# Patient Record
Sex: Male | Born: 1935 | Race: White | Hispanic: No | Marital: Married | Smoking: Former smoker
Health system: Southern US, Community
[De-identification: ages and names within clinical notes are randomized; demographics above are authoritative.]

## PROBLEM LIST (undated history)

## (undated) DIAGNOSIS — E119 Type 2 diabetes mellitus without complications: Secondary | ICD-10-CM

## (undated) DIAGNOSIS — S7290XA Unspecified fracture of unspecified femur, initial encounter for closed fracture: Secondary | ICD-10-CM

## (undated) DIAGNOSIS — F329 Major depressive disorder, single episode, unspecified: Secondary | ICD-10-CM

## (undated) DIAGNOSIS — I10 Essential (primary) hypertension: Secondary | ICD-10-CM

## (undated) DIAGNOSIS — I639 Cerebral infarction, unspecified: Secondary | ICD-10-CM

## (undated) DIAGNOSIS — G4730 Sleep apnea, unspecified: Secondary | ICD-10-CM

## (undated) DIAGNOSIS — R0600 Dyspnea, unspecified: Secondary | ICD-10-CM

## (undated) DIAGNOSIS — I2699 Other pulmonary embolism without acute cor pulmonale: Secondary | ICD-10-CM

## (undated) DIAGNOSIS — F32A Depression, unspecified: Secondary | ICD-10-CM

## (undated) DIAGNOSIS — K219 Gastro-esophageal reflux disease without esophagitis: Secondary | ICD-10-CM

## (undated) DIAGNOSIS — E785 Hyperlipidemia, unspecified: Secondary | ICD-10-CM

## (undated) DIAGNOSIS — N289 Disorder of kidney and ureter, unspecified: Secondary | ICD-10-CM

## (undated) HISTORY — PX: EYE SURGERY: SHX253

## (undated) HISTORY — DX: Unspecified fracture of unspecified femur, initial encounter for closed fracture: S72.90XA

## (undated) HISTORY — PX: ADRENAL GLAND SURGERY: SHX544

## (undated) HISTORY — PX: AORTA SURGERY: SHX548

## (undated) HISTORY — PX: VASCULAR SURGERY: SHX849

## (undated) HISTORY — PX: CHOLECYSTECTOMY: SHX55

## (undated) HISTORY — PX: CATARACT EXTRACTION W/ INTRAOCULAR LENS  IMPLANT, BILATERAL: SHX1307

## (undated) HISTORY — PX: HIP FRACTURE SURGERY: SHX118

## (undated) HISTORY — PX: PROSTATE SURGERY: SHX751

## (undated) HISTORY — PX: JOINT REPLACEMENT: SHX530

## (undated) HISTORY — PX: FEMUR FRACTURE SURGERY: SHX633

## (undated) HISTORY — PX: REPLACEMENT TOTAL KNEE: SUR1224

## (undated) HISTORY — PX: CAROTID ARTERY - SUBCLAVIAN ARTERY BYPASS GRAFT: SUR178

---

## 2004-07-12 ENCOUNTER — Ambulatory Visit: Payer: Self-pay | Admitting: Internal Medicine

## 2004-08-14 ENCOUNTER — Ambulatory Visit: Payer: Self-pay | Admitting: Specialist

## 2004-08-15 ENCOUNTER — Ambulatory Visit: Payer: Self-pay | Admitting: Internal Medicine

## 2004-09-29 ENCOUNTER — Ambulatory Visit: Payer: Self-pay | Admitting: Internal Medicine

## 2004-09-30 ENCOUNTER — Ambulatory Visit: Payer: Self-pay | Admitting: Internal Medicine

## 2004-10-12 ENCOUNTER — Ambulatory Visit: Payer: Self-pay | Admitting: Internal Medicine

## 2004-11-12 ENCOUNTER — Ambulatory Visit: Payer: Self-pay | Admitting: Internal Medicine

## 2004-11-25 ENCOUNTER — Ambulatory Visit: Payer: Self-pay | Admitting: Internal Medicine

## 2004-12-10 ENCOUNTER — Ambulatory Visit: Payer: Self-pay | Admitting: Internal Medicine

## 2004-12-22 ENCOUNTER — Ambulatory Visit: Payer: Self-pay | Admitting: Internal Medicine

## 2004-12-23 ENCOUNTER — Ambulatory Visit: Payer: Self-pay | Admitting: Internal Medicine

## 2005-01-10 ENCOUNTER — Ambulatory Visit: Payer: Self-pay | Admitting: Internal Medicine

## 2005-02-09 ENCOUNTER — Ambulatory Visit: Payer: Self-pay | Admitting: Internal Medicine

## 2005-03-12 ENCOUNTER — Ambulatory Visit: Payer: Self-pay | Admitting: Internal Medicine

## 2005-04-11 ENCOUNTER — Ambulatory Visit: Payer: Self-pay | Admitting: Internal Medicine

## 2005-05-12 ENCOUNTER — Ambulatory Visit: Payer: Self-pay | Admitting: Internal Medicine

## 2005-06-12 ENCOUNTER — Ambulatory Visit: Payer: Self-pay | Admitting: Internal Medicine

## 2005-07-16 ENCOUNTER — Ambulatory Visit: Payer: Self-pay | Admitting: Internal Medicine

## 2005-08-12 ENCOUNTER — Ambulatory Visit: Payer: Self-pay | Admitting: Internal Medicine

## 2005-09-11 ENCOUNTER — Ambulatory Visit: Payer: Self-pay | Admitting: Internal Medicine

## 2005-10-12 ENCOUNTER — Ambulatory Visit: Payer: Self-pay | Admitting: Internal Medicine

## 2005-11-12 ENCOUNTER — Ambulatory Visit: Payer: Self-pay | Admitting: Internal Medicine

## 2005-12-10 ENCOUNTER — Ambulatory Visit: Payer: Self-pay | Admitting: Internal Medicine

## 2006-02-02 ENCOUNTER — Ambulatory Visit: Payer: Self-pay | Admitting: Internal Medicine

## 2006-02-09 ENCOUNTER — Ambulatory Visit: Payer: Self-pay | Admitting: Internal Medicine

## 2006-03-04 ENCOUNTER — Ambulatory Visit: Payer: Self-pay | Admitting: Internal Medicine

## 2006-03-30 ENCOUNTER — Ambulatory Visit: Payer: Self-pay | Admitting: Internal Medicine

## 2006-04-11 ENCOUNTER — Ambulatory Visit: Payer: Self-pay | Admitting: Internal Medicine

## 2006-05-14 ENCOUNTER — Ambulatory Visit: Payer: Self-pay | Admitting: Internal Medicine

## 2006-05-18 ENCOUNTER — Ambulatory Visit: Payer: Self-pay | Admitting: Internal Medicine

## 2006-06-12 ENCOUNTER — Ambulatory Visit: Payer: Self-pay | Admitting: Internal Medicine

## 2006-07-13 ENCOUNTER — Ambulatory Visit: Payer: Self-pay | Admitting: Internal Medicine

## 2006-08-12 ENCOUNTER — Ambulatory Visit: Payer: Self-pay | Admitting: Internal Medicine

## 2006-09-11 ENCOUNTER — Ambulatory Visit: Payer: Self-pay | Admitting: Internal Medicine

## 2006-10-12 ENCOUNTER — Ambulatory Visit: Payer: Self-pay | Admitting: Internal Medicine

## 2006-11-12 ENCOUNTER — Ambulatory Visit: Payer: Self-pay | Admitting: Internal Medicine

## 2007-01-06 ENCOUNTER — Ambulatory Visit: Payer: Self-pay | Admitting: Internal Medicine

## 2007-01-11 ENCOUNTER — Ambulatory Visit: Payer: Self-pay | Admitting: Internal Medicine

## 2007-02-10 ENCOUNTER — Ambulatory Visit: Payer: Self-pay | Admitting: Internal Medicine

## 2007-02-15 ENCOUNTER — Ambulatory Visit: Payer: Self-pay | Admitting: Internal Medicine

## 2007-03-29 ENCOUNTER — Ambulatory Visit: Payer: Self-pay | Admitting: Internal Medicine

## 2007-04-02 ENCOUNTER — Other Ambulatory Visit: Payer: Self-pay

## 2007-04-02 ENCOUNTER — Emergency Department: Payer: Self-pay

## 2007-04-12 ENCOUNTER — Ambulatory Visit: Payer: Self-pay | Admitting: Internal Medicine

## 2007-05-13 ENCOUNTER — Ambulatory Visit: Payer: Self-pay | Admitting: Internal Medicine

## 2007-07-05 ENCOUNTER — Ambulatory Visit: Payer: Self-pay | Admitting: Internal Medicine

## 2007-07-11 ENCOUNTER — Ambulatory Visit: Payer: Self-pay | Admitting: Internal Medicine

## 2007-07-13 ENCOUNTER — Ambulatory Visit: Payer: Self-pay | Admitting: Internal Medicine

## 2007-08-16 ENCOUNTER — Ambulatory Visit: Payer: Self-pay | Admitting: Internal Medicine

## 2007-09-12 ENCOUNTER — Ambulatory Visit: Payer: Self-pay | Admitting: Internal Medicine

## 2007-09-21 ENCOUNTER — Ambulatory Visit: Payer: Self-pay | Admitting: Surgery

## 2007-09-27 ENCOUNTER — Ambulatory Visit: Payer: Self-pay | Admitting: Surgery

## 2007-10-18 ENCOUNTER — Ambulatory Visit: Payer: Self-pay | Admitting: Internal Medicine

## 2007-11-13 ENCOUNTER — Ambulatory Visit: Payer: Self-pay | Admitting: Internal Medicine

## 2008-01-11 ENCOUNTER — Ambulatory Visit: Payer: Self-pay | Admitting: Internal Medicine

## 2008-02-10 ENCOUNTER — Ambulatory Visit: Payer: Self-pay | Admitting: Internal Medicine

## 2008-02-22 ENCOUNTER — Ambulatory Visit: Payer: Self-pay | Admitting: Internal Medicine

## 2008-03-12 ENCOUNTER — Ambulatory Visit: Payer: Self-pay | Admitting: Internal Medicine

## 2008-03-13 ENCOUNTER — Ambulatory Visit: Payer: Self-pay | Admitting: Internal Medicine

## 2008-04-11 ENCOUNTER — Ambulatory Visit: Payer: Self-pay | Admitting: Internal Medicine

## 2008-05-12 ENCOUNTER — Ambulatory Visit: Payer: Self-pay | Admitting: Internal Medicine

## 2008-06-07 ENCOUNTER — Ambulatory Visit: Payer: Self-pay | Admitting: Internal Medicine

## 2008-07-03 ENCOUNTER — Ambulatory Visit: Payer: Self-pay | Admitting: Internal Medicine

## 2008-07-12 ENCOUNTER — Ambulatory Visit: Payer: Self-pay | Admitting: Internal Medicine

## 2008-08-12 ENCOUNTER — Ambulatory Visit: Payer: Self-pay | Admitting: Internal Medicine

## 2008-09-04 ENCOUNTER — Ambulatory Visit: Payer: Self-pay | Admitting: Internal Medicine

## 2008-09-11 ENCOUNTER — Ambulatory Visit: Payer: Self-pay | Admitting: Internal Medicine

## 2008-11-12 ENCOUNTER — Ambulatory Visit: Payer: Self-pay | Admitting: Internal Medicine

## 2008-11-20 ENCOUNTER — Ambulatory Visit: Payer: Self-pay | Admitting: Physician Assistant

## 2008-12-03 ENCOUNTER — Ambulatory Visit: Payer: Self-pay | Admitting: Internal Medicine

## 2008-12-10 ENCOUNTER — Ambulatory Visit: Payer: Self-pay | Admitting: Internal Medicine

## 2009-01-10 ENCOUNTER — Ambulatory Visit: Payer: Self-pay | Admitting: Internal Medicine

## 2009-01-31 ENCOUNTER — Ambulatory Visit: Payer: Self-pay | Admitting: Internal Medicine

## 2009-02-09 ENCOUNTER — Ambulatory Visit: Payer: Self-pay | Admitting: Internal Medicine

## 2009-02-14 ENCOUNTER — Ambulatory Visit: Payer: Self-pay | Admitting: Internal Medicine

## 2009-02-27 ENCOUNTER — Ambulatory Visit: Payer: Self-pay | Admitting: Urology

## 2009-03-12 ENCOUNTER — Ambulatory Visit: Payer: Self-pay | Admitting: Internal Medicine

## 2009-04-11 ENCOUNTER — Ambulatory Visit: Payer: Self-pay | Admitting: Internal Medicine

## 2009-05-12 ENCOUNTER — Ambulatory Visit: Payer: Self-pay | Admitting: Internal Medicine

## 2009-05-23 ENCOUNTER — Ambulatory Visit: Payer: Self-pay | Admitting: Internal Medicine

## 2010-05-27 ENCOUNTER — Ambulatory Visit: Payer: Self-pay | Admitting: Internal Medicine

## 2011-06-02 ENCOUNTER — Ambulatory Visit: Payer: Self-pay | Admitting: Internal Medicine

## 2011-12-12 ENCOUNTER — Other Ambulatory Visit: Payer: Self-pay | Admitting: Internal Medicine

## 2011-12-12 LAB — PROTIME-INR
INR: 3.6
Prothrombin Time: 35.8 secs — ABNORMAL HIGH (ref 11.5–14.7)

## 2012-02-12 ENCOUNTER — Ambulatory Visit: Payer: Self-pay | Admitting: Nephrology

## 2012-05-10 ENCOUNTER — Other Ambulatory Visit: Payer: Self-pay | Admitting: Podiatry

## 2012-05-10 LAB — BODY FLUID CELL COUNT WITH DIFFERENTIAL
Basophil: 0 %
Eosinophil: 0 %
Lymphocytes: 3 %
Neutrophils: 86 %
Nucleated Cell Count: 17753 /mm3
Other Cells BF: 0 %
Other Mononuclear Cells: 11 %

## 2012-05-10 LAB — SYNOVIAL FLUID, CRYSTAL

## 2012-06-08 ENCOUNTER — Ambulatory Visit: Payer: Self-pay | Admitting: Internal Medicine

## 2012-11-15 ENCOUNTER — Ambulatory Visit: Payer: Self-pay | Admitting: General Practice

## 2012-11-21 ENCOUNTER — Ambulatory Visit: Payer: Self-pay | Admitting: Orthopedic Surgery

## 2012-11-21 LAB — SEDIMENTATION RATE: Erythrocyte Sed Rate: 8 mm/hr (ref 0–20)

## 2012-11-21 LAB — BASIC METABOLIC PANEL
BUN: 48 mg/dL — ABNORMAL HIGH (ref 7–18)
Creatinine: 2.1 mg/dL — ABNORMAL HIGH (ref 0.60–1.30)
EGFR (African American): 34 — ABNORMAL LOW

## 2012-11-21 LAB — CBC
HCT: 44.4 % (ref 40.0–52.0)
HGB: 15 g/dL (ref 13.0–18.0)
MCHC: 33.8 g/dL (ref 32.0–36.0)
MCV: 90 fL (ref 80–100)
RBC: 4.96 10*6/uL (ref 4.40–5.90)
WBC: 11.2 10*3/uL — ABNORMAL HIGH (ref 3.8–10.6)

## 2012-12-08 ENCOUNTER — Inpatient Hospital Stay: Payer: Self-pay | Admitting: Orthopedic Surgery

## 2012-12-08 LAB — PROTIME-INR: Prothrombin Time: 13.1 secs (ref 11.5–14.7)

## 2012-12-08 LAB — POTASSIUM: Potassium: 4.3 mmol/L (ref 3.5–5.1)

## 2012-12-09 LAB — PROTIME-INR
INR: 1.1
Prothrombin Time: 13.9 secs (ref 11.5–14.7)

## 2012-12-09 LAB — BASIC METABOLIC PANEL
Anion Gap: 5 — ABNORMAL LOW (ref 7–16)
BUN: 26 mg/dL — ABNORMAL HIGH (ref 7–18)
Calcium, Total: 8.2 mg/dL — ABNORMAL LOW (ref 8.5–10.1)
Chloride: 104 mmol/L (ref 98–107)
Co2: 30 mmol/L (ref 21–32)
EGFR (Non-African Amer.): 41 — ABNORMAL LOW
Osmolality: 285 (ref 275–301)
Sodium: 139 mmol/L (ref 136–145)

## 2012-12-10 LAB — CBC WITH DIFFERENTIAL/PLATELET
Eosinophil %: 0.8 %
HCT: 30.4 % — ABNORMAL LOW (ref 40.0–52.0)
HGB: 10.4 g/dL — ABNORMAL LOW (ref 13.0–18.0)
Lymphocyte %: 10.5 %
MCH: 31.2 pg (ref 26.0–34.0)
Monocyte %: 12 %
Neutrophil %: 76.4 %
Platelet: 108 10*3/uL — ABNORMAL LOW (ref 150–440)
RDW: 14.4 % (ref 11.5–14.5)

## 2012-12-10 LAB — BASIC METABOLIC PANEL
Anion Gap: 6 — ABNORMAL LOW (ref 7–16)
BUN: 29 mg/dL — ABNORMAL HIGH (ref 7–18)
Calcium, Total: 8.3 mg/dL — ABNORMAL LOW (ref 8.5–10.1)
Creatinine: 1.84 mg/dL — ABNORMAL HIGH (ref 0.60–1.30)
EGFR (African American): 40 — ABNORMAL LOW
EGFR (Non-African Amer.): 34 — ABNORMAL LOW
Sodium: 139 mmol/L (ref 136–145)

## 2012-12-10 LAB — PROTIME-INR
INR: 1.4
Prothrombin Time: 17 secs — ABNORMAL HIGH (ref 11.5–14.7)

## 2012-12-11 LAB — PROTIME-INR
INR: 1.7
Prothrombin Time: 20.1 secs — ABNORMAL HIGH (ref 11.5–14.7)

## 2012-12-12 LAB — BASIC METABOLIC PANEL
Anion Gap: 6 — ABNORMAL LOW (ref 7–16)
BUN: 36 mg/dL — ABNORMAL HIGH (ref 7–18)
Calcium, Total: 8.6 mg/dL (ref 8.5–10.1)
Chloride: 103 mmol/L (ref 98–107)
Co2: 28 mmol/L (ref 21–32)
EGFR (African American): 42 — ABNORMAL LOW
EGFR (Non-African Amer.): 36 — ABNORMAL LOW
Glucose: 130 mg/dL — ABNORMAL HIGH (ref 65–99)
Potassium: 4.4 mmol/L (ref 3.5–5.1)

## 2012-12-12 LAB — PROTIME-INR
INR: 2.3
Prothrombin Time: 24.5 secs — ABNORMAL HIGH (ref 11.5–14.7)

## 2012-12-12 LAB — CBC WITH DIFFERENTIAL/PLATELET
Basophil #: 0 10*3/uL (ref 0.0–0.1)
Eosinophil #: 0.1 10*3/uL (ref 0.0–0.7)
Eosinophil %: 1.3 %
HCT: 31.1 % — ABNORMAL LOW (ref 40.0–52.0)
Lymphocyte #: 0.9 10*3/uL — ABNORMAL LOW (ref 1.0–3.6)
MCH: 30.9 pg (ref 26.0–34.0)
MCV: 91 fL (ref 80–100)
Monocyte #: 1.1 x10 3/mm — ABNORMAL HIGH (ref 0.2–1.0)
Neutrophil %: 79.7 %
Platelet: 161 10*3/uL (ref 150–440)
RBC: 3.41 10*6/uL — ABNORMAL LOW (ref 4.40–5.90)

## 2012-12-13 ENCOUNTER — Encounter: Payer: Self-pay | Admitting: Internal Medicine

## 2014-03-06 ENCOUNTER — Ambulatory Visit: Payer: Self-pay | Admitting: Physical Medicine and Rehabilitation

## 2014-10-22 ENCOUNTER — Ambulatory Visit: Payer: Self-pay | Admitting: Orthopedic Surgery

## 2015-02-01 NOTE — Discharge Summary (Signed)
PATIENT NAME:  Gary Thomas, Gary Thomas MR#:  [id number] DATE OF BIRTH:  12/15/1931  DATE OF ADMISSION:  12/08/2012 DATE OF DISCHARGE:  12/12/2012  ADMITTING DIAGNOSIS: Right knee degenerative arthrosis.   DISCHARGE DIAGNOSES: Right knee degenerative arthrosis.  OPERATION: On 12/08/2012 the patient had right total knee replacement.   ANESTHESIA: Spinal.   SURGEON: Kennedy Bucker, M.D.   ASSISTANT: Cranston Neighbor, PA-C.   ESTIMATED BLOOD LOSS: 50 mL.  TOURNIQUET TIME: 64 minutes.   IMPLANTS: Medacta GMK primary total knee system, 5 right standard femur, 5 right fixed bearing tibial component, a 10 mm UC size 5 tibial insert and a size 3 patella.   The patient was stabilized and brought to the recovery room and then brought down to the orthopedic floor for pain control and physical therapy.   HISTORY OF PRESENT ILLNESS: The patient is a 81 year old male who presented for right knee arthritis with medial joint collapse. The patient has had difficulty with walking and activities of daily living. The patient has had multiple injections without any relief.   PHYSICAL EXAMINATION: GENERAL: Alert male with some difficulty ambulating. The patient does have range of motion of 5 degrees from extension to 100 degrees of flexion. The patient has mild fullness and difficulty with manipulation of the knee.   HOSPITAL COURSE: After initial admission on 12/08/2012, the patient was brought to the orthopedic floor. The patient has a urostomy and Foley, which was left in place. On postop day 1, the patient's hemoglobin was 12.3. On postop day 2, the patient's hemoglobin dropped down to 10.4 and remained stable there with no transfusions given. The patient did have significant nausea and vomiting secondary to oxycodone. The patient was placed on Anzemet as well as was given scopolamine patches and seemed to tolerate these well, as well as with change to Nucynta. The patient ambulated only bed to chair, up to about 3  feet, and was progressing slowly with physical therapy. The patient went to skilled nursing on 12/12/2012 with no other complications.   DISCHARGE INSTRUCTIONS:  The patient will follow up with [facility] in about 2 weeks for staple removal. The patient will have dressing change on a p.r.n. basis. The patient will do weight bear as tolerated. The patient will use 1 to 2 pillows for elevating the leg. The patient will use thigh-high TED hose on both legs, removed 1 hour before the 8 hour shift. The patient will be encouraged to do cough and deep breathing. The patient will do a regular diet. The patient will use Polar Care and try to decrease temperature to 40 to 50 degrees. The patient will try to keep the dressing clean and dry. Dressing will be changed on a p.r.n. basis. They will watch for any bright red along the incision, any bright red bleeding as well. The patient will call the clinic if there is any fever greater than 101.5 or any bowel or bladder difficulty, as per skilled nursing. The patient will do physical therapy per protocol and occupational therapy per protocol. The patient has oxygen, which they will continue to wean off. The patient is to follow up with Dr. Judithann Sheen in the office in 1 to 2 weeks.   DISCHARGE MEDICATIONS: 1.  Uloric 40 mg 1 tablet daily. 2.  Ocuvite antioxidant vitamin 1 capsule p.o. daily.  3.  Multivitamin with minerals 1 tablet p.o. daily.  4.  Tylenol 1 tablet q. 4 hours p.r.n. for pain or fever greater than 100.4.  5.  Nucynta 50 mg 1 tablet q. 4 hours as needed for pain.  6.  Magnesium hydroxide 8% oral suspension 30 mL b.i.d. p.r.n. for constipation.  7.  Aluminum hydroxide magnesium 30 mL q. 6 hours p.r.n. for indigestion. 8.  Warfarin 7.5 mg p.o. daily. 9.  Sertraline 50 mg at bedtime.  10.  Insulin regular 100 units/mL injectable insulin. 11.  Isophane insulin regular 70/30 units/mL subcutaneous. 12.  Ondansetron 4 mg p.o. tablets 1 tablet q.  4 to 6 hours p.r.n. for nausea. 13.  Scopolamine patch topically q. 3 days for nausea. 14.  Atorvastatin 20 mg p.o. daily.  15.  Ambien 5 mg at bedtime s needed for sleep depravation.  16.  Metoprolol 100 mg p.o. daily.  17.  Albuterol inhalation 2.5/0.5 mg inhaled. 18.  Furosemide 40 mg p.o. daily.  19.  Bisacodyl 10 mg per rectal suppository p.r.n. for constipation. ____________________________ Shela CommonsJ. Dedra Skeensodd Antonya Leeder, GeorgiaPA jtm:sb D: 12/12/2012 08:01:58 ET T: 12/12/2012 08:40:43 ET JOB#: [id number]  cc: J. Dedra Skeensodd Connery Shiffler, GeorgiaPA, <Dictator> J Other [facility]UN[facility] PA ELECTRONICALLY SIGNED 12/21/2012 13:23

## 2015-02-01 NOTE — Op Note (Signed)
  DATE OF BIRTH:  09-Feb-1932  DATE OF PROCEDURE:  12/08/2012  PREOPERATIVE DIAGNOSIS:  Right knee degenerative osteoarthritis.   POSTOPERATIVE DIAGNOSIS:  Right knee degenerative osteoarthritis.   PROCEDURE:  Right total knee replacement.   ANESTHESIA:  Spinal.   SURGEON: Kennedy BuckerMichael Kyah Buesing, MD   ASSISTANT:  Cranston Neighborhris Gaines, PA-C   DESCRIPTION OF PROCEDURE: The patient was brought to the Operating Room and after adequate anesthesia was obtained, the right leg was prepped and draped in the usual sterile fashion, with a tourniquet applied to the upper thigh. Alvarado leg holder was used during the procedure. After prepping and draping the leg in the usual sterile fashion, appropriate patient identification and timeout procedures were completed. The leg was exsanguinated with an  Esmarch, and the tourniquet raised to 300 mmHg. A midline skin incision was made, followed by a medial parapatellar arthrotomy. Inspection revealed exposed bone on the femoral condyle medially and tibial condyle, and significant degenerative change to the patellofemoral joint with exposed bone. The lateral compartment had moderate changes. The fat pad and ACL were excised along with the anterior horns of the meniscus. Proximal exposure carried out until the proximal cutting block could be applied, and the proximal tibia cut carried out, preserving the PCL. After this was completed, the posterior horns of the menisci were excised, and the femoral cutting block was applied. Anterior, posterior and chamfer cuts made without any notching. The 5 trial in the tibia was placed, pinning it in place, performing the proximal drill hole, and then the notch V cut in the proximal tibia for rotational stability. A 10 mm trial inserted, followed by placement of the #5 femoral component, with excellent stability in flexion and extension and mid flexion. The patella was then cut with a cutting guide, drilled and measured a size 3, with the 3 trialed.   The patella tracked well with no touch technique. At this point, the distal femoral drill holes were made along with the notch in the femoral trochlea. All trial components were removed. The bony surfaces thoroughly irrigated and dried. Local anesthetic was infiltrated in the posterior knee with long-acting local being given. The bony surfaces were thoroughly dried and the tibial component was cemented in place first, with the tibial polyethylene then placed, followed by the femoral component. The knee held in extension and the patellar button clamped into place with antibiotic cement being used with a history of diabetes. After the cement had set, all excess cement was removed and the knee again thoroughly irrigated. The tourniquet was let down. Hemostasis checked with electrocautery. The arthrotomy repaired using a heavy quill suture, 2-0 quill subcutaneously, skin staples. Xeroform, 4 x 4s, ABD, Webril, Polar Care and Ace wrap. The patient was sent to recovery room in stable condition.  EBL was 50 mL, tourniquet time was 64 minutes at 300 mmHg.  IMPLANTS:  Medacta GMK primary total knee system, 5 right standard femur, 5 right fixed bearing tibia component, a 10 mm UC size 5 tibial insert and a size 3 patella. Again, all components cemented with Exparel used as local anesthetic in the posterior capsule along the arthrotomy.   SPECIMEN: Cut ends of bone.   CONDITION:  To recovery room, stable.     ____________________________ Leitha SchullerMichael J. Shandie Bertz, MD mjm:mr D: 12/08/2012 17:00:43 ET T: 12/08/2012 20:44:32 ET JOB#: [id number]  cc: Leitha SchullerMichael J. Khyri Hinzman, MD, <Dictator> Leitha SchullerMICHAEL J Cranford Blessinger MD ELECTRONICALLY SIGNED 12/09/2012 8:12

## 2015-02-01 NOTE — Consult Note (Signed)
PATIENT NAME:  Gary Thomas, Gary Thomas MR#:  [id number] DATE OF BIRTH:  1931-12-07  DATE OF CONSULTATION:  12/08/2012  REFERRING PHYSICIAN:  Leitha Schuller, MD CONSULTING PHYSICIAN:  Demarquez Ciolek A. Allena Katz, MD PRIMARY CARE PHYSICIAN: Duane Lope. Judithann Sheen, MD  REASON FOR CONSULTATION: Postop medical management.   HISTORY OF PRESENT ILLNESS: The patient is a pleasant 81 year old, obese Caucasian gentleman with history of polycythemia, prostate cancer, type 2 diabetes, was admitted for elective right total knee replacement. He underwent right total knee replacement by Dr. Rosita Kea this afternoon secondary to severe degenerative joint disease. The patient tolerated the procedure well. Internal medicine was consulted for postop medical management along with management of diabetes. The patient's most recent sugar in the evening was 103. He ate a good supper. His blood glucose after surgery was 92. Denies any complaints other than pain on the surgical knee.   PAST MEDICAL HISTORY:  1.  Polycythemia vera.  2.  History of prostate cancer.  3.  History of CVA/stroke.  4.  Type 2 diabetes.  5.  History of A. fib, now resolved, in sinus rhythm.  6.  History of pulmonary embolus, on Coumadin. 7.  CKD stage III.  8.  Low testosterone level.  9.  Anxiety, depression.  10.  Bilateral hip fractures.  11.  Severe sleep apnea, status post uvulopalatopharyngoplasty/nasal and tongue pexy.   PAST SURGICAL HISTORY:  1.  Surgery for prostate cancer.  2.  Bilateral carotid artery surgery.  3.  Redo left and right carotid artery surgery.  4.  Appendectomy.  5.  Cholecystectomy.  6.  Status post UPPP secondary to severe sleep apnea.   MEDICATIONS:  1.  Uses BiPAP.  2.  Insulin 70/30, 30 units b.i.d.  3.  Ocuvite, lutein, antioxidant multivitamins and mineral oil capsule daily.  4.  Sertraline 50 mg at bedtime.  5.  Uloric 40 mg daily.  6.  Warfarin 7.5 mg daily in the evening.  7.  Metoprolol ER 100 mg daily.  8.  Furosemide  40 mg daily.  9.  Atorvastatin 20 mg at bedtime.   ALLERGIES: CODEINE, LYRICA, MORPHINE AND STADOL.   FAMILY HISTORY: Positive for hypertension.   SOCIAL HISTORY: Denies use of alcohol or tobacco use.   REVIEW OF SYSTEMS:    CONSTITUTIONAL: No fever, fatigue, weakness.  EYES: No blurred or double vision.  ENT: No tinnitus, ear pain, hearing loss.  RESPIRATORY: No cough, wheeze, hemoptysis.  CARDIOVASCULAR: No chest pain, orthopnea, edema.  GASTROINTESTINAL: No nausea, vomiting, diarrhea or abdominal pain.  GENITOURINARY: No dysuria or hematuria.  ENDOCRINE: No polyuria, nocturia or thyroid problems.  GENITOURINARY: No dysuria or hematuria. NEUROLOGIC: No CVA, TIA.  PSYCHIATRIC: No anxiety or depression.  SKIN: No rash or ecchymosis. All other systems reviewed and negative.   PHYSICAL EXAMINATION:  GENERAL: The patient is awake, alert, oriented x 3, not in acute distress.  VITAL SIGNS: Afebrile, pulse 65, blood pressure 160/83, sats 93% on 1 liter.  HEENT: Atraumatic, normocephalic. Pupils: PERRLA. EOM intact. Oral mucosa is moist.  NECK: Supple. No JVD. No carotid bruit.  RESPIRATORY: Clear to auscultation bilaterally. No rales, rhonchi, respiratory distress or labored breathing.  CARDIOVASCULAR: Both the heart sounds are normal. Rate and rhythm regular. PMI not lateralized. Chest nontender. Good pedal pulses, good femoral pulses. No lower extremity edema.  ABDOMEN: Soft, benign, nontender. No organomegaly. Positive bowel sounds.  EXTREMITIES: The patient's right lower extremity has surgical knee dressing present.  NEUROLOGIC: Grossly intact cranial nerves II through XII.  No motor or sensory deficits.  PSYCHIATRIC: The patient is awake, alert, oriented x 3.  SKIN: Warm and dry.   LABORATORY AND DIAGNOSTIC DATA: Postop glucose is 92 and 103. Potassium is 4.3. PT/INR is 13.1 and 1.0. EKG is normal sinus rhythm.   ASSESSMENT AND PLAN: A 81 year old patient with multiple medical  problems including history of hypertension, atrial fibrillation, history of pulmonary embolus, severe degenerative joint disease, is postoperative from right knee arthroplasty by Dr. Rosita KeaMenz which was done on February 27. Internal medicine is consulted for:  1.  Postoperative medical management of diabetes. The patient takes insulin 70/30, 30 units twice daily. Will start tomorrow morning along with sliding scale. Postoperative sugars have been well-controlled at 92 and 103 respectively. He follows up with Dr. Tedd SiasSolum as outpatient.  2.  Chronic anticoagulation due to history of pulmonary embolus. Coumadin has been resumed by orthopedics. Will check PT/INR daily.  3.  Chronic kidney disease stage III. Currently getting IV fluids. Follow up BMP in the morning. Avoid nephrotoxins and sudden drop in blood pressure.  4.  Hypertension. Stable at this time. Resumed metoprolol.  5.  Dyslipidemia. Continue atorvastatin.  6.  Deep vein prophylaxis. The patient is already on Coumadin.  7.  Further workup according to the patient's clinical course.   Thank you for the consult. Will follow while the patient is in-house. The patient's care will be assumed by Dr. Judithann SheenSparks in the morning.   TIME SPENT: 50 minutes.   ____________________________ Wylie HailSona A. Allena KatzPatel, MD sap:jm D: 12/08/2012 19:32:38 ET T: 12/08/2012 20:31:03 ET JOB#: [id number]  cc: Junell Cullifer A. Allena KatzPatel, MD, <Dictator> Willow OraSONA A Leba Tibbitts MD ELECTRONICALLY SIGNED 12/09/2012 12:49

## 2015-02-01 NOTE — Consult Note (Signed)
PCP Dr Dwaine GaleSparksDr Menz Post op medical mn of Dm-2takes insulin 70/30 30 units bidstart from tomorrow am with SSIwith dr Tedd Siassolum Chronic anticoagulation due to h/o PEresumed by ortho IVFBMP in amnephrotooxins HTN-on metoprolol atorvastatin  Electronic Signatures: Willow OraPatel, Asencion Loveday A (MD)  (Signed on 27-Feb-14 19:25)  Authored  Last Updated: 27-Feb-14 19:25 by Willow OraPatel, Nik Gorrell A (MD)

## 2015-12-27 ENCOUNTER — Other Ambulatory Visit: Payer: Self-pay | Admitting: Neurology

## 2015-12-27 DIAGNOSIS — Z8673 Personal history of transient ischemic attack (TIA), and cerebral infarction without residual deficits: Secondary | ICD-10-CM

## 2016-01-16 ENCOUNTER — Ambulatory Visit
Admission: RE | Admit: 2016-01-16 | Discharge: 2016-01-16 | Disposition: A | Discharge status: Home / Self Care | Payer: Medicare Other | Source: Ambulatory Visit | Attending: Neurology | Admitting: Neurology

## 2016-01-16 DIAGNOSIS — Z8673 Personal history of transient ischemic attack (TIA), and cerebral infarction without residual deficits: Secondary | ICD-10-CM | POA: Diagnosis present

## 2016-01-16 DIAGNOSIS — I6782 Cerebral ischemia: Secondary | ICD-10-CM | POA: Insufficient documentation

## 2016-01-16 DIAGNOSIS — G319 Degenerative disease of nervous system, unspecified: Secondary | ICD-10-CM | POA: Insufficient documentation

## 2016-01-16 DIAGNOSIS — R208 Other disturbances of skin sensation: Secondary | ICD-10-CM | POA: Diagnosis not present

## 2017-02-17 ENCOUNTER — Encounter
Admission: RE | Admit: 2017-02-17 | Discharge: 2017-02-17 | Disposition: A | Discharge status: Home / Self Care | Payer: Medicare Other | Source: Ambulatory Visit | Attending: Orthopedic Surgery | Admitting: Orthopedic Surgery

## 2017-02-17 DIAGNOSIS — Z01812 Encounter for preprocedural laboratory examination: Secondary | ICD-10-CM | POA: Insufficient documentation

## 2017-02-17 DIAGNOSIS — I10 Essential (primary) hypertension: Secondary | ICD-10-CM

## 2017-02-17 DIAGNOSIS — E7800 Pure hypercholesterolemia, unspecified: Secondary | ICD-10-CM | POA: Insufficient documentation

## 2017-02-17 DIAGNOSIS — Z01810 Encounter for preprocedural cardiovascular examination: Secondary | ICD-10-CM | POA: Diagnosis present

## 2017-02-17 DIAGNOSIS — K219 Gastro-esophageal reflux disease without esophagitis: Secondary | ICD-10-CM | POA: Insufficient documentation

## 2017-02-17 DIAGNOSIS — Z8673 Personal history of transient ischemic attack (TIA), and cerebral infarction without residual deficits: Secondary | ICD-10-CM | POA: Diagnosis not present

## 2017-02-17 DIAGNOSIS — Z86711 Personal history of pulmonary embolism: Secondary | ICD-10-CM | POA: Diagnosis not present

## 2017-02-17 DIAGNOSIS — E119 Type 2 diabetes mellitus without complications: Secondary | ICD-10-CM | POA: Insufficient documentation

## 2017-02-17 HISTORY — DX: Disorder of kidney and ureter, unspecified: N28.9

## 2017-02-17 HISTORY — DX: Gastro-esophageal reflux disease without esophagitis: K21.9

## 2017-02-17 HISTORY — DX: Other pulmonary embolism without acute cor pulmonale: I26.99

## 2017-02-17 HISTORY — DX: Depression, unspecified: F32.A

## 2017-02-17 HISTORY — DX: Type 2 diabetes mellitus without complications: E11.9

## 2017-02-17 HISTORY — DX: Hyperlipidemia, unspecified: E78.5

## 2017-02-17 HISTORY — DX: Essential (primary) hypertension: I10

## 2017-02-17 HISTORY — DX: Cerebral infarction, unspecified: I63.9

## 2017-02-17 HISTORY — DX: Major depressive disorder, single episode, unspecified: F32.9

## 2017-02-17 HISTORY — DX: Dyspnea, unspecified: R06.00

## 2017-02-17 LAB — BASIC METABOLIC PANEL
Anion gap: 7 (ref 5–15)
BUN: 38 mg/dL — AB (ref 6–20)
CO2: 27 mmol/L (ref 22–32)
Calcium: 9.4 mg/dL (ref 8.9–10.3)
Chloride: 104 mmol/L (ref 101–111)
Creatinine, Ser: 1.99 mg/dL — ABNORMAL HIGH (ref 0.61–1.24)
GFR calc Af Amer: 34 mL/min — ABNORMAL LOW (ref >60)
GFR calc non Af Amer: 29 mL/min — ABNORMAL LOW (ref >60)
GLUCOSE: 144 mg/dL — AB (ref 65–99)
POTASSIUM: 4.4 mmol/L (ref 3.5–5.1)
Sodium: 138 mmol/L (ref 135–145)

## 2017-02-17 LAB — URINALYSIS, COMPLETE (UACMP) WITH MICROSCOPIC
BILIRUBIN URINE: NEGATIVE
GLUCOSE, UA: NEGATIVE mg/dL
Hgb urine dipstick: NEGATIVE
KETONES UR: NEGATIVE mg/dL
NITRITE: NEGATIVE
PH: 8 (ref 5.0–8.0)
Protein, ur: 100 mg/dL — AB
Specific Gravity, Urine: 1.015 (ref 1.005–1.030)

## 2017-02-17 LAB — CBC
HCT: 40.9 % (ref 40.0–52.0)
HEMOGLOBIN: 13.5 g/dL (ref 13.0–18.0)
MCH: 28.3 pg (ref 26.0–34.0)
MCHC: 33.1 g/dL (ref 32.0–36.0)
MCV: 85.6 fL (ref 80.0–100.0)
PLATELETS: 171 10*3/uL (ref 150–440)
RBC: 4.78 MIL/uL (ref 4.40–5.90)
RDW: 14.7 % — AB (ref 11.5–14.5)
WBC: 10.1 10*3/uL (ref 3.8–10.6)

## 2017-02-17 LAB — APTT: aPTT: 31 seconds (ref 24–36)

## 2017-02-17 LAB — PROTIME-INR
INR: 1.24
Prothrombin Time: 15.7 seconds — ABNORMAL HIGH (ref 11.4–15.2)

## 2017-02-17 LAB — TYPE AND SCREEN
ABO/RH(D): O POS
Antibody Screen: NEGATIVE

## 2017-02-17 LAB — SEDIMENTATION RATE: SED RATE: 17 mm/h (ref 0–20)

## 2017-02-17 NOTE — Patient Instructions (Signed)
Your procedure is scheduled on: 03/02/17 Tues Report to Same Day Surgery 2nd floor medical mall [facility](Medical Mall Entrance-take elevator on left to 2nd floor.  Check in with surgery information desk.) To find out your arrival time please call [phone] between 1PM - 3PM on 03/01/17 Mon  Remember: Instructions that are not followed completely may result in serious medical risk, up to and including death, or upon the discretion of your surgeon and anesthesiologist your surgery may need to be rescheduled.    _x___ 1. Do not eat food or drink liquids after midnight. No gum chewing or                              hard candies.     __x__ 2. No Alcohol for 24 hours before or after surgery.   __x__3. No Smoking for 24 prior to surgery.   ____  4. Bring all medications with you on the day of surgery if instructed.    __x__ 5. Notify your doctor if there is any change in your medical condition     (cold, fever, infections).     Do not wear jewelry, make-up, hairpins, clips or nail polish.  Do not wear lotions, powders, or perfumes. You may wear deodorant.  Do not shave 48 hours prior to surgery. Men may shave face and neck.  Do not bring valuables to the hospital.    [facility] any belongings or valuables.               Contacts, dentures or bridgework may not be worn into surgery.  Leave your suitcase in the car. After surgery it may be brought to your room.  For patients admitted to the hospital, discharge time is determined by your                       treatment team.   Patients discharged the day of surgery will not be allowed to drive home.  You will need someone to drive you home and stay with you the night of your procedure.    Please read over the following fact sheets that you were given:   [facility] Preparing [facility] and or MRSA Information   _x___ Take anti-hypertensive (unless it includes a diuretic), cardiac, seizure, asthma,     anti-reflux and  psychiatric medicines. These include:  1.   2.  3.  4.  5.  6.  ____Fleets enema or Magnesium Citrate as directed.   _x___ Use CHG Soap or sage wipes as directed on instruction sheet   ____ Use inhalers on the day of surgery and bring to hospital day of surgery  ____ Stop Metformin and Janumet 2 days prior to surgery.    ____ Take 1/2 of usual insulin dose the night before surgery and none on the morning     surgery.   _x___ Follow recommendations from Cardiologist, Pulmonologist or PCP regarding          stopping Aspirin, Coumadin, Pllavix ,Eliquis, Effient, or Pradaxa, and Pletal. Stop Xarelto 3 days before surgery if OK with physician  X____Stop Anti-inflammatories such as Advil, Aleve, Ibuprofen, Motrin, Naproxen, Naprosyn, Goodies powders or aspirin products. OK to take Tylenol and                          Celebrex.   _x___ Stop supplements until  after surgery.  But may continue Vitamin D, Vitamin B,       and multivitamin.   __x__ Bring C-Pap to the hospital.

## 2017-02-18 NOTE — Pre-Procedure Instructions (Addendum)
Unable to reach patient,  phone number no longer active.  Called [facility] and obtained another number.  Patient notified of need for MRSA nasal swab.

## 2017-02-19 ENCOUNTER — Encounter
Admission: RE | Admit: 2017-02-19 | Discharge: 2017-02-19 | Disposition: A | Discharge status: Home / Self Care | Payer: Medicare Other | Source: Ambulatory Visit | Attending: Orthopedic Surgery | Admitting: Orthopedic Surgery

## 2017-02-19 DIAGNOSIS — Z01812 Encounter for preprocedural laboratory examination: Secondary | ICD-10-CM | POA: Diagnosis not present

## 2017-02-19 LAB — URINE CULTURE

## 2017-02-19 LAB — SURGICAL PCR SCREEN
MRSA, PCR: NEGATIVE
Staphylococcus aureus: NEGATIVE

## 2017-02-22 NOTE — Pre-Procedure Instructions (Signed)
[facility]

## 2017-03-01 MED ORDER — CEFAZOLIN SODIUM-DEXTROSE 2-4 GM/100ML-% IV SOLN: 2.0000 g | Freq: Once | INTRAVENOUS | Status: DC

## 2017-03-02 ENCOUNTER — Inpatient Hospital Stay
Admission: RE | Admit: 2017-03-02 | Discharge: 2017-03-05 | DRG: 470 | Disposition: A | Discharge status: Skilled Nursing Facility | Payer: Medicare Other | Source: Ambulatory Visit | Attending: Orthopedic Surgery | Admitting: Orthopedic Surgery

## 2017-03-02 ENCOUNTER — Inpatient Hospital Stay: Admission: RE | Admit: 2017-03-02 | Payer: Medicare Other | Source: Ambulatory Visit | Admitting: Orthopedic Surgery

## 2017-03-02 ENCOUNTER — Encounter
Admission: RE | Disposition: A | Discharge status: Skilled Nursing Facility | Payer: Self-pay | Source: Ambulatory Visit | Attending: Orthopedic Surgery

## 2017-03-02 ENCOUNTER — Encounter: Payer: Self-pay | Admitting: *Deleted

## 2017-03-02 ENCOUNTER — Inpatient Hospital Stay: Payer: Medicare Other

## 2017-03-02 ENCOUNTER — Inpatient Hospital Stay: Payer: Medicare Other | Admitting: Anesthesiology

## 2017-03-02 ENCOUNTER — Encounter: Admission: RE | Payer: Self-pay | Source: Ambulatory Visit

## 2017-03-02 DIAGNOSIS — E119 Type 2 diabetes mellitus without complications: Secondary | ICD-10-CM | POA: Diagnosis present

## 2017-03-02 DIAGNOSIS — Z8673 Personal history of transient ischemic attack (TIA), and cerebral infarction without residual deficits: Secondary | ICD-10-CM

## 2017-03-02 DIAGNOSIS — Z86711 Personal history of pulmonary embolism: Secondary | ICD-10-CM | POA: Diagnosis not present

## 2017-03-02 DIAGNOSIS — E875 Hyperkalemia: Secondary | ICD-10-CM | POA: Diagnosis present

## 2017-03-02 DIAGNOSIS — I10 Essential (primary) hypertension: Secondary | ICD-10-CM | POA: Diagnosis present

## 2017-03-02 DIAGNOSIS — I959 Hypotension, unspecified: Secondary | ICD-10-CM | POA: Diagnosis present

## 2017-03-02 DIAGNOSIS — F329 Major depressive disorder, single episode, unspecified: Secondary | ICD-10-CM | POA: Diagnosis present

## 2017-03-02 DIAGNOSIS — M1611 Unilateral primary osteoarthritis, right hip: Secondary | ICD-10-CM | POA: Diagnosis present

## 2017-03-02 DIAGNOSIS — D62 Acute posthemorrhagic anemia: Secondary | ICD-10-CM | POA: Diagnosis not present

## 2017-03-02 DIAGNOSIS — Z419 Encounter for procedure for purposes other than remedying health state, unspecified: Secondary | ICD-10-CM

## 2017-03-02 DIAGNOSIS — G8918 Other acute postprocedural pain: Secondary | ICD-10-CM

## 2017-03-02 DIAGNOSIS — K219 Gastro-esophageal reflux disease without esophagitis: Secondary | ICD-10-CM | POA: Diagnosis present

## 2017-03-02 HISTORY — PX: TOTAL HIP ARTHROPLASTY: SHX124

## 2017-03-02 LAB — CBC
HCT: 36.4 % — ABNORMAL LOW (ref 40.0–52.0)
Hemoglobin: 11.8 g/dL — ABNORMAL LOW (ref 13.0–18.0)
MCH: 27.5 pg (ref 26.0–34.0)
MCHC: 32.5 g/dL (ref 32.0–36.0)
MCV: 84.8 fL (ref 80.0–100.0)
PLATELETS: 156 10*3/uL (ref 150–440)
RBC: 4.29 MIL/uL — ABNORMAL LOW (ref 4.40–5.90)
RDW: 14.7 % — AB (ref 11.5–14.5)
WBC: 13.4 10*3/uL — AB (ref 3.8–10.6)

## 2017-03-02 LAB — CREATININE, SERUM
Creatinine, Ser: 1.66 mg/dL — ABNORMAL HIGH (ref 0.61–1.24)
GFR calc Af Amer: 42 mL/min — ABNORMAL LOW (ref >60)
GFR calc non Af Amer: 37 mL/min — ABNORMAL LOW (ref >60)

## 2017-03-02 LAB — ABO/RH: ABO/RH(D): O POS

## 2017-03-02 LAB — GLUCOSE, CAPILLARY
GLUCOSE-CAPILLARY: 131 mg/dL — AB (ref 65–99)
Glucose-Capillary: 123 mg/dL — ABNORMAL HIGH (ref 65–99)
Glucose-Capillary: 97 mg/dL (ref 65–99)

## 2017-03-02 SURGERY — ARTHROPLASTY, HIP, TOTAL, ANTERIOR APPROACH
Anesthesia: Spinal | Site: Hip | Laterality: Right | Wound class: Clean

## 2017-03-02 SURGERY — ARTHROPLASTY, HIP, TOTAL, ANTERIOR APPROACH
Anesthesia: Choice | Laterality: Right

## 2017-03-02 MED ORDER — METHOCARBAMOL 500 MG PO TABS: 500.0000 mg | ORAL_TABLET | Freq: Four times a day (QID) | ORAL | Status: DC | PRN

## 2017-03-02 MED ORDER — CEFAZOLIN SODIUM-DEXTROSE 2-4 GM/100ML-% IV SOLN: 2.0000 g | Freq: Four times a day (QID) | INTRAVENOUS | Status: DC

## 2017-03-02 MED ORDER — ALUMINUM HYDROXIDE GEL 320 MG/5ML PO SUSP
15.0000 mL | ORAL | Status: DC | PRN
  Filled 2017-03-02: qty 30

## 2017-03-02 MED ORDER — METOCLOPRAMIDE HCL 10 MG PO TABS
5.0000 mg | ORAL_TABLET | Freq: Three times a day (TID) | ORAL | Status: DC | PRN
  Administered 2017-03-03: 10 mg via ORAL
  Filled 2017-03-02: qty 1

## 2017-03-02 MED ORDER — METHOCARBAMOL 1000 MG/10ML IJ SOLN
500.0000 mg | Freq: Four times a day (QID) | INTRAVENOUS | Status: DC | PRN
  Filled 2017-03-02: qty 5

## 2017-03-02 MED ORDER — DIPHENHYDRAMINE HCL 12.5 MG/5ML PO ELIX: 12.5000 mg | ORAL_SOLUTION | ORAL | Status: DC | PRN

## 2017-03-02 MED ORDER — NEOMYCIN-POLYMYXIN B GU 40-200000 IR SOLN
Status: AC
  Filled 2017-03-02: qty 4

## 2017-03-02 MED ORDER — SODIUM CHLORIDE 0.9 % IV SOLN
INTRAVENOUS | Status: DC
  Administered 2017-03-02 – 2017-03-03 (×2): via INTRAVENOUS

## 2017-03-02 MED ORDER — CEFAZOLIN SODIUM-DEXTROSE 2-3 GM-% IV SOLR
INTRAVENOUS | Status: DC | PRN
  Administered 2017-03-02: 2 g via INTRAVENOUS

## 2017-03-02 MED ORDER — MAGNESIUM HYDROXIDE 400 MG/5ML PO SUSP
30.0000 mL | Freq: Every day | ORAL | Status: DC | PRN
  Administered 2017-03-04: 30 mL via ORAL
  Filled 2017-03-02: qty 30

## 2017-03-02 MED ORDER — HYDROMORPHONE HCL 1 MG/ML IJ SOLN
1.0000 mg | INTRAMUSCULAR | Status: DC | PRN
  Administered 2017-03-02 – 2017-03-03 (×3): 1 mg via INTRAVENOUS
  Filled 2017-03-02 (×3): qty 1

## 2017-03-02 MED ORDER — LOSARTAN POTASSIUM 25 MG PO TABS
25.0000 mg | ORAL_TABLET | Freq: Every evening | ORAL | Status: DC
  Administered 2017-03-02 – 2017-03-04 (×2): 25 mg via ORAL
  Filled 2017-03-02 (×2): qty 1

## 2017-03-02 MED ORDER — PROPOFOL 500 MG/50ML IV EMUL
INTRAVENOUS | Status: DC | PRN
  Administered 2017-03-02: 100 ug/kg/min via INTRAVENOUS

## 2017-03-02 MED ORDER — FENTANYL CITRATE (PF) 100 MCG/2ML IJ SOLN
INTRAMUSCULAR | Status: AC
  Filled 2017-03-02: qty 2

## 2017-03-02 MED ORDER — INSULIN ASPART 100 UNIT/ML ~~LOC~~ SOLN
0.0000 [IU] | Freq: Three times a day (TID) | SUBCUTANEOUS | Status: DC
  Administered 2017-03-03 (×2): 3 [IU] via SUBCUTANEOUS
  Administered 2017-03-03 – 2017-03-04 (×3): 2 [IU] via SUBCUTANEOUS
  Filled 2017-03-02: qty 3
  Filled 2017-03-02: qty 2
  Filled 2017-03-02: qty 3
  Filled 2017-03-02 (×2): qty 2

## 2017-03-02 MED ORDER — MIDAZOLAM HCL 5 MG/5ML IJ SOLN
INTRAMUSCULAR | Status: DC | PRN
  Administered 2017-03-02 (×2): 0.5 mg via INTRAVENOUS

## 2017-03-02 MED ORDER — NEOMYCIN-POLYMYXIN B GU 40-200000 IR SOLN
Status: DC | PRN
  Administered 2017-03-02: 4 mL

## 2017-03-02 MED ORDER — PHENYLEPHRINE HCL 10 MG/ML IJ SOLN
INTRAMUSCULAR | Status: DC | PRN
  Administered 2017-03-02: 100 ug via INTRAVENOUS
  Administered 2017-03-02: 200 ug via INTRAVENOUS
  Administered 2017-03-02: 100 ug via INTRAVENOUS
  Administered 2017-03-02 (×3): 50 ug via INTRAVENOUS
  Administered 2017-03-02 (×2): 100 ug via INTRAVENOUS

## 2017-03-02 MED ORDER — METOPROLOL SUCCINATE ER 50 MG PO TB24
100.0000 mg | ORAL_TABLET | Freq: Every evening | ORAL | Status: DC
  Administered 2017-03-02 – 2017-03-04 (×3): 100 mg via ORAL
  Filled 2017-03-02 (×3): qty 2

## 2017-03-02 MED ORDER — BUPIVACAINE-EPINEPHRINE (PF) 0.25% -1:200000 IJ SOLN
INTRAMUSCULAR | Status: AC
  Filled 2017-03-02: qty 30

## 2017-03-02 MED ORDER — METOCLOPRAMIDE HCL 5 MG/ML IJ SOLN: 5.0000 mg | Freq: Three times a day (TID) | INTRAMUSCULAR | Status: DC | PRN

## 2017-03-02 MED ORDER — PHENOL 1.4 % MT LIQD
1.0000 | OROMUCOSAL | Status: DC | PRN
  Filled 2017-03-02: qty 177

## 2017-03-02 MED ORDER — MAGNESIUM CITRATE PO SOLN
1.0000 | Freq: Once | ORAL | Status: DC | PRN
  Filled 2017-03-02: qty 296

## 2017-03-02 MED ORDER — ONDANSETRON HCL 4 MG/2ML IJ SOLN
4.0000 mg | Freq: Four times a day (QID) | INTRAMUSCULAR | Status: DC | PRN
  Administered 2017-03-03 – 2017-03-05 (×2): 4 mg via INTRAVENOUS
  Filled 2017-03-02 (×2): qty 2

## 2017-03-02 MED ORDER — SERTRALINE HCL 100 MG PO TABS
100.0000 mg | ORAL_TABLET | Freq: Every evening | ORAL | Status: DC
  Administered 2017-03-02 – 2017-03-04 (×3): 100 mg via ORAL
  Filled 2017-03-02 (×3): qty 1

## 2017-03-02 MED ORDER — DOCUSATE SODIUM 100 MG PO CAPS
100.0000 mg | ORAL_CAPSULE | Freq: Two times a day (BID) | ORAL | Status: DC
  Administered 2017-03-02 – 2017-03-05 (×6): 100 mg via ORAL
  Filled 2017-03-02 (×6): qty 1

## 2017-03-02 MED ORDER — SODIUM CHLORIDE 0.9 % IV SOLN
INTRAVENOUS | Status: DC | PRN
  Administered 2017-03-02: 25 ug/min via INTRAVENOUS

## 2017-03-02 MED ORDER — ZOLPIDEM TARTRATE 5 MG PO TABS: 5.0000 mg | ORAL_TABLET | Freq: Every evening | ORAL | Status: DC | PRN

## 2017-03-02 MED ORDER — BUPIVACAINE-EPINEPHRINE (PF) 0.25% -1:200000 IJ SOLN
INTRAMUSCULAR | Status: DC | PRN
  Administered 2017-03-02: 30 mL via PERINEURAL

## 2017-03-02 MED ORDER — OCUVITE-LUTEIN PO CAPS
1.0000 | ORAL_CAPSULE | Freq: Every evening | ORAL | Status: DC
  Administered 2017-03-02 – 2017-03-04 (×3): 1 via ORAL
  Filled 2017-03-02 (×3): qty 1

## 2017-03-02 MED ORDER — OXYCODONE HCL 5 MG PO TABS
5.0000 mg | ORAL_TABLET | ORAL | Status: DC | PRN
  Administered 2017-03-02 (×2): 5 mg via ORAL
  Administered 2017-03-04 (×2): 10 mg via ORAL
  Filled 2017-03-02 (×2): qty 2
  Filled 2017-03-02 (×2): qty 1

## 2017-03-02 MED ORDER — BISACODYL 10 MG RE SUPP: 10.0000 mg | Freq: Every day | RECTAL | Status: DC | PRN

## 2017-03-02 MED ORDER — FENTANYL CITRATE (PF) 100 MCG/2ML IJ SOLN
25.0000 ug | INTRAMUSCULAR | Status: DC | PRN
  Administered 2017-03-02 (×4): 25 ug via INTRAVENOUS

## 2017-03-02 MED ORDER — PROPOFOL 10 MG/ML IV BOLUS
INTRAVENOUS | Status: DC | PRN
  Administered 2017-03-02: 30 mg via INTRAVENOUS

## 2017-03-02 MED ORDER — FUROSEMIDE 20 MG PO TABS
40.0000 mg | ORAL_TABLET | Freq: Every evening | ORAL | Status: DC
  Administered 2017-03-02: 40 mg via ORAL
  Filled 2017-03-02: qty 2

## 2017-03-02 MED ORDER — MENTHOL 3 MG MT LOZG
1.0000 | LOZENGE | OROMUCOSAL | Status: DC | PRN
  Filled 2017-03-02: qty 9

## 2017-03-02 MED ORDER — ONDANSETRON HCL 4 MG PO TABS: 4.0000 mg | ORAL_TABLET | Freq: Four times a day (QID) | ORAL | Status: DC | PRN

## 2017-03-02 MED ORDER — ONDANSETRON HCL 4 MG/2ML IJ SOLN: 4.0000 mg | Freq: Once | INTRAMUSCULAR | Status: DC | PRN

## 2017-03-02 MED ORDER — INSULIN ASPART PROT & ASPART (70-30 MIX) 100 UNIT/ML ~~LOC~~ SUSP
25.0000 [IU] | Freq: Two times a day (BID) | SUBCUTANEOUS | Status: DC
  Filled 2017-03-02: qty 25

## 2017-03-02 MED ORDER — ATORVASTATIN CALCIUM 20 MG PO TABS
20.0000 mg | ORAL_TABLET | Freq: Every evening | ORAL | Status: DC
  Administered 2017-03-02 – 2017-03-04 (×3): 20 mg via ORAL
  Filled 2017-03-02 (×3): qty 1

## 2017-03-02 MED ORDER — FENTANYL CITRATE (PF) 100 MCG/2ML IJ SOLN
INTRAMUSCULAR | Status: DC | PRN
  Administered 2017-03-02: 25 ug via INTRAVENOUS

## 2017-03-02 MED ORDER — DEXTROSE 5 % IV SOLN
2.0000 g | Freq: Four times a day (QID) | INTRAVENOUS | Status: AC
  Administered 2017-03-02 – 2017-03-03 (×3): 2 g via INTRAVENOUS
  Filled 2017-03-02 (×3): qty 2000

## 2017-03-02 MED ORDER — LACTATED RINGERS IV SOLN
INTRAVENOUS | Status: DC | PRN
  Administered 2017-03-02: 16:00:00 via INTRAVENOUS

## 2017-03-02 MED ORDER — ACETAMINOPHEN 325 MG PO TABS
650.0000 mg | ORAL_TABLET | Freq: Four times a day (QID) | ORAL | Status: DC | PRN
  Administered 2017-03-03: 650 mg via ORAL
  Filled 2017-03-02: qty 2

## 2017-03-02 MED ORDER — FAMOTIDINE 20 MG PO TABS
20.0000 mg | ORAL_TABLET | Freq: Once | ORAL | Status: AC
  Administered 2017-03-02: 20 mg via ORAL

## 2017-03-02 MED ORDER — ENOXAPARIN SODIUM 40 MG/0.4ML ~~LOC~~ SOLN
40.0000 mg | SUBCUTANEOUS | Status: DC
  Administered 2017-03-03 – 2017-03-05 (×3): 40 mg via SUBCUTANEOUS
  Filled 2017-03-02 (×3): qty 0.4

## 2017-03-02 MED ORDER — ACETAMINOPHEN 650 MG RE SUPP: 650.0000 mg | Freq: Four times a day (QID) | RECTAL | Status: DC | PRN

## 2017-03-02 MED ORDER — SODIUM CHLORIDE 0.9 % IV SOLN
INTRAVENOUS | Status: DC
  Administered 2017-03-02: 14:00:00 via INTRAVENOUS

## 2017-03-02 MED ORDER — BUPIVACAINE HCL (PF) 0.5 % IJ SOLN
INTRAMUSCULAR | Status: DC | PRN
  Administered 2017-03-02: 3 mL

## 2017-03-02 SURGICAL SUPPLY — 49 items
BLADE SAW SAG 18.5X105 (BLADE) ×3 IMPLANT
BNDG COHESIVE 6X5 TAN STRL LF (GAUZE/BANDAGES/DRESSINGS) ×9 IMPLANT
CANISTER SUCT 1200ML W/VALVE (MISCELLANEOUS) ×3 IMPLANT
CAPT HIP TOTAL 3 ×3 IMPLANT
CATH FOL LEG HOLDER (MISCELLANEOUS) IMPLANT
CATH TRAY METER 16FR LF (MISCELLANEOUS) IMPLANT
CHLORAPREP W/TINT 26ML (MISCELLANEOUS) ×3 IMPLANT
DRAPE C-ARM XRAY 36X54 (DRAPES) ×3 IMPLANT
DRAPE INCISE IOBAN 66X60 STRL (DRAPES) IMPLANT
DRAPE POUCH INSTRU U-SHP 10X18 (DRAPES) ×3 IMPLANT
DRAPE SHEET LG 3/4 BI-LAMINATE (DRAPES) ×9 IMPLANT
DRAPE TABLE BACK 80X90 (DRAPES) ×3 IMPLANT
DRESSING SURGICEL FIBRLLR 1X2 (HEMOSTASIS) ×2 IMPLANT
DRSG OPSITE POSTOP 4X8 (GAUZE/BANDAGES/DRESSINGS) IMPLANT
DRSG SURGICEL FIBRILLAR 1X2 (HEMOSTASIS) ×6
ELECT BLADE 6.5 EXT (BLADE) ×3 IMPLANT
ELECT REM PT RETURN 9FT ADLT (ELECTROSURGICAL) ×3
ELECTRODE REM PT RTRN 9FT ADLT (ELECTROSURGICAL) ×1 IMPLANT
GLOVE BIOGEL PI IND STRL 9 (GLOVE) ×1 IMPLANT
GLOVE BIOGEL PI INDICATOR 9 (GLOVE) ×2
GLOVE SURG SYN 9.0  PF PI (GLOVE) ×4
GLOVE SURG SYN 9.0 PF PI (GLOVE) ×2 IMPLANT
GOWN SRG 2XL LVL 4 RGLN SLV (GOWNS) ×1 IMPLANT
GOWN STRL NON-REIN 2XL LVL4 (GOWNS) ×2
GOWN STRL REUS W/ TWL LRG LVL3 (GOWN DISPOSABLE) ×2 IMPLANT
GOWN STRL REUS W/TWL LRG LVL3 (GOWN DISPOSABLE) ×4
HEMOVAC 400CC 10FR (MISCELLANEOUS) IMPLANT
HOOD PEEL AWAY FLYTE STAYCOOL (MISCELLANEOUS) ×3 IMPLANT
KIT PREVENA INCISION MGT 13 (CANNISTER) ×3 IMPLANT
MAT BLUE FLOOR 46X72 FLO (MISCELLANEOUS) ×3 IMPLANT
NDL SAFETY 18GX1.5 (NEEDLE) ×3 IMPLANT
NEEDLE SPNL 18GX3.5 QUINCKE PK (NEEDLE) ×3 IMPLANT
NS IRRIG 1000ML POUR BTL (IV SOLUTION) ×3 IMPLANT
PACK HIP COMPR (MISCELLANEOUS) ×3 IMPLANT
SOL PREP PVP 2OZ (MISCELLANEOUS) ×3
SOLUTION PREP PVP 2OZ (MISCELLANEOUS) ×1 IMPLANT
SPONGE DRAIN TRACH 4X4 STRL 2S (GAUZE/BANDAGES/DRESSINGS) IMPLANT
STAPLER SKIN PROX 35W (STAPLE) ×3 IMPLANT
STRAP SAFETY BODY (MISCELLANEOUS) ×3 IMPLANT
SUT DVC 2 QUILL PDO  T11 36X36 (SUTURE) ×2
SUT DVC 2 QUILL PDO T11 36X36 (SUTURE) ×1 IMPLANT
SUT SILK 0 (SUTURE) ×2
SUT SILK 0 30XBRD TIE 6 (SUTURE) ×1 IMPLANT
SUT V-LOC 90 ABS DVC 3-0 CL (SUTURE) ×3 IMPLANT
SUT VIC AB 1 CT1 36 (SUTURE) ×3 IMPLANT
SYR 20CC LL (SYRINGE) ×3 IMPLANT
SYR 30ML LL (SYRINGE) ×3 IMPLANT
TAPE MICROFOAM 4IN (TAPE) ×3 IMPLANT
TOWEL OR 17X26 4PK STRL BLUE (TOWEL DISPOSABLE) ×3 IMPLANT

## 2017-03-02 NOTE — Anesthesia Procedure Notes (Signed)
Spinal  Patient location during procedure: OR Start time: 03/02/2017 3:36 PM End time: 03/02/2017 3:41 PM Staffing Anesthesiologist: Katy Fitch K Performed: anesthesiologist  Preanesthetic Checklist Completed: patient identified, site marked, surgical consent, pre-op evaluation, timeout performed, IV checked, risks and benefits discussed and monitors and equipment checked Spinal Block Patient position: sitting Prep: ChloraPrep Patient monitoring: heart rate, continuous pulse ox, blood pressure and cardiac monitor Approach: midline Location: L4-5 Injection technique: single-shot Needle Needle type: Whitacre and Introducer  Needle gauge: 24 G Needle length: 9 cm Assessment Sensory level: T10 Additional Notes Negative paresthesia. Negative blood return. Positive free-flowing CSF. Expiration date of kit checked and confirmed. Patient tolerated procedure well, without complications.

## 2017-03-02 NOTE — OR Nursing (Signed)
Lab tech in for abo/rh draw @ 1347

## 2017-03-02 NOTE — Anesthesia Preprocedure Evaluation (Addendum)
Anesthesia Evaluation  Patient identified by MRN, date of birth, ID band Patient awake    Reviewed: Allergy & Precautions, NPO status , Patient's Chart, lab work & pertinent test results, reviewed documented beta blocker date and time   Airway Mallampati: III  TM Distance: <3 FB     Dental  (+) Upper Dentures, Caps   Pulmonary shortness of breath and with exertion, former smoker,    Pulmonary exam normal        Cardiovascular hypertension, Pt. on medications and Pt. on home beta blockers Normal cardiovascular exam     Neuro/Psych PSYCHIATRIC DISORDERS Depression CVA    GI/Hepatic GERD  Medicated,  Endo/Other  diabetes, Well Controlled, Type 2, Insulin Dependent  Renal/GU Renal InsufficiencyRenal disease  negative genitourinary   Musculoskeletal   Abdominal Normal abdominal exam  (+)   Peds negative pediatric ROS (+)  Hematology negative hematology ROS (+)   Anesthesia Other Findings   Reproductive/Obstetrics                            Anesthesia Physical Anesthesia Plan  ASA: III  Anesthesia Plan: Spinal   Post-op Pain Management:    Induction: Intravenous  Airway Management Planned: Nasal Cannula  Additional Equipment:   Intra-op Plan:   Post-operative Plan:   Informed Consent: I have reviewed the patients History and Physical, chart, labs and discussed the procedure including the risks, benefits and alternatives for the proposed anesthesia with the patient or authorized representative who has indicated his/her understanding and acceptance.   Dental advisory given  Plan Discussed with: CRNA and Surgeon  Anesthesia Plan Comments:         Anesthesia Quick Evaluation

## 2017-03-02 NOTE — Anesthesia Post-op Follow-up Note (Cosign Needed)
Anesthesia QCDR form completed.        

## 2017-03-02 NOTE — Op Note (Signed)
03/02/2017  5:21 PM  PATIENT:  Gary Thomas  83 y.o. male  PRE-OPERATIVE DIAGNOSIS:  PRIMARY OSTEOARTHRITIS OF RIGHT HIP  POST-OPERATIVE DIAGNOSIS:  PRIMARY OSTEOARTHRITIS OF RIGHT HIP  PROCEDURE:  Procedure(s): TOTAL HIP ARTHROPLASTY ANTERIOR APPROACH (Right)  SURGEON: Leitha SchullerMichael J Armen Waring, MD  ASSISTANTS: None  ANESTHESIA:   spinal  EBL:  Total I/O In: 1200 [I.V.:1200] Out: 450 [Urine:150; Blood:300]  BLOOD ADMINISTERED:none  DRAINS: none   LOCAL MEDICATIONS USED:  MARCAINE     SPECIMEN:  Source of Specimen:  Right femoral head  DISPOSITION OF SPECIMEN:  PATHOLOGY  COUNTS:  YES  TOURNIQUET:  * No tourniquets in log *  IMPLANTS: Medacta AMIS 4 lateralized stem with L head and 56 mm Mpact DM cup and liner  DICTATION: .Dragon Dictation   The patient was brought to the operating room and after spinal anesthesia was obtained patient was placed on the operative table with the ipsilateral foot into the Medacta attachment, contralateral leg on a well-padded table. C-arm was brought in and preop template x-ray taken. After prepping and draping in usual sterile fashion appropriate patient identification and timeout procedures were completed. Anterior approach to the hip was obtained and centered over the greater trochanter and TFL muscle. The subcutaneous tissue was incised hemostasis being achieved by electrocautery. TFL fascia was incised and the muscle retracted laterally deep retractor placed. The lateral femoral circumflex vessels were identified and ligated. The anterior capsule was exposed and a capsulotomy performed. The neck was identified and a femoral neck cut carried out with a saw. The head was removed without difficulty and showed sclerotic femoral head and acetabulum. Reaming was carried out to 56 mm and a 56 mm cup trial gave appropriate tightness to the acetabular component a 56 DM cup was impacted into position. The leg was then externally rotated and ischiofemoral and  pubofemoral releases carried out. The femur was sequentially broached to a size 4, size 4 lateralized with L head trials were placed and the final components chosen. The 4 lateralized stem was inserted along with a L 28 mm head and 56 mm liner. The hip was reduced and was stable the wound was thoroughly irrigated. The deep fascia was closed using a heavy Quill after infiltration of 30 cc of quarter percent Sensorcaine with epinephrine. 3-0 v-locl to close the skin with skin staples with an incisional wound VAC then applied. Fibrillar was placed deep in the wound along the posterior capsulotomy along the medial neck obtained postop hemostasis  PLAN OF CARE: Admit to inpatient

## 2017-03-02 NOTE — Transfer of Care (Signed)
Immediate Anesthesia Transfer of Care Note  Patient: Gary Thomas  Procedure(s) Performed: Procedure(s): TOTAL HIP ARTHROPLASTY ANTERIOR APPROACH (Right)  Patient Location: PACU  Anesthesia Type:Spinal  Level of Consciousness: sedated  Airway & Oxygen Therapy: Patient Spontanous Breathing and Patient connected to face mask oxygen  Post-op Assessment: Report given to RN and Post -op Vital signs reviewed and stable  Post vital signs: Reviewed and stable  Last Vitals:  Vitals:   03/02/17 1711 03/02/17 1713  BP: (!) 57/41 95/67  Pulse: 77 70  Resp: 18 (!) 25  Temp: 36.4 C     Last Pain:  Vitals:   03/02/17 1711  TempSrc:   PainSc: (P) Asleep         Complications: No apparent anesthesia complications

## 2017-03-02 NOTE — H&P (Signed)
Reviewed paper H+P, will be scanned into chart. No changes noted. Patient examined  

## 2017-03-03 ENCOUNTER — Encounter: Payer: Self-pay | Admitting: Orthopedic Surgery

## 2017-03-03 LAB — GLUCOSE, CAPILLARY
Glucose-Capillary: 160 mg/dL — ABNORMAL HIGH (ref 65–99)
Glucose-Capillary: 171 mg/dL — ABNORMAL HIGH (ref 65–99)
Glucose-Capillary: 137 mg/dL — ABNORMAL HIGH (ref 65–99)
Glucose-Capillary: 110 mg/dL — ABNORMAL HIGH (ref 65–99)

## 2017-03-03 LAB — CBC
HCT: 35.2 % — ABNORMAL LOW (ref 40.0–52.0)
Hemoglobin: 11.6 g/dL — ABNORMAL LOW (ref 13.0–18.0)
MCH: 28 pg (ref 26.0–34.0)
MCHC: 32.9 g/dL (ref 32.0–36.0)
MCV: 85.1 fL (ref 80.0–100.0)
Platelets: 160 10*3/uL (ref 150–440)
RBC: 4.14 MIL/uL — AB (ref 4.40–5.90)
RDW: 15 % — ABNORMAL HIGH (ref 11.5–14.5)
WBC: 15.5 10*3/uL — ABNORMAL HIGH (ref 3.8–10.6)

## 2017-03-03 LAB — BASIC METABOLIC PANEL
ANION GAP: 5 (ref 5–15)
BUN: 35 mg/dL — ABNORMAL HIGH (ref 6–20)
CHLORIDE: 106 mmol/L (ref 101–111)
CO2: 28 mmol/L (ref 22–32)
Calcium: 8.7 mg/dL — ABNORMAL LOW (ref 8.9–10.3)
Creatinine, Ser: 1.94 mg/dL — ABNORMAL HIGH (ref 0.61–1.24)
GFR calc non Af Amer: 30 mL/min — ABNORMAL LOW (ref >60)
GFR, EST AFRICAN AMERICAN: 35 mL/min — AB (ref >60)
Glucose, Bld: 164 mg/dL — ABNORMAL HIGH (ref 65–99)
Potassium: 5.2 mmol/L — ABNORMAL HIGH (ref 3.5–5.1)
Sodium: 139 mmol/L (ref 135–145)

## 2017-03-03 MED ORDER — SODIUM CHLORIDE 0.9 % IV BOLUS (SEPSIS)
500.0000 mL | Freq: Once | INTRAVENOUS | Status: AC
  Administered 2017-03-03: 500 mL via INTRAVENOUS

## 2017-03-03 MED ORDER — HYDROCODONE-ACETAMINOPHEN 5-325 MG PO TABS
1.0000 | ORAL_TABLET | ORAL | Status: DC | PRN
  Administered 2017-03-03 – 2017-03-05 (×3): 1 via ORAL
  Filled 2017-03-03 (×4): qty 1

## 2017-03-03 NOTE — Clinical Social Work Placement (Signed)
   CLINICAL SOCIAL WORK PLACEMENT  NOTE  Date:  03/03/2017  Patient Details  Name: Gary Thomas MRN: [id number] Date of Birth: [birth year]  Clinical Social Work is seeking post-discharge placement for this patient at the Skilled  Nursing Facility level of care (*CSW will initial, date and re-position this form in  chart as items are completed):  Yes   Patient/family provided with [facility] Clinical Social Work Department's list of facilities offering this level of care within the geographic area requested by the patient (or if unable, by the patient's family).  Yes   Patient/family informed of their freedom to choose among providers that offer the needed level of care, that participate in Medicare, Medicaid or managed care program needed by the patient, have an available bed and are willing to accept the patient.  Yes   Patient/family informed of [facility]'s ownership interest in [facility] and [facility], as well as of the fact that they are under no obligation to receive care at these facilities.  PASRR submitted to EDS on       PASRR number received on       Existing PASRR number confirmed on 03/03/17     FL2 transmitted to all facilities in geographic area requested by pt/family on 03/03/17     FL2 transmitted to all facilities within larger geographic area on       Patient informed that his/her managed care company has contracts with or will negotiate with certain facilities, including the following:        Yes   Patient/family informed of bed offers received.  Patient chooses bed at  [facility]     Physician recommends and patient chooses bed at      Patient to be transferred to   on  .  Patient to be transferred to facility by       Patient family notified on   of transfer.  Name of family member notified:        PHYSICIAN       Additional Comment:    _______________________________________________ Micala Saltsman, Darleen CrockerBailey M, LCSW 03/03/2017,  11:29 AM

## 2017-03-03 NOTE — Clinical Social Work Note (Addendum)
Clinical Social Work Assessment  Patient Details  Name: Gary Thomas MRN: [id number] Date of Birth: [birth year]  Date of referral:  03/03/17               Reason for consult:  Facility Placement                Permission sought to share information with:  Chartered certified accountant granted to share information::  Yes, Verbal Permission Granted  Name::      Gary Thomas::   Gary Thomas   Relationship::     Contact Information:     Housing/Transportation Living arrangements for the past 2 months:  [place] of Information:  Patient, Spouse Patient Interpreter Needed:  None Criminal Activity/Legal Involvement Pertinent to Current Situation/Hospitalization:  No - Comment as needed Significant Relationships:  Adult Children, Spouse Lives with:  Spouse Do you feel safe going back to the place where you live?  Yes Need for family participation in patient care:  Yes (Comment)  Care giving concerns:  Patient lives in [place] with his wife Gary Thomas.    Social Worker assessment / plan:  Holiday representative (CSW) received SNF consult. PT is recommending SNF. CSW met with patient and his wife Gary Thomas was at bedside. Patient was alert and oriented X4 and was sitting up in the chair. CSW introduced self and explained role of CSW department. Patient reported that he lives in [place] with his wife and they have 3 adult children. CSW explained that PT is recommending SNF and that medicare requires a 3 night qualifying inpatient stay at a hospital in order to pay for SNF. Patient was admitted to inpatient on 03/02/17. Patient and wife are agreeable to SNF search in [place]. FL2 complete and faxed out.  CSW presented bed offers and patient and wife chose [facility] Patient is agreeable to a semi-private room if that is all [facility] has available. Plan is for patient to D/C to [facility] on Friday 03/05/17. CSW left  [facility] admissions coordinator at [employer] making her aware of above. CSW will continue to follow and assist as needed.   [facility] admissions coordinator at [facility] called [facility] back and reported that patient can come on Friday to a semi-private room and asked patient to bring his ostomy supplies from home to the facility. Wife stated that she would bring the ostomy supplies to [facility] and they are fine with a semi-private room.   Employment status:  Retired Forensic scientist:  Medicare PT Recommendations:  [place] / Referral to community resources:  [place]  Patient/Family's Response to care: Patient is agreeable to D/C to [facility] for short term rehab.   Patient/Family's Understanding of and Emotional Response to Diagnosis, Current Treatment, and Prognosis:  Patient and his wife were very pleasant and thanked CSW for visit.   Emotional Assessment Appearance:  Appears stated age Attitude/Demeanor/Rapport:    Affect (typically observed):  Accepting, Adaptable, Pleasant Orientation:  Oriented to Self, Oriented to Place, Oriented to  Time, Oriented to Situation Alcohol / Substance use:  Not Applicable Psych involvement (Current and /or in the community):  No (Comment)  Discharge Needs  Concerns to be addressed:  Discharge Planning Concerns Readmission within the last 30 days:  No Current discharge risk:  Dependent with Mobility Barriers to Discharge:  Continued Medical Work up   [employer], Veronia Beets, LCSW 03/03/2017, 11:30 AM

## 2017-03-03 NOTE — Progress Notes (Signed)
Patient's BP 102/31, BP rechecked manually 88/38. MD. Rosita KeaMenz notified. Order received for 500 ml NS bolus.   Stephannie PetersSusana G Patino Hernandez, RN

## 2017-03-03 NOTE — Evaluation (Signed)
Physical Therapy Evaluation Patient Details Name: Gary Thomas MRN: [id number] DOB: 1931/11/30 Today's Date: 03/03/2017   History of Present Illness  Pt is an 83 y.o. male s/p R THA anterior approach 03/02/17 secondary to OA.  PMH includes SOB, DM, htn, depression, PE, renal insufficiency (has 1 kidney), h/o stroke with R sided weakness.  Clinical Impression  Prior to hospital admission, pt was ambulating modified independently with RW.  Pt lives with his wife (who has had a recent back surgery) in 1 level home with steps to enter.  Currently pt is min assist supine to sit and requiring 2 assist to stand and transfer to chair safely with use of RW.  Pain 6-7/10 R hip during session.  Pt would benefit from skilled PT to address noted impairments and functional limitations (see below for any additional details).  Upon hospital discharge, recommend pt discharge to STR.    Follow Up Recommendations SNF    Equipment Recommendations  Rolling walker with 5" wheels;3in1 (PT) (pt has RW and BSC at home already)    Recommendations for Other Services OT consult     Precautions / Restrictions Precautions Precautions: Fall;Anterior Hip Precaution Booklet Issued: Yes (comment) Precaution Comments: Urostomy; wound vac Restrictions Weight Bearing Restrictions: Yes RLE Weight Bearing: Weight bearing as tolerated      Mobility  Bed Mobility Overal bed mobility: Needs Assistance Bed Mobility: Supine to Sit     Supine to sit: Min assist;HOB elevated     General bed mobility comments: assist for R LE; heavy UE use of bedrails and increased effort and time to perform; vc's for technique  Transfers Overall transfer level: Needs assistance Equipment used: Rolling walker (2 wheeled) Transfers: Sit to/from Stand Sit to Stand: Min assist;Mod assist;+2 physical assistance         General transfer comment: 1st attempt to stand unable to stand with 1 assist; 2nd attempt at standing pt stood  with max assist x1 but upon coming to upright pt's LE's buckled and required max assist to lower back down safely to bed; 3rd attempt standing pt required min to mod assist x2 to stand and maintain upright initially  Ambulation/Gait Ambulation/Gait assistance: Min assist;Mod assist;+2 physical assistance Ambulation Distance (Feet): 3 Feet (bed to recliner) Assistive device: Rolling walker (2 wheeled)   Gait velocity: decreased   General Gait Details: antalgic; decreased stance time R LE; vc's for technique and increasing UE support through [employer]    Modified Rankin (Stroke Patients Only)       Balance Overall balance assessment: Needs assistance Sitting-balance support: Bilateral upper extremity supported;Feet supported Sitting balance-Leahy Scale: Fair Sitting balance - Comments: static sitting   Standing balance support: Bilateral upper extremity supported (on RW) Standing balance-Leahy Scale: Fair Standing balance comment: static standing                             Pertinent Vitals/Pain Pain Assessment: 0-10 Pain Score: 6  Pain Location: R hip Pain Descriptors / Indicators: Throbbing;Sore;Tender Pain Intervention(s): Limited activity within patient's tolerance;Monitored during session;Premedicated before session;Repositioned  Vitals (HR and O2 on 2 L via nasal cannula) stable and WFL throughout treatment session.    Home Living Family/patient expects to be discharged to:: Private residence Living Arrangements: Spouse/significant other Available Help at Discharge: Family Type of Home: House Home Access: Stairs to enter   [employer] of  Steps: 2-3 steps to enter with B rail (but uses L rail d/t how door opens) Home Layout: One level Home Equipment: Walker - 2 wheels;Bedside commode;Grab bars - tub/shower;Shower seat - built in      Prior Function Level of Independence: Needs assistance   Gait /  Transfers Assistance Needed: Pt ambulates with RW.      Comments: CPAP use at night (no O2 during the day).  Pt denies any falls in past 6 months.     Hand Dominance        Extremity/Trunk Assessment   Upper Extremity Assessment Upper Extremity Assessment: Generalized weakness    Lower Extremity Assessment Lower Extremity Assessment: RLE deficits/detail;LLE deficits/detail RLE Deficits / Details: hip flexion at least 2/5 (limited d/t R hip pain); knee flexion/extension at least 2+/5 (limited d/t R hip pain); DF at least 3+/5 RLE: Unable to fully assess due to pain LLE Deficits / Details: generalized weakness       Communication   Communication: No difficulties  Cognition Arousal/Alertness: Awake/alert Behavior During Therapy: WFL for tasks assessed/performed Overall Cognitive Status: Within Functional Limits for tasks assessed                                        General Comments General comments (skin integrity, edema, etc.): Upon PT entry, pt with wound vac and urostomy in place.  Nursing cleared pt for participation in physical therapy.  Pt agreeable to PT session.    Exercises Total Joint Exercises Ankle Circles/Pumps: AROM;Strengthening;Both;10 reps;Supine Quad Sets: AROM;Strengthening;Both;10 reps;Supine Gluteal Sets: AROM;Strengthening;Both;10 reps;Supine Towel Squeeze: AROM;Strengthening;Both;10 reps;Supine (pillow between pt's knees to squeeze) Short Arc Quad: Strengthening;Both;10 reps;Supine;Right;AAROM Heel Slides: Strengthening;Both;10 reps;Supine;Right;AAROM Hip ABduction/ADduction: Strengthening;Both;10 reps;Supine;Right;AAROM  Pt requiring vc's for above exercise technique.   Assessment/Plan    PT Assessment Patient needs continued PT services  PT Problem List Decreased strength;Decreased activity tolerance;Decreased balance;Decreased mobility;Decreased knowledge of use of DME;Decreased knowledge of precautions;Pain       PT  Treatment Interventions DME instruction;Gait training;Stair training;Functional mobility training;Therapeutic activities;Therapeutic exercise;Balance training;Patient/family education    PT Goals (Current goals can be found in the Care Plan section)  Acute Rehab PT Goals Patient Stated Goal: to go home PT Goal Formulation: With patient Time For Goal Achievement: 03/17/17 Potential to Achieve Goals: Fair    Frequency BID   Barriers to discharge Decreased caregiver support      Co-evaluation               AM-PAC PT "6 Clicks" Daily Activity  Outcome Measure Difficulty turning over in bed (including adjusting bedclothes, sheets and blankets)?: Total Difficulty moving from lying on back to sitting on the side of the bed? : Total Difficulty sitting down on and standing up from a chair with arms (e.g., wheelchair, bedside commode, etc,.)?: Total Help needed moving to and from a bed to chair (including a wheelchair)?: Total Help needed walking in hospital room?: Total Help needed climbing 3-5 steps with a railing? : Total 6 Click Score: 6    End of Session Equipment Utilized During Treatment: Gait belt;Oxygen (2 L O2 via nasal cannula) Activity Tolerance: Patient limited by pain Patient left: in chair;with call bell/phone within reach;with chair alarm set;with SCD's reapplied (B heels elevated via towel rolls) Nurse Communication: Mobility status;Precautions;Weight bearing status PT Visit Diagnosis: Muscle weakness (generalized) (M62.81);Difficulty in walking, not elsewhere classified (R26.2);Pain Pain - Right/Left: Right Pain - part  of body: Hip    Time: 0935-1020 PT Time Calculation (min) (ACUTE ONLY): 45 min   Charges:   PT Evaluation $PT Eval Low Complexity: 1 Procedure PT Treatments $Therapeutic Exercise: 8-22 mins $Therapeutic Activity: 8-22 mins   PT G CodesHendricks Limes:        Tobenna Needs, PT 03/03/17, 12:02 PM [phone]

## 2017-03-03 NOTE — Progress Notes (Signed)
Pts. Potassium level is 5.2. Cranston Neighborhris Gaines notified and will recheck lab value in the AM.

## 2017-03-03 NOTE — Progress Notes (Signed)
Pt. Can't tolerate oxycodone. Cranston Neighborhris Gaines gave verbal order for hydrocodone 1-2 tabs q4h prn.

## 2017-03-03 NOTE — [place] FL2 (Signed)
[place] MEDICAID FL2 LEVEL OF CARE SCREENING TOOL     IDENTIFICATION  Patient Name: Gary Thomas Birthdate: [birth year] Sex: male Admission Date (Current Location): 03/02/2017  [place] and [place] Number:  Chiropodist and Address:  [facility], [address], [place], [place] [address]      Provider Number: [id number]  Attending Physician Name and Address:  Kennedy Bucker, MD  Relative Name and Phone Number:       Current Level of Care: Hospital Recommended Level of Care: Skilled Nursing Facility Prior Approval Number:    Date Approved/Denied:   PASRR Number:  [id number])  Discharge Plan: SNF    Current Diagnoses: Patient Active Problem List   Diagnosis Date Noted  . Primary localized osteoarthritis of right hip 03/02/2017    Orientation RESPIRATION BLADDER Height & Weight     Self, Time, Situation, Place  O2 (2 Liters Oxygen ) Continent Weight: 245 lb 4.8 oz (111.3 kg) Height:  5\' 9"  (175.3 cm)  BEHAVIORAL SYMPTOMS/MOOD NEUROLOGICAL BOWEL NUTRITION STATUS   (none)  (none) Continent Diet (Diet: Carb Modified )  AMBULATORY STATUS COMMUNICATION OF NEEDS Skin   Extensive Assist Verbally Surgical wounds, Wound Vac (Incision: Right Hip. Provena Wound Vac. )                       Personal Care Assistance Level of Assistance  Bathing, Feeding, Dressing Bathing Assistance: Limited assistance Feeding assistance: Independent Dressing Assistance: Limited assistance     Functional Limitations Info  Sight, Hearing, Speech Sight Info: Adequate Hearing Info: Adequate Speech Info: Adequate    SPECIAL CARE FACTORS FREQUENCY  PT (By licensed PT), OT (By licensed OT)     PT Frequency:  (5) OT Frequency:  (5)            Contractures      Additional Factors Info  Code Status, Allergies, Insulin Sliding Scale Code Status Info:  (Full Code. ) Allergies Info:  (Codeine, Morphine, Gabapentin, Stadol Butorphanol,  Tapentadol, Lyrica Pregabalin)   Insulin Sliding Scale Info:  (NovoLog Insulin Injections. )       Current Medications (03/03/2017):  This is the current hospital active medication list Current Facility-Administered Medications  Medication Dose Route Frequency Provider Last Rate Last Dose  . 0.9 %  sodium chloride infusion   Intravenous Continuous Kennedy Bucker, MD 75 mL/hr at 03/02/17 1956    . acetaminophen (TYLENOL) tablet 650 mg  650 mg Oral Q6H PRN Kennedy Bucker, MD       Or  . acetaminophen (TYLENOL) suppository 650 mg  650 mg Rectal Q6H PRN Kennedy Bucker, MD      . aluminum hydroxide (AMPHOJEL/ALTERNAGEL) suspension 15-30 mL  15-30 mL Oral Q4H PRN Kennedy Bucker, MD      . atorvastatin (LIPITOR) tablet 20 mg  20 mg Oral QPM Kennedy Bucker, MD   20 mg at 03/02/17 2009  . bisacodyl (DULCOLAX) suppository 10 mg  10 mg Rectal Daily PRN Kennedy Bucker, MD      . ceFAZolin (ANCEF) 2 g in dextrose 5 % 100 mL IVPB  2 g Intravenous Q6H Kennedy Bucker, MD   Stopped at 03/03/17 0534  . diphenhydrAMINE (BENADRYL) 12.5 MG/5ML elixir 12.5-25 mg  12.5-25 mg Oral Q4H PRN Kennedy Bucker, MD      . docusate sodium (COLACE) capsule 100 mg  100 mg Oral BID Kennedy Bucker, MD   100 mg at 03/03/17 0831  . enoxaparin (LOVENOX) injection 40 mg  40 mg Subcutaneous Q24H Kennedy BuckerMenz, Michael, MD   40 mg at 03/03/17 0830  . furosemide (LASIX) tablet 40 mg  40 mg Oral QPM Kennedy BuckerMenz, Michael, MD   40 mg at 03/02/17 2009  . HYDROcodone-acetaminophen (NORCO/VICODIN) 5-325 MG per tablet 1-2 tablet  1-2 tablet Oral Q4H PRN Evon SlackGaines, Thomas C, PA-C   1 tablet at 03/03/17 0851  . HYDROmorphone (DILAUDID) injection 1 mg  1 mg Intravenous Q2H PRN [facility] BuckerMenz, Michael, MD   1 mg at 03/03/17 0504  . insulin aspart (novoLOG) injection 0-15 Units  0-15 Units Subcutaneous TID WC [facility] BuckerMenz, Michael, MD   3 Units at 03/03/17 0825  . insulin aspart protamine- aspart (NOVOLOG MIX 70/30) injection 25 Units  25 Units Subcutaneous BID Kennedy BuckerMenz, Michael, MD      .  losartan (COZAAR) tablet 25 mg  25 mg Oral QPM Kennedy BuckerMenz, Michael, MD   25 mg at 03/02/17 2009  . magnesium citrate solution 1 Bottle  1 Bottle Oral Once PRN Kennedy BuckerMenz, Michael, MD      . magnesium hydroxide (MILK OF MAGNESIA) suspension 30 mL  30 mL Oral Daily PRN [facility] BuckerMenz, Michael, MD      . menthol-cetylpyridinium (CEPACOL) lozenge 3 mg  1 lozenge Oral PRN [facility] BuckerMenz, Michael, MD       Or  . phenol (CHLORASEPTIC) mouth spray 1 spray  1 spray Mouth/Throat PRN Kennedy BuckerMenz, Michael, MD      . methocarbamol (ROBAXIN) tablet 500 mg  500 mg Oral Q6H PRN [facility] BuckerMenz, Michael, MD       Or  . methocarbamol (ROBAXIN) 500 mg in dextrose 5 % 50 mL IVPB  500 mg Intravenous Q6H PRN [facility] BuckerMenz, Michael, MD      . metoCLOPramide (REGLAN) tablet 5-10 mg  5-10 mg Oral Q8H PRN [facility] BuckerMenz, Michael, MD       Or  . metoCLOPramide (REGLAN) injection 5-10 mg  5-10 mg Intravenous Q8H PRN [facility] BuckerMenz, Michael, MD      . metoprolol succinate (TOPROL-XL) 24 hr tablet 100 mg  100 mg Oral QPM [facility] BuckerMenz, Michael, MD   100 mg at 03/02/17 2009  . multivitamin-lutein (OCUVITE-LUTEIN) capsule 1 capsule  1 capsule Oral QPM [facility] BuckerMenz, Michael, MD   1 capsule at 03/02/17 2008  . ondansetron (ZOFRAN) tablet 4 mg  4 mg Oral Q6H PRN [facility] BuckerMenz, Michael, MD       Or  . ondansetron [facility](ZOFRAN) injection 4 mg  4 mg Intravenous Q6H PRN [facility] BuckerMenz, Michael, MD   4 mg at 03/03/17 0344  . oxyCODONE (Oxy IR/ROXICODONE) immediate release tablet 5-10 mg  5-10 mg Oral Q3H PRN [facility] BuckerMenz, Michael, MD   5 mg at 03/02/17 2102  . sertraline (ZOLOFT) tablet 100 mg  100 mg Oral QPM [facility] BuckerMenz, Michael, MD   100 mg at 03/02/17 2009  . zolpidem (AMBIEN) tablet 5 mg  5 mg Oral QHS PRN [facility] BuckerMenz, Michael, MD         Discharge Medications: Please see discharge summary for a list of discharge medications.  Relevant Imaging Results:  Relevant Lab Results:   Additional Information  (SSN: [id number])  Dessie Delcarlo, Darleen CrockerBailey M, LCSW

## 2017-03-03 NOTE — Progress Notes (Addendum)
   Subjective: 1 Day Post-Op Procedure(s) (LRB): TOTAL HIP ARTHROPLASTY ANTERIOR APPROACH (Right) Patient reports pain as 6 on 0-10 scale.   Patient is well, and has had no acute complaints or problems Denies any CP, SOB, ABD pain. We will start therapy today.  Plan is to go Home after hospital stay.  Objective: Vital signs in last 24 hours: Temp:  [97.5 F (36.4 C)-98.6 F (37 C)] 98.6 F (37 C) (05/22 2210) Pulse Rate:  [53-78] 78 (05/22 2210) Resp:  [11-26] 19 (05/22 2210) BP: (57-171)/(41-93) 121/49 (05/22 2210) SpO2:  [90 %-100 %] 96 % (05/22 2210) Weight:  [108.9 kg (240 lb)-111.3 kg (245 lb 4.8 oz)] 111.3 kg (245 lb 4.8 oz) (05/22 1938)  Intake/Output from previous day: 05/22 0701 - 05/23 0700 In: 1953.8 [I.V.:1853.8; IV Piggyback:100] Out: 1075 [Urine:775; Blood:300] Intake/Output this shift: Total I/O In: 753.8 [I.V.:653.8; IV Piggyback:100] Out: 625 [Urine:625]   Recent Labs  03/02/17 2224 03/03/17 0331  HGB 11.8* 11.6*    Recent Labs  03/02/17 2224 03/03/17 0331  WBC 13.4* 15.5*  RBC 4.29* 4.14*  HCT 36.4* 35.2*  PLT 156 160    Recent Labs  03/02/17 2126 03/03/17 0331  NA  --  139  K  --  5.2*  CL  --  106  CO2  --  28  BUN  --  35*  CREATININE 1.66* 1.94*  GLUCOSE  --  164*  CALCIUM  --  8.7*   No results for input(s): LABPT, INR in the last 72 hours.  EXAM General - Patient is Alert, Appropriate and Oriented Extremity - Neurovascular intact Sensation intact distally Intact pulses distally Dorsiflexion/Plantar flexion intact No cellulitis present Compartment soft Dressing - dressing C/D/I and no drainage, wound vac intact, moderate drainage Motor Function - intact, moving foot and toes well on exam.   Past Medical History:  Diagnosis Date  . Depression   . Diabetes mellitus without complication (HCC)   . Dyspnea   . Elevated lipids   . GERD (gastroesophageal reflux disease)   . Hypertension   . Pulmonary emboli (HCC)   .  Renal insufficiency    only has 1 kidney  . Stroke (HCC)    Rt side weakness    Assessment/Plan:   1 Day Post-Op Procedure(s) (LRB): TOTAL HIP ARTHROPLASTY ANTERIOR APPROACH (Right) Active Problems:   Primary localized osteoarthritis of right hip  Estimated body mass index is 36.22 kg/m as calculated from the following:   Height as of this encounter: 5\' 9"  (1.753 m).   Weight as of this encounter: 111.3 kg (245 lb 4.8 oz). Advance diet Up with therapy  Hyperkalemia - K 5.2. Recheck K in the am Recheck labs in the am CM to assist with discharge  DVT Prophylaxis - Lovenox, Foot Pumps and TED hose Weight-Bearing as tolerated to right leg   T. Cran[place] Neighborhris Vaibhav Fogleman, PA-C [facility] 03/03/2017, 6:51 AM

## 2017-03-03 NOTE — Progress Notes (Signed)
Physical Therapy Treatment Patient Details Name: Gary Thomas R Bovee MRN: [id number] DOB: [birth year] Today's Date: 03/03/2017    History of Present Illness Pt is an 83 y.o. male s/p R THA anterior approach 03/02/17 secondary to OA.  PMH includes SOB, DM, htn, depression, PE, renal insufficiency (has 1 kidney), h/o stroke with R sided weakness.    PT Comments    Focused on standing with decreased assist levels during session.  Pt demonstrating decreased WB'ing to R LE during session and had difficulty advancing L LE while marching in place using RW d/t R hip pain.  Pt requiring rest breaks d/t SOB with activity.  Pt's O2 sats 88-91% with activity but 94% at end of session (all on 2 L O2 via nasal cannula).  Will continue to progress pt with LE strengthening, decreasing assist levels with transfers, and progress pt with ambulation next session per pt tolerance.    Follow Up Recommendations  SNF     Equipment Recommendations  Rolling walker with 5" wheels;3in1 (PT) (pt has RW and BSC at home already)    Recommendations for Other Services OT consult     Precautions / Restrictions Precautions Precautions: Fall;Anterior Hip Precaution Booklet Issued: Yes (comment) Precaution Comments: Urostomy; wound vac Restrictions Weight Bearing Restrictions: Yes RLE Weight Bearing: Weight bearing as tolerated    Mobility  Bed Mobility Overal bed mobility: Needs Assistance Bed Mobility: Supine to Sit;Sit to Supine     Supine to sit: Min assist;HOB elevated Sit to supine: Min assist;HOB elevated   General bed mobility comments: assist for R LE; heavy UE use of bedrails and increased effort and time to perform; vc's for technique  Transfers Overall transfer level: Needs assistance Equipment used: Rolling walker (2 wheeled) Transfers: Sit to/from Stand Sit to Stand: Mod assist;Max assist         General transfer comment: pt stood 5x's varying between mod to max assist x1 and also requiring  multiple attempts to stand each attempt; vc's to come to upright posture each time  Ambulation/Gait Ambulation/Gait assistance: Min assist;Mod assist Ambulation Distance (Feet):  (marching in place x5 reps B LE's) Assistive device: Rolling walker (2 wheeled)   Gait velocity: decreased   General Gait Details: antalgic; decreased stance time R LE; decreased L LE foot clearance; vc's for technique and increasing UE support through [employer]   Stairs            Wheelchair Mobility    Modified Rankin (Stroke Patients Only)       Balance Overall balance assessment: Needs assistance Sitting-balance support: Bilateral upper extremity supported;Feet supported Sitting balance-Leahy Scale: Fair Sitting balance - Comments: static sitting   Standing balance support: Bilateral upper extremity supported (on RW) Standing balance-Leahy Scale: Fair Standing balance comment: static standing                            Cognition Arousal/Alertness: Awake/alert Behavior During Therapy: WFL for tasks assessed/performed Overall Cognitive Status: Within Functional Limits for tasks assessed                                        Exercises     General Comments General comments (skin integrity, edema, etc.): Wound vac and urostomy in place.  Pt agreeable to PT session.      Pertinent Vitals/Pain Pain Assessment: 0-10 Pain Score: 6  Pain Location:  R hip Pain Descriptors / Indicators: Throbbing;Sore;Tender Pain Intervention(s): Limited activity within patient's tolerance;Monitored during session;Premedicated before session;Repositioned    Home Living Family/patient expects to be discharged to:: Private residence Living Arrangements: Spouse/significant other Available Help at Discharge: Family Type of Home: House Home Access: Stairs to enter   Home Layout: One level Home Equipment: Environmental consultant - 2 wheels;Bedside commode;Grab bars - tub/shower;Shower seat - built in       Prior Function Level of Independence: Needs assistance  Gait / Transfers Assistance Needed: Pt ambulates with RW.    Comments: CPAP use at night (no O2 during the day).  Pt denies any falls in past 6 months.   PT Goals (current goals can now be found in the care plan section) Acute Rehab PT Goals Patient Stated Goal: to go home PT Goal Formulation: With patient Time For Goal Achievement: 03/17/17 Potential to Achieve Goals: Fair Progress towards PT goals: Progressing toward goals    Frequency    BID      PT Plan Current plan remains appropriate    Co-evaluation              AM-PAC PT "6 Clicks" Daily Activity  Outcome Measure  Difficulty turning over in bed (including adjusting bedclothes, sheets and blankets)?: Total Difficulty moving from lying on back to sitting on the side of the bed? : Total Difficulty sitting down on and standing up from a chair with arms (e.g., wheelchair, bedside commode, etc,.)?: Total Help needed moving to and from a bed to chair (including a wheelchair)?: A Lot Help needed walking in hospital room?: Total Help needed climbing 3-5 steps with a railing? : Total 6 Click Score: 7    End of Session Equipment Utilized During Treatment: Gait belt;Oxygen (2 L O2 via nasal cannula) Activity Tolerance: Patient limited by pain;Patient limited by fatigue Patient left: in bed;with call bell/phone within reach;with bed alarm set;with SCD's reapplied (B heels elevated via towel rolls) Nurse Communication: Mobility status;Precautions;Weight bearing status PT Visit Diagnosis: Muscle weakness (generalized) (M62.81);Difficulty in walking, not elsewhere classified (R26.2);Pain Pain - Right/Left: Right Pain - part of body: Hip     Time: 1610-9604 PT Time Calculation (min) (ACUTE ONLY): 29 min  Charges:  $Therapeutic Exercise: 8-22 mins $Therapeutic Activity: 23-37 mins                    G CodesHendricks Limes, PT 03/03/17, 3:35  PM [phone]

## 2017-03-03 NOTE — Progress Notes (Signed)
Due to low BP and meds ordered notified Dr Rosita KeaMenz. New order to hold Cozar and lasix for tonight dose only. OK to give metoprolol dose.  Stephannie PetersSusana G Patino Hernandez, RN

## 2017-03-03 NOTE — Anesthesia Postprocedure Evaluation (Signed)
Anesthesia Post Note  Patient: Gary Thomas  Procedure(s) Performed: Procedure(s) (LRB): TOTAL HIP ARTHROPLASTY ANTERIOR APPROACH (Right)  Patient location during evaluation: Nursing Unit Anesthesia Type: Spinal Level of consciousness: awake and alert Pain management: pain level controlled Vital Signs Assessment: post-procedure vital signs reviewed and stable Respiratory status: spontaneous breathing and respiratory function stable Cardiovascular status: blood pressure returned to baseline and stable Postop Assessment: spinal receding and no headache Anesthetic complications: no     Last Vitals:  Vitals:   03/02/17 2110 03/02/17 2210  BP: (!) 156/51 (!) 121/49  Pulse: 72 78  Resp: 19 19  Temp: 36.7 C 37 C    Last Pain:  Vitals:   03/03/17 0530  TempSrc:   PainSc: Asleep                 Jules SchickLogan,  Monterrio Gerst P

## 2017-03-04 ENCOUNTER — Encounter
Admission: RE | Admit: 2017-03-04 | Discharge: 2017-03-04 | Disposition: A | Discharge status: Home / Self Care | Payer: Medicare Other | Source: Ambulatory Visit | Attending: Internal Medicine | Admitting: Internal Medicine

## 2017-03-04 LAB — BASIC METABOLIC PANEL
Anion gap: 4 — ABNORMAL LOW (ref 5–15)
BUN: 39 mg/dL — AB (ref 6–20)
CO2: 27 mmol/L (ref 22–32)
Calcium: 8.2 mg/dL — ABNORMAL LOW (ref 8.9–10.3)
Chloride: 106 mmol/L (ref 101–111)
Creatinine, Ser: 1.94 mg/dL — ABNORMAL HIGH (ref 0.61–1.24)
GFR calc Af Amer: 35 mL/min — ABNORMAL LOW (ref >60)
GFR calc non Af Amer: 30 mL/min — ABNORMAL LOW (ref >60)
Glucose, Bld: 140 mg/dL — ABNORMAL HIGH (ref 65–99)
POTASSIUM: 4.9 mmol/L (ref 3.5–5.1)
SODIUM: 137 mmol/L (ref 135–145)

## 2017-03-04 LAB — CBC
HCT: 27.7 % — ABNORMAL LOW (ref 40.0–52.0)
HEMOGLOBIN: 9.3 g/dL — AB (ref 13.0–18.0)
MCH: 28.5 pg (ref 26.0–34.0)
MCHC: 33.7 g/dL (ref 32.0–36.0)
MCV: 84.6 fL (ref 80.0–100.0)
Platelets: 101 10*3/uL — ABNORMAL LOW (ref 150–440)
RBC: 3.28 MIL/uL — AB (ref 4.40–5.90)
RDW: 14.8 % — ABNORMAL HIGH (ref 11.5–14.5)
WBC: 10.1 10*3/uL (ref 3.8–10.6)

## 2017-03-04 LAB — GLUCOSE, CAPILLARY
GLUCOSE-CAPILLARY: 131 mg/dL — AB (ref 65–99)
GLUCOSE-CAPILLARY: 149 mg/dL — AB (ref 65–99)
GLUCOSE-CAPILLARY: 121 mg/dL — AB (ref 65–99)
Glucose-Capillary: 141 mg/dL — ABNORMAL HIGH (ref 65–99)

## 2017-03-04 LAB — SURGICAL PATHOLOGY

## 2017-03-04 MED ORDER — FE FUMARATE-B12-VIT C-FA-IFC PO CAPS
1.0000 | ORAL_CAPSULE | Freq: Two times a day (BID) | ORAL | Status: DC
  Administered 2017-03-04 – 2017-03-05 (×3): 1 via ORAL
  Filled 2017-03-04 (×3): qty 1

## 2017-03-04 NOTE — Progress Notes (Signed)
   Subjective: 2 Days Post-Op Procedure(s) (LRB): TOTAL HIP ARTHROPLASTY ANTERIOR APPROACH (Right) Patient reports pain as mild.   Patient is well, and has had no acute complaints or problems. Hypotensive yesterday. BP improved today. Denies any CP, SOB, ABD pain. We will conitnue therapy today.  Plan is to go Skilled nursing facility after hospital stay.  Objective: Vital signs in last 24 hours: Temp:  [97.9 F (36.6 C)-98.4 F (36.9 C)] 98.3 F (36.8 C) (05/24 0731) Pulse Rate:  [69-80] 73 (05/24 0731) Resp:  [16-20] 16 (05/24 0731) BP: (88-139)/(31-49) 139/49 (05/24 0731) SpO2:  [91 %-95 %] 91 % (05/24 0731)  Intake/Output from previous day: 05/23 0701 - 05/24 0700 In: 2287.5 [P.O.:480; I.V.:1807.5] Out: 683 [Urine:683] Intake/Output this shift: No intake/output data recorded.   Recent Labs  03/02/17 2224 03/03/17 0331 03/04/17 0403  HGB 11.8* 11.6* 9.3*    Recent Labs  03/03/17 0331 03/04/17 0403  WBC 15.5* 10.1  RBC 4.14* 3.28*  HCT 35.2* 27.7*  PLT 160 101*    Recent Labs  03/03/17 0331 03/04/17 0403  NA 139 137  K 5.2* 4.9  CL 106 106  CO2 28 27  BUN 35* 39*  CREATININE 1.94* 1.94*  GLUCOSE 164* 140*  CALCIUM 8.7* 8.2*   No results for input(s): LABPT, INR in the last 72 hours.  EXAM General - Patient is Alert, Appropriate and Oriented Extremity - Neurovascular intact Sensation intact distally Intact pulses distally Dorsiflexion/Plantar flexion intact No cellulitis present Compartment soft Dressing - dressing C/D/I and no drainage, wound vac intact, 1/2 canister full, no change in last 24 hrs. Motor Function - intact, moving foot and toes well on exam.   Past Medical History:  Diagnosis Date  . Depression   . Diabetes mellitus without complication (HCC)   . Dyspnea   . Elevated lipids   . GERD (gastroesophageal reflux disease)   . Hypertension   . Pulmonary emboli (HCC)   . Renal insufficiency    only has 1 kidney  . Stroke  (HCC)    Rt side weakness    Assessment/Plan:   2 Days Post-Op Procedure(s) (LRB): TOTAL HIP ARTHROPLASTY ANTERIOR APPROACH (Right) Active Problems:   Primary localized osteoarthritis of right hip    Estimated body mass index is 36.22 kg/m as calculated from the following:   Height as of this encounter: 5\' 9"  (1.753 m).   Weight as of this encounter: 111.3 kg (245 lb 4.8 oz). Advance diet Up with therapy  Hyperkalemia - K 4.9, resolved Acute post op blood loss anemia - start Iron supplement CM to assist with discharge, SNF tomorrow  DVT Prophylaxis - Lovenox, Foot Pumps and TED hose Weight-Bearing as tolerated to right leg   T. Cranston Neighborhris Kaytlan Behrman, PA-C [facility] 03/04/2017, 7:43 AM

## 2017-03-04 NOTE — Progress Notes (Signed)
Physical Therapy Treatment Patient Details Name: Gary Thomas MRN: [id number] DOB: June 16, 1932 Today's Date: 03/04/2017    History of Present Illness Pt is an 83 y.o. male s/p R THA anterior approach 03/02/17 secondary to OA.  PMH includes SOB, DM, htn, depression, PE, renal insufficiency (has 1 kidney), h/o stroke with R sided weakness.    PT Comments    Pt ambulated 3 feet and then 5 feet with RW and 2 assist (chair follow d/t pt needing to sit immediately); limited distance ambulated d/t pt fatigue, pain, and SOB (O2 sats 91-94% on 2 L O2 via nasal cannula during session).  Pt's pain 6-7/10 R hip with ambulation during session.  Will continue to progress pt with strengthening, improving activity tolerance, and increasing ambulation distance per pt tolerance.   Follow Up Recommendations  SNF     Equipment Recommendations  Rolling walker with 5" wheels;3in1 (PT) (pt has RW and BSC at home already)    Recommendations for Other Services OT consult     Precautions / Restrictions Precautions Precautions: Fall;Anterior Hip Precaution Booklet Issued: Yes (comment) Precaution Comments: Urostomy; wound vac Restrictions Weight Bearing Restrictions: Yes RLE Weight Bearing: Weight bearing as tolerated    Mobility  Bed Mobility Overal bed mobility: Needs Assistance Bed Mobility: Supine to Sit     Supine to sit: Min assist;HOB elevated     General bed mobility comments: assist for R LE; heavy UE use of bedrails and increased effort and time to perform; vc's for technique  Transfers Overall transfer level: Needs assistance Equipment used: Rolling walker (2 wheeled) Transfers: Sit to/from Stand Sit to Stand: Mod assist;+2 physical assistance         General transfer comment: pt requiring multiple attempts and mod assist x2 to stand from bed; min to mod assist x2 to stand from recliner; vc's for hand placement/technique  Ambulation/Gait Ambulation/Gait assistance: Min  assist;Mod assist;+2 physical assistance Ambulation Distance (Feet):  (3 feet; 5 feet) Assistive device: Rolling walker (2 wheeled)   Gait velocity: decreased   General Gait Details: antalgic; decreased stance time R LE; decreased L LE foot clearance; vc's for technique and increasing UE support through [employer]    Modified Rankin (Stroke Patients Only)       Balance Overall balance assessment: Needs assistance Sitting-balance support: Bilateral upper extremity supported;Feet supported Sitting balance-Leahy Scale: Fair Sitting balance - Comments: static sitting   Standing balance support: Bilateral upper extremity supported (on RW) Standing balance-Leahy Scale: Fair Standing balance comment: static standing                            Cognition Arousal/Alertness: Awake/alert Behavior During Therapy: WFL for tasks assessed/performed Overall Cognitive Status: Within Functional Limits for tasks assessed                                        Exercises Total Joint Exercises Ankle Circles/Pumps: AROM;Strengthening;Both;10 reps;Supine Quad Sets: AROM;Strengthening;Both;10 reps;Supine Gluteal Sets: AROM;Strengthening;Both;10 reps;Supine Towel Squeeze: AROM;Strengthening;Both;10 reps;Supine (pillow between pt's knees to squeeze) Short Arc Quad: Strengthening;10 reps;Supine;Right;AAROM Heel Slides: Strengthening;10 reps;Supine;Right;AAROM Hip ABduction/ADduction: Strengthening;10 reps;Supine;Right;AAROM Straight Leg Raises: Strengthening;10 reps;Supine;Right;AAROM  Vc's required for above ex's technique.    General Comments General comments (skin integrity, edema, etc.): Wound vac and urostomy in place.  Nursing cleared pt for participation in physical therapy.  Pt agreeable to PT session.      Pertinent Vitals/Pain Pain Assessment: 0-10 Pain Score: 7  Pain Location: R hip Pain Descriptors / Indicators:  Sore;Tender Pain Intervention(s): Limited activity within patient's tolerance;Monitored during session;Premedicated before session;Repositioned;Ice applied (RN notified regarding pt's pain)  HR WFL during session.    Home Living                      Prior Function            PT Goals (current goals can now be found in the care plan section) Acute Rehab PT Goals Patient Stated Goal: to go home PT Goal Formulation: With patient Time For Goal Achievement: 03/17/17 Potential to Achieve Goals: Fair Progress towards PT goals: Progressing toward goals    Frequency    BID      PT Plan Current plan remains appropriate    Co-evaluation              AM-PAC PT "6 Clicks" Daily Activity  Outcome Measure  Difficulty turning over in bed (including adjusting bedclothes, sheets and blankets)?: Total Difficulty moving from lying on back to sitting on the side of the bed? : Total Difficulty sitting down on and standing up from a chair with arms (e.g., wheelchair, bedside commode, etc,.)?: Total Help needed moving to and from a bed to chair (including a wheelchair)?: Total Help needed walking in hospital room?: Total Help needed climbing 3-5 steps with a railing? : Total 6 Click Score: 6    End of Session Equipment Utilized During Treatment: Gait belt;Oxygen (2 L O2 via nasal cannula) Activity Tolerance: Patient limited by pain;Patient limited by fatigue Patient left: in chair;with call bell/phone within reach;with chair alarm set;with family/visitor present;with SCD's reapplied (B heels elevated via towel rolls; pt declining ice pack) Nurse Communication: Mobility status;Precautions;Weight bearing status (Pt's pain status) PT Visit Diagnosis: Muscle weakness (generalized) (M62.81);Difficulty in walking, not elsewhere classified (R26.2);Pain Pain - Right/Left: Right Pain - part of body: Hip     Time: 0912-0950 PT Time Calculation (min) (ACUTE ONLY): 38 min  Charges:   $Gait Training: 8-22 mins $Therapeutic Exercise: 8-22 mins $Therapeutic Activity: 8-22 mins                    G CodesHendricks Thomas:       Gary Thomas, PT 03/04/17, 10:04 AM [phone]

## 2017-03-04 NOTE — Progress Notes (Signed)
Physical Therapy Treatment Patient Details Name: Gary Thomas R [place] MRN: [id number] DOB: 1931/12/15 Today's Date: 03/04/2017    History of Present Illness Pt is an 83 y.o. male s/p R THA anterior approach 03/02/17 secondary to OA.  PMH includes SOB, DM, htn, depression, PE, renal insufficiency (has 1 kidney), h/o stroke with R sided weakness.    PT Comments    Pt resting in bed upon PT entry and pt declining OOB mobility or ex's in bed d/t fatigue and pain.  Pt stated that he wanted to stay in the "lift chair".  When pt educated that he was in a bed, pt became adamant that he was in a "lift chair" and wanted to prove this to the therapist and insistent on sitting on edge of "lift chair" (bed) to find "lift chair" remote (pt shown bed remote and bed controls but pt reported his remote for "lift chair" was "round"). Pt impulsive and difficult to redirect limiting session's activities d/t safety concerns.  Nursing notified and came in room during session; nursing observed pt's behavior and reported she would notify MD regarding concerns.   Follow Up Recommendations  SNF     Equipment Recommendations  Rolling walker with 5" wheels;3in1 (PT) (pt has RW and BSC at home already)    Recommendations for Other Services OT consult     Precautions / Restrictions Precautions Precautions: Fall;Anterior Hip Precaution Booklet Issued: Yes (comment) Precaution Comments: Urostomy; wound vac Restrictions Weight Bearing Restrictions: Yes RLE Weight Bearing: Weight bearing as tolerated    Mobility  Bed Mobility Overal bed mobility: Needs Assistance Bed Mobility: Supine to Sit     Supine to sit: Min assist;HOB elevated Sit to supine: Min assist;+2 for physical assistance;HOB elevated   General bed mobility comments: assist for R LE supine to sit; heavy UE use of bedrails and increased effort and time to perform; vc's for technique; 2 assist sit to supine for safety  Transfers                 General transfer comment: Deferred transfer d/t pt appearing confused and impulsive (safety concerns)  Ambulation/Gait                 Stairs            Wheelchair Mobility    Modified Rankin (Stroke Patients Only)       Balance Overall balance assessment: Needs assistance Sitting-balance support: Bilateral upper extremity supported;Feet supported Sitting balance-Leahy Scale: Fair Sitting balance - Comments: sitting reaching (CGA for safety)                                    Cognition Arousal/Alertness: Awake/alert Behavior During Therapy: Impulsive Overall Cognitive Status: Impaired/Different from baseline (Oriented x4 but appearing confused)                                 General Comments: Pt adamant that he is in a "lift chair" and not in a hospital bed (pt searching for "lift chair" remote throughout session).      Exercises      General Comments General comments (skin integrity, edema, etc.): Wound vac and urostomy in place.      Pertinent Vitals/Pain Pain Assessment: 0-10 Pain Score: 4  Pain Location: R hip Pain Descriptors / Indicators: Sore;Tender Pain Intervention(s): Limited activity within patient's tolerance;Monitored during  session;Premedicated before session;Repositioned  Vitals (HR and O2 on 2 L via nasal cannula) stable and WFL throughout treatment session.    Home Living                      Prior Function            PT Goals (current goals can now be found in the care plan section) Acute Rehab PT Goals Patient Stated Goal: to go home PT Goal Formulation: With patient Time For Goal Achievement: 03/17/17 Potential to Achieve Goals: Fair Progress towards PT goals: Progressing toward goals    Frequency    BID      PT Plan Current plan remains appropriate    Co-evaluation              AM-PAC PT "6 Clicks" Daily Activity  Outcome Measure  Difficulty turning over in bed  (including adjusting bedclothes, sheets and blankets)?: Total Difficulty moving from lying on back to sitting on the side of the bed? : Total Difficulty sitting down on and standing up from a chair with arms (e.g., wheelchair, bedside commode, etc,.)?: Total Help needed moving to and from a bed to chair (including a wheelchair)?: Total Help needed walking in hospital room?: Total Help needed climbing 3-5 steps with a railing? : Total 6 Click Score: 6    End of Session Equipment Utilized During Treatment: Oxygen (2 L O2 via nasal cannula) Activity Tolerance:  (Limited d/t safety concerns) Patient left: in bed;with call bell/phone within reach;with bed alarm set;with nursing/sitter in room;with SCD's reapplied (B heels elevated via towel rolls) Nurse Communication: Mobility status;Precautions;Weight bearing status (Pt's confusion) PT Visit Diagnosis: Muscle weakness (generalized) (M62.81);Difficulty in walking, not elsewhere classified (R26.2);Pain Pain - Right/Left: Right Pain - part of body: Hip     Time: 1610-9604 PT Time Calculation (min) (ACUTE ONLY): 23 min  Charges:  $Therapeutic Activity: 23-37 mins                    G CodesHendricks Limes, PT 03/04/17, 3:04 PM [phone]

## 2017-03-04 NOTE — Progress Notes (Signed)
[facility]. [facility] aware of [facility]. Clinical Social Worker (CSW) will continue to follow and assist as needed.   Baker Hughes IncorporatedBailey Hala Narula, LCSW [phone]

## 2017-03-05 LAB — GLUCOSE, CAPILLARY
Glucose-Capillary: 118 mg/dL — ABNORMAL HIGH (ref 65–99)
Glucose-Capillary: 119 mg/dL — ABNORMAL HIGH (ref 65–99)

## 2017-03-05 MED ORDER — HYDROCODONE-ACETAMINOPHEN 5-325 MG PO TABS: 1.0000 | ORAL_TABLET | ORAL | 0 refills | Status: DC | PRN

## 2017-03-05 NOTE — Clinical Social Work Placement (Signed)
   CLINICAL SOCIAL WORK PLACEMENT  NOTE  Date:  03/05/2017  Patient Details  Name: Gary DukeDanford R Gabrielson MRN: [id number] Date of Birth: 09-24-1932  Clinical Social Work is seeking post-discharge placement for this patient at the Skilled  Nursing Facility level of care (*CSW will initial, date and re-position this form in  chart as items are completed):  Yes   Patient/family provided with [place] Clinical Social Work Department's list of facilities offering this level of care within the geographic area requested by the patient (or if unable, by the patient's family).  Yes   Patient/family informed of their freedom to choose among providers that offer the needed level of care, that participate in Medicare, Medicaid or managed care program needed by the patient, have an available bed and are willing to accept the patient.  Yes   Patient/family informed of 's ownership interest in [facility] and [facility], as well as of the fact that they are under no obligation to receive care at these facilities.  PASRR submitted to EDS on       PASRR number received on       Existing PASRR number confirmed on 03/03/17     FL2 transmitted to all facilities in geographic area requested by pt/family on 03/03/17     FL2 transmitted to all facilities within larger geographic area on       Patient informed that his/her managed care company has contracts with or will negotiate with certain facilities, including the following:        Yes   Patient/family informed of bed offers received.  Patient chooses bed at  [facility] )     Physician recommends and patient chooses bed at      Patient to be transferred to  [facility] ) on 03/05/17.  Patient to be transferred to facility by  [facility] )     Patient family notified on 03/05/17 of transfer.  Name of family member notified:   (CSW attempted to contact patient's wife [name] however the phone number was not a  working number. )     PHYSICIAN       Additional Comment:    _______________________________________________ Tyrease Vandeberg, Darleen CrockerBailey M, LCSW 03/05/2017, 11:06 AM

## 2017-03-05 NOTE — Discharge Summary (Signed)
Physician Discharge Summary  Patient ID: Gary Thomas MRN: [id number] DOB/AGE: 05/17/1932 83 y.o.  Admit date: 03/02/2017 Discharge date: 03/05/2017  Admission Diagnoses:  PRIMARY OSTEOARTHRITIS OF RIGHT HIP   Discharge Diagnoses: Patient Active Problem List   Diagnosis Date Noted  . Primary localized osteoarthritis of right hip 03/02/2017    Past Medical History:  Diagnosis Date  . Depression   . Diabetes mellitus without complication (HCC)   . Dyspnea   . Elevated lipids   . GERD (gastroesophageal reflux disease)   . Hypertension   . Pulmonary emboli (HCC)   . Renal insufficiency    only has 1 kidney  . Stroke (HCC)    Rt side weakness     Transfusion: None   Consultants (if any):   Discharged Condition: Improved  Hospital Course: Gary DukeDanford R Brave is an 83 y.o. male who was admitted 03/02/2017 with a diagnosis of right hip osteoarthritis and went to the operating room on 03/02/2017 and underwent the above named procedures.    Surgeries: Procedure(s): TOTAL HIP ARTHROPLASTY ANTERIOR APPROACH on 03/02/2017 Patient tolerated the surgery well. Taken to PACU where she was stabilized and then transferred to the orthopedic floor.  Started on Lovenox 40 q 24 hrs. Foot pumps applied bilaterally at 80 mm. Heels elevated on bed with rolled towels. No evidence of DVT. Negative Homan. Physical therapy started on day #1 for gait training and transfer. OT started day #1 for ADL and assisted devices.  Patient's foley was d/c on day #1. Patient's IV was d/c on day #2.  On post op day #3 patient was stable and ready for discharge to rehabilitation facility.  Implants: Medacta AMIS 4 lateralized stem with L head and 56 mm Mpact DM cup and liner  He was given perioperative antibiotics:  Anti-infectives    Start     Dose/Rate Route Frequency Ordered Stop   03/02/17 2200  ceFAZolin (ANCEF) 2 g in dextrose 5 % 100 mL IVPB     2 g 200 mL/hr over 30 Minutes Intravenous Every 6 hours  03/02/17 1943 03/03/17 1322   03/02/17 1945  ceFAZolin (ANCEF) IVPB 2g/100 mL premix  Status:  Discontinued     2 g 200 mL/hr over 30 Minutes Intravenous Every 6 hours 03/02/17 1938 03/02/17 1942    .  He was given sequential compression devices, early ambulation, and Xarelto for DVT prophylaxis.  He benefited maximally from the hospital stay and there were no complications.    Recent vital signs:  Vitals:   03/04/17 2313 03/05/17 0725  BP: (!) 130/52 (!) 142/46  Pulse: 71 85  Resp: 18 16  Temp: 97.8 F (36.6 C) 98.1 F (36.7 C)    Recent laboratory studies:  Lab Results  Component Value Date   HGB 9.3 (L) 03/04/2017   HGB 11.6 (L) 03/03/2017   HGB 11.8 (L) 03/02/2017   Lab Results  Component Value Date   WBC 10.1 03/04/2017   PLT 101 (L) 03/04/2017   Lab Results  Component Value Date   INR 1.24 02/17/2017   Lab Results  Component Value Date   NA 137 03/04/2017   K 4.9 03/04/2017   CL 106 03/04/2017   CO2 27 03/04/2017   BUN 39 (H) 03/04/2017   CREATININE 1.94 (H) 03/04/2017   GLUCOSE 140 (H) 03/04/2017    Discharge Medications:   Allergies as of 03/05/2017      Reactions   Codeine Anaphylaxis   Morphine Anaphylaxis   Gabapentin Other (See  Comments)   Unsure of what the reaction was   Stadol [butorphanol]    Tapentadol Nausea Only   ONSET 01/04/2013   Lyrica [pregabalin] Rash      Medication List    STOP taking these medications   furosemide 40 MG tablet Commonly known as:  LASIX     TAKE these medications   atorvastatin 20 MG tablet Commonly known as:  LIPITOR Take 20 mg by mouth every evening.   beta carotene w/minerals tablet Take 1 tablet by mouth every evening.   HYDROcodone-acetaminophen 5-325 MG tablet Commonly known as:  NORCO/VICODIN Take 1-2 tablets by mouth every 4 (four) hours as needed for moderate pain.   losartan 25 MG tablet Commonly known as:  COZAAR Take 25 mg by mouth every evening.   metoprolol succinate 100 MG 24  hr tablet Commonly known as:  TOPROL-XL Take 100 mg by mouth every evening.   NOVOLOG MIX 70/30 FLEXPEN (70-30) 100 UNIT/ML FlexPen Generic drug:  insulin aspart protamine - aspart Inject 25 Units into the skin 2 (two) times daily.   ONE TOUCH ULTRA TEST test strip Generic drug:  glucose blood 1 strip by Other route 3 (three) times daily.   sertraline 100 MG tablet Commonly known as:  ZOLOFT Take 100 mg by mouth every evening.   XARELTO 15 MG Tabs tablet Generic drug:  Rivaroxaban Take 15 mg by mouth every evening.            Durable Medical Equipment        Start     Ordered   03/02/17 1939  DME Walker rolling  Once    Question:  Patient needs a walker to treat with the following condition  Answer:  Status post total hip replacement, right   03/02/17 1938   03/02/17 1939  DME 3 n 1  Once     03/02/17 1938   03/02/17 1939  DME Bedside commode  Once    Question:  Patient needs a bedside commode to treat with the following condition  Answer:  Status post total hip replacement, right   03/02/17 1938      Diagnostic Studies: Dg Hip Operative Unilat W Or W/o Pelvis Right  Result Date: 03/02/2017 CLINICAL DATA:  83 year old for hip replacement.  Initial encounter. EXAM: OPERATIVE right HIP (WITH PELVIS IF PERFORMED) 3 VIEWS TECHNIQUE: Fluoroscopic spot image(s) were submitted for interpretation post-operatively. COMPARISON:  None. FINDINGS: Total right hip replacement. Images centered at acetabular component without complication noted. Distal femoral aspect not imaged. IMPRESSION: Post right hip replacement as noted above. Electronically Signed   By: Lacy Duverney M.D.   On: 03/02/2017 18:45   Dg Hip Unilat W Or W/o Pelvis 2-3 Views Right  Result Date: 03/02/2017 CLINICAL DATA:  Postop right hip replacement EXAM: DG HIP (WITH OR WITHOUT PELVIS) 2-3V RIGHT COMPARISON:  None. FINDINGS: Status post right hip replacement with intact hardware and no malalignment. Cutaneous staples  laterally. Vascular calcification. Probable old fracture deformity of the mid shaft of the right femur IMPRESSION: Status post right hip replacement with expected postsurgical changes Electronically Signed   By: Jasmine Pang M.D.   On: 03/02/2017 18:27    Disposition:      Contact information for follow-up providers    Kennedy Bucker, MD Follow up in 2 week(s).   Specialty:  Orthopedic Surgery Why:  For staple removal and Steri-Strip application Contact information: [address] [facility] [place] [address] [phone]  Contact information for after-discharge care    Destination    [facility] Follow up.   Specialty:  Skilled Nursing Facility Contact information: [address] [place] [address] [phone]                   Signed: Patience Musca 03/05/2017, 7:39 AM

## 2017-03-05 NOTE — Progress Notes (Signed)
Patient is medically stable for D/[facility]. [facility] today to room 201[facility]A. RN will call report at [phone] and arrange EMS for transport. Clinical Child psychotherapistocial Worker (CSW) sent D/C orders to [employer] [facility] via [facility]. Patient is aware of above. CSW attempted to contact patient's wife [name] however the phone number was not a working number. Per patient [facility]. Please reconsult if future social work needs arise. CSW signing off.   [employer] Hughes IncorporatedBailey Faizan Geraci, LCSW [phone]

## 2017-03-05 NOTE — Progress Notes (Signed)
Called in to [facility] to [facility].

## 2017-03-05 NOTE — Progress Notes (Signed)
   Subjective: 3 Days Post-Op Procedure(s) (LRB): TOTAL HIP ARTHROPLASTY ANTERIOR APPROACH (Right) Patient reports pain as mild.   Patient is well, and has had no acute complaints or problems. Denies any CP, SOB, ABD pain. We will conitnue therapy today.  Plan is to go Skilled nursing facility after hospital stay.  Objective: Vital signs in last 24 hours: Temp:  [97.8 F (36.6 C)-98.1 F (36.7 C)] 98.1 F (36.7 C) (05/25 0725) Pulse Rate:  [71-85] 85 (05/25 0725) Resp:  [16-18] 16 (05/25 0725) BP: (130-142)/(46-52) 142/46 (05/25 0725) SpO2:  [91 %-92 %] 91 % (05/25 0725)  Intake/Output from previous day: 05/24 0701 - 05/25 0700 In: 772.5 [I.V.:772.5] Out: 1150 [Urine:1150] Intake/Output this shift: No intake/output data recorded.   Recent Labs  03/02/17 2224 03/03/17 0331 03/04/17 0403  HGB 11.8* 11.6* 9.3*    Recent Labs  03/03/17 0331 03/04/17 0403  WBC 15.5* 10.1  RBC 4.14* 3.28*  HCT 35.2* 27.7*  PLT 160 101*    Recent Labs  03/03/17 0331 03/04/17 0403  NA 139 137  K 5.2* 4.9  CL 106 106  CO2 28 27  BUN 35* 39*  CREATININE 1.94* 1.94*  GLUCOSE 164* 140*  CALCIUM 8.7* 8.2*   No results for input(s): LABPT, INR in the last 72 hours.  EXAM General - Patient is Alert, Appropriate and Oriented Extremity - Neurovascular intact Sensation intact distally Intact pulses distally Dorsiflexion/Plantar flexion intact No cellulitis present Compartment soft Dressing - dressing C/D/I and no drainage, wound vac intact Motor Function - intact, moving foot and toes well on exam.   Past Medical History:  Diagnosis Date  . Depression   . Diabetes mellitus without complication (HCC)   . Dyspnea   . Elevated lipids   . GERD (gastroesophageal reflux disease)   . Hypertension   . Pulmonary emboli (HCC)   . Renal insufficiency    only has 1 kidney  . Stroke (HCC)    Rt side weakness    Assessment/Plan:   3 Days Post-Op Procedure(s) (LRB): TOTAL HIP  ARTHROPLASTY ANTERIOR APPROACH (Right) Active Problems:   Primary localized osteoarthritis of right hip    Estimated body mass index is 36.22 kg/m as calculated from the following:   Height as of this encounter: 5\' 9"  (1.753 m).   Weight as of this encounter: 111.3 kg (245 lb 4.8 oz). Advance diet Up with therapy  Discharged to rehabilitation facility today. Follow-up with orthopedics in 2 weeks.  DVT Prophylaxis - Lovenox, Foot Pumps and TED hose Weight-Bearing as tolerated to right leg   T. Cranston Neighborhris Ivorie Uplinger, PA-C [facility] 03/05/2017, 7:32 AM

## 2017-03-05 NOTE — Progress Notes (Signed)
Patient is alert and oriented and able to verbalize needs. No complaints of pain at this time. Wife at bedside. Discharge instructions gone over with patient and wife at this time. EMS called for transport. Belongings packed up. Awaiting EMS arrival at this time.  Suzan SlickAlison L Staley Lunz, RN

## 2017-03-05 NOTE — Progress Notes (Signed)
Physical Therapy Treatment Patient Details Name: Gary Thomas R Schemm MRN: [id number] DOB: Jul 02, 1932 Today's Date: 03/05/2017    History of Present Illness Pt is an 83 y.o. male s/p R THA anterior approach 03/02/17 secondary to OA.  PMH includes SOB, DM, htn, depression, PE, renal insufficiency (has 1 kidney), h/o stroke with R sided weakness.    PT Comments    No confusion noted today compared to yesterday afternoon's session (confusion may have been related to pain medications). Pt did report that he was very fatigued today but agreeable to ex's in bed and getting to chair.  Pt still requiring 2 assist for transfers/ambulation using RW and demonstrating decreased WB'ing through R LE d/t hip pain.  Will continue to progress pt with strengthening and progressive ambulation distance per pt tolerance.   Follow Up Recommendations  SNF     Equipment Recommendations  Rolling walker with 5" wheels;3in1 (PT) (pt has RW and BSC at home already)    Recommendations for Other Services OT consult     Precautions / Restrictions Precautions Precautions: Fall;Anterior Hip Precaution Booklet Issued: Yes (comment) Precaution Comments: Urostomy; wound vac Restrictions Weight Bearing Restrictions: Yes RLE Weight Bearing: Weight bearing as tolerated    Mobility  Bed Mobility Overal bed mobility: Needs Assistance Bed Mobility: Supine to Sit     Supine to sit: Min assist;HOB elevated     General bed mobility comments: assist for R LE supine to sit; heavy UE use of bedrails and increased effort and time to perform; vc's for technique  Transfers Overall transfer level: Needs assistance Equipment used: Rolling walker (2 wheeled) Transfers: Sit to/from Stand Sit to Stand: Min assist;Mod assist;+2 physical assistance         General transfer comment: assist to initiate stand and come to full upright posture  Ambulation/Gait Ambulation/Gait assistance: Min assist;+2 physical  assistance Ambulation Distance (Feet): 3 Feet (bed to recliner) Assistive device: Rolling walker (2 wheeled)   Gait velocity: decreased   General Gait Details: antalgic; decreased stance time R LE; decreased L LE foot clearance; vc's for technique and increasing UE support through [employer]   Stairs            Wheelchair Mobility    Modified Rankin (Stroke Patients Only)       Balance Overall balance assessment: Needs assistance Sitting-balance support: Bilateral upper extremity supported;Feet supported Sitting balance-Leahy Scale: Fair Sitting balance - Comments: static sitting   Standing balance support: Bilateral upper extremity supported (on RW) Standing balance-Leahy Scale: Fair Standing balance comment: static standing                            Cognition Arousal/Alertness: Awake/alert Behavior During Therapy: WFL for tasks assessed/performed Overall Cognitive Status: Within Functional Limits for tasks assessed                                        Exercises Total Joint Exercises Ankle Circles/Pumps: AROM;Strengthening;Both;10 reps;Supine Quad Sets: AROM;Strengthening;Both;10 reps;Supine Gluteal Sets: AROM;Strengthening;Both;10 reps;Supine Towel Squeeze: AROM;Strengthening;Both;10 reps;Supine (pillow between pt's knees to squeeze) Short Arc Quad: Strengthening;10 reps;Supine;Right;AAROM Heel Slides: Strengthening;10 reps;Supine;Right;AAROM Hip ABduction/ADduction: Strengthening;10 reps;Supine;Right;AAROM Straight Leg Raises: Strengthening;10 reps;Supine;Right;AAROM  Vc's required for above exercises technique.    General Comments General comments (skin integrity, edema, etc.): Wound vac and urostomy in place.  Upon PT entering room and asking pt how he was feeling,  pt stated "I concede.  This is a bed".  Pt asking questions regarding his behavior yesterday and hoped he wasn't "mean".  Pt re-assured that everything was ok and pt  appearing very appreciative in general.      Pertinent Vitals/Pain Pain Assessment: 0-10 Pain Score: 4  Pain Location: R hip Pain Descriptors / Indicators: Sore;Tender Pain Intervention(s): Limited activity within patient's tolerance;Monitored during session;Repositioned (Pt declining pain meds)  Vitals (HR and O2 on 2 L via nasal cannula) stable and WFL throughout treatment session.    Home Living                      Prior Function            PT Goals (current goals can now be found in the care plan section) Acute Rehab PT Goals Patient Stated Goal: to go home PT Goal Formulation: With patient Time For Goal Achievement: 03/17/17 Potential to Achieve Goals: Fair Progress towards PT goals: Progressing toward goals    Frequency    BID      PT Plan Current plan remains appropriate    Co-evaluation              AM-PAC PT "6 Clicks" Daily Activity  Outcome Measure  Difficulty turning over in bed (including adjusting bedclothes, sheets and blankets)?: Total Difficulty moving from lying on back to sitting on the side of the bed? : Total Difficulty sitting down on and standing up from a chair with arms (e.g., wheelchair, bedside commode, etc,.)?: Total Help needed moving to and from a bed to chair (including a wheelchair)?: Total Help needed walking in hospital room?: Total Help needed climbing 3-5 steps with a railing? : Total 6 Click Score: 6    End of Session Equipment Utilized During Treatment: Gait belt;Oxygen (2 L O2 via nasal cannula) Activity Tolerance: Patient limited by fatigue;Patient limited by pain Patient left: in chair;with call bell/phone within reach;with chair alarm set;with SCD's reapplied (B heels elevated via towel rolls) Nurse Communication: Mobility status;Precautions;Weight bearing status (pt requesting nausea medication) PT Visit Diagnosis: Muscle weakness (generalized) (M62.81);Difficulty in walking, not elsewhere classified  (R26.2);Pain Pain - Right/Left: Right Pain - part of body: Hip     Time: 6295-2841 PT Time Calculation (min) (ACUTE ONLY): 24 min  Charges:  $Therapeutic Exercise: 8-22 mins $Therapeutic Activity: 8-22 mins                    G CodesHendricks Limes, PT 03/05/17, 10:21 AM [phone]

## 2017-03-05 NOTE — Discharge Instructions (Signed)

## 2017-03-09 ENCOUNTER — Other Ambulatory Visit
Admission: RE | Admit: 2017-03-09 | Discharge: 2017-03-09 | Disposition: A | Discharge status: Home / Self Care | Payer: No Typology Code available for payment source | Source: Ambulatory Visit | Attending: Gerontology | Admitting: Gerontology

## 2017-03-09 DIAGNOSIS — D649 Anemia, unspecified: Secondary | ICD-10-CM | POA: Insufficient documentation

## 2017-03-09 DIAGNOSIS — N189 Chronic kidney disease, unspecified: Secondary | ICD-10-CM | POA: Insufficient documentation

## 2017-03-09 LAB — GLUCOSE, CAPILLARY
GLUCOSE-CAPILLARY: 139 mg/dL — AB (ref 65–99)
GLUCOSE-CAPILLARY: 155 mg/dL — AB (ref 65–99)
GLUCOSE-CAPILLARY: 137 mg/dL — AB (ref 65–99)
GLUCOSE-CAPILLARY: 132 mg/dL — AB (ref 65–99)
GLUCOSE-CAPILLARY: 110 mg/dL — AB (ref 65–99)
GLUCOSE-CAPILLARY: 156 mg/dL — AB (ref 65–99)
Glucose-Capillary: 176 mg/dL — ABNORMAL HIGH (ref 65–99)
Glucose-Capillary: 144 mg/dL — ABNORMAL HIGH (ref 65–99)
Glucose-Capillary: 144 mg/dL — ABNORMAL HIGH (ref 65–99)
Glucose-Capillary: 132 mg/dL — ABNORMAL HIGH (ref 65–99)
Glucose-Capillary: 107 mg/dL — ABNORMAL HIGH (ref 65–99)
Glucose-Capillary: 104 mg/dL — ABNORMAL HIGH (ref 65–99)
Glucose-Capillary: 104 mg/dL — ABNORMAL HIGH (ref 65–99)
Glucose-Capillary: 131 mg/dL — ABNORMAL HIGH (ref 65–99)
Glucose-Capillary: 96 mg/dL (ref 65–99)
Glucose-Capillary: 122 mg/dL — ABNORMAL HIGH (ref 65–99)

## 2017-03-09 LAB — CBC WITH DIFFERENTIAL/PLATELET
Basophils Absolute: 0.1 10*3/uL (ref 0–0.1)
Basophils Relative: 1 %
EOS PCT: 2 %
Eosinophils Absolute: 0.1 10*3/uL (ref 0–0.7)
HCT: 27.2 % — ABNORMAL LOW (ref 40.0–52.0)
HEMOGLOBIN: 9 g/dL — AB (ref 13.0–18.0)
LYMPHS ABS: 0.7 10*3/uL — AB (ref 1.0–3.6)
Lymphocytes Relative: 9 %
MCH: 28.2 pg (ref 26.0–34.0)
MCHC: 32.9 g/dL (ref 32.0–36.0)
MCV: 85.9 fL (ref 80.0–100.0)
Monocytes Absolute: 0.6 10*3/uL (ref 0.2–1.0)
Monocytes Relative: 8 %
NEUTROS PCT: 80 %
Neutro Abs: 5.8 10*3/uL (ref 1.4–6.5)
Platelets: 185 10*3/uL (ref 150–440)
RBC: 3.17 MIL/uL — AB (ref 4.40–5.90)
RDW: 15.1 % — ABNORMAL HIGH (ref 11.5–14.5)
WBC: 7.3 10*3/uL (ref 3.8–10.6)

## 2017-03-09 LAB — COMPREHENSIVE METABOLIC PANEL
ALK PHOS: 74 U/L (ref 38–126)
ALT: 32 U/L (ref 17–63)
AST: 53 U/L — ABNORMAL HIGH (ref 15–41)
Albumin: 2.6 g/dL — ABNORMAL LOW (ref 3.5–5.0)
Anion gap: 6 (ref 5–15)
BUN: 28 mg/dL — ABNORMAL HIGH (ref 6–20)
CO2: 26 mmol/L (ref 22–32)
Calcium: 8.5 mg/dL — ABNORMAL LOW (ref 8.9–10.3)
Chloride: 105 mmol/L (ref 101–111)
Creatinine, Ser: 1.5 mg/dL — ABNORMAL HIGH (ref 0.61–1.24)
GFR, EST AFRICAN AMERICAN: 48 mL/min — AB (ref >60)
GFR, EST NON AFRICAN AMERICAN: 41 mL/min — AB (ref >60)
Glucose, Bld: 204 mg/dL — ABNORMAL HIGH (ref 65–99)
Potassium: 5 mmol/L (ref 3.5–5.1)
Sodium: 137 mmol/L (ref 135–145)
Total Bilirubin: 0.8 mg/dL (ref 0.3–1.2)
Total Protein: 5.9 g/dL — ABNORMAL LOW (ref 6.5–8.1)

## 2017-03-10 LAB — GLUCOSE, CAPILLARY
Glucose-Capillary: 95 mg/dL (ref 65–99)
Glucose-Capillary: 131 mg/dL — ABNORMAL HIGH (ref 65–99)

## 2017-03-11 LAB — GLUCOSE, CAPILLARY
GLUCOSE-CAPILLARY: 108 mg/dL — AB (ref 65–99)
GLUCOSE-CAPILLARY: 127 mg/dL — AB (ref 65–99)
GLUCOSE-CAPILLARY: 125 mg/dL — AB (ref 65–99)
Glucose-Capillary: 195 mg/dL — ABNORMAL HIGH (ref 65–99)

## 2017-03-12 ENCOUNTER — Encounter
Admission: RE | Admit: 2017-03-12 | Discharge: 2017-03-12 | Disposition: A | Discharge status: Home / Self Care | Payer: Medicare Other | Source: Ambulatory Visit | Attending: Internal Medicine | Admitting: Internal Medicine

## 2017-03-12 LAB — GLUCOSE, CAPILLARY
GLUCOSE-CAPILLARY: 92 mg/dL (ref 65–99)
Glucose-Capillary: 116 mg/dL — ABNORMAL HIGH (ref 65–99)

## 2017-04-07 ENCOUNTER — Encounter: Payer: Self-pay | Admitting: Internal Medicine

## 2017-04-07 ENCOUNTER — Ambulatory Visit (INDEPENDENT_AMBULATORY_CARE_PROVIDER_SITE_OTHER): Payer: Medicare Other | Admitting: Internal Medicine

## 2017-04-07 VITALS — BP 126/60 | HR 74 | Resp 16 | Ht 69.0 in | Wt 223.0 lb

## 2017-04-07 DIAGNOSIS — R0609 Other forms of dyspnea: Secondary | ICD-10-CM

## 2017-04-07 DIAGNOSIS — J9611 Chronic respiratory failure with hypoxia: Secondary | ICD-10-CM

## 2017-04-07 MED ORDER — TIOTROPIUM BROMIDE MONOHYDRATE 18 MCG IN CAPS: 18.0000 ug | ORAL_CAPSULE | Freq: Every day | RESPIRATORY_TRACT | 2 refills | Status: DC

## 2017-04-07 MED ORDER — UMECLIDINIUM-VILANTEROL 62.5-25 MCG/INH IN AEPB: 1.0000 | INHALATION_SPRAY | Freq: Every day | RESPIRATORY_TRACT | 0 refills | Status: DC

## 2017-04-07 NOTE — Progress Notes (Signed)
[facility]  Pulmonary Medicine Consultation      Assessment and Plan:  Patient is a 83 year old male with chronic progressive dyspnea on exertion for the last several years, now oxygen dependent with exertion.  Chronic hypoxic respiratory failure -Decent walk today, will start oxygen if necessary. -Uncertain as to cause of the patient's respiratory failure, this may include emphysema versus chronic vascular disease, versus cardiac disease. -We'll need to obtain the patient's outside echocardiogram, will order pulmonary function testing, chest x-ray.  Dyspnea on exertion. -Suspect that this is multifactorial, possibly due to restrictive disease and debility/deconditioning. -We'll start empiric Spiriva. -Continue rehabilitation, patient could be a good candidate for pulmonary rehabilitation after he is completed his post hip rehabilitation.  Date: 04/07/2017  MRN# [id number] Gary DukeDanford R Thomas 02-17-1932    Gary R Tonie GriffithBatten is a 83 y.o. old male seen in consultation for chief complaint of:    Chief Complaint  Patient presents with  . Advice Only    [facility] Maurine MinisterMiriam McLaughlin PA  . Shortness of Breath    pt desated from lobby to exam rm. He recovered back to 93%    HPI:   The patient is an 83 year old male who underwent right hip replacement on 03/02/17. He went to rehabilitation, but left early because he did not feel that he was improving. While at rehabilitation, he was noted that his oxygen was dropping while he was exerting himself, and he used oxygen periodically while he was exerting himself there. However, he had not required oxygen at time of discharge. He does have a history of sleep apnea, he uses oxygen while on CPAP. He also takes Xarelto 4. History of pulmonary embolism. 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 He has never been diagnosed with lung problems in the past. He uses his CPAP every night, 2L with PAP. He notes that  he is winded with exertion, such as walking around his home. When they go to the grocery he sits in the car. This has been present for several years, and appears to be getting worse according to his wife who gives some of the history.  He thinks he has had a echocardiogram in the past few years.    He smoked from that age of 83 to [age].   Desat walk on 04/07/17; at rest on RA on Ra is 94% and HR 72. After walking 180 feet his sat dropped to 82% and HR 79. Moderate dyspnea.    PMHX:   Past Medical History:  Diagnosis Date  . Depression   . Diabetes mellitus without complication (HCC)   . Dyspnea   . Elevated lipids   . GERD (gastroesophageal reflux disease)   . Hypertension   . Pulmonary emboli (HCC)   . Renal insufficiency    only has 1 kidney  . Stroke (HCC)    Rt side weakness   Surgical Hx:  Past Surgical History:  Procedure Laterality Date  . ADRENAL GLAND SURGERY Left   . AORTA SURGERY    . CAROTID ARTERY - SUBCLAVIAN ARTERY BYPASS GRAFT Bilateral   . CATARACT EXTRACTION W/ INTRAOCULAR LENS  IMPLANT, BILATERAL    . CHOLECYSTECTOMY    . FEMUR FRACTURE SURGERY Right   . HIP FRACTURE SURGERY Bilateral   . PROSTATE SURGERY    . REPLACEMENT TOTAL KNEE Right   . TOTAL HIP ARTHROPLASTY Right 03/02/2017   Procedure: TOTAL HIP ARTHROPLASTY ANTERIOR APPROACH;  Surgeon: Kennedy BuckerMenz, Michael, MD;  Location:  [facility] ORS;  Service: Orthopedics;  Laterality: Right;   Family Hx:  History reviewed. No pertinent family history. Social Hx:   Social History  Substance Use Topics  . Smoking status: Former Smoker    Quit date: 02/17/1954  . Smokeless tobacco: Former Neurosurgeon  . Alcohol use No   Medication:    Current Outpatient Prescriptions:  .  atorvastatin (LIPITOR) 20 MG tablet, Take 20 mg by mouth every evening., Disp: , Rfl: 3 .  beta carotene w/minerals (OCUVITE) tablet, Take 1 tablet by mouth every evening., Disp: , Rfl:  .  diclofenac sodium (VOLTAREN) 1 % GEL, Apply 1 application topically  4 (four) times daily., Disp: , Rfl: 11 .  furosemide (LASIX) 40 MG tablet, Take 40 mg by mouth daily., Disp: , Rfl: 3 .  HYDROcodone-acetaminophen (NORCO/VICODIN) 5-325 MG tablet, Take 1-2 tablets by mouth every 4 (four) hours as needed for moderate pain., Disp: 30 tablet, Rfl: 0 .  losartan (COZAAR) 25 MG tablet, Take 25 mg by mouth every evening., Disp: , Rfl:  .  metoprolol succinate (TOPROL-XL) 100 MG 24 hr tablet, Take 100 mg by mouth every evening., Disp: , Rfl:  .  NOVOLOG MIX 70/30 FLEXPEN (70-30) 100 UNIT/ML FlexPen, Inject 25 Units into the skin 2 (two) times daily., Disp: , Rfl:  .  ONE TOUCH ULTRA TEST test strip, 1 strip by Other route 3 (three) times daily., Disp: , Rfl: 3 .  sertraline (ZOLOFT) 100 MG tablet, Take 100 mg by mouth every evening., Disp: , Rfl: 3 .  XARELTO 15 MG TABS tablet, Take 15 mg by mouth every evening., Disp: , Rfl: 3   Allergies:  Codeine; Morphine; Gabapentin; Stadol [butorphanol]; Tapentadol; and Lyrica [pregabalin]  Review of Systems: Gen:  Denies  fever, sweats, chills HEENT: Denies blurred vision, double vision. bleeds, sore throat Cvc:  No dizziness, chest pain. Resp:   Denies cough or sputum production, shortness of breath Gi: Denies swallowing difficulty, stomach pain. Gu:  Denies bladder incontinence, burning urine Ext:   No Joint pain, stiffness. Skin: No skin rash,  hives  Endoc:  No polyuria, polydipsia. Psych: No depression, insomnia. Other:  All other systems were reviewed with the patient and were negative other that what is mentioned in the HPI.   Physical Examination:   VS: BP 126/60 (BP Location: Left Arm, Cuff Size: Large)   Pulse 74   Resp 16   Ht 5\' 9"  (1.753 m)   Wt 223 lb (101.2 kg)   SpO2 93%   BMI 32.93 kg/m   General Appearance: No distress  Neuro:without focal findings,  speech normal,  HEENT: PERRLA, EOM intact.   Pulmonary: normal breath sounds, No wheezing.  CardiovascularNormal S1,S2.  No m/r/g.   Abdomen:  Benign, Soft, non-tender. Renal:  No costovertebral tenderness  GU:  No performed at this time. Endoc: No evident thyromegaly, no signs of acromegaly. Skin:   warm, no rashes, no ecchymosis  Extremities: normal, no cyanosis, clubbing.  Other findings:    LABORATORY PANEL:   CBC No results for input(s): WBC, HGB, HCT, PLT in the last 168 hours. ------------------------------------------------------------------------------------------------------------------  Chemistries  No results for input(s): NA, K, CL, CO2, GLUCOSE, BUN, CREATININE, CALCIUM, MG, AST, ALT, ALKPHOS, BILITOT in the last 168 hours.  Invalid input(s): GFRCGP ------------------------------------------------------------------------------------------------------------------  Cardiac Enzymes No results for input(s): TROPONINI in the last 168 hours. ------------------------------------------------------------  RADIOLOGY:  No results found.     Thank  you for the consultation and for allowing [facility] [place]  Pulmonary, Critical Care to assist in the care of your patient. Our recommendations are noted above.  Please contact us if we can be of further service.   Wells Guiles, MD.  Board Certified in Internal Medicine, Pulmonary Medicine, Critical Care Medicine, and Sleep Medicine.  [facility] Office Number: [phone]  Santiago Glad, M.D.  Billy Fischer, M.D  04/07/2017

## 2017-04-07 NOTE — Addendum Note (Signed)
Addended by: Alease FrameARTER, SONYA S on: 04/07/2017 01:51 PM   Modules accepted: Orders

## 2017-04-07 NOTE — Patient Instructions (Signed)
--  Will need to obtain copy of your recent echocardiogram.   --Will give sample of anoro inhaler if you notice it helps your breathing, call us back for a prescription.   --Will check CXR.

## 2017-05-11 ENCOUNTER — Ambulatory Visit: Payer: Medicare Other | Attending: Internal Medicine

## 2017-05-11 DIAGNOSIS — J9611 Chronic respiratory failure with hypoxia: Secondary | ICD-10-CM

## 2017-05-11 MED ORDER — ALBUTEROL SULFATE (2.5 MG/3ML) 0.083% IN NEBU
2.5000 mg | INHALATION_SOLUTION | Freq: Once | RESPIRATORY_TRACT | Status: AC
  Administered 2017-05-11: 2.5 mg via RESPIRATORY_TRACT
  Filled 2017-05-11: qty 3

## 2017-05-13 NOTE — Progress Notes (Signed)
[facility] [place] [facility] Consultation      Assessment and Plan:  Patient is a 83 year old male with chronic progressive dyspnea on exertion for the last several years, now oxygen dependent with exertion.  Emphysema.  --Moderate emphysema, with reversibility on point function testing. FEV1 equals 65%. -Given his continued dyspnea on exertion. We discussed that this may be secondary to multiple factors, as well as emphysema. -I've given him a sample of Trelegy inhaler to be used once daily in place as the Spiriva for 2 weeks. If he notices a significant difference. We can give him a prescription for a LABA/LAMA inhaler2 years.  Chronic hypoxic respiratory failure -Stable, multifactorial from vascular (history of PE) and chronic respiratory disease.  Dyspnea on exertion. -Suspect that this is multifactorial, possibly due to restrictive lung disease from elevated right diaphragm, as well as debility/deconditioning and emphysema. -Continue rehabilitation, patient could be a good candidate for pulmonary rehabilitation in the future.  Date: 05/13/2017  MRN# [id number] Gary Thomas 10-04-1932    Gary Thomas is a 83 y.o. old male seen in consultation for chief complaint of:    Chief Complaint  Patient presents with  . Follow-up    PFt/CT results: breathing same; no changes    HPI:   The patient is an 83 year old male who underwent right hip replacement on 03/02/17. He went to rehabilitation, but left early because he did not feel that he was improving. While at rehabilitation, he was noted that his oxygen was dropping while he was exerting himself, and he used oxygen periodically while he was exerting himself there. However, he had not required oxygen at time of discharge. He does have a history of sleep apnea, he uses oxygen while on CPAP. He also takes Xarelto for history of pulmonary embolism. He also has a history of vascular disease with a subclavian artery bypass  graft.  At last visit he was sent for a CXR, started on Anoro, and requested outside echo results which were unremarkable. Since his last visit he has been on rehab and spiriva, he does not notice a difference in his breathing. He still has to walk with a walker.   Chest x-ray 05/17/2017, personally reviewed today. This shows that the lungs are unremarkable, there is a chronically elevated right diaphragm which, on review of old films goes back to at least 2009.  He smoked from that age of 89 to [age].   **Echo 08/06/16; EF=55%.  **PFT 04/23/2017; ratio equal 61%, FEV1 equals 65%, TLC was 88%, DLCO = 73%. Overall this test is consistent with moderate emphysema with evidence of reversibility. **Desat walk on 04/07/17; at rest on RA on Ra is 94% and HR 72. After walking 180 feet his sat dropped to 82% and HR 79. Moderate dyspnea.   Medication:    Current Outpatient Prescriptions:  .  atorvastatin (LIPITOR) 20 MG tablet, Take 20 mg by mouth every evening., Disp: , Rfl: 3 .  beta carotene w/minerals (OCUVITE) tablet, Take 1 tablet by mouth every evening., Disp: , Rfl:  .  diclofenac sodium (VOLTAREN) 1 % GEL, Apply 1 application topically 4 (four) times daily., Disp: , Rfl: 11 .  furosemide (LASIX) 40 MG tablet, Take 40 mg by mouth daily., Disp: , Rfl: 3 .  HYDROcodone-acetaminophen (NORCO/VICODIN) 5-325 MG tablet, Take 1-2 tablets by mouth every 4 (four) hours as needed for moderate pain., Disp: 30 tablet, Rfl: 0 .  losartan (COZAAR) 25 MG tablet, Take 25 mg by mouth every evening., Disp: ,  Rfl:  .  metoprolol succinate (TOPROL-XL) 100 MG 24 hr tablet, Take 100 mg by mouth every evening., Disp: , Rfl:  .  NOVOLOG MIX 70/30 FLEXPEN (70-30) 100 UNIT/ML FlexPen, Inject 25 Units into the skin 2 (two) times daily., Disp: , Rfl:  .  ONE TOUCH ULTRA TEST test strip, 1 strip by Other route 3 (three) times daily., Disp: , Rfl: 3 .  sertraline (ZOLOFT) 100 MG tablet, Take 100 mg by mouth every evening., Disp:  , Rfl: 3 .  tiotropium (SPIRIVA HANDIHALER) 18 MCG inhalation capsule, Place 1 capsule (18 mcg total) into inhaler and inhale daily., Disp: 30 capsule, Rfl: 2 .  umeclidinium-vilanterol (ANORO ELLIPTA) 62.5-25 MCG/INH AEPB, Inhale 1 puff into the lungs daily., Disp: 60 each, Rfl: 0 .  XARELTO 15 MG TABS tablet, Take 15 mg by mouth every evening., Disp: , Rfl: 3   Allergies:  Codeine; Morphine; Gabapentin; Stadol [butorphanol]; Tapentadol; and Lyrica [pregabalin]  Review of Systems: Gen:  Denies  fever, sweats, chills HEENT: Denies blurred vision, double vision. bleeds, sore throat Cvc:  No dizziness, chest pain. Resp:   Denies cough or sputum production, shortness of breath Gi: Denies swallowing difficulty, stomach pain. Gu:  Denies bladder incontinence, burning urine Ext:   No Joint pain, stiffness. Skin: No skin rash,  hives  Endoc:  No polyuria, polydipsia. Psych: No depression, insomnia. Other:  All other systems were reviewed with the patient and were negative other that what is mentioned in the HPI.   Physical Examination:   VS: BP 120/60   Pulse 85   Ht 5\' 9"  (1.753 m)   Wt 223 lb (101.2 kg)   SpO2 90%   BMI 32.93 kg/m   General Appearance: No distress  Neuro:without focal findings,  speech normal,  HEENT: PERRLA, EOM intact.   Pulmonary: normal breath sounds, No wheezing.  CardiovascularNormal S1,S2.  No m/r/g.   Abdomen: Benign, Soft, non-tender. Renal:  No costovertebral tenderness  GU:  No performed at this time. Endoc: No evident thyromegaly, no signs of acromegaly. Skin:   warm, no rashes, no ecchymosis  Extremities: normal, no cyanosis, clubbing.  Other findings:    LABORATORY PANEL:   CBC No results for input(s): WBC, HGB, HCT, PLT in the last 168 hours. ------------------------------------------------------------------------------------------------------------------  Chemistries  No results for input(s): NA, K, CL, CO2, GLUCOSE, BUN, CREATININE,  CALCIUM, MG, AST, ALT, ALKPHOS, BILITOT in the last 168 hours.  Invalid input(s): GFRCGP ------------------------------------------------------------------------------------------------------------------  Cardiac Enzymes No results for input(s): TROPONINI in the last 168 hours. ------------------------------------------------------------  RADIOLOGY:  No results found.     Thank  you for the consultation and for allowing [facility] to assist in the care of your patient. Our recommendations are noted above.  Please contact us if we can be of further service.   Wells Guileseep Harlee Pursifull, MD.  Board Certified in Internal Medicine, Pulmonary Medicine, Critical Care Medicine, and Sleep Medicine.  [facility] Office Number: [phone]  Santiago Gladavid Kasa, M.D.  Billy Fischeravid Simonds, M.D  05/13/2017

## 2017-05-17 ENCOUNTER — Ambulatory Visit
Admission: RE | Admit: 2017-05-17 | Discharge: 2017-05-17 | Disposition: A | Discharge status: Home / Self Care | Payer: Medicare Other | Source: Ambulatory Visit | Attending: Internal Medicine | Admitting: Internal Medicine

## 2017-05-17 ENCOUNTER — Ambulatory Visit (INDEPENDENT_AMBULATORY_CARE_PROVIDER_SITE_OTHER): Payer: Medicare Other | Admitting: Internal Medicine

## 2017-05-17 ENCOUNTER — Encounter: Payer: Self-pay | Admitting: Internal Medicine

## 2017-05-17 VITALS — BP 120/60 | HR 85 | Ht 69.0 in | Wt 223.0 lb

## 2017-05-17 DIAGNOSIS — J9611 Chronic respiratory failure with hypoxia: Secondary | ICD-10-CM | POA: Insufficient documentation

## 2017-05-17 DIAGNOSIS — J986 Disorders of diaphragm: Secondary | ICD-10-CM | POA: Diagnosis not present

## 2017-05-17 DIAGNOSIS — J439 Emphysema, unspecified: Secondary | ICD-10-CM | POA: Diagnosis not present

## 2017-05-17 DIAGNOSIS — I700 Atherosclerosis of aorta: Secondary | ICD-10-CM | POA: Diagnosis not present

## 2017-05-17 MED ORDER — FLUTICASONE-UMECLIDIN-VILANT 100-62.5-25 MCG/INH IN AEPB: 1.0000 | INHALATION_SPRAY | Freq: Every day | RESPIRATORY_TRACT | 0 refills | Status: DC

## 2017-05-17 MED ORDER — FLUTICASONE-UMECLIDIN-VILANT 100-62.5-25 MCG/INH IN AEPB: 1.0000 | INHALATION_SPRAY | Freq: Every day | RESPIRATORY_TRACT | 5 refills | Status: DC

## 2017-05-17 NOTE — Patient Instructions (Addendum)
Try sample of Trelegy inhaler that you were given today for 2 weeks. Use once daily for 2 weeks, if it works call us back and we will prescribe a similar medicine for you.   --After you use the inhaler rinse your mouth.

## 2017-05-29 ENCOUNTER — Emergency Department: Payer: Medicare Other

## 2017-05-29 ENCOUNTER — Emergency Department
Admission: EM | Admit: 2017-05-29 | Discharge: 2017-05-29 | Disposition: A | Discharge status: Home / Self Care | Payer: Medicare Other | Attending: Emergency Medicine | Admitting: Emergency Medicine

## 2017-05-29 ENCOUNTER — Encounter: Payer: Self-pay | Admitting: Emergency Medicine

## 2017-05-29 DIAGNOSIS — Y939 Activity, unspecified: Secondary | ICD-10-CM | POA: Diagnosis not present

## 2017-05-29 DIAGNOSIS — X58XXXA Exposure to other specified factors, initial encounter: Secondary | ICD-10-CM | POA: Diagnosis not present

## 2017-05-29 DIAGNOSIS — Z8673 Personal history of transient ischemic attack (TIA), and cerebral infarction without residual deficits: Secondary | ICD-10-CM | POA: Diagnosis not present

## 2017-05-29 DIAGNOSIS — E119 Type 2 diabetes mellitus without complications: Secondary | ICD-10-CM | POA: Diagnosis not present

## 2017-05-29 DIAGNOSIS — S8011XA Contusion of right lower leg, initial encounter: Secondary | ICD-10-CM | POA: Diagnosis not present

## 2017-05-29 DIAGNOSIS — I10 Essential (primary) hypertension: Secondary | ICD-10-CM | POA: Insufficient documentation

## 2017-05-29 DIAGNOSIS — Z794 Long term (current) use of insulin: Secondary | ICD-10-CM | POA: Insufficient documentation

## 2017-05-29 DIAGNOSIS — Y999 Unspecified external cause status: Secondary | ICD-10-CM | POA: Diagnosis not present

## 2017-05-29 DIAGNOSIS — Z87891 Personal history of nicotine dependence: Secondary | ICD-10-CM | POA: Diagnosis not present

## 2017-05-29 DIAGNOSIS — Z96641 Presence of right artificial hip joint: Secondary | ICD-10-CM | POA: Diagnosis not present

## 2017-05-29 DIAGNOSIS — N289 Disorder of kidney and ureter, unspecified: Secondary | ICD-10-CM | POA: Insufficient documentation

## 2017-05-29 DIAGNOSIS — Y929 Unspecified place or not applicable: Secondary | ICD-10-CM | POA: Diagnosis not present

## 2017-05-29 DIAGNOSIS — Z79899 Other long term (current) drug therapy: Secondary | ICD-10-CM | POA: Insufficient documentation

## 2017-05-29 DIAGNOSIS — M79661 Pain in right lower leg: Secondary | ICD-10-CM | POA: Diagnosis present

## 2017-05-29 DIAGNOSIS — Z96659 Presence of unspecified artificial knee joint: Secondary | ICD-10-CM | POA: Diagnosis not present

## 2017-05-29 LAB — COMPREHENSIVE METABOLIC PANEL
ALT: 15 U/L — ABNORMAL LOW (ref 17–63)
ANION GAP: 8 (ref 5–15)
AST: 24 U/L (ref 15–41)
Albumin: 3.7 g/dL (ref 3.5–5.0)
Alkaline Phosphatase: 77 U/L (ref 38–126)
BUN: 48 mg/dL — AB (ref 6–20)
CALCIUM: 8.9 mg/dL (ref 8.9–10.3)
CHLORIDE: 105 mmol/L (ref 101–111)
CO2: 27 mmol/L (ref 22–32)
CREATININE: 1.8 mg/dL — AB (ref 0.61–1.24)
GFR calc non Af Amer: 33 mL/min — ABNORMAL LOW (ref >60)
GFR, EST AFRICAN AMERICAN: 38 mL/min — AB (ref >60)
Glucose, Bld: 131 mg/dL — ABNORMAL HIGH (ref 65–99)
Potassium: 4.5 mmol/L (ref 3.5–5.1)
SODIUM: 140 mmol/L (ref 135–145)
Total Bilirubin: 0.7 mg/dL (ref 0.3–1.2)
Total Protein: 6.7 g/dL (ref 6.5–8.1)

## 2017-05-29 LAB — CBC WITH DIFFERENTIAL/PLATELET
BASOS ABS: 0.1 10*3/uL (ref 0–0.1)
Basophils Relative: 1 %
Eosinophils Absolute: 0.1 10*3/uL (ref 0–0.7)
Eosinophils Relative: 2 %
HEMATOCRIT: 35.2 % — AB (ref 40.0–52.0)
Hemoglobin: 11.8 g/dL — ABNORMAL LOW (ref 13.0–18.0)
LYMPHS PCT: 15 %
Lymphs Abs: 1 10*3/uL (ref 1.0–3.6)
MCH: 27.8 pg (ref 26.0–34.0)
MCHC: 33.5 g/dL (ref 32.0–36.0)
MCV: 82.9 fL (ref 80.0–100.0)
MONO ABS: 0.8 10*3/uL (ref 0.2–1.0)
Monocytes Relative: 11 %
NEUTROS ABS: 5.1 10*3/uL (ref 1.4–6.5)
NEUTROS PCT: 71 %
Platelets: 150 10*3/uL (ref 150–440)
RBC: 4.25 MIL/uL — ABNORMAL LOW (ref 4.40–5.90)
RDW: 15.3 % — AB (ref 11.5–14.5)
WBC: 7.1 10*3/uL (ref 3.8–10.6)

## 2017-05-29 LAB — PROTIME-INR
INR: 1.36
Prothrombin Time: 16.9 seconds — ABNORMAL HIGH (ref 11.4–15.2)

## 2017-05-29 LAB — APTT: aPTT: 33 seconds (ref 24–36)

## 2017-05-29 NOTE — ED Notes (Signed)
Patient transported to Ultrasound 

## 2017-05-29 NOTE — ED Provider Notes (Signed)
[facility] Emergency Department Provider Note  ____________________________________________  Time seen: Approximately 10:06 AM  I have reviewed the triage vital signs and the nursing notes.   HISTORY  Chief Complaint Leg Pain    HPI Gary Thomas is a 83 y.o. male who presents to emergency Department from home for complaint of right upper leg pain, swelling, ecchymosis. Patient reports that he has a hip replacement 3 months prior. He was performing physical therapy yesterday and was unable to complete all of his squats due to pain. Patient denies any definitive injury. He was seen by his primary care yesterday with none of these symptoms being present at that time. Patient is now experiencing pain, swelling, ecchymosis with a large "lump" to the lateral aspect of the femur. Patient denies any numbness or tingling distally. Again, no trauma or falls. Patient is on anticoagulation therapy since his surgery. Patient denies any headache, visual changes, chest pain, shortness of breath, abdominal pain, nausea or vomiting at this time.  Patient has a history of diabetes mellitus, GERD, hypertension, PE, renal insufficiency, CVA.   Past Medical History:  Diagnosis Date  . Depression   . Diabetes mellitus without complication (HCC)   . Dyspnea   . Elevated lipids   . GERD (gastroesophageal reflux disease)   . Hypertension   . Pulmonary emboli (HCC)   . Renal insufficiency    only has 1 kidney  . Stroke (HCC)    Rt side weakness    Patient Active Problem List   Diagnosis Date Noted  . Primary localized osteoarthritis of right hip 03/02/2017    Past Surgical History:  Procedure Laterality Date  . ADRENAL GLAND SURGERY Left   . AORTA SURGERY    . CAROTID ARTERY - SUBCLAVIAN ARTERY BYPASS GRAFT Bilateral   . CATARACT EXTRACTION W/ INTRAOCULAR LENS  IMPLANT, BILATERAL    . CHOLECYSTECTOMY    . FEMUR FRACTURE SURGERY Right   . HIP FRACTURE SURGERY  Bilateral   . PROSTATE SURGERY    . REPLACEMENT TOTAL KNEE Right   . TOTAL HIP ARTHROPLASTY Right 03/02/2017   Procedure: TOTAL HIP ARTHROPLASTY ANTERIOR APPROACH;  Surgeon: Kennedy Bucker, MD;  Location: [facility] ORS;  Service: Orthopedics;  Laterality: Right;    Prior to Admission medications   Medication Sig Start Date End Date Taking? Authorizing Provider  atorvastatin (LIPITOR) 20 MG tablet Take 20 mg by mouth every evening. 02/02/17   [provider]  beta carotene w/minerals (OCUVITE) tablet Take 1 tablet by mouth every evening.    [provider]  diclofenac sodium (VOLTAREN) 1 % GEL Apply 1 application topically 4 (four) times daily. 03/24/17   [provider]  Fluticasone-Umeclidin-Vilant (TRELEGY ELLIPTA) 100-62.5-25 MCG/INH AEPB Inhale 1 puff into the lungs daily. 05/17/17   Shane Crutch, MD  furosemide (LASIX) 40 MG tablet Take 40 mg by mouth daily. 02/09/17   [provider]  HYDROcodone-acetaminophen (NORCO/VICODIN) 5-325 MG tablet Take 1-2 tablets by mouth every 4 (four) hours as needed for moderate pain. 03/05/17   Evon Slack, PA-C  losartan (COZAAR) 25 MG tablet Take 25 mg by mouth every evening. 01/14/17   [provider]  metoprolol succinate (TOPROL-XL) 100 MG 24 hr tablet Take 100 mg by mouth every evening. 02/12/16   [provider]  NOVOLOG MIX 70/30 FLEXPEN (70-30) 100 UNIT/ML FlexPen Inject 25 Units into the skin 2 (two) times daily. 11/20/16   [provider]  ONE TOUCH ULTRA TEST test strip 1  strip by Other route 3 (three) times daily. 12/02/16   [provider]  sertraline (ZOLOFT) 100 MG tablet Take 100 mg by mouth every evening. 01/23/17   [provider]  tiotropium (SPIRIVA HANDIHALER) 18 MCG inhalation capsule Place 1 capsule (18 mcg total) into inhaler and inhale daily. 04/07/17 04/07/18  Shane Crutch, MD  umeclidinium-vilanterol (ANORO ELLIPTA) 62.5-25 MCG/INH AEPB Inhale 1 puff  into the lungs daily. 04/07/17   Shane Crutch, MD  XARELTO 15 MG TABS tablet Take 15 mg by mouth every evening. 01/23/17   [provider]    Allergies Codeine; Morphine; Gabapentin; Stadol [butorphanol]; Tapentadol; and Lyrica [pregabalin]  No family history on file.  Social History Social History  Substance Use Topics  . Smoking status: Former Smoker    Quit date: 02/17/1954  . Smokeless tobacco: Former Neurosurgeon  . Alcohol use No     Review of Systems  Constitutional: No fever/chills Eyes: No visual changes.  Cardiovascular: no chest pain. Respiratory: no cough. No SOB. Gastrointestinal: No abdominal pain.  No nausea, no vomiting.  Musculoskeletal: Positive for her pain, edema, ecchymosis to the right femur and knee. Skin: Negative for rash, abrasions, lacerations, ecchymosis. Neurological: Negative for headaches, focal weakness or numbness. 10-point ROS otherwise negative.  ____________________________________________   PHYSICAL EXAM:  VITAL SIGNS: ED Triage Vitals  Enc Vitals Group     BP 05/29/17 0956 114/64     Pulse Rate 05/29/17 0956 (!) 59     Resp 05/29/17 0956 20     Temp 05/29/17 0956 (!) 97.5 F (36.4 C)     Temp Source 05/29/17 0956 Oral     SpO2 05/29/17 0956 97 %     Weight 05/29/17 0952 223 lb (101.2 kg)     Height 05/29/17 0952 5\' 9"  (1.753 m)     Head Circumference --      Peak Flow --      Pain Score 05/29/17 0952 5     Pain Loc --      Pain Edu? --      Excl. in GC? --      Constitutional: Alert and oriented. Well appearing and in no acute distress. Eyes: Conjunctivae are normal. PERRL. EOMI. Head: Atraumatic. ENT:      Ears:       Nose: No congestion/rhinnorhea.      Mouth/Throat: Mucous membranes are moist.  Neck: No stridor.    Cardiovascular: Normal rate, regular rhythm. Normal S1 and S2.  Good peripheral circulation. Respiratory: Normal respiratory effort without tachypnea or retractions. Lungs CTAB. Good air entry  to the bases with no decreased or absent breath sounds. Musculoskeletal: Limited range of motion to the right lower extremity. On visualization, significant ecchymosis, edema noted to the right femur extending into the right knee. Ecchymosis is from the lateral to posterior aspect of the femur. Palpable abnormality is noted to the distal third of the right femur. This palpable abnormalities on the lateral aspect of the femur. Patient has no tenderness to palpation over the osseous structures of the knee. Knee replacement has been performed, and as such no stress testing is performed. Patient is nontender to palpation over the proximal femur or hip. No palpable abnormality. Dorsalis pedis pulse intact distally. Sensation intact in all dermatomal distributions distally. Neurologic:  Normal speech and language. No gross focal neurologic deficits are appreciated.  Skin:  Skin is warm, dry and intact. No rash noted. Psychiatric: Mood and affect are normal. Speech and behavior are normal. Patient  exhibits appropriate insight and judgement.   ____________________________________________   LABS (all labs ordered are listed, but only abnormal results are displayed)  Labs Reviewed  COMPREHENSIVE METABOLIC PANEL - Abnormal; Notable for the following:       Result Value   Glucose, Bld 131 (*)    BUN 48 (*)    Creatinine, Ser 1.80 (*)    ALT 15 (*)    GFR calc non Af Amer 33 (*)    GFR calc Af Amer 38 (*)    All other components within normal limits  CBC WITH DIFFERENTIAL/PLATELET - Abnormal; Notable for the following:    RBC 4.25 (*)    Hemoglobin 11.8 (*)    HCT 35.2 (*)    RDW 15.3 (*)    All other components within normal limits  PROTIME-INR - Abnormal; Notable for the following:    Prothrombin Time 16.9 (*)    All other components within normal limits  APTT   ____________________________________________  EKG   ____________________________________________  RADIOLOGY Festus Barren  Cuthriell, personally viewed and evaluated these images (plain radiographs) as part of my medical decision making, as well as reviewing the written report by the radiologist.  US Venous Img Lower Unilateral Right  Result Date: 05/29/2017 CLINICAL DATA:  Swelling and ecchymosis to the right thigh. Status post hip arthroplasty 3 months prior. EXAM: RIGHT LOWER EXTREMITY VENOUS DOPPLER ULTRASOUND TECHNIQUE: Gray-scale sonography with graded compression, as well as color Doppler and duplex ultrasound were performed to evaluate the lower extremity deep venous systems from the level of the common femoral vein and including the common femoral, femoral, profunda femoral, popliteal and calf veins including the posterior tibial, peroneal and gastrocnemius veins when visible. The superficial great saphenous vein was also interrogated. Spectral Doppler was utilized to evaluate flow at rest and with distal augmentation maneuvers in the common femoral, femoral and popliteal veins. COMPARISON:  None. FINDINGS: Contralateral Common Femoral Vein: Respiratory phasicity is normal and symmetric with the symptomatic side. No evidence of thrombus. Normal compressibility. Common Femoral Vein: No evidence of thrombus. Normal compressibility, respiratory phasicity and response to augmentation. Saphenofemoral Junction: No evidence of thrombus. Normal compressibility and flow on color Doppler imaging. Profunda Femoral Vein: No evidence of thrombus. Normal compressibility and flow on color Doppler imaging. Femoral Vein: No evidence of thrombus. Normal compressibility, respiratory phasicity and response to augmentation. Popliteal Vein: No evidence of thrombus. Normal compressibility, respiratory phasicity and response to augmentation. Calf Veins: No evidence of thrombus. Normal compressibility and flow on color Doppler imaging. Superficial Great Saphenous Vein: No evidence of thrombus. Normal compressibility and flow on color Doppler  imaging. Venous Reflux:  None. Other Findings:  None. IMPRESSION: No evidence of DVT within the right lower extremity. Electronically Signed   By: Delbert Phenix M.D.   On: 05/29/2017 12:10   Dg Knee Complete 4 Views Right  Result Date: 05/29/2017 CLINICAL DATA:  Right knee pain.  No reported injury. EXAM: RIGHT KNEE - COMPLETE 4+ VIEW COMPARISON:  12/08/2012 FINDINGS: No fracture.  No bone lesion. Knee prosthetic components are well-seated and well-aligned. No joint effusion. Bones are demineralized. There are vascular calcifications posteriorly. Soft tissues are otherwise unremarkable. IMPRESSION: 1. No fracture, dislocation or acute finding. 2. No evidence of loosening of the orthopedic hardware. Electronically Signed   By: Amie Portland M.D.   On: 05/29/2017 10:52   Dg Hip Unilat W Or Wo Pelvis 2-3 Views Right  Result Date: 05/29/2017 CLINICAL DATA:  No known injury, bruising behind  right knee. Hip surgery 5-18, knee revision 2+ yrs ago EXAM: DG HIP (WITH OR WITHOUT PELVIS) 2-3V RIGHT COMPARISON:  03/02/2017 FINDINGS: No acute fracture. Right hip prosthesis appears well seated and aligned. There is an old healed fracture of the mid right femoral diaphysis. A line of sclerosis crosses the proximal left femur from the intertrochanteric region to the femoral neck consistent with previous hip pinning. The left hip joint, SI joints and symphysis pubis are normally spaced and aligned. Bones are demineralized. Surgical vascular clips are noted in the lower pelvis. There are vascular calcifications in the pelvis and medial thighs. IMPRESSION: 1. No fracture, dislocation or acute finding. 2. Well-seated well-aligned right hip hemiarthroplasty. Electronically Signed   By: Amie Portland M.D.   On: 05/29/2017 10:50    ____________________________________________    PROCEDURES  Procedure(s) performed:    Procedures       Medications - No data to  display   ____________________________________________   INITIAL IMPRESSION / ASSESSMENT AND PLAN / ED COURSE  Pertinent labs & imaging results that were available during my care of the patient were reviewed by me and considered in my medical decision making (see chart for details).  Review of the [place] CSRS was performed in accordance of the [facility] prior to dispensing any controlled drugs.     Patient's diagnosis is consistent with ecchymosis to the right upper leg. Patient presented to the emergency department complaining of pain, ecchymosis, edema to the right upper leg. Patient has a history of hip and knee replacement to affected extremity. Replacement was 3 months prior. Patient reports that he was unable to finish his physical therapy yesterday due to pain on performing squats. Patient presents today with a ecchymosis, pain, edema to the right femur region. Initially, exam was concerning for hematoma versus muscle tear versus damage to existing hardware of hip and knee replacement versus DVT. Patient was evaluated with labs and imaging. Labs returned with reassuring results, abnormal findings are consistent with several previously documented lab draws. Patient is an stage III kidney failure, mildly anemic. X-ray returns with no acute osseous abnormality and no loosening of in place hardware. Ultrasound reveals no DVT. At this time, diagnosis is consistent with partial muscle tear with hematoma versus complete tear muscle. At this time, patient is stable to be discharged with follow-up with orthopedics. He is to abstain from physical therapy until cleared by orthopedics to return.. Patient is to take Tylenol as needed for pain. Patient is to continue all of his regularly prescribed medications including Xarelto. Patient will call orthopedics in 2 days. Patient is given ED precautions to return to the ED for any worsening or new symptoms.     ____________________________________________  FINAL  CLINICAL IMPRESSION(S) / ED DIAGNOSES  Final diagnoses:  Hematoma of right lower extremity, initial encounter      NEW MEDICATIONS STARTED DURING THIS VISIT:  New Prescriptions   No medications on file        This chart was dictated using voice recognition software/Dragon. Despite best efforts to proofread, errors can occur which can change the meaning. Any change was purely unintentional.    Racheal Patches, PA-C 05/29/17 1228    Governor Rooks, MD 05/29/17 [phone]

## 2017-05-29 NOTE — ED Triage Notes (Signed)
Presents with pain to right hip area  States he noticed large amt of bruising and swelling to right knee and upper leg   Had hip replacement in may  And conts to go to PT

## 2017-07-01 ENCOUNTER — Other Ambulatory Visit (INDEPENDENT_AMBULATORY_CARE_PROVIDER_SITE_OTHER): Payer: Self-pay | Admitting: Vascular Surgery

## 2017-07-01 DIAGNOSIS — I779 Disorder of arteries and arterioles, unspecified: Secondary | ICD-10-CM

## 2017-07-01 DIAGNOSIS — I739 Peripheral vascular disease, unspecified: Principal | ICD-10-CM

## 2017-07-05 ENCOUNTER — Encounter (INDEPENDENT_AMBULATORY_CARE_PROVIDER_SITE_OTHER): Payer: Self-pay | Admitting: Vascular Surgery

## 2017-07-05 ENCOUNTER — Ambulatory Visit (INDEPENDENT_AMBULATORY_CARE_PROVIDER_SITE_OTHER): Payer: Medicare Other

## 2017-07-05 ENCOUNTER — Ambulatory Visit (INDEPENDENT_AMBULATORY_CARE_PROVIDER_SITE_OTHER): Payer: Medicare Other | Admitting: Vascular Surgery

## 2017-07-05 VITALS — BP 138/74 | HR 57 | Resp 16 | Wt 225.0 lb

## 2017-07-05 DIAGNOSIS — I10 Essential (primary) hypertension: Secondary | ICD-10-CM | POA: Diagnosis not present

## 2017-07-05 DIAGNOSIS — E785 Hyperlipidemia, unspecified: Secondary | ICD-10-CM | POA: Diagnosis not present

## 2017-07-05 DIAGNOSIS — I779 Disorder of arteries and arterioles, unspecified: Secondary | ICD-10-CM | POA: Diagnosis not present

## 2017-07-05 DIAGNOSIS — I6523 Occlusion and stenosis of bilateral carotid arteries: Secondary | ICD-10-CM

## 2017-07-05 DIAGNOSIS — I739 Peripheral vascular disease, unspecified: Principal | ICD-10-CM

## 2017-07-05 NOTE — Progress Notes (Signed)
Subjective:    Patient ID: Gary Thomas, male    DOB: Jul 07, 1932, 83 y.o.   MRN: [id number] Chief Complaint  Patient presents with  . Carotid    58yr follow up   Patient presents for a yearly carotid stenosis follow-up. He presents today without complaint. The patient denies experiencing Amaurosis Fugax, TIA like symptoms or focal motor deficits. He underwent a bilateral carotid artery duplex exam which was notable for: 40-59% stenosis of the right middle internal carotid artery graft, 1-39% stenosis of the left internal carotid artery, known occlusion of the bilateral external carotid arteries and antegrade flow of the vertebral arteries. When compared to his previous exam on 06/29/2016 the bilateral graft restenosis is stable. He denies any fever, nausea or vomiting.   Review of Systems  Constitutional: Negative.   HENT: Negative.   Eyes: Negative.   Respiratory: Negative.   Cardiovascular: Negative.   Gastrointestinal: Negative.   Endocrine: Negative.   Genitourinary: Negative.   Musculoskeletal: Negative.   Skin: Negative.   Allergic/Immunologic: Negative.   Neurological: Negative.   Hematological: Negative.   Psychiatric/Behavioral: Negative.       Objective:   Physical Exam  Constitutional: He is oriented to person, place, and time. He appears well-developed and well-nourished. No distress.  HENT:  Head: Normocephalic and atraumatic.  Eyes: Pupils are equal, round, and reactive to light. Conjunctivae are normal.  Neck: Normal range of motion.  No carotid bruits noted  Cardiovascular: Normal rate, regular rhythm, normal heart sounds and intact distal pulses.   Pulses:      Radial pulses are 2+ on the right side, and 2+ on the left side.  Pulmonary/Chest: Effort normal.  Musculoskeletal: He exhibits no edema.  Neurological: He is alert and oriented to person, place, and time.  Skin: Skin is warm and dry. He is not diaphoretic.  Psychiatric: He has a normal mood and  affect. His behavior is normal. Judgment and thought content normal.  Vitals reviewed.  BP 138/74 (BP Location: Left Arm)   Pulse (!) 57   Resp 16   Wt 225 lb (102.1 kg)   BMI 33.23 kg/m   Past Medical History:  Diagnosis Date  . Depression   . Diabetes mellitus without complication (HCC)   . Dyspnea   . Elevated lipids   . GERD (gastroesophageal reflux disease)   . Hypertension   . Pulmonary emboli (HCC)   . Renal insufficiency    only has 1 kidney  . Stroke (HCC)    Rt side weakness    Social History   Social History  . Marital status: Married    Spouse name: N/A  . Number of children: N/A  . Years of education: N/A   Occupational History  . Not on file.   Social History Main Topics  . Smoking status: Former Smoker    Quit date: 02/17/1954  . Smokeless tobacco: Former Neurosurgeon  . Alcohol use No  . Drug use: No  . Sexual activity: Not on file   Other Topics Concern  . Not on file   Social History Narrative  . No narrative on file    Past Surgical History:  Procedure Laterality Date  . ADRENAL GLAND SURGERY Left   . AORTA SURGERY    . CAROTID ARTERY - SUBCLAVIAN ARTERY BYPASS GRAFT Bilateral   . CATARACT EXTRACTION W/ INTRAOCULAR LENS  IMPLANT, BILATERAL    . CHOLECYSTECTOMY    . FEMUR FRACTURE SURGERY Right   . HIP FRACTURE  SURGERY Bilateral   . PROSTATE SURGERY    . REPLACEMENT TOTAL KNEE Right   . TOTAL HIP ARTHROPLASTY Right 03/02/2017   Procedure: TOTAL HIP ARTHROPLASTY ANTERIOR APPROACH;  Surgeon: Kennedy Bucker, MD;  Location: [facility] ORS;  Service: Orthopedics;  Laterality: Right;    Family History  Problem Relation Age of Onset  . Heart attack Father   . Stroke Father   . Heart disease Father     Allergies  Allergen Reactions  . Codeine Anaphylaxis  . Morphine Anaphylaxis  . Gabapentin Other (See Comments)    Other reaction(s): Other (See Comments), Unknown Unsure of what the reaction was Unsure of what the reaction was  . Stadol  [Butorphanol]     Other reaction(s): Unknown  . Tapentadol Nausea Only    ONSET 01/04/2013  . Lyrica [Pregabalin] Rash       Assessment & Plan:  Patient presents for a yearly carotid stenosis follow-up. He presents today without complaint. The patient denies experiencing Amaurosis Fugax, TIA like symptoms or focal motor deficits. He underwent a bilateral carotid artery duplex exam which was notable for: 40-59% stenosis of the right middle internal carotid artery graft, 1-39% stenosis of the left internal carotid artery, known occlusion of the bilateral external carotid arteries and antegrade flow of the vertebral arteries. When compared to his previous exam on 06/29/2016 the bilateral graft restenosis is stable. He denies any fever, nausea or vomiting.  1. Bilateral carotid artery stenosis - Stable Studies reviewed with patient. Patient asymptomatic with stable duplex. No intervention at this time. Patient to return in one year for surveillance carotid duplex. Patient to remain abstinent of tobacco use. I have discussed with the patient at length the risk factors for and pathogenesis of atherosclerotic disease and encouraged a healthy diet, regular exercise regimen and blood pressure / glucose control.  Patient was instructed to contact our office in the interim with problems such as arm / leg weakness or numbness, speech / swallowing difficulty or temporary monocular blindness. The patient expresses their understanding.  - VAS US CAROTID; Future  2. Hyperlipidemia, unspecified hyperlipidemia type - Stable Encouraged good control as its slows the progression of atherosclerotic disease  3. Essential hypertension - Stable Encouraged good control as its slows the progression of atherosclerotic disease  Current Outpatient Prescriptions on File Prior to Visit  Medication Sig Dispense Refill  . atorvastatin (LIPITOR) 20 MG tablet Take 20 mg by mouth every evening.  3  . beta carotene  w/minerals (OCUVITE) tablet Take 1 tablet by mouth every evening.    . furosemide (LASIX) 40 MG tablet Take 40 mg by mouth daily.  3  . losartan (COZAAR) 25 MG tablet Take 25 mg by mouth every evening.    . metoprolol succinate (TOPROL-XL) 100 MG 24 hr tablet Take 100 mg by mouth every evening.    Marland Kitchen NOVOLOG MIX 70/30 FLEXPEN (70-30) 100 UNIT/ML FlexPen Inject 25 Units into the skin 2 (two) times daily.    . ONE TOUCH ULTRA TEST test strip 1 strip by Other route 3 (three) times daily.  3  . sertraline (ZOLOFT) 100 MG tablet Take 100 mg by mouth every evening.  3  . XARELTO 15 MG TABS tablet Take 15 mg by mouth every evening.  3  . diclofenac sodium (VOLTAREN) 1 % GEL Apply 1 application topically 4 (four) times daily.  11  . Fluticasone-Umeclidin-Vilant (TRELEGY ELLIPTA) 100-62.5-25 MCG/INH AEPB Inhale 1 puff into the lungs daily. (Patient not taking: Reported on 07/05/2017)  60 each 0  . HYDROcodone-acetaminophen (NORCO/VICODIN) 5-325 MG tablet Take 1-2 tablets by mouth every 4 (four) hours as needed for moderate pain. (Patient not taking: Reported on 07/05/2017) 30 tablet 0  . tiotropium (SPIRIVA HANDIHALER) 18 MCG inhalation capsule Place 1 capsule (18 mcg total) into inhaler and inhale daily. (Patient not taking: Reported on 07/05/2017) 30 capsule 2  . umeclidinium-vilanterol (ANORO ELLIPTA) 62.5-25 MCG/INH AEPB Inhale 1 puff into the lungs daily. (Patient not taking: Reported on 07/05/2017) 60 each 0   No current facility-administered medications on file prior to visit.     There are no Patient Instructions on file for this visit. No Follow-up on file.   Jasslyn Finkel A Chantz Montefusco, PA-C

## 2017-08-09 ENCOUNTER — Encounter (INDEPENDENT_AMBULATORY_CARE_PROVIDER_SITE_OTHER): Payer: Self-pay | Admitting: Vascular Surgery

## 2017-08-09 ENCOUNTER — Ambulatory Visit (INDEPENDENT_AMBULATORY_CARE_PROVIDER_SITE_OTHER): Payer: Medicare Other | Admitting: Vascular Surgery

## 2017-08-09 VITALS — BP 136/70 | HR 65 | Resp 16 | Ht 69.0 in | Wt 223.0 lb

## 2017-08-09 DIAGNOSIS — E785 Hyperlipidemia, unspecified: Secondary | ICD-10-CM | POA: Diagnosis not present

## 2017-08-09 DIAGNOSIS — I10 Essential (primary) hypertension: Secondary | ICD-10-CM

## 2017-08-09 DIAGNOSIS — I70223 Atherosclerosis of native arteries of extremities with rest pain, bilateral legs: Secondary | ICD-10-CM

## 2017-08-09 DIAGNOSIS — I6523 Occlusion and stenosis of bilateral carotid arteries: Secondary | ICD-10-CM

## 2017-08-09 NOTE — Progress Notes (Signed)
MRN : [id number]  Gary Thomas is a 83 y.o. (26-Jun-1932) male who presents with chief complaint of  Chief Complaint  Patient presents with  . New Patient (Initial Visit)    Bilateral leg pain  .  History of Present Illness: The patient returns to the office for followup of PAD. There has been a significant deterioration in the lower extremity symptoms.  The patient notes interval shortening of their claudication distance and development of mild rest pain symptoms. No new ulcers or wounds have occurred since the last visit.  There have been no significant changes to the patient's overall health care.  The patient denies amaurosis fugax or recent TIA symptoms. There are no recent neurological changes noted. The patient denies history of DVT, PE or superficial thrombophlebitis. The patient denies recent episodes of angina or shortness of breath.    Current Meds  Medication Sig  . atorvastatin (LIPITOR) 20 MG tablet Take 20 mg by mouth every evening.  . beta carotene w/minerals (OCUVITE) tablet Take 1 tablet by mouth every evening.  . diclofenac sodium (VOLTAREN) 1 % GEL Apply 1 application topically 4 (four) times daily.  . Fluticasone-Umeclidin-Vilant (TRELEGY ELLIPTA) 100-62.5-25 MCG/INH AEPB Inhale 1 puff into the lungs daily.  . furosemide (LASIX) 40 MG tablet Take 40 mg by mouth daily.  Marland Kitchen. losartan (COZAAR) 25 MG tablet Take 25 mg by mouth every evening.  . Lutein-Zeaxanthin (OCUVITE LUTEIN 25) 25-5 MG CAPS Take by mouth.  . metoprolol succinate (TOPROL-XL) 100 MG 24 hr tablet Take 100 mg by mouth every evening.  Marland Kitchen. NOVOLOG MIX 70/30 FLEXPEN (70-30) 100 UNIT/ML FlexPen Inject 25 Units into the skin 2 (two) times daily.  . ONE TOUCH ULTRA TEST test strip 1 strip by Other route 3 (three) times daily.  . sertraline (ZOLOFT) 100 MG tablet Take 100 mg by mouth every evening.  . tiotropium (SPIRIVA HANDIHALER) 18 MCG inhalation capsule Place 1 capsule (18 mcg total) into inhaler and  inhale daily.  Marland Kitchen. umeclidinium-vilanterol (ANORO ELLIPTA) 62.5-25 MCG/INH AEPB Inhale 1 puff into the lungs daily.  Carlena Hurl. XARELTO 15 MG TABS tablet Take 15 mg by mouth every evening.    Past Medical History:  Diagnosis Date  . Depression   . Diabetes mellitus without complication (HCC)   . Dyspnea   . Elevated lipids   . GERD (gastroesophageal reflux disease)   . Hypertension   . Pulmonary emboli (HCC)   . Renal insufficiency    only has 1 kidney  . Stroke (HCC)    Rt side weakness    Past Surgical History:  Procedure Laterality Date  . ADRENAL GLAND SURGERY Left   . AORTA SURGERY    . CAROTID ARTERY - SUBCLAVIAN ARTERY BYPASS GRAFT Bilateral   . CATARACT EXTRACTION W/ INTRAOCULAR LENS  IMPLANT, BILATERAL    . CHOLECYSTECTOMY    . FEMUR FRACTURE SURGERY Right   . HIP FRACTURE SURGERY Bilateral   . PROSTATE SURGERY    . REPLACEMENT TOTAL KNEE Right   . TOTAL HIP ARTHROPLASTY Right 03/02/2017   Procedure: TOTAL HIP ARTHROPLASTY ANTERIOR APPROACH;  Surgeon: Kennedy BuckerMenz, Michael, MD;  Location: [facility] ORS;  Service: Orthopedics;  Laterality: Right;    Social History Social History  Substance Use Topics  . Smoking status: Former Smoker    Quit date: 02/17/1954  . Smokeless tobacco: Former NeurosurgeonUser  . Alcohol use No    Family History Family History  Problem Relation Age of Onset  . Heart attack Father   .  Stroke Father   . Heart disease Father     Allergies  Allergen Reactions  . Codeine Anaphylaxis  . Morphine Anaphylaxis  . Gabapentin Other (See Comments)    Other reaction(s): Other (See Comments), Unknown Unsure of what the reaction was Unsure of what the reaction was  . Stadol [Butorphanol]     Other reaction(s): Unknown  . Tapentadol Nausea Only    ONSET 01/04/2013  . Lyrica [Pregabalin] Rash     REVIEW OF SYSTEMS (Negative unless checked)  Constitutional: [] Weight loss  [] Fever  [] Chills Cardiac: [] Chest pain   [] Chest pressure   [] Palpitations   [] Shortness of breath  when laying flat   [] Shortness of breath with exertion. Vascular:  [x] Pain in legs with walking   [x] Pain in legs at rest  [] History of DVT   [] Phlebitis   [] Swelling in legs   [] Varicose veins   [] Non-healing ulcers Pulmonary:   [] Uses home oxygen   [] Productive cough   [] Hemoptysis   [] Wheeze  [] COPD   [] Asthma Neurologic:  [] Dizziness   [] Seizures   [] History of stroke   [] History of TIA  [] Aphasia   [] Vissual changes   [] Weakness or numbness in arm   [] Weakness or numbness in leg Musculoskeletal:   [] Joint swelling   [] Joint pain   [] Low back pain Hematologic:  [] Easy bruising  [] Easy bleeding   [] Hypercoagulable state   [] Anemic Gastrointestinal:  [] Diarrhea   [] Vomiting  [] Gastroesophageal reflux/heartburn   [] Difficulty swallowing. Genitourinary:  [] Chronic kidney disease   [] Difficult urination  [] Frequent urination   [] Blood in urine Skin:  [] Rashes   [] Ulcers  Psychological:  [] History of anxiety   []  History of major depression.  Physical Examination  Vitals:   08/09/17 0851  BP: 136/70  Pulse: 65  Resp: 16  Weight: 223 lb (101.2 kg)  Height: 5\' 9"  (1.753 m)   Body mass index is 32.93 kg/m. Gen: WD/WN, NAD Head: [place]/AT, No temporalis wasting.  Ear/Nose/Throat: Hearing grossly intact, nares w/o erythema or drainage Eyes: PER, EOMI, sclera nonicteric.  Neck: Supple, no large masses.   Pulmonary:  Good air movement, no audible wheezing bilaterally, no use of accessory muscles.  Cardiac: RRR, no JVD Vascular:  Vessel Right Left  Radial Palpable Palpable  PT Not Palpable Not Palpable  DP Not Palpable Not Palpable  Gastrointestinal: Non-distended. No guarding/no peritoneal signs.  Musculoskeletal: M/S 5/5 throughout.  No deformity or atrophy.  Neurologic: CN 2-12 intact. Symmetrical.  Speech is fluent. Motor exam as listed above. Psychiatric: Judgment intact, Mood & affect appropriate for pt's clinical situation. Dermatologic: No rashes or ulcers noted.  No changes  consistent with cellulitis. Lymph : No lichenification or skin changes of chronic lymphedema.  CBC Lab Results  Component Value Date   WBC 7.1 05/29/2017   HGB 11.8 (L) 05/29/2017   HCT 35.2 (L) 05/29/2017   MCV 82.9 05/29/2017   PLT 150 05/29/2017    BMET    Component Value Date/Time   NA 140 05/29/2017 1038   NA 137 12/12/2012 0422   K 4.5 05/29/2017 1038   K 4.4 12/12/2012 0422   CL 105 05/29/2017 1038   CL 103 12/12/2012 0422   CO2 27 05/29/2017 1038   CO2 28 12/12/2012 0422   GLUCOSE 131 (H) 05/29/2017 1038   GLUCOSE 130 (H) 12/12/2012 0422   BUN 48 (H) 05/29/2017 1038   BUN 36 (H) 12/12/2012 0422   CREATININE 1.80 (H) 05/29/2017 1038   CREATININE 1.75 (H) 12/12/2012 0422  CALCIUM 8.9 05/29/2017 1038   CALCIUM 8.6 12/12/2012 0422   GFRNONAA 33 (L) 05/29/2017 1038   GFRNONAA 36 (L) 12/12/2012 0422   GFRAA 38 (L) 05/29/2017 1038   GFRAA 42 (L) 12/12/2012 0422   CrCl cannot be calculated (Patient's most recent lab result is older than the maximum 21 days allowed.).  COAG Lab Results  Component Value Date   INR 1.36 05/29/2017   INR 1.24 02/17/2017   INR 2.3 12/12/2012    Radiology No results found.   Assessment/Plan 1. Atherosclerosis of native artery of both lower extremities with rest pain (HCC) Recommend:  Patient should undergo arterial duplex of the lower extremity ASAP because there has been a significant deterioration in the patient's lower extremity symptoms.  The patient states they are having increased pain and a marked decrease in the distance that they can walk.  The risks and benefits as well as the alternatives were discussed in detail with the patient.  All questions were answered.  Patient agrees to proceed and understands this could be a prelude to angiography and intervention.  The patient will follow up with me in the office to review the studies.    A total of 30 minutes was spent with this patient and greater than 50% was spent in  counseling and coordination of care with the patient.  Discussion included the treatment options for vascular disease including indications for surgery and intervention.  Also discussed is the appropriate timing of treatment.  In addition medical therapy was discussed.  - VAS US AORTA/IVC/ILIACS; Future - VAS [place] LOWER EXTREMITY ARTERIAL DUPLEX; Future  2. Bilateral carotid artery stenosis Recommend:  Given the patient's asymptomatic subcritical stenosis no further invasive testing or surgery at this time.  Continue antiplatelet therapy as prescribed Continue management of CAD, HTN and Hyperlipidemia Healthy heart diet,  encouraged exercise at least 4 times per week  Follow up with duplex ultrasound and physical exam   3. Essential hypertension Continue antihypertensive medications as already ordered, these medications have been reviewed and there are no changes at this time.   4. Hyperlipidemia, unspecified hyperlipidemia type Continue statin as ordered and reviewed, no changes at this time     Levora Dredge, MD  08/09/2017 8:34 PM

## 2017-08-18 ENCOUNTER — Other Ambulatory Visit (INDEPENDENT_AMBULATORY_CARE_PROVIDER_SITE_OTHER): Payer: Self-pay | Admitting: Vascular Surgery

## 2017-08-18 ENCOUNTER — Encounter (INDEPENDENT_AMBULATORY_CARE_PROVIDER_SITE_OTHER): Payer: Self-pay

## 2017-08-18 DIAGNOSIS — I739 Peripheral vascular disease, unspecified: Secondary | ICD-10-CM

## 2017-08-19 ENCOUNTER — Encounter (INDEPENDENT_AMBULATORY_CARE_PROVIDER_SITE_OTHER): Payer: Self-pay

## 2017-08-19 ENCOUNTER — Encounter (INDEPENDENT_AMBULATORY_CARE_PROVIDER_SITE_OTHER): Payer: Self-pay | Admitting: Vascular Surgery

## 2017-08-19 ENCOUNTER — Ambulatory Visit (INDEPENDENT_AMBULATORY_CARE_PROVIDER_SITE_OTHER): Payer: Medicare Other

## 2017-08-19 ENCOUNTER — Ambulatory Visit (INDEPENDENT_AMBULATORY_CARE_PROVIDER_SITE_OTHER): Payer: Medicare Other | Admitting: Vascular Surgery

## 2017-08-19 ENCOUNTER — Other Ambulatory Visit (INDEPENDENT_AMBULATORY_CARE_PROVIDER_SITE_OTHER): Payer: Medicare Other

## 2017-08-19 VITALS — BP 127/69 | HR 76 | Resp 16 | Wt 220.0 lb

## 2017-08-19 DIAGNOSIS — I10 Essential (primary) hypertension: Secondary | ICD-10-CM

## 2017-08-19 DIAGNOSIS — I70223 Atherosclerosis of native arteries of extremities with rest pain, bilateral legs: Secondary | ICD-10-CM | POA: Diagnosis not present

## 2017-08-19 DIAGNOSIS — I6523 Occlusion and stenosis of bilateral carotid arteries: Secondary | ICD-10-CM | POA: Diagnosis not present

## 2017-08-19 DIAGNOSIS — E785 Hyperlipidemia, unspecified: Secondary | ICD-10-CM | POA: Diagnosis not present

## 2017-08-19 DIAGNOSIS — I739 Peripheral vascular disease, unspecified: Secondary | ICD-10-CM

## 2017-08-19 NOTE — Progress Notes (Signed)
Subjective:    Patient ID: Gary Thomas, male    DOB: October 30, 1931, 83 y.o.   MRN: [id number] Chief Complaint  Patient presents with  . Follow-up    Aorta iliac   Patient presents to review vascular studies.  The patient was last seen on August 09, 2017 for evaluation pain with and without activity to the bilateral thighs.  Patient notes he had a right hip replacement done earlier in the year and has not been able to walk without a walker since.  These symptoms are stable.  The patient underwent a bilateral ABI which was notable for right triphasic blood flow noted through the femoral, popliteal, posterior tibial, and anterior tibial arteries.  Left: Biphasic through the femoral artery transitioning to triphasic through the popliteal, posterior tibial, anterior tibial.  The patient underwent an aortoiliac arterial duplex exam which was notable for Doppler velocities suggesting less than 50% stenosis of the bilateral common iliac and external arteries.  No hemodynamically significant stenosis noted of the abdominal aorta arteries.  There is triphasic blood flow to the right iliac arteries.  There is triphasic blood flow, and the left iliac arteries with biphasic to the right external iliac arteries.  The patient denies any fever nausea vomiting.   Review of Systems  Constitutional: Negative.   HENT: Negative.   Eyes: Negative.   Respiratory: Negative.   Cardiovascular:       Bilateral thigh pain  Gastrointestinal: Negative.   Endocrine: Negative.   Genitourinary: Negative.   Musculoskeletal: Negative.   Skin: Negative.   Allergic/Immunologic: Negative.   Neurological: Negative.   Hematological: Negative.   Psychiatric/Behavioral: Negative.       Objective:   Physical Exam  Constitutional: He is oriented to person, place, and time. He appears well-developed and well-nourished. No distress.  HENT:  Head: Normocephalic and atraumatic.  Eyes: Conjunctivae are normal. Pupils are  equal, round, and reactive to light.  Neck: Normal range of motion.  Cardiovascular: Normal rate, regular rhythm and normal heart sounds.  Pulses:      Radial pulses are 2+ on the right side, and 2+ on the left side.  Hard to palpate pedal pulses  Pulmonary/Chest: Effort normal and breath sounds normal.  Musculoskeletal: Normal range of motion. He exhibits no edema.  Neurological: He is alert and oriented to person, place, and time.  Skin: Skin is warm and dry. He is not diaphoretic.  Psychiatric: He has a normal mood and affect. His behavior is normal. Judgment and thought content normal.  Vitals reviewed.  BP 127/69 (BP Location: Right Arm)   Pulse 76   Resp 16   Wt 220 lb (99.8 kg)   BMI 32.49 kg/m   Past Medical History:  Diagnosis Date  . Depression   . Diabetes mellitus without complication (HCC)   . Dyspnea   . Elevated lipids   . GERD (gastroesophageal reflux disease)   . Hypertension   . Pulmonary emboli (HCC)   . Renal insufficiency    only has 1 kidney  . Stroke (HCC)    Rt side weakness   Social History   Socioeconomic History  . Marital status: Married    Spouse name: Not on file  . Number of children: Not on file  . Years of education: Not on file  . Highest education level: Not on file  Social Needs  . Financial resource strain: Not on file  . Food insecurity - worry: Not on file  . Food insecurity -  inability: Not on file  . Transportation needs - medical: Not on file  . Transportation needs - non-medical: Not on file  Occupational History  . Not on file  Tobacco Use  . Smoking status: Former Smoker    Last attempt to quit: 02/17/1954    Years since quitting: 63.5  . Smokeless tobacco: Former Engineer, waterUser  Substance and Sexual Activity  . Alcohol use: No  . Drug use: No  . Sexual activity: Not on file  Other Topics Concern  . Not on file  Social History Narrative  . Not on file   Past Surgical History:  Procedure Laterality Date  . ADRENAL GLAND  SURGERY Left   . AORTA SURGERY    . CAROTID ARTERY - SUBCLAVIAN ARTERY BYPASS GRAFT Bilateral   . CATARACT EXTRACTION W/ INTRAOCULAR LENS  IMPLANT, BILATERAL    . CHOLECYSTECTOMY    . FEMUR FRACTURE SURGERY Right   . HIP FRACTURE SURGERY Bilateral   . PROSTATE SURGERY    . REPLACEMENT TOTAL KNEE Right    Family History  Problem Relation Age of Onset  . Heart attack Father   . Stroke Father   . Heart disease Father    Allergies  Allergen Reactions  . Codeine Anaphylaxis  . Morphine Anaphylaxis  . Gabapentin Other (See Comments)    Other reaction(s): Other (See Comments), Unknown Unsure of what the reaction was Unsure of what the reaction was  . Stadol [Butorphanol]     Other reaction(s): Unknown  . Tapentadol Nausea Only    ONSET 01/04/2013  . Lyrica [Pregabalin] Rash      Assessment & Plan:  Patient presents to review vascular studies.  The patient was last seen on August 09, 2017 for evaluation pain with and without activity to the bilateral thighs.  Patient notes he had a right hip replacement done earlier in the year and has not been able to walk without a walker since.  These symptoms are stable.  The patient underwent a bilateral ABI which was notable for right triphasic blood flow noted through the femoral, popliteal, posterior tibial, and anterior tibial arteries.  Left: Biphasic through the femoral artery transitioning to triphasic through the popliteal, posterior tibial, anterior tibial.  The patient underwent an aortoiliac arterial duplex exam which was notable for Doppler velocities suggesting less than 50% stenosis of the bilateral common iliac and external arteries.  No hemodynamically significant stenosis noted of the abdominal aorta arteries.  There is triphasic blood flow to the right iliac arteries.  There is triphasic blood flow, and the left iliac arteries with biphasic to the right external iliac arteries.  The patient denies any fever nausea vomiting.  1.  Bilateral carotid artery stenosis - Stable This is checked on a regular basis by our practice. The patient is asymptomatic today  2. PAD (peripheral artery disease) (HCC) - New Patient with very mild peripheral artery disease to the left lower extremity. I do not feel his symptoms are coming from arterial insufficiency. I feel the patient should touch base with his primary care physician to be assessed for osteoarthritis or degenerative joint disease as a contributing factor. The patient expresses understanding Since he does have risk factors for peripheral artery disease we can check with an ABI on yearly basis  3. Essential hypertension - Stable Encouraged good control as its slows the progression of atherosclerotic disease  4. Hyperlipidemia, unspecified hyperlipidemia type - Stable Encouraged good control as its slows the progression of atherosclerotic disease  Current  Outpatient Medications on File Prior to Visit  Medication Sig Dispense Refill  . atorvastatin (LIPITOR) 20 MG tablet Take 20 mg by mouth every evening.  3  . beta carotene w/minerals (OCUVITE) tablet Take 1 tablet by mouth every evening.    . diclofenac sodium (VOLTAREN) 1 % GEL Apply 1 application topically 4 (four) times daily.  11  . Fluticasone-Umeclidin-Vilant (TRELEGY ELLIPTA) 100-62.5-25 MCG/INH AEPB Inhale 1 puff into the lungs daily. 60 each 0  . furosemide (LASIX) 40 MG tablet Take 40 mg by mouth daily.  3  . losartan (COZAAR) 25 MG tablet Take 25 mg by mouth every evening.    . Lutein-Zeaxanthin (OCUVITE LUTEIN 25) 25-5 MG CAPS Take by mouth.    . metoprolol succinate (TOPROL-XL) 100 MG 24 hr tablet Take 100 mg by mouth every evening.    Marland Kitchen NOVOLOG MIX 70/30 FLEXPEN (70-30) 100 UNIT/ML FlexPen Inject 25 Units into the skin 2 (two) times daily.    . ONE TOUCH ULTRA TEST test strip 1 strip by Other route 3 (three) times daily.  3  . sertraline (ZOLOFT) 100 MG tablet Take 100 mg by mouth every evening.  3  .  tiotropium (SPIRIVA HANDIHALER) 18 MCG inhalation capsule Place 1 capsule (18 mcg total) into inhaler and inhale daily. 30 capsule 2  . umeclidinium-vilanterol (ANORO ELLIPTA) 62.5-25 MCG/INH AEPB Inhale 1 puff into the lungs daily. 60 each 0  . XARELTO 15 MG TABS tablet Take 15 mg by mouth every evening.  3  . HYDROcodone-acetaminophen (NORCO/VICODIN) 5-325 MG tablet Take 1-2 tablets by mouth every 4 (four) hours as needed for moderate pain. (Patient not taking: Reported on 08/19/2017) 30 tablet 0   No current facility-administered medications on file prior to visit.    There are no Patient Instructions on file for this visit. No Follow-up on file.  KIMBERLY A STEGMAYER, PA-C

## 2017-09-23 ENCOUNTER — Ambulatory Visit (INDEPENDENT_AMBULATORY_CARE_PROVIDER_SITE_OTHER): Payer: Medicare Other | Admitting: Vascular Surgery

## 2017-09-23 ENCOUNTER — Encounter (INDEPENDENT_AMBULATORY_CARE_PROVIDER_SITE_OTHER): Payer: Medicare Other

## 2017-09-29 ENCOUNTER — Other Ambulatory Visit: Payer: Self-pay | Admitting: Orthopedic Surgery

## 2017-09-29 DIAGNOSIS — M5432 Sciatica, left side: Secondary | ICD-10-CM

## 2017-10-06 ENCOUNTER — Ambulatory Visit
Admission: RE | Admit: 2017-10-06 | Discharge: 2017-10-06 | Disposition: A | Discharge status: Home / Self Care | Payer: Medicare Other | Source: Ambulatory Visit | Attending: Orthopedic Surgery | Admitting: Orthopedic Surgery

## 2017-10-06 DIAGNOSIS — M48061 Spinal stenosis, lumbar region without neurogenic claudication: Secondary | ICD-10-CM | POA: Insufficient documentation

## 2017-10-06 DIAGNOSIS — Z96641 Presence of right artificial hip joint: Secondary | ICD-10-CM | POA: Diagnosis present

## 2017-10-06 DIAGNOSIS — M5432 Sciatica, left side: Secondary | ICD-10-CM | POA: Diagnosis not present

## 2017-10-13 ENCOUNTER — Other Ambulatory Visit: Payer: Self-pay | Admitting: Orthopedic Surgery

## 2017-10-13 DIAGNOSIS — M48061 Spinal stenosis, lumbar region without neurogenic claudication: Secondary | ICD-10-CM

## 2017-11-01 ENCOUNTER — Ambulatory Visit
Admission: RE | Admit: 2017-11-01 | Discharge: 2017-11-01 | Disposition: A | Discharge status: Home / Self Care | Payer: Medicare Other | Source: Ambulatory Visit | Attending: Orthopedic Surgery | Admitting: Orthopedic Surgery

## 2017-11-01 DIAGNOSIS — M48061 Spinal stenosis, lumbar region without neurogenic claudication: Secondary | ICD-10-CM

## 2017-11-01 MED ORDER — METHYLPREDNISOLONE ACETATE 40 MG/ML INJ SUSP (RADIOLOG
120.0000 mg | Freq: Once | INTRAMUSCULAR | Status: AC
  Administered 2017-11-01: 120 mg via EPIDURAL

## 2017-11-01 MED ORDER — IOPAMIDOL (ISOVUE-M 200) INJECTION 41%
1.0000 mL | Freq: Once | INTRAMUSCULAR | Status: AC
  Administered 2017-11-01: 1 mL via EPIDURAL

## 2017-11-01 NOTE — Discharge Instructions (Signed)

## 2017-12-28 ENCOUNTER — Other Ambulatory Visit: Payer: Self-pay | Admitting: Neurosurgery

## 2018-01-10 NOTE — H&P (Signed)
Patient ID:   [id number] Patient: Gary Thomas  Date of Birth: [birth year] Visit Type: Office Visit   Date: 12/13/2017 08:30 AM Provider: Danae Orleans. Venetia Maxon MD   This 84 year old male presents for back pain.   History of Present Illness: 1.  back pain  Gary Thomas, 84 year old retired male, returns for evaluation.  He reports increasing lumbar pain and bilateral leg weakness since right THR in May.  He was last seen in 2015.  BC powders taken daily  Xarelto continues for history DVT/PE  History:  HTN, prostate cancer, IDDM, stroke (years ago), DVT, PE, chronic kidney disease/ has 1 kidney with 35% kidney function  Surgical history:  Cholecystectomy, prostate, right femur, carotid endarterectomy, appendectomy, right knee, right THR May 2018  December 2018 MRI on [place]  The patient says that he is incapacitated by back pain and bilateral lower extremity pain.   Lumbar imaging including MRI and radiographs were reviewed.  This demonstrates L3-4 stenosis and facet arthropathy with L4-5 spondylolisthesis and severe spinal stenosis with L5-S1 foraminal stenosis as well as central stenosis at this level.  Lumbar radiographs were reviewed which demonstrate at L4-5 anterolisthesis of L4 and L5 8.3 mm on neutral, 8.7 mm on extension, 7.4 mm on flexion.   The patient states that he has undergone physical therapy for several months without significant improvement.           PAST MEDICAL/SURGICAL HISTORY   (Reviewed, updated)  Disease/disorder Onset Date Management Date Comments  Cancer, prostate      Cataracts      Diabetes mellitus      Hypertension        knee surgery      Cholecystectomy    Arthritis      Stroke         PAST MEDICAL HISTORY, SURGICAL HISTORY, FAMILY HISTORY, SOCIAL HISTORY AND REVIEW OF SYSTEMS I have reviewed the patient's past medical, surgical, family and social history as well as the comprehensive review of systems as included on the [facility] history form dated 12/13/2017, which I have signed.  Family History: Reviewed, no changes.  Last detailed document date:09/18/2014.   Social History: Reviewed, no changes. Last detailed document date: 09/18/2014.    MEDICATIONS(added, continued or stopped this visit): Started Medication Directions Instruction Stopped   atorvastatin 20 mg tablet take 1 tablet by oral route  every day     furosemide 40 mg tablet take 1 tablet by oral route  every day     losartan 25 mg tablet take 1 tablet by oral route  every day     LOSARTAN POTASSIUM take 1 tablet by oral route  every day  12/13/2017   Novolog 100 unit/mL subcutaneous solution Inject twice daily     occuvite  ORAL 1 tablet daily  12/13/2017   sertraline 50 mg tablet take 1 tablet by oral route  every day     Toprol XL 100 mg tablet,extended release take 1 tablet by oral route  every day     Xarelto 15 mg tablet take 1 tablet by oral route  every day       ALLERGIES: Ingredient Reaction Medication Name Comment  BUTORPHANOL TARTRATE Shortness Of Breath Stadol   CODEINE Nausea; Vomiting    MORPHINE Trouble Breathing     Reviewed, updated.   Review of Systems System Neg/Pos Details  Constitutional Negative Chills, Fatigue, Fever, Malaise, Night sweats, Weight gain and Weight loss.  ENMT Negative Ear  drainage, Hearing loss, Nasal drainage, Otalgia, Sinus pressure and Sore throat.  Eyes Negative Eye discharge, Eye pain and Vision changes.  Respiratory Negative Chronic cough, Cough, Dyspnea, Known TB exposure and Wheezing.  Cardio Negative Chest pain, Claudication, Edema and Irregular heartbeat/palpitations.  GI Negative Abdominal pain, Blood in stool, Change in stool pattern, Constipation, Decreased appetite, Diarrhea, Heartburn, Nausea and Vomiting.  GU Negative Dribbling, Dysuria, Erectile dysfunction, Hematuria, Polyuria (Genitourinary), Slow stream, Urinary frequency, Urinary incontinence and  Urinary retention.  Endocrine Negative Cold intolerance, Heat intolerance, Polydipsia and Polyphagia.  Neuro Positive Extremity weakness, Gait disturbance.  Psych Negative Anxiety, Depression and Insomnia.  Integumentary Negative Brittle hair, Brittle nails, Change in shape/size of mole(s), Hair loss, Hirsutism, Hives, Pruritus, Rash and Skin lesion.  MS Positive Back pain.  Hema/Lymph Negative Easy bleeding, Easy bruising and Lymphadenopathy.  Allergic/Immuno Negative Contact allergy, Environmental allergies, Food allergies and Seasonal allergies.  Reproductive Negative Penile discharge and Sexual dysfunction.   Vitals Date Temp F BP Pulse Ht In Wt Lb BMI BSA Pain Score  12/13/2017  145/75 68 69 227 33.52  5/10     PHYSICAL EXAM General Level of Distress: no acute distress Overall Appearance: normal  Head and Face  Right Left  Fundoscopic Exam:  normal normal    Cardiovascular Cardiac: regular rate and rhythm without murmur  Right Left  Carotid Pulses: normal normal  Respiratory Lungs: clear to auscultation  Neurological Orientation: normal Recent and Remote Memory: normal Attention Span and Concentration:   normal Language: normal Fund of Knowledge: normal  Right Left Sensation: normal normal Upper Extremity Coordination: normal normal  Lower Extremity Coordination: normal normal  Musculoskeletal Gait and Station: normal  Right Left Upper Extremity Muscle Strength: normal normal Lower Extremity Muscle Strength: normal normal Upper Extremity Muscle Tone:  normal normal Lower Extremity Muscle Tone: normal normal   Motor Strength Upper and lower extremity motor strength was tested in the clinically pertinent muscles. Any abnormal findings will be noted below.   Right Left Tib Anterior:  4/5 EHL: 4/5 4-/5   Deep Tendon  Reflexes  Right Left Biceps: normal normal Triceps: normal normal Brachioradialis: normal normal Patellar: normal normal Achilles: normal normal  Sensory Sensation was tested at L1 to S1. Any abnormal findings will be noted below.  Right Left L5:  decreased   Cranial Nerves II. Optic Nerve/Visual Fields: normal III. Oculomotor: normal IV. Trochlear: normal V. Trigeminal: normal VI. Abducens: normal VII. Facial: normal VIII. Acoustic/Vestibular: normal IX. Glossopharyngeal: normal X. Vagus: normal XI. Spinal Accessory: normal XII. Hypoglossal: normal  Motor and other Tests Lhermittes: negative Rhomberg: negative Pronator drift: absent     Right Left Hoffman's: normal normal Clonus: normal normal Babinski: normal normal SLR: negative positive at 40 degrees Patrick's Pearlean Brownie): negative negative Toe Walk: normal normal Toe Lift: normal normal Heel Walk: normal normal SI Joint: nontender nontender   Additional Findings:  Left greater than right sciatic notch discomfort to palpation     IMPRESSION The patient has multiple medical comorbidities but does have significant lumbar stenosis and canal compromise which is quite symptomatic.  He has a mobile spondylolisthesis of L4 and L5 as well as significant stenosis at L3-4, L3-4 L4-5, L5-S1 levels.  We will obtain scoliosis radiographs.   Comments:  Xarelto will need to stop prior to surgery...  Awaiting PCP recommendations  Completed Orders (this encounter) Order Details Reason Side Interpretation Result Initial Treatment Date Region  Hypertension education Patient to follow up with primary care provider.  Dietary management education, guidance, and counseling patient encouraged to eat a well balanced diet        Scoliosis- AP/Lat      12/13/2017   Lumbar Spine- AP/Lat/Flex/Ex      12/13/2017    Assessment/Plan # Detail Type Description   1. Assessment Cervicalgia (M54.2).       2. Assessment Lumbar  stenosis with neurogenic claudication (M48.062).       3. Assessment Lumbar radiculopathy (M54.16).       4. Assessment Spondylolisthesis, lumbar region (M43.16).       5. Assessment Essential (primary) hypertension (I10).       6. Assessment Body mass index (BMI) 33.0-33.9, adult (Z61.09(Z68.33).   Plan Orders Today's instructions / counseling include(s) Dietary management education, guidance, and counseling.           Pain Management Plan Pain Scale: 5/10. Method: Numeric Pain Intensity Scale. Location: back. Onset: 02/12/2017. Duration: varies. Quality: discomforting. Pain management follow-up plan of care: Patient is taking OTC pain relievers for relief..  The patient is anxious to get relief from his pain and wants to have surgery.  He has multiple medical comorbidities and I told him that we could go ahead with surgery provided he received medical clearance from his primary physician as well as his kidney specialist.  He would also need to be able to come off the Xarelto for surgery.  Assuming that clearance is given, I would recommend L3-4, L4-5, L5-S1 decompression and fusion surgery.   Orders: Diagnostic Procedures: Assessment Procedure  M54.16 Lumbar Spine- AP/Lat  M54.2 Lumbar Spine- AP/Lat/Flex/Ex  M54.2 Scoliosis- AP/Lat  Instruction(s)/Education: Assessment Instruction  I10 Hypertension education  [phone] Dietary management education, guidance, and counseling             Provider:  Venetia MaxonStern MDDanae Orleans, Steven Basso D 12/17/2017 5:32 PM  Dictation edited by: Danae OrleansJoseph D. Venetia MaxonStern    CC Providers: Osborne OmanJeffery  [facility] [address] [place],  [place]  [phone]-   Sharlet SalinaBenjamin Chasnis  [address] [place], [place] [phone]-              Electronically signed by Danae OrleansJoseph D. Venetia MaxonStern MD on 12/17/2017 05:32 PM

## 2018-01-19 ENCOUNTER — Encounter (HOSPITAL_COMMUNITY)
Admission: RE | Admit: 2018-01-19 | Discharge: 2018-01-19 | Disposition: A | Discharge status: Home / Self Care | Payer: Medicare Other | Source: Ambulatory Visit | Attending: Neurosurgery | Admitting: Neurosurgery

## 2018-01-19 DIAGNOSIS — Z01812 Encounter for preprocedural laboratory examination: Secondary | ICD-10-CM | POA: Diagnosis present

## 2018-01-19 LAB — GLUCOSE, CAPILLARY: GLUCOSE-CAPILLARY: 100 mg/dL — AB (ref 65–99)

## 2018-01-29 NOTE — H&P (Signed)
Patient ID:   [id number] Patient: Gary Thomas  Date of Birth: 16-Jun-1932 Visit Type: Office Visit   Date: 12/13/2017 08:30 AM Provider: Danae Orleans. Venetia Maxon MD   This 84 year old male presents for back pain.   History of Present Illness: 1.  back pain  Whitfield Guidotti, 84 year old retired male, returns for evaluation.  He reports increasing lumbar pain and bilateral leg weakness since right THR in May.  He was last seen in 2015.  BC powders taken daily  Xarelto continues for history DVT/PE  History:  HTN, prostate cancer, IDDM, stroke (years ago), DVT, PE, chronic kidney disease/ has 1 kidney with 35% kidney function  Surgical history:  Cholecystectomy, prostate, right femur, carotid endarterectomy, appendectomy, right knee, right THR May 2018  December 2018 MRI on [place]  The patient says that he is incapacitated by back pain and bilateral lower extremity pain.   Lumbar imaging including MRI and radiographs were reviewed.  This demonstrates L3-4 stenosis and facet arthropathy with L4-5 spondylolisthesis and severe spinal stenosis with L5-S1 foraminal stenosis as well as central stenosis at this level.  Lumbar radiographs were reviewed which demonstrate at L4-5 anterolisthesis of L4 and L5 8.3 mm on neutral, 8.7 mm on extension, 7.4 mm on flexion.   The patient states that he has undergone physical therapy for several months without significant improvement.           PAST MEDICAL/SURGICAL HISTORY   (Reviewed, updated)  Disease/disorder Onset Date Management Date Comments  Cancer, prostate      Cataracts      Diabetes mellitus      Hypertension        knee surgery      Cholecystectomy    Arthritis      Stroke         PAST MEDICAL HISTORY, SURGICAL HISTORY, FAMILY HISTORY, SOCIAL HISTORY AND REVIEW OF SYSTEMS I have reviewed the patient's past medical, surgical, family and social history as well as the comprehensive review of systems as included on the [facility] history form dated 12/13/2017, which I have signed.  Family History: Reviewed, no changes.  Last detailed document date:09/18/2014.   Social History: Reviewed, no changes. Last detailed document date: 09/18/2014.    MEDICATIONS(added, continued or stopped this visit): Started Medication Directions Instruction Stopped   atorvastatin 20 mg tablet take 1 tablet by oral route  every day     furosemide 40 mg tablet take 1 tablet by oral route  every day     losartan 25 mg tablet take 1 tablet by oral route  every day     LOSARTAN POTASSIUM take 1 tablet by oral route  every day  12/13/2017   Novolog 100 unit/mL subcutaneous solution Inject twice daily     occuvite  ORAL 1 tablet daily  12/13/2017   sertraline 50 mg tablet take 1 tablet by oral route  every day     Toprol XL 100 mg tablet,extended release take 1 tablet by oral route  every day     Xarelto 15 mg tablet take 1 tablet by oral route  every day       ALLERGIES: Ingredient Reaction Medication Name Comment  BUTORPHANOL TARTRATE Shortness Of Breath Stadol   CODEINE Nausea; Vomiting    MORPHINE Trouble Breathing     Reviewed, updated.   Review of Systems System Neg/Pos Details  Constitutional Negative Chills, Fatigue, Fever, Malaise, Night sweats, Weight gain and Weight loss.  ENMT Negative Ear  drainage, Hearing loss, Nasal drainage, Otalgia, Sinus pressure and Sore throat.  Eyes Negative Eye discharge, Eye pain and Vision changes.  Respiratory Negative Chronic cough, Cough, Dyspnea, Known TB exposure and Wheezing.  Cardio Negative Chest pain, Claudication, Edema and Irregular heartbeat/palpitations.  GI Negative Abdominal pain, Blood in stool, Change in stool pattern, Constipation, Decreased appetite, Diarrhea, Heartburn, Nausea and Vomiting.  GU Negative Dribbling, Dysuria, Erectile dysfunction, Hematuria, Polyuria (Genitourinary), Slow stream, Urinary frequency, Urinary incontinence and  Urinary retention.  Endocrine Negative Cold intolerance, Heat intolerance, Polydipsia and Polyphagia.  Neuro Positive Extremity weakness, Gait disturbance.  Psych Negative Anxiety, Depression and Insomnia.  Integumentary Negative Brittle hair, Brittle nails, Change in shape/size of mole(s), Hair loss, Hirsutism, Hives, Pruritus, Rash and Skin lesion.  MS Positive Back pain.  Hema/Lymph Negative Easy bleeding, Easy bruising and Lymphadenopathy.  Allergic/Immuno Negative Contact allergy, Environmental allergies, Food allergies and Seasonal allergies.  Reproductive Negative Penile discharge and Sexual dysfunction.   Vitals Date Temp F BP Pulse Ht In Wt Lb BMI BSA Pain Score  12/13/2017  145/75 68 69 227 33.52  5/10     PHYSICAL EXAM General Level of Distress: no acute distress Overall Appearance: normal  Head and Face  Right Left  Fundoscopic Exam:  normal normal    Cardiovascular Cardiac: regular rate and rhythm without murmur  Right Left  Carotid Pulses: normal normal  Respiratory Lungs: clear to auscultation  Neurological Orientation: normal Recent and Remote Memory: normal Attention Span and Concentration:   normal Language: normal Fund of Knowledge: normal  Right Left Sensation: normal normal Upper Extremity Coordination: normal normal  Lower Extremity Coordination: normal normal  Musculoskeletal Gait and Station: normal  Right Left Upper Extremity Muscle Strength: normal normal Lower Extremity Muscle Strength: normal normal Upper Extremity Muscle Tone:  normal normal Lower Extremity Muscle Tone: normal normal   Motor Strength Upper and lower extremity motor strength was tested in the clinically pertinent muscles. Any abnormal findings will be noted below.   Right Left Tib Anterior:  4/5 EHL: 4/5 4-/5   Deep Tendon  Reflexes  Right Left Biceps: normal normal Triceps: normal normal Brachioradialis: normal normal Patellar: normal normal Achilles: normal normal  Sensory Sensation was tested at L1 to S1. Any abnormal findings will be noted below.  Right Left L5:  decreased   Cranial Nerves II. Optic Nerve/Visual Fields: normal III. Oculomotor: normal IV. Trochlear: normal V. Trigeminal: normal VI. Abducens: normal VII. Facial: normal VIII. Acoustic/Vestibular: normal IX. Glossopharyngeal: normal X. Vagus: normal XI. Spinal Accessory: normal XII. Hypoglossal: normal  Motor and other Tests Lhermittes: negative Rhomberg: negative Pronator drift: absent     Right Left Hoffman's: normal normal Clonus: normal normal Babinski: normal normal SLR: negative positive at 40 degrees Patrick's Pearlean Brownie): negative negative Toe Walk: normal normal Toe Lift: normal normal Heel Walk: normal normal SI Joint: nontender nontender   Additional Findings:  Left greater than right sciatic notch discomfort to palpation     IMPRESSION The patient has multiple medical comorbidities but does have significant lumbar stenosis and canal compromise which is quite symptomatic.  He has a mobile spondylolisthesis of L4 and L5 as well as significant stenosis at L3-4, L3-4 L4-5, L5-S1 levels.  We will obtain scoliosis radiographs.   Comments:  Xarelto will need to stop prior to surgery...  Awaiting PCP recommendations  Completed Orders (this encounter) Order Details Reason Side Interpretation Result Initial Treatment Date Region  Hypertension education Patient to follow up with primary care provider.  Dietary management education, guidance, and counseling patient encouraged to eat a well balanced diet        Scoliosis- AP/Lat      12/13/2017   Lumbar Spine- AP/Lat/Flex/Ex      12/13/2017    Assessment/Plan # Detail Type Description   1. Assessment Cervicalgia (M54.2).       2. Assessment Lumbar  stenosis with neurogenic claudication (M48.062).       3. Assessment Lumbar radiculopathy (M54.16).       4. Assessment Spondylolisthesis, lumbar region (M43.16).       5. Assessment Essential (primary) hypertension (I10).       6. Assessment Body mass index (BMI) 33.0-33.9, adult (Z61.09).   Plan Orders Today's instructions / counseling include(s) Dietary management education, guidance, and counseling.           Pain Management Plan Pain Scale: 5/10. Method: Numeric Pain Intensity Scale. Location: back. Onset: 02/12/2017. Duration: varies. Quality: discomforting. Pain management follow-up plan of care: Patient is taking OTC pain relievers for relief..  The patient is anxious to get relief from his pain and wants to have surgery.  He has multiple medical comorbidities and I told him that we could go ahead with surgery provided he received medical clearance from his primary physician as well as his kidney specialist.  He would also need to be able to come off the Xarelto for surgery.  Assuming that clearance is given, I would recommend L3-4, L4-5, L5-S1 decompression and fusion surgery.   Orders: Diagnostic Procedures: Assessment Procedure  M54.16 Lumbar Spine- AP/Lat  M54.2 Lumbar Spine- AP/Lat/Flex/Ex  M54.2 Scoliosis- AP/Lat  Instruction(s)/Education: Assessment Instruction  I10 Hypertension education  [phone] Dietary management education, guidance, and counseling             Provider:  Venetia Maxon MDDanae Orleans 12/17/2017 5:32 PM  Dictation edited by: Danae Orleans. Venetia Maxon    CC Providers: [facility] [address] [place],  [place]  [address]-   Sharlet Salina Chasnis  [address] [place], [place] [address]-              Electronically signed by Danae Orleans. Venetia Maxon MD on 12/17/2017 05:32 PM   [address] [place], [place] [address] Phone: [phone]   Patient ID:   [id number] Patient: Ruthvik Barnaby  Date of  Birth: 1932/09/17 Visit Type: Office Visit   Date: 09/18/2014 11:30 AM Provider: Tia Alert MD   This 84 year old male presents for neck pain.  History of Present Illness: 1.  neck pain  84 year old white male seen in neurosurgical consultation at the kind request of Dr. Levi Aland regarding a long history of chronic neck pain which radiates into the shoulders.  He is on xarelto for a DVT/PE after surgery 10 years ago.  He has had injections that are not helping.  He has had pain for 3-4 months.  He states his "hands go numb when Drive."  His hands occasionally go numb at night.  His symptoms are worse at night, especially when rotating in bed.  It is a "pulling pain" he has taken Percocet 10 which helps but causes nausea and constipation.  He is status post bilateral carotid endarterectomy twice and has 36% renal function and one kidney.  He denies radicular pain.  He denies weakness or change in gait.        PAST MEDICAL/SURGICAL HISTORY   (Detailed)  Disease/disorder Onset Date Management Date Comments    Cholecystectomy  Cancer, prostate      Diabetes mellitus      Hypertension          PAST MEDICAL HISTORY, SURGICAL HISTORY, FAMILY HISTORY, SOCIAL HISTORY AND REVIEW OF SYSTEMS I have reviewed the patient's past medical, surgical, family and social history as well as the comprehensive review of systems as included on the [facility] history form dated 09/18/2014, which I have signed.  Family History  (Detailed)  Relationship Family Member Name Deceased Age at Death Condition Onset Age Cause of Death      Family history of Hypertension  N   SOCIAL HISTORY  (Detailed) Tobacco use reviewed. Preferred language is Unknown.   Smoking status: Never smoker.  SMOKING STATUS Use Status Type Smoking Status Usage Per Day Years Used Total Pack Years  no/never  Never smoker       HOME ENVIRONMENT/SAFETY The patient has not fallen in the last year.         MEDICATIONS(added, continued or stopped this visit):   Started Medication Directions Instruction Stopped   atorvastatin 20 mg tablet take 1 tablet by oral route  every day     furosemide 40 mg tablet take 1 tablet by oral route  every day     losartan take 1 tablet by oral route  every day     Novolog 100 unit/mL subcutaneous solution Inject twice daily     occuvite  ORAL 1 tablet daily     sertraline 50 mg tablet take 1 tablet by oral route  every day     Toprol XL 100 mg tablet,extended release take 1 tablet by oral route  every day     Xarelto 15 mg tablet take 1 tablet by oral route  every day      ALLERGIES:  Ingredient Reaction Medication Name Comment  MORPHINE Trouble Breathing    Reviewed, updated.   REVIEW OF SYSTEMS System Neg/Pos Details  Constitutional Negative Chills, fatigue, fever, malaise, night sweats, weight gain and weight loss.  ENMT Positive Wears glasses, balance disturbance.  ENMT Negative Ear drainage, hearing loss, nasal drainage, otalgia, sinus pressure and sore throat.  Eyes Negative Eye discharge, eye pain and vision changes.  Respiratory Negative Chronic cough, cough, dyspnea, known TB exposure and wheezing.  Cardio Positive Edema.  Cardio Negative Chest pain, claudication and irregular heartbeat/palpitations.  GI Negative Abdominal pain, blood in stool, change in stool pattern, constipation, decreased appetite, diarrhea, heartburn, nausea and vomiting.  GU Negative Dribbling, dysuria, erectile dysfunction, hematuria, polyuria, slow stream, urinary frequency, urinary incontinence and urinary retention.  Endocrine Negative Cold intolerance, heat intolerance, polydipsia and polyphagia.  Neuro Negative Dizziness, extremity weakness, gait disturbance, headache, memory impairment, numbness in extremity, seizures and tremors.  Psych Negative Anxiety, depression and insomnia.  Integumentary Negative Brittle hair, brittle nails, change in  shape/size of mole(s), hair loss, hirsutism, hives, pruritus, rash and skin lesion.  MS Positive Neck pain.  MS Negative Back pain, joint pain, joint swelling and muscle weakness.  Hema/Lymph Negative Easy bleeding, easy bruising and lymphadenopathy.  Allergic/Immuno Negative Contact allergy, environmental allergies, food allergies and seasonal allergies.  Reproductive Negative Penile discharge and sexual dysfunction.    Vitals Date Temp F BP Pulse Ht In Wt Lb BMI BSA Pain Score  09/18/2014  163/77 70 69 244 36.03  3/10     PHYSICAL EXAM General Level of Distress: no acute distress Overall Appearance: Normal    Cardiovascular Cardiac: regular rate and rhythm  Respiratory Lungs: non-labored  Neurological Orientation: normal Recent and Remote Memory: normal Attention Span and Concentration:   normal Language: normal Fund of Knowledge: normal  Right Left Sensation: normal normal Upper Extremity Coordination: normal normal  Lower Extremity Coordination: normal normal  Musculoskeletal Gait and Station: normal  Right Left Upper Extremity Muscle Strength: normal normal Lower Extremity Muscle Strength: normal normal Upper Extremity Muscle Tone:  normal normal Lower Extremity Muscle Tone: normal normal  Motor Strength Upper and lower extremity motor strength was tested in the clinically pertinent muscles.     Deep Tendon Reflexes  Right Left Biceps: normal normal Triceps: normal normal Brachiloradialis: normal normal Patellar: normal normal Achilles: normal normal  Cranial Nerves II. Optic Nerve/Visual Fields: normal III. Oculomotor: normal IV. Trochlear: normal V. Trigeminal: normal VI. Abducens: normal VII. Facial: normal VIII. Acoustic/Vestibular: normal IX. Glossopharyngeal: normal X. Vagus: normal XI. Spinal Accessory: normal XII. Hypoglossal: normal  Motor and other Tests Lhermittes: negative Pronator  drift: absent     Right Left Hoffman's: absent absent SLR: negative negative Toe Walk: normal normal Heel Walk: normal normal   Additional Findings:  Well-healed scars over the carotids bilaterally  No long tract signs    DIAGNOSTIC RESULTS MRI of the cervical spine was reviewed as well as the report.  He has mid cervical spondylosis at C4 C5 and C5 C6 with bilateral foraminal stenosis.  No overt cord compression or signal change in the cord.  He has milder spondylosis at other levels.  He has a minimal anterolisthesis at C7-T1.    IMPRESSION Cervical spondylosis with axial neck pain without significant radicular features.  The numbness in his hand sounds more like carpal tunnel than radicular.  He is on anticoagulants for history of DVT and PE.  He has chronic renal insufficiency and one functioning kidney.  He is status post bilateral carotid endarterectomies with redo surgery on both sides.  Given all of this I think the risk of surgery outweighs the potential benefits.  I would Not recommend surgery to him at this time.  I have gone over the films with him.  I have explained my reasoning.  He would have to be off his anticoagulants for at least 3 weeks for the series one week before in 2 weeks after) in my opinion.  Completed Orders (this encounter) Order Details Reason Side Interpretation Result Initial Treatment Date Region  Lifestyle education regarding diet Encouraged to eat a well balanced diet and follow up with primary care physician.        Hypertension education Follow up with primary care physician.         Assessment/Plan # Detail Type Description   1. Assessment Cervicalgia (M54.2).       2. Assessment Body mass index (BMI) 36.0-36.9, adult (Z68.36).   Plan Orders Today's instructions / counseling include(s) Lifestyle education regarding diet.       3. Assessment Essential (primary) hypertension (I10).         Pain Assessment/Treatment Pain Scale:  3/10. Method: Numeric Pain Intensity Scale. Location: neck. Onset: 03/19/2014. Duration: varies. Quality: discomforting. Pain Assessment/Treatment follow-up plan of care: Patient is taking medications as prescribed..  Fall Risk Plan The patient has not fallen in the last year.  Follow-up prn. Questions encouraged and answered.  Orders: Diagnostic Procedures: Assessment Procedure  M54.2 Return to Clinic  Instruction(s)/Education: Assessment Instruction  I10 Hypertension education  Z68.36 Lifestyle education regarding diet             Provider:  Tia Alert MD  09/19/2014 02:25 PM Dictation edited by: Tia Alertavid S. Jones    CC Providers: Merri RayBenjamin Chasnis [address] [place], [place] [id number]-         ----------------------------------------------------------------------------------------------------------------------------------------------------------------------         Electronically signed by Tia Alertavid S. Jones MD on 09/19/2014 02:25 PM

## 2018-02-01 NOTE — Progress Notes (Addendum)
Anesthesia Chart Review:   Case:  [id number] Date/Time:  02/15/18 1145   Procedure:  L3-4 L4-5 L5-S1 Posterior lumbar interbody fusion (N/A Back) - L3-4 L4-5 L5-S1 Posterior lumbar interbody fusion   Anesthesia type:  General   Pre-op diagnosis:  Lumbar stenosis with neurogenic claudication   Location:  [facility] ROOM 19 / [facility]   Surgeon:  Maeola HarmanStern, Joseph, MD     DISCUSSION: Patient is an 84 year old male scheduled for the above procedure.   History includes former smoker (quit '53), PE (remote; on Xarelto), DM2, HTN, CVA (right sided weakness), mild AS (07/2016 echo), emphysema, carotid surgery ("bilateral CEA with graft" or carotid subclavian bypass, "aortic patch" 10+ years; [place]. [place]uffy [place]ley, [place] renal cysts with known marked hydronephrosis left kidney, pheochromocytoma (s/p left adrenalectomy), OSA (s/p UPPP; uses CPAP with O2) with secondary polycythemia, prostate cancer (s/p surgery; has urostomy). He denied history of afib.   Surgeon faxed medical clearance note from PCP  Dr. Aram BeechamJeffrey Sparks and nephrology clearance from Dr. Shade FloodHarmeet Signh with the following recommendation: " No contraindication to proceed from renal standpoint.  Hold losartan the day before surgery to avoid hypotension." (This information was given to his PAT RN.) [facility] showed H/H 10.6/35.6, BUN 29, Cr 1.87. Nikki from Dr. Fredrich BirksStern's office called to report she had received clearance from Dr. Judithann SheenSparks for patient to hold Xarelto for 5 days prior to surgery and would give patient these instructions. He will get coags on the day of surgery.   If no acute changes then I anticipate that he can proceed as planned.    VS: BP (!) 137/50   Pulse 76   Temp 36.5 C   Resp 20   Ht 5\' 9"  (1.753 m)   Wt 218 lb (98.9 kg)   SpO2 95%   BMI 32.19 kg/m   PROVIDERS: - PCP is Sparks, Duane LopeJeffrey D, MD ([facility]) - Nephrologist is Shade FloodSignh, Harmeet, MD - Endocrinologist is Carlena SaxSolum, Anna MD [facility]) - Vascular surgeon is Levora DredgeSchnier, Gregory, MD with [facility]. Last visit 08/09/17. - Pulmonologist is Shane Crutchamachandran, Pradeep, MD. Last visit 05/17/17.  LABS: Preoperative labs noted. BUN 35, Cr 1.72. WBC 10.6. H/H 10.4/36.4. PLT 211.  A1c 5.2. T&S done. He is for PT/INR on the day of surgery. Labs appear consistent with compared to results from his nephrologist. (all labs ordered are listed, but only abnormal results are displayed)  Labs Reviewed  CBC - Abnormal; Notable for the following components:      Result Value   WBC 10.6 (*)    Hemoglobin 10.4 (*)    HCT 36.4 (*)    MCH 22.9 (*)    MCHC 28.6 (*)    RDW 17.0 (*)    All other components within normal limits  BASIC METABOLIC PANEL - Abnormal; Notable for the following components:   Glucose, Bld 114 (*)    BUN 35 (*)    Creatinine, Ser 1.72 (*)    GFR calc non Af Amer 35 (*)    GFR calc Af Amer 40 (*)    All other components within normal limits  GLUCOSE, CAPILLARY - Abnormal; Notable for the following components:   Glucose-Capillary 122 (*)    All other components within normal limits  SURGICAL PCR SCREEN  HEMOGLOBIN A1C  TYPE AND SCREEN  ABO/RH    IMAGES: CXR 05/17/17: IMPRESSION: Areas of postoperative change. No edema or consolidation. Stable cardiac silhouette.  There is aortic atherosclerosis. Aortic Atherosclerosis (ICD10-I70.0).  OTHER:   PFT 05/11/17; ratio equal 61%, FEV1 equals 65%, TLC was 88%, DLCO = 73%. Overall this test is consistent with moderate emphysema with evidence of reversibility.  Desat walk on 04/07/17; at rest on RA on Ra is 94% and HR 72. After walking 180 feet his sat dropped to 82% and HR 79. Moderate dyspnea.   EKG: 02/07/18: SR with PACs.   CV: Echo 08/06/16 ([facility]): INTERPRETATION NORMAL LEFT VENTRICULAR SYSTOLIC FUNCTION NORMAL RIGHT VENTRICULAR SYSTOLIC FUNCTION MILD VALVULAR REGURGITATION (See below) MILD VALVULAR STENOSIS (See  below) Mild calcific AS, peak velocity 2.2 m/s  DOPPLER ECHO and OTHER SPECIAL PROCEDURES Aortic:  TRIVIAL AR  MILD AS   222.6 cm/sec peak vel   19.8 mmHg peak grad   9.7 mmHg mean grad  2.3 cm^2 by DOPPLER Mitral:  TRIVIAL MR  No MS   MV Inflow E Vel=85.3 cm/sec  MV Annulus E'Vel=6.3 cm/sec   E/E'Ratio=13.5 Tricuspid: MILD TR   No TS   292.1 cm/sec peak TR vel 39.1 mmHg peak RV pressure Pulmonary: TRIVIAL PR  No PS  Carotid U/S 07/05/17: IMPRESSION: 1.  40 to 59% stenosis of the middle right internal carotid artery graft. 2.  1 to 39% stenosis of the left internal carotid artery graft. 3.  Known occlusion of bilateral external carotid arteries. 4.  Antegrade flow of the left vertebral arteries noted.   Past Medical History:  Diagnosis Date  . Depression   . Diabetes mellitus without complication (HCC)   . Dyspnea   . Elevated lipids   . GERD (gastroesophageal reflux disease)   . Hypertension   . Pulmonary emboli (HCC)   . Renal insufficiency    only has 1 kidney  . Stroke (HCC)    Rt side weakness    Past Surgical History:  Procedure Laterality Date  . ADRENAL GLAND SURGERY Left   . AORTA SURGERY    . CAROTID ARTERY - SUBCLAVIAN ARTERY BYPASS GRAFT Bilateral   . CATARACT EXTRACTION W/ INTRAOCULAR LENS  IMPLANT, BILATERAL    . CHOLECYSTECTOMY    . EYE SURGERY    . FEMUR FRACTURE SURGERY Right   . HIP FRACTURE SURGERY Bilateral   . JOINT REPLACEMENT    . PROSTATE SURGERY    . REPLACEMENT TOTAL KNEE Right   . TOTAL HIP ARTHROPLASTY Right 03/02/2017   Procedure: TOTAL HIP ARTHROPLASTY ANTERIOR APPROACH;  Surgeon: Kennedy Bucker, MD;  Location: [facility] ORS;  Service: Orthopedics;  Laterality: Right;  Marland Kitchen VASCULAR SURGERY      MEDICATIONS: . atorvastatin (LIPITOR) 20 MG tablet  . beta carotene w/minerals (OCUVITE)  tablet  . diclofenac sodium (VOLTAREN) 1 % GEL  . furosemide (LASIX) 40 MG tablet  . losartan (COZAAR) 25 MG tablet  . metoprolol succinate (TOPROL-XL) 100 MG 24 hr tablet  . NOVOLOG MIX 70/30 FLEXPEN (70-30) 100 UNIT/ML FlexPen  . sertraline (ZOLOFT) 100 MG tablet  . tiotropium (SPIRIVA HANDIHALER) 18 MCG inhalation capsule  . XARELTO 15 MG TABS tablet   No current facility-administered medications for this encounter.    Velna Ochs [facility] Short Stay Center/Anesthesiology Phone [phone] 02/08/2018 4:40 PM

## 2018-02-04 NOTE — Pre-Procedure Instructions (Signed)
[facility]  02/04/2018      [id number], [place] - [address] [name] [address] Phone: [phone] Fax: [phone]    Your procedure is scheduled on May 7  Report to [facility] Admitting at 1000 A.M.  Call this number if you have problems the morning of surgery:  [phone]   Remember:  Do not eat food or drink liquids after midnight.  Continue all medications as directed by your physician except follow these medication instructions before surgery below   Take these medicines the morning of surgery with A SIP OF WATER  metoprolol succinate (TOPROL-XL) make sure you take the night before surgery if that is when you normally take  7 days prior to surgery STOP taking any Aspirin(unless otherwise instructed by your surgeon), Aleve, Naproxen, Ibuprofen, Motrin, Advil, Goody's, BC's, all herbal medications, fish oil, and all vitamins  FOLLOW Physicians instructions about Xarelto   WHAT DO I DO ABOUT MY DIABETES MEDICATION?   Marland Kitchen Do not take oral diabetes medicines (pills) the morning of surgery.  . THE NIGHT BEFORE SURGERY, take ____17_______ units of __NOVOLOG MIX 70/30 FLEXPEN (70-30)_________insulin.       . THE MORNING OF SURGERY, take ______0_______ units of _NOVOLOG MIX 70/30 FLEXPEN (70-30)_________insulin.  . The day of surgery, do not take other diabetes injectables, including Byetta (exenatide), Bydureon (exenatide ER), Victoza (liraglutide), or Trulicity (dulaglutide).  . If your CBG is greater than 220 mg/dL, you may take  of your sliding scale (correction) dose of insulin.   How to Manage Your Diabetes Before and After Surgery  Why is it important to control my blood sugar before and after surgery? . Improving blood sugar levels before and after surgery helps healing and can limit problems. . A way of improving blood sugar control is eating a healthy diet by: o  Eating less sugar and  carbohydrates o  Increasing activity/exercise o  Talking with your doctor about reaching your blood sugar goals . High blood sugars (greater than 180 mg/dL) can raise your risk of infections and slow your recovery, so you will need to focus on controlling your diabetes during the weeks before surgery. . Make sure that the doctor who takes care of your diabetes knows about your planned surgery including the date and location.  How do I manage my blood sugar before surgery? . Check your blood sugar at least 4 times a day, starting 2 days before surgery, to make sure that the level is not too high or low. o Check your blood sugar the morning of your surgery when you wake up and every 2 hours until you get to the [facility] unit. . If your blood sugar is less than 70 mg/dL, you will need to treat for low blood sugar: o Do not take insulin. o Treat a low blood sugar (less than 70 mg/dL) with  cup of clear juice (cranberry or apple), 4 glucose tablets, OR glucose gel. o Recheck blood sugar in 15 minutes after treatment (to make sure it is greater than 70 mg/dL). If your blood sugar is not greater than 70 mg/dL on recheck, call [phone] for further instructions. . Report your blood sugar to the [facility] nurse when you get to [facility].  . If you are admitted to the hospital after surgery: o Your blood sugar will be checked by the staff and you will probably be given insulin after surgery (instead of  oral diabetes medicines) to make sure you have good blood sugar levels. o The goal for blood sugar control after surgery is 80-180 mg/dL.    Do not wear jewelry  Do not wear lotions, powders, or cologne, or deodorant.  Men may shave face and neck.  Do not bring valuables to the hospital.  [facility] any belongings or valuables.  Contacts, dentures or bridgework may not be worn into surgery.  Leave your suitcase in the car.  After surgery it may be brought to your  room.  For patients admitted to the hospital, discharge time will be determined by your treatment team.  Patients discharged the day of surgery will not be allowed to drive home.   Special instructions:   [facility]- Preparing For Surgery  Before surgery, you can play an important role. Because skin is not sterile, your skin needs to be as free of germs as possible. You can reduce the number of germs on your skin by washing with CHG (chlorahexidine gluconate) Soap before surgery.  CHG is an antiseptic cleaner which kills germs and bonds with the skin to continue killing germs even after washing.  Please do not use if you have an allergy to CHG or antibacterial soaps. If your skin becomes reddened/irritated stop using the CHG.  Do not shave (including legs and underarms) for at least 48 hours prior to first CHG shower. It is OK to shave your face.  Please follow these instructions carefully.   1. Shower the NIGHT BEFORE SURGERY and the MORNING OF SURGERY with CHG.   2. If you chose to wash your hair, wash your hair first as usual with your normal shampoo.  3. After you shampoo, rinse your hair and body thoroughly to remove the shampoo.  4. Use CHG as you would any other liquid soap. You can apply CHG directly to the skin and wash gently with a scrungie or a clean washcloth.   5. Apply the CHG Soap to your body ONLY FROM THE NECK DOWN.  Do not use on open wounds or open sores. Avoid contact with your eyes, ears, mouth and genitals (private parts). Wash Face and genitals (private parts)  with your normal soap.  6. Wash thoroughly, paying special attention to the area where your surgery will be performed.  7. Thoroughly rinse your body with warm water from the neck down.  8. DO NOT shower/wash with your normal soap after using and rinsing off the CHG Soap.  9. Pat yourself dry with a CLEAN TOWEL.  10. Wear CLEAN PAJAMAS to bed the night before surgery, wear comfortable clothes the  morning of surgery  11. Place CLEAN SHEETS on your bed the night of your first shower and DO NOT SLEEP WITH PETS.    Day of Surgery: Do not apply any deodorants/lotions. Please wear clean clothes to the hospital/surgery [facility].      Please read over the following fact sheets that you were given.

## 2018-02-07 ENCOUNTER — Encounter (HOSPITAL_COMMUNITY)
Admission: RE | Admit: 2018-02-07 | Discharge: 2018-02-07 | Disposition: A | Discharge status: Home / Self Care | Payer: Medicare Other | Source: Ambulatory Visit | Attending: Neurosurgery | Admitting: Neurosurgery

## 2018-02-07 ENCOUNTER — Encounter (HOSPITAL_COMMUNITY): Payer: Self-pay

## 2018-02-07 ENCOUNTER — Other Ambulatory Visit: Payer: Self-pay

## 2018-02-07 DIAGNOSIS — I10 Essential (primary) hypertension: Secondary | ICD-10-CM | POA: Diagnosis not present

## 2018-02-07 DIAGNOSIS — Z8673 Personal history of transient ischemic attack (TIA), and cerebral infarction without residual deficits: Secondary | ICD-10-CM | POA: Insufficient documentation

## 2018-02-07 DIAGNOSIS — E119 Type 2 diabetes mellitus without complications: Secondary | ICD-10-CM | POA: Diagnosis not present

## 2018-02-07 DIAGNOSIS — Z01812 Encounter for preprocedural laboratory examination: Secondary | ICD-10-CM | POA: Insufficient documentation

## 2018-02-07 DIAGNOSIS — J439 Emphysema, unspecified: Secondary | ICD-10-CM | POA: Insufficient documentation

## 2018-02-07 DIAGNOSIS — M48062 Spinal stenosis, lumbar region with neurogenic claudication: Secondary | ICD-10-CM | POA: Insufficient documentation

## 2018-02-07 DIAGNOSIS — Z8546 Personal history of malignant neoplasm of prostate: Secondary | ICD-10-CM | POA: Insufficient documentation

## 2018-02-07 DIAGNOSIS — Z9889 Other specified postprocedural states: Secondary | ICD-10-CM | POA: Diagnosis not present

## 2018-02-07 DIAGNOSIS — Z01810 Encounter for preprocedural cardiovascular examination: Secondary | ICD-10-CM | POA: Insufficient documentation

## 2018-02-07 HISTORY — DX: Sleep apnea, unspecified: G47.30

## 2018-02-07 LAB — BASIC METABOLIC PANEL
Anion gap: 12 (ref 5–15)
BUN: 35 mg/dL — ABNORMAL HIGH (ref 6–20)
CHLORIDE: 101 mmol/L (ref 101–111)
CO2: 25 mmol/L (ref 22–32)
CREATININE: 1.72 mg/dL — AB (ref 0.61–1.24)
Calcium: 9.4 mg/dL (ref 8.9–10.3)
GFR calc Af Amer: 40 mL/min — ABNORMAL LOW (ref >60)
GFR calc non Af Amer: 35 mL/min — ABNORMAL LOW (ref >60)
Glucose, Bld: 114 mg/dL — ABNORMAL HIGH (ref 65–99)
Potassium: 4.8 mmol/L (ref 3.5–5.1)
SODIUM: 138 mmol/L (ref 135–145)

## 2018-02-07 LAB — GLUCOSE, CAPILLARY: Glucose-Capillary: 122 mg/dL — ABNORMAL HIGH (ref 65–99)

## 2018-02-07 LAB — HEMOGLOBIN A1C
HEMOGLOBIN A1C: 5.2 % (ref 4.8–5.6)
Mean Plasma Glucose: 102.54 mg/dL

## 2018-02-07 LAB — CBC
HCT: 36.4 % — ABNORMAL LOW (ref 39.0–52.0)
HEMOGLOBIN: 10.4 g/dL — AB (ref 13.0–17.0)
MCH: 22.9 pg — ABNORMAL LOW (ref 26.0–34.0)
MCHC: 28.6 g/dL — AB (ref 30.0–36.0)
MCV: 80 fL (ref 78.0–100.0)
Platelets: 211 10*3/uL (ref 150–400)
RBC: 4.55 MIL/uL (ref 4.22–5.81)
RDW: 17 % — ABNORMAL HIGH (ref 11.5–15.5)
WBC: 10.6 10*3/uL — ABNORMAL HIGH (ref 4.0–10.5)

## 2018-02-07 LAB — SURGICAL PCR SCREEN
MRSA, PCR: NEGATIVE
STAPHYLOCOCCUS AUREUS: NEGATIVE

## 2018-02-07 LAB — ABO/RH: ABO/RH(D): O POS

## 2018-02-07 NOTE — Pre-Procedure Instructions (Signed)
[facility] Thomas  02/07/2018      [id number], [place] - [address] [place] [address] Phone: [phone] Fax: [phone]    Your procedure is scheduled on, Tuesday,  May 7   Report to [facility] Admitting at 1000 A.M.             (posted surgery time 12n - 4:45p)   Call this number if you have problems the morning of surgery:  [phone]   Remember:   Do not eat food or drink liquids after midnight, Monday.  Continue all medications as directed by your physician except follow these medication instructions before surgery below   Take these medicines the morning of surgery with A SIP OF WATER  metoprolol succinate (TOPROL-XL) make sure you take the night before surgery if that is when you normally take  DO NOT TAKE YOUR LOSARTAN THE DAY BEFORE SURGERY. (This is per your Renal doctor)  7 days prior to surgery STOP taking any Aspirin(unless otherwise instructed by your surgeon), Aleve, Naproxen, Ibuprofen, Motrin, Advil, Goody's, BC's, all herbal medications, fish oil, and all vitamins  FOLLOW Physicians instructions about Xarelto   WHAT DO I DO ABOUT MY DIABETES MEDICATION?   Marland Kitchen Do not take oral diabetes medicines (pills) the morning of surgery.  . THE NIGHT BEFORE SURGERY, take ___17___ units of __NOVOLOG MIX 70/30 FLEXPEN (70-30)_________insulin.       . THE MORNING OF SURGERY, take ___0____ units of _NOVOLOG MIX 70/30 FLEXPEN (70-30)__insulin.  . The day of surgery, do not take other diabetes injectables, including Byetta (exenatide), Bydureon (exenatide ER), Victoza (liraglutide), or Trulicity (dulaglutide).  . If your CBG is greater than 220 mg/dL, you may take  of your sliding scale (correction) dose of insulin.   How to Manage Your Diabetes Before and After Surgery  Why is it important to control my blood sugar before and after surgery? . Improving blood sugar levels before and after surgery helps  healing and can limit problems. . A way of improving blood sugar control is eating a healthy diet by: o  Eating less sugar and carbohydrates o  Increasing activity/exercise o  Talking with your doctor about reaching your blood sugar goals . High blood sugars (greater than 180 mg/dL) can raise your risk of infections and slow your recovery, so you will need to focus on controlling your diabetes during the weeks before surgery. . Make sure that the doctor who takes care of your diabetes knows about your planned surgery including the date and location.  How do I manage my blood sugar before surgery? . Check your blood sugar at least 4 times a day, starting 2 days before surgery, to make sure that the level is not too high or low. o Check your blood sugar the morning of your surgery when you wake up and every 2 hours until you get to the Short Stay unit.  . If your blood sugar is less than 70 mg/dL, you will need to treat for low blood sugar: o Do not take insulin. o Treat a low blood sugar (less than 70 mg/dL) with  cup of clear juice (cranberry or apple), 4 glucose tablets, OR glucose gel.                    DO NOT DRINK ORANGE JUICE.  o Recheck blood sugar in 15 minutes after treatment (to make sure it is greater than 70  mg/dL). If your blood sugar is not greater than 70 mg/dL on recheck, call [phone] for further instructions. . Report your blood sugar to the short stay nurse when you get to Short Stay.  . If you are admitted to the hospital after surgery: o Your blood sugar will be checked by the staff and you will probably be given insulin after surgery (instead of oral diabetes medicines) to make sure you have good blood sugar levels. o The goal for blood sugar control after surgery is 80-180 mg/dL.    Do not wear jewelry - no rings or watches.  Do not wear lotions,  cologne, or deodorant.  Men may shave face and neck.  Do not bring valuables to the hospital.  [facility] is not  responsible for any belongings or valuables.  Contacts, dentures or bridgework may not be worn into surgery.  Leave your suitcase in the car.  After surgery it may be brought to your room.  For patients admitted to the hospital, discharge time will be determined by your treatment team.   [facility]- Preparing For Surgery  Before surgery, you can play an important role. Because skin is not sterile, your skin needs to be as free of germs as possible. You can reduce the number of germs on your skin by washing with CHG (chlorahexidine gluconate) Soap before surgery.  CHG is an antiseptic cleaner which kills germs and bonds with the skin to continue killing germs even after washing.  Please do not use if you have an allergy to CHG or antibacterial soaps. If your skin becomes reddened/irritated stop using the CHG.  Do not shave (including legs and underarms) for at least 48 hours prior to first CHG shower. It is OK to shave your face.  Please follow these instructions carefully.   1. Shower the NIGHT BEFORE SURGERY and the MORNING OF SURGERY with CHG.   2. If you chose to wash your hair, wash your hair first as usual with your normal shampoo.  3. After you shampoo, rinse your hair and body thoroughly to remove the shampoo.  4. Use CHG as you would any other liquid soap. You can apply CHG directly to the skin and wash gently with a scrungie or a clean washcloth.   5. Apply the CHG Soap to your body ONLY FROM THE NECK DOWN.  Do not use on open wounds or open sores. Avoid contact with your eyes, ears, mouth and genitals (private parts). Wash Face and genitals (private parts)  with your normal soap.  6. Wash thoroughly, paying special attention to the area where your surgery will be performed.  7. Thoroughly rinse your body with warm water from the neck down.  8. DO NOT shower/wash with your normal soap after using and rinsing off the CHG Soap.  9. Pat yourself dry with a CLEAN  TOWEL.  10. Wear CLEAN PAJAMAS to bed the night before surgery, wear comfortable clothes the morning of surgery  11. Place CLEAN SHEETS on your bed the night of your first shower and DO NOT SLEEP WITH PETS.    Day of Surgery: Do not apply any deodorants/lotions. Please wear clean clothes to the hospital/surgery center.      Please read over the following fact sheets that you were given.

## 2018-02-07 NOTE — Pre-Procedure Instructions (Signed)
Gary Thomas  02/07/2018      [id number] [place], [place] - [address] [name] [address] Phone: [phone] Fax: [phone]    Your procedure is scheduled on, Tuesday,  May 7   Report to [facility] Admitting at 1000 A.M.             (posted surgery time 12n - 4:45p)   Call this number if you have problems the morning of surgery:  [phone]   Remember:   Do not eat food or drink liquids after midnight, Monday.  Continue all medications as directed by your physician except follow these medication instructions before surgery below   Take these medicines the morning of surgery with A SIP OF WATER  metoprolol succinate (TOPROL-XL) make sure you take the night before surgery if that is when you normally take  7 days prior to surgery STOP taking any Aspirin(unless otherwise instructed by your surgeon), Aleve, Naproxen, Ibuprofen, Motrin, Advil, Goody's, BC's, all herbal medications, fish oil, and all vitamins  FOLLOW Physicians instructions about Xarelto   WHAT DO I DO ABOUT MY DIABETES MEDICATION?   Marland Kitchen Do not take oral diabetes medicines (pills) the morning of surgery.  . THE NIGHT BEFORE SURGERY, take ___17___ units of __NOVOLOG MIX 70/30 FLEXPEN (70-30)_________insulin.       . THE MORNING OF SURGERY, take ___0____ units of _NOVOLOG MIX 70/30 FLEXPEN (70-30)__insulin.  . The day of surgery, do not take other diabetes injectables, including Byetta (exenatide), Bydureon (exenatide ER), Victoza (liraglutide), or Trulicity (dulaglutide).  . If your CBG is greater than 220 mg/dL, you may take  of your sliding scale (correction) dose of insulin.   How to Manage Your Diabetes Before and After Surgery  Why is it important to control my blood sugar before and after surgery? . Improving blood sugar levels before and after surgery helps healing and can limit problems. . A way of improving blood sugar control is eating a  healthy diet by: o  Eating less sugar and carbohydrates o  Increasing activity/exercise o  Talking with your doctor about reaching your blood sugar goals . High blood sugars (greater than 180 mg/dL) can raise your risk of infections and slow your recovery, so you will need to focus on controlling your diabetes during the weeks before surgery. . Make sure that the doctor who takes care of your diabetes knows about your planned surgery including the date and location.  How do I manage my blood sugar before surgery? . Check your blood sugar at least 4 times a day, starting 2 days before surgery, to make sure that the level is not too high or low. o Check your blood sugar the morning of your surgery when you wake up and every 2 hours until you get to the Short Stay unit.  . If your blood sugar is less than 70 mg/dL, you will need to treat for low blood sugar: o Do not take insulin. o Treat a low blood sugar (less than 70 mg/dL) with  cup of clear juice (cranberry or apple), 4 glucose tablets, OR glucose gel.                    DO NOT DRINK ORANGE JUICE.  o Recheck blood sugar in 15 minutes after treatment (to make sure it is greater than 70 mg/dL). If your blood sugar is not greater than 70 mg/dL on recheck, call [phone] for  further instructions. . Report your blood sugar to the short stay nurse when you get to Short Stay.  . If you are admitted to the hospital after surgery: o Your blood sugar will be checked by the staff and you will probably be given insulin after surgery (instead of oral diabetes medicines) to make sure you have good blood sugar levels. o The goal for blood sugar control after surgery is 80-180 mg/dL.    Do not wear jewelry - no rings or watches.  Do not wear lotions,  cologne, or deodorant.  Men may shave face and neck.  Do not bring valuables to the hospital.  [facility] is not responsible for any belongings or valuables.  Contacts, dentures or bridgework may  not be worn into surgery.  Leave your suitcase in the car.  After surgery it may be brought to your room.  For patients admitted to the hospital, discharge time will be determined by your treatment team.   [facility]- Preparing For Surgery  Before surgery, you can play an important role. Because skin is not sterile, your skin needs to be as free of germs as possible. You can reduce the number of germs on your skin by washing with CHG (chlorahexidine gluconate) Soap before surgery.  CHG is an antiseptic cleaner which kills germs and bonds with the skin to continue killing germs even after washing.  Please do not use if you have an allergy to CHG or antibacterial soaps. If your skin becomes reddened/irritated stop using the CHG.  Do not shave (including legs and underarms) for at least 48 hours prior to first CHG shower. It is OK to shave your face.  Please follow these instructions carefully.   1. Shower the NIGHT BEFORE SURGERY and the MORNING OF SURGERY with CHG.   2. If you chose to wash your hair, wash your hair first as usual with your normal shampoo.  3. After you shampoo, rinse your hair and body thoroughly to remove the shampoo.  4. Use CHG as you would any other liquid soap. You can apply CHG directly to the skin and wash gently with a scrungie or a clean washcloth.   5. Apply the CHG Soap to your body ONLY FROM THE NECK DOWN.  Do not use on open wounds or open sores. Avoid contact with your eyes, ears, mouth and genitals (private parts). Wash Face and genitals (private parts)  with your normal soap.  6. Wash thoroughly, paying special attention to the area where your surgery will be performed.  7. Thoroughly rinse your body with warm water from the neck down.  8. DO NOT shower/wash with your normal soap after using and rinsing off the CHG Soap.  9. Pat yourself dry with a CLEAN TOWEL.  10. Wear CLEAN PAJAMAS to bed the night before surgery, wear comfortable clothes the  morning of surgery  11. Place CLEAN SHEETS on your bed the night of your first shower and DO NOT SLEEP WITH PETS.    Day of Surgery: Do not apply any deodorants/lotions. Please wear clean clothes to the hospital/surgery center.      Please read over the following fact sheets that you were given.

## 2018-02-07 NOTE — Progress Notes (Signed)
PCP is Dr. Charyl Dancer  LOV 01/2018.   He also regulates his xarelto.  (I did call Nikki at Dr. Fredrich Birks office) to inquire as to when patient needs to stop xarelto.  She has not heard anything yet and will reach out to them again.  I did tell the patient to call Dr. Judithann Sheen' office when he gets home to get that information also.  Dr. Judithann Sheen also aware of his OSA.   Patient doesn't know settings. Nephrologist is Dr. Thedore Mins in [place]  LOV 12/2017 Endocrinologist is Dr. Carlena Sax.  LOV 09/2017, last A1C 5.5 09/2017 Patient does check his sugar once or twice a day.  Ranges from 120 - 185. Had prostate surgery awhile ago.  Had continual leakage and now wears a urostomy bag. He states that back in the 80's - 90's, he had his carotids worked on and his aorta has patch.  All done by a Dr. Duffy Rhody in [place]. Patient did state that after nasal surgery, he had a blood clot to lungs. Denies a-fib, (today, SR with PAC's) sob, cp.

## 2018-02-08 ENCOUNTER — Encounter (HOSPITAL_COMMUNITY): Payer: Self-pay

## 2018-02-15 ENCOUNTER — Encounter (HOSPITAL_COMMUNITY): Payer: Self-pay | Admitting: Anesthesiology

## 2018-02-15 ENCOUNTER — Inpatient Hospital Stay (HOSPITAL_COMMUNITY): Payer: Medicare Other | Admitting: Anesthesiology

## 2018-02-15 ENCOUNTER — Inpatient Hospital Stay (HOSPITAL_COMMUNITY): Payer: Medicare Other | Admitting: Vascular Surgery

## 2018-02-15 ENCOUNTER — Inpatient Hospital Stay (HOSPITAL_COMMUNITY): Payer: Medicare Other

## 2018-02-15 ENCOUNTER — Inpatient Hospital Stay (HOSPITAL_COMMUNITY)
Admission: RE | Disposition: A | Discharge status: Skilled Nursing Facility | Payer: Self-pay | Source: Home / Self Care | Attending: Neurosurgery

## 2018-02-15 ENCOUNTER — Inpatient Hospital Stay (HOSPITAL_COMMUNITY)
Admission: RE | Admit: 2018-02-15 | Discharge: 2018-02-21 | DRG: 453 | Disposition: A | Discharge status: Skilled Nursing Facility | Payer: Medicare Other | Attending: Neurosurgery | Admitting: Neurosurgery

## 2018-02-15 DIAGNOSIS — E1122 Type 2 diabetes mellitus with diabetic chronic kidney disease: Secondary | ICD-10-CM | POA: Diagnosis present

## 2018-02-15 DIAGNOSIS — Z96651 Presence of right artificial knee joint: Secondary | ICD-10-CM | POA: Diagnosis present

## 2018-02-15 DIAGNOSIS — N170 Acute kidney failure with tubular necrosis: Secondary | ICD-10-CM | POA: Diagnosis not present

## 2018-02-15 DIAGNOSIS — M48062 Spinal stenosis, lumbar region with neurogenic claudication: Principal | ICD-10-CM | POA: Diagnosis present

## 2018-02-15 DIAGNOSIS — Z96641 Presence of right artificial hip joint: Secondary | ICD-10-CM | POA: Diagnosis present

## 2018-02-15 DIAGNOSIS — Z86711 Personal history of pulmonary embolism: Secondary | ICD-10-CM

## 2018-02-15 DIAGNOSIS — N183 Chronic kidney disease, stage 3 (moderate): Secondary | ICD-10-CM | POA: Diagnosis present

## 2018-02-15 DIAGNOSIS — Z8673 Personal history of transient ischemic attack (TIA), and cerebral infarction without residual deficits: Secondary | ICD-10-CM

## 2018-02-15 DIAGNOSIS — Z6833 Body mass index (BMI) 33.0-33.9, adult: Secondary | ICD-10-CM | POA: Diagnosis not present

## 2018-02-15 DIAGNOSIS — I9581 Postprocedural hypotension: Secondary | ICD-10-CM | POA: Diagnosis not present

## 2018-02-15 DIAGNOSIS — M545 Low back pain: Secondary | ICD-10-CM | POA: Diagnosis present

## 2018-02-15 DIAGNOSIS — Z8546 Personal history of malignant neoplasm of prostate: Secondary | ICD-10-CM

## 2018-02-15 DIAGNOSIS — Z419 Encounter for procedure for purposes other than remedying health state, unspecified: Secondary | ICD-10-CM

## 2018-02-15 DIAGNOSIS — M4696 Unspecified inflammatory spondylopathy, lumbar region: Secondary | ICD-10-CM | POA: Diagnosis present

## 2018-02-15 DIAGNOSIS — Z9841 Cataract extraction status, right eye: Secondary | ICD-10-CM

## 2018-02-15 DIAGNOSIS — Z9842 Cataract extraction status, left eye: Secondary | ICD-10-CM | POA: Diagnosis not present

## 2018-02-15 DIAGNOSIS — Z7901 Long term (current) use of anticoagulants: Secondary | ICD-10-CM

## 2018-02-15 DIAGNOSIS — M5116 Intervertebral disc disorders with radiculopathy, lumbar region: Secondary | ICD-10-CM | POA: Diagnosis present

## 2018-02-15 DIAGNOSIS — M4807 Spinal stenosis, lumbosacral region: Secondary | ICD-10-CM | POA: Diagnosis present

## 2018-02-15 DIAGNOSIS — Z961 Presence of intraocular lens: Secondary | ICD-10-CM | POA: Diagnosis present

## 2018-02-15 DIAGNOSIS — K219 Gastro-esophageal reflux disease without esophagitis: Secondary | ICD-10-CM | POA: Diagnosis present

## 2018-02-15 DIAGNOSIS — M4316 Spondylolisthesis, lumbar region: Secondary | ICD-10-CM | POA: Diagnosis present

## 2018-02-15 DIAGNOSIS — I129 Hypertensive chronic kidney disease with stage 1 through stage 4 chronic kidney disease, or unspecified chronic kidney disease: Secondary | ICD-10-CM | POA: Diagnosis present

## 2018-02-15 DIAGNOSIS — Z936 Other artificial openings of urinary tract status: Secondary | ICD-10-CM

## 2018-02-15 DIAGNOSIS — G4730 Sleep apnea, unspecified: Secondary | ICD-10-CM | POA: Diagnosis present

## 2018-02-15 DIAGNOSIS — E875 Hyperkalemia: Secondary | ICD-10-CM | POA: Diagnosis not present

## 2018-02-15 DIAGNOSIS — E669 Obesity, unspecified: Secondary | ICD-10-CM | POA: Diagnosis present

## 2018-02-15 DIAGNOSIS — D62 Acute posthemorrhagic anemia: Secondary | ICD-10-CM | POA: Diagnosis not present

## 2018-02-15 DIAGNOSIS — Z885 Allergy status to narcotic agent status: Secondary | ICD-10-CM

## 2018-02-15 DIAGNOSIS — Z794 Long term (current) use of insulin: Secondary | ICD-10-CM

## 2018-02-15 DIAGNOSIS — Z86718 Personal history of other venous thrombosis and embolism: Secondary | ICD-10-CM

## 2018-02-15 DIAGNOSIS — Z823 Family history of stroke: Secondary | ICD-10-CM

## 2018-02-15 DIAGNOSIS — Z79899 Other long term (current) drug therapy: Secondary | ICD-10-CM

## 2018-02-15 DIAGNOSIS — Z8249 Family history of ischemic heart disease and other diseases of the circulatory system: Secondary | ICD-10-CM

## 2018-02-15 LAB — APTT: aPTT: 29 seconds (ref 24–36)

## 2018-02-15 LAB — PROTIME-INR
INR: 1.06
Prothrombin Time: 13.8 seconds (ref 11.4–15.2)

## 2018-02-15 LAB — GLUCOSE, CAPILLARY
GLUCOSE-CAPILLARY: 113 mg/dL — AB (ref 65–99)
GLUCOSE-CAPILLARY: 197 mg/dL — AB (ref 65–99)
GLUCOSE-CAPILLARY: 236 mg/dL — AB (ref 65–99)
Glucose-Capillary: 138 mg/dL — ABNORMAL HIGH (ref 65–99)

## 2018-02-15 SURGERY — POSTERIOR LUMBAR FUSION 3 LEVEL
Anesthesia: General | Site: Spine Lumbar

## 2018-02-15 MED ORDER — LOSARTAN POTASSIUM 50 MG PO TABS
25.0000 mg | ORAL_TABLET | Freq: Every day | ORAL | Status: DC
  Administered 2018-02-15 – 2018-02-16 (×2): 25 mg via ORAL
  Filled 2018-02-15 (×2): qty 1

## 2018-02-15 MED ORDER — HYDROCODONE-ACETAMINOPHEN 5-325 MG PO TABS
1.0000 | ORAL_TABLET | ORAL | Status: DC | PRN
  Administered 2018-02-16: 1 via ORAL
  Filled 2018-02-15: qty 1

## 2018-02-15 MED ORDER — SODIUM CHLORIDE 0.9 % IV SOLN
INTRAVENOUS | Status: DC
  Administered 2018-02-15 (×2): via INTRAVENOUS

## 2018-02-15 MED ORDER — MENTHOL 3 MG MT LOZG: 1.0000 | LOZENGE | OROMUCOSAL | Status: DC | PRN

## 2018-02-15 MED ORDER — FUROSEMIDE 40 MG PO TABS
40.0000 mg | ORAL_TABLET | Freq: Every day | ORAL | Status: DC
  Administered 2018-02-16 – 2018-02-21 (×5): 40 mg via ORAL
  Filled 2018-02-15 (×6): qty 1

## 2018-02-15 MED ORDER — PROMETHAZINE HCL 25 MG/ML IJ SOLN: 6.2500 mg | INTRAMUSCULAR | Status: DC | PRN

## 2018-02-15 MED ORDER — METHOCARBAMOL 500 MG PO TABS
ORAL_TABLET | ORAL | Status: AC
  Administered 2018-02-15: 23:00:00
  Filled 2018-02-15: qty 1

## 2018-02-15 MED ORDER — FENTANYL CITRATE (PF) 250 MCG/5ML IJ SOLN
INTRAMUSCULAR | Status: DC | PRN
  Administered 2018-02-15 (×4): 25 ug via INTRAVENOUS
  Administered 2018-02-15: 50 ug via INTRAVENOUS
  Administered 2018-02-15: 25 ug via INTRAVENOUS
  Administered 2018-02-15: 50 ug via INTRAVENOUS
  Administered 2018-02-15: 25 ug via INTRAVENOUS

## 2018-02-15 MED ORDER — METHOCARBAMOL 1000 MG/10ML IJ SOLN
500.0000 mg | Freq: Four times a day (QID) | INTRAMUSCULAR | Status: DC | PRN
  Filled 2018-02-15: qty 5

## 2018-02-15 MED ORDER — SERTRALINE HCL 100 MG PO TABS
100.0000 mg | ORAL_TABLET | Freq: Every day | ORAL | Status: DC
  Administered 2018-02-15 – 2018-02-20 (×6): 100 mg via ORAL
  Filled 2018-02-15 (×6): qty 1

## 2018-02-15 MED ORDER — POLYETHYLENE GLYCOL 3350 17 G PO PACK
17.0000 g | PACK | Freq: Every day | ORAL | Status: DC | PRN
  Administered 2018-02-18: 17 g via ORAL
  Filled 2018-02-15: qty 1

## 2018-02-15 MED ORDER — ONDANSETRON HCL 4 MG PO TABS: 4.0000 mg | ORAL_TABLET | Freq: Four times a day (QID) | ORAL | Status: DC | PRN

## 2018-02-15 MED ORDER — INSULIN ASPART 100 UNIT/ML ~~LOC~~ SOLN
4.0000 [IU] | Freq: Three times a day (TID) | SUBCUTANEOUS | Status: DC
  Administered 2018-02-19 – 2018-02-20 (×3): 4 [IU] via SUBCUTANEOUS

## 2018-02-15 MED ORDER — SODIUM CHLORIDE 0.9 % IV SOLN: 250.0000 mL | INTRAVENOUS | Status: DC

## 2018-02-15 MED ORDER — SODIUM CHLORIDE 0.9% FLUSH
3.0000 mL | Freq: Two times a day (BID) | INTRAVENOUS | Status: DC
  Administered 2018-02-15 – 2018-02-20 (×11): 3 mL via INTRAVENOUS

## 2018-02-15 MED ORDER — INSULIN ASPART PROT & ASPART (70-30 MIX) 100 UNIT/ML PEN: 25.0000 [IU] | PEN_INJECTOR | Freq: Two times a day (BID) | SUBCUTANEOUS | Status: DC

## 2018-02-15 MED ORDER — PROPOFOL 10 MG/ML IV BOLUS
INTRAVENOUS | Status: AC
  Filled 2018-02-15: qty 20

## 2018-02-15 MED ORDER — DEXAMETHASONE SODIUM PHOSPHATE 10 MG/ML IJ SOLN
INTRAMUSCULAR | Status: DC | PRN
  Administered 2018-02-15: 5 mg via INTRAVENOUS

## 2018-02-15 MED ORDER — LIDOCAINE-EPINEPHRINE 1 %-1:100000 IJ SOLN
INTRAMUSCULAR | Status: AC
  Filled 2018-02-15: qty 1

## 2018-02-15 MED ORDER — THROMBIN 5000 UNITS EX SOLR
CUTANEOUS | Status: AC
  Filled 2018-02-15: qty 5000

## 2018-02-15 MED ORDER — KCL IN DEXTROSE-NACL 20-5-0.45 MEQ/L-%-% IV SOLN
INTRAVENOUS | Status: DC
  Administered 2018-02-15 – 2018-02-16 (×3): via INTRAVENOUS
  Filled 2018-02-15 (×3): qty 1000

## 2018-02-15 MED ORDER — FENTANYL CITRATE (PF) 250 MCG/5ML IJ SOLN
INTRAMUSCULAR | Status: AC
  Filled 2018-02-15: qty 5

## 2018-02-15 MED ORDER — ALBUMIN HUMAN 5 % IV SOLN
INTRAVENOUS | Status: DC | PRN
  Administered 2018-02-15: 14:00:00 via INTRAVENOUS

## 2018-02-15 MED ORDER — ALUM & MAG HYDROXIDE-SIMETH 200-200-20 MG/5ML PO SUSP: 30.0000 mL | Freq: Four times a day (QID) | ORAL | Status: DC | PRN

## 2018-02-15 MED ORDER — EPHEDRINE SULFATE-NACL 50-0.9 MG/10ML-% IV SOSY
PREFILLED_SYRINGE | INTRAVENOUS | Status: DC | PRN
  Administered 2018-02-15: 5 mg via INTRAVENOUS

## 2018-02-15 MED ORDER — ONDANSETRON HCL 4 MG/2ML IJ SOLN
INTRAMUSCULAR | Status: AC
  Filled 2018-02-15: qty 2

## 2018-02-15 MED ORDER — SUGAMMADEX SODIUM 200 MG/2ML IV SOLN
INTRAVENOUS | Status: DC | PRN
  Administered 2018-02-15: 200 mg via INTRAVENOUS

## 2018-02-15 MED ORDER — ATORVASTATIN CALCIUM 10 MG PO TABS
20.0000 mg | ORAL_TABLET | Freq: Every day | ORAL | Status: DC
  Administered 2018-02-15 – 2018-02-20 (×6): 20 mg via ORAL
  Filled 2018-02-15 (×6): qty 2

## 2018-02-15 MED ORDER — ZOLPIDEM TARTRATE 5 MG PO TABS: 5.0000 mg | ORAL_TABLET | Freq: Every evening | ORAL | Status: DC | PRN

## 2018-02-15 MED ORDER — ROCURONIUM BROMIDE 50 MG/5ML IV SOLN
INTRAVENOUS | Status: AC
  Filled 2018-02-15: qty 1

## 2018-02-15 MED ORDER — ACETAMINOPHEN 10 MG/ML IV SOLN
1000.0000 mg | Freq: Once | INTRAVENOUS | Status: DC | PRN
  Administered 2018-02-15: 1000 mg via INTRAVENOUS

## 2018-02-15 MED ORDER — ROCURONIUM BROMIDE 10 MG/ML (PF) SYRINGE
PREFILLED_SYRINGE | INTRAVENOUS | Status: DC | PRN
  Administered 2018-02-15 (×4): 5 mg via INTRAVENOUS
  Administered 2018-02-15: 10 mg via INTRAVENOUS
  Administered 2018-02-15: 50 mg via INTRAVENOUS
  Administered 2018-02-15: 20 mg via INTRAVENOUS

## 2018-02-15 MED ORDER — ONDANSETRON HCL 4 MG/2ML IJ SOLN
4.0000 mg | Freq: Four times a day (QID) | INTRAMUSCULAR | Status: DC | PRN
  Administered 2018-02-16 (×2): 4 mg via INTRAVENOUS
  Filled 2018-02-15 (×3): qty 2

## 2018-02-15 MED ORDER — MIDAZOLAM HCL 2 MG/2ML IJ SOLN
INTRAMUSCULAR | Status: AC
  Filled 2018-02-15: qty 2

## 2018-02-15 MED ORDER — FLEET ENEMA 7-19 GM/118ML RE ENEM
1.0000 | ENEMA | Freq: Once | RECTAL | Status: DC | PRN
Start: 2018-02-15 — End: 2018-02-21

## 2018-02-15 MED ORDER — SUGAMMADEX SODIUM 200 MG/2ML IV SOLN
INTRAVENOUS | Status: AC
  Filled 2018-02-15: qty 2

## 2018-02-15 MED ORDER — DOCUSATE SODIUM 100 MG PO CAPS
100.0000 mg | ORAL_CAPSULE | Freq: Two times a day (BID) | ORAL | Status: DC
Start: 2018-02-15 — End: 2018-02-21
  Administered 2018-02-15 – 2018-02-21 (×11): 100 mg via ORAL
  Filled 2018-02-15 (×12): qty 1

## 2018-02-15 MED ORDER — KCL IN DEXTROSE-NACL 20-5-0.45 MEQ/L-%-% IV SOLN
INTRAVENOUS | Status: AC
  Filled 2018-02-15: qty 1000

## 2018-02-15 MED ORDER — DEXAMETHASONE SODIUM PHOSPHATE 10 MG/ML IJ SOLN
INTRAMUSCULAR | Status: AC
  Filled 2018-02-15: qty 1

## 2018-02-15 MED ORDER — ACETAMINOPHEN 325 MG PO TABS
650.0000 mg | ORAL_TABLET | ORAL | Status: DC | PRN
  Administered 2018-02-16 – 2018-02-19 (×3): 650 mg via ORAL
  Filled 2018-02-15 (×3): qty 2

## 2018-02-15 MED ORDER — HYDROMORPHONE HCL 1 MG/ML IJ SOLN
1.0000 mg | INTRAMUSCULAR | Status: DC | PRN
  Administered 2018-02-15 – 2018-02-17 (×5): 1 mg via INTRAVENOUS
  Filled 2018-02-15 (×5): qty 1

## 2018-02-15 MED ORDER — SODIUM CHLORIDE 0.9 % IV SOLN
INTRAVENOUS | Status: DC | PRN
  Administered 2018-02-15: 12:00:00 via INTRAVENOUS

## 2018-02-15 MED ORDER — ACETAMINOPHEN 650 MG RE SUPP: 650.0000 mg | RECTAL | Status: DC | PRN

## 2018-02-15 MED ORDER — FENTANYL CITRATE (PF) 100 MCG/2ML IJ SOLN
25.0000 ug | INTRAMUSCULAR | Status: DC | PRN
  Administered 2018-02-15: 50 ug via INTRAVENOUS
  Administered 2018-02-15 (×2): 25 ug via INTRAVENOUS
  Administered 2018-02-15: 50 ug via INTRAVENOUS

## 2018-02-15 MED ORDER — SODIUM CHLORIDE 0.9 % IJ SOLN
INTRAMUSCULAR | Status: DC | PRN
  Administered 2018-02-15: 20 mL

## 2018-02-15 MED ORDER — PHENYLEPHRINE 40 MCG/ML (10ML) SYRINGE FOR IV PUSH (FOR BLOOD PRESSURE SUPPORT)
PREFILLED_SYRINGE | INTRAVENOUS | Status: AC
  Filled 2018-02-15: qty 10

## 2018-02-15 MED ORDER — LIDOCAINE 2% (20 MG/ML) 5 ML SYRINGE
INTRAMUSCULAR | Status: AC
  Filled 2018-02-15: qty 5

## 2018-02-15 MED ORDER — GLYCOPYRROLATE 0.2 MG/ML IJ SOLN
INTRAMUSCULAR | Status: DC | PRN
  Administered 2018-02-15: 0.2 mg via INTRAVENOUS

## 2018-02-15 MED ORDER — CHLORHEXIDINE GLUCONATE CLOTH 2 % EX PADS: 6.0000 | MEDICATED_PAD | Freq: Once | CUTANEOUS | Status: DC

## 2018-02-15 MED ORDER — BUPIVACAINE LIPOSOME 1.3 % IJ SUSP
20.0000 mL | INTRAMUSCULAR | Status: AC
Start: 2018-02-15 — End: 2018-02-15
  Administered 2018-02-15: 20 mL
  Administered 2018-02-15: 266 mg
  Filled 2018-02-15: qty 20

## 2018-02-15 MED ORDER — BISACODYL 10 MG RE SUPP: 10.0000 mg | Freq: Every day | RECTAL | Status: DC | PRN

## 2018-02-15 MED ORDER — FENTANYL CITRATE (PF) 100 MCG/2ML IJ SOLN
INTRAMUSCULAR | Status: AC
  Administered 2018-02-15: 23:00:00
  Filled 2018-02-15: qty 2

## 2018-02-15 MED ORDER — PROPOFOL 10 MG/ML IV BOLUS
INTRAVENOUS | Status: DC | PRN
  Administered 2018-02-15: 160 mg via INTRAVENOUS

## 2018-02-15 MED ORDER — EPHEDRINE SULFATE 50 MG/ML IJ SOLN
INTRAMUSCULAR | Status: AC
  Filled 2018-02-15: qty 1

## 2018-02-15 MED ORDER — LIDOCAINE 2% (20 MG/ML) 5 ML SYRINGE
INTRAMUSCULAR | Status: DC | PRN
  Administered 2018-02-15: 60 mg via INTRAVENOUS

## 2018-02-15 MED ORDER — BUPIVACAINE HCL (PF) 0.5 % IJ SOLN
INTRAMUSCULAR | Status: DC | PRN
  Administered 2018-02-15: 10 mL

## 2018-02-15 MED ORDER — INSULIN ASPART 100 UNIT/ML ~~LOC~~ SOLN
0.0000 [IU] | Freq: Three times a day (TID) | SUBCUTANEOUS | Status: DC
  Administered 2018-02-16 – 2018-02-19 (×2): 3 [IU] via SUBCUTANEOUS
  Administered 2018-02-19: 2 [IU] via SUBCUTANEOUS

## 2018-02-15 MED ORDER — INSULIN ASPART PROT & ASPART (70-30 MIX) 100 UNIT/ML ~~LOC~~ SUSP
25.0000 [IU] | Freq: Two times a day (BID) | SUBCUTANEOUS | Status: DC
  Administered 2018-02-16 – 2018-02-21 (×7): 25 [IU] via SUBCUTANEOUS
  Filled 2018-02-15 (×2): qty 10

## 2018-02-15 MED ORDER — BUPIVACAINE HCL (PF) 0.5 % IJ SOLN
INTRAMUSCULAR | Status: AC
  Filled 2018-02-15: qty 30

## 2018-02-15 MED ORDER — FAMOTIDINE IN NACL 20-0.9 MG/50ML-% IV SOLN
20.0000 mg | Freq: Two times a day (BID) | INTRAVENOUS | Status: DC
  Administered 2018-02-15 – 2018-02-16 (×2): 20 mg via INTRAVENOUS
  Filled 2018-02-15 (×2): qty 50

## 2018-02-15 MED ORDER — HYDROCODONE-ACETAMINOPHEN 5-325 MG PO TABS
2.0000 | ORAL_TABLET | ORAL | Status: DC | PRN
  Administered 2018-02-16 – 2018-02-20 (×3): 2 via ORAL
  Filled 2018-02-15 (×3): qty 2

## 2018-02-15 MED ORDER — METHOCARBAMOL 500 MG PO TABS
500.0000 mg | ORAL_TABLET | Freq: Four times a day (QID) | ORAL | Status: DC | PRN
  Administered 2018-02-15: 500 mg via ORAL

## 2018-02-15 MED ORDER — ACETAMINOPHEN 10 MG/ML IV SOLN
INTRAVENOUS | Status: AC
  Filled 2018-02-15: qty 100

## 2018-02-15 MED ORDER — GELATIN ABSORBABLE MT POWD
OROMUCOSAL | Status: DC | PRN
  Administered 2018-02-15: 5 mL via TOPICAL
  Administered 2018-02-15: 13:00:00 via TOPICAL
  Administered 2018-02-15: 5 mL via TOPICAL

## 2018-02-15 MED ORDER — ARTIFICIAL TEARS OPHTHALMIC OINT
TOPICAL_OINTMENT | OPHTHALMIC | Status: AC
  Filled 2018-02-15: qty 3.5

## 2018-02-15 MED ORDER — OCUVITE-LUTEIN PO TABS
1.0000 | ORAL_TABLET | Freq: Every day | ORAL | Status: DC
  Administered 2018-02-15: 1 via ORAL
  Filled 2018-02-15 (×2): qty 1

## 2018-02-15 MED ORDER — MEPERIDINE HCL 50 MG/ML IJ SOLN: 6.2500 mg | INTRAMUSCULAR | Status: DC | PRN

## 2018-02-15 MED ORDER — CEFAZOLIN SODIUM-DEXTROSE 2-4 GM/100ML-% IV SOLN
2.0000 g | Freq: Three times a day (TID) | INTRAVENOUS | Status: AC
  Administered 2018-02-15 – 2018-02-16 (×2): 2 g via INTRAVENOUS
  Filled 2018-02-15 (×2): qty 100

## 2018-02-15 MED ORDER — 0.9 % SODIUM CHLORIDE (POUR BTL) OPTIME
TOPICAL | Status: DC | PRN
  Administered 2018-02-15 (×2): 1000 mL

## 2018-02-15 MED ORDER — PHENYLEPHRINE HCL 10 MG/ML IJ SOLN
INTRAVENOUS | Status: DC | PRN
  Administered 2018-02-15: 90 ug/min via INTRAVENOUS
  Administered 2018-02-15: 20 ug/min via INTRAVENOUS

## 2018-02-15 MED ORDER — SODIUM CHLORIDE 0.9% FLUSH: 3.0000 mL | INTRAVENOUS | Status: DC | PRN

## 2018-02-15 MED ORDER — INSULIN ASPART 100 UNIT/ML ~~LOC~~ SOLN: 0.0000 [IU] | Freq: Every day | SUBCUTANEOUS | Status: DC

## 2018-02-15 MED ORDER — PHENYLEPHRINE 40 MCG/ML (10ML) SYRINGE FOR IV PUSH (FOR BLOOD PRESSURE SUPPORT)
PREFILLED_SYRINGE | INTRAVENOUS | Status: DC | PRN
  Administered 2018-02-15: 40 ug via INTRAVENOUS
  Administered 2018-02-15: 80 ug via INTRAVENOUS
  Administered 2018-02-15: 40 ug via INTRAVENOUS
  Administered 2018-02-15: 80 ug via INTRAVENOUS

## 2018-02-15 MED ORDER — CEFAZOLIN SODIUM-DEXTROSE 2-4 GM/100ML-% IV SOLN
INTRAVENOUS | Status: AC
  Filled 2018-02-15: qty 100

## 2018-02-15 MED ORDER — ONDANSETRON HCL 4 MG/2ML IJ SOLN
INTRAMUSCULAR | Status: DC | PRN
  Administered 2018-02-15: 4 mg via INTRAVENOUS

## 2018-02-15 MED ORDER — CEFAZOLIN SODIUM-DEXTROSE 2-4 GM/100ML-% IV SOLN
2.0000 g | INTRAVENOUS | Status: AC
  Administered 2018-02-15: 2 g via INTRAVENOUS

## 2018-02-15 MED ORDER — PHENOL 1.4 % MT LIQD: 1.0000 | OROMUCOSAL | Status: DC | PRN

## 2018-02-15 MED ORDER — LIDOCAINE-EPINEPHRINE 1 %-1:100000 IJ SOLN
INTRAMUSCULAR | Status: DC | PRN
  Administered 2018-02-15: 10 mL

## 2018-02-15 MED ORDER — METOPROLOL SUCCINATE ER 100 MG PO TB24
100.0000 mg | ORAL_TABLET | Freq: Every day | ORAL | Status: DC
  Administered 2018-02-15 – 2018-02-16 (×2): 100 mg via ORAL
  Filled 2018-02-15 (×2): qty 1

## 2018-02-15 SURGICAL SUPPLY — 85 items
BASKET BONE COLLECTION (BASKET) ×3 IMPLANT
BLADE CLIPPER SURG (BLADE) IMPLANT
BUR MATCHSTICK NEURO 3.0 LAGG (BURR) ×3 IMPLANT
BUR PRECISION FLUTE 5.0 (BURR) ×3 IMPLANT
CANISTER SUCT 3000ML PPV (MISCELLANEOUS) ×3 IMPLANT
CARTRIDGE OIL MAESTRO DRILL (MISCELLANEOUS) ×1 IMPLANT
CONNECTOR RELINE 45-65 5.5 (Connector) ×2 IMPLANT
CONT SPEC 4OZ CLIKSEAL STRL BL (MISCELLANEOUS) ×3 IMPLANT
COVER BACK TABLE 60X90IN (DRAPES) ×3 IMPLANT
DECANTER SPIKE VIAL GLASS SM (MISCELLANEOUS) ×5 IMPLANT
DERMABOND ADVANCED (GAUZE/BANDAGES/DRESSINGS) ×2
DERMABOND ADVANCED .7 DNX12 (GAUZE/BANDAGES/DRESSINGS) ×1 IMPLANT
DIFFUSER DRILL AIR PNEUMATIC (MISCELLANEOUS) ×3 IMPLANT
DRAPE C-ARM 42X72 X-RAY (DRAPES) ×5 IMPLANT
DRAPE C-ARMOR (DRAPES) ×3 IMPLANT
DRAPE LAPAROTOMY 100X72X124 (DRAPES) ×3 IMPLANT
DRAPE SURG 17X23 STRL (DRAPES) ×3 IMPLANT
DRSG OPSITE POSTOP 4X8 (GAUZE/BANDAGES/DRESSINGS) ×2 IMPLANT
DURAPREP 26ML APPLICATOR (WOUND CARE) ×3 IMPLANT
ELECT BLADE 4.0 EZ CLEAN MEGAD (MISCELLANEOUS) ×3
ELECT REM PT RETURN 9FT ADLT (ELECTROSURGICAL) ×3
ELECTRODE BLDE 4.0 EZ CLN MEGD (MISCELLANEOUS) IMPLANT
ELECTRODE REM PT RTRN 9FT ADLT (ELECTROSURGICAL) ×1 IMPLANT
EVACUATOR 1/8 PVC DRAIN (DRAIN) ×2 IMPLANT
GAUZE SPONGE 4X4 12PLY STRL (GAUZE/BANDAGES/DRESSINGS) ×1 IMPLANT
GAUZE SPONGE 4X4 16PLY XRAY LF (GAUZE/BANDAGES/DRESSINGS) ×2 IMPLANT
GLOVE BIO SURGEON STRL SZ7 (GLOVE) ×4 IMPLANT
GLOVE BIO SURGEON STRL SZ8 (GLOVE) ×6 IMPLANT
GLOVE BIOGEL PI IND STRL 6.5 (GLOVE) IMPLANT
GLOVE BIOGEL PI IND STRL 7.5 (GLOVE) IMPLANT
GLOVE BIOGEL PI IND STRL 8 (GLOVE) ×2 IMPLANT
GLOVE BIOGEL PI IND STRL 8.5 (GLOVE) ×2 IMPLANT
GLOVE BIOGEL PI INDICATOR 6.5 (GLOVE) ×4
GLOVE BIOGEL PI INDICATOR 7.5 (GLOVE) ×2
GLOVE BIOGEL PI INDICATOR 8 (GLOVE) ×12
GLOVE BIOGEL PI INDICATOR 8.5 (GLOVE) ×4
GLOVE ECLIPSE 7.0 STRL STRAW (GLOVE) ×2 IMPLANT
GLOVE ECLIPSE 7.5 STRL STRAW (GLOVE) ×6 IMPLANT
GLOVE ECLIPSE 8.0 STRL XLNG CF (GLOVE) ×6 IMPLANT
GLOVE EXAM NITRILE LRG STRL (GLOVE) IMPLANT
GLOVE EXAM NITRILE XL STR (GLOVE) IMPLANT
GLOVE EXAM NITRILE XS STR PU (GLOVE) IMPLANT
GLOVE SURG SS PI 6.0 STRL IVOR (GLOVE) ×6 IMPLANT
GOWN STRL REUS W/ TWL LRG LVL3 (GOWN DISPOSABLE) IMPLANT
GOWN STRL REUS W/ TWL XL LVL3 (GOWN DISPOSABLE) ×2 IMPLANT
GOWN STRL REUS W/TWL 2XL LVL3 (GOWN DISPOSABLE) ×10 IMPLANT
GOWN STRL REUS W/TWL LRG LVL3 (GOWN DISPOSABLE) ×6
GOWN STRL REUS W/TWL XL LVL3 (GOWN DISPOSABLE) ×4
HEMOSTAT POWDER KIT SURGIFOAM (HEMOSTASIS) ×7 IMPLANT
KIT BASIN OR (CUSTOM PROCEDURE TRAY) ×3 IMPLANT
KIT POSITION SURG JACKSON T1 (MISCELLANEOUS) ×3 IMPLANT
KIT TURNOVER KIT B (KITS) ×3 IMPLANT
MILL MEDIUM DISP (BLADE) ×2 IMPLANT
NDL HYPO 21X1.5 SAFETY (NEEDLE) IMPLANT
NDL HYPO 25X1 1.5 SAFETY (NEEDLE) ×1 IMPLANT
NDL SPNL 18GX3.5 QUINCKE PK (NEEDLE) IMPLANT
NEEDLE HYPO 21X1.5 SAFETY (NEEDLE) ×6 IMPLANT
NEEDLE HYPO 25X1 1.5 SAFETY (NEEDLE) ×3 IMPLANT
NEEDLE SPNL 18GX3.5 QUINCKE PK (NEEDLE) ×3 IMPLANT
NS IRRIG 1000ML POUR BTL (IV SOLUTION) ×5 IMPLANT
OIL CARTRIDGE MAESTRO DRILL (MISCELLANEOUS) ×3
PACK LAMINECTOMY NEURO (CUSTOM PROCEDURE TRAY) ×3 IMPLANT
PAD ARMBOARD 7.5X6 YLW CONV (MISCELLANEOUS) ×11 IMPLANT
PATTIES SURGICAL .5 X.5 (GAUZE/BANDAGES/DRESSINGS) IMPLANT
PATTIES SURGICAL .5 X1 (DISPOSABLE) IMPLANT
PATTIES SURGICAL 1X1 (DISPOSABLE) ×4 IMPLANT
ROD RELINE-O 5.5X100 LORD (Rod) ×4 IMPLANT
SCREW LOCK RELINE 5.5 TULIP (Screw) ×16 IMPLANT
SCREW RELINE-O POLY 6.5X40 (Screw) ×4 IMPLANT
SCREW RELINE-O POLY 7.5X40 (Screw) ×8 IMPLANT
SCREW RELINE-O POLY 7.5X45 (Screw) ×4 IMPLANT
SPONGE LAP 4X18 X RAY DECT (DISPOSABLE) IMPLANT
SPONGE SURGIFOAM ABS GEL 100 (HEMOSTASIS) IMPLANT
STAPLER SKIN PROX WIDE 3.9 (STAPLE) IMPLANT
SUT VIC AB 1 CT1 18XBRD ANBCTR (SUTURE) ×2 IMPLANT
SUT VIC AB 1 CT1 8-18 (SUTURE) ×4
SUT VIC AB 2-0 CT1 18 (SUTURE) ×6 IMPLANT
SUT VIC AB 3-0 SH 8-18 (SUTURE) ×6 IMPLANT
SYR 20CC LL (SYRINGE) ×2 IMPLANT
SYR 5ML LL (SYRINGE) IMPLANT
SYRINGE 20CC LL (MISCELLANEOUS) ×1 IMPLANT
TOWEL GREEN STERILE (TOWEL DISPOSABLE) ×3 IMPLANT
TOWEL GREEN STERILE FF (TOWEL DISPOSABLE) ×3 IMPLANT
TRAY FOLEY MTR SLVR 16FR STAT (SET/KITS/TRAYS/PACK) ×1 IMPLANT
WATER STERILE IRR 1000ML POUR (IV SOLUTION) ×3 IMPLANT

## 2018-02-15 NOTE — Anesthesia Preprocedure Evaluation (Addendum)
Anesthesia Evaluation  Patient identified by MRN, date of birth, ID band Patient awake    Reviewed: Allergy & Precautions, NPO status , Patient's Chart, lab work & pertinent test results  Airway Mallampati: II  TM Distance: >3 FB Neck ROM: Full    Dental  (+) Dental Advisory Given, Edentulous Upper   Pulmonary sleep apnea , former smoker,    breath sounds clear to auscultation       Cardiovascular hypertension, Pt. on medications and Pt. on home beta blockers + Peripheral Vascular Disease   Rhythm:Regular Rate:Normal  Mild AS   Neuro/Psych PSYCHIATRIC DISORDERS Depression R side weakness CVA, Residual Symptoms    GI/Hepatic Neg liver ROS, GERD  ,  Endo/Other  diabetes, Type 2, Insulin Dependent  Renal/GU Renal InsufficiencyRenal disease     Musculoskeletal  (+) Arthritis , Osteoarthritis,    Abdominal Normal abdominal exam  (+) + obese,   Peds  Hematology negative hematology ROS (+)   Anesthesia Other Findings ALL codiene  Reproductive/Obstetrics                          Lab Results  Component Value Date   WBC 10.6 (H) 02/07/2018   HGB 10.4 (L) 02/07/2018   HCT 36.4 (L) 02/07/2018   MCV 80.0 02/07/2018   PLT 211 02/07/2018    Anesthesia Physical Anesthesia Plan  ASA: III  Anesthesia Plan: General   Post-op Pain Management:    Induction: Intravenous  PONV Risk Score and Plan: Treatment may vary due to age or medical condition  Airway Management Planned: Oral ETT  Additional Equipment: None  Intra-op Plan:   Post-operative Plan: Extubation in OR  Informed Consent: I have reviewed the patients History and Physical, chart, labs and discussed the procedure including the risks, benefits and alternatives for the proposed anesthesia with the patient or authorized representative who has indicated his/her understanding and acceptance.   Dental advisory given  Plan Discussed  with: CRNA  Anesthesia Plan Comments:        Anesthesia Quick Evaluation

## 2018-02-15 NOTE — OR Nursing (Signed)
Patient has a urostomy. Patient brought his own standard foley bag to attach to his urostomy bag.Bag was attached while patient in holding area.Doryce Mcgregory RN.

## 2018-02-15 NOTE — Op Note (Signed)
02/15/2018  4:50 PM  PATIENT:  Gary Thomas  84 y.o. male  PRE-OPERATIVE DIAGNOSIS:  Lumbar stenosis with neurogenic claudication, lumbar spondylolisthesis, herniated lumbar disc, radiculopathy, lumbago L 3 - S 1 levels  POST-OPERATIVE DIAGNOSIS:  Lumbar stenosis with neurogenic claudication, lumbar spondylolisthesis, herniated lumbar disc, radiculopathy, lumbago L 3 - S 1 levels  PROCEDURE:  Procedure(s): Lumbar Three-Four,Lumbar Four-Five, Lumbar Five-Sacral One Posterior lumbar  fusion. (N/A) decompression, pedicle crew fixation and posterolateral arthrodesis with local autograft  SURGEON:  Surgeon(s) and Role:    * Theordore Cisnero, MD - Primary    * Nundkumar, Neelesh, MD - Assisting  PHYSICIAN ASSISTANT:   ASSISTANTS: Poteat, RN   ANESTHESIA:   general  EBL:  650 mL   BLOOD ADMINISTERED:300 CC CELLSAVER  DRAINS: (Medium) Hemovact drain(s) in the epidural space with  Suction Open   LOCAL MEDICATIONS USED:  MARCAINE    and LIDOCAINE   SPECIMEN:  No Specimen  DISPOSITION OF SPECIMEN:  N/A  COUNTS:  YES  TOURNIQUET:  * No tourniquets in log *  DICTATION: DICTATION: Patient is 84-year-old man with  HNP, spondylosis, prior laminectomy at L 5 S 1 on the left, with spondylolisthesis,  stenosis, DDD, radiculopathy L 34, L 45, L 5 S 1 levels. He has a severe left leg pain and weakness. It was elected to take him to surgery for decompression and fusion at L 34, L 45,  L 5 S 1  levels.    Procedure: Patient was placed in a prone position on the Jackson table after smooth and uncomplicated induction of general endotracheal anesthesia. His low back was prepped and draped in usual sterile fashion with betadine scrub and DuraPrep after marking correct levels with C -arm. Area of incision was infiltrated with local lidocaine. Incision was made to the lumbodorsal fascia was incised and exposure was performed of the L 34, L 45, L 5 S 1 spinous processes laminae facet joint and transverse  processes. Intraoperative x-ray was obtained which confirmed correct orientation. A total laminectomy of L 3, L4 and L5 was performed with disarticulation of the facet joints at this level and thorough decompression was performed of both L 3, L4, L5 and S1 nerve roots along with the common dural tube. There was densely adherent spondylytic material compressing the thecal sac and both L 3,  L 4,  L 5 and S 1 nerve roots.   After a thorough decompression, the L 3, L 4, L 5, S 1 nerve roots and common dural tube were all decompressed.   The posterolateral region was extensively decorticated and pedicle probes were placed at L 3,  L 4, L 5 and S1 bilaterally. Intraoperative fluoroscopy confirmed correct orientationin the AP and lateral plane. 40 x 7.5 mm pedicle screws were placed at S1 bilaterally and 45 x 7.5 mm screws placed at L5 bilaterally and 40 x 7.5mm screws were placed at L4 bilaterally and 6.5 x 40 mm screws were placed at L 3 bilaterally. The right L 3 screw was redirected more medially after reviewing intraoperative X rays.   Final x-rays demonstrated well-positioned pedicle screw fixation. 5.5 x 90  mm lordotic rods were placed and locked down in situ and the posterolateral region was packed with 60 cc of local autograft on the right and the same amount of autograft on the left. A crosslink was placed.  A medium Hemovac drain was placed and anchored with a stitch.  Fascia was closed with 1 Vicryl sutures skin edges   were reapproximated 2 and 3-0 Vicryl sutures. The wound is dressed with Dermabond and an occlusive dressing. The patient was extubated in the operating room and taken to recovery in stable satisfactory condition having tolerated the operation well. Counts were correct at the end of the case.    PLAN OF CARE: Admit to inpatient   PATIENT DISPOSITION:  PACU - hemodynamically stable.   Delay start of Pharmacological VTE agent (>24hrs) due to surgical blood loss or risk of bleeding: yes  

## 2018-02-15 NOTE — Interval H&P Note (Signed)
History and Physical Interval Note:  02/15/2018 9:34 AM  Gary Thomas  has presented today for surgery, with the diagnosis of Lumbar stenosis with neurogenic claudication  The various methods of treatment have been discussed with the patient and family. After consideration of risks, benefits and other options for treatment, the patient has consented to  Procedure(s) with comments: L3-4 L4-5 L5-S1 Posterior lumbar interbody fusion (N/A) - L3-4 L4-5 L5-S1 Posterior lumbar interbody fusion as a surgical intervention .  The patient's history has been reviewed, patient examined, no change in status, stable for surgery.  I have reviewed the patient's chart and labs.  Questions were answered to the patient's satisfaction.     Delainie Chavana D

## 2018-02-15 NOTE — Anesthesia Procedure Notes (Signed)
Procedure Name: Intubation Date/Time: 02/15/2018 11:44 AM Performed by: Julieta Bellini, CRNA Pre-anesthesia Checklist: Patient identified, Emergency Drugs available, Suction available and Patient being monitored Patient Re-evaluated:Patient Re-evaluated prior to induction Oxygen Delivery Method: Circle system utilized Preoxygenation: Pre-oxygenation with 100% oxygen Induction Type: IV induction Ventilation: Mask ventilation without difficulty and Oral airway inserted - appropriate to patient size Laryngoscope Size: Mac and 4 Grade View: Grade I Tube type: Oral Tube size: 7.5 mm Number of attempts: 1 Airway Equipment and Method: Stylet Placement Confirmation: ETT inserted through vocal cords under direct vision,  positive ETCO2 and breath sounds checked- equal and bilateral Secured at: 23 cm Tube secured with: Tape Dental Injury: Teeth and Oropharynx as per pre-operative assessment

## 2018-02-15 NOTE — Brief Op Note (Signed)
02/15/2018  4:50 PM  PATIENT:  Gary Thomas  84 y.o. male  PRE-OPERATIVE DIAGNOSIS:  Lumbar stenosis with neurogenic claudication, lumbar spondylolisthesis, herniated lumbar disc, radiculopathy, lumbago L 3 - S 1 levels  POST-OPERATIVE DIAGNOSIS:  Lumbar stenosis with neurogenic claudication, lumbar spondylolisthesis, herniated lumbar disc, radiculopathy, lumbago L 3 - S 1 levels  PROCEDURE:  Procedure(s): Lumbar Three-Four,Lumbar Four-Five, Lumbar Five-Sacral One Posterior lumbar  fusion. (N/A) decompression, pedicle crew fixation and posterolateral arthrodesis with local autograft  SURGEON:  Surgeon(s) and Role:    Maeola Harman, MD - Primary    Lisbeth Renshaw, MD - Assisting  PHYSICIAN ASSISTANT:   ASSISTANTS: Poteat, RN   ANESTHESIA:   general  EBL:  650 mL   BLOOD ADMINISTERED:300 CC CELLSAVER  DRAINS: (Medium) Hemovact drain(s) in the epidural space with  Suction Open   LOCAL MEDICATIONS USED:  MARCAINE    and LIDOCAINE   SPECIMEN:  No Specimen  DISPOSITION OF SPECIMEN:  N/A  COUNTS:  YES  TOURNIQUET:  * No tourniquets in log *  DICTATION: DICTATION: Patient is 84 year old man with  HNP, spondylosis, prior laminectomy at L 5 S 1 on the left, with spondylolisthesis,  stenosis, DDD, radiculopathy L 34, L 45, L 5 S 1 levels. He has a severe left leg pain and weakness. It was elected to take him to surgery for decompression and fusion at L 34, L 45,  L 5 S 1  levels.    Procedure: Patient was placed in a prone position on the Crawford table after smooth and uncomplicated induction of general endotracheal anesthesia. His low back was prepped and draped in usual sterile fashion with betadine scrub and DuraPrep after marking correct levels with C -arm. Area of incision was infiltrated with local lidocaine. Incision was made to the lumbodorsal fascia was incised and exposure was performed of the L 34, L 45, L 5 S 1 spinous processes laminae facet joint and transverse  processes. Intraoperative x-ray was obtained which confirmed correct orientation. A total laminectomy of L 3, L4 and L5 was performed with disarticulation of the facet joints at this level and thorough decompression was performed of both L 3, L4, L5 and S1 nerve roots along with the common dural tube. There was densely adherent spondylytic material compressing the thecal sac and both L 3,  L 4,  L 5 and S 1 nerve roots.   After a thorough decompression, the L 3, L 4, L 5, S 1 nerve roots and common dural tube were all decompressed.   The posterolateral region was extensively decorticated and pedicle probes were placed at L 3,  L 4, L 5 and S1 bilaterally. Intraoperative fluoroscopy confirmed correct orientationin the AP and lateral plane. 40 x 7.5 mm pedicle screws were placed at S1 bilaterally and 45 x 7.5 mm screws placed at L5 bilaterally and 40 x 7.52mm screws were placed at L4 bilaterally and 6.5 x 40 mm screws were placed at L 3 bilaterally. The right L 3 screw was redirected more medially after reviewing intraoperative X rays.   Final x-rays demonstrated well-positioned pedicle screw fixation. 5.5 x 90  mm lordotic rods were placed and locked down in situ and the posterolateral region was packed with 60 cc of local autograft on the right and the same amount of autograft on the left. A crosslink was placed.  A medium Hemovac drain was placed and anchored with a stitch.  Fascia was closed with 1 Vicryl sutures skin edges  were reapproximated 2 and 3-0 Vicryl sutures. The wound is dressed with Dermabond and an occlusive dressing. The patient was extubated in the operating room and taken to recovery in stable satisfactory condition having tolerated the operation well. Counts were correct at the end of the case.    PLAN OF CARE: Admit to inpatient   PATIENT DISPOSITION:  PACU - hemodynamically stable.   Delay start of Pharmacological VTE agent (>24hrs) due to surgical blood loss or risk of bleeding: yes

## 2018-02-15 NOTE — Progress Notes (Signed)
Patient is awake, alert, conversant.  MAEW with full power.  Doing well.

## 2018-02-15 NOTE — Anesthesia Postprocedure Evaluation (Signed)
Anesthesia Post Note  Patient: Keyron R Burdell  Procedure(s) Performed: Lumbar Three-Four,Lumbar Four-Five, Lumbar Five-Sacral One Posterior lumbar  fusion. (N/A Spine Lumbar)     Patient location during evaluation: PACU Anesthesia Type: General Level of consciousness: awake and alert Pain management: pain level controlled Vital Signs Assessment: post-procedure vital signs reviewed and stable Respiratory status: spontaneous breathing, nonlabored ventilation, respiratory function stable and patient connected to nasal cannula oxygen Cardiovascular status: blood pressure returned to baseline and stable Postop Assessment: no apparent nausea or vomiting Anesthetic complications: no    Last Vitals:  Vitals:   02/15/18 1815 02/15/18 1845  BP: (!) 110/52 105/60  Pulse: 79 80  Resp: 17 14  Temp:    SpO2: 96% 98%    Last Pain:  Vitals:   02/15/18 1815  TempSrc:   PainSc: Asleep                 Trevor Iha

## 2018-02-15 NOTE — Transfer of Care (Signed)
Immediate Anesthesia Transfer of Care Note  Patient: Gary Thomas  Procedure(s) Performed: Lumbar Three-Four,Lumbar Four-Five, Lumbar Five-Sacral One Posterior lumbar  fusion. (N/A Spine Lumbar)  Patient Location: PACU  Anesthesia Type:General  Level of Consciousness: awake, alert  and oriented  Airway & Oxygen Therapy: Patient Spontanous Breathing and Patient connected to nasal cannula oxygen  Post-op Assessment: Report given to RN and Post -op Vital signs reviewed and stable  Post vital signs: Reviewed and stable  Last Vitals:  Vitals Value Taken Time  BP 156/138 02/15/2018  4:28 PM  Temp    Pulse 75 02/15/2018  4:32 PM  Resp 22 02/15/2018  4:32 PM  SpO2 98 % 02/15/2018  4:32 PM  Vitals shown include unvalidated device data.  Last Pain:  Vitals:   02/15/18 1014  TempSrc:   PainSc: 4       Patients Stated Pain Goal: 2 (02/15/18 1014)  Complications: No apparent anesthesia complications

## 2018-02-16 ENCOUNTER — Other Ambulatory Visit: Payer: Self-pay

## 2018-02-16 LAB — POCT I-STAT EG7
ACID-BASE DEFICIT: 5 mmol/L — AB (ref 0.0–2.0)
Bicarbonate: 22.5 mmol/L (ref 20.0–28.0)
CALCIUM ION: 1.07 mmol/L — AB (ref 1.15–1.40)
HCT: 26 % — ABNORMAL LOW (ref 39.0–52.0)
HEMOGLOBIN: 8.8 g/dL — AB (ref 13.0–17.0)
O2 SAT: 47 %
PH VEN: 7.248 — AB (ref 7.250–7.430)
PO2 VEN: 30 mmHg — AB (ref 32.0–45.0)
POTASSIUM: 4.4 mmol/L (ref 3.5–5.1)
Sodium: 145 mmol/L (ref 135–145)
TCO2: 24 mmol/L (ref 22–32)
pCO2, Ven: 51.6 mmHg (ref 44.0–60.0)

## 2018-02-16 LAB — GLUCOSE, CAPILLARY
Glucose-Capillary: 184 mg/dL — ABNORMAL HIGH (ref 65–99)
Glucose-Capillary: 149 mg/dL — ABNORMAL HIGH (ref 65–99)
Glucose-Capillary: 120 mg/dL — ABNORMAL HIGH (ref 65–99)
Glucose-Capillary: 104 mg/dL — ABNORMAL HIGH (ref 65–99)

## 2018-02-16 MED ORDER — FAMOTIDINE 20 MG PO TABS
20.0000 mg | ORAL_TABLET | Freq: Two times a day (BID) | ORAL | Status: DC
  Administered 2018-02-16 – 2018-02-21 (×9): 20 mg via ORAL
  Filled 2018-02-16 (×10): qty 1

## 2018-02-16 MED ORDER — PROMETHAZINE HCL 25 MG PO TABS
12.5000 mg | ORAL_TABLET | Freq: Four times a day (QID) | ORAL | Status: DC | PRN
  Administered 2018-02-16: 25 mg via ORAL
  Administered 2018-02-17: 12.5 mg via ORAL
  Filled 2018-02-16 (×2): qty 1

## 2018-02-16 MED ORDER — PROSIGHT PO TABS
1.0000 | ORAL_TABLET | Freq: Every day | ORAL | Status: DC
  Administered 2018-02-16 – 2018-02-20 (×5): 1 via ORAL
  Filled 2018-02-16 (×5): qty 1

## 2018-02-16 MED FILL — Thrombin For Soln 5000 Unit: CUTANEOUS | Qty: 5000 | Status: AC

## 2018-02-16 MED FILL — Gelatin Absorbable MT Powder: OROMUCOSAL | Qty: 1 | Status: AC

## 2018-02-16 NOTE — Progress Notes (Signed)
Occupational Therapy Evaluation Patient Details Name: Gary Thomas MRN: [id number] DOB: May 05, 1932 Today's Date: 02/16/2018    History of Present Illness 84 year old man with  HNP, spondylosis, prior laminectomy at L 5 S 1 on the left, with spondylolisthesis,  stenosis, DDD, radiculopathy L 34, L 45, L 5 S 1 levels. Underwent Lumbar Three-Four,Lumbar Four-Five, Lumbar Five-Sacral One Posterior lumbar  fusion. (N/A) decompression, pedicle crew fixation and posterolateral arthrodesis with local autograft   Clinical Impression   PTA, pt required occasional assistance with mobility and ADL @ RW level and experienced multiple falls. Eval limited as pt complained of n/v once sitting EOB. Attempted to take BP in sitting, however, pt returned to supine. BP supine 96/72. Pt apparently confused at times thinking his BP cuff was his urostomy tubing and became upset when therapist was adjusting cuff. At this time recommend rehab at SNF prior to return home due to below listed deficits. Pt would like to go to [facility] in [place] if available. Will follow acutely to facilitate DC to next venue of care.     Follow Up Recommendations  SNF;Supervision/Assistance - 24 hour    Equipment Recommendations  None recommended by OT    Recommendations for Other Services       Precautions / Restrictions Precautions Precautions: Fall;Back Precaution Booklet Issued: Yes (comment) Required Braces or Orthoses: Spinal Brace Spinal Brace: Lumbar corset;Applied in sitting position(May remove for shower; no need for night time trips to bathroom)      Mobility Bed Mobility Overal bed mobility: Needs Assistance Bed Mobility: Rolling;Sidelying to Sit;Sit to Sidelying Rolling: Supervision Sidelying to sit: Min guard(with use of side rails)     Sit to sidelying: Mod assist(to lift legs nd maintain safe technique) General bed mobility comments: vc to not twist during bed mobility   When moving from sidelying to  sit, pt pulled siderail up and used rail to sit up.Pt would not allow therapist to lower rail, but exited at end of bed.   Transfers                 General transfer comment: unable to attempt due to pt complaints of n/v and lying back downon his side    Balance Overall balance assessment: History of Falls                                         ADL either performed or assessed with clinical judgement   ADL Overall ADL's : Needs assistance/impaired     Grooming: Set up;Supervision/safety;Sitting   Upper Body Bathing: Minimal assistance;Sitting   Lower Body Bathing: Maximal assistance;Sit to/from stand   Upper Body Dressing : Minimal assistance;Sitting   Lower Body Dressing: Maximal assistance;Sit to/from stand                 General ADL Comments: pt unable to cross feet over knees  Began education on back precautions for ADL     Vision Baseline Vision/History: Wears glasses       Perception     Praxis      Pertinent Vitals/Pain Pain Assessment: 0-10 Pain Score: 6  Pain Location: back Pain Descriptors / Indicators: Aching;Discomfort Pain Intervention(s): Limited activity within patient's tolerance;Patient requesting pain meds-RN notified     Hand Dominance Right   Extremity/Trunk Assessment Upper Extremity Assessment Upper Extremity Assessment: Generalized weakness   Lower Extremity Assessment Lower Extremity Assessment: Defer  to PT evaluation(hx of peripheral neuropathy)   Cervical / Trunk Assessment Cervical / Trunk Assessment: Other exceptions Cervical / Trunk Exceptions: back surgery   Communication Communication Communication: HOH   Cognition Arousal/Alertness: Awake/alert Behavior During Therapy: Impulsive;Restless Overall Cognitive Status: Impaired/Different from baseline Area of Impairment: Attention;Following commands;Safety/judgement;Awareness;Problem solving                   Current Attention Level:  Selective   Following Commands: Follows one step commands with increased time Safety/Judgement: Decreased awareness of safety;Decreased awareness of deficits Awareness: Emergent Problem Solving: Slow processing General Comments: Multiple cues for adhering to back precautions; apparently confused at times thinnking his BP cuff was his urostomy bag   General Comments       Exercises     Shoulder Instructions      Home Living Family/patient expects to be discharged to:: Private residence Living Arrangements: Spouse/significant other Available Help at Discharge: Family;Available 24 hours/day Type of Home: House Home Access: Stairs to enter [employer] of Steps: 3 Entrance Stairs-Rails: Left Home Layout: One level     Bathroom Shower/Tub: Producer, television/film/video: Standard Bathroom Accessibility: Yes How Accessible: Accessible via walker Home Equipment: Walker - 2 wheels;Cane - single point;Shower seat          Prior Functioning/Environment Level of Independence: Independent with assistive device(s);Needs assistance  Gait / Transfers Assistance Needed: reports multiple falls; uses cane or RW ADL's / Homemaking Assistance Needed: wife assists with bathing back            OT Problem List: Decreased strength;Decreased activity tolerance;Impaired balance (sitting and/or standing);Decreased cognition;Decreased safety awareness;Decreased knowledge of use of DME or AE;Decreased knowledge of precautions;Pain      OT Treatment/Interventions: Self-care/ADL training;Therapeutic exercise;DME and/or AE instruction;Therapeutic activities;Cognitive remediation/compensation;Patient/family education;Balance training    OT Goals(Current goals can be found in the care plan section) Acute Rehab OT Goals Patient Stated Goal: to get better OT Goal Formulation: With patient/family Time For Goal Achievement: 03/02/18 Potential to Achieve Goals: Good  OT Frequency: Min  2X/week   Barriers to D/C:            Co-evaluation              AM-PAC PT "6 Clicks" Daily Activity     Outcome Measure Help from another person eating meals?: None Help from another person taking care of personal grooming?: A Little Help from another person toileting, which includes using toliet, bedpan, or urinal?: A Lot Help from another person bathing (including washing, rinsing, drying)?: A Lot Help from another person to put on and taking off regular upper body clothing?: A Little Help from another person to put on and taking off regular lower body clothing?: A Lot 6 Click Score: 16   End of Session Nurse Communication: Mobility status;Patient requests pain meds;Other (comment)(BP n/v)  Activity Tolerance: Treatment limited secondary to medical complications (Comment)(n/v; BP) Patient left: in bed;with call bell/phone within reach;with bed alarm set;with family/visitor present  OT Visit Diagnosis: Other abnormalities of gait and mobility (R26.89);Muscle weakness (generalized) (M62.81);History of falling (Z91.81);Other symptoms and signs involving cognitive function;Pain Pain - part of body: (back)                Time: 4696-2952 OT Time Calculation (min): 30 min Charges:  OT General Charges $OT Visit: 1 Visit OT Evaluation $OT Eval Moderate Complexity: 1 Mod G-Codes:     Karis Emig, OT/L  [phone] 02/16/2018  Ariv Penrod,HILLARY 02/16/2018, 12:27 PM

## 2018-02-16 NOTE — Progress Notes (Signed)
Spoke to dr Lovell Sheehan about patient change in orientation, he recieved dilaudid around 4 and went in to re-assess patient seemed confused about where he was and his birthday but it also seemed like the patient was joking around. Patient also hasnt eaten today, refusing insulin but blood sugars are stable. No new orders, says to bring it up with dr Venetia Maxon in the morning.

## 2018-02-16 NOTE — Progress Notes (Signed)
SPOKE TO BRIAN MD OVER THE PHONE ABOUT NAUSEA AND VOMITTING INCREASING, MD SAYS HE WILL PLACE ORDERS SOON

## 2018-02-16 NOTE — Progress Notes (Addendum)
Subjective: Patient reports "...had a rough night. I feel pretty lousy right now"  Objective: Vital signs in last 24 hours: Temp:  [97.3 F (36.3 C)-97.9 F (36.6 C)] 97.9 F (36.6 C) (05/08 0745) Pulse Rate:  [65-89] 68 (05/08 0745) Resp:  [11-20] 18 (05/08 0745) BP: (89-184)/(50-138) 106/50 (05/08 0745) SpO2:  [91 %-99 %] 91 % (05/08 0745) Weight:  [98.9 kg (218 lb)] 98.9 kg (218 lb) (05/07 1014)  Intake/Output from previous day: 05/07 0701 - 05/08 0700 In: 3885 [P.O.:510; I.V.:2325; Blood:300; IV Piggyback:400] Out: 1325 [Urine:350; Drains:325; Blood:650] Intake/Output this shift: Total I/O In: -  Out: 100 [Drains:100]  Alert, conversant. Reports N/V overnight (dry heaves at present). Some back pain overnight, but no leg pain. Incision without erythema, swelling or drainage beneath honeycomb and Dermabond. Hemovac ~176ml overnight. Good strength BLE. Not yet OOB.   Lab Results: No results for input(s): WBC, HGB, HCT, PLT in the last 72 hours. BMET No results for input(s): NA, K, CL, CO2, GLUCOSE, BUN, CREATININE, CALCIUM in the last 72 hours.  Studies/Results: Dg Lumbar Spine 2-3 Views  Result Date: 02/15/2018 CLINICAL DATA:  Portable operative imaging for posterior lumbar spine fusion. EXAM: LUMBAR SPINE - 2-3 VIEW; DG C-ARM 61-120 MIN COMPARISON:  12/13/2017 FINDINGS: Imaging shows placement bilateral pedicle screws at L3, L4, L5 and S1. The orthopedic hardware appears well-seated and well-positioned. No evidence of a complication. IMPRESSION: Portable operative imaging for posterior lumbar spine fusion from L3 through S1. Electronically Signed   By: Amie Portland M.D.   On: 02/15/2018 16:35   Dg C-arm 1-60 Min  Result Date: 02/15/2018 CLINICAL DATA:  Portable operative imaging for posterior lumbar spine fusion. EXAM: LUMBAR SPINE - 2-3 VIEW; DG C-ARM 61-120 MIN COMPARISON:  12/13/2017 FINDINGS: Imaging shows placement bilateral pedicle screws at L3, L4, L5 and S1. The  orthopedic hardware appears well-seated and well-positioned. No evidence of a complication. IMPRESSION: Portable operative imaging for posterior lumbar spine fusion from L3 through S1. Electronically Signed   By: Amie Portland M.D.   On: 02/15/2018 16:35   Dg C-arm 1-60 Min  Result Date: 02/15/2018 CLINICAL DATA:  Portable operative imaging for posterior lumbar spine fusion. EXAM: LUMBAR SPINE - 2-3 VIEW; DG C-ARM 61-120 MIN COMPARISON:  12/13/2017 FINDINGS: Imaging shows placement bilateral pedicle screws at L3, L4, L5 and S1. The orthopedic hardware appears well-seated and well-positioned. No evidence of a complication. IMPRESSION: Portable operative imaging for posterior lumbar spine fusion from L3 through S1. Electronically Signed   By: Amie Portland M.D.   On: 02/15/2018 16:35    Assessment/Plan:   LOS: 1 day  Will treat nausea and vomiting and mobilize in brace today, advancing diet as tolerated.   Georgiann Cocker 02/16/2018, 7:50 AM   Mobilize with PT today.

## 2018-02-16 NOTE — Care Management Note (Signed)
Case Management Note  Patient Details  Name: Gary Thomas MRN: [id number] Date of Birth: 10/14/1931  Subjective/Objective:    Lumbar Fusion L3-L5                Action/Plan: NCM spoke to pt at bedside. States he requesting SNF for rehab. Lives at home with wife. Does not have any DME at home. States he was independent prior to hospital stay. CSW referral for SNF. States he is requesting [facility]. He was at [facility] in the past and did not like that facility.   Expected Discharge Date:                  Expected Discharge Plan:  Skilled Nursing Facility  In-House Referral:  Clinical Social Work  Discharge planning Services  CM Consult  Post Acute Care Choice:  NA Choice offered to:  NA  DME Arranged:  N/A DME Agency:  NA  HH Arranged:  NA HH Agency:  NA  Status of Service:  Completed, signed off  If discussed at Long Length of Stay Meetings, dates discussed:    Additional Comments:  Elliot Cousin, RN 02/16/2018, 1:51 PM

## 2018-02-16 NOTE — NC FL2 (Signed)
[place] MEDICAID FL2 LEVEL OF CARE SCREENING TOOL     IDENTIFICATION  Patient Name: Gary Thomas Birthdate: 08-04-1932 Sex: male Admission Date (Current Location): 02/15/2018  [facility] and [place] Number:  Chiropodist and Address:  [facility] [place], [address], [place], [place] [address]      Provider Number: [id number]  Attending Physician Name and Address:  Maeola Harman, MD  Relative Name and Phone Number:       Current Level of Care: Hospital Recommended Level of Care: Skilled Nursing Facility Prior Approval Number:    Date Approved/Denied: 02/16/18 PASRR Number: [id number]  Discharge Plan: SNF    Current Diagnoses: Patient Active Problem List   Diagnosis Date Noted  . Spondylolisthesis of lumbar region 02/15/2018  . PAD (peripheral artery disease) (HCC) 08/19/2017  . Hyperlipidemia 07/05/2017  . Essential hypertension 07/05/2017  . Bilateral carotid artery stenosis 07/05/2017  . Primary localized osteoarthritis of right hip 03/02/2017    Orientation RESPIRATION BLADDER Height & Weight     Self, Time, Situation, Place  Normal Continent Weight: 218 lb (98.9 kg) Height:   (175.3 cm)  BEHAVIORAL SYMPTOMS/MOOD NEUROLOGICAL BOWEL NUTRITION STATUS      Continent Diet(see discharge summary)  AMBULATORY STATUS COMMUNICATION OF NEEDS Skin   Extensive Assist Verbally Surgical wounds(incision on back with honeycomb dressing; hemovac accordion on back)                       Personal Care Assistance Level of Assistance  Bathing, Feeding, Dressing Bathing Assistance: Maximum assistance Feeding assistance: Independent Dressing Assistance: Maximum assistance     Functional Limitations Info  Sight, Hearing, Speech Sight Info: Adequate Hearing Info: Adequate Speech Info: Adequate    SPECIAL CARE FACTORS FREQUENCY  PT (By licensed PT), OT (By licensed OT)     PT Frequency: 5x week OT Frequency: 5x week             Contractures Contractures Info: Not present    Additional Factors Info  Code Status, Allergies, Psychotropic Code Status Info: Full Code Allergies Info: CODEINE, MORPHINE, GABAPENTIN, STADOL BUTORPHANOL, LYRICA PREGABALIN, TAPENTADOL  Psychotropic Info: sertraline (ZOLOFT) tablet 100 mg PO at bedtime         Current Medications (02/16/2018):  This is the current hospital active medication list Current Facility-Administered Medications  Medication Dose Route Frequency Provider Last Rate Last Dose  . 0.9 %  sodium chloride infusion  250 mL Intravenous Continuous Maeola Harman, MD      . acetaminophen (TYLENOL) tablet 650 mg  650 mg Oral Q4H PRN Maeola Harman, MD   650 mg at 02/16/18 0045   Or  . acetaminophen (TYLENOL) suppository 650 mg  650 mg Rectal Q4H PRN Maeola Harman, MD      . alum & mag hydroxide-simeth (MAALOX/MYLANTA) 200-200-20 MG/5ML suspension 30 mL  30 mL Oral Q6H PRN Maeola Harman, MD      . atorvastatin (LIPITOR) tablet 20 mg  20 mg Oral QHS Maeola Harman, MD   20 mg at 02/15/18 2052  . bisacodyl (DULCOLAX) suppository 10 mg  10 mg Rectal Daily PRN Maeola Harman, MD      . dextrose 5 % and 0.45 % NaCl with KCl 20 mEq/L infusion   Intravenous Continuous Maeola Harman, MD 75 mL/hr at 02/16/18 0524    . docusate sodium (COLACE) capsule 100 mg  100 mg Oral BID Maeola Harman, MD   100 mg at 02/16/18 5621  .  famotidine (PEPCID) IVPB 20 mg premix  20 mg Intravenous Q12H Maeola Harman, MD   Stopped at 02/16/18 1259  . furosemide (LASIX) tablet 40 mg  40 mg Oral Daily Maeola Harman, MD   40 mg at 02/16/18 1610  . HYDROcodone-acetaminophen (NORCO/VICODIN) 5-325 MG per tablet 1 tablet  1 tablet Oral Q4H PRN Maeola Harman, MD   1 tablet at 02/16/18 [phone]  . HYDROcodone-acetaminophen (NORCO/VICODIN) 5-325 MG per tablet 2 tablet  2 tablet Oral Q4H PRN Maeola Harman, MD   2 tablet at 02/16/18 1300  . HYDROmorphone (DILAUDID) injection 1 mg  1 mg Intravenous Q2H PRN Maeola Harman, MD   1  mg at 02/16/18 1511  . insulin aspart (novoLOG) injection 0-15 Units  0-15 Units Subcutaneous TID WC Maeola Harman, MD   3 Units at 02/16/18 [phone]  . insulin aspart (novoLOG) injection 0-5 Units  0-5 Units Subcutaneous QHS Maeola Harman, MD      . insulin aspart (novoLOG) injection 4 Units  4 Units Subcutaneous TID WC Maeola Harman, MD      . insulin aspart protamine- aspart (NOVOLOG MIX 70/30) injection 25 Units  25 Units Subcutaneous BID WC Maeola Harman, MD   25 Units at 02/16/18 [phone]  . losartan (COZAAR) tablet 25 mg  25 mg Oral QHS Maeola Harman, MD   25 mg at 02/15/18 2051  . menthol-cetylpyridinium (CEPACOL) lozenge 3 mg  1 lozenge Oral PRN Maeola Harman, MD       Or  . phenol [facility]) mouth spray 1 spray  1 spray Mouth/Throat PRN Maeola Harman, MD      . methocarbamol (ROBAXIN) tablet 500 mg  500 mg Oral Q6H PRN Maeola Harman, MD   500 mg at 02/15/18 1715   Or  . methocarbamol (ROBAXIN) 500 mg in dextrose 5 % 50 mL IVPB  500 mg Intravenous Q6H PRN Maeola Harman, MD      . metoprolol succinate (TOPROL-XL) 24 hr tablet 100 mg  100 mg Oral QHS Maeola Harman, MD   100 mg at 02/15/18 2052  . multivitamin (PROSIGHT) tablet 1 tablet  1 tablet Oral QHS Maeola Harman, MD      . ondansetron [facility]) tablet 4 mg  4 mg Oral Q6H PRN Maeola Harman, MD       Or  . ondansetron [facility]) injection 4 mg  4 mg Intravenous Q6H PRN Maeola Harman, MD   4 mg at 02/16/18 [phone]  . polyethylene glycol (MIRALAX / GLYCOLAX) packet 17 g  17 g Oral Daily PRN Maeola Harman, MD      . promethazine (PHENERGAN) tablet 12.5-25 mg  12.5-25 mg Oral Q6H PRN Maeola Harman, MD   25 mg at 02/16/18 1229  . sertraline (ZOLOFT) tablet 100 mg  100 mg Oral QHS Maeola Harman, MD   100 mg at 02/15/18 2052  . sodium chloride flush (NS) 0.9 % injection 3 mL  3 mL Intravenous Q12H Maeola Harman, MD   3 mL at 02/16/18 0932  . sodium chloride flush (NS) 0.9 % injection 3 mL  3 mL Intravenous PRN Maeola Harman, MD      . sodium  phosphate (FLEET) 7-19 GM/118ML enema 1 enema  1 enema Rectal Once PRN Maeola Harman, MD      . zolpidem [facility]) tablet 5 mg  5 mg Oral QHS PRN Maeola Harman, MD         Discharge Medications: Please see discharge summary for a list of discharge medications.  Relevant Imaging Results:  Relevant  Lab Results:   Additional Information SS# [id number] [place], [place]

## 2018-02-16 NOTE — Plan of Care (Signed)
  Problem: Education: Goal: Knowledge of General Education information will improve Outcome: Progressing   Problem: Health Behavior/Discharge Planning: Goal: Ability to manage health-related needs will improve Outcome: Progressing   Problem: Clinical Measurements: Goal: Ability to maintain clinical measurements within normal limits will improve Outcome: Progressing Goal: Will remain free from infection Outcome: Progressing Goal: Diagnostic test results will improve Outcome: Progressing Goal: Respiratory complications will improve Outcome: Progressing Goal: Cardiovascular complication will be avoided Outcome: Progressing   Problem: Activity: Goal: Risk for activity intolerance will decrease Outcome: Progressing   Problem: Nutrition: Goal: Adequate nutrition will be maintained Outcome: Progressing   Problem: Coping: Goal: Level of anxiety will decrease Outcome: Progressing   Problem: Elimination: Goal: Will not experience complications related to bowel motility Outcome: Progressing Goal: Will not experience complications related to urinary retention Outcome: Progressing   Problem: Pain Managment: Goal: General experience of comfort will improve Outcome: Progressing   Problem: Safety: Goal: Ability to remain free from injury will improve Outcome: Progressing   Problem: Skin Integrity: Goal: Risk for impaired skin integrity will decrease Outcome: Progressing   Problem: Education: Goal: Knowledge of disease or condition will improve Outcome: Progressing Goal: Knowledge of secondary prevention will improve Outcome: Progressing Goal: Knowledge of patient specific risk factors addressed and post discharge goals established will improve Outcome: Progressing   

## 2018-02-16 NOTE — Progress Notes (Signed)
Patient ID: Gary Thomas, male   DOB: [birth year], 84 y.o.   MRN: [id number] Ok per DrStern for Phenergan 12.5-25mg  po q6hrs for nausea. Order entered.

## 2018-02-16 NOTE — Progress Notes (Signed)
Paged Venetia Maxon MD odffice at 1704 for change in patient orientation. No call back yet

## 2018-02-16 NOTE — Evaluation (Signed)
Physical Therapy Evaluation Patient Details Name: Gary Thomas MRN: [id number] DOB: 06/25/1932 Today's Date: 02/16/2018   History of Present Illness  84 year old man with  HNP, spondylosis, prior laminectomy at L 5 S 1 on the left, with spondylolisthesis,  stenosis, DDD, radiculopathy L 34, L 45, L 5 S 1 levels. Underwent Lumbar Three-Four,Lumbar Four-Five, Lumbar Five-Sacral One Posterior lumbar  fusion. (N/A) decompression, pedicle crew fixation and posterolateral arthrodesis with local autograft  Clinical Impression  PTA pt required occasional assist with mobility with RW and ADLs with hx of falling. Evaluation was limited due to c/o of nausea with sitting EoB and pt return to supine. Pt with varying levels of cognition of situation and often repeating himself. At this time PT recommends SNF level rehab at d/c to address safety and decreased mobility. Pt requests [facility] in [place]. PT will continue to follow acutely until d/c.     Follow Up Recommendations SNF    Equipment Recommendations  Other (comment)(TBD at next venue)    Recommendations for Other Services       Precautions / Restrictions Precautions Precautions: Fall;Back Precaution Booklet Issued: Yes (comment) Required Braces or Orthoses: Spinal Brace Spinal Brace: Lumbar corset;Applied in sitting position(May remove for shower; no need night time trips to bathroom)      Mobility  Bed Mobility Overal bed mobility: Needs Assistance Bed Mobility: Rolling;Sidelying to Sit;Sit to Sidelying Rolling: Supervision Sidelying to sit: Min guard(with use of side rails)     Sit to sidelying: Mod assist(to lift legs and maintain safe technique) General bed mobility comments: vc to not twist during bed mobility  Transfers                 General transfer comment: unable to attempt due to pt complaints of n/v and lying back downon his side      Balance Overall balance assessment: History of Falls                                            Pertinent Vitals/Pain Pain Assessment: 0-10 Pain Score: 6  Pain Location: back Pain Descriptors / Indicators: Aching;Discomfort Pain Intervention(s): Limited activity within patient's tolerance;Monitored during session;Repositioned    Home Living Family/patient expects to be discharged to:: Private residence Living Arrangements: Spouse/significant other Available Help at Discharge: Family;Available 24 hours/day Type of Home: House Home Access: Stairs to enter Entrance Stairs-Rails: Left Entrance Stairs-Number of Steps: 3 Home Layout: One level Home Equipment: Walker - 2 wheels;Cane - single point;Shower seat      Prior Function Level of Independence: Independent with assistive device(s);Needs assistance   Gait / Transfers Assistance Needed: reports multiple falls; uses cane or RW  ADL's / Homemaking Assistance Needed: wife assists with bathing back        Hand Dominance   Dominant Hand: Right    Extremity/Trunk Assessment   Upper Extremity Assessment Upper Extremity Assessment: Defer to OT evaluation    Lower Extremity Assessment Lower Extremity Assessment: RLE deficits/detail;LLE deficits/detail RLE Deficits / Details: unable to fully assess secondary to nausea with EoB  RLE Sensation: history of peripheral neuropathy LLE Deficits / Details: unable to fully assess secondary to nausea with EoB  LLE Sensation: history of peripheral neuropathy    Cervical / Trunk Assessment Cervical / Trunk Assessment: Other exceptions Cervical / Trunk Exceptions: back surgery  Communication   Communication: HOH  Cognition Arousal/Alertness:  Awake/alert Behavior During Therapy: Impulsive;Restless Overall Cognitive Status: Impaired/Different from baseline Area of Impairment: Attention;Following commands;Safety/judgement;Awareness;Problem solving                   Current Attention Level: Selective   Following Commands:  Follows one step commands with increased time Safety/Judgement: Decreased awareness of safety;Decreased awareness of deficits Awareness: Emergent Problem Solving: Slow processing General Comments: Multiple cues for adhering to back precautions; apparently confused at times thinnking his BP cuff was his urostomy bag             Assessment/Plan    PT Assessment Patient needs continued PT services  PT Problem List Decreased strength;Decreased activity tolerance;Decreased mobility;Pain       PT Treatment Interventions DME instruction;Gait training;Stair training;Functional mobility training;Therapeutic activities;Therapeutic exercise;Balance training;Neuromuscular re-education;Cognitive remediation;Patient/family education    PT Goals (Current goals can be found in the Care Plan section)  Acute Rehab PT Goals Patient Stated Goal: to get better PT Goal Formulation: With patient/family Time For Goal Achievement: 03/02/18 Potential to Achieve Goals: Fair    Frequency Min 3X/week   Barriers to discharge        Co-evaluation PT/OT/SLP Co-Evaluation/Treatment: Yes Reason for Co-Treatment: For patient/therapist safety           AM-PAC PT "6 Clicks" Daily Activity  Outcome Measure Difficulty turning over in bed (including adjusting bedclothes, sheets and blankets)?: Unable Difficulty moving from lying on back to sitting on the side of the bed? : Unable Difficulty sitting down on and standing up from a chair with arms (e.g., wheelchair, bedside commode, etc,.)?: Unable Help needed moving to and from a bed to chair (including a wheelchair)?: Total Help needed walking in hospital room?: Total Help needed climbing 3-5 steps with a railing? : Total 6 Click Score: 6    End of Session   Activity Tolerance: Other (comment)(limited by nausea and return to bed) Patient left: in bed;with call bell/phone within reach;with bed alarm set;with family/visitor present Nurse Communication:  Mobility status;Other (comment)(nausea and pain) PT Visit Diagnosis: Pain;Other symptoms and signs involving the nervous system (R29.898) Pain - part of body: (back and bilateral LE )    Time: 1610-9604 PT Time Calculation (min) (ACUTE ONLY): 30 min   Charges:   PT Evaluation $PT Eval Moderate Complexity: 1 Mod     PT G Codes:        Izmael Duross B. Beverely Risen PT, DPT Acute Rehabilitation  [phone] Pager [phone]    Elon Alas Fleet 02/16/2018, 1:05 PM

## 2018-02-17 LAB — BASIC METABOLIC PANEL
Anion gap: 9 (ref 5–15)
BUN: 51 mg/dL — ABNORMAL HIGH (ref 6–20)
CHLORIDE: 104 mmol/L (ref 101–111)
CO2: 22 mmol/L (ref 22–32)
Calcium: 8.2 mg/dL — ABNORMAL LOW (ref 8.9–10.3)
Creatinine, Ser: 4.74 mg/dL — ABNORMAL HIGH (ref 0.61–1.24)
GFR calc Af Amer: 12 mL/min — ABNORMAL LOW (ref >60)
GFR calc non Af Amer: 10 mL/min — ABNORMAL LOW (ref >60)
Glucose, Bld: 102 mg/dL — ABNORMAL HIGH (ref 65–99)
POTASSIUM: 6.1 mmol/L — AB (ref 3.5–5.1)
SODIUM: 135 mmol/L (ref 135–145)

## 2018-02-17 LAB — CBC WITH DIFFERENTIAL/PLATELET
BASOS PCT: 0 %
Basophils Absolute: 0 10*3/uL (ref 0.0–0.1)
EOS ABS: 0 10*3/uL (ref 0.0–0.7)
Eosinophils Relative: 0 %
HCT: 20.6 % — ABNORMAL LOW (ref 39.0–52.0)
HEMOGLOBIN: 6 g/dL — AB (ref 13.0–17.0)
LYMPHS ABS: 1.4 10*3/uL (ref 0.7–4.0)
Lymphocytes Relative: 8 %
MCH: 23.3 pg — ABNORMAL LOW (ref 26.0–34.0)
MCHC: 29.1 g/dL — ABNORMAL LOW (ref 30.0–36.0)
MCV: 80.2 fL (ref 78.0–100.0)
Monocytes Absolute: 1.6 10*3/uL — ABNORMAL HIGH (ref 0.1–1.0)
Monocytes Relative: 10 %
NEUTROS PCT: 82 %
Neutro Abs: 13.5 10*3/uL — ABNORMAL HIGH (ref 1.7–7.7)
Platelets: 126 10*3/uL — ABNORMAL LOW (ref 150–400)
RBC: 2.57 MIL/uL — AB (ref 4.22–5.81)
RDW: 17.3 % — ABNORMAL HIGH (ref 11.5–15.5)
WBC: 16.5 10*3/uL — AB (ref 4.0–10.5)

## 2018-02-17 LAB — GLUCOSE, CAPILLARY
GLUCOSE-CAPILLARY: 127 mg/dL — AB (ref 65–99)
GLUCOSE-CAPILLARY: 107 mg/dL — AB (ref 65–99)
GLUCOSE-CAPILLARY: 94 mg/dL (ref 65–99)
GLUCOSE-CAPILLARY: 105 mg/dL — AB (ref 65–99)
Glucose-Capillary: 73 mg/dL (ref 65–99)

## 2018-02-17 LAB — RENAL FUNCTION PANEL
ALBUMIN: 2.7 g/dL — AB (ref 3.5–5.0)
Anion gap: 7 (ref 5–15)
BUN: 52 mg/dL — ABNORMAL HIGH (ref 6–20)
CHLORIDE: 107 mmol/L (ref 101–111)
CO2: 22 mmol/L (ref 22–32)
CREATININE: 4.26 mg/dL — AB (ref 0.61–1.24)
Calcium: 8.1 mg/dL — ABNORMAL LOW (ref 8.9–10.3)
GFR calc Af Amer: 13 mL/min — ABNORMAL LOW (ref >60)
GFR, EST NON AFRICAN AMERICAN: 12 mL/min — AB (ref >60)
Glucose, Bld: 100 mg/dL — ABNORMAL HIGH (ref 65–99)
PHOSPHORUS: 4.3 mg/dL (ref 2.5–4.6)
Potassium: 5.8 mmol/L — ABNORMAL HIGH (ref 3.5–5.1)
Sodium: 136 mmol/L (ref 135–145)

## 2018-02-17 LAB — PREPARE RBC (CROSSMATCH)

## 2018-02-17 MED ORDER — PATIROMER SORBITEX CALCIUM 8.4 G PO PACK
8.4000 g | PACK | Freq: Every day | ORAL | Status: DC
  Administered 2018-02-17 – 2018-02-18 (×2): 8.4 g via ORAL
  Filled 2018-02-17 (×2): qty 1

## 2018-02-17 MED ORDER — METOPROLOL SUCCINATE ER 25 MG PO TB24
25.0000 mg | ORAL_TABLET | Freq: Every day | ORAL | Status: DC
  Administered 2018-02-17 – 2018-02-20 (×4): 25 mg via ORAL
  Filled 2018-02-17 (×4): qty 1

## 2018-02-17 MED ORDER — SODIUM CHLORIDE 0.9 % IV SOLN: Freq: Once | INTRAVENOUS | Status: DC

## 2018-02-17 MED ORDER — SODIUM CHLORIDE 0.9 % IV BOLUS
500.0000 mL | Freq: Once | INTRAVENOUS | Status: AC
  Administered 2018-02-17: 500 mL via INTRAVENOUS

## 2018-02-17 NOTE — Progress Notes (Addendum)
Physical Therapy Treatment Patient Details Name: Gary Thomas MRN: [id number] DOB: August 09, 1932 Today's Date: 02/17/2018    History of Present Illness 84 year old man with  HNP, spondylosis, prior laminectomy at L 5 S 1 on the left, with spondylolisthesis,  stenosis, DDD, radiculopathy L 34, L 45, L 5 S 1 levels. Underwent Lumbar Three-Four,Lumbar Four-Five, Lumbar Five-Sacral One Posterior lumbar  fusion. (N/A) decompression, pedicle crew fixation and posterolateral arthrodesis with local autograft    PT Comments    Patient able to sit to stand from EOB and recliner and ambulate short distance with +2 assistance and max multimodal cues for sequencing of tasks. Pt able to recall 1/3 back precautions. Pt impulsive and confused at times during session. Pt receiving second unit PRBC during session and RN cleared pt to participate in therapy. BP in sitting end of session 146/111 (not sure of accuracy due to pt moving around so much) and hard to get clear reading of SpO2 (upper 70s to 95% on supplemental O2). Wife and Son present during session. Continue to progress as tolerated.    Follow Up Recommendations  SNF     Equipment Recommendations  Other (comment)(TBD)    Recommendations for Other Services       Precautions / Restrictions Precautions Precautions: Fall;Back Precaution Booklet Issued: No Precaution Comments: Pt able to recall 1/3 precautions, reviewed all with pt Required Braces or Orthoses: Spinal Brace Spinal Brace: Lumbar corset;Applied in sitting position Restrictions Weight Bearing Restrictions: No    Mobility  Bed Mobility Overal bed mobility: Needs Assistance Bed Mobility: Rolling;Sidelying to Sit Rolling: Mod assist Sidelying to sit: Max assist;+2 for physical assistance       General bed mobility comments: max multimodal cues for sequencing and maintain precautions; assist to roll and to elevate trunk into sitting   Transfers Overall transfer level: Needs  assistance Equipment used: Rolling walker (2 wheeled) Transfers: Sit to/from [employer] Sit to Stand: Mod assist;+2 physical assistance Stand pivot transfers: Mod assist;+2 physical assistance       General transfer comment: +2 to power up into standing and facilitation at glutes required for upright posture during transfers; max multimodal cues for sequencing and safe hand placement and use of RW  Ambulation/Gait Ambulation/Gait assistance: Mod assist;+2 safety/equipment Ambulation Distance (Feet): (~95ft X2 with seated rest break) Assistive device: Rolling walker (2 wheeled) Gait Pattern/deviations: Step-through pattern;Decreased step length - right;Decreased step length - left;Decreased dorsiflexion - right;Decreased dorsiflexion - left;Trunk flexed;Wide base of support     General Gait Details: mutlimodal cues for posture and vc for sequencing, safe use of AD, and foot placement; pt sat abruptly for rest break; Pt required assistance for balance, upright posture, and safe use of AD   Stairs             Wheelchair Mobility    Modified Rankin (Stroke Patients Only)       Balance Overall balance assessment: Needs assistance Sitting-balance support: Feet supported;Bilateral upper extremity supported Sitting balance-Leahy Scale: Fair Sitting balance - Comments: Close min guard throughout   Standing balance support: Bilateral upper extremity supported Standing balance-Leahy Scale: Poor                              Cognition Arousal/Alertness: Awake/alert Behavior During Therapy: Restless;Impulsive Overall Cognitive Status: Impaired/Different from baseline Area of Impairment: Attention;Memory;Following commands;Safety/judgement;Awareness;Problem solving  Current Attention Level: Selective Memory: Decreased recall of precautions;Decreased short-term memory Following Commands: Follows one step commands  inconsistently;Follows one step commands with increased time Safety/Judgement: Decreased awareness of safety;Decreased awareness of deficits Awareness: Emergent Problem Solving: Slow processing;Difficulty sequencing;Requires verbal cues;Requires tactile cues General Comments: Significant difficulty sequencing familiar tasks: walking, brushing teeth even with max multimodal cues. Impulsive when sitting down      Exercises      General Comments General comments (skin integrity, edema, etc.): wife and son present throughout session; pt receiving second unit PRBC      Pertinent Vitals/Pain Pain Assessment: Faces Faces Pain Scale: Hurts even more Pain Location: back Pain Descriptors / Indicators: Aching;Grimacing Pain Intervention(s): Limited activity within patient's tolerance;Monitored during session;Repositioned    Home Living                      Prior Function            PT Goals (current goals can now be found in the care plan section) Acute Rehab PT Goals Patient Stated Goal: to get better Progress towards PT goals: Progressing toward goals    Frequency    Min 3X/week      PT Plan Current plan remains appropriate    Co-evaluation PT/OT/SLP Co-Evaluation/Treatment: Yes Reason for Co-Treatment: Necessary to address cognition/behavior during functional activity;For patient/therapist safety;To address functional/ADL transfers PT goals addressed during session: Mobility/safety with mobility OT goals addressed during session: ADL's and self-care      AM-PAC PT "6 Clicks" Daily Activity  Outcome Measure  Difficulty turning over in bed (including adjusting bedclothes, sheets and blankets)?: Unable Difficulty moving from lying on back to sitting on the side of the bed? : Unable Difficulty sitting down on and standing up from a chair with arms (e.g., wheelchair, bedside commode, etc,.)?: Unable Help needed moving to and from a bed to chair (including a  wheelchair)?: A Lot Help needed walking in hospital room?: A Lot Help needed climbing 3-5 steps with a railing? : Total 6 Click Score: 8    End of Session Equipment Utilized During Treatment: Gait belt;Oxygen Activity Tolerance: Patient tolerated treatment well Patient left: in chair;with call bell/phone within reach;with family/visitor present Nurse Communication: Mobility status PT Visit Diagnosis: Pain;Other symptoms and signs involving the nervous system (R29.898) Pain - part of body: (back and bilat LE)     Time: 7829-5621 PT Time Calculation (min) (ACUTE ONLY): 26 min  Charges:  $Gait Training: 8-22 mins                    G Codes:       [name], PTA Pager: [phone]     Carolynne Edouard 02/17/2018, 3:28 PM

## 2018-02-17 NOTE — Progress Notes (Addendum)
Subjective: Patient reports "I like the fish" "It's (back) sore"  Objective: Vital signs in last 24 hours: Temp:  [97.6 F (36.4 C)-98.2 F (36.8 C)] 98.2 F (36.8 C) (05/09 0107) Pulse Rate:  [68-87] 73 (05/09 0426) Resp:  [18-22] 22 (05/09 0426) BP: (82-121)/(42-95) 103/51 (05/09 0426) SpO2:  [83 %-97 %] 94 % (05/09 0426) FiO2 (%):  [2 %] 2 % (05/08 1542)  Intake/Output from previous day: 05/08 0701 - 05/09 0700 In: 650 [P.O.:200; I.V.:450] Out: 460 [Urine:150; Drains:310] Intake/Output this shift: Total I/O In: -  Out: 150 [Drains:150]  Awake, but confused. MAEW. Winces and states "It's sore" when turning to examine back. Incision flat without erythema, swelling, or drainage beneath honeycomb and Dermabond. Hemovac ~161ml overnight.  Unable to participate with PT yesterday. Last pain med Dilaudid after midnight. No other pain med or muscle relaxer overnight. More alert this am per Nursing, but confusion persists. BP's soft.   Lab Results: Recent Labs    02/15/18 1447  HGB 8.8*  HCT 26.0*   BMET Recent Labs    02/15/18 1447  NA 145  K 4.4    Studies/Results: Dg Lumbar Spine 2-3 Views  Result Date: 02/15/2018 CLINICAL DATA:  Portable operative imaging for posterior lumbar spine fusion. EXAM: LUMBAR SPINE - 2-3 VIEW; DG C-ARM 61-120 MIN COMPARISON:  12/13/2017 FINDINGS: Imaging shows placement bilateral pedicle screws at L3, L4, L5 and S1. The orthopedic hardware appears well-seated and well-positioned. No evidence of a complication. IMPRESSION: Portable operative imaging for posterior lumbar spine fusion from L3 through S1. Electronically Signed   By: Amie Portland M.D.   On: 02/15/2018 16:35   Dg C-arm 1-60 Min  Result Date: 02/15/2018 CLINICAL DATA:  Portable operative imaging for posterior lumbar spine fusion. EXAM: LUMBAR SPINE - 2-3 VIEW; DG C-ARM 61-120 MIN COMPARISON:  12/13/2017 FINDINGS: Imaging shows placement bilateral pedicle screws at L3, L4, L5 and S1. The  orthopedic hardware appears well-seated and well-positioned. No evidence of a complication. IMPRESSION: Portable operative imaging for posterior lumbar spine fusion from L3 through S1. Electronically Signed   By: Amie Portland M.D.   On: 02/15/2018 16:35   Dg C-arm 1-60 Min  Result Date: 02/15/2018 CLINICAL DATA:  Portable operative imaging for posterior lumbar spine fusion. EXAM: LUMBAR SPINE - 2-3 VIEW; DG C-ARM 61-120 MIN COMPARISON:  12/13/2017 FINDINGS: Imaging shows placement bilateral pedicle screws at L3, L4, L5 and S1. The orthopedic hardware appears well-seated and well-positioned. No evidence of a complication. IMPRESSION: Portable operative imaging for posterior lumbar spine fusion from L3 through S1. Electronically Signed   By: Amie Portland M.D.   On: 02/15/2018 16:35    Assessment/Plan:   LOS: 2 days  Per Dr. Venetia Maxon, BMP, CBC, NS fluid bolus. Holding pain meds except Tylenol for now. Mobilize as tolerated. D/C Hemovac.   PoteatArlys John 02/17/2018, 7:15 AM   Hgb 6.0.  Will transfuse 2 units PRBC's.  Transfusion ordered for acute blood loss anemia.  Patient initially with GFR 35 c/w CKD Grade 3.  Will consult with Renal service for bump in Creatinine postop.

## 2018-02-17 NOTE — Progress Notes (Signed)
Patient ID: Gary Thomas, male   DOB: [birth year], 84 y.o.   MRN: [id number] Spoke with pt's nurse who reports improved color, some persistent confusion, but wife present reports little change from prior surgeries. Receiving second unit of blood now.  Dr. Venetia Maxon requests nephrology consult, as pt has only one kidney and Creat 4.74 and BUN 51.  Dr. Kathrene Bongo paged and agrees to visit.

## 2018-02-17 NOTE — Progress Notes (Signed)
Left message with answering service for neurosurgery regarding patient's critical lab of Hemoglobin 6.0. Lawson Radar

## 2018-02-17 NOTE — Consult Note (Signed)
[facility] Renal Consultation Note  Requesting MD: Vertell Limber Indication for Consultation: AKI with hyperkalemia after surgery   HPI:  Gary Thomas is a 84 y.o. male with past medical history significant for diabetes mellitus, hypertension on an ARB as well as only having a solitary functioning kidney with urostomy - followed by Dr. Candiss Norse in [place]. Review of the data indicates a baseline creatinine in the mid to high ones since 2014- preop labs done on 4/29 with creatinine 1.72. He underwent a lumbar spine decompression with arthrodesis on 5/7 per Dr. Vertell Limber.  His postoperative course has been complicated by confusion and anemia as well as some soft blood pressures- as low as 89 systolic. Labs had not been checked since 4/29. Today creatinine was noted to be 4.74 with a potassium of 6.1.  His regular outpatient dose of Cozaar was given on 5/7 and 5/8- and of course he was on prior to admission. He was also given relatively high-dose metoprolol 100 mg on 5/7 and 5/8.  No nephrotoxins that I can see. Urine output has not been well recorded, only 150 mL recorded yesterday, 350 the day before, and none recorded today.  Urinalysis done today shows 100 protein but no other cells.  Upon interview patient is confused. He has a urostomy and at least 400 mL of urine currently in a drainage bag- receiving blood for a hemoglobin of 6  Creatinine  Date/Time Value Ref Range Status  12/12/2012 04:22 AM 1.75 (H) 0.60 - 1.30 mg/dL Final  12/10/2012 05:02 AM 1.84 (H) 0.60 - 1.30 mg/dL Final  12/09/2012 04:47 AM 1.60 (H) 0.60 - 1.30 mg/dL Final  11/21/2012 03:02 PM 2.10 (H) 0.60 - 1.30 mg/dL Final   Creatinine, Ser  Date/Time Value Ref Range Status  02/17/2018 07:37 AM 4.74 (H) 0.61 - 1.24 mg/dL Final  02/07/2018 09:41 AM 1.72 (H) 0.61 - 1.24 mg/dL Final  05/29/2017 10:38 AM 1.80 (H) 0.61 - 1.24 mg/dL Final  03/09/2017 10:20 AM 1.50 (H) 0.61 - 1.24 mg/dL Final  03/04/2017 04:03 AM 1.94 (H) 0.61  - 1.24 mg/dL Final  03/03/2017 03:31 AM 1.94 (H) 0.61 - 1.24 mg/dL Final  03/02/2017 09:26 PM 1.66 (H) 0.61 - 1.24 mg/dL Final  02/17/2017 02:00 PM 1.99 (H) 0.61 - 1.24 mg/dL Final     PMHx:   Past Medical History:  Diagnosis Date  . Depression   . Diabetes mellitus without complication ([place])   . Dyspnea   . Elevated lipids   . GERD (gastroesophageal reflux disease)   . Hypertension   . Pulmonary emboli ([place])   . Renal insufficiency    only has 1 kidney  . Sleep apnea   . Stroke ([place])    Rt side weakness    Past Surgical History:  Procedure Laterality Date  . 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 AORTA SURGERY    . CAROTID ARTERY - SUBCLAVIAN ARTERY BYPASS GRAFT Bilateral   . CATARACT EXTRACTION W/ INTRAOCULAR LENS  IMPLANT, BILATERAL    . CHOLECYSTECTOMY    . EYE SURGERY    . FEMUR FRACTURE SURGERY Right   . HIP FRACTURE SURGERY Bilateral   . JOINT REPLACEMENT    . PROSTATE SURGERY    . REPLACEMENT TOTAL KNEE Right   . TOTAL HIP ARTHROPLASTY Right 03/02/2017   Procedure: TOTAL HIP ARTHROPLASTY ANTERIOR APPROACH;  Surgeon: [name], MD;  Location: [facility] ORS;  Service: Orthopedics;  Laterality: Right;  Mar[facility] Kitchen VASCULAR SURGERY  Family Hx:  Family History  Problem Relation Age of Onset  . Heart attack Father   . Stroke Father   . Heart disease Father     Social History:  reports that he quit smoking about 66 years ago. He has quit using smokeless tobacco. He reports that he does not drink alcohol or use drugs.  Allergies:  Allergies  Allergen Reactions  . Codeine Anaphylaxis  . Morphine Anaphylaxis  . Gabapentin Other (See Comments)    Unsure of what the reaction was   . Stadol [Butorphanol]     Unknown  . Lyrica [Pregabalin] Rash  . Tapentadol Nausea Only    ONSET 01/04/2013    Medications: Prior to Admission medications   Medication Sig Start Date End Date Taking? Authorizing Provider  atorvastatin (LIPITOR)  20 MG tablet Take 20 mg by mouth at bedtime.  02/02/17  Yes [provider]  beta carotene w/minerals (OCUVITE) tablet Take 1 tablet by mouth at bedtime.    Yes [provider]  diclofenac sodium (VOLTAREN) 1 % GEL Apply 1 application topically 4 (four) times daily as needed (for pain.).  03/24/17  Yes [provider]  furosemide (LASIX) 40 MG tablet Take 40 mg by mouth at bedtime.  02/09/17  Yes [provider]  losartan (COZAAR) 25 MG tablet Take 25 mg by mouth at bedtime.  01/14/17  Yes [provider]  metoprolol succinate (TOPROL-XL) 100 MG 24 hr tablet Take 100 mg by mouth at bedtime.  02/12/16  Yes [provider]  NOVOLOG MIX 70/30 FLEXPEN (70-30) 100 UNIT/ML FlexPen Inject 25 Units into the skin 2 (two) times daily. 11/20/16  Yes [provider]  sertraline (ZOLOFT) 100 MG tablet Take 100 mg by mouth at bedtime.  01/23/17  Yes [provider]  XARELTO 15 MG TABS tablet Take 15 mg by mouth at bedtime.  01/23/17  Yes [provider]  tiotropium (SPIRIVA HANDIHALER) 18 MCG inhalation capsule Place 1 capsule (18 mcg total) into inhaler and inhale daily. Patient not taking: Reported on 02/02/2018 04/07/17 04/07/18  Laverle Hobby, MD    I have reviewed the patient's current medications.  Labs:  Results for orders placed or performed during the hospital encounter of 02/15/18 (from the past 48 hour(s))  POCT I-Stat EG7     Status: Abnormal   Collection Time: 02/15/18  2:47 PM  Result Value Ref Range   pH, Ven 7.248 (L) 7.250 - 7.430   pCO2, Ven 51.6 44.0 - 60.0 mmHg   pO2, Ven 30.0 (LL) 32.0 - 45.0 mmHg   Bicarbonate 22.5 20.0 - 28.0 mmol/L   TCO2 24 22 - 32 mmol/L   O2 Saturation 47.0 %   Acid-base deficit 5.0 (H) 0.0 - 2.0 mmol/L   Sodium 145 135 - 145 mmol/L   Potassium 4.4 3.5 - 5.1 mmol/L   Calcium, Ion 1.07 (L) 1.15 - 1.40 mmol/L   HCT 26.0 (L) 39.0 - 52.0 %   Hemoglobin 8.8 (L) 13.0 - 17.0 g/dL   Patient  temperature HIDE    Sample type VENOUS   Glucose, capillary     Status: Abnormal   Collection Time: 02/15/18  4:28 PM  Result Value Ref Range   Glucose-Capillary 197 (H) 65 - 99 mg/dL   Comment 1 Notify RN   Glucose, capillary     Status: Abnormal   Collection Time: 02/15/18  9:33 PM  Result Value Ref Range   Glucose-Capillary 236 (H) 65 - 99 mg/dL  Glucose, capillary     Status: Abnormal   Collection Time: 02/16/18  6:27 AM  Result Value Ref Range   Glucose-Capillary 184 (H) 65 - 99 mg/dL  Glucose, capillary     Status: Abnormal   Collection Time: 02/16/18 12:34 PM  Result Value Ref Range   Glucose-Capillary 149 (H) 65 - 99 mg/dL  Glucose, capillary     Status: Abnormal   Collection Time: 02/16/18  4:39 PM  Result Value Ref Range   Glucose-Capillary 120 (H) 65 - 99 mg/dL  Glucose, capillary     Status: Abnormal   Collection Time: 02/16/18  9:29 PM  Result Value Ref Range   Glucose-Capillary 104 (H) 65 - 99 mg/dL   Comment 1 Notify RN    Comment 2 Document in Chart   Glucose, capillary     Status: Abnormal   Collection Time: 02/17/18  6:04 AM  Result Value Ref Range   Glucose-Capillary 127 (H) 65 - 99 mg/dL   Comment 1 Notify RN    Comment 2 Document in Chart   Glucose, capillary     Status: Abnormal   Collection Time: 02/17/18  7:31 AM  Result Value Ref Range   Glucose-Capillary 107 (H) 65 - 99 mg/dL  CBC with Differential/Platelet     Status: Abnormal   Collection Time: 02/17/18  7:37 AM  Result Value Ref Range   WBC 16.5 (H) 4.0 - 10.5 K/uL    Comment: REPEATED TO VERIFY   RBC 2.57 (L) 4.22 - 5.81 MIL/uL   Hemoglobin 6.0 (LL) 13.0 - 17.0 g/dL    Comment: REPEATED TO VERIFY CRITICAL RESULT CALLED TO, READ BACK BY AND VERIFIED WITH: Alvira Philips, RN [phone] 02/17/2018 BY MACEDA,J.    HCT 20.6 (L) 39.0 - 52.0 %   MCV 80.2 78.0 - 100.0 fL   MCH 23.3 (L) 26.0 - 34.0 pg   MCHC 29.1 (L) 30.0 - 36.0 g/dL   RDW 17.3 (H) 11.5 - 15.5 %   Platelets 126 (L) 150 - 400 K/uL     Comment: REPEATED TO VERIFY   Neutrophils Relative % 82 %   Neutro Abs 13.5 (H) 1.7 - 7.7 K/uL   Lymphocytes Relative 8 %   Lymphs Abs 1.4 0.7 - 4.0 K/uL   Monocytes Relative 10 %   Monocytes Absolute 1.6 (H) 0.1 - 1.0 K/uL   Eosinophils Relative 0 %   Eosinophils Absolute 0.0 0.0 - 0.7 K/uL   Basophils Relative 0 %   Basophils Absolute 0.0 0.0 - 0.1 K/uL    Comment: Performed at [facility], [address] [place], [place] [address]  Basic metabolic panel     Status: Abnormal   Collection Time: 02/17/18  7:37 AM  Result Value Ref Range   Sodium 135 135 - 145 mmol/L   Potassium 6.1 (H) 3.5 - 5.1 mmol/L   Chloride 104 101 - 111 mmol/L   CO2 22 22 - 32 mmol/L   Glucose, Bld 102 (H) 65 - 99 mg/dL   BUN 51 (H) 6 - 20 mg/dL   Creatinine, Ser 4.74 (H) 0.61 - 1.24 mg/dL   Calcium 8.2 (L) 8.9 - 10.3 mg/dL   GFR calc non Af Amer 10 (L) >60 mL/min   GFR calc Af Amer 12 (L) >60 mL/min    Comment: (NOTE) The eGFR has been calculated using the CKD EPI equation. This calculation has not been validated in all clinical situations. eGFR's persistently <60 mL/min signify possible Chronic Kidney  Disease.    Anion gap 9 5 - 15    Comment: Performed at [facility] [address] [place], [place] [address]  Prepare RBC     Status: None   Collection Time: 02/17/18  9:22 AM  Result Value Ref Range   Order Confirmation      ORDER PROCESSED BY BLOOD BANK Performed at [facility], [address] [place], [place] [address]   Glucose, capillary     Status: None   Collection Time: 02/17/18 11:01 AM  Result Value Ref Range   Glucose-Capillary 94 65 - 99 mg/dL     ROS:  Review of systems not obtained due to patient factors. patient confused. Is having urine output. Was unaware that he had surgery, no complaints of edema  Physical Exam: Vitals:   02/17/18 1018 02/17/18 1311  BP: (!) 137/40 (!) 112/40  Pulse: 78 74  Resp: 17 18  Temp: 98.4 F (36.9 C) 98.6 F (37 C)   SpO2: 96%      General: morbidly obese, elderly white male who is not currently oriented HEENT: pupils are equal round reactive to light, extra motions are intact, mucous membranes are moist Neck: no JVD Heart:  Regular rate and rhythm Lungs: anteriorly clear but difficult exam Abdomen: obese abdomen, back brace in place. Urostomy and right lower quadrant draining fairly clear urine Extremities: pale, if anything trace edema Skin: warm and dry Neuro: alert but not oriented  Assessment/Plan: 84 year old white male with baseline solitary kidney and CKD creatinine around 1.8.  now with AKI 2 days postop after lumbar spine decompression surgery 1.Renal- AKI in the setting of above. Fortunately, appears to be nonoliguric so dont feel has obstruction. His blood pressure has been low in the setting of an ARB and I suspect that this is the issue and he has ATN from that. I have written order to stop ARB and decrease metoprolol.  There are no indications for dialysis at this time and hope because he's making urine but we will not get there. I will continue to follow urine output and labs daily 2. Hypertension/volume  - has been hypotensive after surgery. His in's are much greater than out but he does not appear to be significantly volume overloaded at this time. I don't feel he needs extra fluids 3. Hyperkalemia- holding ARB should help. I started him on Veltassa - will follow up on K tonight to see if anything else is needed 4. Anemia  -  Postop- getting transfusion at present 5. Confusion- combo of meds and renal dysfunction- being conservative with narcotics    Monque Haggar A 02/17/2018, 2:43 PM

## 2018-02-17 NOTE — Progress Notes (Signed)
Pt is more alert this morning but continues to be confused. Aware of self only.

## 2018-02-17 NOTE — Progress Notes (Signed)
Pt is more disoriented just like day nurse noted and reported. It appears to be from pain meds that he request for his recent back surgery. Will administer Tylenol for further pain management if needed. Will continue to monitor and pass on for further observation.

## 2018-02-17 NOTE — Progress Notes (Addendum)
OT Cancellation Note  Patient Details Name: CHANLER MENDONCA MRN: [id number] DOB: [birth year]   Cancelled Treatment:    Reason Eval/Treat Not Completed: Medical issues which prohibited therapy (Hgb 6.0, will await blood transfusion prior to initiation of therapy)  Gaye Alken M.S., OTR/L Pager: [phone]  02/17/2018, 11:01 AM

## 2018-02-17 NOTE — Progress Notes (Signed)
Occupational Therapy Treatment Patient Details Name: Gary Thomas MRN: [id number] DOB: 1932-07-01 Today's Date: 02/17/2018    History of present illness 84 year old man with  HNP, spondylosis, prior laminectomy at L 5 S 1 on the left, with spondylolisthesis,  stenosis, DDD, radiculopathy L 34, L 45, L 5 S 1 levels. Underwent Lumbar Three-Four,Lumbar Four-Five, Lumbar Five-Sacral One Posterior lumbar  fusion. (N/A) decompression, pedicle crew fixation and posterolateral arthrodesis with local autograft   OT comments  Pt able to perform stand pivot transfer with mod assist +2 and max multimodal cues today. Pt with significant difficulty sequencing familiar functional tasks (brushing teeth, walking) and has very poor safety awareness currently. Pt able to recall 1/3 back precautions. D/c plan remains appropriate. Will continue to follow acutely.   Follow Up Recommendations  SNF;Supervision/Assistance - 24 hour    Equipment Recommendations  None recommended by OT    Recommendations for Other Services      Precautions / Restrictions Precautions Precautions: Fall;Back Precaution Booklet Issued: No Precaution Comments: Pt able to recall 1/3 precautions, reviewed all with pt Required Braces or Orthoses: Spinal Brace Spinal Brace: Lumbar corset;Applied in sitting position Restrictions Weight Bearing Restrictions: No       Mobility Bed Mobility Overal bed mobility: Needs Assistance Bed Mobility: Rolling;Sidelying to Sit Rolling: Mod assist Sidelying to sit: Max assist;+2 for physical assistance       General bed mobility comments: Difficulty sequencing bed mobility. Max multimodal cues thorughotu for sequencing and safety  Transfers Overall transfer level: Needs assistance Equipment used: Rolling walker (2 wheeled) Transfers: Sit to/from [employer] Sit to Stand: Mod assist;+2 physical assistance Stand pivot transfers: Mod assist;+2 physical assistance        General transfer comment: Max multimodal cues thorughout for sequencing and safety. Mod assist +2 to boost up from EOB--pt with both hands on RW throughout. Mod assist for stand pivot transfer EOB>chair    Balance Overall balance assessment: Needs assistance Sitting-balance support: Feet supported;Bilateral upper extremity supported Sitting balance-Leahy Scale: Fair Sitting balance - Comments: Close min guard throughout   Standing balance support: Bilateral upper extremity supported Standing balance-Leahy Scale: Poor                             ADL either performed or assessed with clinical judgement   ADL Overall ADL's : Needs assistance/impaired     Grooming: Maximal assistance;Sitting;Oral care Grooming Details (indicate cue type and reason): Pt with difficulty sequencing use of toothbrush in mouth, rinising, and spitting. Max multimodal cues thorughout with max assist to brush teeth.         Upper Body Dressing : Maximal assistance;Sitting Upper Body Dressing Details (indicate cue type and reason): to don gown and brace Lower Body Dressing: Total assistance;Sit to/from stand Lower Body Dressing Details (indicate cue type and reason): to don socks Toilet Transfer: Moderate assistance;+2 for physical assistance;Stand-pivot;RW;Cueing for safety;Cueing for sequencing Toilet Transfer Details (indicate cue type and reason): Simulated by transfer EOB>chair. Max cues for safety and sequencing         Functional mobility during ADLs: Moderate assistance;+2 for physical assistance;Cueing for safety;Cueing for sequencing;Rolling walker(for stand pivot only)       Vision       Perception     Praxis      Cognition Arousal/Alertness: Awake/alert Behavior During Therapy: Restless;Impulsive Overall Cognitive Status: Impaired/Different from baseline Area of Impairment: Attention;Memory;Following commands;Safety/judgement;Awareness;Problem solving  Current Attention Level: Selective Memory: Decreased recall of precautions;Decreased short-term memory Following Commands: Follows one step commands inconsistently;Follows one step commands with increased time Safety/Judgement: Decreased awareness of safety;Decreased awareness of deficits Awareness: Emergent Problem Solving: Slow processing;Difficulty sequencing;Requires verbal cues;Requires tactile cues General Comments: Significant difficulty sequencing familiar tasks: walking, brushing teeth even with max multimodal cues. Impulsive when sitting down        Exercises     Shoulder Instructions       General Comments      Pertinent Vitals/ Pain       Pain Assessment: Faces Faces Pain Scale: Hurts even more Pain Location: back Pain Descriptors / Indicators: Aching;Grimacing Pain Intervention(s): Monitored during session;Limited activity within patient's tolerance;Repositioned  Home Living                                          Prior Functioning/Environment              Frequency  Min 2X/week        Progress Toward Goals  OT Goals(current goals can now be found in the care plan section)  Progress towards OT goals: Not progressing toward goals - comment(cognition limiting progress)  Acute Rehab OT Goals Patient Stated Goal: to get better OT Goal Formulation: With patient/family  Plan Discharge plan remains appropriate    Co-evaluation    PT/OT/SLP Co-Evaluation/Treatment: Yes Reason for Co-Treatment: Necessary to address cognition/behavior during functional activity;For patient/therapist safety;To address functional/ADL transfers   OT goals addressed during session: ADL's and self-care      AM-PAC PT "6 Clicks" Daily Activity     Outcome Measure   Help from another person eating meals?: A Lot Help from another person taking care of personal grooming?: A Lot Help from another person toileting, which includes using toliet,  bedpan, or urinal?: A Lot Help from another person bathing (including washing, rinsing, drying)?: A Lot Help from another person to put on and taking off regular upper body clothing?: A Lot Help from another person to put on and taking off regular lower body clothing?: Total 6 Click Score: 11    End of Session Equipment Utilized During Treatment: Gait belt;Rolling walker;Back brace;Oxygen  OT Visit Diagnosis: Other abnormalities of gait and mobility (R26.89);Muscle weakness (generalized) (M62.81);History of falling (Z91.81);Other symptoms and signs involving cognitive function;Pain Pain - part of body: (back)   Activity Tolerance Patient tolerated treatment well   Patient Left in chair;with call bell/phone within reach;with family/visitor present   Nurse Communication          Time: 9562-1308 OT Time Calculation (min): 35 min  Charges: OT General Charges $OT Visit: 1 Visit OT Treatments $Self Care/Home Management : 8-22 mins  Vada Yellen A. Brett Albino, M.S., OTR/L Pager: [phone]   Gaye Alken 02/17/2018, 2:27 PM

## 2018-02-18 LAB — BPAM RBC
BLOOD PRODUCT EXPIRATION DATE: 201906032359
Blood Product Expiration Date: 201906022359
ISSUE DATE / TIME: 201905091305
ISSUE DATE / TIME: 201905090951
UNIT TYPE AND RH: 5100
Unit Type and Rh: 5100

## 2018-02-18 LAB — RENAL FUNCTION PANEL
ALBUMIN: 2.5 g/dL — AB (ref 3.5–5.0)
Anion gap: 8 (ref 5–15)
BUN: 47 mg/dL — AB (ref 6–20)
CO2: 21 mmol/L — ABNORMAL LOW (ref 22–32)
CREATININE: 3.48 mg/dL — AB (ref 0.61–1.24)
Calcium: 8.1 mg/dL — ABNORMAL LOW (ref 8.9–10.3)
Chloride: 109 mmol/L (ref 101–111)
GFR calc Af Amer: 17 mL/min — ABNORMAL LOW (ref >60)
GFR, EST NON AFRICAN AMERICAN: 15 mL/min — AB (ref >60)
Glucose, Bld: 82 mg/dL (ref 65–99)
PHOSPHORUS: 3.6 mg/dL (ref 2.5–4.6)
POTASSIUM: 4.9 mmol/L (ref 3.5–5.1)
SODIUM: 138 mmol/L (ref 135–145)

## 2018-02-18 LAB — CBC
HCT: 24.5 % — ABNORMAL LOW (ref 39.0–52.0)
HEMOGLOBIN: 7.6 g/dL — AB (ref 13.0–17.0)
MCH: 25.1 pg — ABNORMAL LOW (ref 26.0–34.0)
MCHC: 31 g/dL (ref 30.0–36.0)
MCV: 80.9 fL (ref 78.0–100.0)
PLATELETS: 116 10*3/uL — AB (ref 150–400)
RBC: 3.03 MIL/uL — AB (ref 4.22–5.81)
RDW: 17 % — ABNORMAL HIGH (ref 11.5–15.5)
WBC: 11.7 10*3/uL — AB (ref 4.0–10.5)

## 2018-02-18 LAB — TYPE AND SCREEN
ABO/RH(D): O POS
ANTIBODY SCREEN: NEGATIVE
UNIT DIVISION: 0
Unit division: 0

## 2018-02-18 LAB — GLUCOSE, CAPILLARY
GLUCOSE-CAPILLARY: 117 mg/dL — AB (ref 65–99)
Glucose-Capillary: 91 mg/dL (ref 65–99)
Glucose-Capillary: 105 mg/dL — ABNORMAL HIGH (ref 65–99)
Glucose-Capillary: 107 mg/dL — ABNORMAL HIGH (ref 65–99)

## 2018-02-18 MED FILL — Heparin Sodium (Porcine) Inj 1000 Unit/ML: INTRAMUSCULAR | Qty: 30 | Status: AC

## 2018-02-18 MED FILL — Sodium Chloride IV Soln 0.9%: INTRAVENOUS | Qty: 2000 | Status: AC

## 2018-02-18 NOTE — Progress Notes (Signed)
CSW following for discharge plan. CSW confirmed bed availability at [facility] for patient when medically ready. CSW confirmed that patient could admit over the weekend, if stable.  CSW will continue to follow.  Blenda Nicely, [place] Clinical Social Worker [phone]

## 2018-02-18 NOTE — Progress Notes (Signed)
  [facility] Progress Note    Assessment/ Plan:    84 year old white male with baseline solitary kidney and CKD creatinine around 1.8.  Now with AKI 3 days postop after lumbar spine decompression surgery.    1. AKI.  SCr 4.26>3.48.  UOP 800 cc over 24 h, do not feel this is obstructive process.  Likely ATN from hypotension, ARB stopped and metoprolol decreased yesterday.  No indications for urgent dialysis at this time.  Plan to continue to monitor SCr and follow UOP.   2. Hypertension/volume  - normotensive this morning  3. Hyperkalemia- 5.8>4.9 today.  Holding ARB should help, will stop Valtessa as K+ has improved  4. Anemia  - Postop- Hgb 6.0, s/p 2U>>7.6  5. Confusion   Subjective:   Anemia improved with 2U blood products.  Patient appears confused.  No acute overnight events.    Objective:   BP (!) 166/56 (BP Location: Right Arm)   Pulse 79   Temp 98 F (36.7 C) (Oral)   Resp 17   Ht  (1.753 m)   Wt 218 lb (98.9 kg)   SpO2 (!) 89%   BMI 32.19 kg/m   Intake/Output Summary (Last 24 hours) at 02/18/2018 1639 Last data filed at 02/18/2018 1256 Gross per 24 hour  Intake 420 ml  Output 1650 ml  Net -1230 ml    Physical Exam: General: 84 y/o male, NAD  HEENT: PERRL, EOMI, MMM  Neck: no JVD Heart:  RRR, no MRG  Lungs: CTAB  Abdomen: obese abdomen, back brace in place. Urostomy.  Extremities: trace edema, nontender  Skin: warm and dry Neuro: alert but not oriented  Imaging: No results found.  Labs: BMET Recent Labs  Lab 02/15/18 1447 02/17/18 0737 02/17/18 1832 02/18/18 0404  NA 145 135 136 138  K 4.4 6.1* 5.8* 4.9  CL  --  104 107 109  CO2  --  22 22 21*  GLUCOSE  --  102* 100* 82  BUN  --  51* 52* 47*  CREATININE  --  4.74* 4.26* 3.48*  CALCIUM  --  8.2* 8.1* 8.1*  PHOS  --   --  4.3 3.6   CBC Recent Labs  Lab 02/15/18 1447 02/17/18 0737 02/18/18 0404  WBC  --  16.5* 11.7*  NEUTROABS  --  13.5*  --   HGB 8.8* 6.0* 7.6*   HCT 26.0* 20.6* 24.5*  MCV  --  80.2 80.9  PLT  --  126* 116*    Medications:    . atorvastatin  20 mg Oral QHS  . docusate sodium  100 mg Oral BID  . famotidine  20 mg Oral BID  . furosemide  40 mg Oral Daily  . insulin aspart  0-15 Units Subcutaneous TID WC  . insulin aspart  0-5 Units Subcutaneous QHS  . insulin aspart  4 Units Subcutaneous TID WC  . insulin aspart protamine- aspart  25 Units Subcutaneous BID WC  . metoprolol succinate  25 mg Oral QHS  . multivitamin  1 tablet Oral QHS  . sertraline  100 mg Oral QHS  . sodium chloride flush  3 mL Intravenous Q12H    Freddrick March MD  [facility], PGY-2

## 2018-02-18 NOTE — Plan of Care (Signed)
  Problem: Education: Goal: Knowledge of General Education information will improve Outcome: Progressing   Problem: Health Behavior/Discharge Planning: Goal: Ability to manage health-related needs will improve Outcome: Progressing   Problem: Clinical Measurements: Goal: Ability to maintain clinical measurements within normal limits will improve Outcome: Progressing Goal: Will remain free from infection Outcome: Progressing Goal: Diagnostic test results will improve Outcome: Progressing Goal: Respiratory complications will improve Outcome: Progressing Goal: Cardiovascular complication will be avoided Outcome: Progressing   Problem: Activity: Goal: Risk for activity intolerance will decrease Outcome: Progressing   Problem: Nutrition: Goal: Adequate nutrition will be maintained Outcome: Progressing   Problem: Coping: Goal: Level of anxiety will decrease Outcome: Progressing   Problem: Elimination: Goal: Will not experience complications related to bowel motility Outcome: Progressing Goal: Will not experience complications related to urinary retention Outcome: Progressing   Problem: Pain Managment: Goal: General experience of comfort will improve Outcome: Progressing   Problem: Safety: Goal: Ability to remain free from injury will improve Outcome: Progressing   Problem: Skin Integrity: Goal: Risk for impaired skin integrity will decrease Outcome: Progressing   Problem: Activity: Goal: Ability to avoid complications of mobility impairment will improve Outcome: Progressing Goal: Ability to tolerate increased activity will improve Outcome: Progressing Goal: Will remain free from falls Outcome: Progressing   Problem: Bowel/Gastric: Goal: Gastrointestinal status for postoperative course will improve Outcome: Progressing   Problem: Education: Goal: Ability to verbalize activity precautions or restrictions will improve Outcome: Progressing Goal: Knowledge of the  prescribed therapeutic regimen will improve Outcome: Progressing Goal: Understanding of discharge needs will improve Outcome: Progressing   Problem: Physical Regulation: Goal: Ability to maintain clinical measurements within normal limits will improve Outcome: Progressing Goal: Postoperative complications will be avoided or minimized Outcome: Progressing Goal: Diagnostic test results will improve Outcome: Progressing   Problem: Pain Management: Goal: Pain level will decrease Outcome: Progressing   Problem: Skin Integrity: Goal: Signs of wound healing will improve Outcome: Progressing   Problem: Health Behavior/Discharge Planning: Goal: Identification of resources available to assist in meeting health care needs will improve Outcome: Progressing   Problem: Bladder/Genitourinary: Goal: Urinary functional status for postoperative course will improve Outcome: Progressing   

## 2018-02-18 NOTE — Progress Notes (Signed)
Subjective: Patient reports "I think it wouldf be good to go to the rehab place"  "Did he operate on me yesterday?"  Objective: Vital signs in last 24 hours: Temp:  [97.5 F (36.4 C)-98.7 F (37.1 C)] 98.7 F (37.1 C) (05/10 0420) Pulse Rate:  [69-78] 74 (05/10 0420) Resp:  [16-20] 18 (05/10 0420) BP: (111-137)/(38-82) 121/41 (05/10 0420) SpO2:  [90 %-98 %] 93 % (05/10 0420)  Intake/Output from previous day: 05/09 0701 - 05/10 0700 In: 1935.3 [P.O.:240; I.V.:186.3; Blood:609; IV Piggyback:900] Out: 950 [Urine:800; Drains:150] Intake/Output this shift: No intake/output data recorded.  Awakens to voice and converses. More alert today, oriented to person and place. Does not remember yesterday's confusion. Good strength. Incision flat without erythema or drainage.    Lab Results: Recent Labs    02/17/18 0737 02/18/18 0404  WBC 16.5* 11.7*  HGB 6.0* 7.6*  HCT 20.6* 24.5*  PLT 126* 116*   BMET Recent Labs    02/17/18 1832 02/18/18 0404  NA 136 138  K 5.8* 4.9  CL 107 109  CO2 22 21*  GLUCOSE 100* 82  BUN 52* 47*  CREATININE 4.26* 3.48*  CALCIUM 8.1* 8.1*    Studies/Results: No results found.  Assessment/Plan: improving  LOS: 3 days  Mobilize with therapies, plan for SNF. Continue to hold pain meds other than Tylenol. We appreciate Nephrology's assistance.    Georgiann Cocker 02/18/2018, 7:44 AM

## 2018-02-18 NOTE — Progress Notes (Signed)
Physical Therapy Treatment Patient Details Name: EZRA DENNE MRN: [id number] DOB: [birth year] Today's Date: 02/18/2018    History of Present Illness 84 year old man with  HNP, spondylosis, prior laminectomy at L 5 S 1 on the left, with spondylolisthesis,  stenosis, DDD, radiculopathy L 34, L 45, L 5 S 1 levels. Underwent Lumbar Three-Four,Lumbar Four-Five, Lumbar Five-Sacral One Posterior lumbar  fusion. (N/A) decompression, pedicle crew fixation and posterolateral arthrodesis with local autograft    PT Comments    Patient agreeable to participate in therapy. Pt is still confused and easily distracted during session. Pt required mod A for bed mobility and back precautions reviewed with pt beginning of session with pt able to recall 1/3. Pt stood X3 from EOB however did not maintain standing long enough to attempt stand pivot transfer or gait training this session despite max encouragement. Wife present during session. Continue to progress as tolerated.    Follow Up Recommendations  SNF     Equipment Recommendations  Other (comment)(TBD)    Recommendations for Other Services       Precautions / Restrictions Precautions Precautions: Fall;Back Precaution Booklet Issued: No Precaution Comments: Pt able to recall 1/3 precautions, reviewed all with pt Required Braces or Orthoses: Spinal Brace Spinal Brace: Lumbar corset;Applied in sitting position Restrictions Weight Bearing Restrictions: No    Mobility  Bed Mobility Overal bed mobility: Needs Assistance Bed Mobility: Rolling;Sidelying to Sit;Sit to Supine Rolling: Mod assist Sidelying to sit: HOB elevated;Mod assist   Sit to supine: Max assist;+2 for physical assistance   General bed mobility comments: step by step cues for sequencing and use of rail  Transfers Overall transfer level: Needs assistance Equipment used: Rolling walker (2 wheeled) Transfers: Sit to/from [employer] Transfers Sit to Stand: +2 physical  assistance;Min assist;From elevated surface         General transfer comment: pt stood X3 from EOB however would not maintain standing long enough for transfer and requesting repeatedly go back to supine  Ambulation/Gait                 Stairs             Wheelchair Mobility    Modified Rankin (Stroke Patients Only)       Balance Overall balance assessment: Needs assistance Sitting-balance support: Feet supported;Bilateral upper extremity supported Sitting balance-Leahy Scale: Poor Sitting balance - Comments: pt leaning heavily over to R side and corrects with cues however goes right back to R lateral lean holding onto footboard of bed   Standing balance support: Bilateral upper extremity supported Standing balance-Leahy Scale: Poor                              Cognition Arousal/Alertness: Awake/alert Behavior During Therapy: Restless;Impulsive Overall Cognitive Status: Impaired/Different from baseline Area of Impairment: Attention;Memory;Following commands;Safety/judgement;Awareness;Problem solving                   Current Attention Level: Sustained Memory: Decreased recall of precautions;Decreased short-term memory Following Commands: Follows one step commands inconsistently;Follows one step commands with increased time Safety/Judgement: Decreased awareness of safety;Decreased awareness of deficits Awareness: Emergent Problem Solving: Slow processing;Difficulty sequencing;Requires verbal cues;Requires tactile cues;Decreased initiation        Exercises      General Comments General comments (skin integrity, edema, etc.): wife present during session; O2 off upon arrival to room however laying right beside pt with SpO2 87% on RA and 2.5L O2 reapplied  with SpO2 93%       Pertinent Vitals/Pain Pain Assessment: Faces Faces Pain Scale: Hurts even more Pain Location: back Pain Descriptors / Indicators: Grimacing;Guarding Pain  Intervention(s): Limited activity within patient's tolerance;Monitored during session;Repositioned    Home Living                      Prior Function            PT Goals (current goals can now be found in the care plan section) Acute Rehab PT Goals Patient Stated Goal: to get better Progress towards PT goals: Progressing toward goals    Frequency    Min 3X/week      PT Plan Current plan remains appropriate    Co-evaluation              AM-PAC PT "6 Clicks" Daily Activity  Outcome Measure  Difficulty turning over in bed (including adjusting bedclothes, sheets and blankets)?: Unable Difficulty moving from lying on back to sitting on the side of the bed? : Unable Difficulty sitting down on and standing up from a chair with arms (e.g., wheelchair, bedside commode, etc,.)?: Unable Help needed moving to and from a bed to chair (including a wheelchair)?: A Lot   Help needed climbing 3-5 steps with a railing? : Total 6 Click Score: 6    End of Session Equipment Utilized During Treatment: Gait belt;Oxygen Activity Tolerance: Other (comment);Patient limited by pain(limited by confusion) Patient left: with call bell/phone within reach;in bed;with bed alarm set;with family/visitor present Nurse Communication: Mobility status PT Visit Diagnosis: Pain;Other symptoms and signs involving the nervous system (R29.898) Pain - part of body: (back and bilat LE)     Time: 1039-1100 PT Time Calculation (min) (ACUTE ONLY): 21 min  Charges:  $Therapeutic Activity: 8-22 mins                    G Codes:       [name], PTA Pager: [phone]     Carolynne Edouard 02/18/2018, 11:22 AM

## 2018-02-19 LAB — RENAL FUNCTION PANEL
ALBUMIN: 2.5 g/dL — AB (ref 3.5–5.0)
ANION GAP: 8 (ref 5–15)
BUN: 40 mg/dL — ABNORMAL HIGH (ref 6–20)
CHLORIDE: 112 mmol/L — AB (ref 101–111)
CO2: 22 mmol/L (ref 22–32)
Calcium: 8.3 mg/dL — ABNORMAL LOW (ref 8.9–10.3)
Creatinine, Ser: 2.45 mg/dL — ABNORMAL HIGH (ref 0.61–1.24)
GFR calc Af Amer: 26 mL/min — ABNORMAL LOW (ref >60)
GFR, EST NON AFRICAN AMERICAN: 23 mL/min — AB (ref >60)
Glucose, Bld: 100 mg/dL — ABNORMAL HIGH (ref 65–99)
PHOSPHORUS: 3.7 mg/dL (ref 2.5–4.6)
POTASSIUM: 4.6 mmol/L (ref 3.5–5.1)
Sodium: 142 mmol/L (ref 135–145)

## 2018-02-19 LAB — CBC
HEMATOCRIT: 25.4 % — AB (ref 39.0–52.0)
HEMOGLOBIN: 7.7 g/dL — AB (ref 13.0–17.0)
MCH: 24.5 pg — ABNORMAL LOW (ref 26.0–34.0)
MCHC: 30.3 g/dL (ref 30.0–36.0)
MCV: 80.9 fL (ref 78.0–100.0)
Platelets: 131 10*3/uL — ABNORMAL LOW (ref 150–400)
RBC: 3.14 MIL/uL — ABNORMAL LOW (ref 4.22–5.81)
RDW: 17.5 % — ABNORMAL HIGH (ref 11.5–15.5)
WBC: 10 10*3/uL (ref 4.0–10.5)

## 2018-02-19 LAB — GLUCOSE, CAPILLARY
Glucose-Capillary: 104 mg/dL — ABNORMAL HIGH (ref 65–99)
Glucose-Capillary: 146 mg/dL — ABNORMAL HIGH (ref 65–99)
Glucose-Capillary: 172 mg/dL — ABNORMAL HIGH (ref 65–99)
Glucose-Capillary: 97 mg/dL (ref 65–99)

## 2018-02-19 NOTE — Progress Notes (Signed)
Neurosurgery Progress Note  No issues overnight. Complains of appropriate back soreness.   EXAM:  BP (!) 159/70 (BP Location: Left Arm)   Pulse 85   Temp 97.8 F (36.6 C) (Oral)   Resp 20   Ht  (1.753 m)   Wt 98.9 kg (218 lb)   SpO2 96%   BMI 32.19 kg/m   Awake, alert, oriented to self, location but not year Speech fluent, appropriate  MAEW with good strength Incision c/d/i  PLAN Stable this am. Back pain appropriate.  Continue to hold sedating meds Scr continues to improve. Appreciate Nephrology assistance Hopeful for d/c tomorrow or Monday pending clearance

## 2018-02-19 NOTE — Plan of Care (Signed)
  Problem: Safety: Goal: Ability to remain free from injury will improve Outcome: Progressing   Problem: Skin Integrity: Goal: Risk for impaired skin integrity will decrease Outcome: Progressing   Problem: Activity: Goal: Will remain free from falls Outcome: Progressing   Problem: Bowel/Gastric: Goal: Gastrointestinal status for postoperative course will improve Outcome: Completed/Met   Problem: Skin Integrity: Goal: Signs of wound healing will improve Outcome: Progressing

## 2018-02-19 NOTE — Progress Notes (Signed)
[facility] ROUNDING NOTE   Subjective:   Interval History:  No complaints today  Brief history  84 year old white male with baseline solitary kidney and CKD creatinine around 1.8. Now with AKI 3 days postop after lumbar spine decompression surgery.    Creatinine improved now decreased to 2.45    Objective:  Vital signs in last 24 hours:  Temp:  [97.7 F (36.5 C)-98.7 F (37.1 C)] 98.1 F (36.7 C) (05/11 1153) Pulse Rate:  [79-108] 108 (05/11 1153) Resp:  [17-20] 20 (05/11 1153) BP: (141-189)/(56-78) 164/61 (05/11 1153) SpO2:  [89 %-97 %] 97 % (05/11 1153)  Weight change:  Filed Weights   02/15/18 1014  Weight: 218 lb (98.9 kg)    Intake/Output: I/O last 3 completed shifts: In: 620 [P.O.:620] Out: 3100 [Urine:3100]   Intake/Output this shift:  No intake/output data recorded.  Alert no distress  CVS- RRR RS- CTA  JVP flat  ABD- BS present soft non-distended EXT-  trace edema   Basic Metabolic Panel: Recent Labs  Lab 02/15/18 1447  02/17/18 0737 02/17/18 1832 02/18/18 0404 02/19/18 0315  NA 145  --  135 136 138 142  K 4.4  --  6.1* 5.8* 4.9 4.6  CL  --   --  104 107 109 112*  CO2  --   --  22 22 21* 22  GLUCOSE  --   --  102* 100* 82 100*  BUN  --   --  51* 52* 47* 40*  CREATININE  --   --  4.74* 4.26* 3.48* 2.45*  CALCIUM  --    < > 8.2* 8.1* 8.1* 8.3*  PHOS  --   --   --  4.3 3.6 3.7   < > = values in this interval not displayed.    Liver Function Tests: Recent Labs  Lab 02/17/18 1832 02/18/18 0404 02/19/18 0315  ALBUMIN 2.7* 2.5* 2.5*   No results for input(s): LIPASE, AMYLASE in the last 168 hours. No results for input(s): AMMONIA in the last 168 hours.  CBC: Recent Labs  Lab 02/15/18 1447 02/17/18 0737 02/18/18 0404 02/19/18 0315  WBC  --  16.5* 11.7* 10.0  NEUTROABS  --  13.5*  --   --   HGB 8.8* 6.0* 7.6* 7.7*  HCT 26.0* 20.6* 24.5* 25.4*  MCV  --  80.2 80.9 80.9  PLT  --  126* 116* 131*    Cardiac  Enzymes: No results for input(s): CKTOTAL, CKMB, CKMBINDEX, TROPONINI in the last 168 hours.  BNP: Invalid input(s): POCBNP  CBG: Recent Labs  Lab 02/18/18 1112 02/18/18 1530 02/18/18 2139 02/19/18 0653 02/19/18 1152  GLUCAP 117* 105* 107* 104* 146*    Microbiology: Results for orders placed or performed during the hospital encounter of 02/07/18  Surgical pcr screen     Status: None   Collection Time: 02/07/18  9:40 AM  Result Value Ref Range Status   MRSA, PCR NEGATIVE NEGATIVE Final   Staphylococcus aureus NEGATIVE NEGATIVE Final    Comment: (NOTE) The Xpert SA Assay (FDA approved for NASAL specimens in patients 35 years of age and older), is one component of a comprehensive surveillance program. It is not intended to diagnose infection nor to guide or monitor treatment. Performed at [facility], [address], [place], [place] [address]     Coagulation Studies: No results for input(s): LABPROT, INR in the last 72 hours.  Urinalysis: No results for input(s): COLORURINE, LABSPEC, PHURINE, GLUCOSEU, HGBUR, BILIRUBINUR, KETONESUR,  PROTEINUR, UROBILINOGEN, NITRITE, LEUKOCYTESUR in the last 72 hours.  Invalid input(s): APPERANCEUR    Imaging: No results found.   Medications:   . sodium chloride    . sodium chloride    . methocarbamol (ROBAXIN)  IV     . atorvastatin  20 mg Oral QHS  . docusate sodium  100 mg Oral BID  . famotidine  20 mg Oral BID  . furosemide  40 mg Oral Daily  . insulin aspart  0-15 Units Subcutaneous TID WC  . insulin aspart  0-5 Units Subcutaneous QHS  . insulin aspart  4 Units Subcutaneous TID WC  . insulin aspart protamine- aspart  25 Units Subcutaneous BID WC  . metoprolol succinate  25 mg Oral QHS  . multivitamin  1 tablet Oral QHS  . sertraline  100 mg Oral QHS  . sodium chloride flush  3 mL Intravenous Q12H   acetaminophen **OR** acetaminophen, alum & mag hydroxide-simeth, bisacodyl, HYDROcodone-acetaminophen,  HYDROcodone-acetaminophen, HYDROmorphone (DILAUDID) injection, menthol-cetylpyridinium **OR** phenol, methocarbamol **OR** methocarbamol (ROBAXIN)  IV, ondansetron **OR** ondansetron (ZOFRAN) IV, polyethylene glycol, promethazine, sodium chloride flush, sodium phosphate, zolpidem  Assessment/ Plan:   1. AKI.  SCr  Improved daily with good urine output he has a solitary kidney and urostomy  Likely etiology is  ATN from hypotension, the ARB has been stopped and metoprolol dose decrease  .  No indications for urgent dialysis at this time.   will continue to monitor creatinine decrease   Baseline renal function 1.7   2. Hypertension/volume - normotensive this morning  3. Hyperkalemia-  resolved hyperkalemia  4. Anemia -  Hemoglobin now a 7.6    5. Diabetes per primary team      LOS: 4 Gary Thomas  :58 PM

## 2018-02-20 LAB — RENAL FUNCTION PANEL
ANION GAP: 6 (ref 5–15)
Albumin: 2.2 g/dL — ABNORMAL LOW (ref 3.5–5.0)
BUN: 33 mg/dL — ABNORMAL HIGH (ref 6–20)
CHLORIDE: 110 mmol/L (ref 101–111)
CO2: 27 mmol/L (ref 22–32)
Calcium: 8.4 mg/dL — ABNORMAL LOW (ref 8.9–10.3)
Creatinine, Ser: 1.93 mg/dL — ABNORMAL HIGH (ref 0.61–1.24)
GFR calc Af Amer: 35 mL/min — ABNORMAL LOW (ref >60)
GFR calc non Af Amer: 30 mL/min — ABNORMAL LOW (ref >60)
GLUCOSE: 100 mg/dL — AB (ref 65–99)
POTASSIUM: 4.2 mmol/L (ref 3.5–5.1)
Phosphorus: 2.7 mg/dL (ref 2.5–4.6)
Sodium: 143 mmol/L (ref 135–145)

## 2018-02-20 LAB — GLUCOSE, CAPILLARY
GLUCOSE-CAPILLARY: 101 mg/dL — AB (ref 65–99)
GLUCOSE-CAPILLARY: 95 mg/dL (ref 65–99)
Glucose-Capillary: 79 mg/dL (ref 65–99)
Glucose-Capillary: 85 mg/dL (ref 65–99)
Glucose-Capillary: 109 mg/dL — ABNORMAL HIGH (ref 65–99)

## 2018-02-20 NOTE — Progress Notes (Signed)
Patient ID: Gary Thomas, male   DOB: 10-08-1932, 84 y.o.   MRN: [id number] Subjective: Patient reports that is doing okay. Some back soreness. Pre-well-controlled. No leg pain  Objective: Vital signs in last 24 hours: Temp:  [97.6 F (36.4 C)-98.1 F (36.7 C)] 97.6 F (36.4 C) (05/12 0323) Pulse Rate:  [75-108] 75 (05/12 0323) Resp:  [18-20] 20 (05/12 0323) BP: (110-164)/(54-62) 130/55 (05/12 0323) SpO2:  [96 %-97 %] 97 % (05/12 0323)  Intake/Output from previous day: 05/11 0701 - 05/12 0700 In: 600 [P.O.:600] Out: 1550 [Urine:1550] Intake/Output this shift: No intake/output data recorded.  Neurologic: Grossly normal  Lab Results: Lab Results  Component Value Date   WBC 10.0 02/19/2018   HGB 7.7 (L) 02/19/2018   HCT 25.4 (L) 02/19/2018   MCV 80.9 02/19/2018   PLT 131 (L) 02/19/2018   Lab Results  Component Value Date   INR 1.06 02/15/2018   BMET Lab Results  Component Value Date   NA 143 02/20/2018   K 4.2 02/20/2018   CL 110 02/20/2018   CO2 27 02/20/2018   GLUCOSE 100 (H) 02/20/2018   BUN 33 (H) 02/20/2018   CREATININE 1.93 (H) 02/20/2018   CALCIUM 8.4 (L) 02/20/2018    Studies/Results: No results found.  Assessment/Plan: Overall seems to be doing well, SCr better, pain well controlled  Estimated body mass index is 32.19 kg/m as calculated from the following:   Height as of this encounter:  (1.753 m).   Weight as of this encounter: 98.9 kg (218 lb).    LOS: 5 days    Avagrace Botelho S 02/20/2018, 8:24 AM

## 2018-02-20 NOTE — Progress Notes (Addendum)
[facility] ROUNDING NOTE   Subjective:  No complaints this morning except for mild back pain and soreness 2/2 recent surgery.  Afebrile without acute overnight events.   84 year old white male with baseline solitary kidney and CKD creatinine around 1.8. Now with AKI 5 days postop after lumbar spine decompression surgery.    Creatinine improved 2.45>1.93 with continued good urine output of 1550 cc over 24h.   Objective:  Vital signs in last 24 hours:  Temp:  [97.6 F (36.4 C)-98.3 F (36.8 C)] 98.3 F (36.8 C) (05/12 0836) Pulse Rate:  [75-108] 89 (05/12 0836) Resp:  [18-20] 20 (05/12 0836) BP: (110-164)/(54-62) 140/59 (05/12 0836) SpO2:  [96 %-100 %] 100 % (05/12 0836)  Weight change:  Filed Weights   02/15/18 1014  Weight: 218 lb (98.9 kg)    Intake/Output: I/O last 3 completed shifts: In: 600 [P.O.:600] Out: 2200 [Urine:2200]   Intake/Output this shift:  No intake/output data recorded.  General: 84 yo male, NAD  CVS- RRR no MRG  RS- CTAB, normal effort  ABD- soft non-distended, +bs  EXT-  trace edema bilaterally   Basic Metabolic Panel: Recent Labs  Lab 02/17/18 0737 02/17/18 1832 02/18/18 0404 02/19/18 0315 02/20/18 0450  NA 135 136 138 142 143  K 6.1* 5.8* 4.9 4.6 4.2  CL 104 107 109 112* 110  CO2 22 22 21* 22 27  GLUCOSE 102* 100* 82 100* 100*  BUN 51* 52* 47* 40* 33*  CREATININE 4.74* 4.26* 3.48* 2.45* 1.93*  CALCIUM 8.2* 8.1* 8.1* 8.3* 8.4*  PHOS  --  4.3 3.6 3.7 2.7    Liver Function Tests: Recent Labs  Lab 02/17/18 1832 02/18/18 0404 02/19/18 0315 02/20/18 0450  ALBUMIN 2.7* 2.5* 2.5* 2.2*   No results for input(s): LIPASE, AMYLASE in the last 168 hours. No results for input(s): AMMONIA in the last 168 hours.  CBC: Recent Labs  Lab 02/15/18 1447 02/17/18 0737 02/18/18 0404 02/19/18 0315  WBC  --  16.5* 11.7* 10.0  NEUTROABS  --  13.5*  --   --   HGB 8.8* 6.0* 7.6* 7.7*  HCT 26.0* 20.6* 24.5* 25.4*  MCV  --   80.2 80.9 80.9  PLT  --  126* 116* 131*    Cardiac Enzymes: No results for input(s): CKTOTAL, CKMB, CKMBINDEX, TROPONINI in the last 168 hours.  BNP: Invalid input(s): POCBNP  CBG: Recent Labs  Lab 02/19/18 0653 02/19/18 1152 02/19/18 1601 02/19/18 2237 02/20/18 0629  GLUCAP 104* 146* 172* 97 101*    Microbiology: Results for orders placed or performed during the hospital encounter of 02/07/18  Surgical pcr screen     Status: None   Collection Time: 02/07/18  9:40 AM  Result Value Ref Range Status   MRSA, PCR NEGATIVE NEGATIVE Final   Staphylococcus aureus NEGATIVE NEGATIVE Final    Comment: (NOTE) The Xpert SA Assay (FDA approved for NASAL specimens in patients 92 years of age and older), is one component of a comprehensive surveillance program. It is not intended to diagnose infection nor to guide or monitor treatment. Performed at [facility], [address], [place], [place] [address]     Coagulation Studies: No results for input(s): LABPROT, INR in the last 72 hours.  Urinalysis: No results for input(s): COLORURINE, LABSPEC, PHURINE, GLUCOSEU, HGBUR, BILIRUBINUR, KETONESUR, PROTEINUR, UROBILINOGEN, NITRITE, LEUKOCYTESUR in the last 72 hours.  Invalid input(s): APPERANCEUR    Imaging: No results found.   Medications:   . sodium chloride    .  sodium chloride    . methocarbamol (ROBAXIN)  IV     . atorvastatin  20 mg Oral QHS  . docusate sodium  100 mg Oral BID  . famotidine  20 mg Oral BID  . furosemide  40 mg Oral Daily  . insulin aspart  0-15 Units Subcutaneous TID WC  . insulin aspart  0-5 Units Subcutaneous QHS  . insulin aspart  4 Units Subcutaneous TID WC  . insulin aspart protamine- aspart  25 Units Subcutaneous BID WC  . metoprolol succinate  25 mg Oral QHS  . multivitamin  1 tablet Oral QHS  . sertraline  100 mg Oral QHS  . sodium chloride flush  3 mL Intravenous Q12H   acetaminophen **OR** acetaminophen, alum & mag  hydroxide-simeth, bisacodyl, HYDROcodone-acetaminophen, HYDROcodone-acetaminophen, HYDROmorphone (DILAUDID) injection, menthol-cetylpyridinium **OR** phenol, methocarbamol **OR** methocarbamol (ROBAXIN)  IV, ondansetron **OR** ondansetron (ZOFRAN) IV, polyethylene glycol, promethazine, sodium chloride flush, sodium phosphate, zolpidem  Assessment/ Plan:   1. AKI.  SCr improving daily, now 1.93, with good urine output he has a solitary kidney and urostomy.  Likely etiology is ATN 2/2 hypotension. The ARB has been stopped and metoprolol dose decreased. Nephrology signing off at this time, would be happy to re-consult if needed.  2. Hypertension/volume - normotensive 3. Hyperkalemia-  resolved  4. Anemia -  Hbg stable, 7.7    5. Diabetes per primary team    Freddrick March, MD  [facility], [place], PGY-2

## 2018-02-21 LAB — RENAL FUNCTION PANEL
ALBUMIN: 2.3 g/dL — AB (ref 3.5–5.0)
ANION GAP: 5 (ref 5–15)
BUN: 31 mg/dL — ABNORMAL HIGH (ref 6–20)
CO2: 27 mmol/L (ref 22–32)
Calcium: 8.4 mg/dL — ABNORMAL LOW (ref 8.9–10.3)
Chloride: 108 mmol/L (ref 101–111)
Creatinine, Ser: 1.71 mg/dL — ABNORMAL HIGH (ref 0.61–1.24)
GFR calc Af Amer: 41 mL/min — ABNORMAL LOW (ref >60)
GFR calc non Af Amer: 35 mL/min — ABNORMAL LOW (ref >60)
GLUCOSE: 108 mg/dL — AB (ref 65–99)
PHOSPHORUS: 2.9 mg/dL (ref 2.5–4.6)
POTASSIUM: 4.2 mmol/L (ref 3.5–5.1)
Sodium: 140 mmol/L (ref 135–145)

## 2018-02-21 LAB — GLUCOSE, CAPILLARY: Glucose-Capillary: 94 mg/dL (ref 65–99)

## 2018-02-21 MED ORDER — METHOCARBAMOL 500 MG PO TABS: 500.0000 mg | ORAL_TABLET | Freq: Four times a day (QID) | ORAL | 1 refills | Status: DC | PRN

## 2018-02-21 MED ORDER — HYDROCODONE-ACETAMINOPHEN 5-325 MG PO TABS: 2.0000 | ORAL_TABLET | ORAL | 0 refills | Status: DC | PRN

## 2018-02-21 NOTE — Progress Notes (Signed)
Physical Therapy Treatment Patient Details Name: Gary Thomas MRN: [id number] DOB: Jul 15, 1932 Today's Date: 02/21/2018    History of Present Illness 84 year old man with  HNP, spondylosis, prior laminectomy at L 5 S 1 on the left, with spondylolisthesis,  stenosis, DDD, radiculopathy L 34, L 45, L 5 S 1 levels. Underwent Lumbar Three-Four,Lumbar Four-Five, Lumbar Five-Sacral One Posterior lumbar  fusion. (N/A) decompression, pedicle crew fixation and posterolateral arthrodesis with local autograft    PT Comments    Patient presented with improved mentation this session vs previous sessions. Pt is able to ambulate with min A with RW and max cues for posture. Continue to progress as tolerated with anticipated d/c to SNF for further skilled PT services.    Follow Up Recommendations  SNF     Equipment Recommendations  Other (comment)(TBD)    Recommendations for Other Services       Precautions / Restrictions Precautions Precautions: Fall;Back Precaution Booklet Issued: No Precaution Comments: Pt recalled 3/3 precautions Required Braces or Orthoses: Spinal Brace Spinal Brace: Lumbar corset;Applied in sitting position Restrictions Weight Bearing Restrictions: No    Mobility  Bed Mobility Overal bed mobility: Needs Assistance Bed Mobility: Rolling;Sit to Sidelying Rolling: Min guard       Sit to sidelying: Mod assist(to lift legs and maintain safe technique) General bed mobility comments: cues for sequencing and technique to maintain back precautions  Transfers Overall transfer level: Needs assistance Equipment used: Rolling walker (2 wheeled) Transfers: Sit to/from Stand Sit to Stand: Min assist         General transfer comment: cues for safe hand placement  Ambulation/Gait Ambulation/Gait assistance: Min assist Ambulation Distance (Feet): 65 Feet Assistive device: Rolling walker (2 wheeled) Gait Pattern/deviations: Step-through pattern;Decreased step length -  right;Decreased step length - left;Decreased dorsiflexion - right;Decreased dorsiflexion - left;Trunk flexed;Wide base of support Gait velocity: decreased   General Gait Details: max cues for posture and safe proximity to [employer]             Wheelchair Mobility    Modified Rankin (Stroke Patients Only)       Balance Overall balance assessment: Needs assistance Sitting-balance support: Feet supported;Bilateral upper extremity supported Sitting balance-Leahy Scale: Good     Standing balance support: Bilateral upper extremity supported Standing balance-Leahy Scale: Poor                              Cognition Arousal/Alertness: Awake/alert Behavior During Therapy: WFL for tasks assessed/performed Overall Cognitive Status: Impaired/Different from baseline Area of Impairment: Orientation                 Orientation Level: Time Current Attention Level: Sustained Memory: Decreased short-term memory Following Commands: Follows one step commands consistently Safety/Judgement: Decreased awareness of safety Awareness: Anticipatory Problem Solving: Requires verbal cues General Comments: pt able to state back precautions however needs cues for maintaining them throughout session      Exercises      General Comments General comments (skin integrity, edema, etc.): wife present during session      Pertinent Vitals/Pain Pain Assessment: Faces Faces Pain Scale: Hurts little more Pain Location: back Pain Descriptors / Indicators: Grimacing;Guarding;Sore Pain Intervention(s): Limited activity within patient's tolerance;Monitored during session;Repositioned    Home Living                      Prior Function  PT Goals (current goals can now be found in the care plan section) Acute Rehab PT Goals Patient Stated Goal: to get better PT Goal Formulation: With patient/family Time For Goal Achievement: 03/02/18 Potential to Achieve  Goals: Fair Progress towards PT goals: Progressing toward goals    Frequency    Min 3X/week      PT Plan Current plan remains appropriate    Co-evaluation              AM-PAC PT "6 Clicks" Daily Activity  Outcome Measure  Difficulty turning over in bed (including adjusting bedclothes, sheets and blankets)?: Unable Difficulty moving from lying on back to sitting on the side of the bed? : Unable Difficulty sitting down on and standing up from a chair with arms (e.g., wheelchair, bedside commode, etc,.)?: Unable Help needed moving to and from a bed to chair (including a wheelchair)?: A Little Help needed walking in hospital room?: A Little Help needed climbing 3-5 steps with a railing? : A Lot 6 Click Score: 11    End of Session Equipment Utilized During Treatment: Gait belt Activity Tolerance: Patient tolerated treatment well Patient left: with call bell/phone within reach;in bed;with family/visitor present Nurse Communication: Mobility status PT Visit Diagnosis: Pain;Other symptoms and signs involving the nervous system (R29.898) Pain - part of body: (back and bilat LE)     Time: 0943-1000 PT Time Calculation (min) (ACUTE ONLY): 17 min  Charges:  $Gait Training: 8-22 mins                    G Codes:       [name], PTA Pager: [phone]     Carolynne Edouard 02/21/2018, 2:04 PM

## 2018-02-21 NOTE — Clinical Social Work Placement (Signed)
Nurse to call report to [phone], ask for [address] Nurse; Room 601    CLINICAL SOCIAL WORK PLACEMENT  NOTE  Date:  02/21/2018  Patient Details  Name: Gary Thomas MRN: [id number] Date of Birth: 12/11/1931  Clinical Social Work is seeking post-discharge placement for this patient at the Skilled  Nursing Facility level of care (*CSW will initial, date and re-position this form in  chart as items are completed):  Yes   Patient/family provided with [facility] Clinical Social Work Department's list of facilities offering this level of care within the geographic area requested by the patient (or if unable, by the patient's family).  Yes   Patient/family informed of their freedom to choose among providers that offer the needed level of care, that participate in Medicare, Medicaid or managed care program needed by the patient, have an available bed and are willing to accept the patient.  Yes   Patient/family informed of [facility]'s ownership interest in [facility] and [facility], as well as of the fact that they are under no obligation to receive care at these facilities.  PASRR submitted to EDS on       PASRR number received on       Existing PASRR number confirmed on 02/16/18     FL2 transmitted to all facilities in geographic area requested by pt/family on 02/16/18     FL2 transmitted to all facilities within larger geographic area on       Patient informed that his/her managed care company has contracts with or will negotiate with certain facilities, including the following:        Yes   Patient/family informed of bed offers received.  Patient [facility] bed at [facility]     Physician recommends and patient chooses bed at      Patient to be transferred to [facility] on 02/21/18.  Patient to be transferred to facility by [facility]     Patient family notified on 02/21/18 of transfer.  Name of family member notified:  Wife     PHYSICIAN       Additional Comment:    _______________________________________________ Baldemar Lenis, LCSW 02/21/2018, 10:25 AM

## 2018-02-21 NOTE — Discharge Planning (Signed)
Called facility at 12:00 and gave report to [facility]. Pts iv was removed, site benign. Pt was stable leaving the floor. Pt left via ptar on stretcher with wife and son following.

## 2018-02-21 NOTE — Discharge Summary (Signed)
Physician Discharge Summary  Patient ID: Gary Thomas MRN: [id number] DOB/AGE: Oct 23, 1931 84 y.o.  Admit date: 02/15/2018 Discharge date: 02/21/2018  Admission Diagnoses:Lumbar stenosis with neurogenic claudication, lumbar spondylolisthesis, herniated lumbar disc, radiculopathy, lumbago L 3 - S 1 levels    Discharge Diagnoses: Lumbar stenosis with neurogenic claudication, lumbar spondylolisthesis, herniated lumbar disc, radiculopathy, lumbago L 3 - S 1 levels s/p Lumbar Three-Four,Lumbar Four-Five, Lumbar Five-Sacral One Posterior lumbar fusion. (N/A) decompression, pedicle crew fixation and posterolateral arthrodesis with local autograft     Active Problems:   Spondylolisthesis of lumbar region   Discharged Condition: good  Hospital Course: Gary Thomas was admitted for suregry with dx stenosis and radiculopathy. Following uncomplicated decompression and fusion L3-S1 levels, he recovered well and transferred to 3W for nursing care and therapies. He experienced a day of confusion and equired 2 units of blood for low Hgb and Nephrology was consulted for elevated creatinine and hx solitary kidney.  He recovered nicely and will benefit from SNF care for rehab.    Consults: nephrology  Significant Diagnostic Studies: radiology: X-Ray: intra-op  Treatments: surgery: Lumbar Three-Four,Lumbar Four-Five, Lumbar Five-Sacral One Posterior lumbar fusion. (N/A) decompression, pedicle crew fixation and posterolateral arthrodesis with local autograft    Discharge Exam: Blood pressure 126/68, pulse 71, temperature 97.7 F (36.5 C), temperature source Oral, resp. rate 20, height  (1.753 m), weight 98.9 kg (218 lb), SpO2 95 %. Awake, conversant. Oriented x3 today. Good strength BLE. Apparently has not walked over weekend. Required one dose of Norco yesterday for back pain. Improving labs.    Disposition: Discharge disposition: 03-Skilled Nursing Facility   Office f/u in 3-4 weeks.          Discharge Instructions    Diet - low sodium heart healthy   Complete by:  As directed    Diet - low sodium heart healthy   Complete by:  As directed    Increase activity slowly   Complete by:  As directed    Increase activity slowly   Complete by:  As directed      Allergies as of 02/21/2018      Reactions   Codeine Anaphylaxis   Morphine Anaphylaxis   Gabapentin Other (See Comments)   Unsure of what the reaction was   Stadol [butorphanol]    Unknown   Lyrica [pregabalin] Rash   Tapentadol Nausea Only   ONSET 01/04/2013      Medication List    STOP taking these medications   XARELTO 15 MG Tabs tablet Generic drug:  Rivaroxaban     TAKE these medications   atorvastatin 20 MG tablet Commonly known as:  LIPITOR Take 20 mg by mouth at bedtime.   beta carotene w/minerals tablet Take 1 tablet by mouth at bedtime.   diclofenac sodium 1 % Gel Commonly known as:  VOLTAREN Apply 1 application topically 4 (four) times daily as needed (for pain.).   furosemide 40 MG tablet Commonly known as:  LASIX Take 40 mg by mouth at bedtime.   HYDROcodone-acetaminophen 5-325 MG tablet Commonly known as:  NORCO/VICODIN Take 2 tablets by mouth every 4 (four) hours as needed for severe pain ((score 7 to 10)).   losartan 25 MG tablet Commonly known as:  COZAAR Take 25 mg by mouth at bedtime.   methocarbamol 500 MG tablet Commonly known as:  ROBAXIN Take 1 tablet (500 mg total) by mouth every 6 (six) hours as needed for muscle spasms.   metoprolol succinate 100 MG 24 hr  tablet Commonly known as:  TOPROL-XL Take 100 mg by mouth at bedtime.   NOVOLOG MIX 70/30 FLEXPEN (70-30) 100 UNIT/ML FlexPen Generic drug:  insulin aspart protamine - aspart Inject 25 Units into the skin 2 (two) times daily.   sertraline 100 MG tablet Commonly known as:  ZOLOFT Take 100 mg by mouth at bedtime.   tiotropium 18 MCG inhalation capsule Commonly  known as:  SPIRIVA HANDIHALER Place 1 capsule (18 mcg total) into inhaler and inhale daily.        Signed: Dorian Heckle, MD 02/21/2018, 8:34 AM

## 2018-02-21 NOTE — Progress Notes (Signed)
Occupational Therapy Treatment Patient Details Name: Gary Thomas MRN: [id number] DOB: 1932/06/21 Today's Date: 02/21/2018    History of present illness 83 year old man with  HNP, spondylosis, prior laminectomy at L 5 S 1 on the left, with spondylolisthesis,  stenosis, DDD, radiculopathy L 34, L 45, L 5 S 1 levels. Underwent Lumbar Three-Four,Lumbar Four-Five, Lumbar Five-Sacral One Posterior lumbar  fusion. (N/A) decompression, pedicle crew fixation and posterolateral arthrodesis with local autograft   OT comments  Pt performs sit to stand and functional mobility to the sink with use of the RW and min assist.  Oriented to place, and situation as well as day of the week and day of the month.  Was confused that it was the morning and not the pm, but no clock in the room to reference.  Mod instructional cueing to maintain back precautions with functional tasks.  Will continue to benefit from acute care OT for increased strengthening and DME education.  Still recommend SNF for follow-up rehab before home.    Follow Up Recommendations  SNF    Equipment Recommendations  None recommended by OT    Recommendations for Other Services      Precautions / Restrictions Precautions Precautions: Fall;Back Precaution Comments: Pt recalled 2/3 precautions Required Braces or Orthoses: Spinal Brace Spinal Brace: Lumbar corset;Applied in sitting position Restrictions Weight Bearing Restrictions: No       Mobility Bed Mobility                  Transfers Overall transfer level: Needs assistance Equipment used: Rolling walker (2 wheeled)   Sit to Stand: Min assist Stand pivot transfers: Min assist       General transfer comment: Pt needs mod demonstrational cueing for hand placement with transfers.     Balance Overall balance assessment: Needs assistance   Sitting balance-Leahy Scale: Good       Standing balance-Leahy Scale: Poor Standing balance comment: Pt needs use of UEs  on the walker for support with standing or functional mobility.                             ADL either performed or assessed with clinical judgement   ADL Overall ADL's : Needs assistance/impaired     Grooming: Wash/dry hands;Wash/dry face;Oral care;Supervision/safety;Sitting;Cueing for UE precautions           Upper Body Dressing : Sitting;Moderate assistance Upper Body Dressing Details (indicate cue type and reason): Mod assist for donning back brace and robe.     Toilet Transfer: Minimal assistance;Ambulation;RW   Toileting- Clothing Manipulation and Hygiene: Minimal assistance;Sit to/from stand       Functional mobility during ADLs: Minimal assistance;Rolling walker;Cueing for safety General ADL Comments: Pt exhibits flexed posture in standing during mobility with use of the RW.  Able to ambulate out of the room and up the hallway and back, approximately 50 ft.               Cognition Arousal/Alertness: Awake/alert Behavior During Therapy: WFL for tasks assessed/performed Overall Cognitive Status: Impaired/Different from baseline Area of Impairment: Orientation                 Orientation Level: Time Current Attention Level: Sustained Memory: Decreased recall of precautions(Able to recall 2/3 precautions) Following Commands: Follows one step commands consistently Safety/Judgement: Decreased awareness of safety Awareness: Anticipatory Problem Solving: Requires verbal cues General Comments: Pt needed cueing from therapist to not reach down to  his feet to adjust his socks secondary to back precautions.                    Pertinent Vitals/ Pain       Pain Assessment: Faces Faces Pain Scale: Hurts little more Pain Location: back Pain Descriptors / Indicators: Grimacing;Guarding Pain Intervention(s): Limited activity within patient's tolerance;Monitored during session         Frequency  Min 2X/week           Plan Discharge plan  remains appropriate       AM-PAC PT "6 Clicks" Daily Activity     Outcome Measure   Help from another person eating meals?: None Help from another person taking care of personal grooming?: A Little Help from another person toileting, which includes using toliet, bedpan, or urinal?: A Little Help from another person bathing (including washing, rinsing, drying)?: A Lot Help from another person to put on and taking off regular upper body clothing?: A Little Help from another person to put on and taking off regular lower body clothing?: A Lot 6 Click Score: 17    End of Session Equipment Utilized During Treatment: Gait belt;Rolling walker;Back brace  OT Visit Diagnosis: Unsteadiness on feet (R26.81);Muscle weakness (generalized) (M62.81);Pain Pain - part of body: (back)   Activity Tolerance Patient tolerated treatment well   Patient Left in chair;with call bell/phone within reach;with chair alarm set   Nurse Communication Mobility status        Time: 1610-9604 OT Time Calculation (min): 31 min  Charges: OT General Charges $OT Visit: 1 Visit OT Treatments $Self Care/Home Management : 23-37 mins    Arnold Depinto OTR/L 02/21/2018, 8:52 AM

## 2018-02-21 NOTE — Progress Notes (Addendum)
Subjective: Patient reports "I'm good" "Just a little sore in my back. The hydrocodone helped more yesterday than the Tylenol"  Objective: Vital signs in last 24 hours: Temp:  [97.7 F (36.5 C)-98.3 F (36.8 C)] 97.7 F (36.5 C) (05/13 0506) Pulse Rate:  [71-89] 71 (05/13 0506) Resp:  [18-20] 20 (05/13 0506) BP: (126-156)/(42-68) 126/68 (05/13 0506) SpO2:  [93 %-100 %] 95 % (05/13 0506)  Intake/Output from previous day: 05/12 0701 - 05/13 0700 In: 723 [P.O.:720; I.V.:3] Out: 1225 [Urine:1225] Intake/Output this shift: No intake/output data recorded.  Awake, conversant. Oriented x3 today. Good strength BLE. Apparently has not walked over weekend. Required one dose of Norco yesterday for back pain. Improving labs.   Lab Results: Recent Labs    02/19/18 0315  WBC 10.0  HGB 7.7*  HCT 25.4*  PLT 131*   BMET Recent Labs    02/19/18 0315 02/20/18 0450  NA 142 143  K 4.6 4.2  CL 112* 110  CO2 22 27  GLUCOSE 100* 100*  BUN 40* 33*  CREATININE 2.45* 1.93*  CALCIUM 8.3* 8.4*    Studies/Results: No results found.  Assessment/Plan: improving  LOS: 6 days  Appreciate Nephrology's assistance. Per Dr. Venetia Maxon, ok to d/c to SNF. Office f/u in 3-4 weeks.    Georgiann Cocker 02/21/2018, 7:53 AM   Patient progressing well.  Discharge today to SNF.

## 2018-07-07 ENCOUNTER — Encounter (INDEPENDENT_AMBULATORY_CARE_PROVIDER_SITE_OTHER): Payer: Medicare Other

## 2018-07-07 ENCOUNTER — Ambulatory Visit (INDEPENDENT_AMBULATORY_CARE_PROVIDER_SITE_OTHER): Payer: Medicare Other | Admitting: Vascular Surgery

## 2018-08-09 ENCOUNTER — Other Ambulatory Visit: Payer: Self-pay | Admitting: Orthopedic Surgery

## 2018-08-09 DIAGNOSIS — M4848XA Fatigue fracture of vertebra, sacral and sacrococcygeal region, initial encounter for fracture: Secondary | ICD-10-CM

## 2018-08-18 ENCOUNTER — Ambulatory Visit
Admission: RE | Admit: 2018-08-18 | Discharge: 2018-08-18 | Disposition: A | Discharge status: Home / Self Care | Payer: Medicare Other | Source: Ambulatory Visit | Attending: Orthopedic Surgery | Admitting: Orthopedic Surgery

## 2018-08-18 ENCOUNTER — Encounter
Admission: RE | Admit: 2018-08-18 | Discharge: 2018-08-18 | Disposition: A | Discharge status: Home / Self Care | Payer: Medicare Other | Source: Ambulatory Visit | Attending: Orthopedic Surgery | Admitting: Orthopedic Surgery

## 2018-08-18 DIAGNOSIS — M4848XA Fatigue fracture of vertebra, sacral and sacrococcygeal region, initial encounter for fracture: Secondary | ICD-10-CM | POA: Diagnosis not present

## 2018-08-18 MED ORDER — TECHNETIUM TC 99M MEDRONATE IV KIT
22.6500 | PACK | Freq: Once | INTRAVENOUS | Status: AC | PRN
  Administered 2018-08-18: 22.65 via INTRAVENOUS

## 2018-08-22 ENCOUNTER — Inpatient Hospital Stay
Admission: EM | Admit: 2018-08-22 | Discharge: 2018-08-26 | DRG: 481 | Disposition: A | Discharge status: Skilled Nursing Facility | Payer: Medicare Other | Attending: Internal Medicine | Admitting: Internal Medicine

## 2018-08-22 ENCOUNTER — Emergency Department: Payer: Medicare Other

## 2018-08-22 ENCOUNTER — Encounter: Payer: Self-pay | Admitting: Emergency Medicine

## 2018-08-22 DIAGNOSIS — E785 Hyperlipidemia, unspecified: Secondary | ICD-10-CM | POA: Diagnosis present

## 2018-08-22 DIAGNOSIS — Z9842 Cataract extraction status, left eye: Secondary | ICD-10-CM

## 2018-08-22 DIAGNOSIS — Z8673 Personal history of transient ischemic attack (TIA), and cerebral infarction without residual deficits: Secondary | ICD-10-CM | POA: Diagnosis not present

## 2018-08-22 DIAGNOSIS — Z86711 Personal history of pulmonary embolism: Secondary | ICD-10-CM

## 2018-08-22 DIAGNOSIS — R0689 Other abnormalities of breathing: Secondary | ICD-10-CM

## 2018-08-22 DIAGNOSIS — D62 Acute posthemorrhagic anemia: Secondary | ICD-10-CM | POA: Diagnosis not present

## 2018-08-22 DIAGNOSIS — Z9049 Acquired absence of other specified parts of digestive tract: Secondary | ICD-10-CM

## 2018-08-22 DIAGNOSIS — K219 Gastro-esophageal reflux disease without esophagitis: Secondary | ICD-10-CM | POA: Diagnosis present

## 2018-08-22 DIAGNOSIS — Z7901 Long term (current) use of anticoagulants: Secondary | ICD-10-CM

## 2018-08-22 DIAGNOSIS — Q600 Renal agenesis, unilateral: Secondary | ICD-10-CM | POA: Diagnosis not present

## 2018-08-22 DIAGNOSIS — E1122 Type 2 diabetes mellitus with diabetic chronic kidney disease: Secondary | ICD-10-CM | POA: Diagnosis present

## 2018-08-22 DIAGNOSIS — D631 Anemia in chronic kidney disease: Secondary | ICD-10-CM | POA: Diagnosis present

## 2018-08-22 DIAGNOSIS — Z885 Allergy status to narcotic agent status: Secondary | ICD-10-CM | POA: Diagnosis not present

## 2018-08-22 DIAGNOSIS — I129 Hypertensive chronic kidney disease with stage 1 through stage 4 chronic kidney disease, or unspecified chronic kidney disease: Secondary | ICD-10-CM | POA: Diagnosis present

## 2018-08-22 DIAGNOSIS — S72451A Displaced supracondylar fracture without intracondylar extension of lower end of right femur, initial encounter for closed fracture: Secondary | ICD-10-CM | POA: Diagnosis present

## 2018-08-22 DIAGNOSIS — F329 Major depressive disorder, single episode, unspecified: Secondary | ICD-10-CM | POA: Diagnosis present

## 2018-08-22 DIAGNOSIS — M979XXA Periprosthetic fracture around unspecified internal prosthetic joint, initial encounter: Principal | ICD-10-CM | POA: Diagnosis present

## 2018-08-22 DIAGNOSIS — Z961 Presence of intraocular lens: Secondary | ICD-10-CM | POA: Diagnosis present

## 2018-08-22 DIAGNOSIS — X58XXXA Exposure to other specified factors, initial encounter: Secondary | ICD-10-CM | POA: Diagnosis present

## 2018-08-22 DIAGNOSIS — Z794 Long term (current) use of insulin: Secondary | ICD-10-CM

## 2018-08-22 DIAGNOSIS — N183 Chronic kidney disease, stage 3 (moderate): Secondary | ICD-10-CM | POA: Diagnosis present

## 2018-08-22 DIAGNOSIS — N179 Acute kidney failure, unspecified: Secondary | ICD-10-CM | POA: Diagnosis present

## 2018-08-22 DIAGNOSIS — Z8249 Family history of ischemic heart disease and other diseases of the circulatory system: Secondary | ICD-10-CM

## 2018-08-22 DIAGNOSIS — S7290XA Unspecified fracture of unspecified femur, initial encounter for closed fracture: Secondary | ICD-10-CM

## 2018-08-22 DIAGNOSIS — Y92009 Unspecified place in unspecified non-institutional (private) residence as the place of occurrence of the external cause: Secondary | ICD-10-CM | POA: Diagnosis not present

## 2018-08-22 DIAGNOSIS — W010XXA Fall on same level from slipping, tripping and stumbling without subsequent striking against object, initial encounter: Secondary | ICD-10-CM | POA: Diagnosis present

## 2018-08-22 DIAGNOSIS — S72491A Other fracture of lower end of right femur, initial encounter for closed fracture: Secondary | ICD-10-CM

## 2018-08-22 DIAGNOSIS — S72453A Displaced supracondylar fracture without intracondylar extension of lower end of unspecified femur, initial encounter for closed fracture: Secondary | ICD-10-CM

## 2018-08-22 DIAGNOSIS — Z419 Encounter for procedure for purposes other than remedying health state, unspecified: Secondary | ICD-10-CM

## 2018-08-22 DIAGNOSIS — W19XXXA Unspecified fall, initial encounter: Secondary | ICD-10-CM

## 2018-08-22 DIAGNOSIS — Z7902 Long term (current) use of antithrombotics/antiplatelets: Secondary | ICD-10-CM

## 2018-08-22 DIAGNOSIS — G4730 Sleep apnea, unspecified: Secondary | ICD-10-CM | POA: Diagnosis present

## 2018-08-22 DIAGNOSIS — Z888 Allergy status to other drugs, medicaments and biological substances status: Secondary | ICD-10-CM | POA: Diagnosis not present

## 2018-08-22 DIAGNOSIS — E860 Dehydration: Secondary | ICD-10-CM | POA: Diagnosis present

## 2018-08-22 DIAGNOSIS — Z9841 Cataract extraction status, right eye: Secondary | ICD-10-CM | POA: Diagnosis not present

## 2018-08-22 DIAGNOSIS — Z823 Family history of stroke: Secondary | ICD-10-CM | POA: Diagnosis not present

## 2018-08-22 DIAGNOSIS — Z96641 Presence of right artificial hip joint: Secondary | ICD-10-CM | POA: Diagnosis present

## 2018-08-22 DIAGNOSIS — T465X5A Adverse effect of other antihypertensive drugs, initial encounter: Secondary | ICD-10-CM | POA: Diagnosis present

## 2018-08-22 DIAGNOSIS — E875 Hyperkalemia: Secondary | ICD-10-CM | POA: Diagnosis not present

## 2018-08-22 DIAGNOSIS — Z79899 Other long term (current) drug therapy: Secondary | ICD-10-CM

## 2018-08-22 HISTORY — DX: Unspecified fracture of unspecified femur, initial encounter for closed fracture: S72.90XA

## 2018-08-22 LAB — CBC WITH DIFFERENTIAL/PLATELET
Abs Immature Granulocytes: 0.1 10*3/uL — ABNORMAL HIGH (ref 0.00–0.07)
BASOS ABS: 0.1 10*3/uL (ref 0.0–0.1)
BASOS PCT: 1 %
EOS ABS: 0.2 10*3/uL (ref 0.0–0.5)
EOS PCT: 2 %
HEMATOCRIT: 33.3 % — AB (ref 39.0–52.0)
Hemoglobin: 9.8 g/dL — ABNORMAL LOW (ref 13.0–17.0)
IMMATURE GRANULOCYTES: 1 %
LYMPHS ABS: 1.6 10*3/uL (ref 0.7–4.0)
Lymphocytes Relative: 13 %
MCH: 24.1 pg — ABNORMAL LOW (ref 26.0–34.0)
MCHC: 29.4 g/dL — AB (ref 30.0–36.0)
MCV: 82 fL (ref 80.0–100.0)
Monocytes Absolute: 1 10*3/uL (ref 0.1–1.0)
Monocytes Relative: 8 %
NEUTROS PCT: 75 %
NRBC: 0 % (ref 0.0–0.2)
Neutro Abs: 9.8 10*3/uL — ABNORMAL HIGH (ref 1.7–7.7)
PLATELETS: 238 10*3/uL (ref 150–400)
RBC: 4.06 MIL/uL — ABNORMAL LOW (ref 4.22–5.81)
RDW: 15.8 % — AB (ref 11.5–15.5)
WBC: 12.9 10*3/uL — ABNORMAL HIGH (ref 4.0–10.5)

## 2018-08-22 LAB — COMPREHENSIVE METABOLIC PANEL
ALK PHOS: 81 U/L (ref 38–126)
ALT: 15 U/L (ref 0–44)
ANION GAP: 6 (ref 5–15)
AST: 26 U/L (ref 15–41)
Albumin: 3.7 g/dL (ref 3.5–5.0)
BUN: 30 mg/dL — ABNORMAL HIGH (ref 8–23)
CALCIUM: 8.9 mg/dL (ref 8.9–10.3)
CO2: 26 mmol/L (ref 22–32)
CREATININE: 1.59 mg/dL — AB (ref 0.61–1.24)
Chloride: 105 mmol/L (ref 98–111)
GFR calc non Af Amer: 38 mL/min — ABNORMAL LOW (ref >60)
GFR, EST AFRICAN AMERICAN: 44 mL/min — AB (ref >60)
Glucose, Bld: 163 mg/dL — ABNORMAL HIGH (ref 70–99)
Potassium: 5 mmol/L (ref 3.5–5.1)
SODIUM: 137 mmol/L (ref 135–145)
TOTAL PROTEIN: 6.8 g/dL (ref 6.5–8.1)
Total Bilirubin: 0.6 mg/dL (ref 0.3–1.2)

## 2018-08-22 MED ORDER — OXYCODONE-ACETAMINOPHEN 5-325 MG PO TABS
1.0000 | ORAL_TABLET | Freq: Once | ORAL | Status: AC
Start: 2018-08-22 — End: 2018-08-22
  Administered 2018-08-22: 1 via ORAL
  Filled 2018-08-22: qty 1

## 2018-08-22 MED ORDER — FENTANYL CITRATE (PF) 100 MCG/2ML IJ SOLN
100.0000 ug | Freq: Once | INTRAMUSCULAR | Status: AC
  Administered 2018-08-22: 100 ug via INTRAVENOUS
  Filled 2018-08-22: qty 2

## 2018-08-22 MED ORDER — ONDANSETRON 8 MG PO TBDP
8.0000 mg | ORAL_TABLET | Freq: Once | ORAL | Status: AC
  Administered 2018-08-22: 8 mg via ORAL
  Filled 2018-08-22: qty 1

## 2018-08-22 MED ORDER — SODIUM CHLORIDE 0.9 % IV BOLUS
1000.0000 mL | Freq: Once | INTRAVENOUS | Status: AC
  Administered 2018-08-22: 1000 mL via INTRAVENOUS

## 2018-08-22 MED ORDER — FENTANYL CITRATE (PF) 100 MCG/2ML IJ SOLN
100.0000 ug | INTRAMUSCULAR | Status: DC | PRN
  Administered 2018-08-22: 100 ug via INTRAVENOUS
  Filled 2018-08-22: qty 2

## 2018-08-22 NOTE — ED Provider Notes (Signed)
[facility] Emergency Department Provider Note  ____________________________________________  Time seen: Approximately 7:20 PM  I have reviewed the triage vital signs and the nursing notes.   HISTORY  Chief Complaint Hip Pain and Knee Pain    HPI Gary Thomas is a 84 y.o. male who presents the emergency department complaining of bilateral hip, bilateral knee pain.  Patient reports that several days ago, he was in the hospital as an outpatient having imaging of his abdomen and back.  Patient reports that at that time, his left leg collapsed underneath him, causing him to fall.  Patient reports that over the weekend he was in too much pain to get out of bed.  Today was the first day he felt like getting out of bed.  While someone else was in the home with him, patient uses walker to ambulate from his bedroom to the living room to eat.  Patient reports that after eating, he attempted to stand up and return to his bedroom when his right leg gave out from underneath him.  Patient reports that he fell on his right side.  He did not hit his head or lose consciousness either fall.  Patient is complaining of bilateral knee, bilateral hip pain.  Patient reports that he heard a "pop/crack" sound during the fall today too his right knee.   Patient was transported by EMS.  They do not report any shortening or rotating of the right or left lower extremity.  Of note, patient has had the right hip and right knee replaced.  Patient has had no medications for pain.  No other injury or complaint at this time.  Patient has chronic medical problems to include diabetes, GERD, hypertension, renal insufficiency, sleep apnea, stroke.  No complaints of chronic medical problems.   Past Medical History:  Diagnosis Date  . Depression   . Diabetes mellitus without complication (HCC)   . Dyspnea   . Elevated lipids   . GERD (gastroesophageal reflux disease)   . Hypertension   . Pulmonary  emboli (HCC)   . Renal insufficiency    only has 1 kidney  . Sleep apnea   . Stroke (HCC)    Rt side weakness    Patient Active Problem List   Diagnosis Date Noted  . Spondylolisthesis of lumbar region 02/15/2018  . PAD (peripheral artery disease) (HCC) 08/19/2017  . Hyperlipidemia 07/05/2017  . Essential hypertension 07/05/2017  . Bilateral carotid artery stenosis 07/05/2017  . Primary localized osteoarthritis of right hip 03/02/2017    Past Surgical History:  Procedure Laterality Date  . 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 AORTA SURGERY    . CAROTID ARTERY - SUBCLAVIAN ARTERY BYPASS GRAFT Bilateral   . CATARACT EXTRACTION W/ INTRAOCULAR LENS  IMPLANT, BILATERAL    . CHOLECYSTECTOMY    . EYE SURGERY    . FEMUR FRACTURE SURGERY Right   . HIP FRACTURE SURGERY Bilateral   . JOINT REPLACEMENT    . PROSTATE SURGERY    . REPLACEMENT TOTAL KNEE Right   . TOTAL HIP ARTHROPLASTY Right 03/02/2017   Procedure: TOTAL HIP ARTHROPLASTY ANTERIOR APPROACH;  Surgeon: Kennedy Bucker, MD;  Location: [facility] ORS;  Service: Orthopedics;  Laterality: Right;  Marland Kitchen VASCULAR SURGERY      Prior to Admission medications   Medication Sig Start Date End Date Taking? Authorizing Provider  atorvastatin (LIPITOR) 20 MG tablet Take 20 mg by mouth at bedtime.  02/02/17  [provider]  beta carotene w/minerals (OCUVITE) tablet Take 1 tablet by mouth at bedtime.     [provider]  diclofenac sodium (VOLTAREN) 1 % GEL Apply 1 application topically 4 (four) times daily as needed (for pain.).  03/24/17   [provider]  furosemide (LASIX) 40 MG tablet Take 40 mg by mouth at bedtime.  02/09/17   [provider]  HYDROcodone-acetaminophen (NORCO/VICODIN) 5-325 MG tablet Take 2 tablets by mouth every 4 (four) hours as needed for severe pain ((score 7 to 10)). 02/21/18   Maeola Harman, MD  losartan (COZAAR) 25 MG tablet Take 25 mg by mouth at  bedtime.  01/14/17   [provider]  methocarbamol (ROBAXIN) 500 MG tablet Take 1 tablet (500 mg total) by mouth every 6 (six) hours as needed for muscle spasms. 02/21/18   Maeola Harman, MD  metoprolol succinate (TOPROL-XL) 100 MG 24 hr tablet Take 100 mg by mouth at bedtime.  02/12/16   [provider]  NOVOLOG MIX 70/30 FLEXPEN (70-30) 100 UNIT/ML FlexPen Inject 25 Units into the skin 2 (two) times daily. 11/20/16   [provider]  sertraline (ZOLOFT) 100 MG tablet Take 100 mg by mouth at bedtime.  01/23/17   [provider]  tiotropium (SPIRIVA HANDIHALER) 18 MCG inhalation capsule Place 1 capsule (18 mcg total) into inhaler and inhale daily. Patient not taking: Reported on 02/02/2018 04/07/17 04/07/18  Shane Crutch, MD    Allergies Codeine; Morphine; Gabapentin; Stadol [butorphanol]; Lyrica [pregabalin]; and Tapentadol  Family History  Problem Relation Age of Onset  . Heart attack Father   . Stroke Father   . Heart disease Father     Social History Social History   Tobacco Use  . Smoking status: Former Smoker    Last attempt to quit: 02/18/1952    Years since quitting: 66.5  . Smokeless tobacco: Former Engineer, water Use Topics  . Alcohol use: No  . Drug use: No     Review of Systems  Constitutional: No fever/chills Eyes: No visual changes.  Cardiovascular: no chest pain. Respiratory: no cough. No SOB. Gastrointestinal: No abdominal pain.  No nausea, no vomiting.  No diarrhea.  No constipation. Genitourinary: Negative for dysuria. No hematuria Musculoskeletal: Positive for bilateral hip, bilateral knee pain. Skin: Negative for rash, abrasions, lacerations, ecchymosis. Neurological: Negative for headaches, focal weakness or numbness. 10-point ROS otherwise negative.  ____________________________________________   PHYSICAL EXAM:  VITAL SIGNS: ED Triage Vitals  Enc Vitals Group     BP 08/22/18 1914 (!) 176/84     Pulse Rate  08/22/18 1914 (!) 106     Resp 08/22/18 1914 18     Temp 08/22/18 1914 98.2 F (36.8 C)     Temp Source 08/22/18 1914 Oral     SpO2 08/22/18 1914 96 %     Weight 08/22/18 1913 212 lb (96.2 kg)     Height 08/22/18 1913 5\' 9"  (1.753 m)     Head Circumference --      Peak Flow --      Pain Score 08/22/18 1918 6     Pain Loc --      Pain Edu? --      Excl. in GC? --      Constitutional: Alert and oriented. Well appearing and in no acute distress. Eyes: Conjunctivae are normal. PERRL. EOMI. Head: Atraumatic. Neck: No stridor.    Cardiovascular: Normal rate, regular rhythm. Normal S1 and S2.  Good peripheral circulation. Respiratory:  Normal respiratory effort without tachypnea or retractions. Lungs CTAB. Good air entry to the bases with no decreased or absent breath sounds. Musculoskeletal: Limited range of motion to bilateral lower extremities due to pain.  No gross deformities appreciated.  No gross shortening or rotation of either lower extremity.  Visualization of the lumbar spine reveals no visible signs of trauma.  No abrasions or lacerations or ecchymosis.  No deformity.  Patient is tender to palpation over L5-S1.  No palpable abnormality or step-off.  Patient is nontender to palpation over the posterior iliac crest or SI joints.  Tender to palpation over right lateral hip in the superior femur/trochanteric region.  No palpable abnormality.  No range of motion testing performed prior to imaging.  Patient is tender to palpation in the same region of the left side as well.  No palpable abnormality of range of motion performed the left side either.  No tenderness to palpation mid femur bilaterally.  Examination of the right knee reveals mild to moderate edema when compared with left.  No abrasions or lacerations noted to the right knee.  No range of motion performed prior to imaging.  Patient is very tender to palpation over the patella, lateral joint line, tibial plateau.  No palpable  abnormality or deficit in this region.  No other tenderness to palpation over the right knee.  Examination of the right ankle is unremarkable.  Examination of the left knee reveals superficial abrasion, mild ecchymosis.  Patient reports that this occurred from previous fall several days prior.  No range of motion testing prior to imaging.  Patient is also tender to palpation of the patella in this region.  No other tenderness to palpation left knee.  No palpable abnormality.  Examination of the left ankle reveals abrasion, ecchymosis from previous fall.  No tenderness.  Dorsalis pedis pulse intact bilateral lower extremities.  Sensation intact and equal bilateral lower extremities. Neurologic:  Normal speech and language. No gross focal neurologic deficits are appreciated.  Skin:  Skin is warm, dry and intact. No rash noted. Psychiatric: Mood and affect are normal. Speech and behavior are normal. Patient exhibits appropriate insight and judgement.   ____________________________________________   LABS (all labs ordered are listed, but only abnormal results are displayed)  Labs Reviewed  COMPREHENSIVE METABOLIC PANEL - Abnormal; Notable for the following components:      Result Value   Glucose, Bld 163 (*)    BUN 30 (*)    Creatinine, Ser 1.59 (*)    GFR calc non Af Amer 38 (*)    GFR calc Af Amer 44 (*)    All other components within normal limits  CBC WITH DIFFERENTIAL/PLATELET - Abnormal; Notable for the following components:   WBC 12.9 (*)    RBC 4.06 (*)    Hemoglobin 9.8 (*)    HCT 33.3 (*)    MCH 24.1 (*)    MCHC 29.4 (*)    RDW 15.8 (*)    Neutro Abs 9.8 (*)    Abs Immature Granulocytes 0.10 (*)    All other components within normal limits   ____________________________________________  EKG   ____________________________________________  RADIOLOGY I personally viewed and evaluated these images as part of my medical decision making, as well as reviewing the written report  by the radiologist.  Dg Chest 1 View  Result Date: 08/22/2018 CLINICAL DATA:  Preoperative radiograph EXAM: CHEST  1 VIEW COMPARISON:  None. FINDINGS: The heart size and mediastinal contours are within normal limits. Both  lungs are clear. The visualized skeletal structures are unremarkable. IMPRESSION: No active disease. Electronically Signed   By: Deatra Robinson M.D.   On: 08/22/2018 21:27   Dg Lumbar Spine Complete  Result Date: 08/22/2018 CLINICAL DATA:  Pain following fall EXAM: LUMBAR SPINE - COMPLETE 4+ VIEW COMPARISON:  March 21, 2018 FINDINGS: Frontal, lateral, spot lumbosacral lateral, and bilateral oblique views were obtained. There are 5 non-rib-bearing lumbar type vertebral bodies. There are multiple postoperative laminectomy defects throughout the lumbar region. The patient is status post posterior screw and plate fixation at L3, L4, L5, and S1 bilaterally. Screw tips are in respective vertebral bodies. There is no acute fracture or spondylolisthesis. The disc spaces appear normal. There is no appreciable facet arthropathy. There is aortic atherosclerosis. There are surgical clips in the left abdomen posteriorly. IMPRESSION: Postoperative change at multiple levels. No fracture or spondylolisthesis. No appreciable disc space narrowing. There is aortic atherosclerosis. Aortic Atherosclerosis (ICD10-I70.0). Electronically Signed   By: Bretta Bang III M.D.   On: 08/22/2018 20:19   Ct Hip Left Wo Contrast  Result Date: 08/22/2018 CLINICAL DATA:  Rule out left hip fracture based on x-ray. EXAM: CT OF THE LEFT HIP WITHOUT CONTRAST TECHNIQUE: Multidetector CT imaging of the left hip was performed according to the standard protocol. Multiplanar CT image reconstructions were also generated. COMPARISON:  None. FINDINGS: No fracture or dislocation. Linear sclerosis through the femoral neck is consistent with previous instrumentation. IMPRESSION: No fracture or dislocation. Electronically Signed    By: Gerome Sam III M.D   On: 08/22/2018 22:12   Dg Knee Complete 4 Views Left  Result Date: 08/22/2018 CLINICAL DATA:  Pain after fall EXAM: LEFT KNEE - COMPLETE 4+ VIEW COMPARISON:  None. FINDINGS: Vascular calcifications. Mild patellofemoral compartment degenerative changes. No acute fractures identified. No joint effusion. IMPRESSION: Negative. Electronically Signed   By: Gerome Sam III M.D   On: 08/22/2018 20:23   Dg Knee Complete 4 Views Right  Result Date: 08/22/2018 CLINICAL DATA:  Pain after fall EXAM: RIGHT KNEE - COMPLETE 4+ VIEW COMPARISON:  None. FINDINGS: The patient is status post knee replacement. There is a fracture through the distal femur extending to the level of the femoral component. The fracture is comminuted as seen on the third image. Apparent callus formation in the mid femur may be from previous fracture. No other acute abnormalities identified. IMPRESSION: Comminuted distal femoral fracture extending into the femoral component of the knee replacement. Electronically Signed   By: Gerome Sam III M.D   On: 08/22/2018 20:29   Dg Hip Unilat W Or Wo Pelvis 2-3 Views Left  Result Date: 08/22/2018 CLINICAL DATA:  Pain after fall EXAM: DG HIP (WITH OR WITHOUT PELVIS) 2-3V LEFT COMPARISON:  None. FINDINGS: Linear high attenuation in the femoral neck is likely from prior instrumentation. Lucency through the greater trochanter on the third image is noted. No other acute abnormalities. Previous right hip replacement. IMPRESSION: 1. Subtle linear lucency through the greater trochanter on only one view. A subtle nondisplaced fracture is not excluded. CT imaging could better evaluate. No other abnormalities. Electronically Signed   By: Gerome Sam III M.D   On: 08/22/2018 20:26   Dg Hip Unilat W Or Wo Pelvis 2-3 Views Right  Result Date: 08/22/2018 CLINICAL DATA:  PAIN FOLLOWING FALL EXAM: DG HIP (WITH OR WITHOUT PELVIS) BILATERALLY: 4+ VIEWS COMPARISON:  PELVIS AND  RIGHT HIP May 29, 2017 FINDINGS: Frontal pelvis as well as frontal and lateral views of each  hip joint obtained. There is no appreciable fracture or dislocation. There is evidence of prior postoperative change on the left, stable. There is a total hip replacement on the right with prosthetic components well-seated. There is mild narrowing of the left hip joint. No erosive changes. There are surgical clips in the pelvis. There is postoperative change in the lower lumbar spine and upper sacrum. There are foci of arterial vascular calcification. IMPRESSION: No acute fracture or dislocation. Postoperative change on the left with stable appearance. Total hip replacement on the right with prosthetic components well-seated. Mild narrowing left hip joint. Postoperative change lower lumbar spine and upper sacrum. Foci of arterial vascular calcification noted. Electronically Signed   By: Bretta Bang III M.D.   On: 08/22/2018 20:23    ____________________________________________    PROCEDURES  Procedure(s) performed:    .Splint Application Date/Time: 08/22/2018 10:49 PM Performed by: Racheal Patches, PA-C Authorized by: Racheal Patches, PA-C   Consent:    Consent obtained:  Verbal   Consent given by:  Patient   Risks discussed:  Pain and swelling Pre-procedure details:    Sensation:  Normal Procedure details:    Laterality:  Right   Location:  Knee   Knee:  R knee   Splint type:  Knee immobilizer   Supplies:  Prefabricated splint Post-procedure details:    Pain:  Unchanged   Sensation:  Normal   Patient tolerance of procedure:  Tolerated well, no immediate complications      Medications  oxyCODONE-acetaminophen (PERCOCET/ROXICET) 5-325 MG per tablet 1 tablet (1 tablet Oral Given 08/22/18 1928)  ondansetron (ZOFRAN-ODT) disintegrating tablet 8 mg (8 mg Oral Given 08/22/18 1928)  sodium chloride 0.9 % bolus 1,000 mL (1,000 mLs Intravenous New Bag/Given 08/22/18 2122)   fentaNYL (SUBLIMAZE) injection 100 mcg (100 mcg Intravenous Given 08/22/18 2122)  fentaNYL (SUBLIMAZE) injection 100 mcg (100 mcg Intravenous Given 08/22/18 2240)     ____________________________________________   INITIAL IMPRESSION / ASSESSMENT AND PLAN / ED COURSE  Pertinent labs & imaging results that were available during my care of the patient were reviewed by me and considered in my medical decision making (see chart for details).  Review of the [place] CSRS was performed in accordance of the [facility] prior to dispensing any controlled drugs.  Clinical Course as of Aug 22 2249  Mon Aug 22, 2018  2118 Patient presented to the emergency department for 2 falls over the period of 3 days.  Patient was complaining of bilateral hip pain, bilateral knee pain.  Concern on initial exam was for hip fracture, femur fracture, ligamentous injury, pelvic fracture.  Initial imaging returns with lucency of the left hip is concerning for fracture.  This will be further investigated with CT scan at this time.  Patient also has a comminuted distal right femur fracture extending into the knee replacement.  At this time, awaiting CT scan prior to consultation with orthopedics.   [JC]    Clinical Course User Index [JC] , Delorise Royals, PA-C       Patient's diagnosis is consistent with femur fracture sustained after fall.  Patient presents emergency department with bilateral hip, bilateral knee pain after a fall.  Patient sustained 2 falls in the last 3 days.  First fall occurred, landing on the left lower extremity.  Patient has had left groin and hip pain since this occurrence.  He has been unable to ambulate on the hip given the pain.  Tonight was the first night patient felt able to  ambulate with the assistance of a walker.  Patient reports that he fell landing on his right knee.  Patient felt a sudden sharp pain and heard a pop to the right knee.  Patient was transported via EMS.  On exam, patient has  significant tenderness over bilateral hips, bilateral knees.  Initial x-rays revealed distal femur fracture that extends into knee replacement on the right side.  On the left hip, questionable hip fracture.  CT scan of the left hip reveals no defined fracture.  I discussed the patient with on-call orthopedic surgeon, Dr. Joice Lofts who advises that at this time the best course of action is to immobilize, treat conservatively.  Patient is unable to ambulate on her right extremity given fracture, left extremity given pain.  Patient has been in significant amounts of pain throughout his stay and Percocet, 2 doses of fentanyl were unable to alleviate pain.  Orthopedics advises admission for pain management, placement.  Orthopedics will follow patient while admitted.  Discussed the patient with hospitalist service who agrees to admit patient.  Patient care will be transferred to Dr Caryn Bee for admission..     ____________________________________________  FINAL CLINICAL IMPRESSION(S) / ED DIAGNOSES  Final diagnoses:  Other closed fracture of distal end of right femur, initial encounter (HCC)  Fall in home, initial encounter      NEW MEDICATIONS STARTED DURING THIS VISIT:  ED Discharge Orders    None          This chart was dictated using voice recognition software/Dragon. Despite best efforts to proofread, errors can occur which can change the meaning. Any change was purely unintentional.    Racheal Patches, PA-C 08/22/18 2250    Myrna Blazer, MD 08/22/18 2329

## 2018-08-22 NOTE — ED Triage Notes (Signed)
Pt via ems from home with right hip and knee pain falling a mechanical fall when his "knees gave out" and he fell while walking on his walker. Both hip and knee have been replaced. He states he felt a "pop" in his right knee when he fell.

## 2018-08-22 NOTE — ED Notes (Signed)
Pt moved to 5hallway bed.  Pt waiting on admission.

## 2018-08-22 NOTE — ED Notes (Signed)
Patient reports a previous fall several days ago that has caused him to be more bed bound than usual.  Patient reports a second fall today. Patient is complaining of right knee pain.

## 2018-08-22 NOTE — ED Notes (Signed)
Report off to brittany rn.  Pt placed on 3 liters oxygen.  Pain meds given to pt.  Family with pt

## 2018-08-23 ENCOUNTER — Other Ambulatory Visit: Payer: Self-pay

## 2018-08-23 ENCOUNTER — Inpatient Hospital Stay: Payer: Medicare Other

## 2018-08-23 LAB — CBC
HEMATOCRIT: 31.8 % — AB (ref 39.0–52.0)
Hemoglobin: 8.9 g/dL — ABNORMAL LOW (ref 13.0–17.0)
MCH: 23.4 pg — AB (ref 26.0–34.0)
MCHC: 28 g/dL — AB (ref 30.0–36.0)
MCV: 83.7 fL (ref 80.0–100.0)
Platelets: 227 10*3/uL (ref 150–400)
RBC: 3.8 MIL/uL — ABNORMAL LOW (ref 4.22–5.81)
RDW: 15.9 % — AB (ref 11.5–15.5)
WBC: 11.5 10*3/uL — ABNORMAL HIGH (ref 4.0–10.5)
nRBC: 0 % (ref 0.0–0.2)

## 2018-08-23 LAB — BASIC METABOLIC PANEL
Anion gap: 5 (ref 5–15)
BUN: 30 mg/dL — AB (ref 8–23)
CHLORIDE: 109 mmol/L (ref 98–111)
CO2: 27 mmol/L (ref 22–32)
CREATININE: 1.67 mg/dL — AB (ref 0.61–1.24)
Calcium: 8.7 mg/dL — ABNORMAL LOW (ref 8.9–10.3)
GFR calc Af Amer: 42 mL/min — ABNORMAL LOW (ref >60)
GFR calc non Af Amer: 36 mL/min — ABNORMAL LOW (ref >60)
GLUCOSE: 146 mg/dL — AB (ref 70–99)
POTASSIUM: 5.2 mmol/L — AB (ref 3.5–5.1)
SODIUM: 141 mmol/L (ref 135–145)

## 2018-08-23 LAB — GLUCOSE, CAPILLARY
GLUCOSE-CAPILLARY: 118 mg/dL — AB (ref 70–99)
Glucose-Capillary: 147 mg/dL — ABNORMAL HIGH (ref 70–99)
Glucose-Capillary: 127 mg/dL — ABNORMAL HIGH (ref 70–99)
Glucose-Capillary: 133 mg/dL — ABNORMAL HIGH (ref 70–99)

## 2018-08-23 LAB — MRSA PCR SCREENING: MRSA by PCR: NEGATIVE

## 2018-08-23 MED ORDER — METOPROLOL SUCCINATE ER 50 MG PO TB24
100.0000 mg | ORAL_TABLET | Freq: Every day | ORAL | Status: DC
  Administered 2018-08-23 – 2018-08-25 (×4): 100 mg via ORAL
  Filled 2018-08-23 (×4): qty 2

## 2018-08-23 MED ORDER — ACETAMINOPHEN 650 MG RE SUPP: 650.0000 mg | Freq: Four times a day (QID) | RECTAL | Status: DC | PRN

## 2018-08-23 MED ORDER — PATIROMER SORBITEX CALCIUM 8.4 G PO PACK
8.4000 g | PACK | Freq: Once | ORAL | Status: AC
  Administered 2018-08-23: 8.4 g via ORAL
  Filled 2018-08-23: qty 1

## 2018-08-23 MED ORDER — OCUVITE-LUTEIN PO CAPS
1.0000 | ORAL_CAPSULE | Freq: Every day | ORAL | Status: DC
  Administered 2018-08-23 – 2018-08-25 (×4): 1 via ORAL
  Filled 2018-08-23 (×5): qty 1

## 2018-08-23 MED ORDER — ONDANSETRON HCL 4 MG/2ML IJ SOLN: 4.0000 mg | Freq: Four times a day (QID) | INTRAMUSCULAR | Status: DC | PRN

## 2018-08-23 MED ORDER — DICLOFENAC SODIUM 1 % TD GEL
1.0000 "application " | Freq: Four times a day (QID) | TRANSDERMAL | Status: DC | PRN
  Filled 2018-08-23: qty 100

## 2018-08-23 MED ORDER — SODIUM CHLORIDE 0.9 % IV SOLN
Freq: Once | INTRAVENOUS | Status: AC
  Administered 2018-08-23: 02:00:00 via INTRAVENOUS

## 2018-08-23 MED ORDER — HYDROMORPHONE HCL 1 MG/ML IJ SOLN
1.0000 mg | INTRAMUSCULAR | Status: DC | PRN
  Administered 2018-08-23 – 2018-08-24 (×6): 1 mg via INTRAVENOUS
  Filled 2018-08-23 (×6): qty 1

## 2018-08-23 MED ORDER — TRAZODONE HCL 50 MG PO TABS: 25.0000 mg | ORAL_TABLET | Freq: Every evening | ORAL | Status: DC | PRN

## 2018-08-23 MED ORDER — INSULIN ASPART PROT & ASPART (70-30 MIX) 100 UNIT/ML ~~LOC~~ SUSP
15.0000 [IU] | Freq: Two times a day (BID) | SUBCUTANEOUS | Status: DC
  Administered 2018-08-23 – 2018-08-26 (×3): 15 [IU] via SUBCUTANEOUS
  Filled 2018-08-23 (×3): qty 10

## 2018-08-23 MED ORDER — INSULIN ASPART PROT & ASPART (70-30 MIX) 100 UNIT/ML ~~LOC~~ SUSP
15.0000 [IU] | Freq: Two times a day (BID) | SUBCUTANEOUS | Status: DC
  Administered 2018-08-23: 15 [IU] via SUBCUTANEOUS
  Filled 2018-08-23: qty 10

## 2018-08-23 MED ORDER — SERTRALINE HCL 50 MG PO TABS
100.0000 mg | ORAL_TABLET | Freq: Every day | ORAL | Status: DC
  Administered 2018-08-23 – 2018-08-25 (×4): 100 mg via ORAL
  Filled 2018-08-23 (×4): qty 2

## 2018-08-23 MED ORDER — FUROSEMIDE 40 MG PO TABS
40.0000 mg | ORAL_TABLET | Freq: Every day | ORAL | Status: DC
  Administered 2018-08-23 – 2018-08-25 (×4): 40 mg via ORAL
  Filled 2018-08-23 (×4): qty 1

## 2018-08-23 MED ORDER — INSULIN ASPART 100 UNIT/ML ~~LOC~~ SOLN
0.0000 [IU] | Freq: Three times a day (TID) | SUBCUTANEOUS | Status: DC
  Administered 2018-08-23 – 2018-08-24 (×2): 1 [IU] via SUBCUTANEOUS
  Administered 2018-08-26: 2 [IU] via SUBCUTANEOUS
  Filled 2018-08-23 (×3): qty 1

## 2018-08-23 MED ORDER — DOCUSATE SODIUM 100 MG PO CAPS
100.0000 mg | ORAL_CAPSULE | Freq: Two times a day (BID) | ORAL | Status: DC
  Administered 2018-08-23 (×3): 100 mg via ORAL
  Filled 2018-08-23 (×3): qty 1

## 2018-08-23 MED ORDER — ACETAMINOPHEN 325 MG PO TABS
650.0000 mg | ORAL_TABLET | Freq: Four times a day (QID) | ORAL | Status: DC | PRN
  Filled 2018-08-23 (×2): qty 2

## 2018-08-23 MED ORDER — LOSARTAN POTASSIUM 25 MG PO TABS
25.0000 mg | ORAL_TABLET | Freq: Every day | ORAL | Status: DC
  Administered 2018-08-23: 25 mg via ORAL
  Filled 2018-08-23: qty 1

## 2018-08-23 MED ORDER — RIVAROXABAN 15 MG PO TABS
15.0000 mg | ORAL_TABLET | Freq: Every day | ORAL | Status: DC
  Administered 2018-08-23: 15 mg via ORAL
  Filled 2018-08-23 (×2): qty 1

## 2018-08-23 MED ORDER — PANTOPRAZOLE SODIUM 40 MG PO TBEC
40.0000 mg | DELAYED_RELEASE_TABLET | Freq: Every day | ORAL | Status: DC
  Administered 2018-08-23 – 2018-08-26 (×3): 40 mg via ORAL
  Filled 2018-08-23 (×3): qty 1

## 2018-08-23 MED ORDER — CEFAZOLIN SODIUM-DEXTROSE 2-4 GM/100ML-% IV SOLN
2.0000 g | INTRAVENOUS | Status: DC
  Filled 2018-08-23: qty 100

## 2018-08-23 MED ORDER — ONDANSETRON HCL 4 MG PO TABS: 4.0000 mg | ORAL_TABLET | Freq: Four times a day (QID) | ORAL | Status: DC | PRN

## 2018-08-23 MED ORDER — RIVAROXABAN 15 MG PO TABS
15.0000 mg | ORAL_TABLET | Freq: Every day | ORAL | Status: DC
  Filled 2018-08-23: qty 1

## 2018-08-23 MED ORDER — INSULIN ASPART 100 UNIT/ML ~~LOC~~ SOLN: 0.0000 [IU] | Freq: Every day | SUBCUTANEOUS | Status: DC

## 2018-08-23 MED ORDER — INSULIN ASPART PROT & ASPART (70-30 MIX) 100 UNIT/ML PEN: 15.0000 [IU] | PEN_INJECTOR | Freq: Two times a day (BID) | SUBCUTANEOUS | Status: DC

## 2018-08-23 MED ORDER — ATORVASTATIN CALCIUM 20 MG PO TABS
20.0000 mg | ORAL_TABLET | Freq: Every day | ORAL | Status: DC
  Administered 2018-08-23 – 2018-08-25 (×4): 20 mg via ORAL
  Filled 2018-08-23 (×4): qty 1

## 2018-08-23 MED ORDER — BISACODYL 5 MG PO TBEC: 5.0000 mg | DELAYED_RELEASE_TABLET | Freq: Every day | ORAL | Status: DC | PRN

## 2018-08-23 MED ORDER — FENTANYL CITRATE (PF) 100 MCG/2ML IJ SOLN: 100.0000 ug | INTRAMUSCULAR | Status: DC | PRN

## 2018-08-23 NOTE — ED Notes (Signed)
Pt is resting in bed. Family member at bedside.  

## 2018-08-23 NOTE — Clinical Social Work Note (Signed)
Clinical Social Work Assessment  Patient Details  Name: Gary Thomas MRN: [id number] Date of Birth: January 30, 1932  Date of referral:  08/23/18               Reason for consult:  Facility Placement                Permission sought to share information with:  Chartered certified accountant granted to share information::  Yes, Verbal Permission Granted  Name::      [place]::   [place]   Relationship::     Contact Information:     Housing/Transportation Living arrangements for the past 2 months:  [place] of Information:  Patient Patient Interpreter Needed:  None Criminal Activity/Legal Involvement Pertinent to Current Situation/Hospitalization:  No - Comment as needed Significant Relationships:  Spouse Lives with:  Spouse Do you feel safe going back to the place where you live?  Yes Need for family participation in patient care:  Yes (Comment)  Care giving concerns:  Patient lives in [place] with his wife Gary Thomas.    Social Worker assessment / plan:  Holiday representative (CSW) reviewed chart and noted that patient has a femur fracture. Ortho consult is pending. CSW met with patient alone at bedside today to discuss D/C plan. Patient was alert and oriented X4 and was laying in the bed. CSW introduced self and explained role of CSW department. Per patient he lives in [place] with his wife. CSW explained that PT will evaluate him and make a recommendation of home health or SNF. Patient prefers to go to SNF and requested [facility]. Patient is okay with a semi-private room. CSW explained that medicare requires a 3 night qualifying inpatient stay in the hospital in order to pay for SNF. Patient was admitted to inpatient 08/22/18. Patient verbalized his understanding. FL2 complete and faxed out. CSW will continue to follow and assist as needed.   Employment status:  Retired Forensic scientist:  Medicare PT  Recommendations:  Not assessed at this time [place] / Referral to community resources:  [place]  Patient/Family's Response to care:  Patient is agreeable to [facility] in [place].   Patient/Family's Understanding of and Emotional Response to Diagnosis, Current Treatment, and Prognosis:  Patient was very pleasant and thanked CSW for assistance.   Emotional Assessment Appearance:  Appears stated age Attitude/Demeanor/Rapport:    Affect (typically observed):  Accepting, Adaptable, Pleasant Orientation:  Oriented to Self, Oriented to Place, Oriented to  Time, Oriented to Situation Alcohol / Substance use:  Not Applicable Psych involvement (Current and /or in the community):  No (Comment)  Discharge Needs  Concerns to be addressed:  Discharge Planning Concerns Readmission within the last 30 days:  No Current discharge risk:  Dependent with Mobility Barriers to Discharge:  Continued Medical Work up   [employer], Veronia Beets, LCSW 08/23/2018, 2:23 PM

## 2018-08-23 NOTE — H&P (Signed)
[facility]   PATIENT NAME: Gary Thomas    MR#:  [id number]  DATE OF BIRTH:  [birth year]  DATE OF ADMISSION:  08/22/2018  PRIMARY CARE PHYSICIAN: Marguarite Arbour, MD   REQUESTING/REFERRING PHYSICIAN:   CHIEF COMPLAINT:   Chief Complaint  Patient presents with  . Hip Pain  . Knee Pain    HISTORY OF PRESENT ILLNESS: Gary Thomas  is a 84 y.o. male with a known history of diabetes type 2, kidney disease, hypertension, right-sided weakness status post remote stroke. Patient was brought to emergency room status post mechanical fall at home.  Patient reports that after dinner, he attempted to stand up and return to his bedroom when his right leg gave out from underneath him.  He fell on his right side.  He did not hit his head or lose consciousness either fall.  Patient is complaining of bilateral knee, bilateral hip pain.  He has not been able to bear weight on the right lower extremity since the fall. Blood test done emergency room are notable for WBC at 12.9, hemoglobin 4.06, creatinine 1.59, BUN 30, glucose 163. Right knee x-ray shows comminuted distal femoral fracture extending into the femoral component of the knee replacement.  No acute abnormalities per chest x-ray. Patient is admitted for further evaluation and treatment.   PAST MEDICAL HISTORY:   Past Medical History:  Diagnosis Date  . Depression   . Diabetes mellitus without complication (HCC)   . Dyspnea   . Elevated lipids   . GERD (gastroesophageal reflux disease)   . Hypertension   . Pulmonary emboli (HCC)   . Renal insufficiency    only has 1 kidney  . Sleep apnea   . Stroke (HCC)    Rt side weakness    PAST SURGICAL HISTORY:  Past Surgical History:  Procedure Laterality Date  . 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 AORTA SURGERY    . CAROTID ARTERY - SUBCLAVIAN ARTERY BYPASS GRAFT Bilateral   . CATARACT  EXTRACTION W/ INTRAOCULAR LENS  IMPLANT, BILATERAL    . CHOLECYSTECTOMY    . EYE SURGERY    . FEMUR FRACTURE SURGERY Right   . HIP FRACTURE SURGERY Bilateral   . JOINT REPLACEMENT    . PROSTATE SURGERY    . REPLACEMENT TOTAL KNEE Right   . TOTAL HIP ARTHROPLASTY Right 03/02/2017   Procedure: TOTAL HIP ARTHROPLASTY ANTERIOR APPROACH;  Surgeon: Kennedy Bucker, MD;  Location: [facility] ORS;  Service: Orthopedics;  Laterality: Right;  Marland Kitchen VASCULAR SURGERY      SOCIAL HISTORY:  Social History   Tobacco Use  . Smoking status: Former Smoker    Last attempt to quit: 02/18/1952    Years since quitting: 66.5  . Smokeless tobacco: Former Engineer, water Use Topics  . Alcohol use: No    FAMILY HISTORY:  Family History  Problem Relation Age of Onset  . Heart attack Father   . Stroke Father   . Heart disease Father     DRUG ALLERGIES:  Allergies  Allergen Reactions  . Codeine Anaphylaxis  . Morphine Anaphylaxis  . Gabapentin Other (See Comments)    Unsure of what the reaction was   . Stadol [Butorphanol]     Unknown  . Lyrica [Pregabalin] Rash  . Tapentadol Nausea Only    ONSET 01/04/2013    REVIEW OF SYSTEMS:   CONSTITUTIONAL: No fever, fatigue or weakness.  EYES: No  changes in vision.  EARS, NOSE, AND THROAT: No tinnitus or ear pain.  RESPIRATORY: No cough, shortness of breath, wheezing or hemoptysis.  CARDIOVASCULAR: No chest pain, orthopnea, edema.  GASTROINTESTINAL: No nausea, vomiting, diarrhea or abdominal pain.  GENITOURINARY: No dysuria, hematuria.  ENDOCRINE: No polyuria, nocturia. HEMATOLOGY: No bleeding. SKIN: No rash or lesion. MUSCULOSKELETAL: Positive for severe pain at the right knee area.  Unable to ambulate, status post fall, status post right femur fracture. NEUROLOGIC: No focal weakness.  PSYCHIATRY: No anxiety or depression.   MEDICATIONS AT HOME:  Prior to Admission medications   Medication Sig Start Date End Date Taking? Authorizing Provider   atorvastatin (LIPITOR) 20 MG tablet Take 20 mg by mouth at bedtime.  02/02/17  Yes [provider]  beta carotene w/minerals (OCUVITE) tablet Take 1 tablet by mouth at bedtime.    Yes [provider]  furosemide (LASIX) 40 MG tablet Take 40 mg by mouth at bedtime.  02/09/17  Yes [provider]  losartan (COZAAR) 25 MG tablet Take 25 mg by mouth at bedtime.  01/14/17  Yes [provider]  metoprolol succinate (TOPROL-XL) 100 MG 24 hr tablet Take 100 mg by mouth at bedtime.  02/12/16  Yes [provider]  NOVOLOG MIX 70/30 FLEXPEN (70-30) 100 UNIT/ML FlexPen Inject 25 Units into the skin 2 (two) times daily. 11/20/16  Yes [provider]  omeprazole (PRILOSEC) 40 MG capsule Take by mouth daily. 08/04/18  Yes [provider]  sertraline (ZOLOFT) 100 MG tablet Take 100 mg by mouth at bedtime.  01/23/17  Yes [provider]  XARELTO 15 MG TABS tablet Take 15 mg by mouth at bedtime.  07/18/18  Yes [provider]  diclofenac sodium (VOLTAREN) 1 % GEL Apply 1 application topically 4 (four) times daily as needed (for pain.).  03/24/17   [provider]  HYDROcodone-acetaminophen (NORCO/VICODIN) 5-325 MG tablet Take 2 tablets by mouth every 4 (four) hours as needed for severe pain ((score 7 to 10)). Patient not taking: Reported on 08/22/2018 02/21/18   Maeola Harman, MD  methocarbamol (ROBAXIN) 500 MG tablet Take 1 tablet (500 mg total) by mouth every 6 (six) hours as needed for muscle spasms. Patient not taking: Reported on 08/22/2018 02/21/18   Maeola Harman, MD  tiotropium (SPIRIVA HANDIHALER) 18 MCG inhalation capsule Place 1 capsule (18 mcg total) into inhaler and inhale daily. Patient not taking: Reported on 02/02/2018 04/07/17 04/07/18  Shane Crutch, MD      PHYSICAL EXAMINATION:   VITAL SIGNS: Blood pressure 123/78, pulse 93, temperature 98.2 F (36.8 C), temperature source Oral, resp. rate 18, height 5\' 9"   (1.753 m), weight 96.2 kg, SpO2 97 %.  GENERAL:  84 y.o.-year-old patient lying in the bed with severe distress, secondary to right knee pain.  EYES: Pupils equal, round, reactive to light and accommodation. No scleral icterus. Extraocular muscles intact.  HEENT: Head atraumatic, normocephalic. Oropharynx and nasopharynx clear.  NECK:  Supple, no jugular venous distention. No thyroid enlargement, no tenderness.  LUNGS: Normal breath sounds bilaterally, no wheezing, rales,rhonchi or crepitation. No use of accessory muscles of respiration.  CARDIOVASCULAR: S1, S2 normal. No S3/S4.  ABDOMEN: Soft, nontender, nondistended. Bowel sounds present. No organomegaly or mass.  EXTREMITIES: There is limited range of motion to bilateral lower extremities due to pain.  No gross deformities appreciated.  No gross shortening or rotation of either lower extremity.  Patient is very tender to palpation at bilateral hips and knee areas NEUROLOGIC EXAM:  Is limited due to severe tenderness in bilateral lower extremities.  However, no gross focal neurologic deficits are appreciated. PSYCHIATRIC: The patient is alert and oriented x 3.  SKIN: No obvious rash, lesion, or ulcer.   LABORATORY PANEL:   CBC Recent Labs  Lab 08/22/18 2123  WBC 12.9*  HGB 9.8*  HCT 33.3*  PLT 238  MCV 82.0  MCH 24.1*  MCHC 29.4*  RDW 15.8*  LYMPHSABS 1.6  MONOABS 1.0  EOSABS 0.2  BASOSABS 0.1   ------------------------------------------------------------------------------------------------------------------  Chemistries  Recent Labs  Lab 08/22/18 2123  NA 137  K 5.0  CL 105  CO2 26  GLUCOSE 163*  BUN 30*  CREATININE 1.59*  CALCIUM 8.9  AST 26  ALT 15  ALKPHOS 81  BILITOT 0.6   ------------------------------------------------------------------------------------------------------------------ estimated creatinine clearance is 39.6 mL/min (A) (by C-G formula based on SCr of 1.59 mg/dL  (H)). ------------------------------------------------------------------------------------------------------------------ No results for input(s): TSH, T4TOTAL, T3FREE, THYROIDAB in the last 72 hours.  Invalid input(s): FREET3   Coagulation profile No results for input(s): INR, PROTIME in the last 168 hours. ------------------------------------------------------------------------------------------------------------------- No results for input(s): DDIMER in the last 72 hours. -------------------------------------------------------------------------------------------------------------------  Cardiac Enzymes No results for input(s): CKMB, TROPONINI, MYOGLOBIN in the last 168 hours.  Invalid input(s): CK ------------------------------------------------------------------------------------------------------------------ Invalid input(s): POCBNP  ---------------------------------------------------------------------------------------------------------------  Urinalysis    Component Value Date/Time   COLORURINE YELLOW (A) 02/17/2017 1400   APPEARANCEUR CLOUDY (A) 02/17/2017 1400   LABSPEC 1.015 02/17/2017 1400   PHURINE 8.0 02/17/2017 1400   GLUCOSEU NEGATIVE 02/17/2017 1400   HGBUR NEGATIVE 02/17/2017 1400   BILIRUBINUR NEGATIVE 02/17/2017 1400   KETONESUR NEGATIVE 02/17/2017 1400   PROTEINUR 100 (A) 02/17/2017 1400   NITRITE NEGATIVE 02/17/2017 1400   LEUKOCYTESUR SMALL (A) 02/17/2017 1400     RADIOLOGY: Dg Chest 1 View  Result Date: 08/22/2018 CLINICAL DATA:  Preoperative radiograph EXAM: CHEST  1 VIEW COMPARISON:  None. FINDINGS: The heart size and mediastinal contours are within normal limits. Both lungs are clear. The visualized skeletal structures are unremarkable. IMPRESSION: No active disease. Electronically Signed   By: Deatra Robinson M.D.   On: 08/22/2018 21:27   Dg Lumbar Spine Complete  Result Date: 08/22/2018 CLINICAL DATA:  Pain following fall EXAM: LUMBAR SPINE -  COMPLETE 4+ VIEW COMPARISON:  March 21, 2018 FINDINGS: Frontal, lateral, spot lumbosacral lateral, and bilateral oblique views were obtained. There are 5 non-rib-bearing lumbar type vertebral bodies. There are multiple postoperative laminectomy defects throughout the lumbar region. The patient is status post posterior screw and plate fixation at L3, L4, L5, and S1 bilaterally. Screw tips are in respective vertebral bodies. There is no acute fracture or spondylolisthesis. The disc spaces appear normal. There is no appreciable facet arthropathy. There is aortic atherosclerosis. There are surgical clips in the left abdomen posteriorly. IMPRESSION: Postoperative change at multiple levels. No fracture or spondylolisthesis. No appreciable disc space narrowing. There is aortic atherosclerosis. Aortic Atherosclerosis (ICD10-I70.0). Electronically Signed   By: Bretta Bang III M.D.   On: 08/22/2018 20:19   Dg Ankle Complete Left  Result Date: 08/22/2018 CLINICAL DATA:  Left ankle pain after fall EXAM: LEFT ANKLE COMPLETE - 3+ VIEW COMPARISON:  None. FINDINGS: Vascular calcifications.  No bony abnormalities noted. IMPRESSION: No acute abnormalities. Electronically Signed   By: Gerome Sam III M.D   On: 08/22/2018 23:20   Ct Hip Left Wo Contrast  Result Date: 08/22/2018 CLINICAL DATA:  Rule out left hip fracture based on x-ray. EXAM: CT OF THE LEFT  HIP WITHOUT CONTRAST TECHNIQUE: Multidetector CT imaging of the left hip was performed according to the standard protocol. Multiplanar CT image reconstructions were also generated. COMPARISON:  None. FINDINGS: No fracture or dislocation. Linear sclerosis through the femoral neck is consistent with previous instrumentation. IMPRESSION: No fracture or dislocation. Electronically Signed   By: Gerome Sam III M.D   On: 08/22/2018 22:12   Dg Knee Complete 4 Views Left  Result Date: 08/22/2018 CLINICAL DATA:  Pain after fall EXAM: LEFT KNEE - COMPLETE 4+ VIEW  COMPARISON:  None. FINDINGS: Vascular calcifications. Mild patellofemoral compartment degenerative changes. No acute fractures identified. No joint effusion. IMPRESSION: Negative. Electronically Signed   By: Gerome Sam III M.D   On: 08/22/2018 20:23   Dg Knee Complete 4 Views Right  Result Date: 08/22/2018 CLINICAL DATA:  Pain after fall EXAM: RIGHT KNEE - COMPLETE 4+ VIEW COMPARISON:  None. FINDINGS: The patient is status post knee replacement. There is a fracture through the distal femur extending to the level of the femoral component. The fracture is comminuted as seen on the third image. Apparent callus formation in the mid femur may be from previous fracture. No other acute abnormalities identified. IMPRESSION: Comminuted distal femoral fracture extending into the femoral component of the knee replacement. Electronically Signed   By: Gerome Sam III M.D   On: 08/22/2018 20:29   Dg Hip Unilat W Or Wo Pelvis 2-3 Views Left  Result Date: 08/22/2018 CLINICAL DATA:  Pain after fall EXAM: DG HIP (WITH OR WITHOUT PELVIS) 2-3V LEFT COMPARISON:  None. FINDINGS: Linear high attenuation in the femoral neck is likely from prior instrumentation. Lucency through the greater trochanter on the third image is noted. No other acute abnormalities. Previous right hip replacement. IMPRESSION: 1. Subtle linear lucency through the greater trochanter on only one view. A subtle nondisplaced fracture is not excluded. CT imaging could better evaluate. No other abnormalities. Electronically Signed   By: Gerome Sam III M.D   On: 08/22/2018 20:26   Dg Hip Unilat W Or Wo Pelvis 2-3 Views Right  Result Date: 08/22/2018 CLINICAL DATA:  PAIN FOLLOWING FALL EXAM: DG HIP (WITH OR WITHOUT PELVIS) BILATERALLY: 4+ VIEWS COMPARISON:  PELVIS AND RIGHT HIP May 29, 2017 FINDINGS: Frontal pelvis as well as frontal and lateral views of each hip joint obtained. There is no appreciable fracture or dislocation. There is  evidence of prior postoperative change on the left, stable. There is a total hip replacement on the right with prosthetic components well-seated. There is mild narrowing of the left hip joint. No erosive changes. There are surgical clips in the pelvis. There is postoperative change in the lower lumbar spine and upper sacrum. There are foci of arterial vascular calcification. IMPRESSION: No acute fracture or dislocation. Postoperative change on the left with stable appearance. Total hip replacement on the right with prosthetic components well-seated. Mild narrowing left hip joint. Postoperative change lower lumbar spine and upper sacrum. Foci of arterial vascular calcification noted. Electronically Signed   By: Bretta Bang III M.D.   On: 08/22/2018 20:23    EKG: Orders placed or performed during the hospital encounter of 02/07/18  . EKG 12-Lead  . EKG 12-Lead    IMPRESSION AND PLAN:  1.  Acute right distal femur fracture, that is post mechanical fall.  Patient is unable to ambulate.  We will have orthopedics and PT/OT evaluate the patient for further management.  Continue pain control. 2.  CKD 3.  Creatinine is at baseline.  Continue to monitor kidney function closely and avoid nephrotoxic medications. 3.  Hypertension, stable, resume home medications. 4.  Diabetes type 2.  Will start low-carb diet and monitor blood sugars before meals and at bedtime.  Will use insulin treatment during the hospital stay. 5.  History of PE, on chronic anticoagulation with Xarelto.  All the records are reviewed and case discussed with ED provider. Management plans discussed with the patient, family and they are in agreement.  CODE STATUS: Full Code Status History    Date Active Date Inactive Code Status Order ID Comments User Context   02/15/2018 2012 02/21/2018 1700 Full Code [id number]  Maeola HarmanStern, Joseph, MD Inpatient   03/02/2017 1938 03/05/2017 1439 Full Code [id number]  Kennedy BuckerMenz, Michael, MD Inpatient        TOTAL TIME TAKING CARE OF THIS PATIENT: 50 minutes.    Cammy CopaAngela Josha Weekley M.D on 08/23/2018 at 12:44 AM  Between 7am to 6pm - Pager - [phone]  After 6pm go to [email or web address] - password [email or web address] [email or web address]  [facility] [place] [facility]  [phone]  CC: Primary care physician; Marguarite ArbourSparks, [name], MD

## 2018-08-23 NOTE — Progress Notes (Signed)
Pt has become increasingly confused AOx2-3  and has taken out his IV.

## 2018-08-23 NOTE — Consult Note (Signed)
ORTHOPAEDIC CONSULTATION  REQUESTING PHYSICIAN: Mayo, Allyn Kenner, MD  Chief Complaint:   Right knee pain.  History of Present Illness: Gary Thomas is a 84 y.o. male with multiple medical problems including hypertension, renal insufficiency, sleep apnea, hyperlipidemia, diabetes, and depression who lives independently with his wife in their own home.  He apparently has some significant degenerative disc disease of his lower back which causes him to fall fairly frequently despite a major back procedure in the recent past.  The patient also is status post a right total knee arthroplasty and a right total hip arthroplasty in the not too distant past.  The patient was in his usual state of health when he apparently fell last night while getting up from the dinner table and landed on his right side.  He presented to the emergency room complaining of right knee more so than left hip pain.  X-rays suggested a nondisplaced supracondylar fracture of his right femur just proximal to the femoral component of his total knee arthroplasty.  He also complained of left hip pain, but x-rays of his left hip were unremarkable as was a CT scan.  The patient denies striking his head when he fell.  He did not lose consciousness.  He also denies any lightheadedness, dizziness, chest pain, shortness breath, or other symptoms which may have precipitated his fall.  Past Medical History:  Diagnosis Date  . Depression   . Diabetes mellitus without complication (HCC)   . Dyspnea   . Elevated lipids   . GERD (gastroesophageal reflux disease)   . Hypertension   . Pulmonary emboli (HCC)   . Renal insufficiency    only has 1 kidney  . Sleep apnea   . Stroke (HCC)    Rt side weakness   Past Surgical History:  Procedure Laterality Date  . 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 AORTA SURGERY    . CAROTID ARTERY -  SUBCLAVIAN ARTERY BYPASS GRAFT Bilateral   . CATARACT EXTRACTION W/ INTRAOCULAR LENS  IMPLANT, BILATERAL    . CHOLECYSTECTOMY    . EYE SURGERY    . FEMUR FRACTURE SURGERY Right   . HIP FRACTURE SURGERY Bilateral   . JOINT REPLACEMENT    . PROSTATE SURGERY    . REPLACEMENT TOTAL KNEE Right   . TOTAL HIP ARTHROPLASTY Right 03/02/2017   Procedure: TOTAL HIP ARTHROPLASTY ANTERIOR APPROACH;  Surgeon: Kennedy Bucker, MD;  Location: [facility] ORS;  Service: Orthopedics;  Laterality: Right;  Marland Kitchen VASCULAR SURGERY     Social History   Socioeconomic History  . Marital status: Married    Spouse name: Not on file  . Number of children: Not on file  . Years of education: Not on file  . Highest education level: Not on file  Occupational History  . Not on file  Social Needs  . Financial resource strain: Not on file  . Food insecurity:    Worry: Not on file    Inability: Not on file  . Transportation needs:    Medical: Not on file    Non-medical: Not on file  Tobacco Use  . Smoking status: Former Smoker    Last attempt to quit: 02/18/1952    Years since quitting: 66.5  . Smokeless tobacco: Former Engineer, water and Sexual Activity  . Alcohol use: No  . Drug use: No  . Sexual activity: Not on file  Lifestyle  . Physical activity:    Days per week:  Not on file    Minutes per session: Not on file  . Stress: Not on file  Relationships  . Social connections:    Talks on phone: Not on file    Gets together: Not on file    Attends religious service: Not on file    Active member of club or organization: Not on file    Attends meetings of clubs or organizations: Not on file    Relationship status: Not on file  Other Topics Concern  . Not on file  Social History Narrative  . Not on file   Family History  Problem Relation Age of Onset  . Heart attack Father   . Stroke Father   . Heart disease Father    Allergies  Allergen Reactions  . Codeine Anaphylaxis  . Morphine Anaphylaxis  .  Gabapentin Other (See Comments)    Unsure of what the reaction was   . Stadol [Butorphanol]     Unknown  . Lyrica [Pregabalin] Rash  . Tapentadol Nausea Only    ONSET 01/04/2013   Prior to Admission medications   Medication Sig Start Date End Date Taking? Authorizing Provider  atorvastatin (LIPITOR) 20 MG tablet Take 20 mg by mouth at bedtime.  02/02/17  Yes [provider]  beta carotene w/minerals (OCUVITE) tablet Take 1 tablet by mouth at bedtime.    Yes [provider]  furosemide (LASIX) 40 MG tablet Take 40 mg by mouth at bedtime.  02/09/17  Yes [provider]  losartan (COZAAR) 25 MG tablet Take 25 mg by mouth at bedtime.  01/14/17  Yes [provider]  metoprolol succinate (TOPROL-XL) 100 MG 24 hr tablet Take 100 mg by mouth at bedtime.  02/12/16  Yes [provider]  NOVOLOG MIX 70/30 FLEXPEN (70-30) 100 UNIT/ML FlexPen Inject 25 Units into the skin 2 (two) times daily. 11/20/16  Yes [provider]  omeprazole (PRILOSEC) 40 MG capsule Take by mouth daily. 08/04/18  Yes [provider]  sertraline (ZOLOFT) 100 MG tablet Take 100 mg by mouth at bedtime.  01/23/17  Yes [provider]  XARELTO 15 MG TABS tablet Take 15 mg by mouth at bedtime.  07/18/18  Yes [provider]  diclofenac sodium (VOLTAREN) 1 % GEL Apply 1 application topically 4 (four) times daily as needed (for pain.).  03/24/17   [provider]  HYDROcodone-acetaminophen (NORCO/VICODIN) 5-325 MG tablet Take 2 tablets by mouth every 4 (four) hours as needed for severe pain ((score 7 to 10)). Patient not taking: Reported on 08/22/2018 02/21/18   Maeola Harman, MD  methocarbamol (ROBAXIN) 500 MG tablet Take 1 tablet (500 mg total) by mouth every 6 (six) hours as needed for muscle spasms. Patient not taking: Reported on 08/22/2018 02/21/18   Maeola Harman, MD  tiotropium (SPIRIVA HANDIHALER) 18 MCG inhalation capsule Place 1 capsule (18 mcg total)  into inhaler and inhale daily. Patient not taking: Reported on 02/02/2018 04/07/17 04/07/18  Shane Crutch, MD   Dg Chest 1 View  Result Date: 08/22/2018 CLINICAL DATA:  Preoperative radiograph EXAM: CHEST  1 VIEW COMPARISON:  None. FINDINGS: The heart size and mediastinal contours are within normal limits. Both lungs are clear. The visualized skeletal structures are unremarkable. IMPRESSION: No active disease. Electronically Signed   By: Deatra Robinson M.D.   On: 08/22/2018 21:27   Dg Lumbar Spine Complete  Result Date: 08/22/2018 CLINICAL DATA:  Pain following fall EXAM: LUMBAR SPINE - COMPLETE 4+ VIEW COMPARISON:  March 21, 2018 FINDINGS: Frontal, lateral, spot lumbosacral lateral, and bilateral oblique views were obtained. There are 5 non-rib-bearing lumbar type vertebral bodies. There are multiple postoperative laminectomy defects throughout the lumbar region. The patient is status post posterior screw and plate fixation at L3, L4, L5, and S1 bilaterally. Screw tips are in respective vertebral bodies. There is no acute fracture or spondylolisthesis. The disc spaces appear normal. There is no appreciable facet arthropathy. There is aortic atherosclerosis. There are surgical clips in the left abdomen posteriorly. IMPRESSION: Postoperative change at multiple levels. No fracture or spondylolisthesis. No appreciable disc space narrowing. There is aortic atherosclerosis. Aortic Atherosclerosis (ICD10-I70.0). Electronically Signed   By: Bretta Bang III M.D.   On: 08/22/2018 20:19   Dg Ankle Complete Left  Result Date: 08/22/2018 CLINICAL DATA:  Left ankle pain after fall EXAM: LEFT ANKLE COMPLETE - 3+ VIEW COMPARISON:  None. FINDINGS: Vascular calcifications.  No bony abnormalities noted. IMPRESSION: No acute abnormalities. Electronically Signed   By: Gerome Sam III M.D   On: 08/22/2018 23:20   Ct Hip Left Wo Contrast  Result Date: 08/22/2018 CLINICAL DATA:  Rule out left hip  fracture based on x-ray. EXAM: CT OF THE LEFT HIP WITHOUT CONTRAST TECHNIQUE: Multidetector CT imaging of the left hip was performed according to the standard protocol. Multiplanar CT image reconstructions were also generated. COMPARISON:  None. FINDINGS: No fracture or dislocation. Linear sclerosis through the femoral neck is consistent with previous instrumentation. IMPRESSION: No fracture or dislocation. Electronically Signed   By: Gerome Sam III M.D   On: 08/22/2018 22:12   Dg Knee Complete 4 Views Left  Result Date: 08/22/2018 CLINICAL DATA:  Pain after fall EXAM: LEFT KNEE - COMPLETE 4+ VIEW COMPARISON:  None. FINDINGS: Vascular calcifications. Mild patellofemoral compartment degenerative changes. No acute fractures identified. No joint effusion. IMPRESSION: Negative. Electronically Signed   By: Gerome Sam III M.D   On: 08/22/2018 20:23   Dg Knee Complete 4 Views Right  Result Date: 08/22/2018 CLINICAL DATA:  Pain after fall EXAM: RIGHT KNEE - COMPLETE 4+ VIEW COMPARISON:  None. FINDINGS: The patient is status post knee replacement. There is a fracture through the distal femur extending to the level of the femoral component. The fracture is comminuted as seen on the third image. Apparent callus formation in the mid femur may be from previous fracture. No other acute abnormalities identified. IMPRESSION: Comminuted distal femoral fracture extending into the femoral component of the knee replacement. Electronically Signed   By: Gerome Sam III M.D   On: 08/22/2018 20:29   Dg Hip Unilat W Or Wo Pelvis 2-3 Views Left  Result Date: 08/22/2018 CLINICAL DATA:  Pain after fall EXAM: DG HIP (WITH OR WITHOUT PELVIS) 2-3V LEFT COMPARISON:  None. FINDINGS: Linear high attenuation in the femoral neck is likely from prior instrumentation. Lucency through the greater trochanter on the third image is noted. No other acute abnormalities. Previous right hip replacement. IMPRESSION: 1. Subtle linear  lucency through the greater trochanter on only one view. A subtle nondisplaced fracture is not excluded. CT imaging could better evaluate. No other abnormalities. Electronically Signed   By: Gerome Sam III M.D   On: 08/22/2018 20:26   Dg Hip Unilat W Or Wo Pelvis 2-3 Views Right  Result Date: 08/22/2018 CLINICAL DATA:  PAIN FOLLOWING FALL EXAM: DG HIP (WITH OR WITHOUT PELVIS) BILATERALLY: 4+ VIEWS COMPARISON:  PELVIS AND RIGHT HIP May 29, 2017 FINDINGS: Frontal pelvis as well as frontal and lateral views  of each hip joint obtained. There is no appreciable fracture or dislocation. There is evidence of prior postoperative change on the left, stable. There is a total hip replacement on the right with prosthetic components well-seated. There is mild narrowing of the left hip joint. No erosive changes. There are surgical clips in the pelvis. There is postoperative change in the lower lumbar spine and upper sacrum. There are foci of arterial vascular calcification. IMPRESSION: No acute fracture or dislocation. Postoperative change on the left with stable appearance. Total hip replacement on the right with prosthetic components well-seated. Mild narrowing left hip joint. Postoperative change lower lumbar spine and upper sacrum. Foci of arterial vascular calcification noted. Electronically Signed   By: Bretta BangWilliam  Woodruff III M.D.   On: 08/22/2018 20:23   Dg Femur, Min 2 Views Right  Result Date: 08/23/2018 CLINICAL DATA:  Known distal right femoral fracture EXAM: RIGHT FEMUR 2 VIEWS COMPARISON:  05/29/2017 FINDINGS: Right hip replacement is again identified. No dislocation is noted. Prior healed fracture in the midshaft of the right femur is noted. The distal femoral fracture is again identified extending to the level of prosthesis similar to that seen on the prior knee x-ray. A fat fluid level is noted in the suprapatellar bursa consistent with the fracture. IMPRESSION: Distal femoral fracture similar to  that seen on prior knee exam. A fat fluid level is noted within the right knee joint. Electronically Signed   By: Alcide CleverMark  Lukens M.D.   On: 08/23/2018 13:25    Positive ROS: All other systems have been reviewed and were otherwise negative with the exception of those mentioned in the HPI and as above.  Physical Exam: General:  Alert, no acute distress Psychiatric:  Patient is competent for consent with normal mood and affect   Cardiovascular:  No pedal edema Respiratory:  No wheezing, non-labored breathing GI:  Abdomen is soft and non-tender Skin:  No lesions in the area of chief complaint Neurologic:  Sensation intact distally Lymphatic:  No axillary or cervical lymphadenopathy  Orthopedic Exam:  Orthopedic examination is limited to the right lower extremity.  Skin inspection around the right knee is notable for moderate swelling.  There is a well-healed anterior midline surgical incision, but no other skin violations are identified.  The patient had tenderness to palpation over the distal aspect of his thigh, and has more significant pain in this area with any attempted active or passive motion of the knee.  He is able to actively dorsiflex and plantarflex his toes and ankle.  Sensation is intact light touch to all distributions.  He has good capillary refill to his right foot.  X-rays:  AP, lateral, and oblique views of the right knee are available for review and have been reviewed by myself.  These films demonstrate evidence of an essentially nondisplaced supracondylar fracture extending to the femoral prosthesis.  The patient is status post a right total knee arthroplasty.  However, the prosthesis appears to be intact and shows no evidence for loosening.  No lytic lesions are identified.  Assessment: Nondisplaced periprosthetic supracondylar right femur fracture.  Plan: The treatment options have been discussed with the patient and his wife, who is at the bedside, including both surgical  and nonsurgical choices.  The patient would like to proceed with surgical intervention to include an open reduction and internal fixation of the right supracondylar femur fracture.  This procedure has been discussed in detail, as have the potential risks (including bleeding, infection, nerve and/or blood vessel injury,  persistent or recurrent pain, stiffness of the knee, malunion or nonunion, need for further surgery, blood clots, strokes, heart attacks and arrhythmias, etc.) and benefits.  The patient states his understanding and wishes to proceed.  A formal written consent will be obtained by the nursing staff.  Thank you for asking me to participate in the care of this most pleasant yet unfortunate man.  I will be happy to follow him with you.   Maryagnes Amos, MD  Beeper #:  [phone]  08/23/2018 5:36 PM

## 2018-08-23 NOTE — Progress Notes (Signed)
[employer] [facility] [place] [facility]   PATIENT NAME: Gary Thomas    MR#:  [id number]  DATE OF BIRTH:  15-Apr-1932  SUBJECTIVE:   Still having a lot of pain this morning.  The pain meds are helping.  Per family, having intermittent confusion.  REVIEW OF SYSTEMS:  Review of Systems  Constitutional: Negative for chills and fever.  HENT: Negative for congestion and sore throat.   Eyes: Negative for blurred vision and double vision.  Respiratory: Negative for cough and shortness of breath.   Cardiovascular: Negative for chest pain, palpitations and leg swelling.  Gastrointestinal: Negative for abdominal pain, nausea and vomiting.  Genitourinary: Negative for dysuria and frequency.  Musculoskeletal: Positive for falls and joint pain.  Neurological: Negative for dizziness and headaches.  Psychiatric/Behavioral: Negative for depression. The patient is not nervous/anxious.     DRUG ALLERGIES:   Allergies  Allergen Reactions  . Codeine Anaphylaxis  . Morphine Anaphylaxis  . Gabapentin Other (See Comments)    Unsure of what the reaction was   . Stadol [Butorphanol]     Unknown  . Lyrica [Pregabalin] Rash  . Tapentadol Nausea Only    ONSET 01/04/2013   VITALS:  Blood pressure (!) 144/68, pulse 86, temperature 98 F (36.7 C), temperature source Oral, resp. rate 20, height 5\' 9"  (1.753 m), weight 96.2 kg, SpO2 93 %. PHYSICAL EXAMINATION:  Physical Exam  GENERAL:  84 y.o.-year-old patient lying in the bed with severe distress, secondary to right knee pain.  EYES: Pupils equal, round, reactive to light and accommodation. No scleral icterus. Extraocular muscles intact.  HEENT: Head atraumatic, normocephalic. Oropharynx and nasopharynx clear.  NECK:  Supple, no jugular venous distention. No thyroid enlargement, no tenderness.  LUNGS: Normal breath sounds bilaterally, no wheezing, rales,rhonchi or crepitation. No use of accessory muscles of respiration.    CARDIOVASCULAR: S1, S2 normal. No S3/S4.  ABDOMEN: Soft, nontender, nondistended. Bowel sounds present. No organomegaly or mass.  EXTREMITIES: +right knee immobilizer in place, no LE edema or cyanosis NEUROLOGIC EXAM: No focal deficits, sensation intact PSYCHIATRIC: The patient is alert and oriented x 2 (to person and place).  SKIN: No obvious rash, lesion, or ulcer.  LABORATORY PANEL:  Male CBC Recent Labs  Lab 08/23/18 0500  WBC 11.5*  HGB 8.9*  HCT 31.8*  PLT 227   ------------------------------------------------------------------------------------------------------------------ Chemistries  Recent Labs  Lab 08/22/18 2123 08/23/18 0500  NA 137 141  K 5.0 5.2*  CL 105 109  CO2 26 27  GLUCOSE 163* 146*  BUN 30* 30*  CREATININE 1.59* 1.67*  CALCIUM 8.9 8.7*  AST 26  --   ALT 15  --   ALKPHOS 81  --   BILITOT 0.6  --    RADIOLOGY:  Dg Chest 1 View  Result Date: 08/22/2018 CLINICAL DATA:  Preoperative radiograph EXAM: CHEST  1 VIEW COMPARISON:  None. FINDINGS: The heart size and mediastinal contours are within normal limits. Both lungs are clear. The visualized skeletal structures are unremarkable. IMPRESSION: No active disease. Electronically Signed   By: Deatra Robinson M.D.   On: 08/22/2018 21:27   Dg Lumbar Spine Complete  Result Date: 08/22/2018 CLINICAL DATA:  Pain following fall EXAM: LUMBAR SPINE - COMPLETE 4+ VIEW COMPARISON:  March 21, 2018 FINDINGS: Frontal, lateral, spot lumbosacral lateral, and bilateral oblique views were obtained. There are 5 non-rib-bearing lumbar type vertebral bodies. There are multiple postoperative laminectomy defects throughout the lumbar region. The patient is status post posterior screw and  plate fixation at L3, L4, L5, and S1 bilaterally. Screw tips are in respective vertebral bodies. There is no acute fracture or spondylolisthesis. The disc spaces appear normal. There is no appreciable facet arthropathy. There is aortic  atherosclerosis. There are surgical clips in the left abdomen posteriorly. IMPRESSION: Postoperative change at multiple levels. No fracture or spondylolisthesis. No appreciable disc space narrowing. There is aortic atherosclerosis. Aortic Atherosclerosis (ICD10-I70.0). Electronically Signed   By: Bretta Bang III M.D.   On: 08/22/2018 20:19   Dg Ankle Complete Left  Result Date: 08/22/2018 CLINICAL DATA:  Left ankle pain after fall EXAM: LEFT ANKLE COMPLETE - 3+ VIEW COMPARISON:  None. FINDINGS: Vascular calcifications.  No bony abnormalities noted. IMPRESSION: No acute abnormalities. Electronically Signed   By: Gerome Sam III M.D   On: 08/22/2018 23:20   Ct Hip Left Wo Contrast  Result Date: 08/22/2018 CLINICAL DATA:  Rule out left hip fracture based on x-ray. EXAM: CT OF THE LEFT HIP WITHOUT CONTRAST TECHNIQUE: Multidetector CT imaging of the left hip was performed according to the standard protocol. Multiplanar CT image reconstructions were also generated. COMPARISON:  None. FINDINGS: No fracture or dislocation. Linear sclerosis through the femoral neck is consistent with previous instrumentation. IMPRESSION: No fracture or dislocation. Electronically Signed   By: Gerome Sam III M.D   On: 08/22/2018 22:12   Dg Knee Complete 4 Views Left  Result Date: 08/22/2018 CLINICAL DATA:  Pain after fall EXAM: LEFT KNEE - COMPLETE 4+ VIEW COMPARISON:  None. FINDINGS: Vascular calcifications. Mild patellofemoral compartment degenerative changes. No acute fractures identified. No joint effusion. IMPRESSION: Negative. Electronically Signed   By: Gerome Sam III M.D   On: 08/22/2018 20:23   Dg Knee Complete 4 Views Right  Result Date: 08/22/2018 CLINICAL DATA:  Pain after fall EXAM: RIGHT KNEE - COMPLETE 4+ VIEW COMPARISON:  None. FINDINGS: The patient is status post knee replacement. There is a fracture through the distal femur extending to the level of the femoral component. The fracture  is comminuted as seen on the third image. Apparent callus formation in the mid femur may be from previous fracture. No other acute abnormalities identified. IMPRESSION: Comminuted distal femoral fracture extending into the femoral component of the knee replacement. Electronically Signed   By: Gerome Sam III M.D   On: 08/22/2018 20:29   Dg Hip Unilat W Or Wo Pelvis 2-3 Views Left  Result Date: 08/22/2018 CLINICAL DATA:  Pain after fall EXAM: DG HIP (WITH OR WITHOUT PELVIS) 2-3V LEFT COMPARISON:  None. FINDINGS: Linear high attenuation in the femoral neck is likely from prior instrumentation. Lucency through the greater trochanter on the third image is noted. No other acute abnormalities. Previous right hip replacement. IMPRESSION: 1. Subtle linear lucency through the greater trochanter on only one view. A subtle nondisplaced fracture is not excluded. CT imaging could better evaluate. No other abnormalities. Electronically Signed   By: Gerome Sam III M.D   On: 08/22/2018 20:26   Dg Hip Unilat W Or Wo Pelvis 2-3 Views Right  Result Date: 08/22/2018 CLINICAL DATA:  PAIN FOLLOWING FALL EXAM: DG HIP (WITH OR WITHOUT PELVIS) BILATERALLY: 4+ VIEWS COMPARISON:  PELVIS AND RIGHT HIP May 29, 2017 FINDINGS: Frontal pelvis as well as frontal and lateral views of each hip joint obtained. There is no appreciable fracture or dislocation. There is evidence of prior postoperative change on the left, stable. There is a total hip replacement on the right with prosthetic components well-seated. There is mild narrowing  of the left hip joint. No erosive changes. There are surgical clips in the pelvis. There is postoperative change in the lower lumbar spine and upper sacrum. There are foci of arterial vascular calcification. IMPRESSION: No acute fracture or dislocation. Postoperative change on the left with stable appearance. Total hip replacement on the right with prosthetic components well-seated. Mild narrowing  left hip joint. Postoperative change lower lumbar spine and upper sacrum. Foci of arterial vascular calcification noted. Electronically Signed   By: Bretta BangWilliam  Woodruff III M.D.   On: 08/22/2018 20:23   Dg Femur, Min 2 Views Right  Result Date: 08/23/2018 CLINICAL DATA:  Known distal right femoral fracture EXAM: RIGHT FEMUR 2 VIEWS COMPARISON:  05/29/2017 FINDINGS: Right hip replacement is again identified. No dislocation is noted. Prior healed fracture in the midshaft of the right femur is noted. The distal femoral fracture is again identified extending to the level of prosthesis similar to that seen on the prior knee x-ray. A fat fluid level is noted in the suprapatellar bursa consistent with the fracture. IMPRESSION: Distal femoral fracture similar to that seen on prior knee exam. A fat fluid level is noted within the right knee joint. Electronically Signed   By: Alcide CleverMark  Lukens M.D.   On: 08/23/2018 13:25   ASSESSMENT AND PLAN:   Acute right distal femur fracture- s/p mechanical fall -Ortho following -Knee immobilizer -PT consult -Pain control -Plan for discharge to SNF when able  Mild hyperkalemia- K 5.2, be related to worsening kidney function in the setting of losartan -Will give Veltassa x1 -Hold losartan -Recheck in the morning  CKD 3 with solitary kidney-  Creatinine is at baseline.  -Monitor -Avoid nephrotoxic agents  Hypertension- normotensive -Holding losartan as above -Continue metoprolol  Type 2 diabetes- sugars well controlled -Continue NovoLog 70/30 and sensitive SSI  History of PE -Continue Xarelto.  All the records are reviewed and case discussed with Care Management/Social Worker. Management plans discussed with the patient, family and they are in agreement.  CODE STATUS: Full Code  TOTAL TIME TAKING CARE OF THIS PATIENT: 35 minutes.   More than 50% of the time was spent in counseling/coordination of care: YES  POSSIBLE D/C IN 1-2 DAYS, DEPENDING ON CLINICAL  CONDITION.   Jinny BlossomKaty D Shania Bjelland M.D on 08/23/2018 at 2:28 PM  Between 7am to 6pm - Pager [phone]- [phone]  After 6pm go to [email or web address] - Social research officer, governmentpassword EPAS [facility]  [facility] [place] [facility]  Office  [phone]  CC: Primary care physician; Marguarite ArbourSparks, [name], MD  Note: This dictation was prepared with Dragon dictation along with smaller phrase technology. Any transcriptional errors that result from this process are unintentional.

## 2018-08-23 NOTE — ED Notes (Signed)
Report was called and given to the floor at this time 

## 2018-08-23 NOTE — Progress Notes (Signed)
Pt to X ray with transport.

## 2018-08-23 NOTE — Clinical Social Work Placement (Signed)
   CLINICAL SOCIAL WORK PLACEMENT  NOTE  Date:  08/23/2018  Patient Details  Name: Gary Thomas MRN: [id number] Date of Birth: 1932-09-02  Clinical Social Work is seeking post-discharge placement for this patient at the Skilled  Nursing Facility level of care (*CSW will initial, date and re-position this form in  chart as items are completed):  Yes   Patient/family provided with [place] Clinical Social Work Department's list of facilities offering this level of care within the geographic area requested by the patient (or if unable, by the patient's family).  Yes   Patient/family informed of their freedom to choose among providers that offer the needed level of care, that participate in Medicare, Medicaid or managed care program needed by the patient, have an available bed and are willing to accept the patient.  Yes   Patient/family informed of [place]'s ownership interest in [facility] and [facility], as well as of the fact that they are under no obligation to receive care at these facilities.  PASRR submitted to EDS on       PASRR number received on       Existing PASRR number confirmed on 08/23/18     FL2 transmitted to all facilities in geographic area requested by pt/family on 08/23/18     FL2 transmitted to all facilities within larger geographic area on       Patient informed that his/her managed care company has contracts with or will negotiate with certain facilities, including the following:            Patient/family informed of bed offers received.  Patient chooses bed at       Physician recommends and patient chooses bed at      Patient to be transferred to   on  .  Patient to be transferred to facility by       Patient family notified on   of transfer.  Name of family member notified:        PHYSICIAN       Additional Comment:    _______________________________________________ [facility], Darleen Crocker, LCSW 08/23/2018, 2:22 PM

## 2018-08-23 NOTE — NC FL2 (Signed)
[place] MEDICAID FL2 LEVEL OF CARE SCREENING TOOL     IDENTIFICATION  Patient Name: Gary Thomas Birthdate: 06/08/1932 Sex: male Admission Date (Current Location): 08/22/2018  [place]   Facility and Address:  [facility], [address], [place], [place] [address]      Provider Number: [id number]  Attending Physician Name and Address:  Campbell StallMayo, Katy Dodd, MD  Relative Name and Phone Number:       Current Level of Care: Hospital Recommended Level of Care: Skilled Nursing Facility Prior Approval Number:    Date Approved/Denied:   PASRR Number: [id number])  Discharge Plan: SNF    Current Diagnoses: Patient Active Problem List   Diagnosis Date Noted  . Femur fracture (HCC) 08/22/2018  . Spondylolisthesis of lumbar region 02/15/2018  . PAD (peripheral artery disease) (HCC) 08/19/2017  . Hyperlipidemia 07/05/2017  . Essential hypertension 07/05/2017  . Bilateral carotid artery stenosis 07/05/2017  . Primary localized osteoarthritis of right hip 03/02/2017    Orientation RESPIRATION BLADDER Height & Weight     Self, Situation, Time, Place  Normal Continent Weight: 212 lb (96.2 kg) Height:  5\' 9"  (175.3 cm)  BEHAVIORAL SYMPTOMS/MOOD NEUROLOGICAL BOWEL NUTRITION STATUS      Continent Diet(Diet: Heart Healthy/ Card Modified. )  AMBULATORY STATUS COMMUNICATION OF NEEDS Skin   Extensive Assist Verbally Surgical wounds                       Personal Care Assistance Level of Assistance  Bathing, Feeding, Dressing Bathing Assistance: Limited assistance Feeding assistance: Independent Dressing Assistance: Limited assistance     Functional Limitations Info  Sight, Hearing, Speech Sight Info: Adequate Hearing Info: Adequate Speech Info: Adequate    SPECIAL CARE FACTORS FREQUENCY  PT (By licensed PT), OT (By licensed OT)     PT Frequency: (5) OT Frequency: (5)            Contractures       Additional Factors Info  Code Status Code Status Info: (Full Code. )             Current Medications (08/23/2018):  This is the current hospital active medication list Current Facility-Administered Medications  Medication Dose Route Frequency Provider Last Rate Last Dose  . acetaminophen (TYLENOL) tablet 650 mg  650 mg Oral Q6H PRN Cammy CopaMaier, Angela, MD       Or  . acetaminophen (TYLENOL) suppository 650 mg  650 mg Rectal Q6H PRN Cammy CopaMaier, Angela, MD      . atorvastatin (LIPITOR) tablet 20 mg  20 mg Oral QHS Cammy CopaMaier, Angela, MD   20 mg at 08/23/18 0159  . bisacodyl (DULCOLAX) EC tablet 5 mg  5 mg Oral Daily PRN Cammy CopaMaier, Angela, MD      . diclofenac sodium (VOLTAREN) 1 % transdermal gel 1 application  1 application Topical QID PRN Cammy CopaMaier, Angela, MD      . docusate sodium (COLACE) capsule 100 mg  100 mg Oral BID Cammy CopaMaier, Angela, MD   100 mg at 08/23/18 0926  . furosemide (LASIX) tablet 40 mg  40 mg Oral QHS Cammy CopaMaier, Angela, MD   40 mg at 08/23/18 0159  . HYDROmorphone (DILAUDID) injection 1 mg  1 mg Intravenous Q3H PRN Barbaraann RondoSridharan, Prasanna, MD   1 mg at 08/23/18 1238  . insulin aspart (novoLOG) injection 0-5 Units  0-5 Units Subcutaneous QHS Cammy CopaMaier, Angela, MD      . insulin aspart (novoLOG) injection 0-9 Units  0-9 Units Subcutaneous TID WC Cammy Copa, MD      . insulin aspart protamine- aspart (NOVOLOG MIX 70/30) injection 15 Units  15 Units Subcutaneous BID WC Mayo, Allyn Kenner, MD      . metoprolol succinate (TOPROL-XL) 24 hr tablet 100 mg  100 mg Oral QHS Cammy Copa, MD   100 mg at 08/23/18 0200  . multivitamin-lutein (OCUVITE-LUTEIN) capsule 1 capsule  1 capsule Oral QHS Cammy Copa, MD   1 capsule at 08/23/18 0200  . ondansetron (ZOFRAN) tablet 4 mg  4 mg Oral Q6H PRN Cammy Copa, MD       Or  . ondansetron [facility]) injection 4 mg  4 mg Intravenous Q6H PRN Cammy Copa, MD      . pantoprazole (PROTONIX) EC tablet 40 mg  40 mg Oral Daily Cammy Copa, MD   40 mg at 08/23/18 0926  .  patiromer Lelon Perla) packet 8.4 g  8.4 g Oral Once Mayo, Allyn Kenner, MD      . Rivaroxaban Carlena Hurl) tablet 15 mg  15 mg Oral Q supper Mayo, Allyn Kenner, MD      . sertraline (ZOLOFT) tablet 100 mg  100 mg Oral QHS Cammy Copa, MD   100 mg at 08/23/18 0159  . traZODone (DESYREL) tablet 25 mg  25 mg Oral QHS PRN Cammy Copa, MD         Discharge Medications: Please see discharge summary for a list of discharge medications.  Relevant Imaging Results:  Relevant Lab Results:   Additional Information (SSN: [id number])  Jakeria Caissie, Darleen Crocker, LCSW

## 2018-08-24 ENCOUNTER — Inpatient Hospital Stay: Payer: Medicare Other | Admitting: Anesthesiology

## 2018-08-24 ENCOUNTER — Inpatient Hospital Stay: Payer: Medicare Other

## 2018-08-24 ENCOUNTER — Encounter
Admission: EM | Disposition: A | Discharge status: Skilled Nursing Facility | Payer: Self-pay | Source: Home / Self Care | Attending: Internal Medicine

## 2018-08-24 ENCOUNTER — Encounter
Admission: RE | Admit: 2018-08-24 | Discharge: 2018-08-24 | Disposition: A | Discharge status: Home / Self Care | Payer: Medicare Other | Source: Ambulatory Visit | Attending: Internal Medicine | Admitting: Internal Medicine

## 2018-08-24 HISTORY — PX: ORIF FEMUR FRACTURE: SHX2119

## 2018-08-24 LAB — GLUCOSE, CAPILLARY
GLUCOSE-CAPILLARY: 113 mg/dL — AB (ref 70–99)
GLUCOSE-CAPILLARY: 150 mg/dL — AB (ref 70–99)
GLUCOSE-CAPILLARY: 162 mg/dL — AB (ref 70–99)
Glucose-Capillary: 121 mg/dL — ABNORMAL HIGH (ref 70–99)

## 2018-08-24 LAB — BASIC METABOLIC PANEL
ANION GAP: 6 (ref 5–15)
BUN: 35 mg/dL — ABNORMAL HIGH (ref 8–23)
CALCIUM: 8.9 mg/dL (ref 8.9–10.3)
CO2: 27 mmol/L (ref 22–32)
CREATININE: 2.04 mg/dL — AB (ref 0.61–1.24)
Chloride: 109 mmol/L (ref 98–111)
GFR, EST AFRICAN AMERICAN: 33 mL/min — AB (ref >60)
GFR, EST NON AFRICAN AMERICAN: 28 mL/min — AB (ref >60)
GLUCOSE: 126 mg/dL — AB (ref 70–99)
Potassium: 5.2 mmol/L — ABNORMAL HIGH (ref 3.5–5.1)
Sodium: 142 mmol/L (ref 135–145)

## 2018-08-24 LAB — CBC
HCT: 26.4 % — ABNORMAL LOW (ref 39.0–52.0)
Hemoglobin: 7.6 g/dL — ABNORMAL LOW (ref 13.0–17.0)
MCH: 24.1 pg — AB (ref 26.0–34.0)
MCHC: 28.8 g/dL — ABNORMAL LOW (ref 30.0–36.0)
MCV: 83.5 fL (ref 80.0–100.0)
NRBC: 0 % (ref 0.0–0.2)
PLATELETS: 189 10*3/uL (ref 150–400)
RBC: 3.16 MIL/uL — AB (ref 4.22–5.81)
RDW: 15.8 % — AB (ref 11.5–15.5)
WBC: 12.1 10*3/uL — ABNORMAL HIGH (ref 4.0–10.5)

## 2018-08-24 SURGERY — OPEN REDUCTION INTERNAL FIXATION (ORIF) DISTAL FEMUR FRACTURE
Anesthesia: General | Site: Leg Upper | Laterality: Right

## 2018-08-24 MED ORDER — ROCURONIUM BROMIDE 100 MG/10ML IV SOLN
INTRAVENOUS | Status: DC | PRN
  Administered 2018-08-24: 50 mg via INTRAVENOUS

## 2018-08-24 MED ORDER — SODIUM CHLORIDE (PF) 0.9 % IJ SOLN
INTRAMUSCULAR | Status: AC
  Filled 2018-08-24: qty 20

## 2018-08-24 MED ORDER — SUCCINYLCHOLINE CHLORIDE 20 MG/ML IJ SOLN
INTRAMUSCULAR | Status: AC
  Filled 2018-08-24: qty 1

## 2018-08-24 MED ORDER — ROCURONIUM BROMIDE 50 MG/5ML IV SOLN
INTRAVENOUS | Status: AC
  Filled 2018-08-24: qty 1

## 2018-08-24 MED ORDER — METOCLOPRAMIDE HCL 5 MG/ML IJ SOLN: 5.0000 mg | Freq: Three times a day (TID) | INTRAMUSCULAR | Status: DC | PRN

## 2018-08-24 MED ORDER — GLYCOPYRROLATE 0.2 MG/ML IJ SOLN
INTRAMUSCULAR | Status: DC | PRN
  Administered 2018-08-24: 0.4 mg via INTRAVENOUS

## 2018-08-24 MED ORDER — PHENYLEPHRINE HCL 10 MG/ML IJ SOLN
INTRAMUSCULAR | Status: DC | PRN
  Administered 2018-08-24: 300 ug via INTRAVENOUS

## 2018-08-24 MED ORDER — ONDANSETRON HCL 4 MG PO TABS: 4.0000 mg | ORAL_TABLET | Freq: Four times a day (QID) | ORAL | Status: DC | PRN

## 2018-08-24 MED ORDER — NEOSTIGMINE METHYLSULFATE 10 MG/10ML IV SOLN
INTRAVENOUS | Status: DC | PRN
  Administered 2018-08-24: 3 mg via INTRAVENOUS

## 2018-08-24 MED ORDER — MAGNESIUM HYDROXIDE 400 MG/5ML PO SUSP
30.0000 mL | Freq: Every day | ORAL | Status: DC | PRN
  Administered 2018-08-26: 30 mL via ORAL
  Filled 2018-08-24: qty 30

## 2018-08-24 MED ORDER — EPHEDRINE SULFATE 50 MG/ML IJ SOLN
INTRAMUSCULAR | Status: AC
  Filled 2018-08-24: qty 1

## 2018-08-24 MED ORDER — PATIROMER SORBITEX CALCIUM 8.4 G PO PACK
8.4000 g | PACK | Freq: Every day | ORAL | Status: DC
  Administered 2018-08-25 – 2018-08-26 (×2): 8.4 g via ORAL
  Filled 2018-08-24 (×3): qty 1

## 2018-08-24 MED ORDER — DIPHENHYDRAMINE HCL 12.5 MG/5ML PO ELIX: 12.5000 mg | ORAL_SOLUTION | ORAL | Status: DC | PRN

## 2018-08-24 MED ORDER — ONDANSETRON HCL 4 MG/2ML IJ SOLN: 4.0000 mg | Freq: Once | INTRAMUSCULAR | Status: DC | PRN

## 2018-08-24 MED ORDER — EPHEDRINE SULFATE 50 MG/ML IJ SOLN
INTRAMUSCULAR | Status: DC | PRN
  Administered 2018-08-24 (×2): 10 mg via INTRAVENOUS

## 2018-08-24 MED ORDER — TRAMADOL HCL 50 MG PO TABS
50.0000 mg | ORAL_TABLET | Freq: Four times a day (QID) | ORAL | Status: DC | PRN
  Administered 2018-08-26 (×2): 50 mg via ORAL
  Filled 2018-08-24 (×2): qty 1

## 2018-08-24 MED ORDER — ENOXAPARIN SODIUM 40 MG/0.4ML ~~LOC~~ SOLN
40.0000 mg | SUBCUTANEOUS | Status: DC
  Administered 2018-08-25: 40 mg via SUBCUTANEOUS
  Filled 2018-08-24: qty 0.4

## 2018-08-24 MED ORDER — DEXAMETHASONE SODIUM PHOSPHATE 10 MG/ML IJ SOLN
INTRAMUSCULAR | Status: DC | PRN
  Administered 2018-08-24: 10 mg via INTRAVENOUS

## 2018-08-24 MED ORDER — NEOSTIGMINE METHYLSULFATE 10 MG/10ML IV SOLN
INTRAVENOUS | Status: AC
  Filled 2018-08-24: qty 1

## 2018-08-24 MED ORDER — ACETAMINOPHEN 10 MG/ML IV SOLN
INTRAVENOUS | Status: DC | PRN
  Administered 2018-08-24: 1000 mg via INTRAVENOUS

## 2018-08-24 MED ORDER — CEFAZOLIN SODIUM-DEXTROSE 2-3 GM-%(50ML) IV SOLR
INTRAVENOUS | Status: DC | PRN
  Administered 2018-08-24: 2 g via INTRAVENOUS

## 2018-08-24 MED ORDER — SODIUM CHLORIDE 0.9 % IV SOLN
INTRAVENOUS | Status: DC
  Administered 2018-08-24: 21:00:00 via INTRAVENOUS

## 2018-08-24 MED ORDER — NEOMYCIN-POLYMYXIN B GU 40-200000 IR SOLN
Status: DC | PRN
  Administered 2018-08-24: 4 mL

## 2018-08-24 MED ORDER — SODIUM CHLORIDE 0.9 % IV SOLN
INTRAVENOUS | Status: DC
  Administered 2018-08-24 (×2): via INTRAVENOUS

## 2018-08-24 MED ORDER — LIDOCAINE HCL (CARDIAC) PF 100 MG/5ML IV SOSY
PREFILLED_SYRINGE | INTRAVENOUS | Status: DC | PRN
  Administered 2018-08-24: 100 mg via INTRAVENOUS

## 2018-08-24 MED ORDER — FLEET ENEMA 7-19 GM/118ML RE ENEM: 1.0000 | ENEMA | Freq: Once | RECTAL | Status: DC | PRN

## 2018-08-24 MED ORDER — BISACODYL 10 MG RE SUPP
10.0000 mg | Freq: Every day | RECTAL | Status: DC | PRN
  Administered 2018-08-26: 10 mg via RECTAL
  Filled 2018-08-24: qty 1

## 2018-08-24 MED ORDER — HYDROMORPHONE HCL 1 MG/ML IJ SOLN
0.5000 mg | INTRAMUSCULAR | Status: DC | PRN
  Administered 2018-08-25: 1 mg via INTRAVENOUS
  Filled 2018-08-24: qty 1

## 2018-08-24 MED ORDER — FENTANYL CITRATE (PF) 100 MCG/2ML IJ SOLN
INTRAMUSCULAR | Status: AC
  Filled 2018-08-24: qty 2

## 2018-08-24 MED ORDER — FENTANYL CITRATE (PF) 100 MCG/2ML IJ SOLN: 25.0000 ug | INTRAMUSCULAR | Status: DC | PRN

## 2018-08-24 MED ORDER — ONDANSETRON HCL 4 MG/2ML IJ SOLN
INTRAMUSCULAR | Status: AC
  Filled 2018-08-24: qty 2

## 2018-08-24 MED ORDER — ONDANSETRON HCL 4 MG/2ML IJ SOLN
INTRAMUSCULAR | Status: DC | PRN
  Administered 2018-08-24: 4 mg via INTRAVENOUS

## 2018-08-24 MED ORDER — HALOPERIDOL LACTATE 5 MG/ML IJ SOLN
2.0000 mg | Freq: Four times a day (QID) | INTRAMUSCULAR | Status: DC | PRN
  Administered 2018-08-24: 2 mg via INTRAVENOUS
  Filled 2018-08-24: qty 1

## 2018-08-24 MED ORDER — FENTANYL CITRATE (PF) 100 MCG/2ML IJ SOLN
INTRAMUSCULAR | Status: DC | PRN
  Administered 2018-08-24: 12.5 ug via INTRAVENOUS
  Administered 2018-08-24: 50 ug via INTRAVENOUS
  Administered 2018-08-24: 12.5 ug via INTRAVENOUS
  Administered 2018-08-24: 50 ug via INTRAVENOUS

## 2018-08-24 MED ORDER — DOCUSATE SODIUM 100 MG PO CAPS
100.0000 mg | ORAL_CAPSULE | Freq: Two times a day (BID) | ORAL | Status: DC
  Administered 2018-08-24 – 2018-08-26 (×4): 100 mg via ORAL
  Filled 2018-08-24 (×4): qty 1

## 2018-08-24 MED ORDER — PHENYLEPHRINE HCL 10 MG/ML IJ SOLN
INTRAMUSCULAR | Status: AC
  Filled 2018-08-24: qty 1

## 2018-08-24 MED ORDER — LIDOCAINE HCL (PF) 2 % IJ SOLN
INTRAMUSCULAR | Status: AC
  Filled 2018-08-24: qty 10

## 2018-08-24 MED ORDER — CEFAZOLIN SODIUM 1 G IJ SOLR
INTRAMUSCULAR | Status: AC
  Filled 2018-08-24: qty 20

## 2018-08-24 MED ORDER — ACETAMINOPHEN 10 MG/ML IV SOLN
INTRAVENOUS | Status: AC
  Filled 2018-08-24: qty 100

## 2018-08-24 MED ORDER — ACETAMINOPHEN 500 MG PO TABS
1000.0000 mg | ORAL_TABLET | Freq: Four times a day (QID) | ORAL | Status: AC
  Administered 2018-08-25 (×4): 1000 mg via ORAL
  Filled 2018-08-24 (×5): qty 2

## 2018-08-24 MED ORDER — ONDANSETRON HCL 4 MG/2ML IJ SOLN: 4.0000 mg | Freq: Four times a day (QID) | INTRAMUSCULAR | Status: DC | PRN

## 2018-08-24 MED ORDER — PROPOFOL 10 MG/ML IV BOLUS
INTRAVENOUS | Status: AC
  Filled 2018-08-24: qty 20

## 2018-08-24 MED ORDER — METOCLOPRAMIDE HCL 10 MG PO TABS: 5.0000 mg | ORAL_TABLET | Freq: Three times a day (TID) | ORAL | Status: DC | PRN

## 2018-08-24 MED ORDER — ACETAMINOPHEN 325 MG PO TABS: 325.0000 mg | ORAL_TABLET | Freq: Four times a day (QID) | ORAL | Status: DC | PRN

## 2018-08-24 MED ORDER — PROPOFOL 10 MG/ML IV BOLUS
INTRAVENOUS | Status: DC | PRN
  Administered 2018-08-24: 130 mg via INTRAVENOUS

## 2018-08-24 MED ORDER — BUPIVACAINE-EPINEPHRINE 0.5% -1:200000 IJ SOLN
INTRAMUSCULAR | Status: DC | PRN
  Administered 2018-08-24: 30 mL

## 2018-08-24 MED ORDER — OXYCODONE HCL 5 MG PO TABS
5.0000 mg | ORAL_TABLET | ORAL | Status: DC | PRN
  Administered 2018-08-25 – 2018-08-26 (×3): 10 mg via ORAL
  Filled 2018-08-24 (×3): qty 2

## 2018-08-24 MED ORDER — GLYCOPYRROLATE 0.2 MG/ML IJ SOLN
INTRAMUSCULAR | Status: AC
  Filled 2018-08-24: qty 2

## 2018-08-24 MED ORDER — BUPIVACAINE-EPINEPHRINE (PF) 0.5% -1:200000 IJ SOLN
INTRAMUSCULAR | Status: AC
  Filled 2018-08-24: qty 30

## 2018-08-24 MED ORDER — DEXAMETHASONE SODIUM PHOSPHATE 10 MG/ML IJ SOLN
INTRAMUSCULAR | Status: AC
  Filled 2018-08-24: qty 1

## 2018-08-24 MED ORDER — CEFAZOLIN SODIUM-DEXTROSE 2-4 GM/100ML-% IV SOLN
2.0000 g | Freq: Four times a day (QID) | INTRAVENOUS | Status: AC
  Administered 2018-08-25 (×3): 2 g via INTRAVENOUS
  Filled 2018-08-24 (×3): qty 100

## 2018-08-24 MED ORDER — SODIUM CHLORIDE 0.9 % IV SOLN
INTRAVENOUS | Status: DC
  Administered 2018-08-25: 12:00:00 via INTRAVENOUS

## 2018-08-24 SURGICAL SUPPLY — 46 items
BANDAGE ACE 6X5 VEL STRL LF (GAUZE/BANDAGES/DRESSINGS) ×3 IMPLANT
BIT DRILL 4.3 (BIT) ×2 IMPLANT
BIT DRILL 4.3MM (BIT) ×1
BIT DRILL 4.3X300MM (BIT) IMPLANT
BNDG COHESIVE 6X5 TAN STRL LF (GAUZE/BANDAGES/DRESSINGS) ×3 IMPLANT
BRACE KNEE POST OP SHORT (BRACE) ×2 IMPLANT
CANISTER SUCT 1200ML W/VALVE (MISCELLANEOUS) ×3 IMPLANT
CAP LOCK NCB (Cap) ×10 IMPLANT
CHLORAPREP W/TINT 26ML (MISCELLANEOUS) ×3 IMPLANT
COVER WAND RF STERILE (DRAPES) ×3 IMPLANT
DRAPE C-ARM XRAY 36X54 (DRAPES) ×3 IMPLANT
DRAPE C-ARMOR (DRAPES) ×3 IMPLANT
DRAPE IMP U-DRAPE 54X76 (DRAPES) ×6 IMPLANT
DRAPE INCISE IOBAN 66X60 STRL (DRAPES) ×6 IMPLANT
DRAPE TABLE BACK 80X90 (DRAPES) ×3 IMPLANT
DRAPE U-SHAPE 47X51 STRL (DRAPES) ×3 IMPLANT
DRILL BIT 4.3 (BIT) ×2
ELECT REM PT RETURN 9FT ADLT (ELECTROSURGICAL) ×3
ELECTRODE REM PT RTRN 9FT ADLT (ELECTROSURGICAL) ×1 IMPLANT
GAUZE PETRO XEROFOAM 1X8 (MISCELLANEOUS) ×3 IMPLANT
GAUZE SPONGE 4X4 12PLY STRL (GAUZE/BANDAGES/DRESSINGS) ×3 IMPLANT
GLOVE BIO SURGEON STRL SZ8 (GLOVE) ×6 IMPLANT
GLOVE INDICATOR 8.0 STRL GRN (GLOVE) ×3 IMPLANT
GOWN STRL REUS W/ TWL LRG LVL3 (GOWN DISPOSABLE) ×1 IMPLANT
GOWN STRL REUS W/ TWL XL LVL3 (GOWN DISPOSABLE) ×1 IMPLANT
GOWN STRL REUS W/TWL LRG LVL3 (GOWN DISPOSABLE) ×2
GOWN STRL REUS W/TWL XL LVL3 (GOWN DISPOSABLE) ×2
HEMOVAC 400CC 10FR (MISCELLANEOUS) ×3 IMPLANT
K-WIRE 2.0 (WIRE) ×4
K-WIRE FXSTD 280X2XNS SS (WIRE) ×2
KIT TURNOVER KIT A (KITS) ×3 IMPLANT
KWIRE FXSTD 280X2XNS SS (WIRE) IMPLANT
NS IRRIG 1000ML POUR BTL (IV SOLUTION) ×3 IMPLANT
PACK HIP PROSTHESIS (MISCELLANEOUS) ×3 IMPLANT
PLATE FEMUR DISTAL NCB 5H (Plate) ×2 IMPLANT
SCREW 5.0 80MM (Screw) ×2 IMPLANT
SCREW CORT NCB SELFTAP 5.0X50 (Screw) ×2 IMPLANT
SCREW NCB 5.0X46MM (Screw) ×2 IMPLANT
SCREW NCB 5.0X55MM (Screw) ×4 IMPLANT
SCREW NCB 5.0X85MM (Screw) ×8 IMPLANT
SPONGE LAP 18X18 RF (DISPOSABLE) ×3 IMPLANT
STAPLER SKIN PROX 35W (STAPLE) ×3 IMPLANT
STOCKINETTE M/LG 89821 (MISCELLANEOUS) ×3 IMPLANT
SUT VIC AB 2-0 CT1 27 (SUTURE) ×4
SUT VIC AB 2-0 CT1 TAPERPNT 27 (SUTURE) ×2 IMPLANT
TAPE MICROFOAM 4IN (TAPE) ×3 IMPLANT

## 2018-08-24 NOTE — Anesthesia Post-op Follow-up Note (Signed)
Anesthesia QCDR form completed.        

## 2018-08-24 NOTE — Op Note (Signed)
08/24/2018  7:24 PM  Patient:   Gary Thomas  Pre-Op Diagnosis:   Closed nondisplaced periprosthetic right supracondylar femur fracture.  Post-Op Diagnosis:   Same  Procedure:   Open reduction and internal fixation of periprosthetic right supracondylar femur fracture.  Surgeon:   Maryagnes Amos, MD  Assistant:   Joni Fears, PA-S  Anesthesia:   GET  Findings:   As above.  Complications:   None  Fluids:   1000 cc crystalloid  EBL:   20 cc  UOP:   None  TT:   87 minutes at 300 mmHg  Drains:   None  Closure:   Staples  Implants:   [employer] 5 hole precontoured supracondylar femoral plate with 5 distal screws with locking caps and 4 bicortical proximal screws  Brief Clinical Note:   The patient is an 84 year old male who sustained the above-noted injury 2 days ago when he apparently lost his balance and fell while getting up from the kitchen table, injuring his right knee.  He presented to the emergency room where x-rays demonstrated an essentially nondisplaced supracondylar right femur fracture just proximal to a previously placed right total knee arthroplasty.  X-rays show that the prosthesis remain in satisfactory position and without evidence of loosening.  The patient has been cleared medically and presents at this time for definitive management of his injury.  Procedure:   The patient was brought into the operating room and lain in the supine position.  After adequate general endotracheal intubation and anesthesia were obtained, the patient's right lower extremity was prepped with ChloraPrep solution before being draped sterilely.  Preoperative antibiotics were administered.  A timeout was performed to verify the appropriate surgical site before a sterile tourniquet was applied to the upper thigh.  The limb was exsanguinated with an Esmarch and the tourniquet inflated to 300 mmHg.  Utilizing the distal end of the prior lateral thigh incision a surgical incision was  made and it was extended distally to the lateral epicondyle.  The incision was carried down through the subcutaneous tissues to expose the iliotibial band.  This was split the length of the incision to expose the lateral aspect of the lateral femoral condyle.  Given that the patient had a of a prior midshaft femur fracture resulting in reverse of the normal anterior bowing of the femur, it was felt that a long plate would not fit appropriately.  Therefore, it was elected to go with the short plate which would and distal to the site of the malunion.  The plate was carefully positioned utilizing fluoroscopic imaging in AP and lateral projections before it was temporally secured using K wires proximally and distally.  Care was taken to be sure that the distal screws would avoid the pegs in the femoral component of the total knee arthroplasty.  Distally, the plate was secured using five fully threaded cancellus screws.  The adequacy of screw position was verified fluoroscopically in AP and lateral projections and found to be excellent.  Each of the screws was covered with a locking cap.  Proximally, the plate was secured using four bicortical screws of appropriate length.  The most proximal two screws were placed through stab incisions while the more distal to proximal screws were placed through the original incision.  Again the adequacy of hardware position and overall femoral alignment/fracture reduction was verified fluoroscopically in AP and lateral projections and found to be excellent.  The wound was copiously irrigated with bacitracin saline solution using bulb irrigation  before the iliotibial band was reapproximated using #1 Vicryl interrupted sutures.  The subcutaneous tissues were closed in two layers using 2-0 Vicryl interrupted sutures before the skin was closed with staples.  A total of 30 cc of 0.5% Sensorcaine with epinephrine was injected in around the incision site to help with postoperative  analgesia before a sterile occlusive dressing was applied to the thigh.  The patient was then placed into a hinged knee brace with hinges set at 0 to 90 degrees but locked in extension.  The patient was then awakened, extubated, and returned to the recovery room in satisfactory condition after tolerating the procedure well.

## 2018-08-24 NOTE — Transfer of Care (Signed)
Immediate Anesthesia Transfer of Care Note  Patient: Gary Thomas  Procedure(s) Performed: OPEN REDUCTION INTERNAL FIXATION (ORIF) SUPRACONDYLAR FEMUR FRACTURE (Right Leg Upper)  Patient Location: PACU  Anesthesia Type:General  Level of Consciousness: awake and patient cooperative  Airway & Oxygen Therapy: Patient Spontanous Breathing and Patient connected to face mask oxygen  Post-op Assessment: Report given to RN and Post -op Vital signs reviewed and stable  Post vital signs: Reviewed and stable  Last Vitals:  Vitals Value Taken Time  BP 119/75 08/24/2018  7:30 PM  Temp    Pulse 72 08/24/2018  7:32 PM  Resp 20 08/24/2018  7:32 PM  SpO2 98 % 08/24/2018  7:32 PM  Vitals shown include unvalidated device data.  Last Pain:  Vitals:   08/24/18 0940  TempSrc:   PainSc: 4       Patients Stated Pain Goal: 1 (08/24/18 0910)  Complications: No apparent anesthesia complications

## 2018-08-24 NOTE — H&P (Signed)
The patient's H&P has been reviewed and patient re-examined. There are no changes.

## 2018-08-24 NOTE — Progress Notes (Signed)
Per patient son, patient has weakness in left leg and has issues with ambulation and movement prior to admission, may have difficulty with attempts to get out of bed

## 2018-08-24 NOTE — Anesthesia Preprocedure Evaluation (Signed)
Anesthesia Evaluation  Patient identified by MRN, date of birth, ID band Patient awake    Reviewed: Allergy & Precautions, NPO status , Patient's Chart, lab work & pertinent test results  History of Anesthesia Complications Negative for: history of anesthetic complications  Airway Mallampati: III       Dental  (+) Loose, Missing, Chipped   Pulmonary sleep apnea , neg COPD, former smoker,           Cardiovascular hypertension, Pt. on medications (-) Past MI and (-) CHF (-) dysrhythmias (-) Valvular Problems/Murmurs     Neuro/Psych neg Seizures Depression CVA (memory difficulties)    GI/Hepatic GERD  Medicated,  Endo/Other  diabetes, Type 2, Insulin Dependent  Renal/GU Renal InsufficiencyRenal disease     Musculoskeletal   Abdominal   Peds  Hematology   Anesthesia Other Findings   Reproductive/Obstetrics                             Anesthesia Physical Anesthesia Plan  ASA: III and emergent  Anesthesia Plan: General   Post-op Pain Management:    Induction:   PONV Risk Score and Plan: 2 and Dexamethasone and Ondansetron  Airway Management Planned: Oral ETT  Additional Equipment:   Intra-op Plan:   Post-operative Plan:   Informed Consent: I have reviewed the patients History and Physical, chart, labs and discussed the procedure including the risks, benefits and alternatives for the proposed anesthesia with the patient or authorized representative who has indicated his/her understanding and acceptance.     Plan Discussed with:   Anesthesia Plan Comments:         Anesthesia Quick Evaluation

## 2018-08-24 NOTE — Progress Notes (Signed)
Clinical Child psychotherapistocial Worker (CSW) presented bed offers to patient and his wife [name]. They chose [facility]. Per MD patient will have surgery today. [facility] and that patient has a urostomy.  Baker Hughes IncorporatedBailey Promiss Labarbera, LCSW [phone]

## 2018-08-24 NOTE — Anesthesia Postprocedure Evaluation (Signed)
Anesthesia Post Note  Patient: Surveyor, mineralsDanford R Licea  Procedure(s) Performed: OPEN REDUCTION INTERNAL FIXATION (ORIF) SUPRACONDYLAR FEMUR FRACTURE (Right Leg Upper)  Patient location during evaluation: PACU Anesthesia Type: General Level of consciousness: awake and alert Pain management: pain level controlled Vital Signs Assessment: post-procedure vital signs reviewed and stable Respiratory status: spontaneous breathing and respiratory function stable Cardiovascular status: stable Anesthetic complications: no     Last Vitals:  Vitals:   08/24/18 2015 08/24/18 2030  BP: (!) 146/64 (!) 124/52  Pulse: 68 66  Resp: 18 18  Temp:    SpO2: 95% 97%    Last Pain:  Vitals:   08/24/18 2030  TempSrc:   PainSc: Asleep                 Dyson Sevey K

## 2018-08-24 NOTE — Progress Notes (Signed)
[facility] - [place] at [facility]   PATIENT NAME: Leroy KennedyDanford Kindel    MR#:  [id number]  DATE OF BIRTH:  Mar 14, 1932  SUBJECTIVE:   He was bit confused this morning.  Occasionally saying things that do not make sense.  States he has continued right leg pain, but the pain meds are helping.  REVIEW OF SYSTEMS:  Review of Systems  Constitutional: Negative for chills and fever.  HENT: Negative for congestion and sore throat.   Eyes: Negative for blurred vision and double vision.  Respiratory: Negative for cough and shortness of breath.   Cardiovascular: Negative for chest pain, palpitations and leg swelling.  Gastrointestinal: Negative for abdominal pain, nausea and vomiting.  Genitourinary: Negative for dysuria and frequency.  Musculoskeletal: Positive for falls and joint pain.  Neurological: Negative for dizziness and headaches.  Psychiatric/Behavioral: Negative for depression. The patient is not nervous/anxious.     DRUG ALLERGIES:   Allergies  Allergen Reactions  . Codeine Anaphylaxis  . Morphine Anaphylaxis  . Gabapentin Other (See Comments)    Unsure of what the reaction was   . Stadol [Butorphanol]     Unknown  . Lyrica [Pregabalin] Rash  . Tapentadol Nausea Only    ONSET 01/04/2013   VITALS:  Blood pressure (!) 126/54, pulse 79, temperature (!) 97.5 F (36.4 C), temperature source Oral, resp. rate 18, height 5\' 9"  (1.753 m), weight 93.4 kg, SpO2 95 %. PHYSICAL EXAMINATION:  Physical Exam  GENERAL:  84 y.o.-year-old patient lying in the bed with severe distress, secondary to right knee pain.  EYES: Pupils equal, round, reactive to light and accommodation. No scleral icterus. Extraocular muscles intact.  HEENT: Head atraumatic, normocephalic. Oropharynx and nasopharynx clear. Dry mucous membranes. NECK:  Supple, no jugular venous distention. No thyroid enlargement, no tenderness.  LUNGS: Normal breath sounds bilaterally, no wheezing, rales,rhonchi  or crepitation. No use of accessory muscles of respiration.  CARDIOVASCULAR: RRR, S1, S2 normal. No S3/S4.  ABDOMEN: Soft, nontender, nondistended. Bowel sounds present. No organomegaly or mass.  EXTREMITIES: not moving right leg, no LE edema or cyanosis NEUROLOGIC EXAM: No focal deficits, sensation intact PSYCHIATRIC: The patient is alert and oriented only to person and place SKIN: No obvious rash, lesion, or ulcer.  LABORATORY PANEL:  Male CBC Recent Labs  Lab 08/24/18 0420  WBC 12.1*  HGB 7.6*  HCT 26.4*  PLT 189   ------------------------------------------------------------------------------------------------------------------ Chemistries  Recent Labs  Lab 08/22/18 2123  08/24/18 0420  NA 137   < > 142  K 5.0   < > 5.2*  CL 105   < > 109  CO2 26   < > 27  GLUCOSE 163*   < > 126*  BUN 30*   < > 35*  CREATININE 1.59*   < > 2.04*  CALCIUM 8.9   < > 8.9  AST 26  --   --   ALT 15  --   --   ALKPHOS 81  --   --   BILITOT 0.6  --   --    < > = values in this interval not displayed.   RADIOLOGY:  Dg Femur, Min 2 Views Right  Result Date: 08/23/2018 CLINICAL DATA:  Known distal right femoral fracture EXAM: RIGHT FEMUR 2 VIEWS COMPARISON:  05/29/2017 FINDINGS: Right hip replacement is again identified. No dislocation is noted. Prior healed fracture in the midshaft of the right femur is noted. The distal femoral fracture is again identified extending to the level of  prosthesis similar to that seen on the prior knee x-ray. A fat fluid level is noted in the suprapatellar bursa consistent with the fracture. IMPRESSION: Distal femoral fracture similar to that seen on prior knee exam. A fat fluid level is noted within the right knee joint. Electronically Signed   By: Alcide Clever M.D.   On: 08/23/2018 13:25   ASSESSMENT AND PLAN:   Acute right distal femur fracture- s/p mechanical fall -Ortho following- plan for surgery today -Knee immobilizer -PT consult -Pain control -Plan  for discharge to SNF when able  Mild hyperkalemia- persistent. K 5.2 again today, likely related to worsening kidney function in the setting of losartan -Will give another dose of Veltassa -Hold losartan -Start IVFs -Recheck in the morning   AKI in CKD 3 with solitary kidney- likely due to dehydration. Patient dry-appearing on exam. -Start IVFs -Recheck Cr in the morning -Holding losartan -Avoid nephrotoxic agents  Hypertension- normotensive -Holding losartan as above -Continue metoprolol  Type 2 diabetes- sugars well controlled -Continue Novolog 70/30 and sensitive SSI  History of PE -Holding Xarelto for surgery  All the records are reviewed and case discussed with Care Management/Social Worker. Management plans discussed with the patient, family and they are in agreement.  CODE STATUS: Full Code  TOTAL TIME TAKING CARE OF THIS PATIENT: 35 minutes.   More than 50% of the time was spent in counseling/coordination of care: YES  POSSIBLE D/C IN 1-2 DAYS, DEPENDING ON CLINICAL CONDITION.   Jinny Blossom Tabitha Tupper M.D on 08/24/2018 at 12:30 PM  Between 7am to 6pm - Pager - [phone]  After 6pm go to [email or web address] - Social research officer, government  [facility] [place] [facility]  Office  [phone]  CC: Primary care physician; Marguarite Arbour, MD  Note: This dictation was prepared with Dragon dictation along with smaller phrase technology. Any transcriptional errors that result from this process are unintentional.

## 2018-08-24 NOTE — Anesthesia Procedure Notes (Signed)
Procedure Name: Intubation Date/Time: 08/24/2018 5:16 PM Performed by: Doreen Salvage, CRNA Pre-anesthesia Checklist: Patient identified, Patient being monitored, Timeout performed, Emergency Drugs available and Suction available Patient Re-evaluated:Patient Re-evaluated prior to induction Oxygen Delivery Method: Circle system utilized Preoxygenation: Pre-oxygenation with 100% oxygen Induction Type: IV induction Ventilation: Mask ventilation without difficulty Laryngoscope Size: Mac, McGraph and 4 Grade View: Grade I Tube type: Oral Tube size: 7.5 mm Number of attempts: 1 Airway Equipment and Method: Stylet Placement Confirmation: ETT inserted through vocal cords under direct vision,  positive ETCO2 and breath sounds checked- equal and bilateral Secured at: 23 cm Tube secured with: Tape Dental Injury: Teeth and Oropharynx as per pre-operative assessment

## 2018-08-25 ENCOUNTER — Encounter: Payer: Self-pay | Admitting: Surgery

## 2018-08-25 LAB — GLUCOSE, CAPILLARY
GLUCOSE-CAPILLARY: 116 mg/dL — AB (ref 70–99)
GLUCOSE-CAPILLARY: 105 mg/dL — AB (ref 70–99)
Glucose-Capillary: 117 mg/dL — ABNORMAL HIGH (ref 70–99)
Glucose-Capillary: 133 mg/dL — ABNORMAL HIGH (ref 70–99)

## 2018-08-25 LAB — BASIC METABOLIC PANEL
Anion gap: 5 (ref 5–15)
BUN: 40 mg/dL — AB (ref 8–23)
CHLORIDE: 113 mmol/L — AB (ref 98–111)
CO2: 25 mmol/L (ref 22–32)
Calcium: 8.3 mg/dL — ABNORMAL LOW (ref 8.9–10.3)
Creatinine, Ser: 1.83 mg/dL — ABNORMAL HIGH (ref 0.61–1.24)
GFR calc Af Amer: 37 mL/min — ABNORMAL LOW (ref >60)
GFR calc non Af Amer: 32 mL/min — ABNORMAL LOW (ref >60)
GLUCOSE: 148 mg/dL — AB (ref 70–99)
Potassium: 4.7 mmol/L (ref 3.5–5.1)
Sodium: 143 mmol/L (ref 135–145)

## 2018-08-25 LAB — CBC
HCT: 23.8 % — ABNORMAL LOW (ref 39.0–52.0)
Hemoglobin: 6.7 g/dL — ABNORMAL LOW (ref 13.0–17.0)
MCH: 23.7 pg — ABNORMAL LOW (ref 26.0–34.0)
MCHC: 28.2 g/dL — ABNORMAL LOW (ref 30.0–36.0)
MCV: 84.1 fL (ref 80.0–100.0)
PLATELETS: 158 10*3/uL (ref 150–400)
RBC: 2.83 MIL/uL — AB (ref 4.22–5.81)
RDW: 15.6 % — AB (ref 11.5–15.5)
WBC: 9.4 10*3/uL (ref 4.0–10.5)
nRBC: 0 % (ref 0.0–0.2)

## 2018-08-25 LAB — IRON AND TIBC
IRON: 11 ug/dL — AB (ref 45–182)
Saturation Ratios: 4 % — ABNORMAL LOW (ref 17.9–39.5)
TIBC: 292 ug/dL (ref 250–450)
UIBC: 281 ug/dL

## 2018-08-25 LAB — HEMOGLOBIN AND HEMATOCRIT, BLOOD
HEMATOCRIT: 26.1 % — AB (ref 39.0–52.0)
Hemoglobin: 7.8 g/dL — ABNORMAL LOW (ref 13.0–17.0)

## 2018-08-25 LAB — FERRITIN: Ferritin: 20 ng/mL — ABNORMAL LOW (ref 24–336)

## 2018-08-25 LAB — PREPARE RBC (CROSSMATCH)

## 2018-08-25 MED ORDER — SODIUM CHLORIDE 0.9% IV SOLUTION: Freq: Once | INTRAVENOUS | Status: DC

## 2018-08-25 NOTE — Care Management Important Message (Signed)
Important Message  Patient Details  Name: Gary Thomas MRN: [id number] Date of Birth: May 10, 1932   Medicare Important Message Given:  Yes    Olegario MessierKathy A Aikeem Lilley 08/25/2018, 11:09 AM

## 2018-08-25 NOTE — Progress Notes (Signed)
Physical Therapy Treatment Patient Details Name: Gary Thomas MRN: [id number] DOB: 1932/09/24 Today's Date: 08/25/2018    History of Present Illness 84 y.o. male with a known history of diabetes type 2, kidney disease, hypertension, right-sided weakness status post remote stroke.  He also has h/o lumbar sx 5/19 with reported L side weakness.  He suffered fall at home was found to have R distal femur fx (involving previous total knee.  He is status post ORIF and is NWBing with locked KI donned.    PT Comments    Pt showed good effort with supine exercises with b/l LEs, however he remains limited with what he is able to tolerate with pain (and KI/NWBing) on R and baseline weakness on L.  Deferred mobility secondary to pt pain levels and need for max assist this AM.  He is willing to try getting up again tomorrow but was thankful to defer this session.  Pt generally weak and limited but focused on working hard and eager to get back to some level of function soon.    Follow Up Recommendations  SNF     Equipment Recommendations       Recommendations for Other Services       Precautions / Restrictions Precautions Precautions: Fall Restrictions RLE Weight Bearing: Non weight bearing    Mobility  Bed Mobility               General bed mobility comments: deferred mobility this PM secondary to pt pain level and the difficulty of doing so this AM  Transfers                    Ambulation/Gait                 Stairs             Wheelchair Mobility    Modified Rankin (Stroke Patients Only)       Balance                                            Cognition Arousal/Alertness: Awake/alert Behavior During Therapy: WFL for tasks assessed/performed Overall Cognitive Status: Within Functional Limits for tasks assessed                                        Exercises General Exercises - Lower Extremity Ankle  Circles/Pumps: 10 reps;Strengthening(no resisted PF on R) Quad Sets: Strengthening;10 reps Gluteal Sets: Strengthening;10 reps Short Arc Quad: AAROM;10 reps;Left(pt unable to initiate against gravity movement) Heel Slides: Left;AROM;10 reps Hip ABduction/ADduction: Strengthening;AROM;10 reps    General Comments        Pertinent Vitals/Pain Pain Score: 7  Pain Location: again very sensitive with even very minimalR LE movement    Home Living                      Prior Function            PT Goals (current goals can now be found in the care plan section) Progress towards PT goals: Progressing toward goals    Frequency    BID      PT Plan Current plan remains appropriate    Co-evaluation  AM-PAC PT "6 Clicks" Daily Activity  Outcome Measure  Difficulty turning over in bed (including adjusting bedclothes, sheets and blankets)?: Unable Difficulty moving from lying on back to sitting on the side of the bed? : Unable Difficulty sitting down on and standing up from a chair with arms (e.g., wheelchair, bedside commode, etc,.)?: Unable Help needed moving to and from a bed to chair (including a wheelchair)?: Total Help needed walking in hospital room?: Total Help needed climbing 3-5 steps with a railing? : Total 6 Click Score: 6    End of Session Equipment Utilized During Treatment: Right knee immobilizer Activity Tolerance: Patient limited by pain Patient left: with bed alarm set;with call bell/phone within reach;with family/visitor present Nurse Communication: Mobility status PT Visit Diagnosis: Difficulty in walking, not elsewhere classified (R26.2);Muscle weakness (generalized) (M62.81)     Time: 9528-4132 PT Time Calculation (min) (ACUTE ONLY): 24 min  Charges:  $Therapeutic Exercise: 23-37 mins                     Malachi Pro, DPT  08/25/2018, 4:35 PM

## 2018-08-25 NOTE — Progress Notes (Addendum)
[facility] - [place] at [facility]   PATIENT NAME: Gary Thomas    MR#:  [id number]  DATE OF BIRTH:  [birth year]  SUBJECTIVE:   Doing okay this morning.  Patient states that he felt pretty weak when he tried to get up with physical therapy.  He was unable to stand, and was only able to move around in the bed.  REVIEW OF SYSTEMS:  Review of Systems  Constitutional: Negative for chills and fever.  HENT: Negative for congestion and sore throat.   Eyes: Negative for blurred vision and double vision.  Respiratory: Negative for cough and shortness of breath.   Cardiovascular: Negative for chest pain, palpitations and leg swelling.  Gastrointestinal: Negative for abdominal pain, nausea and vomiting.  Genitourinary: Negative for dysuria and frequency.  Musculoskeletal: Positive for falls and joint pain.  Neurological: Negative for dizziness and headaches.  Psychiatric/Behavioral: Negative for depression. The patient is not nervous/anxious.     DRUG ALLERGIES:   Allergies  Allergen Reactions  . Codeine Anaphylaxis  . Morphine Anaphylaxis  . Gabapentin Other (See Comments)    Unsure of what the reaction was   . Stadol [Butorphanol]     Unknown  . Lyrica [Pregabalin] Rash  . Tapentadol Nausea Only    ONSET 01/04/2013   VITALS:  Blood pressure (!) 117/97, pulse 62, temperature 97.8 F (36.6 C), temperature source Oral, resp. rate 18, height 5\' 9"  (1.753 m), weight 109.8 kg, SpO2 99 %. PHYSICAL EXAMINATION:  Physical Exam  GENERAL:  84 y.o.-year-old patient lying in the bed in NAD EYES: Pupils equal, round, reactive to light and accommodation. No scleral icterus. Extraocular muscles intact.  HEENT: Head atraumatic, normocephalic. Oropharynx and nasopharynx clear. Dry mucous membranes. NECK:  Supple, no jugular venous distention. No thyroid enlargement, no tenderness.  LUNGS: Normal breath sounds bilaterally, no wheezing, rales,rhonchi or crepitation. No use of  accessory muscles of respiration.  CARDIOVASCULAR: RRR, S1, S2 normal. No S3/S4.  ABDOMEN: Soft, nontender, nondistended. Bowel sounds present. No organomegaly or mass.  EXTREMITIES: + Right leg immobilizer in place, no LE edema or cyanosis NEUROLOGIC EXAM: No focal deficits, sensation intact PSYCHIATRIC: The patient is alert and oriented only to person and place SKIN: No obvious rash, lesion, or ulcer.  LABORATORY PANEL:  Male CBC Recent Labs  Lab 08/25/18 0449  WBC 9.4  HGB 6.7*  HCT 23.8*  PLT 158   ------------------------------------------------------------------------------------------------------------------ Chemistries  Recent Labs  Lab 08/22/18 2123  08/25/18 0449  NA 137   < > 143  K 5.0   < > 4.7  CL 105   < > 113*  CO2 26   < > 25  GLUCOSE 163*   < > 148*  BUN 30*   < > 40*  CREATININE 1.59*   < > 1.83*  CALCIUM 8.9   < > 8.3*  AST 26  --   --   ALT 15  --   --   ALKPHOS 81  --   --   BILITOT 0.6  --   --    < > = values in this interval not displayed.   RADIOLOGY:  Dg Chest Port 1 View  Result Date: 08/24/2018 CLINICAL DATA:  84 year old male with dyspnea postop EXAM: PORTABLE CHEST 1 VIEW COMPARISON:  Prior chest x-ray 08/22/2018 FINDINGS: Low inspiratory volumes with bibasilar atelectasis. No evidence of pneumothorax, large effusion or pleural effusion. Surgical clips present in the right supraclavicular region and along the left neck. No acute  osseous abnormality. IMPRESSION: Low inspiratory volumes with bibasilar atelectasis. Otherwise, no acute cardiopulmonary process. Electronically Signed   By: Malachy Moan M.D.   On: 08/24/2018 20:19   Dg C-arm 1-60 Min  Result Date: 08/24/2018 CLINICAL DATA:  84 year old male status post ORIF of right distal femoral fracture in EXAM: OPERATIVE right femur TECHNIQUE: Fluoroscopic spot image(s) were submitted for interpretation post-operatively. COMPARISON:  Preoperative radiographs 08/23/2018 FINDINGS:  Intraoperative saved images demonstrate interval open reduction and internal fixation of the periprosthetic distal femoral fracture with a lateral buttress plate and screw construct. No evidence of immediate hardware complication. Additionally, patient has surgical changes consistent with prior total knee arthroplasty. IMPRESSION: ORIF of distal right femoral fracture with lateral buttress plate and screw construct. No immediate hardware complication. Electronically Signed   By: Malachy Moan M.D.   On: 08/24/2018 20:20   Dg Hip Operative Unilat W Or W/o Pelvis Right  Result Date: 08/24/2018 CLINICAL DATA:  84 year old male status post ORIF of right distal femoral fracture in EXAM: OPERATIVE right femur TECHNIQUE: Fluoroscopic spot image(s) were submitted for interpretation post-operatively. COMPARISON:  Preoperative radiographs 08/23/2018 FINDINGS: Intraoperative saved images demonstrate interval open reduction and internal fixation of the periprosthetic distal femoral fracture with a lateral buttress plate and screw construct. No evidence of immediate hardware complication. Additionally, patient has surgical changes consistent with prior total knee arthroplasty. IMPRESSION: ORIF of distal right femoral fracture with lateral buttress plate and screw construct. No immediate hardware complication. Electronically Signed   By: Malachy Moan M.D.   On: 08/24/2018 20:20   ASSESSMENT AND PLAN:   Acute right distal periprosthetic femur fracture- s/p ORIF on 11/13 -Ortho following -Knee immobilizer -NWB to right leg -PT recommending SNF  Acute blood loss anemia with a history of anemia of chronic disease. Hgb 6.7 this morning after surgery yesterday. -2 units pRBCs today -Check anemia panel -Hold lovenox -Recheck H/H tomorrow  Mild hyperkalemia- resolved s/p Veltassa x 2. K 4.7 today. -Continue holding losartan -Recheck in the morning   AKI in CKD 3 with solitary kidney- resolved, Cr back to  baseline -Continue IVFs -Recheck Cr in the morning -Holding losartan -Avoid nephrotoxic agents  Hypertension- normotensive -Holding losartan as above -Continue metoprolol  Type 2 diabetes- sugars well controlled -Continue Novolog 70/30 and sensitive SSI  History of PE -Holding Xarelto for surgery  All the records are reviewed and case discussed with Care Management/Social Worker. Management plans discussed with the patient, family and they are in agreement.  CODE STATUS: Full Code  TOTAL TIME TAKING CARE OF THIS PATIENT: 35 minutes.   More than 50% of the time was spent in counseling/coordination of care: YES  POSSIBLE D/C tomorrow, DEPENDING ON CLINICAL CONDITION.   Jinny Blossom Deantre Bourdon M.D on 08/25/2018 at 12:40 PM  Between 7am to 6pm - Pager - [phone]  After 6pm go to [email or web address] - Social research officer, government  [facility]  [facility]  Office  [phone]  CC: Primary care physician; Marguarite Arbour, MD  Note: This dictation was prepared with Dragon dictation along with smaller phrase technology. Any transcriptional errors that result from this process are unintentional.

## 2018-08-25 NOTE — Progress Notes (Signed)
  Subjective: 1 Day Post-Op Procedure(s) (LRB): OPEN REDUCTION INTERNAL FIXATION (ORIF) SUPRACONDYLAR FEMUR FRACTURE (Right) Patient reports pain as mild.   Patient seen in rounds with Dr. Joice LoftsPoggi. Patient is well, and has had no acute complaints or problems Plan is to go Skilled nursing facility after hospital stay. Negative for chest pain and shortness of breath Fever: no Gastrointestinal: Negative for nausea and vomiting  Objective: Vital signs in last 24 hours: Temp:  [97.3 F (36.3 C)-98.2 F (36.8 C)] 98.2 F (36.8 C) (11/14 0317) Pulse Rate:  [61-79] 63 (11/14 0317) Resp:  [15-24] 20 (11/14 0317) BP: (114-148)/(46-94) 114/48 (11/14 0317) SpO2:  [90 %-100 %] 97 % (11/14 0317) Weight:  [93.4 kg-109.8 kg] 109.8 kg (11/14 0449)  Intake/Output from previous day:  Intake/Output Summary (Last 24 hours) at 08/25/2018 0620 Last data filed at 08/25/2018 0017 Gross per 24 hour  Intake 1714.08 ml  Output 800 ml  Net 914.08 ml    Intake/Output this shift: Total I/O In: 1000 [I.V.:1000] Out: 600 [Urine:600]  Labs: Recent Labs    08/22/18 2123 08/23/18 0500 08/24/18 0420 08/25/18 0449  HGB 9.8* 8.9* 7.6* 6.7*   Recent Labs    08/24/18 0420 08/25/18 0449  WBC 12.1* 9.4  RBC 3.16* 2.83*  HCT 26.4* 23.8*  PLT 189 158   Recent Labs    08/24/18 0420 08/25/18 0449  NA 142 143  K 5.2* 4.7  CL 109 113*  CO2 27 25  BUN 35* 40*  CREATININE 2.04* 1.83*  GLUCOSE 126* 148*  CALCIUM 8.9 8.3*   No results for input(s): LABPT, INR in the last 72 hours.   EXAM General - Patient is Alert and Oriented Extremity - Sensation intact distally Dorsiflexion/Plantar flexion intact Dressing/Incision - clean, dry, no drainage with the hinged range of motion brace locked in extension Motor Function - intact, moving foot and toes well on exam.   Past Medical History:  Diagnosis Date  . Depression   . Diabetes mellitus without complication (HCC)   . Dyspnea   . Elevated  lipids   . GERD (gastroesophageal reflux disease)   . Hypertension   . Pulmonary emboli (HCC)   . Renal insufficiency    only has 1 kidney  . Sleep apnea   . Stroke (HCC)    Rt side weakness    Assessment/Plan: 1 Day Post-Op Procedure(s) (LRB): OPEN REDUCTION INTERNAL FIXATION (ORIF) SUPRACONDYLAR FEMUR FRACTURE (Right) Active Problems:   Femur fracture (HCC)  Estimated body mass index is 35.74 kg/m as calculated from the following:   Height as of this encounter: 5\' 9"  (1.753 m).   Weight as of this encounter: 109.8 kg. Advance diet Up with therapy Discharge to SNF when cleared by medicine. Transfuse 2 units of packed red blood cells as ordered by medicine.  Hemoglobin 6.7.  Recheck labs after transfusion.  DVT Prophylaxis - Lovenox, Foot Pumps and TED hose Non-weight-Bearing  to right leg  Gary Skeensodd Mahaley Schwering, PA-C Orthopaedic Surgery 08/25/2018, 6:20 AM

## 2018-08-25 NOTE — Progress Notes (Signed)
Pts BG 105, MD [facility] notified. Per MD hold scheduled Novolog 70/30.

## 2018-08-25 NOTE — Progress Notes (Signed)
Patient's wife [name] has agreed to bring [facility] as [facility].   B[employer] IncorporatedBailey Tarrell Debes, LCSW [phone]

## 2018-08-25 NOTE — Evaluation (Signed)
Physical Therapy Evaluation Patient Details Name: Gary Thomas MRN: [id number] DOB: 11-21-1931 Today's Date: 08/25/2018   History of Present Illness  84 y.o. male with a known history of diabetes type 2, kidney disease, hypertension, right-sided weakness status post remote stroke.  He also has h/o lumbar sx 5/19 with reported L side weakness.  He suffered fall at home was found to have R distal femur fx (involving previous total knee.  He is status post ORIF and is NWBing with locked KI donned.  Clinical Impression  Pt showed good effort with PT exam but was pain limited with most activities (included gentle, NWBing exercises with R LE) and even with +2 assist struggled to get up to standing with L LE buckling and despite good UE strength inability to use walker to maintain standing and NWBing on the R.  Pt not at all safe to try to go home and will clearly need rehab in order to work back to more functional level.     Follow Up Recommendations SNF    Equipment Recommendations  (TBD after rehab)    Recommendations for Other Services       Precautions / Restrictions Precautions Precautions: Fall Restrictions RLE Weight Bearing: Non weight bearing      Mobility  Bed Mobility Overal bed mobility: Needs Assistance Bed Mobility: Supine to Sit;Sit to Supine     Supine to sit: Max assist Sit to supine: Max assist   General bed mobility comments: Pt showed some willingness to try getting up/down, but is too weak and limited to do much for himself  Transfers Overall transfer level: Needs assistance Equipment used: Rolling walker (2 wheeled) Transfers: Sit to/from Stand Sit to Stand: +2 physical assistance;Total assist         General transfer comment: Pt unable to attain/maintain full standing.  Buckling significantly in L LE despite heavy +2 assist, struggled to maintain NWBing on R  Ambulation/Gait                Stairs            Wheelchair Mobility     Modified Rankin (Stroke Patients Only)       Balance Overall balance assessment: Needs assistance Sitting-balance support: Bilateral upper extremity supported Sitting balance-Leahy Scale: Fair Sitting balance - Comments: once assisted to position he was able to maintain sitting with effort     Standing balance-Leahy Scale: Zero Standing balance comment: unable to safely stand even with heavy +2 assist                             Pertinent Vitals/Pain Pain Assessment: 0-10 Pain Score: 8  Pain Location: c/o severe pain with any movement in R proximal knee    Home Living Family/patient expects to be discharged to:: Skilled nursing facility Living Arrangements: Spouse/significant other                    Prior Function Level of Independence: Independent with assistive device(s);Needs assistance   Gait / Transfers Assistance Needed: reports multiple falls; uses cane or RW  ADL's / Homemaking Assistance Needed: `        Hand Dominance   Dominant Hand: Right    Extremity/Trunk Assessment   Upper Extremity Assessment Upper Extremity Assessment: Overall WFL for tasks assessed    Lower Extremity Assessment Lower Extremity Assessment: Generalized weakness(R pain limited with little/no AROM t/o, L grossly 3-/5)  Communication   Communication: HOH  Cognition Arousal/Alertness: Awake/alert Behavior During Therapy: WFL for tasks assessed/performed Overall Cognitive Status: Within Functional Limits for tasks assessed                                        General Comments      Exercises General Exercises - Lower Extremity Ankle Circles/Pumps: AAROM;10 reps Quad Sets: Strengthening;10 reps Hip ABduction/ADduction: Strengthening;10 reps Straight Leg Raises: AAROM;5 reps   Assessment/Plan    PT Assessment Patient needs continued PT services  PT Problem List Decreased strength;Decreased range of motion;Decreased activity  tolerance;Decreased balance;Decreased mobility;Decreased coordination;Decreased knowledge of use of DME;Decreased safety awareness;Pain;Decreased knowledge of precautions       PT Treatment Interventions DME instruction;Stair training;Gait training;Therapeutic exercise;Therapeutic activities;Functional mobility training;Balance training;Neuromuscular re-education;Patient/family education    PT Goals (Current goals can be found in the Care Plan section)  Acute Rehab PT Goals PT Goal Formulation: With patient Time For Goal Achievement: 09/08/18 Potential to Achieve Goals: Fair    Frequency BID   Barriers to discharge        Co-evaluation               AM-PAC PT "6 Clicks" Daily Activity  Outcome Measure Difficulty turning over in bed (including adjusting bedclothes, sheets and blankets)?: Unable Difficulty moving from lying on back to sitting on the side of the bed? : Unable Difficulty sitting down on and standing up from a chair with arms (e.g., wheelchair, bedside commode, etc,.)?: Unable Help needed moving to and from a bed to chair (including a wheelchair)?: Total Help needed walking in hospital room?: Total Help needed climbing 3-5 steps with a railing? : Total 6 Click Score: 6    End of Session Equipment Utilized During Treatment: Gait belt;Right knee immobilizer Activity Tolerance: Patient limited by pain Patient left: with bed alarm set;with call bell/phone within reach;with family/visitor present Nurse Communication: Mobility status PT Visit Diagnosis: Difficulty in walking, not elsewhere classified (R26.2);Muscle weakness (generalized) (M62.81)    Time: [id number] PT Time Calculation (min) (ACUTE ONLY): 44 min   Charges:   PT Evaluation $PT Eval Low Complexity: 1 Low PT Treatments $Therapeutic Exercise: 8-22 mins        Malachi ProGalen R Kelvyn Schunk, DPT 08/25/2018, 1:51 PM

## 2018-08-26 ENCOUNTER — Other Ambulatory Visit: Payer: Self-pay | Admitting: Adult Health

## 2018-08-26 LAB — BASIC METABOLIC PANEL
Anion gap: 10 (ref 5–15)
BUN: 38 mg/dL — AB (ref 8–23)
CALCIUM: 8.5 mg/dL — AB (ref 8.9–10.3)
CO2: 24 mmol/L (ref 22–32)
CREATININE: 1.87 mg/dL — AB (ref 0.61–1.24)
Chloride: 106 mmol/L (ref 98–111)
GFR calc Af Amer: 36 mL/min — ABNORMAL LOW (ref >60)
GFR, EST NON AFRICAN AMERICAN: 31 mL/min — AB (ref >60)
Glucose, Bld: 136 mg/dL — ABNORMAL HIGH (ref 70–99)
Potassium: 3.6 mmol/L (ref 3.5–5.1)
SODIUM: 140 mmol/L (ref 135–145)

## 2018-08-26 LAB — CBC
HCT: 28.1 % — ABNORMAL LOW (ref 39.0–52.0)
Hemoglobin: 8.2 g/dL — ABNORMAL LOW (ref 13.0–17.0)
MCH: 24.6 pg — ABNORMAL LOW (ref 26.0–34.0)
MCHC: 29.2 g/dL — AB (ref 30.0–36.0)
MCV: 84.4 fL (ref 80.0–100.0)
Platelets: 173 10*3/uL (ref 150–400)
RBC: 3.33 MIL/uL — ABNORMAL LOW (ref 4.22–5.81)
RDW: 15.8 % — ABNORMAL HIGH (ref 11.5–15.5)
WBC: 11.7 10*3/uL — ABNORMAL HIGH (ref 4.0–10.5)
nRBC: 0.2 % (ref 0.0–0.2)

## 2018-08-26 LAB — TYPE AND SCREEN
ABO/RH(D): O POS
Antibody Screen: NEGATIVE
Unit division: 0

## 2018-08-26 LAB — GLUCOSE, CAPILLARY
Glucose-Capillary: 151 mg/dL — ABNORMAL HIGH (ref 70–99)
Glucose-Capillary: 112 mg/dL — ABNORMAL HIGH (ref 70–99)
Glucose-Capillary: 138 mg/dL — ABNORMAL HIGH (ref 70–99)

## 2018-08-26 LAB — BPAM RBC
Blood Product Expiration Date: 201912062359
ISSUE DATE / TIME: 201911141056
Unit Type and Rh: 5100

## 2018-08-26 MED ORDER — TRAMADOL HCL 50 MG PO TABS: 50.0000 mg | ORAL_TABLET | Freq: Four times a day (QID) | ORAL | 0 refills | Status: DC | PRN

## 2018-08-26 MED ORDER — RIVAROXABAN 15 MG PO TABS
15.0000 mg | ORAL_TABLET | Freq: Every day | ORAL | Status: DC
  Administered 2018-08-26: 15 mg via ORAL
  Filled 2018-08-26: qty 1

## 2018-08-26 MED ORDER — FERROUS SULFATE 325 (65 FE) MG PO TBEC: 325.0000 mg | DELAYED_RELEASE_TABLET | Freq: Three times a day (TID) | ORAL | 0 refills | Status: DC

## 2018-08-26 MED ORDER — OXYCODONE HCL 5 MG PO TABS: 5.0000 mg | ORAL_TABLET | ORAL | 0 refills | Status: DC | PRN

## 2018-08-26 MED ORDER — SODIUM CHLORIDE 0.9 % IV SOLN
200.0000 mg | Freq: Once | INTRAVENOUS | Status: AC
  Administered 2018-08-26: 200 mg via INTRAVENOUS
  Filled 2018-08-26: qty 10

## 2018-08-26 NOTE — Discharge Instructions (Signed)
It was so nice to meet you during this hospitalization!   You had a fracture of your femur and had surgery done. After surgery, your hemoglobin was low (probably due to losing blood during the surgery), so you received a blood transfusion. Your blood levels were stable after the blood transfusion. I checked your iron level, which was low, so I sent you home with iron tablets that you should take three times a day with meals.  Take care, Dr. Nancy Marus  INSTRUCTIONS AFTER Surgery  o Remove items at home which could result in a fall. This includes throw rugs or furniture in walking pathways o ICE to the affected joint every three hours while awake for 30 minutes at a time, for at least the first 3-5 days, and then as needed for pain and swelling.  Continue to use ice for pain and swelling. You may notice swelling that will progress down to the foot and ankle.  This is normal after surgery.  Elevate your leg when you are not up walking on it.   o Continue to use the breathing machine you got in the hospital (incentive spirometer) which will help keep your temperature down.  It is common for your temperature to cycle up and down following surgery, especially at night when you are not up moving around and exerting yourself.  The breathing machine keeps your lungs expanded and your temperature down.   DIET:  As you were doing prior to hospitalization, we recommend a well-balanced diet.  DRESSING / WOUND CARE / SHOWERING  Dressing is waterproof.  No showering.  Change as needed.  Staples will be removed at [facility] clinic in 2 weeks.  Hinged range of motion brace will remain on for all activities.  ACTIVITY  o Increase activity slowly as tolerated, but follow the weight bearing instructions below.   o No driving for 6 weeks or until further direction given by your physician.  You cannot drive while taking narcotics.  o No lifting or carrying greater than 10 lbs. until further directed by your  surgeon. o Avoid periods of inactivity such as sitting longer than an hour when not asleep. This helps prevent blood clots.  o You may return to work once you are authorized by your doctor.     WEIGHT BEARING  Nonweightbearing on the right side   EXERCISES Gait training with nonweightbearing on the right using a walker.  CONSTIPATION  Constipation is defined medically as fewer than three stools per week and severe constipation as less than one stool per week.  Even if you have a regular bowel pattern at home, your normal regimen is likely to be disrupted due to multiple reasons following surgery.  Combination of anesthesia, postoperative narcotics, change in appetite and fluid intake all can affect your bowels.   YOU MUST use at least one of the following options; they are listed in order of increasing strength to get the job done.  They are all available over the counter, and you may need to use some, POSSIBLY even all of these options:    Drink plenty of fluids (prune juice may be helpful) and high fiber foods Colace 100 mg by mouth twice a day  Senokot for constipation as directed and as needed Dulcolax (bisacodyl), take with full glass of water  Miralax (polyethylene glycol) once or twice a day as needed.  If you have tried all these things and are unable to have a bowel movement in the first 3-4 days after  surgery call either your surgeon or your primary doctor.    If you experience loose stools or diarrhea, hold the medications until you stool forms back up.  If your symptoms do not get better within 1 week or if they get worse, check with your doctor.  If you experience "the worst abdominal pain ever" or develop nausea or vomiting, please contact the office immediately for further recommendations for treatment.   ITCHING:  If you experience itching with your medications, try taking only a single pain pill, or even half a pain pill at a time.  You can also use Benadryl over the  counter for itching or also to help with sleep.   TED HOSE STOCKINGS:  Use stockings on both legs until for at least 2 weeks or as directed by physician office. They may be removed at night for sleeping.  MEDICATIONS:  See your medication summary on the After Visit Summary that nursing will review with you.  You may have some home medications which will be placed on hold until you complete the course of blood thinner medication.  It is important for you to complete the blood thinner medication as prescribed.  PRECAUTIONS:  If you experience chest pain or shortness of breath - call 911 immediately for transfer to the hospital emergency department.   If you develop a fever greater that 101 F, purulent drainage from wound, increased redness or drainage from wound, foul odor from the wound/dressing, or calf pain - CONTACT YOUR SURGEON.                                                   FOLLOW-UP APPOINTMENTS:  If you do not already have a post-op appointment, please call the office for an appointment to be seen by your surgeon.  Guidelines for how soon to be seen are listed in your After Visit Summary, but are typically between 1-4 weeks after surgery.  OTHER INSTRUCTIONS:     MAKE SURE YOU:   Understand these instructions.   Get help right away if you are not doing well or get worse.    Thank you for letting us be a part of your medical care team.  It is a privilege we respect greatly.  We hope these instructions will help you stay on track for a fast and full recovery!

## 2018-08-26 NOTE — Progress Notes (Signed)
Patient is medically stable for [facility]. [facility] come today to room 209-A. RN will call report at [phone] and arrange EMS for transport. Clinical Child psychotherapistocial Worker (CSW) sent [facility] orders [facility] via [employer]. Patient is aware of above. Patient's wife [name] is at bedside and aware of above. Per patient's wife she has patient's [facility]rost[facility] supplies to [facility]. Please reconsult if future social work needs arise. CSW signing off.   Baker Hughes IncorporatedBailey Foch Rosenwald, LCSW [phone]

## 2018-08-26 NOTE — Clinical Social Work Placement (Signed)
   CLINICAL SOCIAL WORK PLACEMENT  NOTE  Date:  08/26/2018  Patient Details  Name: Gary Thomas MRN: [id number] Date of Birth: [birth year]  Clinical Social Work is seeking post-discharge placement for this patient at the Skilled  Nursing Facility level of care (*CSW will initial, date and re-position this form in  chart as items are completed):  Yes   Patient/family provided with [place] Clinical Social Work Department's list of facilities offering this level of care within the geographic area requested by the patient (or if unable, by the patient's family).  Yes   Patient/family informed of their freedom to choose among providers that offer the needed level of care, that participate in Medicare, Medicaid or managed care program needed by the patient, have an available bed and are willing to accept the patient.  Yes   Patient/family informed of [facility]'s ownership interest in [facility] and [facility], as well as of the fact that they are under no obligation to receive care at these facilities.  PASRR submitted to EDS on       PASRR number received on       Existing PASRR number confirmed on 08/23/18     FL2 transmitted to all facilities in geographic area requested by pt/family on 08/23/18     FL2 transmitted to all facilities within larger geographic area on       Patient informed that his/her managed care company has contracts with or will negotiate with certain facilities, including the following:        Yes   Patient/family informed of bed offers received.  Patient chooses bed at [facility]     Physician recommends and patient chooses bed at      Patient to be transferred to [facility] on 08/26/18.  Patient to be transferred to facility by [facility]     Patient family notified on 08/26/18 of transfer.  Name of family member notified:  (Patient's wife [name] is aware of D/C today. )     PHYSICIAN       Additional  Comment:    _______________________________________________ Abubakr Wieman, Darleen CrockerBailey M, LCSW 08/26/2018, 2:14 PM

## 2018-08-26 NOTE — Progress Notes (Signed)
Physical Therapy Treatment Patient Details Name: Gary Thomas MRN: [id number] DOB: October 27, 1931 Today's Date: 08/26/2018    History of Present Illness 84 y.o. male with a known history of diabetes type 2, kidney disease, hypertension, right-sided weakness status post remote stroke.  He also has h/o lumbar sx 5/19 with reported L side weakness.  He suffered fall at home was found to have R distal femur fx (involving previous total knee.  He is status post ORIF and is NWBing with locked KI donned.    PT Comments    Pt continues to struggle significantly with bed mobility and even more so with attempts at standing.  Despite heavy cuing, raised bed height, +2 max assist and answering questions he showed no functional strength in L LE to push up and no ability to actually shift weight forward effectively enough for PT and tech to even raise him more than an inch or two.  Pt realizing that he is very functionally limited at this time and knows he has a lot of work ahead of him.   Follow Up Recommendations  SNF     Equipment Recommendations       Recommendations for Other Services       Precautions / Restrictions Precautions Precautions: Fall Restrictions Weight Bearing Restrictions: Yes RLE Weight Bearing: Weight bearing as tolerated    Mobility  Bed Mobility Overal bed mobility: Needs Assistance Bed Mobility: Supine to Sit;Sit to Supine     Supine to sit: Max assist Sit to supine: Max assist   General bed mobility comments: Pt unable to really assist at all during transitions  Transfers Overall transfer level: Needs assistance Equipment used: Rolling walker (2 wheeled) Transfers: Sit to/from Stand Sit to Stand: +2 physical assistance;Total assist         General transfer comment: Pt again unable to effectively use L LE to push up or follow instructions to shift weight forward.  c/o R knee pain (even despite NWBing) and generally showed very poor prospects today for  being able to stand even with heavy +2 assist.  Ambulation/Gait                 Stairs             Wheelchair Mobility    Modified Rankin (Stroke Patients Only)       Balance                                            Cognition Arousal/Alertness: Awake/alert Behavior During Therapy: WFL for tasks assessed/performed Overall Cognitive Status: Within Functional Limits for tasks assessed                                        Exercises General Exercises - Lower Extremity Ankle Circles/Pumps: 10 reps;Strengthening Quad Sets: Strengthening;10 reps Gluteal Sets: Strengthening;10 reps Short Arc Quad: AAROM;10 reps;Left Heel Slides: Left;AROM;10 reps Hip ABduction/ADduction: Strengthening;AROM;10 reps Straight Leg Raises: AAROM;5 reps    General Comments        Pertinent Vitals/Pain Pain Score: 9  Pain Location: hypersensitive to any movement of the R LE    Home Living                      Prior Function  PT Goals (current goals can now be found in the care plan section) Progress towards PT goals: Progressing toward goals    Frequency    BID      PT Plan Current plan remains appropriate    Co-evaluation              AM-PAC PT "6 Clicks" Daily Activity  Outcome Measure  Difficulty turning over in bed (including adjusting bedclothes, sheets and blankets)?: Unable Difficulty moving from lying on back to sitting on the side of the bed? : Unable Difficulty sitting down on and standing up from a chair with arms (e.g., wheelchair, bedside commode, etc,.)?: Unable Help needed moving to and from a bed to chair (including a wheelchair)?: Total Help needed walking in hospital room?: Total Help needed climbing 3-5 steps with a railing? : Total 6 Click Score: 6    End of Session Equipment Utilized During Treatment: Right knee immobilizer Activity Tolerance: Patient limited by pain Patient  left: with bed alarm set;with call bell/phone within reach;with family/visitor present Nurse Communication: Mobility status PT Visit Diagnosis: Difficulty in walking, not elsewhere classified (R26.2);Muscle weakness (generalized) (M62.81)     Time: 1610-9604 PT Time Calculation (min) (ACUTE ONLY): 44 min  Charges:  $Therapeutic Exercise: 23-37 mins $Therapeutic Activity: 8-22 mins                     Malachi Pro, DPT 08/26/2018, 1:26 PM

## 2018-08-26 NOTE — Progress Notes (Signed)
EMS arrived to [facility]. VSS, PIV removed. All belongings packed up.  Suzan SlickAlison L Paola Aleshire, RN

## 2018-08-26 NOTE — Progress Notes (Signed)
  Subjective: 2 Days Post-Op Procedure(s) (LRB): OPEN REDUCTION INTERNAL FIXATION (ORIF) SUPRACONDYLAR FEMUR FRACTURE (Right) Patient reports pain as mild.   Patient seen in rounds with Dr. Joice LoftsPoggi. Patient is well, and has had no acute complaints or problems Plan is to go Skilled nursing facility after hospital stay. Negative for chest pain and shortness of breath Fever: no Gastrointestinal: Negative for nausea and vomiting  Objective: Vital signs in last 24 hours: Temp:  [97.7 F (36.5 C)-98.2 F (36.8 C)] 98.1 F (36.7 C) (11/14 2341) Pulse Rate:  [60-74] 74 (11/14 2341) Resp:  [16-19] 19 (11/14 2341) BP: (100-117)/(43-97) 115/45 (11/14 2341) SpO2:  [90 %-99 %] 90 % (11/14 2341)  Intake/Output from previous day:  Intake/Output Summary (Last 24 hours) at 08/26/2018 0634 Last data filed at 08/26/2018 0522 Gross per 24 hour  Intake 2238.51 ml  Output 2250 ml  Net -11.49 ml    Intake/Output this shift: Total I/O In: -  Out: 1050 [Urine:1050]  Labs: Recent Labs    08/24/18 0420 08/25/18 0449 08/25/18 1923 08/26/18 0525  HGB 7.6* 6.7* 7.8* 8.2*   Recent Labs    08/25/18 0449 08/25/18 1923 08/26/18 0525  WBC 9.4  --  11.7*  RBC 2.83*  --  3.33*  HCT 23.8* 26.1* 28.1*  PLT 158  --  173   Recent Labs    08/25/18 0449 08/26/18 0525  NA 143 140  K 4.7 3.6  CL 113* 106  CO2 25 24  BUN 40* 38*  CREATININE 1.83* 1.87*  GLUCOSE 148* 136*  CALCIUM 8.3* 8.5*   No results for input(s): LABPT, INR in the last 72 hours.   EXAM General - Patient is Alert and Oriented Extremity - Sensation intact distally Dorsiflexion/Plantar flexion intact Dressing/Incision - clean, dry, no drainage with the hinged range of motion brace locked in extension.  The surgical bandage was changed to a new honeycomb dressing. Motor Function - intact, moving foot and toes well on exam.   Past Medical History:  Diagnosis Date  . Depression   . Diabetes mellitus without complication  (HCC)   . Dyspnea   . Elevated lipids   . GERD (gastroesophageal reflux disease)   . Hypertension   . Pulmonary emboli (HCC)   . Renal insufficiency    only has 1 kidney  . Sleep apnea   . Stroke (HCC)    Rt side weakness    Assessment/Plan: 2 Days Post-Op Procedure(s) (LRB): OPEN REDUCTION INTERNAL FIXATION (ORIF) SUPRACONDYLAR FEMUR FRACTURE (Right) Active Problems:   Femur fracture (HCC)  Estimated body mass index is 35.74 kg/m as calculated from the following:   Height as of this encounter: 5\' 9"  (1.753 m).   Weight as of this encounter: 109.8 kg. Advance diet Up with therapy Discharge to SNF today. Hemoglobin 8.2.  Vitals stable.  DVT Prophylaxis - Lovenox, Foot Pumps and TED hose Non-weight-Bearing  to right leg  Dedra Skeensodd Tiyon Sanor, PA-C Orthopaedic Surgery 08/26/2018, 6:34 AM

## 2018-08-26 NOTE — Progress Notes (Signed)
[facility].   Suzan SlickAlison L Kesean Serviss, RN

## 2018-08-26 NOTE — Consult Note (Signed)
ANTICOAGULATION CONSULT NOTE - Initial Consult  Pharmacy Consult for Xarelto Indication: non-valvular atrial fibrillation  Allergies  Allergen Reactions  . Codeine Anaphylaxis  . Morphine Anaphylaxis  . Gabapentin Other (See Comments)    Unsure of what the reaction was   . Stadol [Butorphanol]     Unknown  . Lyrica [Pregabalin] Rash  . Tapentadol Nausea Only    ONSET 01/04/2013    Patient Measurements: Height: 5\' 9"  (175.3 cm) Weight: 242 lb (109.8 kg) IBW/kg (Calculated) : 70.7   Vital Signs: Temp: 97.9 F (36.6 C) (11/15 0907) Temp Source: Axillary (11/15 0907) BP: 124/47 (11/15 0907) Pulse Rate: 66 (11/15 0907)  Labs: Recent Labs    08/24/18 0420 08/25/18 0449 08/25/18 1923 08/26/18 0525  HGB 7.6* 6.7* 7.8* 8.2*  HCT 26.4* 23.8* 26.1* 28.1*  PLT 189 158  --  173  CREATININE 2.04* 1.83*  --  1.87*    Estimated Creatinine Clearance: 35.9 mL/min (A) (by C-G formula based on SCr of 1.87 mg/dL (H)).   Medical History: Past Medical History:  Diagnosis Date  . Depression   . Diabetes mellitus without complication (HCC)   . Dyspnea   . Elevated lipids   . GERD (gastroesophageal reflux disease)   . Hypertension   . Pulmonary emboli (HCC)   . Renal insufficiency    only has 1 kidney  . Sleep apnea   . Stroke (HCC)    Rt side weakness    Medications:  Patient was on Xarelto prior to admission for non-valvular Afib and took last 15mg  dose on 11/12 prior to surgical intervention.  Assessment: Patient's renal function still warrants reduced daily dose of Xarelto 15mg  daily   Plan:  Xarelto 15mg  qd  Albina Billetharles M Chyler Creely, PharmD Clinical Pharmacist 08/26/2018 10:44 AM

## 2018-08-26 NOTE — Discharge Summary (Signed)
[facility] [place] at [facility]   PATIENT NAME: Gary Thomas    MR#:  [id number]  DATE OF BIRTH:  02-17-1932  DATE OF ADMISSION:  08/22/2018   ADMITTING PHYSICIAN: Cammy Copa, MD  DATE OF DISCHARGE: 08/26/18  PRIMARY CARE PHYSICIAN: Marguarite Arbour, MD   ADMISSION DIAGNOSIS:  Fall in home, initial encounter [W19.XXXA, Y92.009] Other closed fracture of distal end of right femur, initial encounter (HCC) [S72.491A] DISCHARGE DIAGNOSIS:  Active Problems:   Femur fracture (HCC)  SECONDARY DIAGNOSIS:   Past Medical History:  Diagnosis Date  . Depression   . Diabetes mellitus without complication (HCC)   . Dyspnea   . Elevated lipids   . GERD (gastroesophageal reflux disease)   . Hypertension   . Pulmonary emboli (HCC)   . Renal insufficiency    only has 1 kidney  . Sleep apnea   . Stroke (HCC)    Rt side weakness   HOSPITAL COURSE:   [name] is an 84 year old male who presented to the ED with right leg pain after mechanical fall at home. In the ED, he was found to have an acute right distal femur fracture. He was admitted for further management.  Acute right distal periprosthetic femur fracture- s/p ORIF on 11/13 -Knee immobilizer -NWB to right leg -PT recommending SNF -Pain control  Acute blood loss anemia with a history of anemia of chronic disease- stable -Hgb 6.7 after surgery, so patient received blood transfusion -Hgb stable on the day of discharge -Anemia panel significant for ferritin of 20, received IV iron x 1 and discharged on ferrous sulfate tid -Home xarelto restarted on the day of discharge -Needs CBC rechecked as an outpatient  AKI in CKD 3 with solitary kidney-felt to be secondary to dehydration in the post-op period. Cr returned to baseline. -Resolved with IVFs -Losartan held for a couple of days, but restarted on discharge -Needs BMP rechecked as an outpatient  Mild hyperkalemia- likely related to  AKI. -Resolved s/p Veltassa x 2 -Losartan held for a couple of days, but restarted on discharge -Needs BMP rechecked as an outpatient  History of PE -Held Xarelto for surgery and restarted this on discharge  DISCHARGE CONDITIONS:  Acute right distal periprosthetic femur fracture- s/p ORIF Acute blood loss anemia in chronic iron deficiency anemia CKD III History of PE on chronic anticoaguation CONSULTS OBTAINED:  Treatment Team:  Christena Flake, MD DRUG ALLERGIES:   Allergies  Allergen Reactions  . Codeine Anaphylaxis  . Morphine Anaphylaxis  . Gabapentin Other (See Comments)    Unsure of what the reaction was   . Stadol [Butorphanol]     Unknown  . Lyrica [Pregabalin] Rash  . Tapentadol Nausea Only    ONSET 01/04/2013   DISCHARGE MEDICATIONS:   Allergies as of 08/26/2018      Reactions   Codeine Anaphylaxis   Morphine Anaphylaxis   Gabapentin Other (See Comments)   Unsure of what the reaction was   Stadol [butorphanol]    Unknown   Lyrica [pregabalin] Rash   Tapentadol Nausea Only   ONSET 01/04/2013      Medication List    STOP taking these medications   diclofenac sodium 1 % Gel Commonly known as:  VOLTAREN   HYDROcodone-acetaminophen 5-325 MG tablet Commonly known as:  NORCO/VICODIN   methocarbamol 500 MG tablet Commonly known as:  ROBAXIN     TAKE these medications   atorvastatin 20 MG tablet Commonly known as:  LIPITOR Take  20 mg by mouth at bedtime.   beta carotene w/minerals tablet Take 1 tablet by mouth at bedtime.   ferrous sulfate 325 (65 FE) MG EC tablet Take 1 tablet (325 mg total) by mouth 3 (three) times daily with meals.   furosemide 40 MG tablet Commonly known as:  LASIX Take 40 mg by mouth at bedtime.   losartan 25 MG tablet Commonly known as:  COZAAR Take 25 mg by mouth at bedtime.   metoprolol succinate 100 MG 24 hr tablet Commonly known as:  TOPROL-XL Take 100 mg by mouth at bedtime.   NOVOLOG MIX 70/30 FLEXPEN (70-30)  100 UNIT/ML FlexPen Generic drug:  insulin aspart protamine - aspart Inject 25 Units into the skin 2 (two) times daily.   omeprazole 40 MG capsule Commonly known as:  PRILOSEC Take by mouth daily.   oxyCODONE 5 MG immediate release tablet Commonly known as:  Oxy IR/ROXICODONE Take 1-2 tablets (5-10 mg total) by mouth every 4 (four) hours as needed for moderate pain (pain score 4-6).   sertraline 100 MG tablet Commonly known as:  ZOLOFT Take 100 mg by mouth at bedtime.   tiotropium 18 MCG inhalation capsule Commonly known as:  SPIRIVA Place 1 capsule (18 mcg total) into inhaler and inhale daily.   traMADol 50 MG tablet Commonly known as:  ULTRAM Take 1 tablet (50 mg total) by mouth every 6 (six) hours as needed for moderate pain.   XARELTO 15 MG Tabs tablet Generic drug:  Rivaroxaban Take 15 mg by mouth at bedtime.        DISCHARGE INSTRUCTIONS:  1. F/u with PCP in 1-2 weeks 2. F/u with ortho in 2 weeks 3. Recheck CBC and BMP as outpatient DIET:  Cardiac diet DISCHARGE CONDITION:  Stable ACTIVITY:  NWB to right leg OXYGEN:  Home Oxygen: No.  Oxygen Delivery: room air DISCHARGE LOCATION:  nursing home   If you experience worsening of your admission symptoms, develop shortness of breath, life threatening emergency, suicidal or homicidal thoughts you must seek medical attention immediately by calling 911 or calling your MD immediately  if symptoms less severe.  You Must read complete instructions/literature along with all the possible adverse reactions/side effects for all the Medicines you take and that have been prescribed to you. Take any new Medicines after you have completely understood and accpet all the possible adverse reactions/side effects.   Please note  You were cared for by a hospitalist during your hospital stay. If you have any questions about your discharge medications or the care you received while you were in the hospital after you are discharged,  you can call the unit and asked to speak with the hospitalist on call if the hospitalist that took care of you is not available. Once you are discharged, your primary care physician will handle any further medical issues. Please note that NO REFILLS for any discharge medications will be authorized once you are discharged, as it is imperative that you return to your primary care physician (or establish a relationship with a primary care physician if you do not have one) for your aftercare needs so that they can reassess your need for medications and monitor your lab values.    On the day of Discharge:  VITAL SIGNS:  Blood pressure (!) 124/47, pulse 66, temperature 97.9 F (36.6 C), temperature source Axillary, resp. rate 20, height 5\' 9"  (1.753 m), weight 109.8 kg, SpO2 90 %. PHYSICAL EXAMINATION:  GENERAL:  84 y.o.-year-old patient lying in  the bed with no acute distress.  EYES: Pupils equal, round, reactive to light and accommodation. No scleral icterus. Extraocular muscles intact.  HEENT: Head atraumatic, normocephalic. Oropharynx and nasopharynx clear.  NECK:  Supple, no jugular venous distention. No thyroid enlargement, no tenderness.  LUNGS: Normal breath sounds bilaterally, no wheezing, rales,rhonchi or crepitation. No use of accessory muscles of respiration.  CARDIOVASCULAR: RRR, S1, S2 normal. No murmurs, rubs, or gallops.  ABDOMEN: Soft, non-tender, non-distended. Bowel sounds present. No organomegaly or mass.  EXTREMITIES: No pedal edema, cyanosis, or clubbing. +Hinged knee brace in place over right knee. NEUROLOGIC: Cranial nerves II through XII are intact. Muscle strength 5/5 in upper extremities. Sensation intact. PSYCHIATRIC: The patient is alert and oriented x 3.  SKIN: No obvious rash, lesion, or ulcer.  DATA REVIEW:   CBC Recent Labs  Lab 08/26/18 0525  WBC 11.7*  HGB 8.2*  HCT 28.1*  PLT 173    Chemistries  Recent Labs  Lab 08/22/18 2123  08/26/18 0525  NA 137    < > 140  K 5.0   < > 3.6  CL 105   < > 106  CO2 26   < > 24  GLUCOSE 163*   < > 136*  BUN 30*   < > 38*  CREATININE 1.59*   < > 1.87*  CALCIUM 8.9   < > 8.5*  AST 26  --   --   ALT 15  --   --   ALKPHOS 81  --   --   BILITOT 0.6  --   --    < > = values in this interval not displayed.     Microbiology Results  Results for orders placed or performed during the hospital encounter of 08/22/18  MRSA PCR Screening     Status: None   Collection Time: 08/23/18  2:13 AM  Result Value Ref Range Status   MRSA by PCR NEGATIVE NEGATIVE Final    Comment:        The GeneXpert MRSA Assay (FDA approved for NASAL specimens only), is one component of a comprehensive MRSA colonization surveillance program. It is not intended to diagnose MRSA infection nor to guide or monitor treatment for MRSA infections. Performed at [facility], [address] [place], [place] [address]     RADIOLOGY:  No results found.   Management plans discussed with the patient, family and they are in agreement.  CODE STATUS: Full Code   TOTAL TIME TAKING CARE OF THIS PATIENT: 35 minutes.    Jinny BlossomKaty D Lantz Hermann M.D on 08/26/2018 at 10:00 AM  Between 7am to 6pm - Pager - [phone]  After 6pm go to [email or web address] - Social research officer, governmentpassword [email or web address] [email or web address]  [facility]  Office  [phone]  CC: Primary care physician; Marguarite ArbourSparks, [name], MD   Note: This dictation was prepared with Dragon dictation along with smaller phrase technology. Any transcriptional errors that result from this process are unintentional.

## 2018-08-27 LAB — BLOOD GAS, ARTERIAL
ACID-BASE DEFICIT: 3.5 mmol/L — AB (ref 0.0–2.0)
Bicarbonate: 23.1 mmol/L (ref 20.0–28.0)
FIO2: 0.28
O2 SAT: 90.4 %
Patient temperature: 37
pCO2 arterial: 47 mmHg (ref 32.0–48.0)
pH, Arterial: 7.3 — ABNORMAL LOW (ref 7.350–7.450)
pO2, Arterial: 66 mmHg — ABNORMAL LOW (ref 83.0–108.0)

## 2018-08-28 ENCOUNTER — Other Ambulatory Visit: Payer: Self-pay

## 2018-08-28 ENCOUNTER — Emergency Department: Payer: Medicare Other

## 2018-08-28 ENCOUNTER — Inpatient Hospital Stay
Admission: EM | Admit: 2018-08-28 | Discharge: 2018-08-30 | DRG: 190 | Disposition: A | Discharge status: Skilled Nursing Facility | Payer: Medicare Other | Attending: Internal Medicine | Admitting: Internal Medicine

## 2018-08-28 DIAGNOSIS — N183 Chronic kidney disease, stage 3 (moderate): Secondary | ICD-10-CM | POA: Diagnosis present

## 2018-08-28 DIAGNOSIS — Z885 Allergy status to narcotic agent status: Secondary | ICD-10-CM

## 2018-08-28 DIAGNOSIS — E785 Hyperlipidemia, unspecified: Secondary | ICD-10-CM | POA: Diagnosis present

## 2018-08-28 DIAGNOSIS — F329 Major depressive disorder, single episode, unspecified: Secondary | ICD-10-CM | POA: Diagnosis present

## 2018-08-28 DIAGNOSIS — Z79899 Other long term (current) drug therapy: Secondary | ICD-10-CM | POA: Diagnosis not present

## 2018-08-28 DIAGNOSIS — Z96641 Presence of right artificial hip joint: Secondary | ICD-10-CM | POA: Diagnosis present

## 2018-08-28 DIAGNOSIS — G4730 Sleep apnea, unspecified: Secondary | ICD-10-CM | POA: Diagnosis present

## 2018-08-28 DIAGNOSIS — J9601 Acute respiratory failure with hypoxia: Secondary | ICD-10-CM | POA: Diagnosis present

## 2018-08-28 DIAGNOSIS — J209 Acute bronchitis, unspecified: Secondary | ICD-10-CM | POA: Diagnosis present

## 2018-08-28 DIAGNOSIS — Z86711 Personal history of pulmonary embolism: Secondary | ICD-10-CM | POA: Diagnosis not present

## 2018-08-28 DIAGNOSIS — R0902 Hypoxemia: Secondary | ICD-10-CM

## 2018-08-28 DIAGNOSIS — J441 Chronic obstructive pulmonary disease with (acute) exacerbation: Principal | ICD-10-CM | POA: Diagnosis present

## 2018-08-28 DIAGNOSIS — Z888 Allergy status to other drugs, medicaments and biological substances status: Secondary | ICD-10-CM

## 2018-08-28 DIAGNOSIS — J440 Chronic obstructive pulmonary disease with acute lower respiratory infection: Secondary | ICD-10-CM | POA: Diagnosis present

## 2018-08-28 DIAGNOSIS — M109 Gout, unspecified: Secondary | ICD-10-CM | POA: Diagnosis present

## 2018-08-28 DIAGNOSIS — Z9119 Patient's noncompliance with other medical treatment and regimen: Secondary | ICD-10-CM | POA: Diagnosis not present

## 2018-08-28 DIAGNOSIS — M9701XD Periprosthetic fracture around internal prosthetic right hip joint, subsequent encounter: Secondary | ICD-10-CM | POA: Diagnosis not present

## 2018-08-28 DIAGNOSIS — Z794 Long term (current) use of insulin: Secondary | ICD-10-CM | POA: Diagnosis not present

## 2018-08-28 DIAGNOSIS — I129 Hypertensive chronic kidney disease with stage 1 through stage 4 chronic kidney disease, or unspecified chronic kidney disease: Secondary | ICD-10-CM | POA: Diagnosis present

## 2018-08-28 DIAGNOSIS — S72454D Nondisplaced supracondylar fracture without intracondylar extension of lower end of right femur, subsequent encounter for closed fracture with routine healing: Secondary | ICD-10-CM

## 2018-08-28 DIAGNOSIS — Z87891 Personal history of nicotine dependence: Secondary | ICD-10-CM

## 2018-08-28 DIAGNOSIS — Z7901 Long term (current) use of anticoagulants: Secondary | ICD-10-CM | POA: Diagnosis not present

## 2018-08-28 DIAGNOSIS — E1122 Type 2 diabetes mellitus with diabetic chronic kidney disease: Secondary | ICD-10-CM | POA: Diagnosis present

## 2018-08-28 DIAGNOSIS — Z96651 Presence of right artificial knee joint: Secondary | ICD-10-CM | POA: Diagnosis present

## 2018-08-28 DIAGNOSIS — J9600 Acute respiratory failure, unspecified whether with hypoxia or hypercapnia: Secondary | ICD-10-CM

## 2018-08-28 DIAGNOSIS — I69351 Hemiplegia and hemiparesis following cerebral infarction affecting right dominant side: Secondary | ICD-10-CM | POA: Diagnosis not present

## 2018-08-28 DIAGNOSIS — J449 Chronic obstructive pulmonary disease, unspecified: Secondary | ICD-10-CM | POA: Diagnosis present

## 2018-08-28 LAB — CBC WITH DIFFERENTIAL/PLATELET
Abs Immature Granulocytes: 0.12 10*3/uL — ABNORMAL HIGH (ref 0.00–0.07)
BASOS PCT: 0 %
Basophils Absolute: 0 10*3/uL (ref 0.0–0.1)
EOS PCT: 1 %
Eosinophils Absolute: 0.2 10*3/uL (ref 0.0–0.5)
HCT: 30.3 % — ABNORMAL LOW (ref 39.0–52.0)
HEMOGLOBIN: 8.9 g/dL — AB (ref 13.0–17.0)
Immature Granulocytes: 1 %
Lymphocytes Relative: 5 %
Lymphs Abs: 0.6 10*3/uL — ABNORMAL LOW (ref 0.7–4.0)
MCH: 24.7 pg — AB (ref 26.0–34.0)
MCHC: 29.4 g/dL — AB (ref 30.0–36.0)
MCV: 83.9 fL (ref 80.0–100.0)
MONO ABS: 0.9 10*3/uL (ref 0.1–1.0)
MONOS PCT: 7 %
Neutro Abs: 10.1 10*3/uL — ABNORMAL HIGH (ref 1.7–7.7)
Neutrophils Relative %: 86 %
Platelets: 201 10*3/uL (ref 150–400)
RBC: 3.61 MIL/uL — AB (ref 4.22–5.81)
RDW: 15.9 % — ABNORMAL HIGH (ref 11.5–15.5)
WBC: 11.9 10*3/uL — ABNORMAL HIGH (ref 4.0–10.5)
nRBC: 0 % (ref 0.0–0.2)

## 2018-08-28 LAB — COMPREHENSIVE METABOLIC PANEL
ALK PHOS: 74 U/L (ref 38–126)
ALT: 22 U/L (ref 0–44)
ANION GAP: 9 (ref 5–15)
AST: 55 U/L — ABNORMAL HIGH (ref 15–41)
Albumin: 2.9 g/dL — ABNORMAL LOW (ref 3.5–5.0)
BUN: 32 mg/dL — ABNORMAL HIGH (ref 8–23)
CALCIUM: 8.7 mg/dL — AB (ref 8.9–10.3)
CO2: 26 mmol/L (ref 22–32)
CREATININE: 1.73 mg/dL — AB (ref 0.61–1.24)
Chloride: 106 mmol/L (ref 98–111)
GFR, EST AFRICAN AMERICAN: 40 mL/min — AB (ref >60)
GFR, EST NON AFRICAN AMERICAN: 34 mL/min — AB (ref >60)
Glucose, Bld: 207 mg/dL — ABNORMAL HIGH (ref 70–99)
Potassium: 4.4 mmol/L (ref 3.5–5.1)
SODIUM: 141 mmol/L (ref 135–145)
Total Bilirubin: 0.7 mg/dL (ref 0.3–1.2)
Total Protein: 6.5 g/dL (ref 6.5–8.1)

## 2018-08-28 LAB — BLOOD GAS, ARTERIAL
ACID-BASE EXCESS: 3.2 mmol/L — AB (ref 0.0–2.0)
Allens test (pass/fail): POSITIVE — AB
Bicarbonate: 26.3 mmol/L (ref 20.0–28.0)
DELIVERY SYSTEMS: POSITIVE
Expiratory PAP: 5
FIO2: 0.28
INSPIRATORY PAP: 10
O2 Saturation: 99.4 %
PATIENT TEMPERATURE: 37
pCO2 arterial: 33 mmHg (ref 32.0–48.0)
pH, Arterial: 7.51 — ABNORMAL HIGH (ref 7.350–7.450)
pO2, Arterial: 140 mmHg — ABNORMAL HIGH (ref 83.0–108.0)

## 2018-08-28 LAB — CG4 I-STAT (LACTIC ACID): Lactic Acid, Venous: 0.76 mmol/L (ref 0.5–1.9)

## 2018-08-28 LAB — GLUCOSE, CAPILLARY
GLUCOSE-CAPILLARY: 227 mg/dL — AB (ref 70–99)
Glucose-Capillary: 227 mg/dL — ABNORMAL HIGH (ref 70–99)

## 2018-08-28 LAB — BRAIN NATRIURETIC PEPTIDE: B Natriuretic Peptide: 169 pg/mL — ABNORMAL HIGH (ref 0.0–100.0)

## 2018-08-28 LAB — TROPONIN I: Troponin I: 0.03 ng/mL (ref <0.03)

## 2018-08-28 MED ORDER — ATORVASTATIN CALCIUM 20 MG PO TABS
20.0000 mg | ORAL_TABLET | Freq: Every day | ORAL | Status: DC
  Administered 2018-08-28 – 2018-08-29 (×2): 20 mg via ORAL
  Filled 2018-08-28 (×2): qty 1

## 2018-08-28 MED ORDER — ONDANSETRON HCL 4 MG PO TABS: 4.0000 mg | ORAL_TABLET | Freq: Four times a day (QID) | ORAL | Status: DC | PRN

## 2018-08-28 MED ORDER — INSULIN ASPART 100 UNIT/ML ~~LOC~~ SOLN
0.0000 [IU] | Freq: Three times a day (TID) | SUBCUTANEOUS | Status: DC
  Administered 2018-08-28 – 2018-08-29 (×2): 3 [IU] via SUBCUTANEOUS
  Administered 2018-08-29: 2 [IU] via SUBCUTANEOUS
  Administered 2018-08-29: 3 [IU] via SUBCUTANEOUS
  Administered 2018-08-30: 1 [IU] via SUBCUTANEOUS
  Filled 2018-08-28 (×5): qty 1

## 2018-08-28 MED ORDER — TRAMADOL HCL 50 MG PO TABS
50.0000 mg | ORAL_TABLET | Freq: Four times a day (QID) | ORAL | Status: DC | PRN
  Administered 2018-08-28 – 2018-08-30 (×4): 50 mg via ORAL
  Filled 2018-08-28 (×4): qty 1

## 2018-08-28 MED ORDER — ACETAMINOPHEN 325 MG PO TABS
650.0000 mg | ORAL_TABLET | Freq: Four times a day (QID) | ORAL | Status: DC | PRN
  Administered 2018-08-29: 650 mg via ORAL
  Filled 2018-08-28: qty 2

## 2018-08-28 MED ORDER — INSULIN ASPART PROT & ASPART (70-30 MIX) 100 UNIT/ML ~~LOC~~ SUSP
25.0000 [IU] | Freq: Two times a day (BID) | SUBCUTANEOUS | Status: DC
  Administered 2018-08-28 – 2018-08-30 (×4): 25 [IU] via SUBCUTANEOUS
  Filled 2018-08-28 (×4): qty 10

## 2018-08-28 MED ORDER — METOPROLOL SUCCINATE ER 50 MG PO TB24
100.0000 mg | ORAL_TABLET | Freq: Every day | ORAL | Status: DC
  Filled 2018-08-28 (×2): qty 2

## 2018-08-28 MED ORDER — PANTOPRAZOLE SODIUM 40 MG PO TBEC
40.0000 mg | DELAYED_RELEASE_TABLET | Freq: Every day | ORAL | Status: DC
  Administered 2018-08-29 – 2018-08-30 (×2): 40 mg via ORAL
  Filled 2018-08-28 (×2): qty 1

## 2018-08-28 MED ORDER — SODIUM CHLORIDE 0.9 % IV SOLN: 500.0000 mg | Freq: Once | INTRAVENOUS | Status: AC

## 2018-08-28 MED ORDER — IPRATROPIUM-ALBUTEROL 0.5-2.5 (3) MG/3ML IN SOLN
3.0000 mL | Freq: Four times a day (QID) | RESPIRATORY_TRACT | Status: DC
  Administered 2018-08-28 (×2): 3 mL via RESPIRATORY_TRACT
  Filled 2018-08-28 (×2): qty 3

## 2018-08-28 MED ORDER — RIVAROXABAN 15 MG PO TABS
15.0000 mg | ORAL_TABLET | Freq: Every day | ORAL | Status: DC
  Administered 2018-08-28 – 2018-08-29 (×2): 15 mg via ORAL
  Filled 2018-08-28 (×4): qty 1

## 2018-08-28 MED ORDER — BUDESONIDE 0.5 MG/2ML IN SUSP
0.5000 mg | Freq: Two times a day (BID) | RESPIRATORY_TRACT | Status: DC
  Administered 2018-08-28 – 2018-08-30 (×4): 0.5 mg via RESPIRATORY_TRACT
  Filled 2018-08-28 (×4): qty 2

## 2018-08-28 MED ORDER — FUROSEMIDE 40 MG PO TABS
40.0000 mg | ORAL_TABLET | Freq: Every day | ORAL | Status: DC
  Administered 2018-08-28 – 2018-08-29 (×2): 40 mg via ORAL
  Filled 2018-08-28 (×2): qty 1

## 2018-08-28 MED ORDER — OXYCODONE HCL 5 MG PO TABS
5.0000 mg | ORAL_TABLET | ORAL | Status: DC | PRN
  Administered 2018-08-30 (×2): 5 mg via ORAL
  Filled 2018-08-28 (×3): qty 1

## 2018-08-28 MED ORDER — METHYLPREDNISOLONE SODIUM SUCC 40 MG IJ SOLR
40.0000 mg | Freq: Three times a day (TID) | INTRAMUSCULAR | Status: DC
  Administered 2018-08-28 – 2018-08-29 (×3): 40 mg via INTRAVENOUS
  Filled 2018-08-28 (×3): qty 1

## 2018-08-28 MED ORDER — FERROUS SULFATE 325 (65 FE) MG PO TABS
325.0000 mg | ORAL_TABLET | Freq: Three times a day (TID) | ORAL | Status: DC
  Administered 2018-08-28 – 2018-08-30 (×6): 325 mg via ORAL
  Filled 2018-08-28 (×6): qty 1

## 2018-08-28 MED ORDER — SERTRALINE HCL 50 MG PO TABS
100.0000 mg | ORAL_TABLET | Freq: Every day | ORAL | Status: DC
  Administered 2018-08-28 – 2018-08-29 (×2): 100 mg via ORAL
  Filled 2018-08-28 (×2): qty 2

## 2018-08-28 MED ORDER — SODIUM CHLORIDE 0.9 % IV SOLN
500.0000 mg | Freq: Once | INTRAVENOUS | Status: AC
  Administered 2018-08-28: 500 mg via INTRAVENOUS
  Filled 2018-08-28: qty 500

## 2018-08-28 MED ORDER — INSULIN ASPART 100 UNIT/ML ~~LOC~~ SOLN
0.0000 [IU] | Freq: Every day | SUBCUTANEOUS | Status: DC
  Administered 2018-08-28: 2 [IU] via SUBCUTANEOUS
  Filled 2018-08-28: qty 1

## 2018-08-28 MED ORDER — IPRATROPIUM-ALBUTEROL 0.5-2.5 (3) MG/3ML IN SOLN
9.0000 mL | Freq: Once | RESPIRATORY_TRACT | Status: AC
  Administered 2018-08-28: 9 mL via RESPIRATORY_TRACT
  Filled 2018-08-28: qty 9

## 2018-08-28 MED ORDER — ONDANSETRON HCL 4 MG/2ML IJ SOLN: 4.0000 mg | Freq: Four times a day (QID) | INTRAMUSCULAR | Status: DC | PRN

## 2018-08-28 MED ORDER — LOSARTAN POTASSIUM 25 MG PO TABS
25.0000 mg | ORAL_TABLET | Freq: Every day | ORAL | Status: DC
  Administered 2018-08-28 – 2018-08-29 (×2): 25 mg via ORAL
  Filled 2018-08-28 (×2): qty 1

## 2018-08-28 MED ORDER — ACETAMINOPHEN 650 MG RE SUPP: 650.0000 mg | Freq: Four times a day (QID) | RECTAL | Status: DC | PRN

## 2018-08-28 MED ORDER — METHYLPREDNISOLONE SODIUM SUCC 125 MG IJ SOLR
125.0000 mg | Freq: Once | INTRAMUSCULAR | Status: AC
  Administered 2018-08-28: 125 mg via INTRAVENOUS
  Filled 2018-08-28: qty 2

## 2018-08-28 NOTE — Progress Notes (Signed)
Pt placed on EMS settings of CPAP of 8 35%, oxygen titrated down to 28%

## 2018-08-28 NOTE — ED Provider Notes (Addendum)
[facility] Emergency Department Provider Note  ____________________________________________   First MD Initiated Contact with Patient 08/28/18 [phone]     (approximate)  I have reviewed the triage vital signs and the nursing notes.   HISTORY  Chief Complaint Respiratory Distress   HPI Gary Thomas is a 84 y.o. male with a history of COPD, pulmonary emboli and a recent femur fracture who is presenting with shortness of breath.  EMS was called because of respiratory distress.  Patient initially found to be 81% on room air oxygen.  Placed on nasal cannula oxygen.  EMS was called and the patient was put on CPAP and his saturations came up to 98%.  The patient denies any chest pain but says that he has still has persistent pain to the right lower extremity.  Also says that he has pain to his left foot and ankle.  However, he is unable to say if he was injured to that location.  Says that this presentation feels like his previous COPD exacerbations.   Past Medical History:  Diagnosis Date  . Depression   . Diabetes mellitus without complication (HCC)   . Dyspnea   . Elevated lipids   . GERD (gastroesophageal reflux disease)   . Hypertension   . Pulmonary emboli (HCC)   . Renal insufficiency    only has 1 kidney  . Sleep apnea   . Stroke (HCC)    Rt side weakness    Patient Active Problem List   Diagnosis Date Noted  . Femur fracture (HCC) 08/22/2018  . Spondylolisthesis of lumbar region 02/15/2018  . PAD (peripheral artery disease) (HCC) 08/19/2017  . Hyperlipidemia 07/05/2017  . Essential hypertension 07/05/2017  . Bilateral carotid artery stenosis 07/05/2017  . Primary localized osteoarthritis of right hip 03/02/2017    Past Surgical History:  Procedure Laterality Date  . 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 AORTA SURGERY    . CAROTID ARTERY - SUBCLAVIAN ARTERY BYPASS GRAFT Bilateral   . CATARACT  EXTRACTION W/ INTRAOCULAR LENS  IMPLANT, BILATERAL    . CHOLECYSTECTOMY    . EYE SURGERY    . FEMUR FRACTURE SURGERY Right   . HIP FRACTURE SURGERY Bilateral   . JOINT REPLACEMENT    . ORIF FEMUR FRACTURE Right 08/24/2018   Procedure: OPEN REDUCTION INTERNAL FIXATION (ORIF) SUPRACONDYLAR FEMUR FRACTURE;  Surgeon: Christena Flake, MD;  Location: [facility] ORS;  Service: Orthopedics;  Laterality: Right;  . PROSTATE SURGERY    . REPLACEMENT TOTAL KNEE Right   . TOTAL HIP ARTHROPLASTY Right 03/02/2017   Procedure: TOTAL HIP ARTHROPLASTY ANTERIOR APPROACH;  Surgeon: Kennedy Bucker, MD;  Location: [facility] ORS;  Service: Orthopedics;  Laterality: Right;  Marland Kitchen VASCULAR SURGERY      Prior to Admission medications   Medication Sig Start Date End Date Taking? Authorizing Provider  atorvastatin (LIPITOR) 20 MG tablet Take 20 mg by mouth at bedtime.  02/02/17   [provider]  beta carotene w/minerals (OCUVITE) tablet Take 1 tablet by mouth at bedtime.     [provider]  ferrous sulfate 325 (65 FE) MG EC tablet Take 1 tablet (325 mg total) by mouth 3 (three) times daily with meals. 08/26/18   Mayo, Allyn Kenner, MD  furosemide (LASIX) 40 MG tablet Take 40 mg by mouth at bedtime.  02/09/17   [provider]  losartan (COZAAR) 25 MG tablet Take 25 mg by mouth at bedtime.  01/14/17  [provider]  metoprolol succinate (TOPROL-XL) 100 MG 24 hr tablet Take 100 mg by mouth at bedtime.  02/12/16   [provider]  NOVOLOG MIX 70/30 FLEXPEN (70-30) 100 UNIT/ML FlexPen Inject 25 Units into the skin 2 (two) times daily. 11/20/16   [provider]  omeprazole (PRILOSEC) 40 MG capsule Take by mouth daily. 08/04/18   [provider]  oxyCODONE (OXY IR/ROXICODONE) 5 MG immediate release tablet Take 1-2 tablets (5-10 mg total) by mouth every 4 (four) hours as needed for moderate pain (pain score 4-6). 08/26/18   Sharee Holster, NP  sertraline (ZOLOFT) 100 MG tablet Take 100  mg by mouth at bedtime.  01/23/17   [provider]  tiotropium (SPIRIVA HANDIHALER) 18 MCG inhalation capsule Place 1 capsule (18 mcg total) into inhaler and inhale daily. Patient not taking: Reported on 02/02/2018 04/07/17 04/07/18  Shane Crutch, MD  traMADol (ULTRAM) 50 MG tablet Take 1 tablet (50 mg total) by mouth every 6 (six) hours as needed for moderate pain. 08/26/18   Dedra Skeens, PA-C  XARELTO 15 MG TABS tablet Take 15 mg by mouth at bedtime.  07/18/18   [provider]    Allergies Codeine; Morphine; Gabapentin; Stadol [butorphanol]; Lyrica [pregabalin]; and Tapentadol  Family History  Problem Relation Age of Onset  . Heart attack Father   . Stroke Father   . Heart disease Father     Social History Social History   Tobacco Use  . Smoking status: Former Smoker    Last attempt to quit: 02/18/1952    Years since quitting: 66.5  . Smokeless tobacco: Former Engineer, water Use Topics  . Alcohol use: No  . Drug use: No    Review of Systems  Constitutional: No fever/chills Eyes: No visual changes. ENT: No sore throat. Cardiovascular: Denies chest pain. Respiratory: As above Gastrointestinal: No abdominal pain.  No nausea, no vomiting.  No diarrhea.  No constipation. Genitourinary: Negative for dysuria. Musculoskeletal: Negative for back pain. Skin: Negative for rash. Neurological: Negative for headaches, focal weakness or numbness.   ____________________________________________   PHYSICAL EXAM:  VITAL SIGNS: ED Triage Vitals  Enc Vitals Group     BP 08/28/18 0949 134/68     Pulse Rate 08/28/18 0949 83     Resp 08/28/18 0949 20     Temp 08/28/18 0949 97.6 F (36.4 C)     Temp Source 08/28/18 0949 Oral     SpO2 08/28/18 0947 95 %     Weight 08/28/18 0949 220 lb (99.8 kg)     Height 08/28/18 0949 5\' 10"  (1.778 m)     Head Circumference --      Peak Flow --      Pain Score 08/28/18 0951 0     Pain Loc --      Pain Edu? --       Excl. in GC? --     Constitutional: Alert and oriented.  Patient tolerating the CPAP well. Eyes: Conjunctivae are normal.  Head: Atraumatic. Nose: No congestion/rhinnorhea. Mouth/Throat: Mucous membranes are moist.  Neck: No stridor.   Cardiovascular: Normal rate, regular rhythm. Grossly normal heart sounds.   Respiratory: Labored respirations with little to no air movement especially on expiration.  Distant wheezes heard on expiration with a very prolonged expiratory phase. Gastrointestinal: Soft and nontender. No distention.  Musculoskeletal: Patient in the right lower extremity knee immobilizer.  Trace edema to the right foot.  Ecchymosis overlying the left ankle as well  as foot with mild tenderness to palpation.  Patient with full range of motion of the left and right feet. Neurologic:  Normal speech and language. No gross focal neurologic deficits are appreciated. Skin:  Skin is warm, dry and intact. No rash noted. Psychiatric: Mood and affect are normal. Speech and behavior are normal.  ____________________________________________   LABS (all labs ordered are listed, but only abnormal results are displayed)  Labs Reviewed  CBC WITH DIFFERENTIAL/PLATELET - Abnormal; Notable for the following components:      Result Value   WBC 11.9 (*)    RBC 3.61 (*)    Hemoglobin 8.9 (*)    HCT 30.3 (*)    MCH 24.7 (*)    MCHC 29.4 (*)    RDW 15.9 (*)    Neutro Abs 10.1 (*)    Lymphs Abs 0.6 (*)    Abs Immature Granulocytes 0.12 (*)    All other components within normal limits  COMPREHENSIVE METABOLIC PANEL - Abnormal; Notable for the following components:   Glucose, Bld 207 (*)    BUN 32 (*)    Creatinine, Ser 1.73 (*)    Calcium 8.7 (*)    Albumin 2.9 (*)    AST 55 (*)    GFR calc non Af Amer 34 (*)    GFR calc Af Amer 40 (*)    All other components within normal limits  TROPONIN I - Abnormal; Notable for the following components:   Troponin I 0.03 (*)    All other components  within normal limits  BRAIN NATRIURETIC PEPTIDE - Abnormal; Notable for the following components:   B Natriuretic Peptide 169.0 (*)    All other components within normal limits  I-STAT CG4 LACTIC ACID, ED  CG4 I-STAT (LACTIC ACID)   ____________________________________________  EKG  ED ECG REPORT I, Arelia Longestavid M Sybil Shrader, the attending physician, personally viewed and interpreted this ECG.   Date: 08/28/2018  EKG Time: 0954  Rate: 100  Rhythm: normal sinus rhythm  Axis: Normal  Intervals:none  ST&T Change: No ST segment elevation or depression.  No abnormal T wave inversion.  ____________________________________________  RADIOLOGY  No acute abnormality the chest x-ray nor to the left foot. ____________________________________________   PROCEDURES  Procedure(s) performed:   .Critical Care Performed by: Myrna BlazerSchaevitz, Opal Dinning Matthew, MD Authorized by: Myrna BlazerSchaevitz, Darly Massi Matthew, MD   Critical care provider statement:    Critical care time (minutes):  45   Critical care was necessary to treat or prevent imminent or life-threatening deterioration of the following conditions:  Respiratory failure   Critical care was time spent personally by me on the following activities:  Discussions with consultants, evaluation of patient's response to treatment, examination of patient, ordering and performing treatments and interventions, ordering and review of laboratory studies, ordering and review of radiographic studies, pulse oximetry, re-evaluation of patient's condition, obtaining history from patient or surrogate and review of old charts    Critical Care performed:    ____________________________________________   INITIAL IMPRESSION / ASSESSMENT AND PLAN / ED COURSE  Pertinent labs & imaging results that were available during my care of the patient were reviewed by me and considered in my medical decision making (see chart for details).  Differential includes, but is not limited to,  viral syndrome, bronchitis including COPD exacerbation, pneumonia, reactive airway disease including asthma, CHF including exacerbation with or without pulmonary/interstitial edema, pneumothorax, ACS, thoracic trauma, and pulmonary embolism. As part of my medical decision making, I reviewed the following data within the  electronic MEDICAL RECORD NUMBER Notes from prior ED visits  Recent x-ray reviewed from 11 November without fracture to the left ankle.  ----------------------------------------- 11:01 AM on 08/28/2018 -----------------------------------------  Patient tolerating the BiPAP well and says that he feels improved.  Patient to be admitted to the hospital.  Family as well as patient aware of diagnosis of COPD and need for admission.  Patient willing to comply with treatment plan.  Signed out to Dr. Cherlynn Kaiser.  Unlikely to be pulmonary embolus.  No chest pain.  Lung findings also consistent with COPD. ____________________________________________   FINAL CLINICAL IMPRESSION(S) / ED DIAGNOSES  Final diagnoses:  COPD exacerbation (HCC)  Hypoxia      NEW MEDICATIONS STARTED DURING THIS VISIT:  New Prescriptions   No medications on file     Note:  This document was prepared using Dragon voice recognition software and may include unintentional dictation errors.     Myrna Blazer, MD 08/28/18 1102    Antonette Hendricks, Myra Rude, MD 08/28/18 1102

## 2018-08-28 NOTE — Progress Notes (Signed)
Pt placed on RA based on ABG results, pt desat to 77. Pt placed back on 2Lnc with sats stabilized in low 90's.

## 2018-08-28 NOTE — Clinical Social Work Note (Signed)
Clinical Social Work Assessment  Patient Details  Name: Gary Thomas MRN: [id number] Date of Birth: 1932-10-08  Date of referral:  08/28/18               Reason for consult:  Facility Placement                Permission sought to share information with:  Chartered certified accountant granted to share information::  Yes, Verbal Permission Granted  Name::        Agency::  [facility]  Relationship::     Contact Information:     Housing/Transportation Living arrangements for the past 2 months:  [place], [place] of Information:  Patient, Medical Team, Facility, Spouse Patient Interpreter Needed:  None Criminal Activity/Legal Involvement Pertinent to Current Situation/Hospitalization:  No - Comment as needed Significant Relationships:  Adult Children, Warehouse manager, [place] Lives with:  Spouse Do you feel safe going back to the place where you live?  Yes Need for family participation in patient care:  No (Coment)  Care giving concerns:  Patient admitted from [facility] Worker assessment / plan:  The CSW met with the patient and his wife at bedside. The patient and his wife confirmed that he admitted from [facility] and would like to return when stable. The CSW explained the bed hold policies for SNF. The patient and his family had no further questions.  Lovena Le with [facility] is aware of his admission and has confirmed that he can return when stable. The CSW will assist with discharge facilitation when the patient is stable.  Employment status:  Retired Forensic scientist:  Medicare PT Recommendations:  Not assessed at this time Information / Referral to community resources:     Patient/Family's Response to care: The patient and his family thanked the CSW  Patient/Family's Understanding of and Emotional Response to Diagnosis, Current Treatment, and Prognosis: The patient and his family are in agreement with  the discharge plan.  Emotional Assessment Appearance:  Appears stated age Attitude/Demeanor/Rapport:  Gracious Affect (typically observed):  Pleasant, Stable Orientation:  Oriented to Self, Oriented to Place, Oriented to  Time, Oriented to Situation Alcohol / Substance use:  Never Used Psych involvement (Current and /or in the community):  No (Comment)  Discharge Needs  Concerns to be addressed:  Care Coordination, Discharge Planning Concerns Readmission within the last 30 days:  Yes Current discharge risk:  Chronically ill Barriers to Discharge:  Continued Medical Work up   [employer], LCSW 08/28/2018, 4:45 PM

## 2018-08-28 NOTE — ED Notes (Signed)
Per provider pt placed on bi-pap by Bambi RT

## 2018-08-28 NOTE — ED Notes (Signed)
Per admitting physician pt had trial on RA - pt desat to 74% - RT placed pt back on 2L O2 via n/c and sats improved to 94%

## 2018-08-28 NOTE — Progress Notes (Signed)
Neb tx given via low flow nebulizer that runs at 4L/min. Pt settings changed to BiPAP per MD order 10/5 R10 28%. Pt tol well, neb continues to run at 4L/min.

## 2018-08-28 NOTE — ED Triage Notes (Signed)
Pt arrived via ems for report of shortness of breath - he arrived on cpap - [facility] r/t right hip fracture - has brace on right leg - pt denies any pain unless right leg moved or left foot touched - Dr Pershing ProudSchaevitz at bedside

## 2018-08-28 NOTE — [place]2 (Signed)
[place] MEDICA[place] FL2 LEVEL OF CARE SCREENING TOOL     IDENTIFICATION  Patient Name: Gary Thomas Birthdate: Jan 30, 1932 Sex: male Admission Date (Current Location): 08/28/2018  [place] Number:  Chiropodist and Address:  [facility], [address] [place], [place] [address]      Provider Number: [id number]  Attending Physician Name and Address:  Houston Siren, MD  Relative Name and Phone Number:  [facility]on [facility] [phone]    Current Level of Care: Hospital Recommended Level of Care: Skilled Nursing Facility Prior Approval Number:    Date Approved/Denied:   PASRR Number: [id number]  Discharge Plan: SNF    Current Diagnoses: Patient Active Problem List   Diagnosis Date Noted  . Acute respiratory failure with hypoxia (HCC) 08/28/2018  . Femur fracture (HCC) 08/22/2018  . Spondylolisthesis of lumbar region 02/15/2018  . PAD (peripheral artery disease) (HCC) 08/19/2017  . Hyperlipidemia 07/05/2017  . Essential hypertension 07/05/2017  . Bilateral carotid artery stenosis 07/05/2017  . Primary localized osteoarthritis of right hip 03/02/2017    Orientation RESPIRATION BLADDER Height & Weight     Self, Time, Situation, Place  O2(2L o2) Urostomy Weight: 220 lb (99.8 kg) Height:  5\' 10"  (177.8 cm)  BEHAVIORAL SYMPTOMS/MOOD NEUROLOGICAL BOWEL NUTRITION STATUS      Continent Diet(Heart healthy/Carb modified)  AMBULATORY STATUS COMMUNICATION OF NEEDS Skin   Extensive Assist Verbally Normal                       Personal Care Assistance Level of Assistance  Bathing, Feeding, Dressing Bathing Assistance: Limited assistance Feeding assistance: Independent Dressing Assistance: Limited assistance     Functional Limitations Info  Sight, Hearing, Speech Sight Info: Adequate Hearing Info: Adequate Speech Info: Adequate    SPECIAL CARE FACTORS FREQUENCY  PT (By licensed PT), OT (By licensed OT)      PT Frequency: 5X per week OT Frequency: 3X per week            Contractures Contractures Info: Not present    Additional Factors Info  Code Status, Allergies Code Status Info: Full Allergies Info: Codeine, Morphine, Gabapentin, Stadol Butorphanol, Lyrica Pregabalin, Tapentadol           Current Medications (08/28/2018):  This is the current hospital active medication list Current Facility-Administered Medications  Medication Dose Route Frequency Provider Last Rate Last Dose  . acetaminophen (TYLENOL) tablet 650 mg  650 mg Oral Q6H PRN Houston Siren, MD       Or  . acetaminophen (TYLENOL) suppository 650 mg  650 mg Rectal Q6H PRN Houston Siren, MD      . atorvastatin (LIPITOR) tablet 20 mg  20 mg Oral QHS Sainani, Vivek J, MD      . budesonide (PULMICORT) nebulizer solution 0.5 mg  0.5 mg Nebulization BID Houston Siren, MD      . ferrous sulfate tablet 325 mg  325 mg Oral TID WC Sainani, Rolly Pancake, MD      . furosemide (LASIX) tablet 40 mg  40 mg Oral QHS Sainani, Rolly Pancake, MD      . insulin aspart (novoLOG) injection 0-5 Units  0-5 Units Subcutaneous QHS Sainani, Vivek J, MD      . insulin aspart (novoLOG) injection 0-9 Units  0-9 Units Subcutaneous TID WC Sainani, Vivek J, MD      . insulin aspart protamine- aspart (NOVOLOG MIX 70/30) injection 25 Units  25 Units Subcutaneous  BID WC Sainani, Vivek J, MD      . ipratropium-albuterol (DUONEB) 0.5-2.5 (3) MG/3ML nebulizer solution 3 mL  3 mL Nebulization Q6H Houston SirenSainani, Vivek J, MD   3 mL at 08/28/18 1509  . losartan (COZAAR) tablet 25 mg  25 mg Oral QHS Sainani, Rolly PancakeVivek J, MD      . methylPREDNISolone sodium succinate (SOLU-MEDROL) 40 mg/mL injection 40 mg  40 mg Intravenous Q8H Sainani, Rolly PancakeVivek J, MD      . metoprolol succinate (TOPROL-XL) 24 hr tablet 100 mg  100 mg Oral QHS Sainani, Rolly PancakeVivek J, MD      . ondansetron (ZOFRAN) tablet 4 mg  4 mg Oral Q6H PRN Houston SirenSainani, Vivek J, MD       Or  . ondansetron (ZOFRAN) injection 4 mg  4 mg  Intravenous Q6H PRN Houston SirenSainani, Vivek J, MD      . oxyCODONE (Oxy IR/ROXICODONE) immediate release tablet 5-10 mg  5-10 mg Oral Q4H PRN Houston SirenSainani, Vivek J, MD      . Melene Muller[START ON 08/29/2018] pantoprazole (PROTONIX) EC tablet 40 mg  40 mg Oral Daily Sainani, Rolly PancakeVivek J, MD      . Rivaroxaban (XARELTO) tablet 15 mg  15 mg Oral QHS Sainani, Rolly PancakeVivek J, MD      . sertraline (ZOLOFT) tablet 100 mg  100 mg Oral QHS Sainani, Rolly PancakeVivek J, MD      . traMADol (ULTRAM) tablet 50 mg  50 mg Oral Q6H PRN Houston SirenSainani, Vivek J, MD         Discharge Medications: Please see discharge summary for a list of discharge medications.  Relevant Imaging Results:  Relevant Lab Results:   Additional Information SS#[id number]  Judi CongKaren M Holland Nickson, LCSW

## 2018-08-28 NOTE — ED Notes (Addendum)
Bambi RT states that she placed pt on cpap not bi-pap - informed RT that the order was for cpap - she stated that the pt does not need bi-pap and I advised RT that she would need to speak to physician -  She stated she will talk to the physician about changing the order - RT also placed pt on low flow O2 and reports that she will inform doctor that this is what they felt pt needed - pt given nebulizer but RT states to run with 4L of O2 and refused to increase

## 2018-08-28 NOTE — ED Notes (Signed)
ED TO INPATIENT HANDOFF REPORT  Name/Age/Gender Gary Thomas 84 y.o. male  Code Status Code Status History    Date Active Date Inactive Code Status Order ID Comments User Context   08/23/2018 0125 08/26/2018 2201 Full Code [id number]  [name], MD Inpatient   02/15/2018 2012 02/21/2018 1700 Full Code [id number]  [name], MD Inpatient   03/02/2017 1938 03/05/2017 1439 Full Code [id number]  [name], MD Inpatient      Home/SNF/Other Skilled nursing facility  Chief Complaint Resp Distress  Level of Care/Admitting Diagnosis ED Disposition    ED Disposition Condition Gary Thomas [[id number]]  Level of Care: Med-Surg [16]  Diagnosis: Acute respiratory failure with hypoxia [facility]) [[id number]]  Admitting Physician: Henreitta Leber [[id number]]  Attending Physician: Henreitta Leber [[id number]]  Estimated length of stay: past midnight tomorrow  Certification:: I certify this patient will need inpatient services for at least 2 midnights  PT Class (Do Not Modify): Inpatient [101]  PT Acc Code (Do Not Modify): Private [1]       Medical History Past Medical History:  Diagnosis Date  . Depression   . Diabetes mellitus without complication ([place])   . Dyspnea   . Elevated lipids   . GERD (gastroesophageal reflux disease)   . Hypertension   . Pulmonary emboli ([place])   . Renal insufficiency    only has 1 kidney  . Sleep apnea   . Stroke ([place])    Rt side weakness    Allergies Allergies  Allergen Reactions  . Codeine Anaphylaxis  . Morphine Anaphylaxis  . Gabapentin Other (See Comments)    Unsure of what the reaction was   . Stadol [Butorphanol]     Unknown  . Lyrica [Pregabalin] Rash  . Tapentadol Nausea Only    ONSET 01/04/2013    IV Location/Drains/Wounds Patient Lines/Drains/Airways Status   Active Line/Drains/Airways    Name:   Placement date:   Placement time:   Site:   Days:   Peripheral IV 08/28/18 Left  Antecubital   08/28/18    0946    Antecubital   less than 1   Peripheral IV 08/28/18 Right Forearm   08/28/18    1003    Forearm   less than 1   Urostomy Ureterostomy right RLQ   02/17/18    0800    RLQ   192   Incision (Closed) 03/02/17 Hip Right   03/02/17    1603     544   Incision (Closed) 02/15/18 Back   02/15/18    1556     194   Incision (Closed) 08/24/18 Leg Right   08/24/18    1743     4          Labs/Imaging Results for orders placed or performed during the hospital encounter of 08/28/18 (from the past 48 hour(s))  CBC with Differential     Status: Abnormal   Collection Time: 08/28/18  9:44 AM  Result Value Ref Range   WBC 11.9 (H) 4.0 - 10.5 K/uL   RBC 3.61 (L) 4.22 - 5.81 MIL/uL   Hemoglobin 8.9 (L) 13.0 - 17.0 g/dL   HCT 30.3 (L) 39.0 - 52.0 %   MCV 83.9 80.0 - 100.0 fL   MCH 24.7 (L) 26.0 - 34.0 pg   MCHC 29.4 (L) 30.0 - 36.0 g/dL   RDW 15.9 (H) 11.5 - 15.5 %   Platelets 201 150 - 400 K/uL  nRBC 0.0 0.0 - 0.2 %   Neutrophils Relative % 86 %   Neutro Abs 10.1 (H) 1.7 - 7.7 K/uL   Lymphocytes Relative 5 %   Lymphs Abs 0.6 (L) 0.7 - 4.0 K/uL   Monocytes Relative 7 %   Monocytes Absolute 0.9 0.1 - 1.0 K/uL   Eosinophils Relative 1 %   Eosinophils Absolute 0.2 0.0 - 0.5 K/uL   Basophils Relative 0 %   Basophils Absolute 0.0 0.0 - 0.1 K/uL   Immature Granulocytes 1 %   Abs Immature Granulocytes 0.12 (H) 0.00 - 0.07 K/uL    Comment: Performed at [facility], [address] [place], [place] [address]  Comprehensive metabolic panel     Status: Abnormal   Collection Time: 08/28/18  9:44 AM  Result Value Ref Range   Sodium 141 135 - 145 mmol/L   Potassium 4.4 3.5 - 5.1 mmol/L   Chloride 106 98 - 111 mmol/L   CO2 26 22 - 32 mmol/L   Glucose, Bld 207 (H) 70 - 99 mg/dL   BUN 32 (H) 8 - 23 mg/dL   Creatinine, Ser 1.73 (H) 0.61 - 1.24 mg/dL   Calcium 8.7 (L) 8.9 - 10.3 mg/dL   Total Protein 6.5 6.5 - 8.1 g/dL   Albumin 2.9 (L) 3.5 - 5.0 g/dL   AST 55 (H)  15 - 41 U/L   ALT 22 0 - 44 U/L   Alkaline Phosphatase 74 38 - 126 U/L   Total Bilirubin 0.7 0.3 - 1.2 mg/dL   GFR calc non Af Amer 34 (L) >60 mL/min   GFR calc Af Amer 40 (L) >60 mL/min    Comment: (NOTE) The eGFR has been calculated using the CKD EPI equation. This calculation has not been validated in all clinical situations. eGFR's persistently <60 mL/min signify possible Chronic Kidney Disease.    Anion gap 9 5 - 15    Comment: Performed at [facility], [address] [place], [place] [address]  Troponin I - ONCE - STAT     Status: Abnormal   Collection Time: 08/28/18  9:44 AM  Result Value Ref Range   Troponin I 0.03 (HH) <0.03 ng/mL    Comment: CRITICAL RESULT CALLED TO, READ BACK BY AND VERIFIED WITH Chayah Mckee AT 1019 08/28/18.PMH Performed at [facility], [address] [place], [place] [address]   Brain natriuretic peptide     Status: Abnormal   Collection Time: 08/28/18  9:44 AM  Result Value Ref Range   B Natriuretic Peptide 169.0 (H) 0.0 - 100.0 pg/mL    Comment: Performed at [facility], [place], [place] [address]  CG4 I-STAT (Lactic acid)     Status: None   Collection Time: 08/28/18  9:54 AM  Result Value Ref Range   Lactic Acid, Venous 0.76 0.5 - 1.9 mmol/L  Blood gas, arterial     Status: Abnormal   Collection Time: 08/28/18 11:33 AM  Result Value Ref Range   FIO2 0.28    Delivery systems BILEVEL POSITIVE AIRWAY PRESSURE    Inspiratory PAP 10    Expiratory PAP 5.0    pH, Arterial 7.51 (H) 7.350 - 7.450   pCO2 arterial 33 32.0 - 48.0 mmHg   pO2, Arterial 140 (H) 83.0 - 108.0 mmHg   Bicarbonate 26.3 20.0 - 28.0 mmol/L   Acid-Base Excess 3.2 (H) 0.0 - 2.0 mmol/L   O2 Saturation 99.4 %   Patient temperature 37.0  Collection site LEFT RADIAL    Sample type ARTERIAL DRAW    Allens test (pass/fail) POSITIVE (A) PASS    Comment: Performed at [facility], [address] [place], [place]  [address]   Dg Chest 1 View  Result Date: 08/28/2018 CLINICAL DATA:  Shortness of breath. EXAM: CHEST  1 VIEW COMPARISON:  08/24/2018. FINDINGS: Stable elevation of the right hemidiaphragm with mild adjacent atelectasis. Otherwise, clear lungs with normal vascularity. Normal sized heart. Diffuse osteopenia. Left neck surgical clips and probable surgical clips at the medial right lung apex without significant change. Unremarkable bones. IMPRESSION: No acute abnormality. Electronically Signed   By: Claudie Revering M.D.   On: 08/28/2018 10:41   Dg Foot Complete Left  Result Date: 08/28/2018 CLINICAL DATA:  Left foot pain since a fall 1 week ago. EXAM: LEFT FOOT - COMPLETE 3+ VIEW COMPARISON:  Left ankle dated 08/22/2018. FINDINGS: Old, healed 1st metatarsal fracture. Mild calcaneal spur formation. No acute fracture or dislocation seen. Mild dorsal tarsal spur formation. Diffuse arterial calcifications. IMPRESSION: No acute fracture or dislocation. Electronically Signed   By: Claudie Revering M.D.   On: 08/28/2018 10:42    Pending Labs [employer] (From admission, onward)    Start     Ordered   Signed and Occupational hygienist morning,   R     Signed and Held   Signed and Held  CBC  Tomorrow morning,   R     Signed and Held          Vitals/Pain Today's Vitals   08/28/18 1235 08/28/18 1236 08/28/18 1237 08/28/18 1238  BP:      Pulse:      Resp:      Temp:      TempSrc:      SpO2: (!) 74% (!) 77% 94% 94%  Weight:      Height:      PainSc:        Isolation Precautions No active isolations  Medications Medications  azithromycin (ZITHROMAX) 500 mg in sodium chloride 0.9 % 250 mL IVPB (has no administration in time range)  insulin aspart (novoLOG) injection 0-9 Units (has no administration in time range)  insulin aspart (novoLOG) injection 0-5 Units (has no administration in time range)  methylPREDNISolone sodium succinate (SOLU-MEDROL) 125 mg/2 mL injection 125 mg (125  mg Intravenous Given 08/28/18 1004)  ipratropium-albuterol (DUONEB) 0.5-2.5 (3) MG/3ML nebulizer solution 9 mL (9 mLs Nebulization Given 08/28/18 1004)  azithromycin (ZITHROMAX) 500 mg in sodium chloride 0.9 % 250 mL IVPB (0 mg Intravenous Stopped 08/28/18 1240)    Mobility non-ambulatory

## 2018-08-28 NOTE — ED Notes (Signed)
Attempted to call report and was advised that the charge nurse was reviewing chart and they would call back

## 2018-08-28 NOTE — Progress Notes (Signed)
ABG drawn and pt removed from BiPAP and placed on 2Lnc per MD request. Will continue to monitor

## 2018-08-28 NOTE — H&P (Signed)
[employer] - [place] [facility]    PATIENT NAME: Gary Thomas    MR#:  [id number]  DATE OF BIRTH:  17-Sep-1932  DATE OF ADMISSION:  08/28/2018  PRIMARY CARE PHYSICIAN: Marguarite Arbour, MD   REQUESTING/REFERRING PHYSICIAN: Dr. Gladstone Pih  CHIEF COMPLAINT:   Chief Complaint  Patient presents with  . Respiratory Distress  Acute respiratory failure with hypoxia  HISTORY OF PRESENT ILLNESS:  Gary Thomas  is a 84 y.o. male with a known history of obstructive sleep apnea, history of pulmonary embolism, hypertension, GERD, diabetes, depression, history of previous CVA, recent admission due to right hip periprosthetic femur fracture who presents to the hospital from skilled nursing facility due to shortness of breath and hypoxia.  Patient was in his usual state of health and was discharged to rehab for physical therapy after his hip fracture and now returns back as he was complaining of shortness of breath noted to be hypoxic at the skilled nursing facility.  Patient says that he had some phlegm in the back of his throat which he could not cough out and therefore was placed on CPAP and brought to the ER.  Patient was noted to be significantly hypoxic and working to breathe and therefore placed on BiPAP.  After being on BiPAP he feels a lot better now, he denies any significant cough congestion, chest pain, nausea vomiting or any other associated symptoms presently.  Given his respiratory distress and hypoxemia hospitalist services were contacted for admission.  PAST MEDICAL HISTORY:   Past Medical History:  Diagnosis Date  . Depression   . Diabetes mellitus without complication (HCC)   . Dyspnea   . Elevated lipids   . GERD (gastroesophageal reflux disease)   . Hypertension   . Pulmonary emboli (HCC)   . Renal insufficiency    only has 1 kidney  . Sleep apnea   . Stroke (HCC)    Rt side weakness    PAST SURGICAL HISTORY:   Past Surgical History:    Procedure Laterality Date  . 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 AORTA SURGERY    . CAROTID ARTERY - SUBCLAVIAN ARTERY BYPASS GRAFT Bilateral   . CATARACT EXTRACTION W/ INTRAOCULAR LENS  IMPLANT, BILATERAL    . CHOLECYSTECTOMY    . EYE SURGERY    . FEMUR FRACTURE SURGERY Right   . HIP FRACTURE SURGERY Bilateral   . JOINT REPLACEMENT    . ORIF FEMUR FRACTURE Right 08/24/2018   Procedure: OPEN REDUCTION INTERNAL FIXATION (ORIF) SUPRACONDYLAR FEMUR FRACTURE;  Surgeon: Christena Flake, MD;  Location: [facility] ORS;  Service: Orthopedics;  Laterality: Right;  . PROSTATE SURGERY    . REPLACEMENT TOTAL KNEE Right   . TOTAL HIP ARTHROPLASTY Right 03/02/2017   Procedure: TOTAL HIP ARTHROPLASTY ANTERIOR APPROACH;  Surgeon: Kennedy Bucker, MD;  Location: [facility] ORS;  Service: Orthopedics;  Laterality: Right;  Marland Kitchen VASCULAR SURGERY      SOCIAL HISTORY:   Social History   Tobacco Use  . Smoking status: Former Smoker    Packs/day: 1.00    Years: 7.00    Pack years: 7.00    Types: Cigarettes    Last attempt to quit: 02/18/1952    Years since quitting: 66.5  . Smokeless tobacco: Former Engineer, water Use Topics  . Alcohol use: No    FAMILY HISTORY:   Family History  Problem Relation Age of Onset  . Heart attack Father   .  Stroke Father   . Heart disease Father   . Heart attack Mother     DRUG ALLERGIES:   Allergies  Allergen Reactions  . Codeine Anaphylaxis  . Morphine Anaphylaxis  . Gabapentin Other (See Comments)    Unsure of what the reaction was   . Stadol [Butorphanol]     Unknown  . Lyrica [Pregabalin] Rash  . Tapentadol Nausea Only    ONSET 01/04/2013    REVIEW OF SYSTEMS:   Review of Systems  Constitutional: Negative for fever and weight loss.  HENT: Negative for congestion, nosebleeds and tinnitus.   Eyes: Negative for blurred vision, double vision and redness.  Respiratory: Positive for shortness of breath. Negative for  hemoptysis.   Cardiovascular: Negative for chest pain, orthopnea, leg swelling and PND.  Gastrointestinal: Negative for abdominal pain, diarrhea, melena, nausea and vomiting.  Genitourinary: Negative for dysuria, hematuria and urgency.  Musculoskeletal: Negative for falls and joint pain.  Neurological: Positive for weakness (generalized). Negative for dizziness, tingling, sensory change, focal weakness, seizures and headaches.  Endo/Heme/Allergies: Negative for polydipsia. Does not bruise/bleed easily.  Psychiatric/Behavioral: Negative for depression and memory loss. The patient is not nervous/anxious.     MEDICATIONS AT HOME:   Prior to Admission medications   Medication Sig Start Date End Date Taking? Authorizing Provider  atorvastatin (LIPITOR) 20 MG tablet Take 20 mg by mouth at bedtime.  02/02/17  Yes [provider]  ferrous sulfate 325 (65 FE) MG EC tablet Take 1 tablet (325 mg total) by mouth 3 (three) times daily with meals. 08/26/18  Yes Mayo, Allyn KennerKaty Dodd, MD  furosemide (LASIX) 40 MG tablet Take 40 mg by mouth at bedtime.  02/09/17  Yes [provider]  losartan (COZAAR) 25 MG tablet Take 25 mg by mouth at bedtime.  01/14/17  Yes [provider]  metoprolol succinate (TOPROL-XL) 100 MG 24 hr tablet Take 100 mg by mouth at bedtime.  02/12/16  Yes [provider]  NOVOLOG MIX 70/30 FLEXPEN (70-30) 100 UNIT/ML FlexPen Inject 25 Units into the skin 2 (two) times daily. 11/20/16  Yes [provider]  omeprazole (PRILOSEC) 40 MG capsule Take by mouth daily. 08/04/18  Yes [provider]  oxyCODONE (OXY IR/ROXICODONE) 5 MG immediate release tablet Take 1-2 tablets (5-10 mg total) by mouth every 4 (four) hours as needed for moderate pain (pain score 4-6). 08/26/18  Yes Sharee HolsterGreen, Deborah S, NP  sertraline (ZOLOFT) 100 MG tablet Take 100 mg by mouth at bedtime.  01/23/17  Yes [provider]  tiotropium (SPIRIVA HANDIHALER) 18 MCG inhalation  capsule Place 1 capsule (18 mcg total) into inhaler and inhale daily. 04/07/17 08/28/18 Yes Shane Crutchamachandran, Pradeep, MD  traMADol (ULTRAM) 50 MG tablet Take 1 tablet (50 mg total) by mouth every 6 (six) hours as needed for moderate pain. 08/26/18  Yes Dedra SkeensMundy, Todd, PA-C  XARELTO 15 MG TABS tablet Take 15 mg by mouth at bedtime.  07/18/18  Yes [provider]      VITAL SIGNS:  Blood pressure 135/60, pulse 75, temperature 97.6 F (36.4 C), temperature source Oral, resp. rate 15, height 5\' 10"  (1.778 m), weight 99.8 kg, SpO2 97 %.  PHYSICAL EXAMINATION:  Physical Exam  GENERAL:  84 y.o.-year-old patient lying in the bed in mild Resp. Distress on Bipap  EYES: Pupils equal, round, reactive to light and accommodation. No scleral icterus. Extraocular muscles intact.  HEENT: Head atraumatic, normocephalic. Oropharynx and nasopharynx clear. No oropharyngeal erythema, moist oral mucosa  NECK:  Supple, no jugular venous distention. No thyroid enlargement, no tenderness.  LUNGS: good A/e b/l, no wheezing, rales, rhonchi. + use of accessory muscles of respiration.  CARDIOVASCULAR: S1, S2 RRR. No murmurs, rubs, gallops, clicks.  ABDOMEN: Soft, nontender, nondistended. Bowel sounds present. No organomegaly or mass.  EXTREMITIES: No pedal edema, cyanosis, or clubbing. + 2 pedal & radial pulses b/l. RLE in a immobilizer.  NEUROLOGIC: Cranial nerves II through XII are intact. No focal Motor or sensory deficits appreciated b/l. Globally weak. PSYCHIATRIC: The patient is alert and oriented x 3. Good affect.  SKIN: No obvious rash, lesion, or ulcer.   LABORATORY PANEL:   CBC Recent Labs  Lab 08/28/18 0944  WBC 11.9*  HGB 8.9*  HCT 30.3*  PLT 201   ------------------------------------------------------------------------------------------------------------------  Chemistries  Recent Labs  Lab 08/28/18 0944  NA 141  K 4.4  CL 106  CO2 26  GLUCOSE 207*  BUN 32*  CREATININE 1.73*  CALCIUM  8.7*  AST 55*  ALT 22  ALKPHOS 74  BILITOT 0.7   ------------------------------------------------------------------------------------------------------------------  Cardiac Enzymes Recent Labs  Lab 08/28/18 0944  TROPONINI 0.03*   ------------------------------------------------------------------------------------------------------------------  RADIOLOGY:  Dg Chest 1 View  Result Date: 08/28/2018 CLINICAL DATA:  Shortness of breath. EXAM: CHEST  1 VIEW COMPARISON:  08/24/2018. FINDINGS: Stable elevation of the right hemidiaphragm with mild adjacent atelectasis. Otherwise, clear lungs with normal vascularity. Normal sized heart. Diffuse osteopenia. Left neck surgical clips and probable surgical clips at the medial right lung apex without significant change. Unremarkable bones. IMPRESSION: No acute abnormality. Electronically Signed   By: Beckie Salts M.D.   On: 08/28/2018 10:41   Dg Foot Complete Left  Result Date: 08/28/2018 CLINICAL DATA:  Left foot pain since a fall 1 week ago. EXAM: LEFT FOOT - COMPLETE 3+ VIEW COMPARISON:  Left ankle dated 08/22/2018. FINDINGS: Old, healed 1st metatarsal fracture. Mild calcaneal spur formation. No acute fracture or dislocation seen. Mild dorsal tarsal spur formation. Diffuse arterial calcifications. IMPRESSION: No acute fracture or dislocation. Electronically Signed   By: Beckie Salts M.D.   On: 08/28/2018 10:42     IMPRESSION AND PLAN:   84 year old male with past medical history of obstructive sleep apnea, diabetes, hypertension, history of pulmonary embolism, history of previous CVA, GERD, depression, recent admission for a right periprosthetic femur fracture status post ORIF, returns back to the hospital secondary to shortness of breath and hypoxemia.  1.  Acute respiratory failure with hypoxia- suspected to be secondary to underlying sleep apnea combined with mild COPD exacerbation. - Initially patient was placed on BiPAP but will get ABG  and attempt to wean off the BiPAP for now.  Continue O2 supplementation, continue nocturnal CPAP/BiPAP if needed. - Treat underlying COPD with IV steroids, scheduled duo nebs, Pulmicort nebs.  2.  COPD exacerbation-because of patient's worsening shortness of breath and hypoxemia.  We will treat the patient with IV steroids, scheduled duo nebs, Pulmicort nebs.  Chest x-ray is negative for acute pneumonia.  3.  Right hip periprosthetic fracture status post ORIF- continue pain control with as needed oxycodone, tramadol. -Continue Xarelto for DVT prophylaxis.  Continue PT as tolerated.  4.  Diabetes type 2 without complication-continue NovoLog 70/30 mix, carb controlled diet, sliding scale insulin, follow blood sugars.  5.  GERD-continue Protonix.  6.  Essential hypertension-continue Toprol, losartan.  7.  Hyperlipidemia-continue atorvastatin.    All the records are reviewed and case discussed with ED provider. Management plans discussed with the patient, family and they  are in agreement.  CODE STATUS: Full code  TOTAL TIME TAKING CARE OF THIS PATIENT: 45 minutes.    Houston Siren M.D on 08/28/2018 at 11:52 AM  Between 7am to 6pm - Pager - [phone]  After 6pm go to [email or web address] - password [email or web address] [facility]  [place] [facility]  Office  [phone]  CC: Primary care physician; Marguarite Arbour, MD

## 2018-08-29 LAB — GLUCOSE, CAPILLARY
GLUCOSE-CAPILLARY: 204 mg/dL — AB (ref 70–99)
GLUCOSE-CAPILLARY: 207 mg/dL — AB (ref 70–99)
Glucose-Capillary: 166 mg/dL — ABNORMAL HIGH (ref 70–99)
Glucose-Capillary: 108 mg/dL — ABNORMAL HIGH (ref 70–99)

## 2018-08-29 LAB — CBC
HCT: 27.1 % — ABNORMAL LOW (ref 39.0–52.0)
HEMOGLOBIN: 7.9 g/dL — AB (ref 13.0–17.0)
MCH: 24.7 pg — ABNORMAL LOW (ref 26.0–34.0)
MCHC: 29.2 g/dL — AB (ref 30.0–36.0)
MCV: 84.7 fL (ref 80.0–100.0)
Platelets: 183 10*3/uL (ref 150–400)
RBC: 3.2 MIL/uL — AB (ref 4.22–5.81)
RDW: 15.8 % — ABNORMAL HIGH (ref 11.5–15.5)
WBC: 12.3 10*3/uL — AB (ref 4.0–10.5)
nRBC: 0 % (ref 0.0–0.2)

## 2018-08-29 LAB — BASIC METABOLIC PANEL
ANION GAP: 4 — AB (ref 5–15)
BUN: 45 mg/dL — AB (ref 8–23)
CO2: 28 mmol/L (ref 22–32)
Calcium: 8.6 mg/dL — ABNORMAL LOW (ref 8.9–10.3)
Chloride: 109 mmol/L (ref 98–111)
Creatinine, Ser: 1.78 mg/dL — ABNORMAL HIGH (ref 0.61–1.24)
GFR calc Af Amer: 39 mL/min — ABNORMAL LOW (ref >60)
GFR, EST NON AFRICAN AMERICAN: 33 mL/min — AB (ref >60)
Glucose, Bld: 211 mg/dL — ABNORMAL HIGH (ref 70–99)
POTASSIUM: 4.5 mmol/L (ref 3.5–5.1)
SODIUM: 141 mmol/L (ref 135–145)

## 2018-08-29 MED ORDER — METHYLPREDNISOLONE SODIUM SUCC 40 MG IJ SOLR
40.0000 mg | Freq: Every day | INTRAMUSCULAR | Status: DC
  Administered 2018-08-30: 40 mg via INTRAVENOUS
  Filled 2018-08-29: qty 1

## 2018-08-29 MED ORDER — AZITHROMYCIN 500 MG PO TABS
500.0000 mg | ORAL_TABLET | Freq: Every day | ORAL | Status: DC
  Administered 2018-08-29 – 2018-08-30 (×2): 500 mg via ORAL
  Filled 2018-08-29 (×2): qty 1

## 2018-08-29 MED ORDER — IPRATROPIUM-ALBUTEROL 0.5-2.5 (3) MG/3ML IN SOLN
3.0000 mL | Freq: Two times a day (BID) | RESPIRATORY_TRACT | Status: DC
  Administered 2018-08-29 – 2018-08-30 (×3): 3 mL via RESPIRATORY_TRACT
  Filled 2018-08-29 (×3): qty 3

## 2018-08-29 NOTE — Evaluation (Signed)
Physical Therapy Evaluation Patient Details Name: Gary Thomas MRN: [id number] DOB: 1932/04/05 Today's Date: 08/29/2018   History of Present Illness  84 yo male with periprosthetic fracture on R femur was referred to PT with WBAT permission, was sent to rehab 11/15.  Now readmitted with SOB, acute hypoxic respiratory failure and COPD exacerbation, acute bronchitis, with DM, PMHx:  CVA, R hip prosthesis with ORIF from periprosthetic fracture, DM, PE, sleep apnea, HTN, renal insufficiency, stroke with R hemiparesis  Clinical Impression  Pt was seen for evaluation of mobility and strength, noted his efforts were hindered by pain on L great toe gout, R LE brace.  Pt is demonstrating low effort, and will expect him to increase active participation as he gets meds for gout in his system.  Follow acutely for strength and balance as tolerated.    Follow Up Recommendations SNF    Equipment Recommendations  None recommended by PT    Recommendations for Other Services       Precautions / Restrictions Precautions Precautions: Fall Required Braces or Orthoses: Other Brace/Splint(R knee bledsoe brace) Restrictions Weight Bearing Restrictions: Yes RLE Weight Bearing: Weight bearing as tolerated      Mobility  Bed Mobility Overal bed mobility: Needs Assistance Bed Mobility: Supine to Sit;Sit to Supine     Supine to sit: Max assist Sit to supine: Max assist      Transfers Overall transfer level: Needs assistance Equipment used: Rolling walker (2 wheeled) Transfers: Sit to/from Stand;Lateral/Scoot Transfers Sit to Stand: Total assist        Lateral/Scoot Transfers: Mod assist General transfer comment: unable to stand but could laterally scoot for one foot  Ambulation/Gait       Gait Pattern/deviations: (unable)        Stairs            Wheelchair Mobility    Modified Rankin (Stroke Patients Only)       Balance Overall balance assessment: Needs  assistance Sitting-balance support: Bilateral upper extremity supported Sitting balance-Leahy Scale: Fair                                       Pertinent Vitals/Pain Pain Assessment: Faces Faces Pain Scale: Hurts even more Pain Location: R leg and L great toe Pain Descriptors / Indicators: Operative site guarding Pain Intervention(s): Limited activity within patient's tolerance;Monitored during session;Premedicated before session;Repositioned    Home Living Family/patient expects to be discharged to:: Skilled nursing facility Living Arrangements: Spouse/significant other                    Prior Function Level of Independence: Independent with assistive device(s)   Gait / Transfers Assistance Needed: SPC or RW with assistance  ADL's / Homemaking Assistance Needed: wife takes care of home, pt was not clear on what she does for him        Hand Dominance        Extremity/Trunk Assessment                Communication      Cognition Arousal/Alertness: Awake/alert Behavior During Therapy: WFL for tasks assessed/performed Overall Cognitive Status: Within Functional Limits for tasks assessed                                        General  Comments      Exercises     Assessment/Plan    PT Assessment Patient needs continued PT services  PT Problem List Decreased strength;Decreased range of motion;Decreased activity tolerance;Decreased balance;Decreased mobility;Decreased coordination;Decreased knowledge of use of DME;Decreased safety awareness;Cardiopulmonary status limiting activity       PT Treatment Interventions DME instruction;Stair training;Gait training;Therapeutic exercise;Therapeutic activities;Functional mobility training;Balance training;Neuromuscular re-education;Patient/family education    PT Goals (Current goals can be found in the Care Plan section)  Acute Rehab PT Goals Patient Stated Goal: to get home with  wife and rest PT Goal Formulation: With patient Potential to Achieve Goals: Fair    Frequency Min 2X/week   Barriers to discharge Inaccessible home environment;Decreased caregiver support home with stairs but not defined     Co-evaluation               AM-PAC PT "6 Clicks" Daily Activity  Outcome Measure   Difficulty moving from lying on back to sitting on the side of the bed? : Unable Difficulty sitting down on and standing up from a chair with arms (e.g., wheelchair, bedside commode, etc,.)?: Unable Help needed moving to and from a bed to chair (including a wheelchair)?: Total Help needed walking in hospital room?: Total Help needed climbing 3-5 steps with a railing? : Total 6 Click Score: 5    End of Session Equipment Utilized During Treatment: Right knee immobilizer;Gait belt Activity Tolerance: Patient limited by fatigue Patient left: in bed;with call bell/phone within reach;with bed alarm set Nurse Communication: Mobility status PT Visit Diagnosis: Difficulty in walking, not elsewhere classified (R26.2);Muscle weakness (generalized) (M62.81)    Time: 1610-9604 PT Time Calculation (min) (ACUTE ONLY): 27 min   Charges:   PT Evaluation $PT Eval Moderate Complexity: 1 Mod PT Treatments $Therapeutic Exercise: 8-22 mins $Therapeutic Activity: 8-22 mins        Gary Thomas 08/29/2018, 7:47 PM   Samul Dada, PT MS Acute Rehab Dept. Number: [facility] [id number] and [facility] [phone]

## 2018-08-29 NOTE — Progress Notes (Signed)
Family Meeting Note  Advance Directive:no  Today a meeting took place with the Patient.  The following clinical team members were present during this meeting:MD  The following were discussed:Patient's diagnosis: osa copd, Patient's progosis: Unable to determine and Goals for treatment: Full Code  Additional follow-up to be provided: FULL CODE Chaplain consult to start advanced directives  Time spent during discussion: 16 minutes  Sherice Ijames, MD

## 2018-08-29 NOTE — Consult Note (Signed)
ORTHOPAEDIC CONSULTATION  REQUESTING PHYSICIAN: Adrian Saran, MD  Chief Complaint:   Status post ORIF of right supracondylar femur fracture.  History of Present Illness: Gary Thomas is a 84 y.o. male with multiple medical problems who is now 5 days status post an ORIF of a periprosthetic supracondylar right femur fracture.  The patient had been discharged to rehab on 08/26/2018, but returned to the emergency room yesterday complaining of shortness of breath.  Subsequently, the patient was admitted for respiratory management.  Already he feels much improved from a breathing standpoint.  The patient denies any problems regarding his right leg.  He denies any reinjury, and has not noticed any fevers or chills.  He denies any numbness or paresthesias down his leg to his foot.  He states that physical therapy worked with him today and he was able to rest his right foot on the ground without significant discomfort while in his brace.  Past Medical History:  Diagnosis Date  . Depression   . Diabetes mellitus without complication (HCC)   . Dyspnea   . Elevated lipids   . GERD (gastroesophageal reflux disease)   . Hypertension   . Pulmonary emboli (HCC)   . Renal insufficiency    only has 1 kidney  . Sleep apnea   . Stroke (HCC)    Rt side weakness   Past Surgical History:  Procedure Laterality Date  . 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 AORTA SURGERY    . CAROTID ARTERY - SUBCLAVIAN ARTERY BYPASS GRAFT Bilateral   . CATARACT EXTRACTION W/ INTRAOCULAR LENS  IMPLANT, BILATERAL    . CHOLECYSTECTOMY    . EYE SURGERY    . FEMUR FRACTURE SURGERY Right   . HIP FRACTURE SURGERY Bilateral   . JOINT REPLACEMENT    . ORIF FEMUR FRACTURE Right 08/24/2018   Procedure: OPEN REDUCTION INTERNAL FIXATION (ORIF) SUPRACONDYLAR FEMUR FRACTURE;  Surgeon: Christena Flake, MD;  Location: [facility] ORS;   Service: Orthopedics;  Laterality: Right;  . PROSTATE SURGERY    . REPLACEMENT TOTAL KNEE Right   . TOTAL HIP ARTHROPLASTY Right 03/02/2017   Procedure: TOTAL HIP ARTHROPLASTY ANTERIOR APPROACH;  Surgeon: Kennedy Bucker, MD;  Location: [facility] ORS;  Service: Orthopedics;  Laterality: Right;  Marland Kitchen VASCULAR SURGERY     Social History   Socioeconomic History  . Marital status: Married    Spouse name: Not on file  . Number of children: Not on file  . Years of education: Not on file  . Highest education level: Not on file  Occupational History  . Not on file  Social Needs  . Financial resource strain: Not on file  . Food insecurity:    Worry: Not on file    Inability: Not on file  . Transportation needs:    Medical: Not on file    Non-medical: Not on file  Tobacco Use  . Smoking status: Former Smoker    Packs/day: 1.00    Years: 7.00    Pack years: 7.00    Types: Cigarettes    Last attempt to quit: 02/18/1952    Years since quitting: 66.5  . Smokeless tobacco: Former Engineer, water and Sexual Activity  . Alcohol use: No  . Drug use: No  . Sexual activity: Not on file  Lifestyle  . Physical activity:    Days per week: Not on file    Minutes per session: Not on file  . Stress: Not on  file  Relationships  . Social connections:    Talks on phone: Not on file    Gets together: Not on file    Attends religious service: Not on file    Active member of club or organization: Not on file    Attends meetings of clubs or organizations: Not on file    Relationship status: Not on file  Other Topics Concern  . Not on file  Social History Narrative  . Not on file   Family History  Problem Relation Age of Onset  . Heart attack Father   . Stroke Father   . Heart disease Father   . Heart attack Mother    Allergies  Allergen Reactions  . Codeine Anaphylaxis  . Morphine Anaphylaxis  . Gabapentin Other (See Comments)    Unsure of what the reaction was   . Stadol [Butorphanol]      Unknown  . Lyrica [Pregabalin] Rash  . Tapentadol Nausea Only    ONSET 01/04/2013   Prior to Admission medications   Medication Sig Start Date End Date Taking? Authorizing Provider  atorvastatin (LIPITOR) 20 MG tablet Take 20 mg by mouth at bedtime.  02/02/17  Yes [provider]  ferrous sulfate 325 (65 FE) MG EC tablet Take 1 tablet (325 mg total) by mouth 3 (three) times daily with meals. 08/26/18  Yes Mayo, Allyn Kenner, MD  furosemide (LASIX) 40 MG tablet Take 40 mg by mouth at bedtime.  02/09/17  Yes [provider]  losartan (COZAAR) 25 MG tablet Take 25 mg by mouth at bedtime.  01/14/17  Yes [provider]  metoprolol succinate (TOPROL-XL) 100 MG 24 hr tablet Take 100 mg by mouth at bedtime.  02/12/16  Yes [provider]  NOVOLOG MIX 70/30 FLEXPEN (70-30) 100 UNIT/ML FlexPen Inject 25 Units into the skin 2 (two) times daily. 11/20/16  Yes [provider]  omeprazole (PRILOSEC) 40 MG capsule Take by mouth daily. 08/04/18  Yes [provider]  oxyCODONE (OXY IR/ROXICODONE) 5 MG immediate release tablet Take 1-2 tablets (5-10 mg total) by mouth every 4 (four) hours as needed for moderate pain (pain score 4-6). 08/26/18  Yes Sharee Holster, NP  sertraline (ZOLOFT) 100 MG tablet Take 100 mg by mouth at bedtime.  01/23/17  Yes [provider]  tiotropium (SPIRIVA HANDIHALER) 18 MCG inhalation capsule Place 1 capsule (18 mcg total) into inhaler and inhale daily. 04/07/17 08/28/18 Yes Shane Crutch, MD  traMADol (ULTRAM) 50 MG tablet Take 1 tablet (50 mg total) by mouth every 6 (six) hours as needed for moderate pain. 08/26/18  Yes Dedra Skeens, PA-C  XARELTO 15 MG TABS tablet Take 15 mg by mouth at bedtime.  07/18/18  Yes [provider]   Dg Chest 1 View  Result Date: 08/28/2018 CLINICAL DATA:  Shortness of breath. EXAM: CHEST  1 VIEW COMPARISON:  08/24/2018. FINDINGS: Stable elevation of the right hemidiaphragm with mild  adjacent atelectasis. Otherwise, clear lungs with normal vascularity. Normal sized heart. Diffuse osteopenia. Left neck surgical clips and probable surgical clips at the medial right lung apex without significant change. Unremarkable bones. IMPRESSION: No acute abnormality. Electronically Signed   By: Beckie Salts M.D.   On: 08/28/2018 10:41   Dg Foot Complete Left  Result Date: 08/28/2018 CLINICAL DATA:  Left foot pain since a fall 1 week ago. EXAM: LEFT FOOT - COMPLETE 3+ VIEW COMPARISON:  Left ankle dated 08/22/2018. FINDINGS: Old, healed 1st metatarsal fracture. Mild calcaneal spur  formation. No acute fracture or dislocation seen. Mild dorsal tarsal spur formation. Diffuse arterial calcifications. IMPRESSION: No acute fracture or dislocation. Electronically Signed   By: Beckie SaltsSteven  Reid M.D.   On: 08/28/2018 10:42    Positive ROS: All other systems have been reviewed and were otherwise negative with the exception of those mentioned in the HPI and as above.  Physical Exam: General:  Alert, no acute distress Psychiatric:  Patient is competent for consent with normal mood and affect   Cardiovascular:  No pedal edema Respiratory:  No wheezing, non-labored breathing GI:  Abdomen is soft and non-tender Skin:  No lesions in the area of chief complaint Neurologic:  Sensation intact distally Lymphatic:  No axillary or cervical lymphadenopathy  Orthopedic Exam:  Orthopedic examination is limited to the right lower extremity.  The patient's surgical incisions are healing well and without evidence for infection.  He still has mild-moderate swelling around the knee, but there is no erythema or excessive warmth.  There is no drainage on the dressing.  He is able to dorsiflex and plantarflex his toes and ankle, and sensation is intact to light touch to all distributions.  He has good capillary refill to his right foot.  X-rays:  No new x-rays have been obtained since surgery last  week.  Assessment: Status post ORIF of essentially nondisplaced supracondylar right femur fracture status post prior total knee arthroplasty.  Plan: The treatment options have been reviewed with the patient.  At this point, the fracture appears to be healing well and without any complications.  His staples will need to be removed 10 to 12 days after his surgery.  He may continue to be mobilized with physical therapy nonweightbearing on the right lower extremity, and will need to continue wearing his hinged knee brace locked in extension.  Physical therapy needs to remember that he has persistent weakness in his left lower extremity resulting from his recent lumbar fusion surgery.  Perhaps a neurosurgical consult to provide additional insight as to the etiology of his residual left lower extremity weakness might be of benefit.  His prior lumbar surgery apparently was performed by [place] Dr. [place].  Thank you for asking me to participate in the care of this most pleasant man.  I will be happy to follow him with you.   Maryagnes AmosJ. Jeffrey , MD  Beeper #:  [phone]  08/29/2018 6:09 PM

## 2018-08-29 NOTE — Progress Notes (Signed)
[facility] [place] [facility]   PATIENT NAME: Gary KennedyDanford Thomas    MR#:  [id number]  DATE OF BIRTH:  1932/01/15  SUBJECTIVE:   Patient presented from skilled nursing facility due to shortness of breath.  Patient continues to have shortness of breath and wheezing  REVIEW OF SYSTEMS:    Review of Systems  Constitutional: Negative for fever, chills weight loss HENT: Negative for ear pain, nosebleeds, congestion, facial swelling, rhinorrhea, neck pain, neck stiffness and ear discharge.   Respiratory:B Cardiovascular: Negative for chest pain, palpitations and leg swelling.  Gastrointestinal: Negative for heartburn, abdominal pain, vomiting, diarrhea or consitpation Genitourinary: Negative for dysuria, urgency, frequency, hematuria Musculoskeletal: Negative for back pain or joint pain Neurological: Negative for dizziness, seizures, syncope, focal weakness,  numbness and headaches.  Hematological: Does not bruise/bleed easily.  Psychiatric/Behavioral: Negative for hallucinations, confusion, dysphoric mood    Tolerating Diet: yes      DRUG ALLERGIES:   Allergies  Allergen Reactions  . Codeine Anaphylaxis  . Morphine Anaphylaxis  . Gabapentin Other (See Comments)    Unsure of what the reaction was   . Stadol [Butorphanol]     Unknown  . Lyrica [Pregabalin] Rash  . Tapentadol Nausea Only    ONSET 01/04/2013    VITALS:  Blood pressure (!) 152/68, pulse 76, temperature 97.6 F (36.4 C), temperature source Axillary, resp. rate (!) 21, height 5\' 10"  (1.778 m), weight 99.8 kg, SpO2 99 %.  PHYSICAL EXAMINATION:  Constitutional: Appears well-developed and well-nourished. No distress. HENT: Normocephalic. Marland Kitchen. Oropharynx is clear and moist.  Eyes: Conjunctivae and EOM are normal. PERRLA, no scleral icterus.  Neck: Normal ROM. Neck supple. No JVD. No tracheal deviation. CVS: RRR, S1/S2 +, no murmurs, no gallops, no carotid bruit.  Pulmonary: Normal respiratory  effort with bilateral wheezing abdominal: Soft. BS +,  no distension, tenderness, rebound or guarding.  Musculoskeletal: Leg brace in place. No edema and no tenderness.  Neuro: Alert. CN 2-12 grossly intact. No focal deficits. Skin: Skin is warm and dry. No rash noted. Psychiatric: Normal mood and affect.      LABORATORY PANEL:   CBC Recent Labs  Lab 08/29/18 0317  WBC 12.3*  HGB 7.9*  HCT 27.1*  PLT 183   ------------------------------------------------------------------------------------------------------------------  Chemistries  Recent Labs  Lab 08/28/18 0944 08/29/18 0317  NA 141 141  K 4.4 4.5  CL 106 109  CO2 26 28  GLUCOSE 207* 211*  BUN 32* 45*  CREATININE 1.73* 1.78*  CALCIUM 8.7* 8.6*  AST 55*  --   ALT 22  --   ALKPHOS 74  --   BILITOT 0.7  --    ------------------------------------------------------------------------------------------------------------------  Cardiac Enzymes Recent Labs  Lab 08/28/18 0944  TROPONINI 0.03*   ------------------------------------------------------------------------------------------------------------------  RADIOLOGY:  Dg Chest 1 View  Result Date: 08/28/2018 CLINICAL DATA:  Shortness of breath. EXAM: CHEST  1 VIEW COMPARISON:  08/24/2018. FINDINGS: Stable elevation of the right hemidiaphragm with mild adjacent atelectasis. Otherwise, clear lungs with normal vascularity. Normal sized heart. Diffuse osteopenia. Left neck surgical clips and probable surgical clips at the medial right lung apex without significant change. Unremarkable bones. IMPRESSION: No acute abnormality. Electronically Signed   By: Beckie SaltsSteven  Reid M.D.   On: 08/28/2018 10:41   Dg Foot Complete Left  Result Date: 08/28/2018 CLINICAL DATA:  Left foot pain since a fall 1 week ago. EXAM: LEFT FOOT - COMPLETE 3+ VIEW COMPARISON:  Left ankle dated 08/22/2018. FINDINGS: Old, healed 1st metatarsal fracture. Mild  calcaneal spur formation. No acute fracture  or dislocation seen. Mild dorsal tarsal spur formation. Diffuse arterial calcifications. IMPRESSION: No acute fracture or dislocation. Electronically Signed   By: Beckie Salts M.D.   On: 08/28/2018 10:42     ASSESSMENT AND PLAN:   84 year old male with history of OSA not compliant with CPAP, diabetes, COPD and recent admission for right periprosthetic femur fracture status post ORIF who presented with shortness of breath.  1.  Acute hypoxic respiratory failure due to mild COPD exacerbation with acute bronchitis and underlying sleep apnea: Patient has been weaned off BiPAP and now on nasal cannula. Wean oxygen as tolerated  2.  Acute COPD exacerbation with acute bronchitis: Decrease IV steroids to 40 mg daily Continue nebs and inhalers. Azithromycin for acute bronchitis  3.  Recent right hip periprosthetic fracture status post ORIF: Orthopedic surgery consultation requested PT consultation requested  4.  Diabetes: Continue ADA diet with outpatient regimen and sliding scale 5.  Essential hypertension: Continue Toprol and losartan  6.  History of PE on Xarelto  7.  Chronic kidney disease stage III: Creatinine is at baseline  8.  Depression: Continue Zoloft       Management plans discussed with the patient and wife and they are in agreement.  CODE STATUS: Full  TOTAL TIME TAKING CARE OF THIS PATIENT: 30 minutes.     POSSIBLE D/C tomorrow to skilled nursing facility, DEPENDING ON CLINICAL CONDITION.   Tajuanna Burnett M.D on 08/29/2018 at 10:58 AM  Between 7am to 6pm - Pager - [phone] After 6pm go to [email or web address] - password [email or web address]  [facility]  Office  [phone]  CC: Primary care physician; Marguarite Arbour, MD  Note: This dictation was prepared with Dragon dictation along with smaller phrase technology. Any transcriptional errors that result from this process are unintentional.

## 2018-08-29 NOTE — Progress Notes (Signed)
Chaplain responded to an OR for an AD. Family is at the bedside. Chaplain questioned if Pt desired information. Pt "save your breath. Im not interested.    08/29/18 1000  Clinical Encounter Type  Visited With Patient and family together  Visit Type Initial  Referral From Physician  Spiritual Encounters  Spiritual Needs Brochure

## 2018-08-29 NOTE — Care Management (Signed)
Per notes, [facility]. CSW following.  Please consult RNCM if needed.

## 2018-08-30 LAB — BASIC METABOLIC PANEL
Anion gap: 5 (ref 5–15)
BUN: 63 mg/dL — AB (ref 8–23)
CALCIUM: 8.7 mg/dL — AB (ref 8.9–10.3)
CO2: 28 mmol/L (ref 22–32)
CREATININE: 1.73 mg/dL — AB (ref 0.61–1.24)
Chloride: 106 mmol/L (ref 98–111)
GFR calc Af Amer: 40 mL/min — ABNORMAL LOW (ref >60)
GFR calc non Af Amer: 34 mL/min — ABNORMAL LOW (ref >60)
GLUCOSE: 159 mg/dL — AB (ref 70–99)
Potassium: 4.5 mmol/L (ref 3.5–5.1)
Sodium: 139 mmol/L (ref 135–145)

## 2018-08-30 LAB — GLUCOSE, CAPILLARY
Glucose-Capillary: 114 mg/dL — ABNORMAL HIGH (ref 70–99)
Glucose-Capillary: 149 mg/dL — ABNORMAL HIGH (ref 70–99)

## 2018-08-30 MED ORDER — IPRATROPIUM-ALBUTEROL 0.5-2.5 (3) MG/3ML IN SOLN: 3.0000 mL | Freq: Two times a day (BID) | RESPIRATORY_TRACT | Status: DC

## 2018-08-30 MED ORDER — OXYCODONE HCL 5 MG PO TABS: 5.0000 mg | ORAL_TABLET | ORAL | 0 refills | Status: DC | PRN

## 2018-08-30 MED ORDER — INSULIN ASPART 100 UNIT/ML ~~LOC~~ SOLN: 0.0000 [IU] | Freq: Every day | SUBCUTANEOUS | 11 refills | Status: DC

## 2018-08-30 MED ORDER — ALBUTEROL SULFATE (2.5 MG/3ML) 0.083% IN NEBU: 2.5000 mg | INHALATION_SOLUTION | RESPIRATORY_TRACT | 12 refills | Status: DC | PRN

## 2018-08-30 MED ORDER — TRAMADOL HCL 50 MG PO TABS: 50.0000 mg | ORAL_TABLET | Freq: Four times a day (QID) | ORAL | 0 refills | Status: DC | PRN

## 2018-08-30 MED ORDER — ALLOPURINOL 100 MG PO TABS: 50.0000 mg | ORAL_TABLET | ORAL | Status: DC

## 2018-08-30 MED ORDER — PREDNISONE 10 MG (21) PO TBPK: 10.0000 mg | ORAL_TABLET | Freq: Every day | ORAL | 0 refills | Status: DC

## 2018-08-30 MED ORDER — INSULIN ASPART 100 UNIT/ML ~~LOC~~ SOLN: 0.0000 [IU] | Freq: Three times a day (TID) | SUBCUTANEOUS | 11 refills | Status: DC

## 2018-08-30 MED ORDER — ALLOPURINOL 100 MG PO TABS
50.0000 mg | ORAL_TABLET | ORAL | Status: DC
  Administered 2018-08-30: 50 mg via ORAL
  Filled 2018-08-30: qty 0.5

## 2018-08-30 MED ORDER — ACETAMINOPHEN 325 MG PO TABS: 650.0000 mg | ORAL_TABLET | Freq: Four times a day (QID) | ORAL | Status: DC | PRN

## 2018-08-30 MED ORDER — ALBUTEROL SULFATE (2.5 MG/3ML) 0.083% IN NEBU: 2.5000 mg | INHALATION_SOLUTION | RESPIRATORY_TRACT | Status: DC | PRN

## 2018-08-30 MED ORDER — BUDESONIDE 0.5 MG/2ML IN SUSP: 0.5000 mg | Freq: Two times a day (BID) | RESPIRATORY_TRACT | 12 refills | Status: DC

## 2018-08-30 MED ORDER — AZITHROMYCIN 500 MG PO TABS: 500.0000 mg | ORAL_TABLET | Freq: Every day | ORAL | 0 refills | Status: AC

## 2018-08-30 NOTE — Progress Notes (Addendum)
Patient is medically stable for D/C [facility]. [facility] today to room 209-A. RN will call report at [phone] and arrange EMS for transport. Clinical Child psychotherapistocial Worker (CSW) sent D/C orders to [facility] via [employer]. Patient is aware of above. CSW left patient's wife [name] a Engineer, technical salesvoicemail. Please reconsult if future social work needs arise. CSW signing off.   Patient's wife [name] called CSW back and was made aware of D/C today.   Gary [employer]Bailey Yanky [place], LCSW [phone]

## 2018-08-30 NOTE — Progress Notes (Signed)
Report called to Melissa @ [facility], EMS called for transportation. 

## 2018-08-30 NOTE — Discharge Summary (Signed)
[facility] [place] at [facility]   PATIENT NAME: Gary Thomas    MR#:  [id number]  DATE OF BIRTH:  [birth year]  DATE OF ADMISSION:  08/28/2018 ADMITTING PHYSICIAN: Houston Siren, MD  DATE OF DISCHARGE: 08/30/18  PRIMARY CARE PHYSICIAN: Marguarite Arbour, MD    ADMISSION DIAGNOSIS:  Hypoxia [R09.02] COPD exacerbation (HCC) [J44.1]  DISCHARGE DIAGNOSIS:  Active Problems:   Acute respiratory failure with hypoxia (HCC)  Status post ORIF of right supracondylar femur fracture SECONDARY DIAGNOSIS:   Past Medical History:  Diagnosis Date  . Depression   . Diabetes mellitus without complication (HCC)   . Dyspnea   . Elevated lipids   . GERD (gastroesophageal reflux disease)   . Hypertension   . Pulmonary emboli (HCC)   . Renal insufficiency    only has 1 kidney  . Sleep apnea   . Stroke (HCC)    Rt side weakness    HOSPITAL COURSE:   84 year old male with history of OSA not compliant with CPAP, diabetes, COPD and recent admission for right periprosthetic femur fracture status post ORIF who presented with shortness of breath.  1.  Acute hypoxic respiratory failure due to mild COPD exacerbation with acute bronchitis and underlying sleep apnea: Patient has been weaned off BiPAP and now on nasal cannula. Wean oxygen as tolerated  2.  Acute COPD exacerbation with acute bronchitis: Taper  IV steroids to PO steroids Continue nebs and inhalers. Azithromycin course for acute bronchitis  3.  Recent right hip periprosthetic fracture status post ORIF: Orthopedic surgery dr.Poggi has evaluated pt. staples will need to be removed 10 to 12 days after his surgery.  He may continue to be mobilized with physical therapy nonweightbearing on the right lower extremity, and will need to continue wearing his hinged knee brace locked in extension.  Physical therapy needs to remember that he has persistent weakness in his left lower extremity resulting from his  recent lumbar fusion surgery.   Recommending outpatient neurosurgical consult to provide additional insight as to the etiology of his residual left lower extremity weakness might be of benefit.  Primary care physician can refer the patient to outpatient neurosurgery His prior lumbar surgery apparently was performed by a Dr. Venetia Maxon in [place]. PT -recommending skilled nursing facility we will discharge him back to [facility]  4.  Diabetes: Continue ADA diet with outpatient regimen and sliding scale 5.  Essential hypertension: Continue Toprol and losartan  6.  History of PE on Xarelto  7.  Chronic kidney disease stage III: Creatinine is at baseline  8.  Depression: Continue Zoloft  9.  Right foot gout flare P.o. steroids and allopurinol is added to the regimen  DISCHARGE CONDITIONS:   fair  CONSULTS OBTAINED:  Treatment Team:  Christena Flake, MD   PROCEDURES   DRUG ALLERGIES:   Allergies  Allergen Reactions  . Codeine Anaphylaxis  . Morphine Anaphylaxis  . Gabapentin Other (See Comments)    Unsure of what the reaction was   . Stadol [Butorphanol]     Unknown  . Lyrica [Pregabalin] Rash  . Tapentadol Nausea Only    ONSET 01/04/2013    DISCHARGE MEDICATIONS:   Allergies as of 08/30/2018      Reactions   Codeine Anaphylaxis   Morphine Anaphylaxis   Gabapentin Other (See Comments)   Unsure of what the reaction was   Stadol [butorphanol]    Unknown   Lyrica [pregabalin] Rash   Tapentadol Nausea Only  ONSET 01/04/2013      Medication List    TAKE these medications   acetaminophen 325 MG tablet Commonly known as:  TYLENOL Take 2 tablets (650 mg total) by mouth every 6 (six) hours as needed for mild pain (or Fever >/= 101).   albuterol (2.5 MG/3ML) 0.083% nebulizer solution Commonly known as:  PROVENTIL Take 3 mLs (2.5 mg total) by nebulization every 4 (four) hours as needed for wheezing or shortness of breath.   allopurinol 100 MG tablet Commonly known  as:  ZYLOPRIM Take 0.5 tablets (50 mg total) by mouth every other day.   atorvastatin 20 MG tablet Commonly known as:  LIPITOR Take 20 mg by mouth at bedtime.   azithromycin 500 MG tablet Commonly known as:  ZITHROMAX Take 1 tablet (500 mg total) by mouth daily for 2 days. Start taking on:  08/31/2018   budesonide 0.5 MG/2ML nebulizer solution Commonly known as:  PULMICORT Take 2 mLs (0.5 mg total) by nebulization 2 (two) times daily.   ferrous sulfate 325 (65 FE) MG EC tablet Take 1 tablet (325 mg total) by mouth 3 (three) times daily with meals.   furosemide 40 MG tablet Commonly known as:  LASIX Take 40 mg by mouth at bedtime.   insulin aspart 100 UNIT/ML injection Commonly known as:  novoLOG Inject 0-9 Units into the skin 3 (three) times daily with meals. -9 Units, Subcutaneous, 3 times daily with meals, First dose on Sun 08/28/18 at 1200 Correction coverage: Sensitive (thin, NPO, renal) CBG < 70: implement hypoglycemia protocol CBG 70 - 120: 0 units CBG 121 - 150: 1 unit CBG 151 - 200: 2 units CBG 201 - 250: 3 units CBG 251 - 300: 5 units CBG 301 - 350: 7 units CBG 351 - 400: 9 units CBG > 400: call MD and obtain STAT lab verification   insulin aspart 100 UNIT/ML injection Commonly known as:  novoLOG Inject 0-5 Units into the skin at bedtime. Correction coverage: HS scale CBG < 70: implement hypoglycemia protocol CBG 70 - 120: 0 units CBG 121 - 150: 0 units CBG 151 - 200: 0 units CBG 201 - 250: 2 units CBG 251 - 300: 3 units CBG 301 - 350: 4 units CBG 351 - 400: 5 units CBG > 400: call MD and obtain STAT lab verification   ipratropium-albuterol 0.5-2.5 (3) MG/3ML Soln Commonly known as:  DUONEB Take 3 mLs by nebulization 2 (two) times daily.   losartan 25 MG tablet Commonly known as:  COZAAR Take 25 mg by mouth at bedtime.   metoprolol succinate 100 MG 24 hr tablet Commonly known as:  TOPROL-XL Take 100 mg by mouth at bedtime.   NOVOLOG MIX 70/30  FLEXPEN (70-30) 100 UNIT/ML FlexPen Generic drug:  insulin aspart protamine - aspart Inject 25 Units into the skin 2 (two) times daily.   omeprazole 40 MG capsule Commonly known as:  PRILOSEC Take by mouth daily.   oxyCODONE 5 MG immediate release tablet Commonly known as:  Oxy IR/ROXICODONE Take 1-2 tablets (5-10 mg total) by mouth every 4 (four) hours as needed for moderate pain (pain score 4-6).   predniSONE 10 MG (21) Tbpk tablet Commonly known as:  STERAPRED UNI-PAK 21 TAB Take 1 tablet (10 mg total) by mouth daily. Take 6 tablets by mouth for 1 day followed by  5 tablets by mouth for 1 day followed by  4 tablets by mouth for 1 day followed by  3 tablets by mouth for 1  day followed by  2 tablets by mouth for 1 day followed by  1 tablet by mouth for a day and stop   sertraline 100 MG tablet Commonly known as:  ZOLOFT Take 100 mg by mouth at bedtime.   tiotropium 18 MCG inhalation capsule Commonly known as:  SPIRIVA Place 1 capsule (18 mcg total) into inhaler and inhale daily.   traMADol 50 MG tablet Commonly known as:  ULTRAM Take 1 tablet (50 mg total) by mouth every 6 (six) hours as needed for moderate pain.   XARELTO 15 MG Tabs tablet Generic drug:  Rivaroxaban Take 15 mg by mouth at bedtime.        DISCHARGE INSTRUCTIONS:   Follow-up with primary care physician at the facility in 3 to 4 days Follow-up with neurosurgery in 1 to 2 weeks.  Primary care physician has to refer Follow-up with Dr. Salomon Mast in 10 days  DIET:  Cardiac diet and Diabetic diet  DISCHARGE CONDITION:  Fair  ACTIVITY:  Activity as tolerated , see ortho instructions in the progress note  OXYGEN:  Home Oxygen: No.   Oxygen Delivery: room air  DISCHARGE LOCATION:  nursing home   If you experience worsening of your admission symptoms, develop shortness of breath, life threatening emergency, suicidal or homicidal thoughts you must seek medical attention immediately by calling 911 or  calling your MD immediately  if symptoms less severe.  You Must read complete instructions/literature along with all the possible adverse reactions/side effects for all the Medicines you take and that have been prescribed to you. Take any new Medicines after you have completely understood and accpet all the possible adverse reactions/side effects.   Please note  You were cared for by a hospitalist during your hospital stay. If you have any questions about your discharge medications or the care you received while you were in the hospital after you are discharged, you can call the unit and asked to speak with the hospitalist on call if the hospitalist that took care of you is not available. Once you are discharged, your primary care physician will handle any further medical issues. Please note that NO REFILLS for any discharge medications will be authorized once you are discharged, as it is imperative that you return to your primary care physician (or establish a relationship with a primary care physician if you do not have one) for your aftercare needs so that they can reassess your need for medications and monitor your lab values.     Today  Chief Complaint  Patient presents with  . Respiratory Distress   Patient is resting comfortably.  Reporting right foot pain from the gout but steroids and pain medications are helping  ROS:  CONSTITUTIONAL: Denies fevers, chills. Denies any fatigue, weakness.  EYES: Denies blurry vision, double vision, eye pain. EARS, NOSE, THROAT: Denies tinnitus, ear pain, hearing loss. RESPIRATORY: Denies cough, wheeze, shortness of breath.  CARDIOVASCULAR: Denies chest pain, palpitations, edema.  GASTROINTESTINAL: Denies nausea, vomiting, diarrhea, abdominal pain. Denies bright red blood per rectum. GENITOURINARY: Denies dysuria, hematuria. ENDOCRINE: Denies nocturia or thyroid problems. HEMATOLOGIC AND LYMPHATIC: Denies easy bruising or bleeding. SKIN: Denies  rash or lesion. MUSCULOSKELETAL: Denies pain in neck, back, shoulder, reporting pain from gout in the right great toe right knee pain is manageable.  NEUROLOGIC: Denies paralysis, paresthesias.  PSYCHIATRIC: Denies anxiety or depressive symptoms.   VITAL SIGNS:  Blood pressure (!) 146/60, pulse 67, temperature 97.7 F (36.5 C), resp. rate 18, height 5\' 10"  (1.778  m), weight 99.8 kg, SpO2 93 %.  I/O:    Intake/Output Summary (Last 24 hours) at 08/30/2018 1059 Last data filed at 08/30/2018 0900 Gross per 24 hour  Intake 480 ml  Output 1100 ml  Net -620 ml    PHYSICAL EXAMINATION:  GENERAL:  84 y.o.-year-old patient lying in the bed with no acute distress.  EYES: Pupils equal, round, reactive to light and accommodation. No scleral icterus. Extraocular muscles intact.  HEENT: Head atraumatic, normocephalic. Oropharynx and nasopharynx clear.  NECK:  Supple, no jugular venous distention. No thyroid enlargement, no tenderness.  LUNGS: Normal breath sounds bilaterally, no wheezing, rales,rhonchi or crepitation. No use of accessory muscles of respiration.  CARDIOVASCULAR: S1, S2 normal. No murmurs, rubs, or gallops.  ABDOMEN: Soft, non-tender, non-distended. Bowel sounds present. No organomegaly or mass.  EXTREMITIES: Right lower extremity in the brace, right great toe slightly erythematous and tender to touch no pedal edema, cyanosis, or clubbing.  NEUROLOGIC: Awake, alert and oriented x3 gait not checked.  PSYCHIATRIC: The patient is alert and oriented x 3.  SKIN: No obvious rash, lesion, or ulcer.   DATA REVIEW:   CBC Recent Labs  Lab 08/29/18 0317  WBC 12.3*  HGB 7.9*  HCT 27.1*  PLT 183    Chemistries  Recent Labs  Lab 08/28/18 0944  08/30/18 0325  NA 141   < > 139  K 4.4   < > 4.5  CL 106   < > 106  CO2 26   < > 28  GLUCOSE 207*   < > 159*  BUN 32*   < > 63*  CREATININE 1.73*   < > 1.73*  CALCIUM 8.7*   < > 8.7*  AST 55*  --   --   ALT 22  --   --   ALKPHOS  74  --   --   BILITOT 0.7  --   --    < > = values in this interval not displayed.    Cardiac Enzymes Recent Labs  Lab 08/28/18 0944  TROPONINI 0.03*    Microbiology Results  Results for orders placed or performed during the hospital encounter of 08/22/18  MRSA PCR Screening     Status: None   Collection Time: 08/23/18  2:13 AM  Result Value Ref Range Status   MRSA by PCR NEGATIVE NEGATIVE Final    Comment:        The GeneXpert MRSA Assay (FDA approved for NASAL specimens only), is one component of a comprehensive MRSA colonization surveillance program. It is not intended to diagnose MRSA infection nor to guide or monitor treatment for MRSA infections. Performed at [facility], [address], [place], [place] [address]     RADIOLOGY:  Dg Chest 1 View  Result Date: 08/28/2018 CLINICAL DATA:  Shortness of breath. EXAM: CHEST  1 VIEW COMPARISON:  08/24/2018. FINDINGS: Stable elevation of the right hemidiaphragm with mild adjacent atelectasis. Otherwise, clear lungs with normal vascularity. Normal sized heart. Diffuse osteopenia. Left neck surgical clips and probable surgical clips at the medial right lung apex without significant change. Unremarkable bones. IMPRESSION: No acute abnormality. Electronically Signed   By: Beckie Salts M.D.   On: 08/28/2018 10:41   Dg Foot Complete Left  Result Date: 08/28/2018 CLINICAL DATA:  Left foot pain since a fall 1 week ago. EXAM: LEFT FOOT - COMPLETE 3+ VIEW COMPARISON:  Left ankle dated 08/22/2018. FINDINGS: Old, healed 1st metatarsal fracture. Mild calcaneal spur formation. No acute fracture  or dislocation seen. Mild dorsal tarsal spur formation. Diffuse arterial calcifications. IMPRESSION: No acute fracture or dislocation. Electronically Signed   By: Beckie Salts M.D.   On: 08/28/2018 10:42    EKG:   Orders placed or performed during the hospital encounter of 08/28/18  . ED EKG  . ED EKG  . EKG 12-Lead  . EKG 12-Lead   . EKG 12-Lead  . EKG 12-Lead      Management plans discussed with the patient, family and they are in agreement.  CODE STATUS:     Code Status Orders  (From admission, onward)         Start     Ordered   08/28/18 1335  Full code  Continuous     08/28/18 1334        Code Status History    Date Active Date Inactive Code Status Order ID Comments User Context   08/23/2018 0125 08/26/2018 2201 Full Code [id number]  Cammy Copa, MD Inpatient   02/15/2018 2012 02/21/2018 1700 Full Code [id number]  Maeola Harman, MD Inpatient   03/02/2017 1938 03/05/2017 1439 Full Code [id number]  Kennedy Bucker, MD Inpatient      TOTAL TIME TAKING CARE OF THIS PATIENT: 43 minutes.   Note: This dictation was prepared with Dragon dictation along with smaller phrase technology. Any transcriptional errors that result from this process are unintentional.   @[facility] @  on 08/30/2018 at 10:59 AM  Between 7am to 6pm - Pager - [phone]  After 6pm go to [email or web address] - password [email or web address] [facility]  [facility]  Office  [phone]  CC: Primary care physician; Marguarite Arbour, MD

## 2018-08-30 NOTE — Discharge Instructions (Signed)
Follow-up with primary care physician at the facility in 3 to 4 days Follow-up with neurosurgery in 1 to 2 weeks.  Primary care physician has to refer Follow-up with Dr. Salomon MastPoggie in 10 days

## 2018-09-01 ENCOUNTER — Non-Acute Institutional Stay (SKILLED_NURSING_FACILITY): Payer: Medicare Other | Admitting: Adult Health

## 2018-09-01 ENCOUNTER — Encounter: Payer: Self-pay | Admitting: Adult Health

## 2018-09-01 DIAGNOSIS — I2782 Chronic pulmonary embolism: Secondary | ICD-10-CM | POA: Diagnosis not present

## 2018-09-01 DIAGNOSIS — M1A30X0 Chronic gout due to renal impairment, unspecified site, without tophus (tophi): Secondary | ICD-10-CM

## 2018-09-01 DIAGNOSIS — Z96651 Presence of right artificial knee joint: Secondary | ICD-10-CM

## 2018-09-01 DIAGNOSIS — I739 Peripheral vascular disease, unspecified: Secondary | ICD-10-CM | POA: Diagnosis not present

## 2018-09-01 DIAGNOSIS — K219 Gastro-esophageal reflux disease without esophagitis: Secondary | ICD-10-CM

## 2018-09-01 DIAGNOSIS — M1711 Unilateral primary osteoarthritis, right knee: Secondary | ICD-10-CM | POA: Diagnosis not present

## 2018-09-01 DIAGNOSIS — E785 Hyperlipidemia, unspecified: Secondary | ICD-10-CM

## 2018-09-01 DIAGNOSIS — R600 Localized edema: Secondary | ICD-10-CM

## 2018-09-01 DIAGNOSIS — D649 Anemia, unspecified: Secondary | ICD-10-CM

## 2018-09-01 DIAGNOSIS — F324 Major depressive disorder, single episode, in partial remission: Secondary | ICD-10-CM

## 2018-09-01 DIAGNOSIS — N183 Chronic kidney disease, stage 3 (moderate): Secondary | ICD-10-CM

## 2018-09-01 DIAGNOSIS — I6930 Unspecified sequelae of cerebral infarction: Secondary | ICD-10-CM

## 2018-09-01 DIAGNOSIS — E1122 Type 2 diabetes mellitus with diabetic chronic kidney disease: Secondary | ICD-10-CM

## 2018-09-01 DIAGNOSIS — S72454S Nondisplaced supracondylar fracture without intracondylar extension of lower end of right femur, sequela: Secondary | ICD-10-CM

## 2018-09-01 DIAGNOSIS — Z794 Long term (current) use of insulin: Secondary | ICD-10-CM

## 2018-09-01 DIAGNOSIS — E1169 Type 2 diabetes mellitus with other specified complication: Secondary | ICD-10-CM

## 2018-09-01 NOTE — Progress Notes (Signed)
[facility]   [facility] Room Number: 209 A Place of Service:  SNF (31)   CODE STATUS: Full Code  Allergies  Allergen Reactions  . Codeine Anaphylaxis  . Morphine Anaphylaxis  . Gabapentin Other (See Comments)    Unsure of what the reaction was   . Stadol [Butorphanol]     Unknown  . Lyrica [Pregabalin] Rash  . Tapentadol Nausea Only    ONSET 01/04/2013    Chief Complaint  Patient presents with  . Acute Visit    Increased confusion    HPI:  Staff reports that he has been more confused since his admission to this facility. Today his is more alert. He is able to answer questions: denies any vertigo; no uncontrolled pain; no insomnia; no cough or shortness of breath. He does have a blister on his right posterior knee.   Past Medical History:  Diagnosis Date  . Depression   . Diabetes mellitus without complication (HCC)   . Dyspnea   . Elevated lipids   . GERD (gastroesophageal reflux disease)   . Hypertension   . Pulmonary emboli (HCC)   . Renal insufficiency    only has 1 kidney  . Sleep apnea   . Stroke (HCC)    Rt side weakness    Past Surgical History:  Procedure Laterality Date  . 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 AORTA SURGERY    . CAROTID ARTERY - SUBCLAVIAN ARTERY BYPASS GRAFT Bilateral   . CATARACT EXTRACTION W/ INTRAOCULAR LENS  IMPLANT, BILATERAL    . CHOLECYSTECTOMY    . EYE SURGERY    . FEMUR FRACTURE SURGERY Right   . HIP FRACTURE SURGERY Bilateral   . JOINT REPLACEMENT    . ORIF FEMUR FRACTURE Right 08/24/2018   Procedure: OPEN REDUCTION INTERNAL FIXATION (ORIF) SUPRACONDYLAR FEMUR FRACTURE;  Surgeon: Christena Flake, MD;  Location: [facility] ORS;  Service: Orthopedics;  Laterality: Right;  . PROSTATE SURGERY    . REPLACEMENT TOTAL KNEE Right   . TOTAL HIP ARTHROPLASTY Right 03/02/2017   Procedure: TOTAL HIP ARTHROPLASTY ANTERIOR APPROACH;  Surgeon: Kennedy Bucker, MD;  Location:  [facility] ORS;  Service: Orthopedics;  Laterality: Right;  Marland Kitchen VASCULAR SURGERY      Social History   Socioeconomic History  . Marital status: Married    Spouse name: Not on file  . Number of children: Not on file  . Years of education: Not on file  . Highest education level: Not on file  Occupational History  . Not on file  Social Needs  . Financial resource strain: Not on file  . Food insecurity:    Worry: Not on file    Inability: Not on file  . Transportation needs:    Medical: Not on file    Non-medical: Not on file  Tobacco Use  . Smoking status: Former Smoker    Packs/day: 1.00    Years: 7.00    Pack years: 7.00    Types: Cigarettes    Last attempt to quit: 02/18/1952    Years since quitting: 66.5  . Smokeless tobacco: Former Engineer, water and Sexual Activity  . Alcohol use: No  . Drug use: No  . Sexual activity: Not on file  Lifestyle  . Physical activity:    Days per week: Not on file    Minutes per session: Not on file  . Stress: Not on file  Relationships  . Social connections:  Talks on phone: Not on file    Gets together: Not on file    Attends religious service: Not on file    Active member of club or organization: Not on file    Attends meetings of clubs or organizations: Not on file    Relationship status: Not on file  . Intimate partner violence:    Fear of current or ex partner: Not on file    Emotionally abused: Not on file    Physically abused: Not on file    Forced sexual activity: Not on file  Other Topics Concern  . Not on file  Social History Narrative  . Not on file   Family History  Problem Relation Age of Onset  . Heart attack Father   . Stroke Father   . Heart disease Father   . Heart attack Mother       VITAL SIGNS BP (!) 140/51   Pulse 66   Temp 97.6 F (36.4 C)   Resp 16   Ht 5\' 10"  (1.778 m)   Wt 222 lb (100.7 kg)   SpO2 90%   BMI 31.85 kg/m   Outpatient Encounter Medications as of 09/01/2018  Medication Sig    . acetaminophen (TYLENOL) 325 MG tablet Take 2 tablets (650 mg total) by mouth every 6 (six) hours as needed for mild pain (or Fever >/= 101).  Marland Kitchen albuterol (PROVENTIL) (2.5 MG/3ML) 0.083% nebulizer solution Take 3 mLs (2.5 mg total) by nebulization every 4 (four) hours as needed for wheezing or shortness of breath.  . allopurinol (ZYLOPRIM) 100 MG tablet Take 0.5 tablets (50 mg total) by mouth every other day.  Marland Kitchen atorvastatin (LIPITOR) 20 MG tablet Take 20 mg by mouth at bedtime.   Marland Kitchen azithromycin (ZITHROMAX) 500 MG tablet Take 1 tablet (500 mg total) by mouth daily for 2 days.  . budesonide (PULMICORT) 0.5 MG/2ML nebulizer solution Take 2 mLs (0.5 mg total) by nebulization 2 (two) times daily.  . Cholecalciferol 100 MCG (4000 UT) CAPS Take 1 capsule by mouth daily.  . ferrous sulfate 325 (65 FE) MG EC tablet Take 1 tablet (325 mg total) by mouth 3 (three) times daily with meals.  . furosemide (LASIX) 40 MG tablet Take 40 mg by mouth at bedtime.   . insulin aspart (NOVOLOG) 100 UNIT/ML injection Inject 0-9 Units into the skin 3 (three) times daily with meals. -9 Units, Subcutaneous, 3 times daily with meals, First dose on Sun 08/28/18 at 1200 Correction coverage: Sensitive (thin, NPO, renal) CBG < 70: implement hypoglycemia protocol CBG 70 - 120: 0 units CBG 121 - 150: 1 unit CBG 151 - 200: 2 units CBG 201 - 250: 3 units CBG 251 - 300: 5 units CBG 301 - 350: 7 units CBG 351 - 400: 9 units CBG > 400: call MD and obtain STAT lab verification  . insulin aspart (NOVOLOG) 100 UNIT/ML injection Inject 0-5 Units into the skin at bedtime. Correction coverage: HS scale CBG < 70: implement hypoglycemia protocol CBG 70 - 120: 0 units CBG 121 - 150: 0 units CBG 151 - 200: 0 units CBG 201 - 250: 2 units CBG 251 - 300: 3 units CBG 301 - 350: 4 units CBG 351 - 400: 5 units CBG > 400: call MD and obtain STAT lab verification  . ipratropium-albuterol (DUONEB) 0.5-2.5 (3) MG/3ML SOLN Take 3 mLs by  nebulization 2 (two) times daily.  Marland Kitchen losartan (COZAAR) 25 MG tablet Take 25 mg by mouth at bedtime.   Marland Kitchen  metoprolol succinate (TOPROL-XL) 100 MG 24 hr tablet Take 100 mg by mouth at bedtime.   . NON FORMULARY Diet Type: NAS. NCS  . NOVOLOG MIX 70/30 FLEXPEN (70-30) 100 UNIT/ML FlexPen Inject 25 Units into the skin 2 (two) times daily.  Marland Kitchen. omeprazole (PRILOSEC) 40 MG capsule Take 40 mg by mouth daily.   Marland Kitchen. oxyCODONE (OXY IR/ROXICODONE) 5 MG immediate release tablet Take 1-2 tablets (5-10 mg total) by mouth every 4 (four) hours as needed for moderate pain (pain score 4-6).  Marland Kitchen. predniSONE (STERAPRED UNI-PAK 21 TAB) 10 MG (21) TBPK tablet Take 1 tablet (10 mg total) by mouth daily. Take 6 tablets by mouth for 1 day followed by  5 tablets by mouth for 1 day followed by  4 tablets by mouth for 1 day followed by  3 tablets by mouth for 1 day followed by  2 tablets by mouth for 1 day followed by  1 tablet by mouth for a day and stop  . sertraline (ZOLOFT) 100 MG tablet Take 100 mg by mouth at bedtime.   Marland Kitchen. tiotropium (SPIRIVA HANDIHALER) 18 MCG inhalation capsule Place 1 capsule (18 mcg total) into inhaler and inhale daily.  . traMADol (ULTRAM) 50 MG tablet Take 1 tablet (50 mg total) by mouth every 6 (six) hours as needed for moderate pain.  Marland Kitchen. XARELTO 15 MG TABS tablet Take 15 mg by mouth at bedtime.    No facility-administered encounter medications on file as of 09/01/2018.      SIGNIFICANT DIAGNOSTIC EXAMS  LABS REVIEWED: TODAY:   04-20-18: hgb a1c 5.6 chol 146; ldl 77; trig 148; hdl 39.7  08-25-18: iron 11; tibc 292; ferritin 20  08-26-18: wbc 11.7; hgb 8.2; hct 28.1; mcv 84.4; plt 173; glucose 136; bun 38; creat 1.87; k+ 3.6; na++ 140; ca 8.5   Review of Systems  Constitutional: Negative for malaise/fatigue.  Respiratory: Negative for cough and shortness of breath.   Cardiovascular: Negative for chest pain.  Gastrointestinal: Negative for constipation and heartburn.  Musculoskeletal:  Positive for joint pain. Negative for back pain and myalgias.       Right hip pain is managed   Skin:       Has blister   Neurological: Negative for dizziness.  Psychiatric/Behavioral: The patient is not nervous/anxious.     Physical Exam  Constitutional: He appears well-developed and well-nourished. No distress.  Overweight   Neck: No thyromegaly present.  Cardiovascular: Normal rate, regular rhythm, normal heart sounds and intact distal pulses.  Pulmonary/Chest: Effort normal and breath sounds normal. No respiratory distress.  Is using 02   Abdominal: Soft. Bowel sounds are normal. He exhibits no distension. There is no tenderness.  Musculoskeletal: He exhibits no edema.  Is able to move all extremities Is status post right femur ORIF Has brace on right leg   Lymphadenopathy:    He has no cervical adenopathy.  Neurological: He is alert.  Skin: Skin is warm and dry. He is not diaphoretic.  Right hip incision line without signs of infection present Has blister intact to right posterior knee   Psychiatric: He has a normal mood and affect.    ASSESSMENT/ PLAN:  TODAY:   1. Essential hypertension: is stable b/p 140/51: will continue cozaar 25 mg daily toprol xl 100 mg daily   2. Chronic pulmonary embolism: is stable is on long term xarelto 15 mg daily   3. Primary osteoarthritis of right knee: is status post right knee replacement on 08-24-18: is stable will  continue therapy as directed and will follow up with orthopedics as directed   4. COPD with respiratory failure; acute: is stable is currently using 02. Will continue prednisone taper; will continue duoneb twice daily spiviria 18 mcg daily albuterol neb every 4 hours as needed pulmonary neb treatment twice daily   5. Dyslipidemia associated with type 2 diabetes mellitus: is stable LDL will continue lipitor 20 mg daily   6. 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 Controlled type 2  diabetes mellitus with stage 3 chronic kidney disease with long term current use of insulin: is stable hgb a1c 5.6; will continue novolog mix 70/30: 25 units twice daily and Novolog SSI.   8. PAD(peripheral artery disease) is stable will monitor  9.  Chronic cerebrovascular accident: is stable will continue xarelto 15 mg daily   10. GERD without esophagitis: is stable will continue prilosec 40 mg daily   11.  Closed nondisplaced supracondylar fracture of distal end of right femur without intracondylar extension sequela: is stable will continue therapy as directed and will follow up with orthopedics as indicated. Will continue oxycodone 5 or 10 mg every 4 hours as needed and has ultram 50 mg every 6 hours as needed.  Has blister on right posterior knee: will continue treatment as directed.   12. Acute on chronic anemia: is stable hgb 8,2 will continue iron three times daily   13. 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 Bilateral lower extremity edema: is stable will continue lasix 20 mg daily   15. Chronic gout due to renal impairment: is stable will continue allopurinol 50 mg every other day   His confusion is resolving more than likely due to surgery; will not make changes will monitor   MD is aware of resident's narcotic use and is in agreement with current plan of care. We will attempt to wean resident as apropriate   Synthia Innocent NP [facility]  Contact [phone] Monday through Friday 8am- 5pm  After hours call [phone]

## 2018-09-05 ENCOUNTER — Encounter: Payer: Self-pay | Admitting: Adult Health

## 2018-09-05 ENCOUNTER — Non-Acute Institutional Stay (SKILLED_NURSING_FACILITY): Payer: Medicare Other | Admitting: Adult Health

## 2018-09-05 DIAGNOSIS — Z794 Long term (current) use of insulin: Secondary | ICD-10-CM

## 2018-09-05 DIAGNOSIS — S72454S Nondisplaced supracondylar fracture without intracondylar extension of lower end of right femur, sequela: Secondary | ICD-10-CM

## 2018-09-05 DIAGNOSIS — N183 Chronic kidney disease, stage 3 unspecified: Secondary | ICD-10-CM

## 2018-09-05 DIAGNOSIS — M1711 Unilateral primary osteoarthritis, right knee: Secondary | ICD-10-CM

## 2018-09-05 DIAGNOSIS — E1122 Type 2 diabetes mellitus with diabetic chronic kidney disease: Secondary | ICD-10-CM | POA: Diagnosis not present

## 2018-09-05 NOTE — Progress Notes (Signed)
[facility]:   [facility] Room Number: 209 A Place of Service:  SNF (31)   CODE STATUS: Full Code  Allergies  Allergen Reactions  . Codeine Anaphylaxis  . Morphine Anaphylaxis  . Gabapentin Other (See Comments)    Unsure of what the reaction was   . Stadol [Butorphanol]     Unknown  . Lyrica [Pregabalin] Rash  . Tapentadol Nausea Only    ONSET 01/04/2013    Chief Complaint  Patient presents with  . Acute Visit    Care Plan Meeting    HPI:  We have come together for his routine care plan meeting. He has lost some weight at 203 pounds. He denies any uncontrolled pain; no cough or shortness of breath; no changes in appetite. He will need hospital bed upon discharge due to his copd and closed femur fracture and right knee replacement. He will need the hospital bed for pain management; shortness of breath management. He will need a standard wheelchair. His cbg readings are variable.    Past Medical History:  Diagnosis Date  . Depression   . Diabetes mellitus without complication (HCC)   . Dyspnea   . Elevated lipids   . GERD (gastroesophageal reflux disease)   . Hypertension   . Pulmonary emboli (HCC)   . Renal insufficiency    only has 1 kidney  . Sleep apnea   . Stroke (HCC)    Rt side weakness    Past Surgical History:  Procedure Laterality Date  . 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 AORTA SURGERY    . CAROTID ARTERY - SUBCLAVIAN ARTERY BYPASS GRAFT Bilateral   . CATARACT EXTRACTION W/ INTRAOCULAR LENS  IMPLANT, BILATERAL    . CHOLECYSTECTOMY    . EYE SURGERY    . FEMUR FRACTURE SURGERY Right   . HIP FRACTURE SURGERY Bilateral   . JOINT REPLACEMENT    . ORIF FEMUR FRACTURE Right 08/24/2018   Procedure: OPEN REDUCTION INTERNAL FIXATION (ORIF) SUPRACONDYLAR FEMUR FRACTURE;  Surgeon: Christena Flake, MD;  Location: [facility] ORS;  Service: Orthopedics;  Laterality: Right;  . PROSTATE SURGERY    .  REPLACEMENT TOTAL KNEE Right   . TOTAL HIP ARTHROPLASTY Right 03/02/2017   Procedure: TOTAL HIP ARTHROPLASTY ANTERIOR APPROACH;  Surgeon: Kennedy Bucker, MD;  Location: [facility] ORS;  Service: Orthopedics;  Laterality: Right;  Marland Kitchen VASCULAR SURGERY      Social History   Socioeconomic History  . Marital status: Married    Spouse name: Not on file  . Number of children: Not on file  . Years of education: Not on file  . Highest education level: Not on file  Occupational History  . Not on file  Social Needs  . Financial resource strain: Not on file  . Food insecurity:    Worry: Not on file    Inability: Not on file  . Transportation needs:    Medical: Not on file    Non-medical: Not on file  Tobacco Use  . Smoking status: Former Smoker    Packs/day: 1.00    Years: 7.00    Pack years: 7.00    Types: Cigarettes    Last attempt to quit: 02/18/1952    Years since quitting: 66.5  . Smokeless tobacco: Former Engineer, water and Sexual Activity  . Alcohol use: No  . Drug use: No  . Sexual activity: Not on file  Lifestyle  . Physical activity:  Days per week: Not on file    Minutes per session: Not on file  . Stress: Not on file  Relationships  . Social connections:    Talks on phone: Not on file    Gets together: Not on file    Attends religious service: Not on file    Active member of club or organization: Not on file    Attends meetings of clubs or organizations: Not on file    Relationship status: Not on file  . Intimate partner violence:    Fear of current or ex partner: Not on file    Emotionally abused: Not on file    Physically abused: Not on file    Forced sexual activity: Not on file  Other Topics Concern  . Not on file  Social History Narrative  . Not on file   Family History  Problem Relation Age of Onset  . Heart attack Father   . Stroke Father   . Heart disease Father   . Heart attack Mother       VITAL SIGNS BP (!) 127/58   Pulse 82   Temp 97.9 F  (36.6 C)   Resp (!) 21   Ht 5\' 10"  (1.778 m)   Wt 222 lb (100.7 kg)   SpO2 98%   BMI 31.85 kg/m   Outpatient Encounter Medications as of 09/05/2018  Medication Sig  . acetaminophen (TYLENOL) 325 MG tablet Take 2 tablets (650 mg total) by mouth every 6 (six) hours as needed for mild pain (or Fever >/= 101).  Marland Kitchen. albuterol (PROVENTIL) (2.5 MG/3ML) 0.083% nebulizer solution Take 3 mLs (2.5 mg total) by nebulization every 4 (four) hours as needed for wheezing or shortness of breath.  . allopurinol (ZYLOPRIM) 100 MG tablet Take 0.5 tablets (50 mg total) by mouth every other day.  Marland Kitchen. atorvastatin (LIPITOR) 20 MG tablet Take 20 mg by mouth at bedtime.   . budesonide (PULMICORT) 0.5 MG/2ML nebulizer solution Take 2 mLs (0.5 mg total) by nebulization 2 (two) times daily.  . Cholecalciferol 100 MCG (4000 UT) CAPS Take 1 capsule by mouth daily.  . ferrous sulfate 325 (65 FE) MG EC tablet Take 1 tablet (325 mg total) by mouth 3 (three) times daily with meals.  . furosemide (LASIX) 40 MG tablet Take 40 mg by mouth at bedtime.   . Infant Care Products [facility](DERMACLOUD EX) Apply liberal amount topically as needed to area of skin irritation.  Ok to leave at bedside  . insulin aspart (NOVOLOG) 100 UNIT/ML injection Inject 0-9 Units into the skin 3 (three) times daily with meals. -9 Units, Subcutaneous, 3 times daily with meals, First dose on Sun 08/28/18 at 1200 Correction coverage: Sensitive (thin, NPO, renal) CBG < 70: implement hypoglycemia protocol CBG 70 - 120: 0 units CBG 121 - 150: 1 unit CBG 151 - 200: 2 units CBG 201 - 250: 3 units CBG 251 - 300: 5 units CBG 301 - 350: 7 units CBG 351 - 400: 9 units CBG > 400: call MD and obtain STAT lab verification  . insulin aspart (NOVOLOG) 100 UNIT/ML injection Inject 0-5 Units into the skin at bedtime. Correction coverage: HS scale CBG < 70: implement hypoglycemia protocol CBG 70 - 120: 0 units CBG 121 - 150: 0 units CBG 151 - 200: 0 units CBG 201 - 250: 2  units CBG 251 - 300: 3 units CBG 301 - 350: 4 units CBG 351 - 400: 5 units CBG > 400: call MD and  obtain STAT lab verification  . ipratropium-albuterol (DUONEB) 0.5-2.5 (3) MG/3ML SOLN Take 3 mLs by nebulization 2 (two) times daily.  Marland Kitchen losartan (COZAAR) 25 MG tablet Take 25 mg by mouth at bedtime.   . metoprolol succinate (TOPROL-XL) 100 MG 24 hr tablet Take 100 mg by mouth at bedtime.   . NON FORMULARY Diet Type: NAS. NCS  . NOVOLOG MIX 70/30 FLEXPEN (70-30) 100 UNIT/ML FlexPen Inject 25 Units into the skin 2 (two) times daily.  Marland Kitchen omeprazole (PRILOSEC) 40 MG capsule Take 40 mg by mouth daily.   . sertraline (ZOLOFT) 100 MG tablet Take 100 mg by mouth at bedtime.   Marland Kitchen tiotropium (SPIRIVA HANDIHALER) 18 MCG inhalation capsule Place 1 capsule (18 mcg total) into inhaler and inhale daily.  . traMADol (ULTRAM) 50 MG tablet Take 1 tablet (50 mg total) by mouth every 6 (six) hours as needed for moderate pain.  Marland Kitchen XARELTO 15 MG TABS tablet Take 15 mg by mouth at bedtime.   . [DISCONTINUED] oxyCODONE (OXY IR/ROXICODONE) 5 MG immediate release tablet Take 1-2 tablets (5-10 mg total) by mouth every 4 (four) hours as needed for moderate pain (pain score 4-6). (Patient not taking: Reported on 09/05/2018)  . [DISCONTINUED] predniSONE (STERAPRED UNI-PAK 21 TAB) 10 MG (21) TBPK tablet Take 1 tablet (10 mg total) by mouth daily. Take 6 tablets by mouth for 1 day followed by  5 tablets by mouth for 1 day followed by  4 tablets by mouth for 1 day followed by  3 tablets by mouth for 1 day followed by  2 tablets by mouth for 1 day followed by  1 tablet by mouth for a day and stop (Patient not taking: Reported on 09/05/2018)   No facility-administered encounter medications on file as of 09/05/2018.      SIGNIFICANT DIAGNOSTIC EXAMS   LABS REVIEWED: PREVIOUS:   04-20-18: hgb a1c 5.6 chol 146; ldl 77; trig 148; hdl 39.7  08-25-18: iron 11; tibc 292; ferritin 20  08-26-18: wbc 11.7; hgb 8.2; hct 28.1; mcv  84.4; plt 173; glucose 136; bun 38; creat 1.87; k+ 3.6; na++ 140; ca 8.5   NO NEW LABS.   Review of Systems  Constitutional: Negative for malaise/fatigue.  Respiratory: Negative for cough and shortness of breath.   Cardiovascular: Negative for chest pain, palpitations and leg swelling.  Gastrointestinal: Negative for abdominal pain, constipation and heartburn.  Musculoskeletal: Positive for joint pain. Negative for back pain and myalgias.       Right hip and knee pain is managed   Skin: Negative.   Neurological: Negative for dizziness.  Psychiatric/Behavioral: The patient is not nervous/anxious.     Physical Exam  Constitutional: He is oriented to person, place, and time. He appears well-developed and well-nourished. No distress.  Neck: No thyromegaly present.  Cardiovascular: Normal rate, regular rhythm, normal heart sounds and intact distal pulses.  Pulmonary/Chest: Effort normal and breath sounds normal. No respiratory distress.  Abdominal: Soft. Bowel sounds are normal. He exhibits no distension. There is no tenderness.  Musculoskeletal: He exhibits no edema.  Is able to move all extremities Is status post right femur ORIF Has brace on right leg   Is status post right knee replacement   Lymphadenopathy:    He has no cervical adenopathy.  Neurological: He is alert and oriented to person, place, and time.  Skin: Skin is warm and dry. He is not diaphoretic.  Blister posterior right knee without signs of infection present.   Psychiatric: He has  a normal mood and affect.      ASSESSMENT/ PLAN:  TODAY;  1. Closed nondisplaced supracondylar fracture of distal end of femur without intracondylar extension 2. Primary right knee osteoarthritis:  3. Controlled type 2 diabetes mellitus with stage 3 chronic kidney disase with long term current of insulin  Will stop his SSI Will continue therapy as directed Goal is to return back home Will continue to monitor his status.      MD is aware of resident's narcotic use and is in agreement with current plan of care. We will attempt to wean resident as apropriate   Synthia Innocent NP [facility]  Contact [phone] Monday through Friday 8am- 5pm  After hours call [phone]

## 2018-09-06 DIAGNOSIS — Z794 Long term (current) use of insulin: Secondary | ICD-10-CM

## 2018-09-06 DIAGNOSIS — F324 Major depressive disorder, single episode, in partial remission: Secondary | ICD-10-CM | POA: Insufficient documentation

## 2018-09-06 DIAGNOSIS — R600 Localized edema: Secondary | ICD-10-CM | POA: Insufficient documentation

## 2018-09-06 DIAGNOSIS — E1122 Type 2 diabetes mellitus with diabetic chronic kidney disease: Secondary | ICD-10-CM | POA: Insufficient documentation

## 2018-09-06 DIAGNOSIS — M1A30X0 Chronic gout due to renal impairment, unspecified site, without tophus (tophi): Secondary | ICD-10-CM | POA: Insufficient documentation

## 2018-09-06 DIAGNOSIS — S72456A Nondisplaced supracondylar fracture without intracondylar extension of lower end of unspecified femur, initial encounter for closed fracture: Secondary | ICD-10-CM | POA: Insufficient documentation

## 2018-09-06 DIAGNOSIS — N183 Chronic kidney disease, stage 3 (moderate): Secondary | ICD-10-CM

## 2018-09-06 DIAGNOSIS — Z96651 Presence of right artificial knee joint: Secondary | ICD-10-CM | POA: Insufficient documentation

## 2018-09-06 DIAGNOSIS — E785 Hyperlipidemia, unspecified: Secondary | ICD-10-CM

## 2018-09-06 DIAGNOSIS — E1169 Type 2 diabetes mellitus with other specified complication: Secondary | ICD-10-CM | POA: Insufficient documentation

## 2018-09-06 DIAGNOSIS — M1711 Unilateral primary osteoarthritis, right knee: Secondary | ICD-10-CM | POA: Insufficient documentation

## 2018-09-06 DIAGNOSIS — D649 Anemia, unspecified: Secondary | ICD-10-CM | POA: Insufficient documentation

## 2018-09-06 DIAGNOSIS — K219 Gastro-esophageal reflux disease without esophagitis: Secondary | ICD-10-CM | POA: Insufficient documentation

## 2018-09-06 DIAGNOSIS — I6930 Unspecified sequelae of cerebral infarction: Secondary | ICD-10-CM | POA: Insufficient documentation

## 2018-09-06 DIAGNOSIS — I2782 Chronic pulmonary embolism: Secondary | ICD-10-CM | POA: Insufficient documentation

## 2018-09-07 ENCOUNTER — Non-Acute Institutional Stay (SKILLED_NURSING_FACILITY): Payer: Medicare Other | Admitting: Adult Health

## 2018-09-07 ENCOUNTER — Encounter: Payer: Self-pay | Admitting: Adult Health

## 2018-09-07 DIAGNOSIS — M1711 Unilateral primary osteoarthritis, right knee: Secondary | ICD-10-CM

## 2018-09-07 DIAGNOSIS — Z96651 Presence of right artificial knee joint: Secondary | ICD-10-CM | POA: Diagnosis not present

## 2018-09-07 DIAGNOSIS — I2782 Chronic pulmonary embolism: Secondary | ICD-10-CM | POA: Diagnosis not present

## 2018-09-07 DIAGNOSIS — I10 Essential (primary) hypertension: Secondary | ICD-10-CM

## 2018-09-07 NOTE — Progress Notes (Signed)
[facility]   [facility] Room Number: 209 A Place of Service:  SNF (31)   CODE STATUS: Full Code  Allergies  Allergen Reactions  . Codeine Anaphylaxis  . Morphine Anaphylaxis  . Gabapentin Other (See Comments)    Unsure of what the reaction was   . Stadol [Butorphanol]     Unknown  . Lyrica [Pregabalin] Rash  . Tapentadol Nausea Only    ONSET 01/04/2013    Chief Complaint  Patient presents with  . Medical Management of Chronic Issues    Other chronic pulmonary embolism without acute cor pulmonale; essential hypertension; primary osteoarthritis of right knee. Weekly follow up for the first 30 days post hospitalization     HPI:  He is a 84 year old short term rehab being seen for the management of his chronic illnesses: pulmonary embolism; hypertension; osteoarthritis right knee. He denies any uncontrolled right knee pain; no changes in appetite; no anxiety or insomnia.   Past Medical History:  Diagnosis Date  . Depression   . Diabetes mellitus without complication (HCC)   . Dyspnea   . Elevated lipids   . GERD (gastroesophageal reflux disease)   . Hypertension   . Pulmonary emboli (HCC)   . Renal insufficiency    only has 1 kidney  . Sleep apnea   . Stroke (HCC)    Rt side weakness    Past Surgical History:  Procedure Laterality Date  . 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 AORTA SURGERY    . CAROTID ARTERY - SUBCLAVIAN ARTERY BYPASS GRAFT Bilateral   . CATARACT EXTRACTION W/ INTRAOCULAR LENS  IMPLANT, BILATERAL    . CHOLECYSTECTOMY    . EYE SURGERY    . FEMUR FRACTURE SURGERY Right   . HIP FRACTURE SURGERY Bilateral   . JOINT REPLACEMENT    . ORIF FEMUR FRACTURE Right 08/24/2018   Procedure: OPEN REDUCTION INTERNAL FIXATION (ORIF) SUPRACONDYLAR FEMUR FRACTURE;  Surgeon: Christena Flake, MD;  Location: [facility] ORS;  Service: Orthopedics;  Laterality: Right;  . PROSTATE SURGERY    . REPLACEMENT  TOTAL KNEE Right   . TOTAL HIP ARTHROPLASTY Right 03/02/2017   Procedure: TOTAL HIP ARTHROPLASTY ANTERIOR APPROACH;  Surgeon: Kennedy Bucker, MD;  Location: [facility] ORS;  Service: Orthopedics;  Laterality: Right;  Marland Kitchen VASCULAR SURGERY      Social History   Socioeconomic History  . Marital status: Married    Spouse name: Not on file  . Number of children: Not on file  . Years of education: Not on file  . Highest education level: Not on file  Occupational History  . Not on file  Social Needs  . Financial resource strain: Not on file  . Food insecurity:    Worry: Not on file    Inability: Not on file  . Transportation needs:    Medical: Not on file    Non-medical: Not on file  Tobacco Use  . Smoking status: Former Smoker    Packs/day: 1.00    Years: 7.00    Pack years: 7.00    Types: Cigarettes    Last attempt to quit: 02/18/1952    Years since quitting: 66.5  . Smokeless tobacco: Former Engineer, water and Sexual Activity  . Alcohol use: No  . Drug use: No  . Sexual activity: Not on file  Lifestyle  . Physical activity:    Days per week: Not on file    Minutes  per session: Not on file  . Stress: Not on file  Relationships  . Social connections:    Talks on phone: Not on file    Gets together: Not on file    Attends religious service: Not on file    Active member of club or organization: Not on file    Attends meetings of clubs or organizations: Not on file    Relationship status: Not on file  . Intimate partner violence:    Fear of current or ex partner: Not on file    Emotionally abused: Not on file    Physically abused: Not on file    Forced sexual activity: Not on file  Other Topics Concern  . Not on file  Social History Narrative  . Not on file   Family History  Problem Relation Age of Onset  . Heart attack Father   . Stroke Father   . Heart disease Father   . Heart attack Mother       VITAL SIGNS BP 104/90   Pulse 71   Temp 97.6 F (36.4 C)   Resp  (!) 22   Ht 5\' 10"  (1.778 m)   Wt 204 lb 4.8 oz (92.7 kg)   SpO2 97%   BMI 29.31 kg/m   Outpatient Encounter Medications as of 09/07/2018  Medication Sig  . acetaminophen (TYLENOL) 325 MG tablet Take 2 tablets (650 mg total) by mouth every 6 (six) hours as needed for mild pain (or Fever >/= 101).  Marland Kitchen albuterol (PROVENTIL) (2.5 MG/3ML) 0.083% nebulizer solution Take 3 mLs (2.5 mg total) by nebulization every 4 (four) hours as needed for wheezing or shortness of breath.  . allopurinol (ZYLOPRIM) 100 MG tablet Take 0.5 tablets (50 mg total) by mouth every other day.  Marland Kitchen atorvastatin (LIPITOR) 20 MG tablet Take 20 mg by mouth at bedtime.   . budesonide (PULMICORT) 0.5 MG/2ML nebulizer solution Take 2 mLs (0.5 mg total) by nebulization 2 (two) times daily.  . Cholecalciferol 100 MCG (4000 UT) CAPS Take 1 capsule by mouth daily.  . ferrous sulfate 325 (65 FE) MG EC tablet Take 1 tablet (325 mg total) by mouth 3 (three) times daily with meals.  . furosemide (LASIX) 40 MG tablet Take 40 mg by mouth at bedtime.   . Infant Care Products [facility] EX) Apply liberal amount topically as needed to area of skin irritation.  Ok to leave at bedside  . ipratropium-albuterol (DUONEB) 0.5-2.5 (3) MG/3ML SOLN Take 3 mLs by nebulization 2 (two) times daily.  Marland Kitchen losartan (COZAAR) 25 MG tablet Take 25 mg by mouth at bedtime.   . metoprolol succinate (TOPROL-XL) 100 MG 24 hr tablet Take 100 mg by mouth at bedtime.   . NON FORMULARY Diet Type: NAS. NCS  . NOVOLOG MIX 70/30 FLEXPEN (70-30) 100 UNIT/ML FlexPen Inject 25 Units into the skin daily.   Marland Kitchen omeprazole (PRILOSEC) 40 MG capsule Take 40 mg by mouth daily.   . sertraline (ZOLOFT) 100 MG tablet Take 100 mg by mouth at bedtime.   Marland Kitchen tiotropium (SPIRIVA HANDIHALER) 18 MCG inhalation capsule Place 1 capsule (18 mcg total) into inhaler and inhale daily.  . traMADol (ULTRAM) 50 MG tablet Take 1 tablet (50 mg total) by mouth every 6 (six) hours as needed for moderate  pain.  Marland Kitchen XARELTO 15 MG TABS tablet Take 15 mg by mouth at bedtime.    No facility-administered encounter medications on file as of 09/07/2018.      SIGNIFICANT DIAGNOSTIC  EXAMS  LABS REVIEWED: PREVIOUS:   04-20-18: hgb a1c 5.6 chol 146; ldl 77; trig 148; hdl 39.7  08-25-18: iron 11; tibc 292; ferritin 20  08-26-18: wbc 11.7; hgb 8.2; hct 28.1; mcv 84.4; plt 173; glucose 136; bun 38; creat 1.87; k+ 3.6; na++ 140; ca 8.5   NO NEW LABS.    Review of Systems  Constitutional: Negative for malaise/fatigue.  Respiratory: Negative for cough and shortness of breath.   Cardiovascular: Negative for chest pain, palpitations and leg swelling.  Gastrointestinal: Negative for abdominal pain, constipation and heartburn.  Musculoskeletal: Negative for back pain, joint pain and myalgias.  Skin: Negative.   Neurological: Negative for dizziness.  Psychiatric/Behavioral: The patient is not nervous/anxious.       Physical Exam  Constitutional: He is oriented to person, place, and time. He appears well-developed and well-nourished. No distress.  Neck: No thyromegaly present.  Cardiovascular: Normal rate, regular rhythm, normal heart sounds and intact distal pulses.  Pulmonary/Chest: Effort normal and breath sounds normal. No respiratory distress.  Abdominal: Soft. Bowel sounds are normal. He exhibits no distension. There is no tenderness.  Musculoskeletal: He exhibits no edema.  Is able to move all extremities Is status post right femur ORIF Has brace on right leg   Is status post right knee replacement    Lymphadenopathy:    He has no cervical adenopathy.  Neurological: He is alert and oriented to person, place, and time.  Skin: Skin is warm and dry. He is not diaphoretic.  Psychiatric: He has a normal mood and affect.     ASSESSMENT/ PLAN:   TODAY:   1. Essential hypertension: is stable b/p 104/90: will continue cozaar 25 mg daily toprol xl 100 mg daily   2. Chronic pulmonary  embolism: is stable is on long term xarelto 15 mg daily   3. Primary osteoarthritis of right knee: is status post right knee replacement on 08-24-18: is stable will continue therapy as directed and will follow up with orthopedics as directed has ultram 50 or 100 mg every 4 hours as needed   PREVIOUS   4. COPD with respiratory failure; acute: is stable is currently using 02.  will continue duoneb twice daily spiviria 18 mcg daily albuterol neb every 4 hours as needed pulmicort  neb treatment twice daily   5. Dyslipidemia associated with type 2 diabetes mellitus: is stable LDL will continue lipitor 20 mg daily   6. 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 Controlled type 2 diabetes mellitus with stage 3 chronic kidney disease with long term current use of insulin: is stable hgb a1c 5.6; will continue novolog mix 70/30: 25 units daily   8. PAD(peripheral artery disease) is stable will monitor  9.  Chronic cerebrovascular accident: is stable will continue xarelto 15 mg daily   10. GERD without esophagitis: is stable will continue prilosec 40 mg daily   11.  Closed nondisplaced supracondylar fracture of distal end of right femur without intracondylar extension sequela: is stable will continue therapy as directed and will follow up with orthopedics as indicated. Will onitnue ultram 50 mg every 6 hours as needed.  Has blister on right posterior knee: will continue treatment as directed.   12. Acute on chronic anemia: is stable hgb 8,2 will continue iron three times daily   13. 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 Bilateral lower extremity edema:  is stable will continue lasix 20 mg daily   15. Chronic gout due to renal impairment: is stable will continue allopurinol 50 mg every other day     MD is aware of resident's narcotic use and is in agreement with current plan of care. We will attempt  to wean resident as apropriate   Synthia Innocenteborah Green NP [facility]  Contact [phone] Monday through Friday 8am- 5pm  After hours call [phone]

## 2018-09-12 ENCOUNTER — Non-Acute Institutional Stay (SKILLED_NURSING_FACILITY): Payer: Medicare Other | Admitting: Adult Health

## 2018-09-12 ENCOUNTER — Encounter: Payer: Self-pay | Admitting: Adult Health

## 2018-09-12 DIAGNOSIS — E785 Hyperlipidemia, unspecified: Secondary | ICD-10-CM

## 2018-09-12 DIAGNOSIS — Z794 Long term (current) use of insulin: Secondary | ICD-10-CM

## 2018-09-12 DIAGNOSIS — N183 Chronic kidney disease, stage 3 unspecified: Secondary | ICD-10-CM

## 2018-09-12 DIAGNOSIS — E1169 Type 2 diabetes mellitus with other specified complication: Secondary | ICD-10-CM | POA: Diagnosis not present

## 2018-09-12 DIAGNOSIS — E1122 Type 2 diabetes mellitus with diabetic chronic kidney disease: Secondary | ICD-10-CM

## 2018-09-12 NOTE — Progress Notes (Signed)
[facility]   [facility] Room Number: 209 A Place of Service:  SNF (31)   CODE STATUS: Full Code  Allergies  Allergen Reactions  . Codeine Anaphylaxis  . Morphine Anaphylaxis  . Gabapentin Other (See Comments)    Unsure of what the reaction was   . Stadol [Butorphanol]     Unknown  . Lyrica [Pregabalin] Rash  . Tapentadol Nausea Only    ONSET 01/04/2013    Chief Complaint  Patient presents with  . Acute Visit    DM    HPI:  This is to follow up on the medication changes that were done to stop the SSI and to reduce his novolog mix to one times daily. He denies any excessive thirst or hunger; no uncontrolled pain.   Past Medical History:  Diagnosis Date  . Depression   . Diabetes mellitus without complication (HCC)   . Dyspnea   . Elevated lipids   . GERD (gastroesophageal reflux disease)   . Hypertension   . Pulmonary emboli (HCC)   . Renal insufficiency    only has 1 kidney  . Sleep apnea   . Stroke (HCC)    Rt side weakness    Past Surgical History:  Procedure Laterality Date  . 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 AORTA SURGERY    . CAROTID ARTERY - SUBCLAVIAN ARTERY BYPASS GRAFT Bilateral   . CATARACT EXTRACTION W/ INTRAOCULAR LENS  IMPLANT, BILATERAL    . CHOLECYSTECTOMY    . EYE SURGERY    . FEMUR FRACTURE SURGERY Right   . HIP FRACTURE SURGERY Bilateral   . JOINT REPLACEMENT    . ORIF FEMUR FRACTURE Right 08/24/2018   Procedure: OPEN REDUCTION INTERNAL FIXATION (ORIF) SUPRACONDYLAR FEMUR FRACTURE;  Surgeon: Christena Flake, MD;  Location: [facility] ORS;  Service: Orthopedics;  Laterality: Right;  . PROSTATE SURGERY    . REPLACEMENT TOTAL KNEE Right   . TOTAL HIP ARTHROPLASTY Right 03/02/2017   Procedure: TOTAL HIP ARTHROPLASTY ANTERIOR APPROACH;  Surgeon: Kennedy Bucker, MD;  Location: [facility] ORS;  Service: Orthopedics;  Laterality: Right;  Marland Kitchen VASCULAR SURGERY      Social History    Socioeconomic History  . Marital status: Married    Spouse name: Not on file  . Number of children: Not on file  . Years of education: Not on file  . Highest education level: Not on file  Occupational History  . Not on file  Social Needs  . Financial resource strain: Not on file  . Food insecurity:    Worry: Not on file    Inability: Not on file  . Transportation needs:    Medical: Not on file    Non-medical: Not on file  Tobacco Use  . Smoking status: Former Smoker    Packs/day: 1.00    Years: 7.00    Pack years: 7.00    Types: Cigarettes    Last attempt to quit: 02/18/1952    Years since quitting: 66.6  . Smokeless tobacco: Former Engineer, water and Sexual Activity  . Alcohol use: No  . Drug use: No  . Sexual activity: Not on file  Lifestyle  . Physical activity:    Days per week: Not on file    Minutes per session: Not on file  . Stress: Not on file  Relationships  . Social connections:    Talks on phone: Not on file    Gets together:  Not on file    Attends religious service: Not on file    Active member of club or organization: Not on file    Attends meetings of clubs or organizations: Not on file    Relationship status: Not on file  . Intimate partner violence:    Fear of current or ex partner: Not on file    Emotionally abused: Not on file    Physically abused: Not on file    Forced sexual activity: Not on file  Other Topics Concern  . Not on file  Social History Narrative  . Not on file   Family History  Problem Relation Age of Onset  . Heart attack Father   . Stroke Father   . Heart disease Father   . Heart attack Mother       VITAL SIGNS BP (!) 111/55   Pulse 65   Temp 98.9 F (37.2 C)   Resp 20   Ht 5\' 10"  (1.778 m)   Wt 204 lb 4.8 oz (92.7 kg)   SpO2 93%   BMI 29.31 kg/m   Outpatient Encounter Medications as of 09/12/2018  Medication Sig  . acetaminophen (TYLENOL) 325 MG tablet Take 2 tablets (650 mg total) by mouth every 6  (six) hours as needed for mild pain (or Fever >/= 101).  Marland Kitchen. albuterol (PROVENTIL) (2.5 MG/3ML) 0.083% nebulizer solution Take 3 mLs (2.5 mg total) by nebulization every 4 (four) hours as needed for wheezing or shortness of breath.  . allopurinol (ZYLOPRIM) 100 MG tablet Take 0.5 tablets (50 mg total) by mouth every other day.  Marland Kitchen. atorvastatin (LIPITOR) 20 MG tablet Take 20 mg by mouth at bedtime.   . budesonide (PULMICORT) 0.5 MG/2ML nebulizer solution Take 2 mLs (0.5 mg total) by nebulization 2 (two) times daily.  . Cholecalciferol 100 MCG (4000 UT) CAPS Take 1 capsule by mouth daily.  . ferrous sulfate 325 (65 FE) MG EC tablet Take 1 tablet (325 mg total) by mouth 3 (three) times daily with meals.  . furosemide (LASIX) 40 MG tablet Take 40 mg by mouth at bedtime.   . Infant Care Products [facility] EX) Apply liberal amount topically as needed to area of skin irritation.  Ok to leave at bedside  . ipratropium-albuterol (DUONEB) 0.5-2.5 (3) MG/3ML SOLN Take 3 mLs by nebulization 2 (two) times daily.  Marland Kitchen. loperamide (IMODIUM A-D) 2 MG tablet Take 2 mg by mouth 4 (four) times daily as needed for diarrhea or loose stools.  Marland Kitchen. losartan (COZAAR) 25 MG tablet Take 25 mg by mouth at bedtime.   . metoprolol succinate (TOPROL-XL) 100 MG 24 hr tablet Take 100 mg by mouth at bedtime.   . NON FORMULARY Diet Type: NAS. NCS  . NOVOLOG MIX 70/30 FLEXPEN (70-30) 100 UNIT/ML FlexPen Inject 25 Units into the skin daily.   Marland Kitchen. omeprazole (PRILOSEC) 40 MG capsule Take 40 mg by mouth daily.   . sertraline (ZOLOFT) 100 MG tablet Take 100 mg by mouth at bedtime.   Marland Kitchen. tiotropium (SPIRIVA HANDIHALER) 18 MCG inhalation capsule Place 1 capsule (18 mcg total) into inhaler and inhale daily.  . traMADol (ULTRAM) 50 MG tablet Take 1 tablet (50 mg total) by mouth every 6 (six) hours as needed for moderate pain.  Marland Kitchen. XARELTO 15 MG TABS tablet Take 15 mg by mouth at bedtime.    No facility-administered encounter medications on file as of  09/12/2018.      SIGNIFICANT DIAGNOSTIC EXAMS  LABS REVIEWED: PREVIOUS:  04-20-18: hgb a1c 5.6 chol 146; ldl 77; trig 148; hdl 39.7  08-25-18: iron 11; tibc 292; ferritin 20  08-26-18: wbc 11.7; hgb 8.2; hct 28.1; mcv 84.4; plt 173; glucose 136; bun 38; creat 1.87; k+ 3.6; na++ 140; ca 8.5   NO NEW LABS.   Review of Systems  Constitutional: Negative for malaise/fatigue.  Respiratory: Negative for cough and shortness of breath.   Cardiovascular: Negative for chest pain, palpitations and leg swelling.  Gastrointestinal: Negative for abdominal pain, constipation and heartburn.  Musculoskeletal: Negative for back pain, joint pain and myalgias.  Skin: Negative.   Neurological: Negative for dizziness.  Psychiatric/Behavioral: The patient is not nervous/anxious.     Physical Exam  Constitutional: He is oriented to person, place, and time. He appears well-developed and well-nourished. No distress.  Neck: No thyromegaly present.  Cardiovascular: Normal rate, regular rhythm, normal heart sounds and intact distal pulses.  Pulmonary/Chest: Effort normal and breath sounds normal. No respiratory distress.  Abdominal: Soft. Bowel sounds are normal. He exhibits no distension. There is no tenderness.  Genitourinary:  Genitourinary Comments: Has long term foley   Musculoskeletal: He exhibits no edema.  Is able to move all extremities Is status post right femur ORIF Has brace on right leg   Is status post right knee replacement     Lymphadenopathy:    He has no cervical adenopathy.  Neurological: He is alert and oriented to person, place, and time.  Skin: Skin is warm and dry. He is not diaphoretic.  Psychiatric: He has a normal mood and affect.     ASSESSMENT/ PLAN:   TODAY:   1.  Dyslipidemia associated with type 2 diabetes mellitus: is stable LDL will continue lipitor 20 mg daily   2. CKD stage 3 due to type 2 diabetes mellitus: is stable bun 38 creat 1.87  3. Controlled type 2  diabetes mellitus with stage 3 chronic kidney disease with long term current use of insulin: is stable hgb a1c 5.6; will continue novolog mix 70/30: 25 units daily his cbg readings are improving.       MD is aware of resident's narcotic use and is in agreement with current plan of care. We will attempt to wean resident as apropriate   Synthia Innocent NP [facility]  Contact [phone] Monday through Friday 8am- 5pm  After hours call [phone]

## 2018-09-14 ENCOUNTER — Other Ambulatory Visit: Payer: Self-pay | Admitting: Adult Health

## 2018-09-14 ENCOUNTER — Encounter: Payer: Self-pay | Admitting: Adult Health

## 2018-09-14 ENCOUNTER — Non-Acute Institutional Stay (SKILLED_NURSING_FACILITY): Payer: Medicare Other | Admitting: Adult Health

## 2018-09-14 DIAGNOSIS — M1711 Unilateral primary osteoarthritis, right knee: Secondary | ICD-10-CM

## 2018-09-14 DIAGNOSIS — J9600 Acute respiratory failure, unspecified whether with hypoxia or hypercapnia: Secondary | ICD-10-CM | POA: Diagnosis not present

## 2018-09-14 DIAGNOSIS — Z96651 Presence of right artificial knee joint: Secondary | ICD-10-CM

## 2018-09-14 DIAGNOSIS — J449 Chronic obstructive pulmonary disease, unspecified: Secondary | ICD-10-CM

## 2018-09-14 MED ORDER — ATORVASTATIN CALCIUM 20 MG PO TABS: 20.0000 mg | ORAL_TABLET | Freq: Every day | ORAL | 0 refills | Status: AC

## 2018-09-14 MED ORDER — LOSARTAN POTASSIUM 25 MG PO TABS: 25.0000 mg | ORAL_TABLET | Freq: Every day | ORAL | 0 refills | Status: DC

## 2018-09-14 MED ORDER — IPRATROPIUM-ALBUTEROL 0.5-2.5 (3) MG/3ML IN SOLN: 3.0000 mL | Freq: Two times a day (BID) | RESPIRATORY_TRACT | 0 refills | Status: DC

## 2018-09-14 MED ORDER — TRAMADOL HCL 50 MG PO TABS: 50.0000 mg | ORAL_TABLET | Freq: Four times a day (QID) | ORAL | 0 refills | Status: DC | PRN

## 2018-09-14 MED ORDER — XARELTO 15 MG PO TABS: 15.0000 mg | ORAL_TABLET | Freq: Every day | ORAL | 0 refills | Status: DC

## 2018-09-14 MED ORDER — METOPROLOL SUCCINATE ER 100 MG PO TB24: 100.0000 mg | ORAL_TABLET | Freq: Every day | ORAL | 0 refills | Status: DC

## 2018-09-14 MED ORDER — SERTRALINE HCL 100 MG PO TABS: 100.0000 mg | ORAL_TABLET | Freq: Every day | ORAL | 0 refills | Status: AC

## 2018-09-14 MED ORDER — FUROSEMIDE 40 MG PO TABS: 40.0000 mg | ORAL_TABLET | Freq: Every day | ORAL | 0 refills | Status: DC

## 2018-09-14 MED ORDER — NOVOLOG MIX 70/30 FLEXPEN (70-30) 100 UNIT/ML ~~LOC~~ SUPN: 25.0000 [IU] | PEN_INJECTOR | Freq: Every day | SUBCUTANEOUS | 0 refills | Status: DC

## 2018-09-14 MED ORDER — ALLOPURINOL 100 MG PO TABS: 50.0000 mg | ORAL_TABLET | ORAL | 0 refills | Status: DC

## 2018-09-14 MED ORDER — BUDESONIDE 0.5 MG/2ML IN SUSP: 0.5000 mg | Freq: Two times a day (BID) | RESPIRATORY_TRACT | 12 refills | Status: DC

## 2018-09-14 MED ORDER — ALBUTEROL SULFATE (2.5 MG/3ML) 0.083% IN NEBU: 2.5000 mg | INHALATION_SOLUTION | RESPIRATORY_TRACT | 0 refills | Status: DC | PRN

## 2018-09-14 MED ORDER — FERROUS SULFATE 325 (65 FE) MG PO TBEC: 325.0000 mg | DELAYED_RELEASE_TABLET | Freq: Three times a day (TID) | ORAL | 0 refills | Status: DC

## 2018-09-14 MED ORDER — OMEPRAZOLE 40 MG PO CPDR: 40.0000 mg | DELAYED_RELEASE_CAPSULE | Freq: Every day | ORAL | 0 refills | Status: DC

## 2018-09-14 NOTE — Progress Notes (Signed)
Location:   [facility] Room Number: 209 A Place of Service:  SNF (31)    CODE STATUS: Full Code  Allergies  Allergen Reactions  . Codeine Anaphylaxis  . Morphine Anaphylaxis  . Gabapentin Other (See Comments)    Unsure of what the reaction was   . Stadol [Butorphanol]     Unknown  . Lyrica [Pregabalin] Rash  . Tapentadol Nausea Only    ONSET 01/04/2013    Chief Complaint  Patient presents with  . Discharge Note    Discharging to home 09/16/18    HPI:  This a AMA discharge to home as he is unable to maintain his adls and his wife is unable to provide the necessary assistance. He will need home health for pt/ot/rn/cna/sw. He will need a semi-electric hospital bed; a trapeze; a light weight wheelchair. He will need his prescriptions written and will need to follow up with his medical provider. He had been hospitalized for copd exacerbation; he has had a right femur fracture and a right knee replacement. He was admitted to this facility for short term rehab. He has participate in therapy; however; he is unable to maintain his adls independently.   Past Medical History:  Diagnosis Date  . Depression   . Diabetes mellitus without complication (HCC)   . Dyspnea   . Elevated lipids   . GERD (gastroesophageal reflux disease)   . Hypertension   . Pulmonary emboli (HCC)   . Renal insufficiency    only has 1 kidney  . Sleep apnea   . Stroke (HCC)    Rt side weakness    Past Surgical History:  Procedure Laterality Date  . 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 AORTA SURGERY    . CAROTID ARTERY - SUBCLAVIAN ARTERY BYPASS GRAFT Bilateral   . CATARACT EXTRACTION W/ INTRAOCULAR LENS  IMPLANT, BILATERAL    . CHOLECYSTECTOMY    . EYE SURGERY    . FEMUR FRACTURE SURGERY Right   . HIP FRACTURE SURGERY Bilateral   . JOINT REPLACEMENT    . ORIF FEMUR FRACTURE Right 08/24/2018   Procedure: OPEN REDUCTION INTERNAL  FIXATION (ORIF) SUPRACONDYLAR FEMUR FRACTURE;  Surgeon: Christena Flake, MD;  Location: [facility] ORS;  Service: Orthopedics;  Laterality: Right;  . PROSTATE SURGERY    . REPLACEMENT TOTAL KNEE Right   . TOTAL HIP ARTHROPLASTY Right 03/02/2017   Procedure: TOTAL HIP ARTHROPLASTY ANTERIOR APPROACH;  Surgeon: Kennedy Bucker, MD;  Location: [facility] ORS;  Service: Orthopedics;  Laterality: Right;  Marland Kitchen VASCULAR SURGERY      Social History   Socioeconomic History  . Marital status: Married    Spouse name: Not on file  . Number of children: Not on file  . Years of education: Not on file  . Highest education level: Not on file  Occupational History  . Not on file  Social Needs  . Financial resource strain: Not on file  . Food insecurity:    Worry: Not on file    Inability: Not on file  . Transportation needs:    Medical: Not on file    Non-medical: Not on file  Tobacco Use  . Smoking status: Former Smoker    Packs/day: 1.00    Years: 7.00    Pack years: 7.00    Types: Cigarettes    Last attempt to quit: 02/18/1952    Years since quitting: 66.6  . Smokeless tobacco: Former Neurosurgeon  Substance and Sexual Activity  . Alcohol use: No  . Drug use: No  . Sexual activity: Not on file  Lifestyle  . Physical activity:    Days per week: Not on file    Minutes per session: Not on file  . Stress: Not on file  Relationships  . Social connections:    Talks on phone: Not on file    Gets together: Not on file    Attends religious service: Not on file    Active member of club or organization: Not on file    Attends meetings of clubs or organizations: Not on file    Relationship status: Not on file  . Intimate partner violence:    Fear of current or ex partner: Not on file    Emotionally abused: Not on file    Physically abused: Not on file    Forced sexual activity: Not on file  Other Topics Concern  . Not on file  Social History Narrative  . Not on file   Family History  Problem Relation Age of  Onset  . Heart attack Father   . Stroke Father   . Heart disease Father   . Heart attack Mother     VITAL SIGNS BP (!) 128/55   Pulse 72   Temp (!) 97.5 F (36.4 C)   Resp 20   Ht 5\' 10"  (1.778 m)   Wt 197 lb (89.4 kg)   SpO2 93%   BMI 28.27 kg/m   Patient's Medications  New Prescriptions   No medications on file  Previous Medications   ACETAMINOPHEN (TYLENOL) 325 MG TABLET    Take 2 tablets (650 mg total) by mouth every 6 (six) hours as needed for mild pain (or Fever >/= 101).   ALBUTEROL (PROVENTIL) (2.5 MG/3ML) 0.083% NEBULIZER SOLUTION    Take 3 mLs (2.5 mg total) by nebulization every 4 (four) hours as needed for wheezing or shortness of breath.   ALLOPURINOL (ZYLOPRIM) 100 MG TABLET    Take 0.5 tablets (50 mg total) by mouth every other day.   ATORVASTATIN (LIPITOR) 20 MG TABLET    Take 20 mg by mouth at bedtime.    BUDESONIDE (PULMICORT) 0.5 MG/2ML NEBULIZER SOLUTION    Take 2 mLs (0.5 mg total) by nebulization 2 (two) times daily.   CHOLECALCIFEROL 100 MCG (4000 UT) CAPS    Take 1 capsule by mouth daily.   FERROUS SULFATE 325 (65 FE) MG EC TABLET    Take 1 tablet (325 mg total) by mouth 3 (three) times daily with meals.   FUROSEMIDE (LASIX) 40 MG TABLET    Take 40 mg by mouth at bedtime.    INFANT CARE PRODUCTS (DERMACLOUD EX)    Apply liberal amount topically as needed to area of skin irritation.  Ok to leave at bedside   IPRATROPIUM-ALBUTEROL (DUONEB) 0.5-2.5 (3) MG/3ML SOLN    Take 3 mLs by nebulization 2 (two) times daily.   LOPERAMIDE (IMODIUM A-D) 2 MG TABLET    Take 2 mg by mouth 4 (four) times daily as needed for diarrhea or loose stools.   LOSARTAN (COZAAR) 25 MG TABLET    Take 25 mg by mouth at bedtime.    METOPROLOL SUCCINATE (TOPROL-XL) 100 MG 24 HR TABLET    Take 100 mg by mouth at bedtime.    NON FORMULARY    Diet Type: NAS. NCS   NOVOLOG MIX 70/30 FLEXPEN (70-30) 100 UNIT/ML FLEXPEN    Inject 25 Units into  the skin daily.    OMEPRAZOLE (PRILOSEC) 40 MG  CAPSULE    Take 40 mg by mouth daily.    SERTRALINE (ZOLOFT) 100 MG TABLET    Take 100 mg by mouth at bedtime.    TIOTROPIUM (SPIRIVA) 18 MCG INHALATION CAPSULE    Place 18 mcg into inhaler and inhale daily.   TRAMADOL (ULTRAM) 50 MG TABLET    Take 1 tablet (50 mg total) by mouth every 6 (six) hours as needed for moderate pain.   XARELTO 15 MG TABS TABLET    Take 15 mg by mouth at bedtime.   Modified Medications   No medications on file  Discontinued Medications   TIOTROPIUM (SPIRIVA HANDIHALER) 18 MCG INHALATION CAPSULE    Place 1 capsule (18 mcg total) into inhaler and inhale daily.     SIGNIFICANT DIAGNOSTIC EXAMS   LABS REVIEWED: PREVIOUS:   04-20-18: hgb a1c 5.6 chol 146; ldl 77; trig 148; hdl 39.7  08-25-18: iron 11; tibc 292; ferritin 20  08-26-18: wbc 11.7; hgb 8.2; hct 28.1; mcv 84.4; plt 173; glucose 136; bun 38; creat 1.87; k+ 3.6; na++ 140; ca 8.5   NO NEW LABS.   Review of Systems  Constitutional: Negative for malaise/fatigue.  Respiratory: Negative for cough and shortness of breath.   Cardiovascular: Negative for chest pain, palpitations and leg swelling.  Gastrointestinal: Negative for abdominal pain, constipation and heartburn.  Musculoskeletal: Negative for back pain, joint pain and myalgias.  Skin: Negative.   Neurological: Negative for dizziness.  Psychiatric/Behavioral: The patient is not nervous/anxious.     Physical Exam  Constitutional: He is oriented to person, place, and time. He appears well-developed and well-nourished. No distress.  Neck: No thyromegaly present.  Cardiovascular: Normal rate, regular rhythm, normal heart sounds and intact distal pulses.  Pulmonary/Chest: Effort normal and breath sounds normal. No respiratory distress.  Abdominal: Soft. Bowel sounds are normal. He exhibits no distension. There is no tenderness.  Genitourinary:  Genitourinary Comments: Long term foley   Musculoskeletal: He exhibits no edema.  Is able to move all  extremities Is status post right femur ORIF Has brace on right leg   Is status post right knee replacement    Lymphadenopathy:    He has no cervical adenopathy.  Neurological: He is alert and oriented to person, place, and time.  Skin: Skin is warm and dry. He is not diaphoretic.  Psychiatric: He has a normal mood and affect.    ASSESSMENT/ PLAN:  Patient is being discharged with the following home health services:  Pt/ot/rn/cna/sw: to evaluate and treat as indicated for gait balance strength adl training medication management adl care and community resources.   Patient is being discharged with the following durable medical equipment:  Semi-electric bed due to his copd with respiratory failure requires HOB elevated at least 30 degrees to maintain his 02 sats which cannot be achieved with a standard bed. He requires use of trapeze for bed mobility.  He requires a light wheel chair with elevated leg rests cushion; anti-tippers; brake extensions to allow him to maintain his current level of independence with his adls which cannot be achieved with a walker; can self propel; requires light weight due to poor physical endurance.    Patient has been advised to f/u with their PCP in 1-2 weeks to bring them up to date on their rehab stay.  Social services at facility was responsible for arranging this appointment.  Pt was provided with a 30 day supply of prescriptions  for medications and refills must be obtained from their PCP.  For controlled substances, a more limited supply may be provided adequate until PCP appointment only.  A 30 day supply of his prescription medications have been sent to [facility] on [address] in [place] with #15 ultram 50 mg tabs.   Time spent with patient and discharge process: discussed home health needs; care at home; dme; medications; AMA due to safety issues. He did state that [place]    Synthia Innocent NP [facility]    Contact [phone] Monday through Friday 8am- 5pm  After hours call [phone]

## 2018-09-15 ENCOUNTER — Encounter
Admission: RE | Admit: 2018-09-15 | Discharge: 2018-09-15 | Disposition: A | Discharge status: Home / Self Care | Payer: Medicare Other | Source: Ambulatory Visit | Attending: Internal Medicine | Admitting: Internal Medicine

## 2018-09-16 ENCOUNTER — Telehealth: Payer: Self-pay

## 2018-09-16 NOTE — Telephone Encounter (Addendum)
Piror authorization started on Ipratropium-Albuterol 0.5-2.5 patient has been discharged from facility, However this medication was prescribed before the patient was discharged.  Key [id number]  Awaiting a response

## 2018-09-18 DIAGNOSIS — E1151 Type 2 diabetes mellitus with diabetic peripheral angiopathy without gangrene: Secondary | ICD-10-CM

## 2018-09-18 DIAGNOSIS — J441 Chronic obstructive pulmonary disease with (acute) exacerbation: Secondary | ICD-10-CM

## 2018-09-18 DIAGNOSIS — M9711XD Periprosthetic fracture around internal prosthetic right knee joint, subsequent encounter: Secondary | ICD-10-CM | POA: Diagnosis not present

## 2018-09-18 DIAGNOSIS — F329 Major depressive disorder, single episode, unspecified: Secondary | ICD-10-CM

## 2018-09-18 DIAGNOSIS — S72454D Nondisplaced supracondylar fracture without intracondylar extension of lower end of right femur, subsequent encounter for closed fracture with routine healing: Secondary | ICD-10-CM | POA: Diagnosis not present

## 2018-09-18 DIAGNOSIS — N183 Chronic kidney disease, stage 3 (moderate): Secondary | ICD-10-CM

## 2018-09-18 DIAGNOSIS — E1122 Type 2 diabetes mellitus with diabetic chronic kidney disease: Secondary | ICD-10-CM

## 2018-09-18 DIAGNOSIS — I129 Hypertensive chronic kidney disease with stage 1 through stage 4 chronic kidney disease, or unspecified chronic kidney disease: Secondary | ICD-10-CM | POA: Diagnosis not present

## 2018-10-06 ENCOUNTER — Encounter: Payer: Self-pay | Admitting: Emergency Medicine

## 2018-10-06 ENCOUNTER — Inpatient Hospital Stay: Payer: Medicare Other

## 2018-10-06 ENCOUNTER — Inpatient Hospital Stay
Admission: EM | Admit: 2018-10-06 | Discharge: 2018-10-10 | DRG: 871 | Disposition: A | Discharge status: Home Health | Payer: Medicare Other | Attending: Specialist | Admitting: Specialist

## 2018-10-06 ENCOUNTER — Emergency Department: Payer: Medicare Other

## 2018-10-06 ENCOUNTER — Other Ambulatory Visit: Payer: Self-pay

## 2018-10-06 DIAGNOSIS — Z936 Other artificial openings of urinary tract status: Secondary | ICD-10-CM

## 2018-10-06 DIAGNOSIS — J9601 Acute respiratory failure with hypoxia: Secondary | ICD-10-CM | POA: Diagnosis present

## 2018-10-06 DIAGNOSIS — Y838 Other surgical procedures as the cause of abnormal reaction of the patient, or of later complication, without mention of misadventure at the time of the procedure: Secondary | ICD-10-CM | POA: Diagnosis present

## 2018-10-06 DIAGNOSIS — T8131XA Disruption of external operation (surgical) wound, not elsewhere classified, initial encounter: Secondary | ICD-10-CM | POA: Diagnosis present

## 2018-10-06 DIAGNOSIS — Z8673 Personal history of transient ischemic attack (TIA), and cerebral infarction without residual deficits: Secondary | ICD-10-CM

## 2018-10-06 DIAGNOSIS — E1122 Type 2 diabetes mellitus with diabetic chronic kidney disease: Secondary | ICD-10-CM | POA: Diagnosis present

## 2018-10-06 DIAGNOSIS — Z888 Allergy status to other drugs, medicaments and biological substances status: Secondary | ICD-10-CM

## 2018-10-06 DIAGNOSIS — R112 Nausea with vomiting, unspecified: Secondary | ICD-10-CM | POA: Diagnosis present

## 2018-10-06 DIAGNOSIS — Z79899 Other long term (current) drug therapy: Secondary | ICD-10-CM | POA: Diagnosis not present

## 2018-10-06 DIAGNOSIS — L03115 Cellulitis of right lower limb: Secondary | ICD-10-CM | POA: Diagnosis present

## 2018-10-06 DIAGNOSIS — Z7901 Long term (current) use of anticoagulants: Secondary | ICD-10-CM

## 2018-10-06 DIAGNOSIS — A419 Sepsis, unspecified organism: Principal | ICD-10-CM | POA: Diagnosis present

## 2018-10-06 DIAGNOSIS — N183 Chronic kidney disease, stage 3 unspecified: Secondary | ICD-10-CM

## 2018-10-06 DIAGNOSIS — Z8551 Personal history of malignant neoplasm of bladder: Secondary | ICD-10-CM

## 2018-10-06 DIAGNOSIS — Z96641 Presence of right artificial hip joint: Secondary | ICD-10-CM | POA: Diagnosis present

## 2018-10-06 DIAGNOSIS — E785 Hyperlipidemia, unspecified: Secondary | ICD-10-CM | POA: Diagnosis present

## 2018-10-06 DIAGNOSIS — R059 Cough, unspecified: Secondary | ICD-10-CM

## 2018-10-06 DIAGNOSIS — Z9842 Cataract extraction status, left eye: Secondary | ICD-10-CM

## 2018-10-06 DIAGNOSIS — R531 Weakness: Secondary | ICD-10-CM | POA: Diagnosis present

## 2018-10-06 DIAGNOSIS — M109 Gout, unspecified: Secondary | ICD-10-CM | POA: Diagnosis present

## 2018-10-06 DIAGNOSIS — Z8249 Family history of ischemic heart disease and other diseases of the circulatory system: Secondary | ICD-10-CM

## 2018-10-06 DIAGNOSIS — R0602 Shortness of breath: Secondary | ICD-10-CM

## 2018-10-06 DIAGNOSIS — Z96651 Presence of right artificial knee joint: Secondary | ICD-10-CM | POA: Diagnosis present

## 2018-10-06 DIAGNOSIS — M21372 Foot drop, left foot: Secondary | ICD-10-CM | POA: Diagnosis present

## 2018-10-06 DIAGNOSIS — J441 Chronic obstructive pulmonary disease with (acute) exacerbation: Secondary | ICD-10-CM | POA: Diagnosis present

## 2018-10-06 DIAGNOSIS — N179 Acute kidney failure, unspecified: Secondary | ICD-10-CM | POA: Diagnosis present

## 2018-10-06 DIAGNOSIS — Z87891 Personal history of nicotine dependence: Secondary | ICD-10-CM

## 2018-10-06 DIAGNOSIS — Z961 Presence of intraocular lens: Secondary | ICD-10-CM | POA: Diagnosis present

## 2018-10-06 DIAGNOSIS — Z9889 Other specified postprocedural states: Secondary | ICD-10-CM

## 2018-10-06 DIAGNOSIS — Z9079 Acquired absence of other genital organ(s): Secondary | ICD-10-CM

## 2018-10-06 DIAGNOSIS — I129 Hypertensive chronic kidney disease with stage 1 through stage 4 chronic kidney disease, or unspecified chronic kidney disease: Secondary | ICD-10-CM | POA: Diagnosis present

## 2018-10-06 DIAGNOSIS — F329 Major depressive disorder, single episode, unspecified: Secondary | ICD-10-CM | POA: Diagnosis present

## 2018-10-06 DIAGNOSIS — Z7951 Long term (current) use of inhaled steroids: Secondary | ICD-10-CM

## 2018-10-06 DIAGNOSIS — G4733 Obstructive sleep apnea (adult) (pediatric): Secondary | ICD-10-CM | POA: Diagnosis present

## 2018-10-06 DIAGNOSIS — Z8546 Personal history of malignant neoplasm of prostate: Secondary | ICD-10-CM

## 2018-10-06 DIAGNOSIS — Z9841 Cataract extraction status, right eye: Secondary | ICD-10-CM

## 2018-10-06 DIAGNOSIS — Z86711 Personal history of pulmonary embolism: Secondary | ICD-10-CM

## 2018-10-06 DIAGNOSIS — N390 Urinary tract infection, site not specified: Secondary | ICD-10-CM

## 2018-10-06 DIAGNOSIS — Z794 Long term (current) use of insulin: Secondary | ICD-10-CM

## 2018-10-06 DIAGNOSIS — K219 Gastro-esophageal reflux disease without esophagitis: Secondary | ICD-10-CM | POA: Diagnosis present

## 2018-10-06 DIAGNOSIS — R05 Cough: Secondary | ICD-10-CM

## 2018-10-06 DIAGNOSIS — Z885 Allergy status to narcotic agent status: Secondary | ICD-10-CM

## 2018-10-06 DIAGNOSIS — R0902 Hypoxemia: Secondary | ICD-10-CM

## 2018-10-06 LAB — LACTIC ACID, PLASMA: Lactic Acid, Venous: 2.7 mmol/L (ref 0.5–1.9)

## 2018-10-06 LAB — URINALYSIS, COMPLETE (UACMP) WITH MICROSCOPIC
Bilirubin Urine: NEGATIVE
Glucose, UA: 50 mg/dL — AB
Hgb urine dipstick: NEGATIVE
Ketones, ur: NEGATIVE mg/dL
Nitrite: NEGATIVE
Protein, ur: NEGATIVE mg/dL
Specific Gravity, Urine: 1.006 (ref 1.005–1.030)
Squamous Epithelial / HPF: NONE SEEN (ref 0–5)
WBC, UA: >50 WBC/hpf — ABNORMAL HIGH (ref 0–5)
pH: 7 (ref 5.0–8.0)

## 2018-10-06 LAB — COMPREHENSIVE METABOLIC PANEL
ALK PHOS: 69 U/L (ref 38–126)
ALT: 14 U/L (ref 0–44)
AST: 25 U/L (ref 15–41)
Albumin: 3.4 g/dL — ABNORMAL LOW (ref 3.5–5.0)
Anion gap: 10 (ref 5–15)
BUN: 47 mg/dL — ABNORMAL HIGH (ref 8–23)
CALCIUM: 8.9 mg/dL (ref 8.9–10.3)
CO2: 26 mmol/L (ref 22–32)
CREATININE: 2.61 mg/dL — AB (ref 0.61–1.24)
Chloride: 101 mmol/L (ref 98–111)
GFR calc Af Amer: 25 mL/min — ABNORMAL LOW (ref >60)
GFR calc non Af Amer: 22 mL/min — ABNORMAL LOW (ref >60)
Glucose, Bld: 119 mg/dL — ABNORMAL HIGH (ref 70–99)
Potassium: 4.6 mmol/L (ref 3.5–5.1)
Sodium: 137 mmol/L (ref 135–145)
TOTAL PROTEIN: 6.7 g/dL (ref 6.5–8.1)
Total Bilirubin: 0.7 mg/dL (ref 0.3–1.2)

## 2018-10-06 LAB — CBC WITH DIFFERENTIAL/PLATELET
Abs Immature Granulocytes: 0.14 10*3/uL — ABNORMAL HIGH (ref 0.00–0.07)
Basophils Absolute: 0.1 10*3/uL (ref 0.0–0.1)
Basophils Relative: 0 %
Eosinophils Absolute: 0 10*3/uL (ref 0.0–0.5)
Eosinophils Relative: 0 %
HCT: 35 % — ABNORMAL LOW (ref 39.0–52.0)
Hemoglobin: 10.5 g/dL — ABNORMAL LOW (ref 13.0–17.0)
Immature Granulocytes: 1 %
LYMPHS ABS: 1.1 10*3/uL (ref 0.7–4.0)
Lymphocytes Relative: 6 %
MCH: 25.8 pg — ABNORMAL LOW (ref 26.0–34.0)
MCHC: 30 g/dL (ref 30.0–36.0)
MCV: 86 fL (ref 80.0–100.0)
MONO ABS: 1.7 10*3/uL — AB (ref 0.1–1.0)
Monocytes Relative: 9 %
NEUTROS ABS: 16.6 10*3/uL — AB (ref 1.7–7.7)
Neutrophils Relative %: 84 %
Platelets: 186 10*3/uL (ref 150–400)
RBC: 4.07 MIL/uL — ABNORMAL LOW (ref 4.22–5.81)
RDW: 18 % — ABNORMAL HIGH (ref 11.5–15.5)
WBC: 19.7 10*3/uL — ABNORMAL HIGH (ref 4.0–10.5)
nRBC: 0 % (ref 0.0–0.2)

## 2018-10-06 LAB — INFLUENZA PANEL BY PCR (TYPE A & B)
Influenza A By PCR: NEGATIVE
Influenza B By PCR: NEGATIVE

## 2018-10-06 LAB — TROPONIN I: Troponin I: 0.06 ng/mL (ref <0.03)

## 2018-10-06 LAB — GLUCOSE, CAPILLARY: Glucose-Capillary: 134 mg/dL — ABNORMAL HIGH (ref 70–99)

## 2018-10-06 LAB — CG4 I-STAT (LACTIC ACID): Lactic Acid, Venous: 1.78 mmol/L (ref 0.5–1.9)

## 2018-10-06 MED ORDER — SODIUM CHLORIDE 0.9 % IV SOLN
1.0000 g | INTRAVENOUS | Status: DC
  Administered 2018-10-06 – 2018-10-09 (×4): 1 g via INTRAVENOUS
  Filled 2018-10-06: qty 10
  Filled 2018-10-06: qty 1
  Filled 2018-10-06: qty 10
  Filled 2018-10-06: qty 1
  Filled 2018-10-06: qty 10
  Filled 2018-10-06: qty 1

## 2018-10-06 MED ORDER — METOPROLOL SUCCINATE ER 50 MG PO TB24
100.0000 mg | ORAL_TABLET | Freq: Every day | ORAL | Status: DC
  Administered 2018-10-06 – 2018-10-09 (×4): 100 mg via ORAL
  Filled 2018-10-06 (×4): qty 2

## 2018-10-06 MED ORDER — SODIUM CHLORIDE 0.9 % IV BOLUS
1000.0000 mL | Freq: Once | INTRAVENOUS | Status: AC
  Administered 2018-10-06: 1000 mL via INTRAVENOUS

## 2018-10-06 MED ORDER — PANTOPRAZOLE SODIUM 40 MG PO TBEC
40.0000 mg | DELAYED_RELEASE_TABLET | Freq: Every day | ORAL | Status: DC
  Administered 2018-10-06 – 2018-10-10 (×5): 40 mg via ORAL
  Filled 2018-10-06 (×5): qty 1

## 2018-10-06 MED ORDER — ONDANSETRON HCL 4 MG/2ML IJ SOLN
4.0000 mg | Freq: Once | INTRAMUSCULAR | Status: AC
  Administered 2018-10-06: 4 mg via INTRAVENOUS
  Filled 2018-10-06: qty 2

## 2018-10-06 MED ORDER — ACETAMINOPHEN 325 MG PO TABS
650.0000 mg | ORAL_TABLET | Freq: Four times a day (QID) | ORAL | Status: DC | PRN
  Administered 2018-10-07 – 2018-10-10 (×5): 650 mg via ORAL
  Filled 2018-10-06 (×5): qty 2

## 2018-10-06 MED ORDER — FENTANYL CITRATE (PF) 100 MCG/2ML IJ SOLN
INTRAMUSCULAR | Status: AC
  Administered 2018-10-06: 50 ug via INTRAVENOUS
  Filled 2018-10-06: qty 2

## 2018-10-06 MED ORDER — SODIUM CHLORIDE 0.9 % IV SOLN
INTRAVENOUS | Status: DC
  Administered 2018-10-06: 21:00:00 via INTRAVENOUS

## 2018-10-06 MED ORDER — ONDANSETRON HCL 4 MG/2ML IJ SOLN: 4.0000 mg | Freq: Four times a day (QID) | INTRAMUSCULAR | Status: DC | PRN

## 2018-10-06 MED ORDER — TRAZODONE HCL 50 MG PO TABS
25.0000 mg | ORAL_TABLET | Freq: Every evening | ORAL | Status: DC | PRN
  Administered 2018-10-07 – 2018-10-10 (×2): 25 mg via ORAL
  Filled 2018-10-06 (×2): qty 1

## 2018-10-06 MED ORDER — RIVAROXABAN 15 MG PO TABS
15.0000 mg | ORAL_TABLET | Freq: Every day | ORAL | Status: DC
  Administered 2018-10-06 – 2018-10-09 (×4): 15 mg via ORAL
  Filled 2018-10-06 (×5): qty 1

## 2018-10-06 MED ORDER — DOCUSATE SODIUM 100 MG PO CAPS
100.0000 mg | ORAL_CAPSULE | Freq: Two times a day (BID) | ORAL | Status: DC
  Administered 2018-10-06 – 2018-10-10 (×8): 100 mg via ORAL
  Filled 2018-10-06 (×8): qty 1

## 2018-10-06 MED ORDER — SERTRALINE HCL 50 MG PO TABS
100.0000 mg | ORAL_TABLET | Freq: Every day | ORAL | Status: DC
  Administered 2018-10-06 – 2018-10-09 (×4): 100 mg via ORAL
  Filled 2018-10-06 (×4): qty 2

## 2018-10-06 MED ORDER — ONDANSETRON HCL 4 MG PO TABS: 4.0000 mg | ORAL_TABLET | Freq: Four times a day (QID) | ORAL | Status: DC | PRN

## 2018-10-06 MED ORDER — MIRTAZAPINE 15 MG PO TABS
15.0000 mg | ORAL_TABLET | Freq: Every day | ORAL | Status: DC
  Administered 2018-10-06 – 2018-10-09 (×3): 15 mg via ORAL
  Filled 2018-10-06 (×4): qty 1

## 2018-10-06 MED ORDER — BISACODYL 5 MG PO TBEC
5.0000 mg | DELAYED_RELEASE_TABLET | Freq: Every day | ORAL | Status: DC | PRN
  Administered 2018-10-08 – 2018-10-09 (×2): 5 mg via ORAL
  Filled 2018-10-06 (×3): qty 1

## 2018-10-06 MED ORDER — ACETAMINOPHEN 650 MG RE SUPP: 650.0000 mg | Freq: Four times a day (QID) | RECTAL | Status: DC | PRN

## 2018-10-06 MED ORDER — FERROUS SULFATE 325 (65 FE) MG PO TABS
325.0000 mg | ORAL_TABLET | Freq: Three times a day (TID) | ORAL | Status: DC
  Administered 2018-10-07 – 2018-10-10 (×10): 325 mg via ORAL
  Filled 2018-10-06 (×10): qty 1

## 2018-10-06 MED ORDER — ALBUTEROL SULFATE (2.5 MG/3ML) 0.083% IN NEBU: 2.5000 mg | INHALATION_SOLUTION | RESPIRATORY_TRACT | Status: DC | PRN

## 2018-10-06 MED ORDER — ATORVASTATIN CALCIUM 20 MG PO TABS
20.0000 mg | ORAL_TABLET | Freq: Every day | ORAL | Status: DC
  Administered 2018-10-06 – 2018-10-09 (×4): 20 mg via ORAL
  Filled 2018-10-06 (×4): qty 1

## 2018-10-06 MED ORDER — TRAMADOL HCL 50 MG PO TABS
50.0000 mg | ORAL_TABLET | Freq: Four times a day (QID) | ORAL | Status: DC | PRN
  Administered 2018-10-06 – 2018-10-07 (×2): 50 mg via ORAL
  Filled 2018-10-06 (×2): qty 1

## 2018-10-06 MED ORDER — ALLOPURINOL 100 MG PO TABS
50.0000 mg | ORAL_TABLET | ORAL | Status: DC
  Administered 2018-10-07 – 2018-10-09 (×2): 50 mg via ORAL
  Filled 2018-10-06 (×2): qty 0.5

## 2018-10-06 MED ORDER — FENTANYL CITRATE (PF) 100 MCG/2ML IJ SOLN
50.0000 ug | Freq: Once | INTRAMUSCULAR | Status: AC
  Administered 2018-10-06: 50 ug via INTRAVENOUS

## 2018-10-06 MED ORDER — BUDESONIDE 0.5 MG/2ML IN SUSP
0.5000 mg | Freq: Two times a day (BID) | RESPIRATORY_TRACT | Status: DC
  Administered 2018-10-06 – 2018-10-07 (×2): 0.5 mg via RESPIRATORY_TRACT
  Filled 2018-10-06 (×2): qty 2

## 2018-10-06 NOTE — Plan of Care (Signed)
  Problem: Education: Goal: Knowledge of General Education information will improve Description Including pain rating scale, medication(s)/side effects and non-pharmacologic comfort measures Outcome: Progressing   

## 2018-10-06 NOTE — Consult Note (Signed)
ORTHOPAEDIC CONSULTATION  PATIENT NAME: Gary Thomas DOB: 02-Jul-1932  MRN: [id number]  REQUESTING PHYSICIAN: Katha Hamming, MD  Chief Complaint: Wound breakdown of the right knee  HPI: Gary Thomas is a 84 y.o. male who underwent open reduction and internal fixation of a right periprosthetic distal femur fracture on 08/24/2018.  He has remained nonweightbearing to the right lower extremity with a knee range of motion brace locked in extension.  He presented to [facility] Emergency Department earlier today with complaints of low-grade fever, cough, and malaise.  He relates having some discomfort to the right leg over the last several days.  White count was noted to be elevated and there was an isolated area of drainage from the distal aspect of the lateral knee incision.  Past Medical History:  Diagnosis Date  . Depression   . Diabetes mellitus without complication (HCC)   . Dyspnea   . Elevated lipids   . Femur fracture (HCC) 08/22/2018  . GERD (gastroesophageal reflux disease)   . Hypertension   . Pulmonary emboli (HCC)   . Renal insufficiency    only has 1 kidney  . Sleep apnea   . Stroke (HCC)    Rt side weakness   Past Surgical History:  Procedure Laterality Date  . 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 AORTA SURGERY    . CAROTID ARTERY - SUBCLAVIAN ARTERY BYPASS GRAFT Bilateral   . CATARACT EXTRACTION W/ INTRAOCULAR LENS  IMPLANT, BILATERAL    . CHOLECYSTECTOMY    . EYE SURGERY    . FEMUR FRACTURE SURGERY Right   . HIP FRACTURE SURGERY Bilateral   . JOINT REPLACEMENT    . ORIF FEMUR FRACTURE Right 08/24/2018   Procedure: OPEN REDUCTION INTERNAL FIXATION (ORIF) SUPRACONDYLAR FEMUR FRACTURE;  Surgeon: Christena Flake, MD;  Location: [facility] ORS;  Service: Orthopedics;  Laterality: Right;  . PROSTATE SURGERY    . REPLACEMENT TOTAL KNEE Right   . TOTAL HIP ARTHROPLASTY Right 03/02/2017   Procedure: TOTAL HIP  ARTHROPLASTY ANTERIOR APPROACH;  Surgeon: Kennedy Bucker, MD;  Location: [facility] ORS;  Service: Orthopedics;  Laterality: Right;  Marland Kitchen VASCULAR SURGERY     Social History   Socioeconomic History  . Marital status: Married    Spouse name: Not on file  . Number of children: Not on file  . Years of education: Not on file  . Highest education level: Not on file  Occupational History  . Not on file  Social Needs  . Financial resource strain: Not on file  . Food insecurity:    Worry: Not on file    Inability: Not on file  . Transportation needs:    Medical: Not on file    Non-medical: Not on file  Tobacco Use  . Smoking status: Former Smoker    Packs/day: 1.00    Years: 7.00    Pack years: 7.00    Types: Cigarettes    Last attempt to quit: 02/18/1952    Years since quitting: 66.6  . Smokeless tobacco: Former Engineer, water and Sexual Activity  . Alcohol use: No  . Drug use: No  . Sexual activity: Not on file  Lifestyle  . Physical activity:    Days per week: Not on file    Minutes per session: Not on file  . Stress: Not on file  Relationships  . Social connections:    Talks on phone: Not on file    Gets together: Not  on file    Attends religious service: Not on file    Active member of club or organization: Not on file    Attends meetings of clubs or organizations: Not on file    Relationship status: Not on file  Other Topics Concern  . Not on file  Social History Narrative  . Not on file   Family History  Problem Relation Age of Onset  . Heart attack Father   . Stroke Father   . Heart disease Father   . Heart attack Mother    Allergies  Allergen Reactions  . Codeine Anaphylaxis  . Morphine Anaphylaxis  . Gabapentin Other (See Comments)    Unsure of what the reaction was   . Stadol [Butorphanol]     Unknown  . Lyrica [Pregabalin] Rash  . Tapentadol Nausea Only    ONSET 01/04/2013   Prior to Admission medications   Medication Sig Start Date End Date Taking?  Authorizing Provider  acetaminophen (TYLENOL) 325 MG tablet Take 2 tablets (650 mg total) by mouth every 6 (six) hours as needed for mild pain (or Fever >/= 101). 08/30/18  Yes Gouru, Deanna ArtisAruna, MD  albuterol (PROVENTIL) (2.5 MG/3ML) 0.083% nebulizer solution Take 3 mLs (2.5 mg total) by nebulization every 4 (four) hours as needed for wheezing or shortness of breath. 09/14/18  Yes Sharee HolsterGreen, Deborah S, NP  allopurinol (ZYLOPRIM) 100 MG tablet Take 0.5 tablets (50 mg total) by mouth every other day. 09/14/18  Yes Sharee HolsterGreen, Deborah S, NP  atorvastatin (LIPITOR) 20 MG tablet Take 1 tablet (20 mg total) by mouth at bedtime. 09/14/18  Yes Sharee HolsterGreen, Deborah S, NP  budesonide (PULMICORT) 0.5 MG/2ML nebulizer solution Take 2 mLs (0.5 mg total) by nebulization 2 (two) times daily. 09/14/18  Yes Sharee HolsterGreen, Deborah S, NP  ferrous sulfate 325 (65 FE) MG EC tablet Take 1 tablet (325 mg total) by mouth 3 (three) times daily with meals. 09/14/18  Yes Sharee HolsterGreen, Deborah S, NP  furosemide (LASIX) 40 MG tablet Take 1 tablet (40 mg total) by mouth at bedtime. 09/14/18  Yes Sharee HolsterGreen, Deborah S, NP  Infant Care Products [facility](DERMACLOUD EX) Apply liberal amount topically as needed to area of skin irritation.  Ok to leave at bedside 09/02/18  Yes [provider]  ipratropium-albuterol (DUONEB) 0.5-2.5 (3) MG/3ML SOLN Take 3 mLs by nebulization 2 (two) times daily. 09/14/18  Yes Sharee HolsterGreen, Deborah S, NP  loperamide (IMODIUM A-D) 2 MG tablet Take 2 mg by mouth 4 (four) times daily as needed for diarrhea or loose stools. 09/07/18  Yes [provider]  losartan (COZAAR) 25 MG tablet Take 1 tablet (25 mg total) by mouth at bedtime. 09/14/18  Yes Sharee HolsterGreen, Deborah S, NP  metoprolol succinate (TOPROL-XL) 100 MG 24 hr tablet Take 1 tablet (100 mg total) by mouth at bedtime. 09/14/18  Yes Sharee HolsterGreen, Deborah S, NP  mirtazapine (REMERON) 15 MG tablet Take 15 mg by mouth at bedtime.   Yes [provider]  multivitamin-lutein (OCUVITE-LUTEIN) CAPS capsule  Take 1 capsule by mouth daily.   Yes [provider]  NOVOLOG MIX 70/30 FLEXPEN (70-30) 100 UNIT/ML FlexPen Inject 0.25 mLs (25 Units total) into the skin daily. Patient taking differently: Inject 25 Units into the skin 2 (two) times daily with a meal.  09/14/18  Yes Sharee HolsterGreen, Deborah S, NP  nystatin (MYCOSTATIN/NYSTOP) powder Apply topically as directed. Apply with each ostomy change as instructed   Yes [provider]  omeprazole (PRILOSEC) 40 MG capsule Take 1 capsule (  40 mg total) by mouth daily. 09/14/18  Yes Sharee HolsterGreen, Deborah S, NP  XARELTO 15 MG TABS tablet Take 1 tablet (15 mg total) by mouth at bedtime. 09/14/18  Yes Sharee HolsterGreen, Deborah S, NP  Cholecalciferol 100 MCG (4000 UT) CAPS Take 1 capsule by mouth daily. 08/31/18   [provider]  NON FORMULARY Diet Type: NAS. NCS    [provider]  sertraline (ZOLOFT) 100 MG tablet Take 1 tablet (100 mg total) by mouth at bedtime. 09/14/18   Sharee HolsterGreen, Deborah S, NP  tiotropium (SPIRIVA) 18 MCG inhalation capsule Place 18 mcg into inhaler and inhale daily. 08/30/18   [provider]  traMADol (ULTRAM) 50 MG tablet Take 1 tablet (50 mg total) by mouth every 6 (six) hours as needed for moderate pain. 09/14/18   Sharee HolsterGreen, Deborah S, NP   Dg Chest 2 View  Result Date: 10/06/2018 CLINICAL DATA:  Difficulty breathing today, recent femoral surgery, some nausea and vomiting yesterday EXAM: CHEST - 2 VIEW COMPARISON:  Portable chest x-ray of 08/28/2018 FINDINGS: There is chronic elevation of the right hemidiaphragm with resultant right basilar linear atelectasis. No definite pneumonia or pleural effusion is seen. There is some peribronchial thickening which could indicate bronchitis. Mediastinal and hilar contours are unremarkable and heart size is stable. Clips are noted from prior thyroidectomy. No acute bony abnormality is seen. IMPRESSION: 1. Peribronchial thickening may indicate bronchitis. 2. Chronic elevation of the right  hemidiaphragm with resultant right basilar linear atelectasis. Electronically Signed   By: Dwyane DeePaul  Barry M.D.   On: 10/06/2018 11:40    Positive ROS: All other systems have been reviewed and were otherwise negative with the exception of those mentioned in the HPI and as above.  Physical Exam: General: Well developed, well nourished male seen in no acute distress. Neurologic: Awake, alert, and oriented. Sensory function is grossly intact. Motor strength is felt to be 5 over 5 bilaterally. No clonus or tremor. Good motor coordination. Lymphatic: No axillary or cervical lymphadenopathy  MUSCULOSKELETAL: Emanation of the right lower extremity was performed.  The range of motion brace was removed.  Steri-Strips removed from the lateral incision.  There was also an old Band-Aid to the posterior aspect of the distal thigh.  The previous blister posteriorly was well-healed.  There was an area of approximately 1 cm with opening of the distal incision.  Slight surrounding erythema was noted.  No appreciable knee effusion.  Mild tenderness was noted along the distal aspect of the incision.  The remainder of the incision was well approximated.  No attempt was made to check range of motion of the knee.  The wound was cleaned with hydrogen peroxide and patted dry.  Betadine solution was painted along the distal aspect of the incision and dressings were applied.  A padded dressing was also applied to the previous blister site.  Good clinical alignment was noted.  The range of motion brace was reapplied, locked in extension.  Assessment: Status post open reduction internal fixation right periprosthetic distal femur fracture Cellulitis and localized wound breakdown along the distal aspect of the lateral based incision.  Plan: The findings were discussed with the patient. The patient apparently has a follow-up appointment with Dr. Margaretann LovelessPoe G on Monday.  I will order portable radiographs of the right distal femur. We  will continue with local wound care.  Truda Staub P. Angie FavaHooten, Jr. M.D.

## 2018-10-06 NOTE — Progress Notes (Signed)
IVF initiated approx 30 minutes ago. Prior to infusion BL lung sounds noted to be diminished. Now BL lung sounds coarse. IVF stopped due to patient's acute change in condition. Prime doc paged. Waiting on callback. Will continue to monitor patient.

## 2018-10-06 NOTE — H&P (Signed)
[facility] [place] [facility]   PATIENT NAME: Gary Thomas    MR#:  [id number]  DATE OF BIRTH:  [birth year]  DATE OF ADMISSION:  10/06/2018  PRIMARY CARE PHYSICIAN: Marguarite ArbourSparks, [name], MD   REQUESTING/REFERRING PHYSICIAN: Dr. Minna AntisKevin Paduchowski  CHIEF COMPLAINT:   Chief Complaint  Patient presents with  . Respiratory Distress    HISTORY OF PRESENT ILLNESS:  Gary Thomas  is a 84 y.o. male with a known history of hypertension, CVA, GERD, diabetes mellitus type 2 comes in because of shortness of breath and also low-grade fever.  Patient noted to have purulent urine obtained from urostomy bag.  Patient has urostomy bag for the past 10 years because of history of bladder cancer.  Patient has strong smell of the urine, noted to have decreased urine output.  Not on oxygen at home but O2 sats 88% on room air when he came.  On 2 L of oxygen and sats are 95%.  He denies any shortness of breath, cough.  His main complaint is fever, decreased urine output, foul-smelling from the urostomy bag.  PAST MEDICAL HISTORY:   Past Medical History:  Diagnosis Date  . Depression   . Diabetes mellitus without complication (HCC)   . Dyspnea   . Elevated lipids   . Femur fracture (HCC) 08/22/2018  . GERD (gastroesophageal reflux disease)   . Hypertension   . Pulmonary emboli (HCC)   . Renal insufficiency    only has 1 kidney  . Sleep apnea   . Stroke (HCC)    Rt side weakness    PAST SURGICAL HISTOIRY:   Past Surgical History:  Procedure Laterality Date  . 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 AORTA SURGERY    . CAROTID ARTERY - SUBCLAVIAN ARTERY BYPASS GRAFT Bilateral   . CATARACT EXTRACTION W/ INTRAOCULAR LENS  IMPLANT, BILATERAL    . CHOLECYSTECTOMY    . EYE SURGERY    . FEMUR FRACTURE SURGERY Right   . HIP FRACTURE SURGERY Bilateral   . JOINT REPLACEMENT    . ORIF FEMUR FRACTURE Right 08/24/2018   Procedure:  OPEN REDUCTION INTERNAL FIXATION (ORIF) SUPRACONDYLAR FEMUR FRACTURE;  Surgeon: Christena FlakePoggi, John J, MD;  Location: [facility] ORS;  Service: Orthopedics;  Laterality: Right;  . PROSTATE SURGERY    . REPLACEMENT TOTAL KNEE Right   . TOTAL HIP ARTHROPLASTY Right 03/02/2017   Procedure: TOTAL HIP ARTHROPLASTY ANTERIOR APPROACH;  Surgeon: Kennedy BuckerMenz, Michael, MD;  Location: [facility] ORS;  Service: Orthopedics;  Laterality: Right;  Marland Kitchen. VASCULAR SURGERY      SOCIAL HISTORY:   Social History   Tobacco Use  . Smoking status: Former Smoker    Packs/day: 1.00    Years: 7.00    Pack years: 7.00    Types: Cigarettes    Last attempt to quit: 02/18/1952    Years since quitting: 66.6  . Smokeless tobacco: Former Engineer, waterUser  Substance Use Topics  . Alcohol use: No    FAMILY HISTORY:   Family History  Problem Relation Age of Onset  . Heart attack Father   . Stroke Father   . Heart disease Father   . Heart attack Mother     DRUG ALLERGIES:   Allergies  Allergen Reactions  . Codeine Anaphylaxis  . Morphine Anaphylaxis  . Gabapentin Other (See Comments)    Unsure of what the reaction was   . Stadol [Butorphanol]     Unknown  .  Lyrica [Pregabalin] Rash  . Tapentadol Nausea Only    ONSET 01/04/2013    REVIEW OF SYSTEMS:  CONSTITUTIONAL: Low-grade fever, generalized weakness. EYES: No blurred or double vision.  EARS, NOSE, AND THROAT: No tinnitus or ear pain.  RESPIRATORY: No cough, shortness of breath, wheezing or hemoptysis.  CARDIOVASCULAR: No chest pain, orthopnea, edema.  GASTROINTESTINAL: No nausea, vomiting, diarrhea or abdominal pain.  GENITOURINARY: No dysuria, hematuria.  ENDOCRINE: No polyuria, nocturia,  HEMATOLOGY: No anemia, easy bruising or bleeding SKIN: No rash or lesion. MUSCULOSKELETAL: History of distal femur fracture, f status post repair. Ollowed by Dr. Carmell Austria, supposed to see Dr. Joice Lofts  in the office next week.  Patient is out of rehab for 2 weeks.  Now has right leg immobilizer  present. NEUROLOGIC: No tingling, numbness, weakness.  PSYCHIATRY: No anxiety or depression.   MEDICATIONS AT HOME:   Prior to Admission medications   Medication Sig Start Date End Date Taking? Authorizing Provider  acetaminophen (TYLENOL) 325 MG tablet Take 2 tablets (650 mg total) by mouth every 6 (six) hours as needed for mild pain (or Fever >/= 101). 08/30/18  Yes Gouru, Deanna Artis, MD  albuterol (PROVENTIL) (2.5 MG/3ML) 0.083% nebulizer solution Take 3 mLs (2.5 mg total) by nebulization every 4 (four) hours as needed for wheezing or shortness of breath. 09/14/18  Yes Sharee Holster, NP  allopurinol (ZYLOPRIM) 100 MG tablet Take 0.5 tablets (50 mg total) by mouth every other day. 09/14/18  Yes Sharee Holster, NP  atorvastatin (LIPITOR) 20 MG tablet Take 1 tablet (20 mg total) by mouth at bedtime. 09/14/18  Yes Sharee Holster, NP  budesonide (PULMICORT) 0.5 MG/2ML nebulizer solution Take 2 mLs (0.5 mg total) by nebulization 2 (two) times daily. 09/14/18  Yes Sharee Holster, NP  ferrous sulfate 325 (65 FE) MG EC tablet Take 1 tablet (325 mg total) by mouth 3 (three) times daily with meals. 09/14/18  Yes Sharee Holster, NP  furosemide (LASIX) 40 MG tablet Take 1 tablet (40 mg total) by mouth at bedtime. 09/14/18  Yes Sharee Holster, NP  Infant Care Products [place] [facility] EX) Apply liberal amount topically as needed to area of skin irritation.  Ok to leave at bedside 09/02/18  Yes [provider]  ipratropium-albuterol (DUONEB) 0.5-2.5 (3) MG/3ML SOLN Take 3 mLs by nebulization 2 (two) times daily. 09/14/18  Yes Sharee Holster, NP  loperamide (IMODIUM A-D) 2 MG tablet Take 2 mg by mouth 4 (four) times daily as needed for diarrhea or loose stools. 09/07/18  Yes [provider]  losartan (COZAAR) 25 MG tablet Take 1 tablet (25 mg total) by mouth at bedtime. 09/14/18  Yes Sharee Holster, NP  metoprolol succinate (TOPROL-XL) 100 MG 24 hr tablet Take 1 tablet (100 mg total) by mouth  at bedtime. 09/14/18  Yes Sharee Holster, NP  mirtazapine (REMERON) 15 MG tablet Take 15 mg by mouth at bedtime.   Yes [provider]  multivitamin-lutein (OCUVITE-LUTEIN) CAPS capsule Take 1 capsule by mouth daily.   Yes [provider]  NOVOLOG MIX 70/30 FLEXPEN (70-30) 100 UNIT/ML FlexPen Inject 0.25 mLs (25 Units total) into the skin daily. Patient taking differently: Inject 25 Units into the skin 2 (two) times daily with a meal.  09/14/18  Yes Sharee Holster, NP  nystatin (MYCOSTATIN/NYSTOP) powder Apply topically as directed. Apply with each ostomy change as instructed   Yes [provider]  omeprazole (PRILOSEC) 40 MG capsule Take 1 capsule (40  mg total) by mouth daily. 09/14/18  Yes Sharee Holster, NP  XARELTO 15 MG TABS tablet Take 1 tablet (15 mg total) by mouth at bedtime. 09/14/18  Yes Sharee Holster, NP  Cholecalciferol 100 MCG (4000 UT) CAPS Take 1 capsule by mouth daily. 08/31/18   [provider]  NON FORMULARY Diet Type: NAS. NCS    [provider]  sertraline (ZOLOFT) 100 MG tablet Take 1 tablet (100 mg total) by mouth at bedtime. 09/14/18   Sharee Holster, NP  tiotropium (SPIRIVA) 18 MCG inhalation capsule Place 18 mcg into inhaler and inhale daily. 08/30/18   [provider]  traMADol (ULTRAM) 50 MG tablet Take 1 tablet (50 mg total) by mouth every 6 (six) hours as needed for moderate pain. 09/14/18   Sharee Holster, NP      VITAL SIGNS:  Blood pressure (!) 115/48, pulse 82, temperature 98 F (36.7 C), temperature source Oral, resp. rate (!) 24, weight 89.4 kg, SpO2 97 %.  PHYSICAL EXAMINATION:  GENERAL:  84 y.o.-year-old patient lying in the bed with no acute distress.  EYES: Pupils equal, round, reactive to light . No scleral icterus. Extraocular muscles intact.  HEENT: Head atraumatic, normocephalic. Oropharynx and nasopharynx clear.  NECK:  Supple, no jugular venous distention. No thyroid enlargement, no  tenderness.  LUNGS: Normal breath sounds bilaterally, no wheezing, rales,rhonchi or crepitation. No use of accessory muscles of respiration.  CARDIOVASCULAR: S1, S2 normal. No murmurs, rubs, or gallops.  ABDOMEN: Soft, nontender, nondistended. Bowel sounds present. No organomegaly or mass.  Patient has urostomy bag in the right lower quadrant lots of pus, stoma site also is having purulent surrounding that. EXTREMITIES: Right leg is in brace.  NEUROLOGIC: Cranial nerves II through XII are intact. Muscle strength 5/5 in all extremities. Sensation intact. Gait not checked.  PSYCHIATRIC: The patient is alert and oriented x 3.  SKIN: No obvious rash, lesion, or ulcer.   LABORATORY PANEL:   CBC Recent Labs  Lab 10/06/18 1118  WBC 19.7*  HGB 10.5*  HCT 35.0*  PLT 186   ------------------------------------------------------------------------------------------------------------------  Chemistries  Recent Labs  Lab 10/06/18 1118  NA 137  K 4.6  CL 101  CO2 26  GLUCOSE 119*  BUN 47*  CREATININE 2.61*  CALCIUM 8.9  AST 25  ALT 14  ALKPHOS 69  BILITOT 0.7   ------------------------------------------------------------------------------------------------------------------  Cardiac Enzymes Recent Labs  Lab 10/06/18 1118  TROPONINI 0.06*   ------------------------------------------------------------------------------------------------------------------  RADIOLOGY:  Dg Chest 2 View  Result Date: 10/06/2018 CLINICAL DATA:  Difficulty breathing today, recent femoral surgery, some nausea and vomiting yesterday EXAM: CHEST - 2 VIEW COMPARISON:  Portable chest x-ray of 08/28/2018 FINDINGS: There is chronic elevation of the right hemidiaphragm with resultant right basilar linear atelectasis. No definite pneumonia or pleural effusion is seen. There is some peribronchial thickening which could indicate bronchitis. Mediastinal and hilar contours are unremarkable and heart size is stable.  Clips are noted from prior thyroidectomy. No acute bony abnormality is seen. IMPRESSION: 1. Peribronchial thickening may indicate bronchitis. 2. Chronic elevation of the right hemidiaphragm with resultant right basilar linear atelectasis. Electronically Signed   By: Dwyane Dee M.D.   On: 10/06/2018 11:40    EKG:   Orders placed or performed during the hospital encounter of 10/06/18  . ED EKG 12-Lead  . ED EKG 12-Lead  . EKG 12-Lead  . EKG 12-Lead  EKG shows normal sinus rhythm at 91 bpm, no ST-T changes.  IMPRESSION AND  PLAN:   84 year old male patient with multiple medical problems of diabetes mellitus type 2, depression, COPD, hypertension with recent right knee surgery just discharged from rehab 2 weeks ago comes from home because of fever, foul-smelling urine.  Sepsis present on admission with evidence of elevated lactic acid of 2.7, low-grade fever at home, admitted white count up to 19.7, patient found to have UTI, urostomy bag filled with pus, start aggressive IV antibiotics with Zosyn, obtain urine cultures, will also obtain urology consult.  Wound care nurse for ostomy care. 2.  Possible wound dehiscence from the right knee, consult orthopedic, Dr. Ernest PineHooten has been found.  Physician.  History of nondisplaced supracondylar fracture of distal end of right femur, status post ORIF by Dr. Joice LoftsPoggi on November 13.Marland Kitchen.  Now we feel like he has wound dehiscence.  We will consult orthopedic physician.  #3 COPD: No wheezing now, slightly hypoxic, check ambulatory oxygen saturation once stable from orthopedic standpoint of view. 4.  Acute on chronic renal failure, CKD stage III.  Baseline creatinine around 1.731 on November 19, today creatinine is 2.61 with GFR decreased from 34-22.  Likely due to sepsis.  Continue IV fluids, avoid nephrotoxic agents.  #5 diabetes mellitus type 2: #6 history of PE, continue Xarelto Discussed with wife, patient daughter at bedside.  #7 ;essential hypertension,  continue Toprol but hold losartan.. All the records are reviewed and case discussed with ED provider. Management plans discussed with the patient, family and they are in agreement.  CODE STATUS: Full code  TOTAL TIME TAKING CARE OF THIS PATIENT: 55 minutes.    Katha HammingSnehalatha Angelina Neece M.D on 10/06/2018 at 4:18 PM  Between 7am to 6pm - Pager - [phone]  After 6pm go to [email or web address] - password [email or web address] [facility]  [facility]  Office  [phone]  CC: Primary care physician; Marguarite ArbourSparks, [name], MD  Note: This dictation was prepared with Dragon dictation along with smaller phrase technology. Any transcriptional errors that result from this process are unintentional.

## 2018-10-06 NOTE — ED Notes (Signed)
Patient transported to X-ray 

## 2018-10-06 NOTE — Progress Notes (Signed)
Prime Doc informed of acute change in patient's respiratory condition and previously stopping IVF. Patient had stated to myself that he has no known history of CHF. MD informed of patient statement. MD ordered portable CXR STAT to assess for any changes. Will await results from CXR and further orders from MD. Will cont to monitor patient.

## 2018-10-06 NOTE — Consult Note (Signed)
CODE SEPSIS - PHARMACY COMMUNICATION  **Broad Spectrum Antibiotics should be administered within 1 hour of Sepsis diagnosis**  Time Code Sepsis Called/Page Received: 1249  Antibiotics Ordered: ceftriaxone  Time of 1st antibiotic administration: 1339  Additional action taken by pharmacy: none required  If necessary, Name of Provider/Nurse Contacted: N/A    Lowella Bandyodney D Nikitta Sobiech ,PharmD Clinical Pharmacist  10/06/2018  1:43 PM

## 2018-10-06 NOTE — ED Triage Notes (Addendum)
Arrives via [facility] for c/o difficulty breathing this am.  Per EMS patient recently had femur surgery to right femur and has been in bed more than usual.  Patient also had a few episodes of N/V yesterday and reports a fever yesterday.  Patient has a urostomy and states he noticed decreased urine output overnight and urine smelling strongly this morning.  EMS reports that initial RA sats 88%, placed on 2l/[place] and sats improved to 94%.

## 2018-10-06 NOTE — ED Provider Notes (Addendum)
[facility] Emergency Department Provider Note  Time seen: 11:23 AM  I have reviewed the triage vital signs and the nursing notes.   HISTORY  Chief Complaint Respiratory Distress    HPI Gary Thomas is a 84 y.o. male with a past medical history of diabetes, gastric reflux, hypertension, CVA, presents to the emergency department for nausea vomiting cough and reported fever.  According to EMS family states the patient has had nausea and vomiting with a low-grade fever since yesterday.  Patient has noticed decreased urine output from his urostomy as well as a strong urine smell to the urine today.  EMS states upon arrival patient noted to have a room air saturation of 88% with no home O2 requirement.  Placed on 2 L nasal cannula.  Here the patient continues to maintain 95% O2 on 2 L nasal cannula.  States he has been coughing for the past several days.  Currently afebrile with otherwise reassuring vitals.  Denies any chest pain or abdominal pain.  States vomiting but denies diarrhea.   Past Medical History:  Diagnosis Date  . Depression   . Diabetes mellitus without complication (HCC)   . Dyspnea   . Elevated lipids   . Femur fracture (HCC) 08/22/2018  . GERD (gastroesophageal reflux disease)   . Hypertension   . Pulmonary emboli (HCC)   . Renal insufficiency    only has 1 kidney  . Sleep apnea   . Stroke (HCC)    Rt side weakness    Patient Active Problem List   Diagnosis Date Noted  . Chronic pulmonary embolism (HCC) 09/06/2018  . Primary osteoarthritis of right knee 09/06/2018  . Status post right knee replacement 09/06/2018  . Dyslipidemia associated with type 2 diabetes mellitus (HCC) 09/06/2018  . CKD stage 3 due to type 2 diabetes mellitus (HCC) 09/06/2018  . Controlled type 2 diabetes mellitus with stage 3 chronic kidney disease, with long-term current use of insulin (HCC) 09/06/2018  . Chronic cerebrovascular accident (CVA) 09/06/2018  .  GERD without esophagitis 09/06/2018  . Closed nondisplaced supracondylar fracture of distal end of femur without intracondylar extension (HCC) 09/06/2018  . Acute on chronic anemia 09/06/2018  . Depression, major, single episode, in partial remission (HCC) 09/06/2018  . Bilateral lower extremity edema 09/06/2018  . Chronic gout due to renal impairment 09/06/2018  . COPD with respiratory failure, acute (HCC) 08/28/2018  . Femur fracture (HCC) 08/22/2018  . Spondylolisthesis of lumbar region 02/15/2018  . PAD (peripheral artery disease) (HCC) 08/19/2017  . Hyperlipidemia 07/05/2017  . Essential hypertension 07/05/2017  . Bilateral carotid artery stenosis 07/05/2017  . Primary localized osteoarthritis of right hip 03/02/2017    Past Surgical History:  Procedure Laterality Date  . 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 AORTA SURGERY    . CAROTID ARTERY - SUBCLAVIAN ARTERY BYPASS GRAFT Bilateral   . CATARACT EXTRACTION W/ INTRAOCULAR LENS  IMPLANT, BILATERAL    . CHOLECYSTECTOMY    . EYE SURGERY    . FEMUR FRACTURE SURGERY Right   . HIP FRACTURE SURGERY Bilateral   . JOINT REPLACEMENT    . ORIF FEMUR FRACTURE Right 08/24/2018   Procedure: OPEN REDUCTION INTERNAL FIXATION (ORIF) SUPRACONDYLAR FEMUR FRACTURE;  Surgeon: Christena FlakePoggi, John J, MD;  Location: [facility] ORS;  Service: Orthopedics;  Laterality: Right;  . PROSTATE SURGERY    . REPLACEMENT TOTAL KNEE Right   . TOTAL HIP ARTHROPLASTY Right 03/02/2017   Procedure: TOTAL  HIP ARTHROPLASTY ANTERIOR APPROACH;  Surgeon: Kennedy BuckerMenz, Michael, MD;  Location: [facility] ORS;  Service: Orthopedics;  Laterality: Right;  Marland Kitchen. VASCULAR SURGERY      Prior to Admission medications   Medication Sig Start Date End Date Taking? Authorizing Provider  acetaminophen (TYLENOL) 325 MG tablet Take 2 tablets (650 mg total) by mouth every 6 (six) hours as needed for mild pain (or Fever >/= 101). 08/30/18   Gouru, Deanna ArtisAruna, MD  albuterol  (PROVENTIL) (2.5 MG/3ML) 0.083% nebulizer solution Take 3 mLs (2.5 mg total) by nebulization every 4 (four) hours as needed for wheezing or shortness of breath. 09/14/18   Sharee HolsterGreen, Deborah S, NP  allopurinol (ZYLOPRIM) 100 MG tablet Take 0.5 tablets (50 mg total) by mouth every other day. 09/14/18   Sharee HolsterGreen, Deborah S, NP  atorvastatin (LIPITOR) 20 MG tablet Take 1 tablet (20 mg total) by mouth at bedtime. 09/14/18   Sharee HolsterGreen, Deborah S, NP  budesonide (PULMICORT) 0.5 MG/2ML nebulizer solution Take 2 mLs (0.5 mg total) by nebulization 2 (two) times daily. 09/14/18   Sharee HolsterGreen, Deborah S, NP  Cholecalciferol 100 MCG (4000 UT) CAPS Take 1 capsule by mouth daily. 08/31/18   [provider]  ferrous sulfate 325 (65 FE) MG EC tablet Take 1 tablet (325 mg total) by mouth 3 (three) times daily with meals. 09/14/18   Sharee HolsterGreen, Deborah S, NP  furosemide (LASIX) 40 MG tablet Take 1 tablet (40 mg total) by mouth at bedtime. 09/14/18   Sharee HolsterGreen, Deborah S, NP  [employer] [facility](DERMACLOUD EX) Apply liberal amount topically as needed to area of skin irritation.  Ok to leave at bedside 09/02/18   [provider]  ipratropium-albuterol (DUONEB) 0.5-2.5 (3) MG/3ML SOLN Take 3 mLs by nebulization 2 (two) times daily. 09/14/18   Sharee HolsterGreen, Deborah S, NP  loperamide (IMODIUM A-D) 2 MG tablet Take 2 mg by mouth 4 (four) times daily as needed for diarrhea or loose stools. 09/07/18   [provider]  losartan (COZAAR) 25 MG tablet Take 1 tablet (25 mg total) by mouth at bedtime. 09/14/18   Sharee HolsterGreen, Deborah S, NP  metoprolol succinate (TOPROL-XL) 100 MG 24 hr tablet Take 1 tablet (100 mg total) by mouth at bedtime. 09/14/18   Sharee HolsterGreen, Deborah S, NP  NON FORMULARY Diet Type: NAS. NCS    [provider]  NOVOLOG MIX 70/30 FLEXPEN (70-30) 100 UNIT/ML FlexPen Inject 0.25 mLs (25 Units total) into the skin daily. 09/14/18   Sharee HolsterGreen, Deborah S, NP  omeprazole (PRILOSEC) 40 MG capsule Take 1 capsule (40 mg total) by mouth daily.  09/14/18   Sharee HolsterGreen, Deborah S, NP  sertraline (ZOLOFT) 100 MG tablet Take 1 tablet (100 mg total) by mouth at bedtime. 09/14/18   Sharee HolsterGreen, Deborah S, NP  tiotropium (SPIRIVA) 18 MCG inhalation capsule Place 18 mcg into inhaler and inhale daily. 08/30/18   [provider]  traMADol (ULTRAM) 50 MG tablet Take 1 tablet (50 mg total) by mouth every 6 (six) hours as needed for moderate pain. 09/14/18   Sharee HolsterGreen, Deborah S, NP  XARELTO 15 MG TABS tablet Take 1 tablet (15 mg total) by mouth at bedtime. 09/14/18   Sharee HolsterGreen, Deborah S, NP    Allergies  Allergen Reactions  . Codeine Anaphylaxis  . Morphine Anaphylaxis  . Gabapentin Other (See Comments)    Unsure of what the reaction was   . Stadol [Butorphanol]     Unknown  . Lyrica [Pregabalin] Rash  . Tapentadol Nausea Only    ONSET  01/04/2013    Family History  Problem Relation Age of Onset  . Heart attack Father   . Stroke Father   . Heart disease Father   . Heart attack Mother     Social History Social History   Tobacco Use  . Smoking status: Former Smoker    Packs/day: 1.00    Years: 7.00    Pack years: 7.00    Types: Cigarettes    Last attempt to quit: 02/18/1952    Years since quitting: 66.6  . Smokeless tobacco: Former Engineer, water Use Topics  . Alcohol use: No  . Drug use: No    Review of Systems Constitutional: Low-grade fever since yesterday per EMS report Cardiovascular: Negative for chest pain. Respiratory: Negative for shortness of breath.  Positive for cough Gastrointestinal: Negative for abdominal pain.  Positive for nausea vomiting. Genitourinary: Decreased urostomy output with strong urine smell. Musculoskeletal: Recent right leg surgery currently in a knee immobilizer Skin: Negative for skin complaints  Neurological: Negative for headache All other ROS negative  ____________________________________________   PHYSICAL EXAM:  VITAL SIGNS: ED Triage Vitals  Enc Vitals Group     BP 10/06/18 1114  126/69     Pulse Rate 10/06/18 1114 91     Resp 10/06/18 1114 16     Temp 10/06/18 1114 98 F (36.7 C)     Temp Source 10/06/18 1114 Oral     SpO2 10/06/18 1114 96 %     Weight 10/06/18 1115 197 lb 1.5 oz (89.4 kg)     Height --      Head Circumference --      Peak Flow --      Pain Score 10/06/18 1115 0     Pain Loc --      Pain Edu? --      Excl. in GC? --    Constitutional: Alert. Well appearing and in no distress. Eyes: Normal exam ENT   Head: Normocephalic and atraumatic   Mouth/Throat: Mucous membranes are moist. Cardiovascular: Normal rate, regular rhythm.  Respiratory: Normal respiratory effort without tachypnea nor retractions.  Slight rhonchi bilaterally.  Occasional cough. Gastrointestinal: Soft and nontender. No distention.   Musculoskeletal: Right lower extremity remains in a knee immobilizer.  Appears neurovascular intact distally. Neurologic:  Normal speech and language. No gross focal neurologic deficits Skin:  Skin is warm, dry and intact.  Psychiatric: Mood and affect are normal.   ____________________________________________    EKG  EKG viewed and interpreted by myself shows a normal sinus rhythm at 91 bpm, normal sinus rhythm, narrow QRS, normal axis, normal intervals, nonspecific but no concerning ST changes.  ____________________________________________    RADIOLOGY  Chest x-ray negative  ____________________________________________   INITIAL IMPRESSION / ASSESSMENT AND PLAN / ED COURSE  Pertinent labs & imaging results that were available during my care of the patient were reviewed by me and considered in my medical decision making (see chart for details).  Patient presents to the emergency department for nausea vomiting low-grade fever since yesterday.  Recent right femur surgery currently in a knee immobilizer.  Differential this time would include pneumonia, pneumothorax, ACS, pulmonary embolism.  Patient is hypoxic on room air we  will continue on 2 L nasal cannula.  We will dose nausea medication, IV fluids and continue to closely monitor while awaiting lab results.  EKG is reassuring.  Patient's work-up shows significant leukocytosis of 19,000, urine is concerning for urinary infection however this is from a chronic urostomy.  Patient does state he has had drainage from the right leg incision.  I remove the patient's knee immobilizer and evaluated the incision he does have a small amount approximately 1 to 1.5 cm of dehiscence with minimal drainage from this area but moderate tenderness to this area.  We will discuss with orthopedics.  Given the patient's knee immobilizer and minimal hypoxia pulmonary embolism still remains in the differential although creatinine would not allow CT angiography.  We will continue to closely monitor.  I will discuss with the hospitalist as the patient meets sepsis criteria he will receive IV antibiotics and will be admitted to the hospital service for further treatment.  Patient and family agreeable to this plan of care.  I was able to discuss the patient with Dr. Ernest Pine of orthopedics who will see the patient in consultation.   CRITICAL CARE Performed by: Minna Antis   Total critical care time: 30 minutes  Critical care time was exclusive of separately billable procedures and treating other patients.  Critical care was necessary to treat or prevent imminent or life-threatening deterioration.  Critical care was time spent personally by me on the following activities: development of treatment plan with patient and/or surrogate as well as nursing, discussions with consultants, evaluation of patient's response to treatment, examination of patient, obtaining history from patient or surrogate, ordering and performing treatments and interventions, ordering and review of laboratory studies, ordering and review of radiographic studies, pulse oximetry and re-evaluation of patient's  condition.   ____________________________________________   FINAL CLINICAL IMPRESSION(S) / ED DIAGNOSES  Sepsis Nausea vomiting UTI Hypoxia Wound infection   Minna Antis, MD 10/06/18 1336    Minna Antis, MD 10/06/18 1346

## 2018-10-07 ENCOUNTER — Inpatient Hospital Stay: Payer: Medicare Other

## 2018-10-07 DIAGNOSIS — A419 Sepsis, unspecified organism: Principal | ICD-10-CM

## 2018-10-07 LAB — CBC
HCT: 30.2 % — ABNORMAL LOW (ref 39.0–52.0)
Hemoglobin: 9.1 g/dL — ABNORMAL LOW (ref 13.0–17.0)
MCH: 26 pg (ref 26.0–34.0)
MCHC: 30.1 g/dL (ref 30.0–36.0)
MCV: 86.3 fL (ref 80.0–100.0)
Platelets: 138 10*3/uL — ABNORMAL LOW (ref 150–400)
RBC: 3.5 MIL/uL — ABNORMAL LOW (ref 4.22–5.81)
RDW: 18.3 % — ABNORMAL HIGH (ref 11.5–15.5)
WBC: 12.3 10*3/uL — ABNORMAL HIGH (ref 4.0–10.5)
nRBC: 0 % (ref 0.0–0.2)

## 2018-10-07 LAB — BASIC METABOLIC PANEL
Anion gap: 7 (ref 5–15)
BUN: 47 mg/dL — ABNORMAL HIGH (ref 8–23)
CO2: 24 mmol/L (ref 22–32)
Calcium: 8.4 mg/dL — ABNORMAL LOW (ref 8.9–10.3)
Chloride: 105 mmol/L (ref 98–111)
Creatinine, Ser: 1.95 mg/dL — ABNORMAL HIGH (ref 0.61–1.24)
GFR calc Af Amer: 36 mL/min — ABNORMAL LOW (ref >60)
GFR, EST NON AFRICAN AMERICAN: 31 mL/min — AB (ref >60)
Glucose, Bld: 125 mg/dL — ABNORMAL HIGH (ref 70–99)
Potassium: 4.6 mmol/L (ref 3.5–5.1)
Sodium: 136 mmol/L (ref 135–145)

## 2018-10-07 LAB — URINE CULTURE

## 2018-10-07 LAB — GLUCOSE, CAPILLARY: Glucose-Capillary: 110 mg/dL — ABNORMAL HIGH (ref 70–99)

## 2018-10-07 MED ORDER — IPRATROPIUM-ALBUTEROL 0.5-2.5 (3) MG/3ML IN SOLN
3.0000 mL | Freq: Four times a day (QID) | RESPIRATORY_TRACT | Status: DC
  Administered 2018-10-07 – 2018-10-09 (×9): 3 mL via RESPIRATORY_TRACT
  Filled 2018-10-07 (×9): qty 3

## 2018-10-07 MED ORDER — BUDESONIDE 0.5 MG/2ML IN SUSP
0.5000 mg | Freq: Two times a day (BID) | RESPIRATORY_TRACT | Status: DC
  Administered 2018-10-07 – 2018-10-10 (×6): 0.5 mg via RESPIRATORY_TRACT
  Filled 2018-10-07 (×6): qty 2

## 2018-10-07 MED ORDER — LORAZEPAM 2 MG/ML IJ SOLN: 2.0000 mg | Freq: Once | INTRAMUSCULAR | Status: DC

## 2018-10-07 NOTE — Progress Notes (Signed)
Subjective:   S/P ORIF of right distal femur, DOS 08/24/18 Admitted for pneumonia and right incision drainage. Patient reports pain in the right femur as a 5 out of 10 Patient reports that his cough is minimally improved today. Dressing applied by Dr. Ernest Thomas to the right leg yesterday, this morning there is moderate serosanguinous drainage. Negative for chest pain and shortness of breath Fever: Most recent temp 98.6 Gastrointestinal:Negative for nausea and vomiting  Objective: Vital signs in last 24 hours: Temp:  [98 F (36.7 C)-98.1 F (36.7 C)] 98.1 F (36.7 C) (12/27 0654) Pulse Rate:  [79-92] 79 (12/27 0654) Resp:  [14-24] 19 (12/27 0654) BP: (107-131)/(42-69) 120/54 (12/27 0654) SpO2:  [94 %-99 %] 94 % (12/27 0738) Weight:  [89.4 kg-93.3 kg] 93.3 kg (12/27 0443)  Intake/Output from previous day:  Intake/Output Summary (Last 24 hours) at 10/07/2018 0803 Last data filed at 10/07/2018 0729 Gross per 24 hour  Intake 1127.06 ml  Output 650 ml  Net 477.06 ml    Intake/Output this shift: Total I/O In: 27.1 [I.V.:27.1] Out: -   Labs: Recent Labs    10/06/18 1118 10/07/18 0339  HGB 10.5* 9.1*   Recent Labs    10/06/18 1118 10/07/18 0339  WBC 19.7* 12.3*  RBC 4.07* 3.50*  HCT 35.0* 30.2*  PLT 186 138*   Recent Labs    10/06/18 1118 10/07/18 0339  NA 137 136  K 4.6 4.6  CL 101 105  CO2 26 24  BUN 47* 47*  CREATININE 2.61* 1.95*  GLUCOSE 119* 125*  CALCIUM 8.9 8.4*   No results for input(s): LABPT, INR in the last 72 hours.   EXAM General - Patient is Alert, Appropriate and Oriented Extremity - Patient remains in a knee ROM brace today locked in extension. Dressing removed this AM, there is a area about 1 cm in length that has opened at the distal aspect of the incision.  No significant swelling or erythema on today's exam.  Moderate drainage as mentioned above, applied a sterile wet-to-dry dressing this AM.  Padded dressing was left intact over the  area of the previous blister which appears well-healed.  Patient was placed back in the knee immobilizer following dressing change. Motor Function - intact, moving foot and toes well on exam.   Past Medical History:  Diagnosis Date  . Depression   . Diabetes mellitus without complication (HCC)   . Dyspnea   . Elevated lipids   . Femur fracture (HCC) 08/22/2018  . GERD (gastroesophageal reflux disease)   . Hypertension   . Pulmonary emboli (HCC)   . Renal insufficiency    only has 1 kidney  . Sleep apnea   . Stroke (HCC)    Rt side weakness    Assessment/Plan:    Active Problems:   Sepsis (HCC)  Estimated body mass index is 30.38 kg/m as calculated from the following:   Height as of this encounter: 5\' 9"  (1.753 m).   Weight as of this encounter: 93.3 kg. Advance diet   Continue treatment for pneumonia. Wet-to-dry dressing applied today, will plan on dressing change again tomorrow. Upon discharge will need home health orders for nursing for continued dressing changes until wound is healed. Continue non-weightbearing status. Patient has outpatient appointment scheduled for Monday, keep this appointment for now.  DVT Prophylaxis - Xarelto and TED hose  J. Horris LatinoLance [name], PA-C [facility] 10/07/2018, 8:03 AM

## 2018-10-07 NOTE — Care Management Note (Signed)
Case Management Note  Patient Details  Name: Abran DukeDanford R Konicek MRN: [id number] Date of Birth: 1932/01/13  Subjective/Objective:                   RNCM spoke with patient and his wife to discuss discharge planning.  He is followed by [facility] post surgery with Dr. Joice LoftsPoggi.  He ambulates at baseline with a rolling walker. He has a urostomy in place that he manages on his own.  She states he is non-weightbearing and will need EMS to home.  Action/Plan:   Rosey Batheresa with [facility] has been notified.   Expected Discharge Date:                  Expected Discharge Plan:     In-House Referral:     Discharge planning Services  CM Consult  Post Acute Care Choice:  Home Health, Resumption of Svcs/PTA Provider Choice offered to:  Patient  DME Arranged:    DME Agency:     HH Arranged:  PT [facility]  Status of Service:  In process, will continue to follow  If discussed at Long Length of Stay Meetings, dates discussed:    Additional Comments:  Collie Siadngela Charvi Gammage, RN 10/07/2018, 4:02 PM

## 2018-10-07 NOTE — Progress Notes (Signed)
Dr Sheryle Hailiamond notified of acute change in respiratory status. No new orders.

## 2018-10-07 NOTE — Progress Notes (Addendum)
Pt is confused this afternoon. Disoriented to place. [place] the [place]. On 2 L O2 acute. Per his wife, "this is how he gets with pain medication". Will hold tramadol per family request. Pt is symmetrical. Speech is clear.

## 2018-10-07 NOTE — NC FL2 (Addendum)
[place] MEDICAID FL2 LEVEL OF CARE SCREENING TOOL     IDENTIFICATION  Patient Name: Gary Thomas Birthdate: 10/05/1932 Sex: male Admission Date (Current Location): 10/06/2018  [place]   Facility and Address:  [facility], [address], [place], [place] [address]      Provider Number: [id number]  Attending Physician Name and Address:  Campbell StallMayo, Katy Dodd, MD  Relative Name and Phone Number:       Current Level of Care: Hospital Recommended Level of Care: Skilled Nursing Facility Prior Approval Number:    Date Approved/Denied:   PASRR Number: ([id number])  Discharge Plan: SNF    Current Diagnoses: Patient Active Problem List   Diagnosis Date Noted  . Sepsis (HCC) 10/06/2018  . Chronic pulmonary embolism (HCC) 09/06/2018  . Primary osteoarthritis of right knee 09/06/2018  . Status post right knee replacement 09/06/2018  . Dyslipidemia associated with type 2 diabetes mellitus (HCC) 09/06/2018  . CKD stage 3 due to type 2 diabetes mellitus (HCC) 09/06/2018  . Controlled type 2 diabetes mellitus with stage 3 chronic kidney disease, with long-term current use of insulin (HCC) 09/06/2018  . Chronic cerebrovascular accident (CVA) 09/06/2018  . GERD without esophagitis 09/06/2018  . Closed nondisplaced supracondylar fracture of distal end of femur without intracondylar extension (HCC) 09/06/2018  . Acute on chronic anemia 09/06/2018  . Depression, major, single episode, in partial remission (HCC) 09/06/2018  . Bilateral lower extremity edema 09/06/2018  . Chronic gout due to renal impairment 09/06/2018  . COPD with respiratory failure, acute (HCC) 08/28/2018  . Femur fracture (HCC) 08/22/2018  . Spondylolisthesis of lumbar region 02/15/2018  . PAD (peripheral artery disease) (HCC) 08/19/2017  . Hyperlipidemia 07/05/2017  . Essential hypertension 07/05/2017  . Bilateral carotid artery stenosis 07/05/2017  .  Primary localized osteoarthritis of right hip 03/02/2017    Orientation RESPIRATION BLADDER Height & Weight     Self, Time, Situation, Place  O2(2 Liters Oxygen. ) Urostomy  Weight: 205 lb 11 oz (93.3 kg) Height:  5\' 9"  (175.3 cm)  BEHAVIORAL SYMPTOMS/MOOD NEUROLOGICAL BOWEL NUTRITION STATUS      Continent Diet(Diet: Heart Healthy/ Carb Modified. )  AMBULATORY STATUS COMMUNICATION OF NEEDS Skin   Extensive Assist Verbally Normal                       Personal Care Assistance Level of Assistance  Bathing, Feeding, Dressing Bathing Assistance: Limited assistance Feeding assistance: Independent Dressing Assistance: Limited assistance     Functional Limitations Info  Sight, Hearing, Speech Sight Info: Adequate Hearing Info: Adequate Speech Info: Adequate    SPECIAL CARE FACTORS FREQUENCY  PT (By licensed PT), OT (By licensed OT)     PT Frequency: (5) OT Frequency: (5)            Contractures      Additional Factors Info  Code Status, Allergies Code Status Info: (Full Code. ) Allergies Info: (Codeine, Morphine, Gabapentin, Stadol Butorphanol, Lyrica Pregabalin, Tapentadol)           Current Medications (10/07/2018):  This is the current hospital active medication list Current Facility-Administered Medications  Medication Dose Route Frequency Provider Last Rate Last Dose  . 0.9 %  sodium chloride infusion   Intravenous Continuous Katha HammingKonidena, Snehalatha, MD   Stopped at 10/06/18 2120  . acetaminophen (TYLENOL) tablet 650 mg  650 mg Oral Q6H PRN Katha HammingKonidena, Snehalatha, MD   650 mg at 10/07/18 0117   Or  .  acetaminophen (TYLENOL) suppository 650 mg  650 mg Rectal Q6H PRN Katha HammingKonidena, Snehalatha, MD      . albuterol (PROVENTIL) (2.5 MG/3ML) 0.083% nebulizer solution 2.5 mg  2.5 mg Nebulization Q4H PRN Katha HammingKonidena, Snehalatha, MD      . allopurinol (ZYLOPRIM) tablet 50 mg  50 mg Oral Q48H Katha HammingKonidena, Snehalatha, MD   50 mg at 10/07/18 0849  . atorvastatin (LIPITOR) tablet 20  mg  20 mg Oral QHS Katha HammingKonidena, Snehalatha, MD   20 mg at 10/06/18 2105  . bisacodyl (DULCOLAX) EC tablet 5 mg  5 mg Oral Daily PRN Katha HammingKonidena, Snehalatha, MD      . budesonide (PULMICORT) nebulizer solution 0.5 mg  0.5 mg Nebulization BID Katha HammingKonidena, Snehalatha, MD      . cefTRIAXone (ROCEPHIN) 1 g in sodium chloride 0.9 % 100 mL IVPB  1 g Intravenous Q24H Katha HammingKonidena, Snehalatha, MD   Stopped at 10/07/18 1328  . docusate sodium (COLACE) capsule 100 mg  100 mg Oral BID Katha HammingKonidena, Snehalatha, MD   100 mg at 10/07/18 0850  . ferrous sulfate tablet 325 mg  325 mg Oral TID WC Katha HammingKonidena, Snehalatha, MD   325 mg at 10/07/18 1252  . ipratropium-albuterol (DUONEB) 0.5-2.5 (3) MG/3ML nebulizer solution 3 mL  3 mL Nebulization Q6H Mayo, Allyn KennerKaty Dodd, MD   3 mL at 10/07/18 1335  . metoprolol succinate (TOPROL-XL) 24 hr tablet 100 mg  100 mg Oral QHS Katha HammingKonidena, Snehalatha, MD   100 mg at 10/06/18 2105  . mirtazapine (REMERON) tablet 15 mg  15 mg Oral QHS Katha HammingKonidena, Snehalatha, MD   15 mg at 10/06/18 2105  . ondansetron (ZOFRAN) tablet 4 mg  4 mg Oral Q6H PRN Katha HammingKonidena, Snehalatha, MD       Or  . ondansetron (ZOFRAN) injection 4 mg  4 mg Intravenous Q6H PRN Katha HammingKonidena, Snehalatha, MD      . pantoprazole (PROTONIX) EC tablet 40 mg  40 mg Oral Daily Katha HammingKonidena, Snehalatha, MD   40 mg at 10/07/18 0850  . Rivaroxaban (XARELTO) tablet 15 mg  15 mg Oral QHS Katha HammingKonidena, Snehalatha, MD   15 mg at 10/06/18 2105  . sertraline (ZOLOFT) tablet 100 mg  100 mg Oral QHS Katha HammingKonidena, Snehalatha, MD   100 mg at 10/06/18 2105  . traMADol (ULTRAM) tablet 50 mg  50 mg Oral Q6H PRN Katha HammingKonidena, Snehalatha, MD   50 mg at 10/07/18 0850  . traZODone (DESYREL) tablet 25 mg  25 mg Oral QHS PRN Katha HammingKonidena, Snehalatha, MD   25 mg at 10/07/18 0116     Discharge Medications: Please see discharge summary for a list of discharge medications.  Relevant Imaging Results:  Relevant Lab Results:   Additional Information (SSN: [id number])  Kiosha Buchan, Darleen CrockerBailey M,  LCSW

## 2018-10-07 NOTE — Consult Note (Addendum)
Gary Thomas 16-Jun-1932 [id number]  CC: Possible UTI in setting of urostomy  HPI: I saw Gary Thomas in consultation from Gary Thomas for possible UTI.  He is an 84 year old male that is currently admitted with shortness of breath and reported low-grade fevers at home, with allegedly some some foul-smelling urine concerning for possible UTI.  He has a number of core morbidities including diabetes, stroke, history of pulmonary embolus, and chronic kidney disease.  His history is very complicated, and he is a very poor historian.  I was unable to find any urology records in our system or care everywhere.  He was reportedly previously followed by Gary Thomas and Gary Thomas in urology here in [place].  It sounds like he had a prostatectomy for prostate cancer approximately 10 years ago, and secondary to florid incontinence underwent a urinary diversion with a urostomy.  He also reportedly has only a solitary right kidney, however there is no recent cross-sectional imaging to evaluate this.  It sounds like he may have had obstruction on the left kidney after undergoing urinary diversion which ultimately caused atrophy.  His baseline creatinine is around 1.8.  He also reportedly underwent an adrenalectomy for pheochromocytoma.  He denies history of recurrent urinary tract infections.  He does not follow regularly with a urologist anymore.  He has not had any difficulty with his urostomy.  He denies any flank pain or hematuria.  He continues to complain of shortness of breath.  He also recently underwent open reduction and internal fixation of a right periprosthetic distal femur fracture with orthopedic team on 08/24/2018.   PMH: Past Medical History:  Diagnosis Date  . Depression   . Diabetes mellitus without complication (HCC)   . Dyspnea   . Elevated lipids   . Femur fracture (HCC) 08/22/2018  . GERD (gastroesophageal reflux disease)   . Hypertension   . Pulmonary emboli (HCC)   . Renal  insufficiency    only has 1 kidney  . Sleep apnea   . Stroke (HCC)    Rt side weakness    Surgical History: Past Surgical History:  Procedure Laterality Date  . 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 AORTA SURGERY    . CAROTID ARTERY - SUBCLAVIAN ARTERY BYPASS GRAFT Bilateral   . CATARACT EXTRACTION W/ INTRAOCULAR LENS  IMPLANT, BILATERAL    . CHOLECYSTECTOMY    . EYE SURGERY    . FEMUR FRACTURE SURGERY Right   . HIP FRACTURE SURGERY Bilateral   . JOINT REPLACEMENT    . ORIF FEMUR FRACTURE Right 08/24/2018   Procedure: OPEN REDUCTION INTERNAL FIXATION (ORIF) SUPRACONDYLAR FEMUR FRACTURE;  Surgeon: Gary Thomas, Gary Thomas, Gary Thomas;  Location: [facility] ORS;  Service: Orthopedics;  Laterality: Right;  . PROSTATE SURGERY    . REPLACEMENT TOTAL KNEE Right   . TOTAL HIP ARTHROPLASTY Right 03/02/2017   Procedure: TOTAL HIP ARTHROPLASTY ANTERIOR APPROACH;  Surgeon: Gary Thomas, Michael, Gary Thomas;  Location: [facility] OR[facility];  Service: Orthopedics;  Laterality: Right;  Marland Kitchen. VASCULAR SURGERY       Allergies:  Allergies  Allergen Reactions  . Codeine Anaphylaxis  . Morphine Anaphylaxis  . Gabapentin Other (See Comments)    Unsure of what the reaction was   . Stadol [Butorphanol]     Unknown  . Lyrica [Pregabalin] Rash  . Tapentadol Nausea Only    ONSET 01/04/2013    Family History: Family History  Problem Relation Age of Onset  . Heart attack Father   .  Stroke Father   . Heart disease Father   . Heart attack Mother     Social History:  reports that he quit smoking about 66 years ago. His smoking use included cigarettes. He has a 7.00 pack-year smoking history. He has quit using smokeless tobacco. He reports that he does not drink alcohol or use drugs.  ROS: Negative aside from those stated in the HPI.  Physical Exam: BP (!) 126/53 (BP Location: Right Arm)   Pulse 77   Temp 98 F (36.7 C) (Oral)   Resp 19   Ht 5\' 9"  (1.753 m)   Wt 93.3 kg   SpO2 97%   BMI 30.38 kg/m      Constitutional:  Alert and oriented, No acute distress. Cardiovascular: No clubbing, cyanosis, or edema. Respiratory: Appears short of breath GI: Abdomen is soft, nontender, nondistended, urostomy in right lower quadrant pink and healthy-appearing, clear yellow urine GU: No CVA tenderness Lymph: No cervical or inguinal lymphadenopathy. Skin: No rashes, bruises or suspicious lesions. Neurologic: Grossly intact, no focal deficits, moving all 4 extremities. Psychiatric: Normal mood and affect.  Laboratory Data: Reviewed Leukocytosis improving and creatinine down trending back to baseline from peak of 2.6 yesterday  Pertinent Imaging:  None to review  Assessment & Plan:   In summary, Gary Thomas is an 84 year old male with complex urologic history including what sounds like a prostatectomy 10 years prior with florid incontinence resulting in ultimately a urinary diversion with a urostomy for his incontinence.  He also reportedly had an atrophic left kidney as well as underwent a left adrenalectomy for pheochromocytoma.  He is currently admitted with shortness of breath and cough, and primary team had concern for possible urinary tract infection.  Blood cultures have shown no growth, and urine culture shows multiple species.  Urine culture from a urostomy will always be colonized with bacteria, and would only recommend treating with antibiotics for true UTI if symptomatic with fever or flank pain.    Recommend ongoing work-up of shortness of breath by primary team, urostomy is pink and healthy and does not require further evaluation.   Call if questions  Sondra ComeBrian C Sninsky, Gary Thomas  [facility] [address], [address] [place], [place] [address] [phone]

## 2018-10-07 NOTE — Clinical Social Work Note (Signed)
Clinical Social Work Assessment  Patient Details  Name: Gary Thomas MRN: [id number] Date of Birth: [birth year]  Date of referral:  10/07/18               Reason for consult:  Facility Placement                Permission sought to share information with:    Permission granted to share information::     Name::        Agency::     Relationship::     Contact Information:     Housing/Transportation Living arrangements for the past 2 months:  [place], [place] of Information:  Patient, Spouse Patient Interpreter Needed:  None Criminal Activity/Legal Involvement Pertinent to Current Situation/Hospitalization:  No - Comment as needed Significant Relationships:  Adult Children, Spouse Lives with:  Spouse Do you feel safe going back to the place where you live?  Yes Need for family participation in patient care:  Yes (Comment)  Care giving concerns:  Patient lives in [place] with his wife Gary Thomas.    Social Worker assessment / plan:  Holiday representative (CSW) reviewed chart and noted that patient is a readmission from home. CSW is familiar with patient from previous admissions. CSW met with patient and his wife Gary Thomas was at bedside. Patient was alert and oriented X4 and was laying in the bed. CSW introduced self and explained role of CSW department. Per wife patient has been home from [place] since 09/16/18. Per wife patient has a hospital bed and wheel chair at home. Per wife patient is mostly bed bound and gets to the wheel chair when [facility] home [facility] PT comes out. Per wife patient is on room air at baseline. Per patient and wife they prefer for patient to D/C home because they can't afford the co-pays at [facility]. CSW will continue to follow and assist as needed.    Employment status:  Temporary Insurance information:  Medicare PT Recommendations:  Not assessed at this time Information / Referral to community resources:  Other (Comment  Required)(Patient prefers to D/C home. )  Patient/Family's Response to care:  Patient and his wife prefer to D/C home.   Patient/Family's Understanding of and Emotional Response to Diagnosis, Current Treatment, and Prognosis:  Patient and his wife were very pleasant and thanked CSW for visit.   Emotional Assessment Appearance:  Appears stated age Attitude/Demeanor/Rapport:    Affect (typically observed):  Accepting, Adaptable, Pleasant Orientation:  Oriented to Self, Oriented to Place, Oriented to  Time, Oriented to Situation, Fluctuating Orientation (Suspected and/or reported Sundowners) Alcohol / Substance use:  Not Applicable Psych involvement (Current and /or in the community):  No (Comment)  Discharge Needs  Concerns to be addressed:  Discharge Planning Concerns Readmission within the last 30 days:  No Current discharge risk:  Dependent with Mobility, Chronically ill Barriers to Discharge:  Continued Medical Work up   [employer], Gary Beets, LCSW 10/07/2018, 3:04 PM

## 2018-10-07 NOTE — Progress Notes (Signed)
Pt has become confused and yelling for his wife. He is wheezing and coughing at this time. Nurse stopped IIV fluids stopped. 02 sat is 98 on 2 liters. MD paged.

## 2018-10-07 NOTE — Progress Notes (Signed)
[facility] - [place] [facility]   PATIENT NAME: Leroy KennedyDanford Ganim    MR#:  [id number]  DATE OF BIRTH:  04/04/1932  SUBJECTIVE:   Patient states he is doing okay this morning.  Endorses a cough and some shortness of breath.  On 2 L O2.  REVIEW OF SYSTEMS:  Review of Systems  Constitutional: Negative for chills and fever.  HENT: Negative for congestion and sore throat.   Eyes: Negative for blurred vision and double vision.  Respiratory: Positive for cough, shortness of breath and wheezing.   Cardiovascular: Negative for chest pain, palpitations and leg swelling.  Gastrointestinal: Negative for abdominal pain, nausea and vomiting.  Genitourinary: Negative for hematuria and urgency.  Musculoskeletal: Positive for back pain and joint pain.  Neurological: Negative for dizziness and headaches.  Psychiatric/Behavioral: Negative for depression. The patient is not nervous/anxious.     DRUG ALLERGIES:   Allergies  Allergen Reactions  . Codeine Anaphylaxis  . Morphine Anaphylaxis  . Gabapentin Other (See Comments)    Unsure of what the reaction was   . Stadol [Butorphanol]     Unknown  . Lyrica [Pregabalin] Rash  . Tapentadol Nausea Only    ONSET 01/04/2013   VITALS:  Blood pressure (!) 126/53, pulse 77, temperature 98 F (36.7 C), temperature source Oral, resp. rate 19, height 5\' 9"  (1.753 m), weight 93.3 kg, SpO2 97 %. PHYSICAL EXAMINATION:  Physical Exam  GENERAL:  84 y.o.-year-old patient lying in the bed with no acute distress.  EYES: Pupils equal, round, reactive to light . No scleral icterus. Extraocular muscles intact.  HEENT: Head atraumatic, normocephalic. Oropharynx and nasopharynx clear.  NECK:  Supple, no jugular venous distention. No thyroid enlargement, no tenderness.  LUNGS: + Diffuse rhonchi present, no use of accessory muscles of respiration.  CARDIOVASCULAR: S1, S2 normal. No murmurs, rubs, or gallops.  ABDOMEN: Soft, nontender,  nondistended. Bowel sounds present. No organomegaly or mass.  Patient has urostomy bag in the right lower quadrant lots of pus, stoma site also is having purulent surrounding that. EXTREMITIES: Right leg is in brace with dry dressings in place. NEUROLOGIC: Cranial nerves II through XII are intact. +global weakness. Sensation intact. Gait not checked.  PSYCHIATRIC: The patient is alert and oriented x 3.  SKIN: No obvious rash, lesion, or ulcer.  LABORATORY PANEL:  Male CBC Recent Labs  Lab 10/07/18 0339  WBC 12.3*  HGB 9.1*  HCT 30.2*  PLT 138*   ------------------------------------------------------------------------------------------------------------------ Chemistries  Recent Labs  Lab 10/06/18 1118 10/07/18 0339  NA 137 136  K 4.6 4.6  CL 101 105  CO2 26 24  GLUCOSE 119* 125*  BUN 47* 47*  CREATININE 2.61* 1.95*  CALCIUM 8.9 8.4*  AST 25  --   ALT 14  --   ALKPHOS 69  --   BILITOT 0.7  --    RADIOLOGY:  Dg Chest 2 View  Result Date: 10/07/2018 CLINICAL DATA:  SOB today. Hx of diabetes, HTN, stroke. Former smoker. EXAM: CHEST - 2 VIEW COMPARISON:  10/06/2018 FINDINGS: Shallow lung inflation. There is subsegmental atelectasis at both lung bases. The heart size is normal. No pulmonary edema. Surgical clips overlie the RIGHT lung apex and LEFT LOWER neck. IMPRESSION: Subsegmental atelectasis at both lung bases. Electronically Signed   By: Norva PavlovElizabeth  Brown M.D.   On: 10/07/2018 14:20   Dg Chest Port 1 View  Result Date: 10/06/2018 CLINICAL DATA:  Cough. EXAM: PORTABLE CHEST 1 VIEW COMPARISON:  Chest radiograph October 06, 2018 at 1124 hours. FINDINGS: Persistently elevated RIGHT hemidiaphragm with RIGHT lung base bandlike density. No pleural effusion or focal consolidation. Cardiomediastinal silhouette is normal, calcified aortic arch. No pneumothorax. Surgical clips RIGHT thoracic inlet and GE junction. Osteopenia. IMPRESSION: 1. Stable RIGHT lung base  atelectasis/scarring. 2.  Aortic Atherosclerosis (ICD10-I70.0). Electronically Signed   By: Awilda Metroourtnay  Bloomer M.D.   On: 10/06/2018 22:18   Dg Knee Right Port  Result Date: 10/07/2018 CLINICAL DATA:  ORIF EXAM: PORTABLE RIGHT KNEE - 1-2 VIEW COMPARISON:  Intraoperative fluoroscopic images dated 08/24/2018 FINDINGS: Lateral compression plate and screw fixation of a distal femoral shaft fracture. Fracture lucency remains visible. Deformity along the proximal femoral shaft, incompletely visualized. Prior right knee arthroplasty, without evidence of hardware complication. Visualized soft tissues are within normal limits. No suprapatellar knee joint effusion. IMPRESSION: Status post ORIF of a distal femoral shaft fracture. Fracture lucency remains visible. Prior right knee arthroplasty, without evidence of hardware complication. Electronically Signed   By: Charline BillsSriyesh  Krishnan M.D.   On: 10/07/2018 05:45   ASSESSMENT AND PLAN:   Sepsis- resolving. Due to unknown source. Leukocytosis improved and vitals are within normal limits. Urine culture with multiple species, which is expected with his urostomy. Flu negative -Continue ceftriaxone for now -Blood cultures pending -Repeat CXR today -Check RVP and procalcitonin  Possible wound dehiscence from the right knee- s/p ORIF by Dr. Joice LoftsPoggi 11/13 for right distal femur fracture -Ortho consulted- recommended wet-to-dry dressing and home health RN for dressing changes -Patient is NWB  COPD- rhonchi present this morning, remains on 2L O2. -Continue duonebs and home pulmicort  Acute on chronic renal failure, CKD stage III- Cr improved from 2.61 > 1.95. -Continue gentle IVFs -Hold losartan -Avoid nephrotoxic agents    Type 2 diabetes- blood sugars well-controlled -Hold on SSI -Monitor with daily BMP  History of PE -Continue Xarelto  Essential hypertension- normotensive -Continue metoprolol -Holding losartan with AKI  All the records are reviewed  and case discussed with Care Management/Social Worker. Management plans discussed with the patient, family and they are in agreement.  CODE STATUS: Full Code  TOTAL TIME TAKING CARE OF THIS PATIENT: 45 minutes.   More than 50% of the time was spent in counseling/coordination of care: YES  POSSIBLE D/C IN 1-2 DAYS, DEPENDING ON CLINICAL CONDITION.   Jinny BlossomKaty D Broly Hatfield M.D on 10/07/2018 at 6:26 PM  Between 7am to 6pm - Pager [phone]  After 6pm go to [email or web address] - Social research officer, governmentpassword EPAS [facility]  [facility]  Office  [phone]  CC: Primary care physician; Marguarite ArbourSparks, [name], MD  Note: This dictation was prepared with Dragon dictation along with smaller phrase technology. Any transcriptional errors that result from this process are unintentional.

## 2018-10-08 ENCOUNTER — Inpatient Hospital Stay: Payer: Medicare Other

## 2018-10-08 LAB — RESPIRATORY PANEL BY PCR
Adenovirus: NOT DETECTED
Bordetella pertussis: NOT DETECTED
Chlamydophila pneumoniae: NOT DETECTED
Coronavirus 229E: NOT DETECTED
Coronavirus HKU1: NOT DETECTED
Coronavirus NL63: NOT DETECTED
Coronavirus OC43: NOT DETECTED
INFLUENZA B-RVPPCR: NOT DETECTED
Influenza A: NOT DETECTED
Metapneumovirus: NOT DETECTED
Mycoplasma pneumoniae: NOT DETECTED
Parainfluenza Virus 1: NOT DETECTED
Parainfluenza Virus 2: NOT DETECTED
Parainfluenza Virus 3: NOT DETECTED
Parainfluenza Virus 4: NOT DETECTED
Respiratory Syncytial Virus: NOT DETECTED
Rhinovirus / Enterovirus: NOT DETECTED

## 2018-10-08 LAB — BASIC METABOLIC PANEL
Anion gap: 5 (ref 5–15)
BUN: 38 mg/dL — ABNORMAL HIGH (ref 8–23)
CO2: 25 mmol/L (ref 22–32)
Calcium: 8.6 mg/dL — ABNORMAL LOW (ref 8.9–10.3)
Chloride: 105 mmol/L (ref 98–111)
Creatinine, Ser: 1.56 mg/dL — ABNORMAL HIGH (ref 0.61–1.24)
GFR calc Af Amer: 47 mL/min — ABNORMAL LOW (ref >60)
GFR calc non Af Amer: 40 mL/min — ABNORMAL LOW (ref >60)
Glucose, Bld: 126 mg/dL — ABNORMAL HIGH (ref 70–99)
Potassium: 4.6 mmol/L (ref 3.5–5.1)
SODIUM: 135 mmol/L (ref 135–145)

## 2018-10-08 LAB — CBC
HCT: 29.8 % — ABNORMAL LOW (ref 39.0–52.0)
Hemoglobin: 9 g/dL — ABNORMAL LOW (ref 13.0–17.0)
MCH: 26 pg (ref 26.0–34.0)
MCHC: 30.2 g/dL (ref 30.0–36.0)
MCV: 86.1 fL (ref 80.0–100.0)
PLATELETS: 131 10*3/uL — AB (ref 150–400)
RBC: 3.46 MIL/uL — ABNORMAL LOW (ref 4.22–5.81)
RDW: 17.9 % — ABNORMAL HIGH (ref 11.5–15.5)
WBC: 7.1 10*3/uL (ref 4.0–10.5)
nRBC: 0 % (ref 0.0–0.2)

## 2018-10-08 LAB — GLUCOSE, CAPILLARY
GLUCOSE-CAPILLARY: 118 mg/dL — AB (ref 70–99)
Glucose-Capillary: 122 mg/dL — ABNORMAL HIGH (ref 70–99)

## 2018-10-08 LAB — PROCALCITONIN: Procalcitonin: 2.71 ng/mL

## 2018-10-08 MED ORDER — METHYLPREDNISOLONE SODIUM SUCC 125 MG IJ SOLR
60.0000 mg | Freq: Two times a day (BID) | INTRAMUSCULAR | Status: DC
  Administered 2018-10-08 – 2018-10-09 (×2): 60 mg via INTRAVENOUS
  Filled 2018-10-08 (×2): qty 2

## 2018-10-08 MED ORDER — METHYLPREDNISOLONE SODIUM SUCC 125 MG IJ SOLR: 60.0000 mg | Freq: Two times a day (BID) | INTRAMUSCULAR | Status: DC

## 2018-10-08 MED ORDER — SODIUM CHLORIDE 0.9 % IV SOLN
500.0000 mg | INTRAVENOUS | Status: DC
  Administered 2018-10-08: 500 mg via INTRAVENOUS
  Filled 2018-10-08 (×2): qty 500

## 2018-10-08 NOTE — Progress Notes (Signed)
Subjective:   S/P ORIF of right distal femur, DOS 08/24/18 Admitted for pneumonia and right incision drainage. Patient reports pain in the right femur as a 4 out of 10 Patient reports that his cough is minimally improved today. Wet-to-dry dressing applied to the right leg yesterday. Negative for chest pain and shortness of breath Fever: Most recent temp 98.6 Gastrointestinal:Negative for nausea and vomiting  Objective: Vital signs in last 24 hours: Temp:  [97.9 F (36.6 C)-98 F (36.7 C)] 97.9 F (36.6 C) (12/28 0826) Pulse Rate:  [74-91] 91 (12/28 0826) Resp:  [20] 20 (12/27 2334) BP: (119-132)/(51-60) 125/51 (12/28 0826) SpO2:  [94 %-98 %] 94 % (12/28 0826) Weight:  [96.8 kg] 96.8 kg (12/28 0500)  Intake/Output from previous day:  Intake/Output Summary (Last 24 hours) at 10/08/2018 0925 Last data filed at 10/07/2018 1933 Gross per 24 hour  Intake 320 ml  Output 500 ml  Net -180 ml    Intake/Output this shift: No intake/output data recorded.  Labs: Recent Labs    10/06/18 1118 10/07/18 0339 10/08/18 0527  HGB 10.5* 9.1* 9.0*   Recent Labs    10/07/18 0339 10/08/18 0527  WBC 12.3* 7.1  RBC 3.50* 3.46*  HCT 30.2* 29.8*  PLT 138* 131*   Recent Labs    10/07/18 0339 10/08/18 0527  NA 136 135  K 4.6 4.6  CL 105 105  CO2 24 25  BUN 47* 38*  CREATININE 1.95* 1.56*  GLUCOSE 125* 126*  CALCIUM 8.4* 8.6*   No results for input(s): LABPT, INR in the last 72 hours.   EXAM General - Patient is Alert, Appropriate and Oriented Extremity - Patient remains in a knee ROM brace today locked in extension. Dressing removed this AM, there is a area about 0.5 cm in length that has opened at the distal aspect of the incision.  No significant swelling or erythema on today's exam. The opening appears smaller compared to yesterday and appears to be filling in quickly.  New wet-to-dry dressing was applied.  Padded dressing was left intact over the area of the previous  blister which appears well-healed.  Patient was placed back in the knee immobilizer following dressing change. Motor Function - intact, moving foot and toes well on exam.   Past Medical History:  Diagnosis Date  . Depression   . Diabetes mellitus without complication (HCC)   . Dyspnea   . Elevated lipids   . Femur fracture (HCC) 08/22/2018  . GERD (gastroesophageal reflux disease)   . Hypertension   . Pulmonary emboli (HCC)   . Renal insufficiency    only has 1 kidney  . Sleep apnea   . Stroke (HCC)    Rt side weakness    Assessment/Plan:    Active Problems:   Sepsis (HCC)  Estimated body mass index is 31.51 kg/m as calculated from the following:   Height as of this encounter: 5\' 9"  (1.753 m).   Weight as of this encounter: 96.8 kg. Advance diet   Continue treatment for pneumonia. Wet-to-dry dressing applied today, will check on dressing for the right leg tomorrow, if continued improvement may be able to do simple honeycomb dressing. Upon discharge will need home health orders for nursing for continued dressing changes until wound is healed. Adjusted the brace, PT allowed to work on gentle flexion to the right knee, can advance to toe-touch weightbearing.  DVT Prophylaxis - Xarelto and TED hose Toe-touch weightbearing to the right leg.  Valeria BatmanJ. Lance Naomee Nowland, PA-C [facility]  Orthopaedic Surgery 10/08/2018, 9:25 AM

## 2018-10-08 NOTE — Progress Notes (Signed)
SATURATION QUALIFICATIONS: (This note is used to comply with regulatory documentation for home oxygen)  Patient Saturations on Room Air at Rest = 87%  Patient saturations on 1 L oxygen at rest = 94%  Patient Saturations on Room Air while Ambulating = Pt unable to ambulate   Please briefly explain why patient needs home oxygen: Pt oxygen is inadequate at rest without oxygen.

## 2018-10-08 NOTE — Progress Notes (Signed)
[facility] - [place] at [facility]   PATIENT NAME: Gary Thomas    MR#:  [id number]  DATE OF BIRTH:  01/10/1932  SUBJECTIVE:   Patient states he is doing okay this morning.  Endorses a cough and some shortness of breath.  On 2 L O2.  REVIEW OF SYSTEMS:  Review of Systems  Constitutional: Negative for chills and fever.  HENT: Negative for congestion and sore throat.   Eyes: Negative for blurred vision and double vision.  Respiratory: Positive for cough, shortness of breath and wheezing.   Cardiovascular: Negative for chest pain, palpitations and leg swelling.  Gastrointestinal: Negative for abdominal pain, nausea and vomiting.  Genitourinary: Negative for hematuria and urgency.  Musculoskeletal: Positive for back pain and joint pain.  Neurological: Negative for dizziness and headaches.  Psychiatric/Behavioral: Negative for depression. The patient is not nervous/anxious.    DRUG ALLERGIES:   Allergies  Allergen Reactions  . Codeine Anaphylaxis  . Morphine Anaphylaxis  . Gabapentin Other (See Comments)    Unsure of what the reaction was   . Stadol [Butorphanol]     Unknown  . Lyrica [Pregabalin] Rash  . Tapentadol Nausea Only    ONSET 01/04/2013   VITALS:  Blood pressure 131/70, pulse 82, temperature 98 F (36.7 C), temperature source Oral, resp. rate 20, height 5\' 9"  (1.753 m), weight 96.8 kg, SpO2 94 %. PHYSICAL EXAMINATION:  Physical Exam  GENERAL:  84 y.o.-year-old patient lying in the bed with no acute distress.  EYES: Pupils equal, round, reactive to light . No scleral icterus. Extraocular muscles intact.  HEENT: Head atraumatic, normocephalic. Oropharynx and nasopharynx clear.  NECK:  Supple, no jugular venous distention. No thyroid enlargement, no tenderness.  LUNGS: + Diffuse rhonchi present, no use of accessory muscles of respiration. [place] in place.  Speaking in full sentences. CARDIOVASCULAR: RRR, S1, S2 normal. No murmurs, rubs, or  gallops.  ABDOMEN: Soft, nontender, nondistended. Bowel sounds present. No organomegaly or mass. +urostomy back in the RLQ, stoma is pink.  EXTREMITIES: Right leg is in brace with dry dressings in place. NEUROLOGIC: Cranial nerves II through XII are intact. +global weakness. Sensation intact. Gait not checked.  PSYCHIATRIC: The patient is alert and oriented x 3.  SKIN: No obvious rash, lesion, or ulcer.  LABORATORY PANEL:  Male CBC Recent Labs  Lab 10/08/18 0527  WBC 7.1  HGB 9.0*  HCT 29.8*  PLT 131*   ------------------------------------------------------------------------------------------------------------------ Chemistries  Recent Labs  Lab 10/06/18 1118  10/08/18 0527  NA 137   < > 135  K 4.6   < > 4.6  CL 101   < > 105  CO2 26   < > 25  GLUCOSE 119*   < > 126*  BUN 47*   < > 38*  CREATININE 2.61*   < > 1.56*  CALCIUM 8.9   < > 8.6*  AST 25  --   --   ALT 14  --   --   ALKPHOS 69  --   --   BILITOT 0.7  --   --    < > = values in this interval not displayed.   RADIOLOGY:  Dg Chest 2 View  Result Date: 10/08/2018 CLINICAL DATA:  Shortness of breath. EXAM: CHEST - 2 VIEW COMPARISON:  Radiographs of October 07, 2018. FINDINGS: The heart size and mediastinal contours are within normal limits. No pneumothorax or pleural effusion is noted. Stable bibasilar subsegmental atelectasis or scarring is noted. Stable elevated right  hemidiaphragm. The visualized skeletal structures are unremarkable. IMPRESSION: Stable bibasilar subsegmental atelectasis or scarring. Electronically Signed   By: Lupita RaiderJames  Green Jr, M.D.   On: 10/08/2018 16:00   ASSESSMENT AND PLAN:   Acute hypoxic respiratory failure- unclear etiology. Repeated CXR without any clear pneumonia. No concerns for aspiration per the family. No wheezing to suggest COPD exacerbation. On xarelto at home, so PE is unlikely. Flu negative. PCT elevated. -Continue ceftriaxone and azithromycin for now -Solumedrol IV -Continue  home inhalers -Duonebs prn -Continue supplemental O2, wean to room air as able -If no improvement, can consider CT chest  Sepsis- resolving. Due to unknown source- UTI vs respiratory source. Urine culture with multiple species, which is expected with his urostomy. Urology does not think this is a UTI. -Continue ceftriaxone and azithromycin. -Blood cultures with no growth thus far  Possible wound dehiscence from the right knee- s/p ORIF by Dr. Joice LoftsPoggi 11/13 for right distal femur fracture -Ortho consulted- recommended wet-to-dry dressing and home health RN for dressing changes -Patient is NWB  COPD- rhonchi present this morning, remains on 2L O2. -Continue duonebs and home pulmicort  CKD stage III- initially with AKI, but Cr is back to baseline -Stop fluids and encourage po intake -Hold losartan -Avoid nephrotoxic agents    Type 2 diabetes- blood sugars well-controlled -Hold on SSI -Monitor with daily BMP  History of PE -Continue Xarelto  Essential hypertension- normotensive -Continue metoprolol -Holding losartan with AKI  All the records are reviewed and case discussed with Care Management/Social Worker. Management plans discussed with the patient, family and they are in agreement.  CODE STATUS: Full Code  TOTAL TIME TAKING CARE OF THIS PATIENT: 45 minutes.   More than 50% of the time was spent in counseling/coordination of care: YES  POSSIBLE D/C IN 1-2 DAYS, DEPENDING ON CLINICAL CONDITION.   Jinny BlossomKaty D Kailie Polus M.D on 10/08/2018 at 6:35 PM  Between 7am to 6pm - Pager - [phone]  After 6pm go to [email or web address] - Social research officer, governmentpassword EPAS [facility]  [facility] [place] [facility]  Office  [phone]  CC: Primary care physician; Marguarite ArbourSparks, [name], MD  Note: This dictation was prepared with Dragon dictation along with smaller phrase technology. Any transcriptional errors that result from this process are unintentional.

## 2018-10-08 NOTE — Progress Notes (Signed)
SATURATION QUALIFICATIONS: (This note is used to comply with regulatory documentation for home oxygen)  Patient Saturations on Room Air at Rest = 78%  Patient saturations on 2 L at rest = 94%  Patient Saturations on Room Air while Ambulating = Pt is unable to ambulate  Please briefly explain why patient needs home oxygen: Pt is unable to tolerate room air at rest.

## 2018-10-09 LAB — CBC
HCT: 36.6 % — ABNORMAL LOW (ref 39.0–52.0)
HEMOGLOBIN: 10.7 g/dL — AB (ref 13.0–17.0)
MCH: 25.9 pg — ABNORMAL LOW (ref 26.0–34.0)
MCHC: 29.2 g/dL — ABNORMAL LOW (ref 30.0–36.0)
MCV: 88.6 fL (ref 80.0–100.0)
Platelets: 148 10*3/uL — ABNORMAL LOW (ref 150–400)
RBC: 4.13 MIL/uL — ABNORMAL LOW (ref 4.22–5.81)
RDW: 17.4 % — ABNORMAL HIGH (ref 11.5–15.5)
WBC: 6.9 10*3/uL (ref 4.0–10.5)
nRBC: 0 % (ref 0.0–0.2)

## 2018-10-09 LAB — BASIC METABOLIC PANEL WITH GFR
Anion gap: 9 (ref 5–15)
BUN: 39 mg/dL — ABNORMAL HIGH (ref 8–23)
CO2: 22 mmol/L (ref 22–32)
Calcium: 9 mg/dL (ref 8.9–10.3)
Chloride: 105 mmol/L (ref 98–111)
Creatinine, Ser: 1.59 mg/dL — ABNORMAL HIGH (ref 0.61–1.24)
GFR calc Af Amer: 46 mL/min — ABNORMAL LOW
GFR calc non Af Amer: 39 mL/min — ABNORMAL LOW
Glucose, Bld: 230 mg/dL — ABNORMAL HIGH (ref 70–99)
Potassium: 5.2 mmol/L — ABNORMAL HIGH (ref 3.5–5.1)
Sodium: 136 mmol/L (ref 135–145)

## 2018-10-09 LAB — GLUCOSE, CAPILLARY
Glucose-Capillary: 206 mg/dL — ABNORMAL HIGH (ref 70–99)
Glucose-Capillary: 244 mg/dL — ABNORMAL HIGH (ref 70–99)
Glucose-Capillary: 167 mg/dL — ABNORMAL HIGH (ref 70–99)

## 2018-10-09 LAB — BLOOD GAS, ARTERIAL
ACID-BASE DEFICIT: 0 mmol/L (ref 0.0–2.0)
Bicarbonate: 26.4 mmol/L (ref 20.0–28.0)
FIO2: 0.24
O2 Saturation: 89.4 %
Patient temperature: 37
pCO2 arterial: 49 mmHg — ABNORMAL HIGH (ref 32.0–48.0)
pH, Arterial: 7.34 — ABNORMAL LOW (ref 7.350–7.450)
pO2, Arterial: 61 mmHg — ABNORMAL LOW (ref 83.0–108.0)

## 2018-10-09 LAB — PROCALCITONIN: Procalcitonin: 1.5 ng/mL

## 2018-10-09 MED ORDER — LOSARTAN POTASSIUM 25 MG PO TABS
25.0000 mg | ORAL_TABLET | Freq: Every day | ORAL | Status: DC
  Administered 2018-10-09: 25 mg via ORAL
  Filled 2018-10-09: qty 1

## 2018-10-09 MED ORDER — INSULIN ASPART 100 UNIT/ML ~~LOC~~ SOLN
0.0000 [IU] | Freq: Three times a day (TID) | SUBCUTANEOUS | Status: DC
  Administered 2018-10-09: 3 [IU] via SUBCUTANEOUS
  Filled 2018-10-09: qty 1

## 2018-10-09 MED ORDER — AZITHROMYCIN 250 MG PO TABS
250.0000 mg | ORAL_TABLET | Freq: Every day | ORAL | Status: DC
  Administered 2018-10-09 – 2018-10-10 (×2): 250 mg via ORAL
  Filled 2018-10-09 (×2): qty 1

## 2018-10-09 MED ORDER — INSULIN ASPART 100 UNIT/ML ~~LOC~~ SOLN: 0.0000 [IU] | Freq: Every day | SUBCUTANEOUS | Status: DC

## 2018-10-09 MED ORDER — METHYLPREDNISOLONE SODIUM SUCC 125 MG IJ SOLR
60.0000 mg | Freq: Every day | INTRAMUSCULAR | Status: DC
  Administered 2018-10-10: 60 mg via INTRAVENOUS
  Filled 2018-10-09: qty 2

## 2018-10-09 MED ORDER — IPRATROPIUM-ALBUTEROL 0.5-2.5 (3) MG/3ML IN SOLN
3.0000 mL | Freq: Two times a day (BID) | RESPIRATORY_TRACT | Status: DC
  Administered 2018-10-09 – 2018-10-10 (×2): 3 mL via RESPIRATORY_TRACT
  Filled 2018-10-09 (×2): qty 3

## 2018-10-09 NOTE — Progress Notes (Signed)
Md notified of pt increase in lethargy and breathing pattern. History of sleep apnea noted in chart. I asked the patient did he use CPAP at home and he nodded his head yes. Orders for stat ABG and CPAP placed. After ABG results were reviewed by MD, orders were changed to Bipap QHS. RT notified. VSS. Will continue to monitor.

## 2018-10-09 NOTE — Progress Notes (Signed)
Per nursing patient states he uses CPAP at home, CPAP ordered.  Kristeen MissWILLIS, Jaretssi Kraker FIELDING [facility] 10/09/2018, 1:14 AM  Note:  This document was prepared using Dragon voice recognition software and may include unintentional dictation errors.

## 2018-10-09 NOTE — Progress Notes (Signed)
Refused Bipap 

## 2018-10-09 NOTE — Progress Notes (Signed)
PHARMACIST - PHYSICIAN COMMUNICATION DR:   Sainani CONCERNING: Antibiotic IV to Oral Route Change Policy  RECOMMENDATION: This patient is receiving azithromycin by the intravenous route.  Based on criteria approved by the Pharmacy and Therapeutics Committee, the antibiotic(s) is/are being converted to the equivalent oral dose form(s).   DESCRIPTION: These criteria include:  Patient being treated for a respiratory tract infection, urinary tract infection, cellulitis or clostridium difficile associated diarrhea if on metronidazole  The patient is not neutropenic and does not exhibit a GI malabsorption state  The patient is eating (either orally or via tube) and/or has been taking other orally administered medications for a least 24 hours  The patient is improving clinically and has a Tmax < 100.5  If you have questions about this conversion, please contact the Pharmacy Department  []  ( [phone] )  [place] [x]  ( [phone] )  [facility] []  ( [phone]  [place] []  ( [phone]  [facility] []  ( [phone] )  [facility]   

## 2018-10-09 NOTE — Progress Notes (Signed)
[facility] - [place] at [facility]      PATIENT NAME: Gary Thomas    MR#:  [id number]  DATE OF BIRTH:  Oct 28, 1931  SUBJECTIVE:   No acute events overnight, shortness of breath and respiratory distress is improved.  Patient tolerated his CPAP well last night.  REVIEW OF SYSTEMS:    Review of Systems  Constitutional: Negative for chills and fever.  HENT: Negative for congestion and tinnitus.   Eyes: Negative for blurred vision and double vision.  Respiratory: Negative for cough, shortness of breath and wheezing.   Cardiovascular: Negative for chest pain, orthopnea and PND.  Gastrointestinal: Negative for abdominal pain, diarrhea, nausea and vomiting.  Genitourinary: Negative for dysuria and hematuria.  Neurological: Negative for dizziness, sensory change and focal weakness.  All other systems reviewed and are negative.   Nutrition: Heart healthy/Carb control Tolerating Diet: Yes Tolerating PT: Eval noted.   DRUG ALLERGIES:   Allergies  Allergen Reactions  . Codeine Anaphylaxis  . Morphine Anaphylaxis  . Gabapentin Other (See Comments)    Unsure of what the reaction was   . Stadol [Butorphanol]     Unknown  . Lyrica [Pregabalin] Rash  . Tapentadol Nausea Only    ONSET 01/04/2013  . Tramadol     Hallucinations "I see giant spiders on ceiling". [place]    VITALS:  Blood pressure (!) 153/57, pulse 71, temperature (!) 97.5 F (36.4 C), temperature source Oral, resp. rate 20, height 5\' 9"  (1.753 m), weight 92.7 kg, SpO2 93 %.  PHYSICAL EXAMINATION:   Physical Exam  GENERAL:  84 y.o.-year-old patient lying in bed in no acute distress.  EYES: Pupils equal, round, reactive to light and accommodation. No scleral icterus. Extraocular muscles intact.  HEENT: Head atraumatic, normocephalic. Oropharynx and nasopharynx clear.  NECK:  Supple, no jugular venous distention. No thyroid enlargement, no tenderness.   LUNGS: Normal breath sounds bilaterally, no wheezing, rales, rhonchi. No use of accessory muscles of respiration.  CARDIOVASCULAR: S1, S2 normal. No murmurs, rubs, or gallops.  ABDOMEN: Soft, nontender, nondistended. Bowel sounds present. No organomegaly or mass.  EXTREMITIES: No cyanosis, clubbing or edema b/l.   Right leg in a immobilizer NEUROLOGIC: Cranial nerves II through XII are intact. No focal Motor or sensory deficits b/l.  Globallyw eak PSYCHIATRIC: The patient is alert and oriented x 3.  SKIN: No obvious rash, lesion, or ulcer.    LABORATORY PANEL:   CBC Recent Labs  Lab 10/09/18 0532  WBC 6.9  HGB 10.7*  HCT 36.6*  PLT 148*   ------------------------------------------------------------------------------------------------------------------  Chemistries  Recent Labs  Lab 10/06/18 1118  10/09/18 0532  NA 137   < > 136  K 4.6   < > 5.2*  CL 101   < > 105  CO2 26   < > 22  GLUCOSE 119*   < > 230*  BUN 47*   < > 39*  CREATININE 2.61*   < > 1.59*  CALCIUM 8.9   < > 9.0  AST 25  --   --   ALT 14  --   --   ALKPHOS 69  --   --   BILITOT 0.7  --   --    < > = values in this interval not displayed.   ------------------------------------------------------------------------------------------------------------------  Cardiac Enzymes Recent Labs  Lab 10/06/18 1118  TROPONINI 0.06*   ------------------------------------------------------------------------------------------------------------------  RADIOLOGY:  Dg Chest 2 View  Result Date: 10/08/2018 CLINICAL DATA:  Shortness of breath. EXAM: CHEST - 2 VIEW COMPARISON:  Radiographs of October 07, 2018. FINDINGS: The heart size and mediastinal contours are within normal limits. No pneumothorax or pleural effusion is noted. Stable bibasilar subsegmental atelectasis or scarring is noted. Stable elevated right hemidiaphragm. The visualized skeletal structures are unremarkable. IMPRESSION: Stable bibasilar  subsegmental atelectasis or scarring. Electronically Signed   By: Lupita RaiderJames  Green Jr, M.D.   On: 10/08/2018 16:00     ASSESSMENT AND PLAN:   84 year old male with past medical history of hypertension, diabetes, GERD, previous history of pulmonary emboli, obstructive sleep apnea, depression who presented to the hospital due to shortness of breath, fever.  #Acute hypoxic respiratory failure- unclear etiology. Repeated CXR without any clear pneumonia. No concerns for aspiration per the family. No wheezing to suggest COPD exacerbation. On xarelto at home, so PE is unlikely. Flu negative. PCT elevated. -Continue ceftriaxone and azithromycin for now - cont. Solumedrol IV but will taper and switch to Oral pred in a.m.  - Continue home inhalers - Duonebs prn -Continue supplemental O2, wean to room air as able - improving and therefore hold off on  CT chest  # Sepsis- resolving. Due to unknown source- UTI vs respiratory source. Urine culture with multiple species, which is expected with his urostomy. Urology does not think this is a UTI. - Continue ceftriaxone and azithromycin. - Blood cultures with no growth thus far  # Possible wound dehiscence from the right knee- s/p ORIF by Dr. Joice LoftsPoggi 11/13 for right distal femur fracture -Ortho consulted- recommended wet-to-dry dressing and home health RN for dressing changes - Patient is NWB has immobilizer.  Ortho to change dressing in a.m. again.   # COPD- mild acute exacerbation and improving.  - cont. Treatment as mentioned above.   # CKD stage III- initially with AKI, but Cr is back to baseline - off IV fluids and will resume Losartan.  -Avoid nephrotoxic agents   # Type 2 diabetes- blood sugars well-controlled - will place on SSI Sensitive scale.   # History of PE -Continue Xarelto  # Essential hypertension- BP running a bit high today.  -Continue metoprolol, will resume Losartan as AKI improved.   # hx of Gout - no acute attack -  cont. Allopurinol.   # Depression - cont. Zoloft.   Likely discharge home tomorrow.   All the records are reviewed and case discussed with Care Management/Social Worker. Management plans discussed with the patient, family and they are in agreement.  CODE STATUS: Full code  DVT Prophylaxis: Xarelto  TOTAL TIME TAKING CARE OF THIS PATIENT: 30 minutes.   POSSIBLE D/C IN 1-2 DAYS, DEPENDING ON CLINICAL CONDITION.   Houston SirenSAINANI,VIVEK J M.D on 10/09/2018 at 2:43 PM  Between 7am to 6pm - Pager - [phone]  After 6pm go to [email or web address] - Social research officer, governmentpassword EPAS [facility]  [facility] [place] [facility]  Office  [phone]  CC: Primary care physician; Marguarite ArbourSparks, [name], MD

## 2018-10-09 NOTE — Progress Notes (Signed)
Subjective:   S/P ORIF of right distal femur, DOS 08/24/18 Admitted for pneumonia and right incision drainage. Patient reports pain in the right femur as a 4 out of 10 Patient reports that his cough is minimally improved today.  Started on Bipap last night. Wet-to-dry dressing applied to the right leg yesterday. Negative for chest pain and shortness of breath this AM, continues to report cough. Fever: Most recent temp 97.6 Gastrointestinal:Negative for nausea and vomiting  Objective: Vital signs in last 24 hours: Temp:  [97.6 F (36.4 C)-98 F (36.7 C)] 97.6 F (36.4 C) (12/28 2318) Pulse Rate:  [62-91] 67 (12/29 0101) Resp:  [16-21] 16 (12/29 0101) BP: (109-151)/(51-70) 151/55 (12/29 0101) SpO2:  [94 %-96 %] 95 % (12/29 0410) Weight:  [92.7 kg] 92.7 kg (12/29 0539)  Intake/Output from previous day:  Intake/Output Summary (Last 24 hours) at 10/09/2018 0755 Last data filed at 10/09/2018 0535 Gross per 24 hour  Intake 350.03 ml  Output 1475 ml  Net -1124.97 ml    Intake/Output this shift: No intake/output data recorded.  Labs: Recent Labs    10/06/18 1118 10/07/18 0339 10/08/18 0527 10/09/18 0532  HGB 10.5* 9.1* 9.0* 10.7*   Recent Labs    10/08/18 0527 10/09/18 0532  WBC 7.1 6.9  RBC 3.46* 4.13*  HCT 29.8* 36.6*  PLT 131* 148*   Recent Labs    10/08/18 0527 10/09/18 0532  NA 135 136  K 4.6 5.2*  CL 105 105  CO2 25 22  BUN 38* 39*  CREATININE 1.56* 1.59*  GLUCOSE 126* 230*  CALCIUM 8.6* 9.0   No results for input(s): LABPT, INR in the last 72 hours.   EXAM General - Patient is Alert, Appropriate and Oriented Extremity - Patient remains in a knee ROM brace today locked in extension. Dressing removed this AM, there is a area about 0.3 cm in length that has opened at the distal aspect of the incision.  No significant swelling or erythema on today's exam. The opening appears smaller compared to yesterday and appears to be filling in quickly.  New  wet-to-dry dressing was applied.  Padded dressing was left intact over the area of the previous blister which appears well-healed.  Patient was placed back in the knee immobilizer following dressing change. Motor Function - intact, moving foot and toes well on exam.   Past Medical History:  Diagnosis Date  . Depression   . Diabetes mellitus without complication (HCC)   . Dyspnea   . Elevated lipids   . Femur fracture (HCC) 08/22/2018  . GERD (gastroesophageal reflux disease)   . Hypertension   . Pulmonary emboli (HCC)   . Renal insufficiency    only has 1 kidney  . Sleep apnea   . Stroke (HCC)    Rt side weakness    Assessment/Plan:    Active Problems:   Sepsis (HCC)  Estimated body mass index is 30.18 kg/m as calculated from the following:   Height as of this encounter: 5\' 9"  (1.753 m).   Weight as of this encounter: 92.7 kg. Advance diet   Continue treatment for pneumonia. Wet-to-dry dressing applied today, was hoping to change to dry dressing today but with appearance of wound this AM did continue with a wet-to-dry.  Possible honeycomb dressing application tomorrow morning. Upon discharge will need home health orders for nursing for continued dressing changes until wound is healed. Up with PT if tolerated.  DVT Prophylaxis - Xarelto and TED hose Toe-touch weightbearing to the right  leg.  Valeria BatmanJ. Lance McGhee, PA-C [facility] 10/09/2018, 7:55 AM

## 2018-10-10 ENCOUNTER — Other Ambulatory Visit: Payer: Self-pay | Admitting: Adult Health

## 2018-10-10 LAB — GLUCOSE, CAPILLARY
Glucose-Capillary: 111 mg/dL — ABNORMAL HIGH (ref 70–99)
Glucose-Capillary: 161 mg/dL — ABNORMAL HIGH (ref 70–99)

## 2018-10-10 LAB — BASIC METABOLIC PANEL
Anion gap: 7 (ref 5–15)
BUN: 49 mg/dL — ABNORMAL HIGH (ref 8–23)
CO2: 24 mmol/L (ref 22–32)
Calcium: 8.9 mg/dL (ref 8.9–10.3)
Chloride: 106 mmol/L (ref 98–111)
Creatinine, Ser: 1.61 mg/dL — ABNORMAL HIGH (ref 0.61–1.24)
GFR calc Af Amer: 45 mL/min — ABNORMAL LOW (ref >60)
GFR, EST NON AFRICAN AMERICAN: 39 mL/min — AB (ref >60)
GLUCOSE: 150 mg/dL — AB (ref 70–99)
Potassium: 4.4 mmol/L (ref 3.5–5.1)
Sodium: 137 mmol/L (ref 135–145)

## 2018-10-10 MED ORDER — LEVOFLOXACIN 250 MG PO TABS: 250.0000 mg | ORAL_TABLET | Freq: Every day | ORAL | 0 refills | Status: AC

## 2018-10-10 MED ORDER — PREDNISONE 10 MG PO TABS: ORAL_TABLET | ORAL | 0 refills | Status: DC

## 2018-10-10 NOTE — Progress Notes (Signed)
Patient's being discharged home. Reviewed discharge instructions and gave scripts to wife to meet patient at the house with EMS Waiting for EMS at this time.

## 2018-10-10 NOTE — Care Management (Addendum)
Rosey Batheresa with [facility] notified of patient discharge. Patient requesting EMS to home.  Attempted to reach patient's wife at home and on cell number but no answer and unable to leave message.  Update: patient states his wife is aware that he will be going home by EMS today.

## 2018-10-10 NOTE — Discharge Summary (Signed)
[facility] - [place] [facility]   PATIENT NAME: Gary Thomas    MR#:  [id number]  DATE OF BIRTH:  May 26, 1932  DATE OF ADMISSION:  10/06/2018 ADMITTING PHYSICIAN: Katha Hamming, MD  DATE OF DISCHARGE: 10/10/2018  PRIMARY CARE PHYSICIAN: Marguarite Arbour, MD    ADMISSION DIAGNOSIS:  Weakness [R53.1] Hypoxia [R09.02] Urinary tract infection without hematuria, site unspecified [N39.0] Sepsis, due to unspecified organism, unspecified whether acute organ dysfunction present (HCC) [A41.9]  DISCHARGE DIAGNOSIS:  Active Problems:   Sepsis (HCC)   SECONDARY DIAGNOSIS:   Past Medical History:  Diagnosis Date  . Depression   . Diabetes mellitus without complication (HCC)   . Dyspnea   . Elevated lipids   . Femur fracture (HCC) 08/22/2018  . GERD (gastroesophageal reflux disease)   . Hypertension   . Pulmonary emboli (HCC)   . Renal insufficiency    only has 1 kidney  . Sleep apnea   . Stroke (HCC)    Rt side weakness    HOSPITAL COURSE:   84 year old male with past medical history of hypertension, diabetes, GERD, previous history of pulmonary emboli, obstructive sleep apnea, depression who presented to the hospital due to shortness of breath, fever.  # Acute hypoxic respiratory failure -etiology remains unclear but likely suspected from underlying COPD exacerbation. -Chest x-ray was negative for pneumonia, patient's flu test was negative, empirically patient was treated with IV ceftriaxone, Zithromax and also given IV Solu-Medrol for wheezing and bronchospasm.  -Pulmonary embolism was unlikely as patient was already on long-term anticoagulation with Xarelto.  With above treatment patient symptoms have improved.  He is empirically being discharged on oral prednisone taper and Levaquin for a few days.  # Sepsis- resolving. UTI vs respiratory source. Urine culture with multiple species, which is expected with his urostomy.Urology does not think  this is a UTI.  Source was likely respiratory in nature and patient was treated with IV ceftriaxone, Zithromax and has improved.  Blood cultures remain negative.  Patient will be discharged on oral Levaquin for a few days.  # Possible wound dehiscence from the right knee- s/p ORIF by Dr. Joice Lofts 11/13 for right distal femur fracture -Ortho was consulted- recommended wet-to-dry dressing and home health RN for dressing changes - Patient is NWB has immobilizer. Cont. Follow up with Ortho as outpatient.    # COPD- mild acute exacerbation and improving.  -Patient was treated with IV steroids and has improved.  He was also given scheduled duo nebs, Pulmicort nebs.  He was given empiric antibiotics with ceftriaxone, Zithromax.  He is now being discharged on oral prednisone taper and empiric Levaquin.  He will continue his maintenance inhalers as stated below.  # CKD stage III-initially with AKI, but Cr is back to baseline -Improved with IV fluid hydration.  Losartan has been resumed.  Creatinine stable.  # Type 2 diabetes-on the hospital patient was on sliding scale insulin, but will resume his insulin 70/30 upon discharge.  # History of PE - pt. Will Continue Xarelto  # Essential hypertension- pt. Will cont. His Losartan, Toprol.   # hx of Gout - no acute attack - pt. Will cont. Allopurinol.   # Depression - pt. Will cont. Zoloft.   DISCHARGE CONDITIONS:   Stable.   CONSULTS OBTAINED:  Treatment Team:  Donato Heinz, MD  DRUG ALLERGIES:   Allergies  Allergen Reactions  . Codeine Anaphylaxis  . Morphine Anaphylaxis  . Gabapentin Other (See Comments)    Unsure of  what the reaction was   . Stadol [Butorphanol]     Unknown  . Lyrica [Pregabalin] Rash  . Tapentadol Nausea Only    ONSET 01/04/2013  . Tramadol     Hallucinations "I see giant spiders on ceiling". Pt also believes he is in [facility], but he is in [place].    DISCHARGE MEDICATIONS:   Allergies as of 10/10/2018       Reactions   Codeine Anaphylaxis   Morphine Anaphylaxis   Gabapentin Other (See Comments)   Unsure of what the reaction was   Stadol [butorphanol]    Unknown   Lyrica [pregabalin] Rash   Tapentadol Nausea Only   ONSET 01/04/2013   Tramadol    Hallucinations "I see giant spiders on ceiling". Pt also believes he is in [facility], but he is in [place].      Medication List    TAKE these medications   acetaminophen 325 MG tablet Commonly known as:  TYLENOL Take 2 tablets (650 mg total) by mouth every 6 (six) hours as needed for mild pain (or Fever >/= 101).   albuterol (2.5 MG/3ML) 0.083% nebulizer solution Commonly known as:  PROVENTIL Take 3 mLs (2.5 mg total) by nebulization every 4 (four) hours as needed for wheezing or shortness of breath.   allopurinol 100 MG tablet Commonly known as:  ZYLOPRIM Take 0.5 tablets (50 mg total) by mouth every other day.   atorvastatin 20 MG tablet Commonly known as:  LIPITOR Take 1 tablet (20 mg total) by mouth at bedtime.   budesonide 0.5 MG/2ML nebulizer solution Commonly known as:  PULMICORT Take 2 mLs (0.5 mg total) by nebulization 2 (two) times daily.   Cholecalciferol 100 MCG (4000 UT) Caps Take 1 capsule by mouth daily.   DERMACLOUD EX Apply liberal amount topically as needed to area of skin irritation.  Ok to leave at bedside   ferrous sulfate 325 (65 FE) MG EC tablet Take 1 tablet (325 mg total) by mouth 3 (three) times daily with meals.   furosemide 40 MG tablet Commonly known as:  LASIX Take 1 tablet (40 mg total) by mouth at bedtime.   ipratropium-albuterol 0.5-2.5 (3) MG/3ML Soln Commonly known as:  DUONEB Take 3 mLs by nebulization 2 (two) times daily.   levofloxacin 250 MG tablet Commonly known as:  LEVAQUIN Take 1 tablet (250 mg total) by mouth daily for 5 days.   loperamide 2 MG tablet Commonly known as:  IMODIUM A-D Take 2 mg by mouth 4 (four) times daily as needed for diarrhea or loose stools.   losartan 25  MG tablet Commonly known as:  COZAAR Take 1 tablet (25 mg total) by mouth at bedtime.   metoprolol succinate 100 MG 24 hr tablet Commonly known as:  TOPROL-XL Take 1 tablet (100 mg total) by mouth at bedtime.   mirtazapine 15 MG tablet Commonly known as:  REMERON Take 15 mg by mouth at bedtime.   multivitamin-lutein Caps capsule Take 1 capsule by mouth daily.   NON FORMULARY Diet Type: NAS. NCS   NOVOLOG MIX 70/30 FLEXPEN (70-30) 100 UNIT/ML FlexPen Generic drug:  insulin aspart protamine - aspart Inject 0.25 mLs (25 Units total) into the skin daily. What changed:  when to take this   nystatin powder Commonly known as:  MYCOSTATIN/NYSTOP Apply topically as directed. Apply with each ostomy change as instructed   omeprazole 40 MG capsule Commonly known as:  PRILOSEC Take 1 capsule (40 mg total) by mouth daily.  predniSONE 10 MG tablet Commonly known as:  DELTASONE Label  & dispense according to the schedule below. 5 Pills PO for 1 day then, 4 Pills PO for 1 day, 3 Pills PO for 1 day, 2 Pills PO for 1 day, 1 Pill PO for 1 days then STOP.   sertraline 100 MG tablet Commonly known as:  ZOLOFT Take 1 tablet (100 mg total) by mouth at bedtime.   tiotropium 18 MCG inhalation capsule Commonly known as:  SPIRIVA Place 18 mcg into inhaler and inhale daily.   traMADol 50 MG tablet Commonly known as:  ULTRAM Take 1 tablet (50 mg total) by mouth every 6 (six) hours as needed for moderate pain.   XARELTO 15 MG Tabs tablet Generic drug:  Rivaroxaban Take 1 tablet (15 mg total) by mouth at bedtime.            Durable Medical Equipment  (From admission, onward)         Start     Ordered   10/10/18 0806  For home use only DME Other see comment  Once    Comments:  LEFT AFO FOOT BRACE PLEASE PLACE PRIOR TO DISCHARGE [facility]   10/10/18 0806            DISCHARGE INSTRUCTIONS:   DIET:  Cardiac diet and Diabetic diet  DISCHARGE CONDITION:   Stable  ACTIVITY:  Activity as tolerated  OXYGEN:  Home Oxygen: No.   Oxygen Delivery: room air  DISCHARGE LOCATION:  Home with Home Health PT, RN, Aide.     If you experience worsening of your admission symptoms, develop shortness of breath, life threatening emergency, suicidal or homicidal thoughts you must seek medical attention immediately by calling 911 or calling your MD immediately  if symptoms less severe.  You Must read complete instructions/literature along with all the possible adverse reactions/side effects for all the Medicines you take and that have been prescribed to you. Take any new Medicines after you have completely understood and accpet all the possible adverse reactions/side effects.   Please note  You were cared for by a hospitalist during your hospital stay. If you have any questions about your discharge medications or the care you received while you were in the hospital after you are discharged, you can call the unit and asked to speak with the hospitalist on call if the hospitalist that took care of you is not available. Once you are discharged, your primary care physician will handle any further medical issues. Please note that NO REFILLS for any discharge medications will be authorized once you are discharged, as it is imperative that you return to your primary care physician (or establish a relationship with a primary care physician if you do not have one) for your aftercare needs so that they can reassess your need for medications and monitor your lab values.     Today   No acute events overnight, patient wants to go home.  Denies any shortness of breath, wheezing or bronchospasm.  Afebrile.  Hemodynamically stable.  VITAL SIGNS:  Blood pressure 139/90, pulse 62, temperature (!) 97.5 F (36.4 C), temperature source Oral, resp. rate 18, height 5\' 9"  (1.753 m), weight 92.7 kg, SpO2 94 %.  I/O:    Intake/Output Summary (Last 24 hours) at 10/10/2018  1439 Last data filed at 10/10/2018 1404 Gross per 24 hour  Intake 340 ml  Output 1500 ml  Net -1160 ml    PHYSICAL EXAMINATION:   GENERAL:  84 y.o.-year-old patient lying in bed in no acute distress.  EYES: Pupils equal, round, reactive to light and accommodation. No scleral icterus. Extraocular muscles intact.  HEENT: Head atraumatic, normocephalic. Oropharynx and nasopharynx clear.  NECK:  Supple, no jugular venous distention. No thyroid enlargement, no tenderness.  LUNGS: Normal breath sounds bilaterally, no wheezing, rales, rhonchi. No use of accessory muscles of respiration.  CARDIOVASCULAR: S1, S2 normal. No murmurs, rubs, or gallops.  ABDOMEN: Soft, nontender, nondistended. Bowel sounds present. No organomegaly or mass.  EXTREMITIES: No cyanosis, clubbing or edema b/l.   Right leg in a immobilizer.  Dressing on right knee with no acute drainage.  NEUROLOGIC: Cranial nerves II through XII are intact. No focal Motor or sensory deficits b/l.  Globally weak PSYCHIATRIC: The patient is alert and oriented x 3.  SKIN: No obvious rash, lesion, or ulcer.   DATA REVIEW:   CBC Recent Labs  Lab 10/09/18 0532  WBC 6.9  HGB 10.7*  HCT 36.6*  PLT 148*    Chemistries  Recent Labs  Lab 10/06/18 1118  10/10/18 0306  NA 137   < > 137  K 4.6   < > 4.4  CL 101   < > 106  CO2 26   < > 24  GLUCOSE 119*   < > 150*  BUN 47*   < > 49*  CREATININE 2.61*   < > 1.61*  CALCIUM 8.9   < > 8.9  AST 25  --   --   ALT 14  --   --   ALKPHOS 69  --   --   BILITOT 0.7  --   --    < > = values in this interval not displayed.    Cardiac Enzymes Recent Labs  Lab 10/06/18 1118  TROPONINI 0.06*    Microbiology Results  Results for orders placed or performed during the hospital encounter of 10/06/18  Urine culture     Status: Abnormal   Collection Time: 10/06/18 11:18 AM  Result Value Ref Range Status   Specimen Description   Final    URINE, RANDOM Performed at [facility],  [address], [place], [place] [address]    Special Requests   Final    NONE Performed at [facility], [address] [place], [place] [address]    Culture MULTIPLE SPECIES PRESENT, SUGGEST RECOLLECTION (A)  Final   Report Status 10/07/2018 FINAL  Final  Blood Culture (routine x 2)     Status: None (Preliminary result)   Collection Time: 10/06/18  1:10 PM  Result Value Ref Range Status   Specimen Description BLOOD LEFT ANTECUBITAL  Final   Special Requests   Final    BOTTLES DRAWN AEROBIC AND ANAEROBIC Blood Culture results may not be optimal due to an excessive volume of blood received in culture bottles   Culture   Final    NO GROWTH 4 DAYS Performed at [facility], [address] [place], [place] [address]    Report Status PENDING  Incomplete  Blood Culture (routine x 2)     Status: None (Preliminary result)   Collection Time: 10/06/18  1:10 PM  Result Value Ref Range Status   Specimen Description BLOOD BLOOD RIGHT HAND  Final   Special Requests   Final    BOTTLES DRAWN AEROBIC AND ANAEROBIC Blood Culture results may not be optimal due to an excessive volume of blood received in culture bottles   Culture   Final  NO GROWTH 4 DAYS Performed at [facility], [address] [place], [place] [address]    Report Status PENDING  Incomplete  Respiratory Panel by PCR     Status: None   Collection Time: 10/07/18 11:29 PM  Result Value Ref Range Status   Adenovirus NOT DETECTED NOT DETECTED Final   Coronavirus 229E NOT DETECTED NOT DETECTED Final   Coronavirus HKU1 NOT DETECTED NOT DETECTED Final   Coronavirus NL63 NOT DETECTED NOT DETECTED Final   Coronavirus OC43 NOT DETECTED NOT DETECTED Final   Metapneumovirus NOT DETECTED NOT DETECTED Final   Rhinovirus / Enterovirus NOT DETECTED NOT DETECTED Final   Influenza A NOT DETECTED NOT DETECTED Final   Influenza B NOT DETECTED NOT DETECTED Final   Parainfluenza Virus 1 NOT DETECTED NOT DETECTED  Final   Parainfluenza Virus 2 NOT DETECTED NOT DETECTED Final   Parainfluenza Virus 3 NOT DETECTED NOT DETECTED Final   Parainfluenza Virus 4 NOT DETECTED NOT DETECTED Final   Respiratory Syncytial Virus NOT DETECTED NOT DETECTED Final   Bordetella pertussis NOT DETECTED NOT DETECTED Final   Chlamydophila pneumoniae NOT DETECTED NOT DETECTED Final   Mycoplasma pneumoniae NOT DETECTED NOT DETECTED Final    Comment: Performed at [facility], [address] [place], [place] [address]    RADIOLOGY:  Dg Chest 2 View  Result Date: 10/08/2018 CLINICAL DATA:  Shortness of breath. EXAM: CHEST - 2 VIEW COMPARISON:  Radiographs of October 07, 2018. FINDINGS: The heart size and mediastinal contours are within normal limits. No pneumothorax or pleural effusion is noted. Stable bibasilar subsegmental atelectasis or scarring is noted. Stable elevated right hemidiaphragm. The visualized skeletal structures are unremarkable. IMPRESSION: Stable bibasilar subsegmental atelectasis or scarring. Electronically Signed   By: Lupita RaiderJames  Green Jr, M.D.   On: 10/08/2018 16:00      Management plans discussed with the patient, family and they are in agreement.  CODE STATUS:     Code Status Orders  (From admission, onward)         Start     Ordered   10/06/18 1638  Full code  Continuous     10/06/18 1640       TOTAL TIME TAKING CARE OF THIS PATIENT: 40 minutes.    Houston SirenSAINANI,Brandon Scarbrough J M.D on 10/10/2018 at 2:39 PM  Between 7am to 6pm - Pager - [phone]  After 6pm go to [email or web address] - Social research officer, governmentpassword EPAS [facility]  [facility]  Office  [phone]  CC: Primary care physician; Marguarite ArbourSparks, [name], MD

## 2018-10-10 NOTE — Progress Notes (Addendum)
Subjective:   S/P ORIF of right distal femur, DOS 08/24/18 Admitted for pneumonia and right incision drainage. Patient reports pain in the right femur as a mild. Patient reports that his cough is much improved today. Wet-to-dry dressing applied to the right leg yesterday. Negative for chest pain and shortness of breath this AM, continues to report cough. Fever: Most recent temp 97.6 Gastrointestinal:Negative for nausea and vomiting  Objective: Vital signs in last 24 hours: Temp:  [97.5 F (36.4 C)-97.9 F (36.6 C)] 97.5 F (36.4 C) (12/30 0744) Pulse Rate:  [62-78] 62 (12/30 0744) Resp:  [18-20] 18 (12/29 1545) BP: (139-153)/(52-90) 139/90 (12/30 0744) SpO2:  [91 %-95 %] 94 % (12/30 0744)  Intake/Output from previous day:  Intake/Output Summary (Last 24 hours) at 10/10/2018 0801 Last data filed at 10/10/2018 0300 Gross per 24 hour  Intake 100 ml  Output 1050 ml  Net -950 ml    Intake/Output this shift: No intake/output data recorded.  Labs: Recent Labs    10/08/18 0527 10/09/18 0532  HGB 9.0* 10.7*   Recent Labs    10/08/18 0527 10/09/18 0532  WBC 7.1 6.9  RBC 3.46* 4.13*  HCT 29.8* 36.6*  PLT 131* 148*   Recent Labs    10/09/18 0532 10/10/18 0306  NA 136 137  K 5.2* 4.4  CL 105 106  CO2 22 24  BUN 39* 49*  CREATININE 1.59* 1.61*  GLUCOSE 230* 150*  CALCIUM 9.0 8.9   No results for input(s): LABPT, INR in the last 72 hours.   EXAM General - Patient is Alert, Appropriate and Oriented Extremity - Patient remains in a knee ROM brace today locked in extension. Dressing removed this AM, there is a area about 0.3 cm in length that had opened but now demonstrates good granulation tissue.  No significant swelling or erythema on today's exam. The opening appears smaller compared to yesterday and appears to be filling in quickly.  Honeycomb dressing was applied over the area today..  Patient was placed back in the knee immobilizer following dressing  change. Motor Function - intact, moving foot and toes well on exam.   Past Medical History:  Diagnosis Date  . Depression   . Diabetes mellitus without complication (HCC)   . Dyspnea   . Elevated lipids   . Femur fracture (HCC) 08/22/2018  . GERD (gastroesophageal reflux disease)   . Hypertension   . Pulmonary emboli (HCC)   . Renal insufficiency    only has 1 kidney  . Sleep apnea   . Stroke (HCC)    Rt side weakness   Assessment/Plan:    Active Problems:   Sepsis (HCC)  Estimated body mass index is 30.18 kg/m as calculated from the following:   Height as of this encounter: 5\' 9"  (1.753 m).   Weight as of this encounter: 92.7 kg. Advance diet   Continue treatment for pneumonia. Honeycomb dressing applied to the right leg, can proceed with standard dressing changes at this time, may need assistance from home health with dressing changes at home. Patient has a history of drop foot on the left leg, states that this is what caused his initial fall.  Will place order for AFO brace for the left foot. Can work on flexion in the right knee up to 70 degrees as tolerated.  DVT Prophylaxis - Xarelto and TED hose Toe-touch weightbearing to the right leg.  Valeria BatmanJ. Lance Alora Gorey, PA-C [facility] 10/10/2018, 8:01 AM

## 2018-10-11 LAB — CULTURE, BLOOD (ROUTINE X 2)
Culture: NO GROWTH
Culture: NO GROWTH

## 2018-10-17 ENCOUNTER — Other Ambulatory Visit: Payer: Self-pay

## 2018-10-17 ENCOUNTER — Inpatient Hospital Stay
Admission: AD | Admit: 2018-10-17 | Discharge: 2018-10-21 | DRG: 856 | Disposition: A | Discharge status: Home Health | Payer: Medicare Other | Attending: Surgery | Admitting: Surgery

## 2018-10-17 DIAGNOSIS — Z961 Presence of intraocular lens: Secondary | ICD-10-CM | POA: Diagnosis present

## 2018-10-17 DIAGNOSIS — Z888 Allergy status to other drugs, medicaments and biological substances status: Secondary | ICD-10-CM

## 2018-10-17 DIAGNOSIS — Z9841 Cataract extraction status, right eye: Secondary | ICD-10-CM | POA: Diagnosis not present

## 2018-10-17 DIAGNOSIS — Z79899 Other long term (current) drug therapy: Secondary | ICD-10-CM

## 2018-10-17 DIAGNOSIS — Z96651 Presence of right artificial knee joint: Secondary | ICD-10-CM | POA: Diagnosis present

## 2018-10-17 DIAGNOSIS — Z9842 Cataract extraction status, left eye: Secondary | ICD-10-CM | POA: Diagnosis not present

## 2018-10-17 DIAGNOSIS — S72456A Nondisplaced supracondylar fracture without intracondylar extension of lower end of unspecified femur, initial encounter for closed fracture: Secondary | ICD-10-CM | POA: Diagnosis present

## 2018-10-17 DIAGNOSIS — Z823 Family history of stroke: Secondary | ICD-10-CM | POA: Diagnosis not present

## 2018-10-17 DIAGNOSIS — Z79891 Long term (current) use of opiate analgesic: Secondary | ICD-10-CM | POA: Diagnosis not present

## 2018-10-17 DIAGNOSIS — Z7952 Long term (current) use of systemic steroids: Secondary | ICD-10-CM | POA: Diagnosis not present

## 2018-10-17 DIAGNOSIS — T8141XA Infection following a procedure, superficial incisional surgical site, initial encounter: Secondary | ICD-10-CM | POA: Diagnosis present

## 2018-10-17 DIAGNOSIS — Z885 Allergy status to narcotic agent status: Secondary | ICD-10-CM

## 2018-10-17 DIAGNOSIS — K219 Gastro-esophageal reflux disease without esophagitis: Secondary | ICD-10-CM | POA: Diagnosis present

## 2018-10-17 DIAGNOSIS — Z7951 Long term (current) use of inhaled steroids: Secondary | ICD-10-CM | POA: Diagnosis not present

## 2018-10-17 DIAGNOSIS — J9601 Acute respiratory failure with hypoxia: Secondary | ICD-10-CM | POA: Diagnosis not present

## 2018-10-17 DIAGNOSIS — T8149XA Infection following a procedure, other surgical site, initial encounter: Secondary | ICD-10-CM

## 2018-10-17 DIAGNOSIS — I6523 Occlusion and stenosis of bilateral carotid arteries: Secondary | ICD-10-CM | POA: Diagnosis present

## 2018-10-17 DIAGNOSIS — Z9049 Acquired absence of other specified parts of digestive tract: Secondary | ICD-10-CM

## 2018-10-17 DIAGNOSIS — Z8249 Family history of ischemic heart disease and other diseases of the circulatory system: Secondary | ICD-10-CM

## 2018-10-17 DIAGNOSIS — Z87891 Personal history of nicotine dependence: Secondary | ICD-10-CM

## 2018-10-17 DIAGNOSIS — D631 Anemia in chronic kidney disease: Secondary | ICD-10-CM | POA: Diagnosis present

## 2018-10-17 DIAGNOSIS — N183 Chronic kidney disease, stage 3 (moderate): Secondary | ICD-10-CM | POA: Diagnosis present

## 2018-10-17 DIAGNOSIS — J441 Chronic obstructive pulmonary disease with (acute) exacerbation: Secondary | ICD-10-CM | POA: Diagnosis present

## 2018-10-17 DIAGNOSIS — E1151 Type 2 diabetes mellitus with diabetic peripheral angiopathy without gangrene: Secondary | ICD-10-CM | POA: Diagnosis present

## 2018-10-17 DIAGNOSIS — Z86711 Personal history of pulmonary embolism: Secondary | ICD-10-CM

## 2018-10-17 DIAGNOSIS — M1030 Gout due to renal impairment, unspecified site: Secondary | ICD-10-CM | POA: Diagnosis present

## 2018-10-17 DIAGNOSIS — Z794 Long term (current) use of insulin: Secondary | ICD-10-CM | POA: Diagnosis not present

## 2018-10-17 DIAGNOSIS — Z8673 Personal history of transient ischemic attack (TIA), and cerebral infarction without residual deficits: Secondary | ICD-10-CM

## 2018-10-17 DIAGNOSIS — S72454A Nondisplaced supracondylar fracture without intracondylar extension of lower end of right femur, initial encounter for closed fracture: Secondary | ICD-10-CM | POA: Diagnosis present

## 2018-10-17 DIAGNOSIS — I129 Hypertensive chronic kidney disease with stage 1 through stage 4 chronic kidney disease, or unspecified chronic kidney disease: Secondary | ICD-10-CM | POA: Diagnosis present

## 2018-10-17 DIAGNOSIS — X58XXXA Exposure to other specified factors, initial encounter: Secondary | ICD-10-CM | POA: Diagnosis present

## 2018-10-17 DIAGNOSIS — Z419 Encounter for procedure for purposes other than remedying health state, unspecified: Secondary | ICD-10-CM

## 2018-10-17 DIAGNOSIS — Z66 Do not resuscitate: Secondary | ICD-10-CM | POA: Diagnosis present

## 2018-10-17 DIAGNOSIS — E1122 Type 2 diabetes mellitus with diabetic chronic kidney disease: Secondary | ICD-10-CM | POA: Diagnosis present

## 2018-10-17 DIAGNOSIS — Z96641 Presence of right artificial hip joint: Secondary | ICD-10-CM | POA: Diagnosis present

## 2018-10-17 DIAGNOSIS — S7290XA Unspecified fracture of unspecified femur, initial encounter for closed fracture: Secondary | ICD-10-CM | POA: Diagnosis present

## 2018-10-17 DIAGNOSIS — S72453A Displaced supracondylar fracture without intracondylar extension of lower end of unspecified femur, initial encounter for closed fracture: Secondary | ICD-10-CM | POA: Diagnosis present

## 2018-10-17 DIAGNOSIS — E785 Hyperlipidemia, unspecified: Secondary | ICD-10-CM | POA: Diagnosis present

## 2018-10-17 DIAGNOSIS — G4730 Sleep apnea, unspecified: Secondary | ICD-10-CM | POA: Diagnosis present

## 2018-10-17 LAB — GLUCOSE, CAPILLARY
Glucose-Capillary: 103 mg/dL — ABNORMAL HIGH (ref 70–99)
Glucose-Capillary: 113 mg/dL — ABNORMAL HIGH (ref 70–99)
Glucose-Capillary: 132 mg/dL — ABNORMAL HIGH (ref 70–99)

## 2018-10-17 LAB — CBC WITH DIFFERENTIAL/PLATELET
Abs Immature Granulocytes: 0.52 10*3/uL — ABNORMAL HIGH (ref 0.00–0.07)
Basophils Absolute: 0 10*3/uL (ref 0.0–0.1)
Basophils Relative: 0 %
Eosinophils Absolute: 0.2 10*3/uL (ref 0.0–0.5)
Eosinophils Relative: 1 %
HCT: 37.1 % — ABNORMAL LOW (ref 39.0–52.0)
Hemoglobin: 11.1 g/dL — ABNORMAL LOW (ref 13.0–17.0)
Immature Granulocytes: 3 %
Lymphocytes Relative: 10 %
Lymphs Abs: 1.9 10*3/uL (ref 0.7–4.0)
MCH: 25.8 pg — ABNORMAL LOW (ref 26.0–34.0)
MCHC: 29.9 g/dL — ABNORMAL LOW (ref 30.0–36.0)
MCV: 86.3 fL (ref 80.0–100.0)
MONOS PCT: 8 %
Monocytes Absolute: 1.4 10*3/uL — ABNORMAL HIGH (ref 0.1–1.0)
Neutro Abs: 14.8 10*3/uL — ABNORMAL HIGH (ref 1.7–7.7)
Neutrophils Relative %: 78 %
Platelets: 224 10*3/uL (ref 150–400)
RBC: 4.3 MIL/uL (ref 4.22–5.81)
RDW: 17.7 % — ABNORMAL HIGH (ref 11.5–15.5)
WBC: 18.9 10*3/uL — ABNORMAL HIGH (ref 4.0–10.5)
nRBC: 0 % (ref 0.0–0.2)

## 2018-10-17 LAB — SURGICAL PCR SCREEN
MRSA, PCR: NEGATIVE
Staphylococcus aureus: NEGATIVE

## 2018-10-17 LAB — BASIC METABOLIC PANEL
Anion gap: 7 (ref 5–15)
BUN: 47 mg/dL — ABNORMAL HIGH (ref 8–23)
CHLORIDE: 103 mmol/L (ref 98–111)
CO2: 30 mmol/L (ref 22–32)
Calcium: 8.7 mg/dL — ABNORMAL LOW (ref 8.9–10.3)
Creatinine, Ser: 1.51 mg/dL — ABNORMAL HIGH (ref 0.61–1.24)
GFR calc Af Amer: 48 mL/min — ABNORMAL LOW (ref >60)
GFR calc non Af Amer: 42 mL/min — ABNORMAL LOW (ref >60)
GLUCOSE: 126 mg/dL — AB (ref 70–99)
Potassium: 4.6 mmol/L (ref 3.5–5.1)
Sodium: 140 mmol/L (ref 135–145)

## 2018-10-17 MED ORDER — DOCUSATE SODIUM 100 MG PO CAPS
100.0000 mg | ORAL_CAPSULE | Freq: Two times a day (BID) | ORAL | Status: DC
  Administered 2018-10-17: 100 mg via ORAL
  Filled 2018-10-17: qty 1

## 2018-10-17 MED ORDER — ACETAMINOPHEN 500 MG PO TABS
500.0000 mg | ORAL_TABLET | Freq: Four times a day (QID) | ORAL | Status: DC | PRN
  Administered 2018-10-17: 500 mg via ORAL
  Filled 2018-10-17: qty 1

## 2018-10-17 MED ORDER — ACETAMINOPHEN 650 MG RE SUPP: 650.0000 mg | Freq: Four times a day (QID) | RECTAL | Status: DC | PRN

## 2018-10-17 MED ORDER — HYDROCODONE-ACETAMINOPHEN 5-325 MG PO TABS
1.0000 | ORAL_TABLET | Freq: Four times a day (QID) | ORAL | Status: DC | PRN
  Administered 2018-10-18 – 2018-10-20 (×6): 1 via ORAL
  Filled 2018-10-17 (×7): qty 1

## 2018-10-17 MED ORDER — ACETAMINOPHEN 325 MG PO TABS: 650.0000 mg | ORAL_TABLET | Freq: Four times a day (QID) | ORAL | Status: DC | PRN

## 2018-10-17 MED ORDER — FLEET ENEMA 7-19 GM/118ML RE ENEM: 1.0000 | ENEMA | Freq: Once | RECTAL | Status: DC | PRN

## 2018-10-17 MED ORDER — MAGNESIUM HYDROXIDE 400 MG/5ML PO SUSP: 30.0000 mL | Freq: Every day | ORAL | Status: DC | PRN

## 2018-10-17 MED ORDER — MUPIROCIN 2 % EX OINT
1.0000 "application " | TOPICAL_OINTMENT | Freq: Two times a day (BID) | CUTANEOUS | Status: DC
  Filled 2018-10-17: qty 22

## 2018-10-17 MED ORDER — CEFAZOLIN SODIUM-DEXTROSE 2-4 GM/100ML-% IV SOLN
2.0000 g | Freq: Once | INTRAVENOUS | Status: DC
  Filled 2018-10-17: qty 100

## 2018-10-17 MED ORDER — KETOROLAC TROMETHAMINE 15 MG/ML IJ SOLN
7.5000 mg | Freq: Four times a day (QID) | INTRAMUSCULAR | Status: DC | PRN
  Administered 2018-10-17 – 2018-10-21 (×9): 7.5 mg via INTRAVENOUS
  Filled 2018-10-17 (×9): qty 1

## 2018-10-17 MED ORDER — BISACODYL 10 MG RE SUPP: 10.0000 mg | Freq: Every day | RECTAL | Status: DC | PRN

## 2018-10-17 MED ORDER — SODIUM CHLORIDE 0.9 % IV SOLN
INTRAVENOUS | Status: DC
  Administered 2018-10-17 – 2018-10-18 (×4): via INTRAVENOUS

## 2018-10-17 MED ORDER — PANTOPRAZOLE SODIUM 40 MG IV SOLR
40.0000 mg | Freq: Every day | INTRAVENOUS | Status: DC
  Administered 2018-10-17: 40 mg via INTRAVENOUS
  Filled 2018-10-17: qty 40

## 2018-10-17 NOTE — H&P (Signed)
Subjective:  Chief complaint: Right distal femoral incision drainage.  The patient is a 85 y.o. male who is now 6 weeks status post an open reduction and internal fixation of a right upper condylar femur fracture above a well-positioned total knee arthroplasty. The patient was admitted 10 days ago for urosepsis. At the time, the wound was noted to have a small area of superficial wound dehiscence which was treated with saline wet-to-dry dressings. He was discharged home on antibiotics and presented the office today for a routine follow-up. He noted that there was still a small amount of purulent drainage coming from the wound, but he denied any fevers or chills.  Examination of the wound demonstrated that the wound actually penetrated down to the hardware.  Therefore, the decision was made to admit the patient in preparation for formal irrigation and debridement of the wound with possible hardware removal.  Patient Active Problem List   Diagnosis Date Noted  . Postoperative wound infection 10/17/2018    Priority: High  . Supracondylar fracture of distal end of femur without intracondylar extension (HCC) 10/17/2018  . Sepsis (HCC) 10/06/2018  . Chronic pulmonary embolism (HCC) 09/06/2018  . Primary osteoarthritis of right knee 09/06/2018  . Status post right knee replacement 09/06/2018  . Dyslipidemia associated with type 2 diabetes mellitus (HCC) 09/06/2018  . CKD stage 3 due to type 2 diabetes mellitus (HCC) 09/06/2018  . Controlled type 2 diabetes mellitus with stage 3 chronic kidney disease, with long-term current use of insulin (HCC) 09/06/2018  . Chronic cerebrovascular accident (CVA) 09/06/2018  . GERD without esophagitis 09/06/2018  . Closed nondisplaced supracondylar fracture of distal end of femur without intracondylar extension (HCC) 09/06/2018  . Acute on chronic anemia 09/06/2018  . Depression, major, single episode, in partial remission (HCC) 09/06/2018  . Bilateral lower  extremity edema 09/06/2018  . Chronic gout due to renal impairment 09/06/2018  . COPD with respiratory failure, acute (HCC) 08/28/2018  . Spondylolisthesis of lumbar region 02/15/2018  . PAD (peripheral artery disease) (HCC) 08/19/2017  . Hyperlipidemia 07/05/2017  . Essential hypertension 07/05/2017  . Bilateral carotid artery stenosis 07/05/2017  . Primary localized osteoarthritis of right hip 03/02/2017   Past Medical History:  Diagnosis Date  . Depression   . Diabetes mellitus without complication (HCC)   . Dyspnea   . Elevated lipids   . Femur fracture (HCC) 08/22/2018  . GERD (gastroesophageal reflux disease)   . Hypertension   . Pulmonary emboli (HCC)   . Renal insufficiency    only has 1 kidney  . Sleep apnea   . Stroke (HCC)    Rt side weakness    Past Surgical History:  Procedure Laterality Date  . 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 AORTA SURGERY    . CAROTID ARTERY - SUBCLAVIAN ARTERY BYPASS GRAFT Bilateral   . CATARACT EXTRACTION W/ INTRAOCULAR LENS  IMPLANT, BILATERAL    . CHOLECYSTECTOMY    . EYE SURGERY    . FEMUR FRACTURE SURGERY Right   . HIP FRACTURE SURGERY Bilateral   . JOINT REPLACEMENT    . ORIF FEMUR FRACTURE Right 08/24/2018   Procedure: OPEN REDUCTION INTERNAL FIXATION (ORIF) SUPRACONDYLAR FEMUR FRACTURE;  Surgeon: Christena FlakePoggi, John J, MD;  Location: [facility] ORS;  Service: Orthopedics;  Laterality: Right;  . PROSTATE SURGERY    . REPLACEMENT TOTAL KNEE Right   . TOTAL HIP ARTHROPLASTY Right 03/02/2017   Procedure: TOTAL HIP ARTHROPLASTY ANTERIOR APPROACH;  Surgeon: Rosita KeaMenz,  Casimiro Needle, MD;  Location: [facility] ORS;  Service: Orthopedics;  Laterality: Right;  Marland Kitchen VASCULAR SURGERY      Medications Prior to Admission  Medication Sig Dispense Refill Last Dose  . acetaminophen (TYLENOL) 325 MG tablet Take 2 tablets (650 mg total) by mouth every 6 (six) hours as needed for mild pain (or Fever >/= 101).   prn at prn  . albuterol  (PROVENTIL) (2.5 MG/3ML) 0.083% nebulizer solution Take 3 mLs (2.5 mg total) by nebulization every 4 (four) hours as needed for wheezing or shortness of breath. 75 mL 0 prn at prn  . allopurinol (ZYLOPRIM) 100 MG tablet Take 0.5 tablets (50 mg total) by mouth every other day. 15 tablet 0 10/05/2018 at 2000  . atorvastatin (LIPITOR) 20 MG tablet Take 1 tablet (20 mg total) by mouth at bedtime. 30 tablet 0 10/05/2018 at 2000  . budesonide (PULMICORT) 0.5 MG/2ML nebulizer solution Take 2 mLs (0.5 mg total) by nebulization 2 (two) times daily. 60 mL 12 10/05/2018 at 2000  . Cholecalciferol 100 MCG (4000 UT) CAPS Take 1 capsule by mouth daily.   Taking  . ferrous sulfate 325 (65 FE) MG EC tablet Take 1 tablet (325 mg total) by mouth 3 (three) times daily with meals. 90 tablet 0 10/05/2018 at 2000  . furosemide (LASIX) 40 MG tablet Take 1 tablet (40 mg total) by mouth at bedtime. 30 tablet 0 10/05/2018 at 2000  . Infant Care Products [facility] EX) Apply liberal amount topically as needed to area of skin irritation.  Ok to leave at bedside   prn at prn  . ipratropium-albuterol (DUONEB) 0.5-2.5 (3) MG/3ML SOLN Take 3 mLs by nebulization 2 (two) times daily. 180 mL 0 10/05/2018 at 2000  . loperamide (IMODIUM A-D) 2 MG tablet Take 2 mg by mouth 4 (four) times daily as needed for diarrhea or loose stools.   prn at prn  . losartan (COZAAR) 25 MG tablet Take 1 tablet (25 mg total) by mouth at bedtime. 30 tablet 0 10/05/2018 at 2000  . metoprolol succinate (TOPROL-XL) 100 MG 24 hr tablet Take 1 tablet (100 mg total) by mouth at bedtime. 30 tablet 0 10/05/2018 at 2000  . mirtazapine (REMERON) 15 MG tablet Take 15 mg by mouth at bedtime.   10/05/2018 at 2000  . multivitamin-lutein (OCUVITE-LUTEIN) CAPS capsule Take 1 capsule by mouth daily.   10/05/2018 at 2000  . NON FORMULARY Diet Type: NAS. NCS   Taking  . NOVOLOG MIX 70/30 FLEXPEN (70-30) 100 UNIT/ML FlexPen Inject 0.25 mLs (25 Units total) into the skin daily.  (Patient taking differently: Inject 25 Units into the skin 2 (two) times daily with a meal. ) 15 mL 0 10/05/2018 at 0800  . nystatin (MYCOSTATIN/NYSTOP) powder Apply topically as directed. Apply with each ostomy change as instructed   prn at prn  . omeprazole (PRILOSEC) 40 MG capsule Take 1 capsule (40 mg total) by mouth daily. 30 capsule 0 10/05/2018 at 2000  . predniSONE (DELTASONE) 10 MG tablet Label  & dispense according to the schedule below. 5 Pills PO for 1 day then, 4 Pills PO for 1 day, 3 Pills PO for 1 day, 2 Pills PO for 1 day, 1 Pill PO for 1 days then STOP. 15 tablet 0   . sertraline (ZOLOFT) 100 MG tablet Take 1 tablet (100 mg total) by mouth at bedtime. 30 tablet 0   . tiotropium (SPIRIVA) 18 MCG inhalation capsule Place 18 mcg into inhaler and inhale daily.  Taking  . traMADol (ULTRAM) 50 MG tablet Take 1 tablet (50 mg total) by mouth every 6 (six) hours as needed for moderate pain. 15 tablet 0   . XARELTO 15 MG TABS tablet Take 1 tablet (15 mg total) by mouth at bedtime. 30 tablet 0 10/05/2018 at 2000   Allergies  Allergen Reactions  . Codeine Anaphylaxis  . Morphine Anaphylaxis  . Gabapentin Other (See Comments)    Unsure of what the reaction was   . Stadol [Butorphanol]     Unknown  . Lyrica [Pregabalin] Rash  . Tapentadol Nausea Only    ONSET 01/04/2013  . Tramadol     Hallucinations "I see giant spiders on ceiling". [place] [facility] [place].    Social History   Tobacco Use  . Smoking status: Former Smoker    Packs/day: 1.00    Years: 7.00    Pack years: 7.00    Types: Cigarettes    Last attempt to quit: 02/18/1952    Years since quitting: 66.7  . Smokeless tobacco: Former Engineer, waterUser  Substance Use Topics  . Alcohol use: No    Family History  Problem Relation Age of Onset  . Heart attack Father   . Stroke Father   . Heart disease Father   . Heart attack Mother      Review of Systems: As noted above. The patient denies any chest  pain, shortness of breath, nausea, vomiting, diarrhea, constipation, belly pain, blood in his/her stool, or burning with urination.  Objective: Temp:  [98.5 F (36.9 C)] 98.5 F (36.9 C) (01/06 1159) Pulse Rate:  [72] 72 (01/06 1159) Resp:  [18] 18 (01/06 1159) BP: (146)/(66) 146/66 (01/06 1159) SpO2:  [96 %] 96 % (01/06 1040) Weight:  [93 kg] 93 kg (01/06 1159)  Physical Exam: General:  Alert, no acute distress Psychiatric:  Patient is competent for consent with normal mood and affect Cardiovascular:  RRR  Respiratory:  Clear to auscultation. No wheezing. Non-labored breathing GI:  Abdomen is soft and non-tender Skin:  No lesions in the area of chief complaint Neurologic:  Sensation intact distally Lymphatic:  No axillary or cervical lymphadenopathy  Orthopedic Exam:  Orthopedic examination is limited to the right lower extremity. Skin inspection of the right thigh demonstrates a 1.5 cm focal area of wound dehiscence with purulent drainage. The surrounding skin shows minimal swelling and no erythema.  He has mild tenderness to palpation around the incision site. Exploration of the wound demonstrates the plate to be readily visible at the depth of the wound. Approximately 3 cc of purulent material was able to be expressed from the wound. The margins of the wound were sharply debrided with a #15 blade. The patient is neurovascular intact to the right lower extremity and foot.  Imaging Review: Recent x-rays of the right distal femur demonstrate excellent interval healing of the right supracondylar femur fracture without loss of alignment. The hardware is in excellent position and without evidence of loosening.  Assessment: Wound infection status post ORIF of right supracondylar femur fracture.  Plan: The treatment options, including both surgical and nonsurgical choices, have been discussed in detail with the patient and his wife. The patient and his wife would like to proceed with  surgical intervention to include a formal irrigation and debridement of the wound with possible hardware removal. The risks (including bleeding, persistent or recurrent infection, malunion and/or nonunion, current fracture, nerve and/or blood vessel injury, persistent or  recurrent pain, loosening or failure of the components, leg length inequality, dislocation, need for further surgery, blood clots, strokes, heart attacks or arrhythmias, pneumonia, etc.) and benefits of the surgical procedure were discussed. The patient states his understanding and agrees to proceed. He agrees to a blood transfusion if necessary. A formal written consent will be obtained by the nursing staff.

## 2018-10-17 NOTE — Anesthesia Preprocedure Evaluation (Deleted)
Anesthesia Evaluation    Airway        Dental   Pulmonary former smoker,           Cardiovascular hypertension,      Neuro/Psych    GI/Hepatic   Endo/Other  diabetes  Renal/GU      Musculoskeletal   Abdominal   Peds  Hematology   Anesthesia Other Findings Past Medical History: No date: Depression No date: Diabetes mellitus without complication (HCC) No date: Dyspnea No date: Elevated lipids 08/22/2018: Femur fracture (HCC) No date: GERD (gastroesophageal reflux disease) No date: Hypertension No date: Pulmonary emboli (HCC) No date: Renal insufficiency     Comment:  only has 1 kidney No date: Sleep apnea No date: Stroke [facility])     Comment:  Rt side weakness   Reproductive/Obstetrics                             Anesthesia Physical Anesthesia Plan Anesthesia Quick Evaluation

## 2018-10-17 NOTE — Care Management Note (Signed)
Case Management Note  Patient Details  Name: Gary Thomas MRN: [id number] Date of Birth: 1931-12-05  Subjective/Objective:                  Met with patient to discuss discharge plan Patient currently uses [facility] for home health and wants to continue using them, for PT and Aide [facility] list provided to the patient  Patient uses [facility]  Patient is able to afford medications Patient still drives when able and has wife Gary Thomas also as a back up for transportation Patient has a walker a cane and a wheel chair, also has a bedside commode also at home    Action/Plan: CMS [facility] list provided to patient per CMS.gov  Expected Discharge Date:                  Expected Discharge Plan:  Experiment  In-House Referral:     Discharge planning Services  CM Consult  Post Acute Care Choice:    Choice offered to:     DME Arranged:    DME Agency:     HH Arranged:  PT [place]:  [facility] (now [facility])  Status of Service:  In process, will continue to follow  If discussed at Long Length of Stay Meetings, dates discussed:    Additional Comments:  Su Hilt, RN 10/17/2018, 12:44 PM

## 2018-10-17 NOTE — Clinical Social Work Note (Signed)
Clinical Social Work Assessment  Patient Details  Name: Gary Thomas MRN: [id number] Date of Birth: 30-Sep-1932  Date of referral:  10/17/18               Reason for consult:  Facility Placement                Permission sought to share information with:    Permission granted to share information::     Name::        Agency::     Relationship::     Contact Information:     Housing/Transportation Living arrangements for the past 2 months:  Single Family Home Source of Information:  Spouse Patient Interpreter Needed:  None Criminal Activity/Legal Involvement Pertinent to Current Situation/Hospitalization:  No - Comment as needed Significant Relationships:  Adult Children, Spouse Lives with:  Spouse Do you feel safe going back to the place where you live?  Yes Need for family participation in patient care:  Yes (Comment)  Care giving concerns:  Patient lives in [place] with his wife Gary Thomas.    Social Worker assessment / plan:  Visual merchandiser (CSW) reviewed chart and noted that patient was a direct admit from Dr. Binnie Rail office today. Per RN patient will have surgery tomorrow. CSW is familiar with patient and his family from previous admissions. CSW contacted patient's wife Gary Thomas. Per wife they are living in [facility] and patient has a hospital bed at home. Per wife patient also has an old urostomy. Wife reported that things are going well at home and she will likely take patient back home. Per wife patient left [facility] in December 2019 and he is in his co-pay days. Per wife patient's supplemental insurance will not pick up the co-pays at [facility]. Per wife they can't afford the $170 per day co-pay at [facility]. Per wife patient is open to [facility] and she would like for him to return home. RN case manager aware is aware of above. CSW will continue to follow and assist as needed.    Employment status:  Disabled (Comment on whether or not currently receiving Disability),  Retired Health and safety inspector:  Medicare PT Recommendations:  Not assessed at this time Information / Referral to community resources:  Skilled Nursing Facility(SNF vs. Home Health )  Patient/Family's Response to care:  Patient's wife prefers for patient to D/C home.   Patient/Family's Understanding of and Emotional Response to Diagnosis, Current Treatment, and Prognosis:  Patient's wife was very pleasant and thanked CSW for assistance.   Emotional Assessment Appearance:  Appears stated age Attitude/Demeanor/Rapport:    Affect (typically observed):    Orientation:  Oriented to Self, Oriented to Place, Oriented to  Time, Oriented to Situation Alcohol / Substance use:  Not Applicable Psych involvement (Current and /or in the community):  No (Comment)  Discharge Needs  Concerns to be addressed:  Discharge Planning Concerns Readmission within the last 30 days:  Yes Current discharge risk:  Dependent with Mobility, Chronically ill Barriers to Discharge:  Continued Medical Work up   [employer], Darleen Crocker, LCSW 10/17/2018, 3:41 PM

## 2018-10-17 NOTE — [place] FL2 (Signed)
[place] MEDICAID FL2 LEVEL OF CARE SCREENING TOOL     IDENTIFICATION  Patient Name: Gary Thomas Birthdate: 08-02-1932 Sex: male Admission Date (Current Location): 10/17/2018  [place] and [place] Number:  Chiropodist and Address:  [facility], [address] [place], [place] [address]      Provider Number: [id number]  Attending Physician Name and Address:  Christena Flake, MD  Relative Name and Phone Number:       Current Level of Care: Hospital Recommended Level of Care: Skilled Nursing Facility Prior Approval Number:    Date Approved/Denied:   PASRR Number: [id number])  Discharge Plan: SNF    Current Diagnoses: Patient Active Problem List   Diagnosis Date Noted  . Postoperative wound infection 10/17/2018  . Supracondylar fracture of distal end of femur without intracondylar extension (HCC) 10/17/2018  . Sepsis (HCC) 10/06/2018  . Chronic pulmonary embolism (HCC) 09/06/2018  . Primary osteoarthritis of right knee 09/06/2018  . Status post right knee replacement 09/06/2018  . Dyslipidemia associated with type 2 diabetes mellitus (HCC) 09/06/2018  . CKD stage 3 due to type 2 diabetes mellitus (HCC) 09/06/2018  . Controlled type 2 diabetes mellitus with stage 3 chronic kidney disease, with long-term current use of insulin (HCC) 09/06/2018  . Chronic cerebrovascular accident (CVA) 09/06/2018  . GERD without esophagitis 09/06/2018  . Closed nondisplaced supracondylar fracture of distal end of femur without intracondylar extension (HCC) 09/06/2018  . Acute on chronic anemia 09/06/2018  . Depression, major, single episode, in partial remission (HCC) 09/06/2018  . Bilateral lower extremity edema 09/06/2018  . Chronic gout due to renal impairment 09/06/2018  . COPD with respiratory failure, acute (HCC) 08/28/2018  . Spondylolisthesis of lumbar region 02/15/2018  . PAD (peripheral artery disease) (HCC) 08/19/2017  . Hyperlipidemia  07/05/2017  . Essential hypertension 07/05/2017  . Bilateral carotid artery stenosis 07/05/2017  . Primary localized osteoarthritis of right hip 03/02/2017    Orientation RESPIRATION BLADDER Height & Weight     Self, Time, Situation, Place  Normal Continent Weight: 205 lb 0.4 oz (93 kg) Height:  5\' 9"  (175.3 cm)  BEHAVIORAL SYMPTOMS/MOOD NEUROLOGICAL BOWEL NUTRITION STATUS      Continent Diet  AMBULATORY STATUS COMMUNICATION OF NEEDS Skin   Extensive Assist Verbally Surgical wounds                       Personal Care Assistance Level of Assistance  Bathing, Feeding, Dressing Bathing Assistance: Limited assistance Feeding assistance: Independent Dressing Assistance: Limited assistance     Functional Limitations Info  Sight, Hearing, Speech Sight Info: Adequate Hearing Info: Adequate Speech Info: Adequate    SPECIAL CARE FACTORS FREQUENCY  PT (By licensed PT), OT (By licensed OT)     PT Frequency: (5) OT Frequency: (5)            Contractures      Additional Factors Info  Code Status, Allergies Code Status Info: (Full Code. ) Allergies Info: (Codeine, Morphine, Gabapentin, Stadol Butorphanol, Lyrica Pregabalin, Tapentadol, Tramadol)           Current Medications (10/17/2018):  This is the current hospital active medication list Current Facility-Administered Medications  Medication Dose Route Frequency Provider Last Rate Last Dose  . 0.9 %  sodium chloride infusion   Intravenous Continuous Poggi, Excell Seltzer, MD      . acetaminophen (TYLENOL) tablet 650 mg  650 mg Oral Q6H PRN Poggi, Excell Seltzer, MD  Or  . acetaminophen (TYLENOL) suppository 650 mg  650 mg Rectal Q6H PRN Poggi, Excell Seltzer, MD      . bisacodyl (DULCOLAX) suppository 10 mg  10 mg Rectal Daily PRN Poggi, Excell Seltzer, MD      . Melene Muller ON 10/18/2018] ceFAZolin (ANCEF) IVPB 2g/100 mL premix  2 g Intravenous Once Poggi, Excell Seltzer, MD      . docusate sodium (COLACE) capsule 100 mg  100 mg Oral BID Poggi, Excell Seltzer, MD       . magnesium hydroxide (MILK OF MAGNESIA) suspension 30 mL  30 mL Oral Daily PRN Poggi, Excell Seltzer, MD      . pantoprazole (PROTONIX) injection 40 mg  40 mg Intravenous QHS Poggi, Excell Seltzer, MD      . sodium phosphate (FLEET) 7-19 GM/118ML enema 1 enema  1 enema Rectal Once PRN Poggi, Excell Seltzer, MD         Discharge Medications: Please see discharge summary for a list of discharge medications.  Relevant Imaging Results:  Relevant Lab Results:   Additional Information (SSN: [id number])  [place], Darleen Crocker, LCSW

## 2018-10-18 ENCOUNTER — Inpatient Hospital Stay: Payer: Medicare Other | Admitting: Anesthesiology

## 2018-10-18 ENCOUNTER — Encounter: Payer: Self-pay | Admitting: Anesthesiology

## 2018-10-18 ENCOUNTER — Encounter
Admission: AD | Disposition: A | Discharge status: Home Health | Payer: Self-pay | Source: Home / Self Care | Attending: Surgery

## 2018-10-18 ENCOUNTER — Inpatient Hospital Stay: Payer: Medicare Other

## 2018-10-18 HISTORY — PX: ORIF FEMUR FRACTURE: SHX2119

## 2018-10-18 HISTORY — PX: I & D EXTREMITY: SHX5045

## 2018-10-18 LAB — URINALYSIS, ROUTINE W REFLEX MICROSCOPIC
Bilirubin Urine: NEGATIVE
Glucose, UA: NEGATIVE mg/dL
Hgb urine dipstick: NEGATIVE
KETONES UR: NEGATIVE mg/dL
Leukocytes, UA: NEGATIVE
NITRITE: NEGATIVE
PROTEIN: 100 mg/dL — AB
Specific Gravity, Urine: 1.017 (ref 1.005–1.030)
pH: 9 — ABNORMAL HIGH (ref 5.0–8.0)

## 2018-10-18 LAB — GLUCOSE, CAPILLARY
Glucose-Capillary: 110 mg/dL — ABNORMAL HIGH (ref 70–99)
Glucose-Capillary: 99 mg/dL (ref 70–99)
Glucose-Capillary: 121 mg/dL — ABNORMAL HIGH (ref 70–99)
Glucose-Capillary: 178 mg/dL — ABNORMAL HIGH (ref 70–99)

## 2018-10-18 SURGERY — IRRIGATION AND DEBRIDEMENT EXTREMITY
Anesthesia: General | Laterality: Right

## 2018-10-18 MED ORDER — FUROSEMIDE 40 MG PO TABS
40.0000 mg | ORAL_TABLET | Freq: Every day | ORAL | Status: DC
  Administered 2018-10-18 – 2018-10-20 (×3): 40 mg via ORAL
  Filled 2018-10-18 (×3): qty 1

## 2018-10-18 MED ORDER — OCUVITE-LUTEIN PO CAPS
1.0000 | ORAL_CAPSULE | Freq: Every day | ORAL | Status: DC
  Administered 2018-10-19 – 2018-10-21 (×3): 1 via ORAL
  Filled 2018-10-18 (×4): qty 1

## 2018-10-18 MED ORDER — ONDANSETRON HCL 4 MG/2ML IJ SOLN: 4.0000 mg | Freq: Four times a day (QID) | INTRAMUSCULAR | Status: DC | PRN

## 2018-10-18 MED ORDER — BISACODYL 10 MG RE SUPP: 10.0000 mg | Freq: Every day | RECTAL | Status: DC | PRN

## 2018-10-18 MED ORDER — VANCOMYCIN HCL IN DEXTROSE 1-5 GM/200ML-% IV SOLN
1000.0000 mg | Freq: Once | INTRAVENOUS | Status: AC
  Administered 2018-10-18: 1000 mg via INTRAVENOUS
  Filled 2018-10-18: qty 200

## 2018-10-18 MED ORDER — FENTANYL CITRATE (PF) 100 MCG/2ML IJ SOLN
INTRAMUSCULAR | Status: AC
  Administered 2018-10-18: 25 ug via INTRAVENOUS
  Filled 2018-10-18: qty 2

## 2018-10-18 MED ORDER — METOCLOPRAMIDE HCL 5 MG/ML IJ SOLN: 5.0000 mg | Freq: Three times a day (TID) | INTRAMUSCULAR | Status: DC | PRN

## 2018-10-18 MED ORDER — FENTANYL CITRATE (PF) 100 MCG/2ML IJ SOLN
25.0000 ug | INTRAMUSCULAR | Status: DC | PRN
  Administered 2018-10-18: 25 ug via INTRAVENOUS

## 2018-10-18 MED ORDER — FENTANYL CITRATE (PF) 100 MCG/2ML IJ SOLN
INTRAMUSCULAR | Status: AC
  Filled 2018-10-18: qty 2

## 2018-10-18 MED ORDER — INSULIN ASPART 100 UNIT/ML ~~LOC~~ SOLN
0.0000 [IU] | Freq: Three times a day (TID) | SUBCUTANEOUS | Status: DC
  Administered 2018-10-19: 2 [IU] via SUBCUTANEOUS
  Administered 2018-10-21: 3 [IU] via SUBCUTANEOUS
  Filled 2018-10-18 (×2): qty 1

## 2018-10-18 MED ORDER — DOCUSATE SODIUM 100 MG PO CAPS
100.0000 mg | ORAL_CAPSULE | Freq: Two times a day (BID) | ORAL | Status: DC
  Administered 2018-10-18 – 2018-10-21 (×6): 100 mg via ORAL
  Filled 2018-10-18 (×6): qty 1

## 2018-10-18 MED ORDER — DEXAMETHASONE SODIUM PHOSPHATE 10 MG/ML IJ SOLN
INTRAMUSCULAR | Status: AC
  Filled 2018-10-18: qty 1

## 2018-10-18 MED ORDER — MAGNESIUM HYDROXIDE 400 MG/5ML PO SUSP
30.0000 mL | Freq: Every day | ORAL | Status: DC | PRN
  Administered 2018-10-20: 30 mL via ORAL
  Filled 2018-10-18: qty 30

## 2018-10-18 MED ORDER — INSULIN ASPART PROT & ASPART (70-30 MIX) 100 UNIT/ML ~~LOC~~ SUSP
25.0000 [IU] | Freq: Every day | SUBCUTANEOUS | Status: DC
  Administered 2018-10-19: 25 [IU] via SUBCUTANEOUS
  Filled 2018-10-18: qty 10

## 2018-10-18 MED ORDER — ACETAMINOPHEN 10 MG/ML IV SOLN
INTRAVENOUS | Status: DC | PRN
  Administered 2018-10-18: 1000 mg via INTRAVENOUS

## 2018-10-18 MED ORDER — SODIUM CHLORIDE 0.9 % IR SOLN
Status: DC | PRN
  Administered 2018-10-18: 1000 mL

## 2018-10-18 MED ORDER — BACITRACIN 50000 UNITS IM SOLR
INTRAMUSCULAR | Status: AC
  Filled 2018-10-18: qty 1

## 2018-10-18 MED ORDER — ONDANSETRON HCL 4 MG/2ML IJ SOLN: 4.0000 mg | Freq: Once | INTRAMUSCULAR | Status: DC | PRN

## 2018-10-18 MED ORDER — INSULIN ASPART 100 UNIT/ML ~~LOC~~ SOLN
0.0000 [IU] | Freq: Every day | SUBCUTANEOUS | Status: DC
  Administered 2018-10-20: 2 [IU] via SUBCUTANEOUS
  Filled 2018-10-18: qty 1

## 2018-10-18 MED ORDER — VITAMIN D 25 MCG (1000 UNIT) PO TABS
4000.0000 [IU] | ORAL_TABLET | Freq: Every day | ORAL | Status: DC
  Administered 2018-10-19 – 2018-10-21 (×3): 4000 [IU] via ORAL
  Filled 2018-10-18 (×3): qty 4

## 2018-10-18 MED ORDER — PANTOPRAZOLE SODIUM 40 MG PO TBEC
40.0000 mg | DELAYED_RELEASE_TABLET | Freq: Every day | ORAL | Status: DC
  Administered 2018-10-19 – 2018-10-21 (×3): 40 mg via ORAL
  Filled 2018-10-18 (×3): qty 1

## 2018-10-18 MED ORDER — FLEET ENEMA 7-19 GM/118ML RE ENEM: 1.0000 | ENEMA | Freq: Once | RECTAL | Status: DC | PRN

## 2018-10-18 MED ORDER — MIDAZOLAM HCL 5 MG/5ML IJ SOLN
INTRAMUSCULAR | Status: DC | PRN
  Administered 2018-10-18: 1 mg via INTRAVENOUS

## 2018-10-18 MED ORDER — SERTRALINE HCL 50 MG PO TABS
100.0000 mg | ORAL_TABLET | Freq: Every day | ORAL | Status: DC
  Administered 2018-10-18 – 2018-10-20 (×3): 100 mg via ORAL
  Filled 2018-10-18 (×3): qty 2

## 2018-10-18 MED ORDER — ONDANSETRON HCL 4 MG/2ML IJ SOLN
INTRAMUSCULAR | Status: DC | PRN
  Administered 2018-10-18: 4 mg via INTRAVENOUS

## 2018-10-18 MED ORDER — ATORVASTATIN CALCIUM 20 MG PO TABS
20.0000 mg | ORAL_TABLET | Freq: Every day | ORAL | Status: DC
  Administered 2018-10-18 – 2018-10-20 (×3): 20 mg via ORAL
  Filled 2018-10-18 (×3): qty 1

## 2018-10-18 MED ORDER — MIRTAZAPINE 15 MG PO TABS
15.0000 mg | ORAL_TABLET | Freq: Every day | ORAL | Status: DC
  Administered 2018-10-18 – 2018-10-20 (×3): 15 mg via ORAL
  Filled 2018-10-18 (×3): qty 1

## 2018-10-18 MED ORDER — DEXAMETHASONE SODIUM PHOSPHATE 10 MG/ML IJ SOLN
INTRAMUSCULAR | Status: DC | PRN
  Administered 2018-10-18: 5 mg via INTRAVENOUS

## 2018-10-18 MED ORDER — LIDOCAINE HCL (PF) 2 % IJ SOLN
INTRAMUSCULAR | Status: AC
  Filled 2018-10-18: qty 10

## 2018-10-18 MED ORDER — PROPOFOL 10 MG/ML IV BOLUS
INTRAVENOUS | Status: AC
  Filled 2018-10-18: qty 20

## 2018-10-18 MED ORDER — FERROUS SULFATE 325 (65 FE) MG PO TABS
325.0000 mg | ORAL_TABLET | Freq: Three times a day (TID) | ORAL | Status: DC
  Administered 2018-10-19 – 2018-10-21 (×9): 325 mg via ORAL
  Filled 2018-10-18 (×8): qty 1

## 2018-10-18 MED ORDER — IPRATROPIUM-ALBUTEROL 0.5-2.5 (3) MG/3ML IN SOLN: 3.0000 mL | Freq: Two times a day (BID) | RESPIRATORY_TRACT | Status: DC

## 2018-10-18 MED ORDER — PROPOFOL 10 MG/ML IV BOLUS
INTRAVENOUS | Status: DC | PRN
  Administered 2018-10-18: 130 mg via INTRAVENOUS
  Administered 2018-10-18: 40 mg via INTRAVENOUS

## 2018-10-18 MED ORDER — BUDESONIDE 0.5 MG/2ML IN SUSP
0.5000 mg | Freq: Two times a day (BID) | RESPIRATORY_TRACT | Status: DC
  Administered 2018-10-18 – 2018-10-21 (×5): 0.5 mg via RESPIRATORY_TRACT
  Filled 2018-10-18 (×5): qty 2

## 2018-10-18 MED ORDER — NEOMYCIN-POLYMYXIN B GU 40-200000 IR SOLN
Status: AC
  Filled 2018-10-18: qty 4

## 2018-10-18 MED ORDER — SODIUM CHLORIDE FLUSH 0.9 % IV SOLN
INTRAVENOUS | Status: AC
  Filled 2018-10-18: qty 10

## 2018-10-18 MED ORDER — ONDANSETRON HCL 4 MG PO TABS: 4.0000 mg | ORAL_TABLET | Freq: Four times a day (QID) | ORAL | Status: DC | PRN

## 2018-10-18 MED ORDER — LIDOCAINE HCL (CARDIAC) PF 100 MG/5ML IV SOSY
PREFILLED_SYRINGE | INTRAVENOUS | Status: DC | PRN
  Administered 2018-10-18: 60 mg via INTRAVENOUS

## 2018-10-18 MED ORDER — RIVAROXABAN 15 MG PO TABS
15.0000 mg | ORAL_TABLET | Freq: Every day | ORAL | Status: DC
  Administered 2018-10-19 – 2018-10-21 (×3): 15 mg via ORAL
  Filled 2018-10-18 (×3): qty 1

## 2018-10-18 MED ORDER — VANCOMYCIN HCL 10 G IV SOLR: 1250.0000 mg | INTRAVENOUS | Status: DC

## 2018-10-18 MED ORDER — DIPHENHYDRAMINE HCL 12.5 MG/5ML PO ELIX: 12.5000 mg | ORAL_SOLUTION | ORAL | Status: DC | PRN

## 2018-10-18 MED ORDER — ALBUTEROL SULFATE (2.5 MG/3ML) 0.083% IN NEBU: 2.5000 mg | INHALATION_SOLUTION | RESPIRATORY_TRACT | Status: DC | PRN

## 2018-10-18 MED ORDER — LOPERAMIDE HCL 2 MG PO CAPS
2.0000 mg | ORAL_CAPSULE | Freq: Four times a day (QID) | ORAL | Status: DC | PRN
  Filled 2018-10-18: qty 1

## 2018-10-18 MED ORDER — DERMACLOUD EX CREA
1.0000 "application " | TOPICAL_CREAM | Freq: Every day | CUTANEOUS | Status: DC | PRN
  Filled 2018-10-18: qty 430

## 2018-10-18 MED ORDER — MIDAZOLAM HCL 2 MG/2ML IJ SOLN
INTRAMUSCULAR | Status: AC
  Filled 2018-10-18: qty 2

## 2018-10-18 MED ORDER — ONDANSETRON HCL 4 MG/2ML IJ SOLN
INTRAMUSCULAR | Status: AC
  Filled 2018-10-18: qty 2

## 2018-10-18 MED ORDER — FENTANYL CITRATE (PF) 100 MCG/2ML IJ SOLN
INTRAMUSCULAR | Status: DC | PRN
  Administered 2018-10-18: 50 ug via INTRAVENOUS
  Administered 2018-10-18: 25 ug via INTRAVENOUS
  Administered 2018-10-18: 50 ug via INTRAVENOUS
  Administered 2018-10-18: 25 ug via INTRAVENOUS
  Administered 2018-10-18: 50 ug via INTRAVENOUS

## 2018-10-18 MED ORDER — ALLOPURINOL 100 MG PO TABS
50.0000 mg | ORAL_TABLET | ORAL | Status: DC
  Administered 2018-10-20: 50 mg via ORAL
  Filled 2018-10-18 (×2): qty 0.5

## 2018-10-18 MED ORDER — METOCLOPRAMIDE HCL 10 MG PO TABS: 5.0000 mg | ORAL_TABLET | Freq: Three times a day (TID) | ORAL | Status: DC | PRN

## 2018-10-18 MED ORDER — IPRATROPIUM-ALBUTEROL 0.5-2.5 (3) MG/3ML IN SOLN
3.0000 mL | Freq: Four times a day (QID) | RESPIRATORY_TRACT | Status: DC
  Administered 2018-10-18 – 2018-10-20 (×7): 3 mL via RESPIRATORY_TRACT
  Filled 2018-10-18 (×7): qty 3

## 2018-10-18 MED ORDER — SODIUM CHLORIDE 0.9 % IV SOLN: INTRAVENOUS | Status: DC

## 2018-10-18 MED ORDER — LOSARTAN POTASSIUM 25 MG PO TABS
25.0000 mg | ORAL_TABLET | Freq: Every day | ORAL | Status: DC
  Administered 2018-10-18 – 2018-10-20 (×3): 25 mg via ORAL
  Filled 2018-10-18 (×3): qty 1

## 2018-10-18 MED ORDER — VANCOMYCIN HCL IN DEXTROSE 1-5 GM/200ML-% IV SOLN: 1000.0000 mg | Freq: Two times a day (BID) | INTRAVENOUS | Status: DC

## 2018-10-18 MED ORDER — METOPROLOL SUCCINATE ER 50 MG PO TB24
100.0000 mg | ORAL_TABLET | Freq: Every day | ORAL | Status: DC
  Administered 2018-10-19: 100 mg via ORAL
  Filled 2018-10-18 (×2): qty 2

## 2018-10-18 MED ORDER — TIOTROPIUM BROMIDE MONOHYDRATE 18 MCG IN CAPS
18.0000 ug | ORAL_CAPSULE | Freq: Every day | RESPIRATORY_TRACT | Status: DC
  Administered 2018-10-20: 18 ug via RESPIRATORY_TRACT
  Filled 2018-10-18: qty 5

## 2018-10-18 MED ORDER — PHENYLEPHRINE HCL 10 MG/ML IJ SOLN
INTRAMUSCULAR | Status: DC | PRN
  Administered 2018-10-18 (×5): 100 ug via INTRAVENOUS

## 2018-10-18 SURGICAL SUPPLY — 61 items
BANDAGE ACE 6X5 VEL STRL LF (GAUZE/BANDAGES/DRESSINGS) ×3 IMPLANT
BLADE SURG SZ10 CARB STEEL (BLADE) ×3 IMPLANT
BNDG COHESIVE 4X5 TAN STRL (GAUZE/BANDAGES/DRESSINGS) ×3 IMPLANT
BNDG COHESIVE 6X5 TAN STRL LF (GAUZE/BANDAGES/DRESSINGS) ×3 IMPLANT
BNDG ESMARK 4X12 TAN STRL LF (GAUZE/BANDAGES/DRESSINGS) ×1 IMPLANT
BUR 2X54 (BURR) ×2 IMPLANT
BUR 4.5X54 (BURR) ×2 IMPLANT
BUR 4.8X51.2 (BURR) ×2 IMPLANT
CANISTER SUCT 1200ML W/VALVE (MISCELLANEOUS) ×3 IMPLANT
CHLORAPREP W/TINT 26ML (MISCELLANEOUS) ×3 IMPLANT
COVER WAND RF STERILE (DRAPES) ×1 IMPLANT
CUFF TOURN 18 STER (MISCELLANEOUS) IMPLANT
CUFF TOURN 24 STER (MISCELLANEOUS) ×2 IMPLANT
DRAPE C-ARM XRAY 36X54 (DRAPES) ×3 IMPLANT
DRAPE C-ARMOR (DRAPES) ×1 IMPLANT
DRAPE IMP U-DRAPE 54X76 (DRAPES) ×6 IMPLANT
DRAPE INCISE IOBAN 66X60 STRL (DRAPES) ×6 IMPLANT
DRAPE TABLE BACK 80X90 (DRAPES) ×3 IMPLANT
DRAPE U-SHAPE 47X51 STRL (DRAPES) ×3 IMPLANT
DRSG VAC ATS LRG SENSATRAC (GAUZE/BANDAGES/DRESSINGS) ×2 IMPLANT
ELECT REM PT RETURN 9FT ADLT (ELECTROSURGICAL) ×3
ELECTRODE REM PT RTRN 9FT ADLT (ELECTROSURGICAL) ×1 IMPLANT
GAUZE PETRO XEROFOAM 1X8 (MISCELLANEOUS) ×1 IMPLANT
GAUZE SPONGE 4X4 12PLY STRL (GAUZE/BANDAGES/DRESSINGS) ×1 IMPLANT
GLOVE BIO SURGEON STRL SZ8 (GLOVE) ×6 IMPLANT
GLOVE INDICATOR 8.0 STRL GRN (GLOVE) ×3 IMPLANT
GLOVE SURG ORTHO 8.5 STRL (GLOVE) ×1 IMPLANT
GOWN STRL REUS W/ TWL LRG LVL3 (GOWN DISPOSABLE) ×1 IMPLANT
GOWN STRL REUS W/ TWL XL LVL3 (GOWN DISPOSABLE) ×1 IMPLANT
GOWN STRL REUS W/TWL LRG LVL3 (GOWN DISPOSABLE) ×2
GOWN STRL REUS W/TWL XL LVL3 (GOWN DISPOSABLE) ×2
HANDPIECE INTERPULSE COAX TIP (DISPOSABLE) ×2
HEMOVAC 400CC 10FR (MISCELLANEOUS) ×1 IMPLANT
KIT TURNOVER KIT A (KITS) ×3 IMPLANT
LABEL OR SOLS (LABEL) ×1 IMPLANT
NDL SAFETY ECLIPSE 18X1.5 (NEEDLE) ×1 IMPLANT
NEEDLE HYPO 18GX1.5 SHARP (NEEDLE) ×2
NS IRRIG 1000ML POUR BTL (IV SOLUTION) ×3 IMPLANT
PACK EXTREMITY ARMC (MISCELLANEOUS) ×3 IMPLANT
PACK HIP PROSTHESIS (MISCELLANEOUS) ×1 IMPLANT
PAD CAST CTTN 4X4 STRL (SOFTGOODS) ×1 IMPLANT
PADDING CAST COTTON 4X4 STRL (SOFTGOODS)
SET HNDPC FAN SPRY TIP SCT (DISPOSABLE) IMPLANT
SPLINT CAST 1 STEP 3X12 (MISCELLANEOUS) ×1 IMPLANT
SPONGE LAP 18X18 RF (DISPOSABLE) ×5 IMPLANT
STAPLER SKIN PROX 35W (STAPLE) ×3 IMPLANT
STOCKINETTE BIAS CUT 4 980044 (GAUZE/BANDAGES/DRESSINGS) ×1 IMPLANT
STOCKINETTE IMPERVIOUS 9X36 MD (GAUZE/BANDAGES/DRESSINGS) ×3 IMPLANT
STOCKINETTE M/LG 89821 (MISCELLANEOUS) ×1 IMPLANT
SUT ETHILON 4-0 (SUTURE)
SUT ETHILON 4-0 FS2 18XMFL BLK (SUTURE)
SUT VIC AB 2-0 CT1 27 (SUTURE) ×4
SUT VIC AB 2-0 CT1 36 (SUTURE) ×1 IMPLANT
SUT VIC AB 2-0 CT1 TAPERPNT 27 (SUTURE) ×2 IMPLANT
SUT VIC AB 4-0 SH 27 (SUTURE)
SUT VIC AB 4-0 SH 27XANBCTRL (SUTURE) ×1 IMPLANT
SUT VICRYL+ 3-0 36IN CT-1 (SUTURE) ×1 IMPLANT
SUTURE ETHLN 4-0 FS2 18XMF BLK (SUTURE) ×1 IMPLANT
SYR 10ML LL (SYRINGE) ×3 IMPLANT
TAPE MICROFOAM 4IN (TAPE) ×1 IMPLANT
WND VAC CANISTER 500ML (MISCELLANEOUS) ×2 IMPLANT

## 2018-10-18 NOTE — Consult Note (Signed)
[employer] - [place] [facility]   PATIENT NAME: Gary Thomas    MR#:  [id number]  DATE OF BIRTH:  11-23-1931  DATE OF ADMISSION:  10/17/2018  PRIMARY CARE PHYSICIAN: Marguarite ArbourSparks, [name], MD   CONSULT REQUESTING/REFERRING PHYSICIAN: Dr. Joice LoftsPoggi  REASON FOR CONSULT: COPD, HTN, DM  CHIEF COMPLAINT:  No chief complaint on file.   HISTORY OF PRESENT ILLNESS:  Gary Thomas  is a 85 y.o. male with a known history of COPD, hypertension, diabetes mellitus, status post open reduction internal fixation of right upper condylar femur fracture 6 weeks back developed a wound which later got infected.  Patient is admitted to the orthopedic service due to purulent drainage from the wound.  He had irrigation and debridement in the operating room.  Presently on the medical floor.  Patient had to be placed on 2 L oxygen after surgery.  He complains of congestion and cough which has been going on for many weeks.  He does not smoke.  Does not use oxygen at home.  Uses nebulizer and inhalers at home.  Patient has been started on a clear liquid diet.  PAST MEDICAL HISTORY:   Past Medical History:  Diagnosis Date  . Depression   . Diabetes mellitus without complication (HCC)   . Dyspnea   . Elevated lipids   . Femur fracture (HCC) 08/22/2018  . GERD (gastroesophageal reflux disease)   . Hypertension   . Pulmonary emboli (HCC)   . Renal insufficiency    only has 1 kidney  . Sleep apnea   . Stroke (HCC)    Rt side weakness    PAST SURGICAL HISTOIRY:   Past Surgical History:  Procedure Laterality Date  . 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 AORTA SURGERY    . CAROTID ARTERY - SUBCLAVIAN ARTERY BYPASS GRAFT Bilateral   . CATARACT EXTRACTION W/ INTRAOCULAR LENS  IMPLANT, BILATERAL    . CHOLECYSTECTOMY    . EYE SURGERY    . FEMUR FRACTURE SURGERY Right   . HIP FRACTURE SURGERY Bilateral   . JOINT REPLACEMENT    . ORIF FEMUR FRACTURE  Right 08/24/2018   Procedure: OPEN REDUCTION INTERNAL FIXATION (ORIF) SUPRACONDYLAR FEMUR FRACTURE;  Surgeon: Christena FlakePoggi, John J, MD;  Location: [facility] ORS;  Service: Orthopedics;  Laterality: Right;  . PROSTATE SURGERY    . REPLACEMENT TOTAL KNEE Right   . TOTAL HIP ARTHROPLASTY Right 03/02/2017   Procedure: TOTAL HIP ARTHROPLASTY ANTERIOR APPROACH;  Surgeon: Thomas BuckerMenz, Michael, MD;  Location: [facility] ORS;  Service: Orthopedics;  Laterality: Right;  Marland Kitchen. VASCULAR SURGERY      SOCIAL HISTORY:   Social History   Tobacco Use  . Smoking status: Former Smoker    Packs/day: 1.00    Years: 7.00    Pack years: 7.00    Types: Cigarettes    Last attempt to quit: 02/18/1952    Years since quitting: 66.7  . Smokeless tobacco: Former Engineer, waterUser  Substance Use Topics  . Alcohol use: No    FAMILY HISTORY:   Family History  Problem Relation Age of Onset  . Heart attack Father   . Stroke Father   . Heart disease Father   . Heart attack Mother     DRUG ALLERGIES:   Allergies  Allergen Reactions  . Codeine Anaphylaxis  . Morphine Anaphylaxis  . Gabapentin Other (See Comments)    Unsure of what the reaction was   . Stadol [Butorphanol]  Unknown  . Lyrica [Pregabalin] Rash  . Tapentadol Nausea Only    ONSET 01/04/2013  . Tramadol     Hallucinations "I see giant spiders on ceiling". Pt also believes he is in [facility], but he is in [place].    REVIEW OF SYSTEMS:   ROS  CONSTITUTIONAL: Fatigue EYES: No blurred or double vision.  EARS, NOSE, AND THROAT: No tinnitus or ear pain.  RESPIRATORY: Cough, wheezing and shortness of breath CARDIOVASCULAR: No chest pain, orthopnea, edema.  GASTROINTESTINAL: No nausea, vomiting, diarrhea or abdominal pain.  GENITOURINARY: No dysuria, hematuria.  ENDOCRINE: No polyuria, nocturia,  HEMATOLOGY: No anemia, easy bruising or bleeding SKIN: No rash or lesion. MUSCULOSKELETAL: Right lower extremity pain from surgery NEUROLOGIC: No tingling, numbness, weakness.   PSYCHIATRY: No anxiety or depression.   MEDICATIONS AT HOME:   Prior to Admission medications   Medication Sig Start Date End Date Taking? Authorizing Provider  acetaminophen (TYLENOL) 325 MG tablet Take 2 tablets (650 mg total) by mouth every 6 (six) hours as needed for mild pain (or Fever >/= 101). 08/30/18   Gouru, Deanna Artis, MD  albuterol (PROVENTIL) (2.5 MG/3ML) 0.083% nebulizer solution Take 3 mLs (2.5 mg total) by nebulization every 4 (four) hours as needed for wheezing or shortness of breath. 09/14/18   Sharee Holster, NP  allopurinol (ZYLOPRIM) 100 MG tablet Take 0.5 tablets (50 mg total) by mouth every other day. 09/14/18   Sharee Holster, NP  atorvastatin (LIPITOR) 20 MG tablet Take 1 tablet (20 mg total) by mouth at bedtime. 09/14/18   Sharee Holster, NP  budesonide (PULMICORT) 0.5 MG/2ML nebulizer solution Take 2 mLs (0.5 mg total) by nebulization 2 (two) times daily. 09/14/18   Sharee Holster, NP  Cholecalciferol 100 MCG (4000 UT) CAPS Take 1 capsule by mouth daily. 08/31/18   [provider]  ferrous sulfate 325 (65 FE) MG EC tablet Take 1 tablet (325 mg total) by mouth 3 (three) times daily with meals. 09/14/18   Sharee Holster, NP  furosemide (LASIX) 40 MG tablet Take 1 tablet (40 mg total) by mouth at bedtime. 09/14/18   Sharee Holster, NP  [employer] [facility] EX) Apply liberal amount topically as needed to area of skin irritation.  Ok to leave at bedside 09/02/18   [provider]  ipratropium-albuterol (DUONEB) 0.5-2.5 (3) MG/3ML SOLN Take 3 mLs by nebulization 2 (two) times daily. 09/14/18   Sharee Holster, NP  loperamide (IMODIUM A-D) 2 MG tablet Take 2 mg by mouth 4 (four) times daily as needed for diarrhea or loose stools. 09/07/18   [provider]  losartan (COZAAR) 25 MG tablet Take 1 tablet (25 mg total) by mouth at bedtime. 09/14/18   Sharee Holster, NP  metoprolol succinate (TOPROL-XL) 100 MG 24 hr tablet Take 1 tablet (100  mg total) by mouth at bedtime. 09/14/18   Sharee Holster, NP  mirtazapine (REMERON) 15 MG tablet Take 15 mg by mouth at bedtime.    [provider]  multivitamin-lutein (OCUVITE-LUTEIN) CAPS capsule Take 1 capsule by mouth daily.    [provider]  NON FORMULARY Diet Type: NAS. NCS    [provider]  NOVOLOG MIX 70/30 FLEXPEN (70-30) 100 UNIT/ML FlexPen Inject 0.25 mLs (25 Units total) into the skin daily. Patient taking differently: Inject 25 Units into the skin 2 (two) times daily with a meal.  09/14/18   Chilton Si, Chong Sicilian, NP  nystatin (MYCOSTATIN/NYSTOP) powder Apply topically  as directed. Apply with each ostomy change as instructed    [provider]  omeprazole (PRILOSEC) 40 MG capsule Take 1 capsule (40 mg total) by mouth daily. 09/14/18   Sharee Holster, NP  predniSONE (DELTASONE) 10 MG tablet Label  & dispense according to the schedule below. 5 Pills PO for 1 day then, 4 Pills PO for 1 day, 3 Pills PO for 1 day, 2 Pills PO for 1 day, 1 Pill PO for 1 days then STOP. 10/10/18   Houston Siren, MD  sertraline (ZOLOFT) 100 MG tablet Take 1 tablet (100 mg total) by mouth at bedtime. 09/14/18   Sharee Holster, NP  tiotropium (SPIRIVA) 18 MCG inhalation capsule Place 18 mcg into inhaler and inhale daily. 08/30/18   [provider]  traMADol (ULTRAM) 50 MG tablet Take 1 tablet (50 mg total) by mouth every 6 (six) hours as needed for moderate pain. 09/14/18   Sharee Holster, NP  XARELTO 15 MG TABS tablet Take 1 tablet (15 mg total) by mouth at bedtime. 09/14/18   Sharee Holster, NP      VITAL SIGNS:  Blood pressure 126/72, pulse 71, temperature 97.6 F (36.4 C), temperature source Axillary, resp. rate 19, height 5\' 9"  (1.753 m), weight 93 kg, SpO2 93 %.  PHYSICAL EXAMINATION:  GENERAL:  85 y.o.-year-old patient lying in the bed with no acute distress.  EYES: Pupils equal, round, reactive to light and accommodation. No scleral icterus.  Extraocular muscles intact.  HEENT: Head atraumatic, normocephalic. Oropharynx and nasopharynx clear.  NECK:  Supple, no jugular venous distention. No thyroid enlargement, no tenderness.  LUNGS: Bilateral wheezing CARDIOVASCULAR: S1, S2 normal. No murmurs, rubs, or gallops.  ABDOMEN: Soft, nontender, nondistended. Bowel sounds present. No organomegaly or mass.  EXTREMITIES: No pedal edema, cyanosis, or clubbing.  NEUROLOGIC: Cranial nerves II through XII are intact. Muscle strength 5/5 in all extremities. Sensation intact. Gait not checked.  PSYCHIATRIC: The patient is alert and oriented x 3.  SKIN: No obvious rash, lesion, or ulcer.  Left lower extremity dressed  LABORATORY PANEL:   CBC Recent Labs  Lab 10/17/18 1501  WBC 18.9*  HGB 11.1*  HCT 37.1*  PLT 224   ------------------------------------------------------------------------------------------------------------------  Chemistries  Recent Labs  Lab 10/17/18 1501  NA 140  K 4.6  CL 103  CO2 30  GLUCOSE 126*  BUN 47*  CREATININE 1.51*  CALCIUM 8.7*   ------------------------------------------------------------------------------------------------------------------  Cardiac Enzymes No results for input(s): TROPONINI in the last 168 hours. ------------------------------------------------------------------------------------------------------------------  RADIOLOGY:  Dg C-arm 1-60 Min  Result Date: 10/18/2018 CLINICAL DATA:  Right leg surgery. EXAM: RIGHT FEMUR 2 VIEWS; DG C-ARM 61-120 MIN COMPARISON:  10/24/2017 FINDINGS: Single fluoroscopic image demonstrates partial visualization of the right knee replacement. The plate and screw fixation in the distal femur has been removed. IMPRESSION: Interval removal of the distal femoral plate and screws. Partial visualization of the knee replacement. Electronically Signed   By: Richarda Overlie M.D.   On: 10/18/2018 16:57   Dg Femur, Min 2 Views Right  Result Date:  10/18/2018 CLINICAL DATA:  Right leg surgery. EXAM: RIGHT FEMUR 2 VIEWS; DG C-ARM 61-120 MIN COMPARISON:  10/24/2017 FINDINGS: Single fluoroscopic image demonstrates partial visualization of the right knee replacement. The plate and screw fixation in the distal femur has been removed. IMPRESSION: Interval removal of the distal femoral plate and screws. Partial visualization of the knee replacement. Electronically Signed   By: Richarda Overlie M.D.   On: 10/18/2018  16:57    EKG:   Orders placed or performed during the hospital encounter of 10/06/18  . ED EKG 12-Lead  . ED EKG 12-Lead  . EKG 12-Lead  . EKG 12-Lead    IMPRESSION AND PLAN:   *COPD with wheezing.  Seems to have chronic congestion and wheezing.  We will continue nebulizers.  Would benefit from short course of steroids but would avoid with infection and lower extremity wound.  Monitor.  Incentive spirometry added as patient saturations were low.  Wean oxygen as tolerated.  *Diabetes mellitus.  Patient is on clear liquids.  Added sliding scale insulin.  Can restart home insulin from tomorrow depending on his blood sugars.  *Hypertension.  Continue home medications  *CKD stage III.  Creatinine stable.  Will stop IV fluids.   All the records are reviewed and case discussed with Consulting provider. Management plans discussed with the patient, family and they are in agreement.  CODE STATUS: DO NOT RESUSCITATE DO NOT INTUBATE  TOTAL TIME TAKING CARE OF THIS PATIENT: 40 minutes.    Molinda BailiffSrikar R Tallulah Hosman M.D on 10/18/2018 at 8:59 PM  Between 7am to 6pm - Pager - [phone]  After 6pm go to [email or web address] - password [email or web address] [facility]  [employer] [facility]  Office  [phone]  CC: Primary care Physician: Marguarite ArbourSparks, [name], MD  Note: This dictation was prepared with Dragon dictation along with smaller phrase technology. Any transcriptional errors that result from this process are unintentional.

## 2018-10-18 NOTE — Transfer of Care (Signed)
Immediate Anesthesia Transfer of Care Note  Patient: Gary Thomas  Procedure(s) Performed: IRRIGATION AND DEBRIDEMENT EXTREMITY, POSSIBLE HARDWARE REMOVAL OF SUPERCONDYLAR PLATE (Right ) HARDWARE REMOVAL- DISTAL FEMUR FRACTURE FIXATION (Right )  Patient Location: PACU  Anesthesia Type:General  Level of Consciousness: sedated  Airway & Oxygen Therapy: Patient Spontanous Breathing and Patient connected to face mask oxygen  Post-op Assessment: Report given to RN and Post -op Vital signs reviewed and stable  Post vital signs: Reviewed and stable  Last Vitals:  Vitals Value Taken Time  BP 148/119 10/18/2018  5:30 PM  Temp    Pulse 70 10/18/2018  5:32 PM  Resp 16 10/18/2018  5:32 PM  SpO2 100 % 10/18/2018  5:32 PM  Vitals shown include unvalidated device data.  Last Pain:  Vitals:   10/18/18 1412  TempSrc: Tympanic  PainSc: 0-No pain      Patients Stated Pain Goal: 3 (10/17/18 1042)  Complications: No apparent anesthesia complications

## 2018-10-18 NOTE — Progress Notes (Signed)
Advance care planning  Purpose of Encounter COPD  Parties in Attendance Patient  Patients Decisional capacity Alert and oriented.  Able to make medical decisions  Patient tells me his healthcare power of attorney is his wife.  Not present at bedside today.  Discussed regarding patient's COPD, treatment prognosis.  CODE STATUS discussed.  Explained intubation/ventilator/CPR and defibrillation.  Patient wishes to be DNR/DNI.  Orders entered and CODE STATUS changed in the computer.   DNR/DNI  Time spent - 17 minutes

## 2018-10-18 NOTE — Progress Notes (Signed)
Assumed care of patient at 0230. Patient resting at this time 

## 2018-10-18 NOTE — Anesthesia Post-op Follow-up Note (Signed)
Anesthesia QCDR form completed.        

## 2018-10-18 NOTE — Anesthesia Procedure Notes (Signed)
Procedure Name: LMA Insertion Date/Time: 10/18/2018 3:12 PM Performed by: Lily Kocher, CRNA Pre-anesthesia Checklist: Patient identified, Patient being monitored, Timeout performed, Emergency Drugs available and Suction available Patient Re-evaluated:Patient Re-evaluated prior to induction Oxygen Delivery Method: Circle system utilized Preoxygenation: Pre-oxygenation with 100% oxygen Induction Type: IV induction Ventilation: Mask ventilation without difficulty LMA: LMA inserted LMA Size: 4.0 Tube type: Oral Number of attempts: 1 Placement Confirmation: positive ETCO2 and breath sounds checked- equal and bilateral Tube secured with: Tape Dental Injury: Teeth and Oropharynx as per pre-operative assessment

## 2018-10-18 NOTE — Anesthesia Preprocedure Evaluation (Signed)
Anesthesia Evaluation  Patient identified by MRN, date of birth, ID band Patient awake    Reviewed: Allergy & Precautions, NPO status , Patient's Chart, lab work & pertinent test results  History of Anesthesia Complications Negative for: history of anesthetic complications  Airway Mallampati: III       Dental  (+) Loose, Missing, Chipped   Pulmonary sleep apnea , neg COPD, neg recent URI, former smoker,           Cardiovascular hypertension, Pt. on medications + Peripheral Vascular Disease  (-) Past MI and (-) CHF (-) dysrhythmias (-) Valvular Problems/Murmurs     Neuro/Psych neg Seizures Depression CVA (memory difficulties)    GI/Hepatic GERD  Medicated,  Endo/Other  diabetes, Type 2, Insulin Dependent  Renal/GU Renal InsufficiencyRenal disease     Musculoskeletal   Abdominal   Peds  Hematology   Anesthesia Other Findings Past Medical History: No date: Depression No date: Diabetes mellitus without complication (HCC) No date: Dyspnea No date: Elevated lipids 08/22/2018: Femur fracture (HCC) No date: GERD (gastroesophageal reflux disease) No date: Hypertension No date: Pulmonary emboli (HCC) No date: Renal insufficiency     Comment:  only has 1 kidney No date: Sleep apnea No date: Stroke (HCC)     Comment:  Rt side weakness   Reproductive/Obstetrics                             Anesthesia Physical  Anesthesia Plan  ASA: III  Anesthesia Plan: General   Post-op Pain Management:    Induction: Intravenous  PONV Risk Score and Plan: 2 and Dexamethasone and Ondansetron  Airway Management Planned: Oral ETT and LMA  Additional Equipment:   Intra-op Plan:   Post-operative Plan: Extubation in OR  Informed Consent: I have reviewed the patients History and Physical, chart, labs and discussed the procedure including the risks, benefits and alternatives for the proposed anesthesia  with the patient or authorized representative who has indicated his/her understanding and acceptance.     Plan Discussed with:   Anesthesia Plan Comments:         Anesthesia Quick Evaluation

## 2018-10-18 NOTE — Op Note (Signed)
10/18/2018  8:03 PM  Patient:   Gary Thomas R Masella  Pre-Op Diagnosis:   Deep wound infection status post ORIF of right supracondylar femur fracture.  Post-Op Diagnosis:   Same  Procedure:   Irrigation and debridement with hardware removal status post prior ORIF of right supracondylar femur fracture.  Surgeon:   Maryagnes AmosJ. Jeffrey Tala Eber, MD  Assistant:   None  Anesthesia:   GET  Findings:   As above.  Complications:   None  Fluids:   850 cc crystalloid  EBL:   50 cc  UOP:   300 cc  TT:   86 minutes at 300 mmHg  Drains:   None  Closure:   2-0 Vicryl subcuticular sutures, then wound VAC  Implants:   None  Brief Clinical Note:   The patient is an 85 year old male who is now 6+ weeks status post an open reduction and internal fixation of an essentially nondisplaced right supracondylar femur fracture.  The patient is status post a prior right total knee arthroplasty.  The patient appeared to be doing well but was admitted 2 weeks ago for urosepsis.  At that time, he was noted to have some drainage from his wound.  This was treated with local dressing changes.  The patient returned to the office yesterday where examination demonstrated persistent wound drainage.  Exploration of the wound demonstrated that it communicated with the hardware.  The patient subsequently was admitted and presents at this time for formal irrigation and debridement of the wound with probable hardware removal.  Procedure:   The patient was brought into the operating room and lain in the supine position.  After adequate general endotracheal intubation and anesthesia were obtained, the patient's right lower extremity was prepped with ChloraPrep solution before being draped sterilely.  Preoperative antibiotics were administered.  A timeout was performed to verify the appropriate surgical site.  The leg was elevated for several minutes before a thigh tourniquet was inflated to 300 mmHg.    The previous incision was reopened  and carried down through the subcutaneous tissues to expose the iliotibial band.  This also was split the length of the incision to expose the plate.  Obvious purulent material was encountered and cultures were obtained and sent off for identification.  Each of the nine screws was removed sequentially using the appropriate screwdriver.  Unfortunately, the most proximal screw stripped and was removed utilizing a metal bur to remove the head so that the plate could be slipped over the top of the screw.  The screw was then removed with special pliers.    The fibrinous rind beneath the plate was removed using a key elevator before the wound was copiously irrigated with an antibiotic irrigation solution using the jet lavage system.  The iliotibial band was loosely reapproximated using #0 Vicryl interrupted sutures before the simultaneous tissues were closed using 2-0 Vicryl interrupted sutures.  A wound VAC was applied over the wound before the leg was placed into a hinged knee brace with a hinges locked in extension.  The patient was then awakened, extubated, and returned to the recovery room in satisfactory condition after tolerating the procedure well.

## 2018-10-18 NOTE — Consult Note (Signed)
Pharmacy Antibiotic Note  Gary Thomas is a 85 y.o. male admitted on 10/17/2018 with wound infection.  Patient had a right distal femoral incision drainage. Pharmacy has been consulted for Vancomycin dosing.  Plan: Will order Vancomycin 1250 mg q24h based on the new AUC protocol starting 1/8 at 1600. Patient received 1 g Vancomycin at 1427. Will order Vancomycin 1 g x now for a total load of 2 g.   Will order peak and trough prior to 4th dose.   Vancomycin Kinetics Using adj BW  ke 0.038 T1/2 18.0 hrs Vd 67.0 Calculated AUC 486   Height: 5\' 9"  (175.3 cm) Weight: 205 lb 0.4 oz (93 kg) IBW/kg (Calculated) : 70.7  Temp (24hrs), Avg:98.3 F (36.8 C), Min:97.8 F (36.6 C), Max:98.8 F (37.1 C)  Recent Labs  Lab 10/17/18 1501  WBC 18.9*  CREATININE 1.51*    Estimated Creatinine Clearance: 41 mL/min (A) (by C-G formula based on SCr of 1.51 mg/dL (H)).    Allergies  Allergen Reactions  . Codeine Anaphylaxis  . Morphine Anaphylaxis  . Gabapentin Other (See Comments)    Unsure of what the reaction was   . Stadol [Butorphanol]     Unknown  . Lyrica [Pregabalin] Rash  . Tapentadol Nausea Only    ONSET 01/04/2013  . Tramadol     Hallucinations "I see giant spiders on ceiling". Pt also [place] [facility], but [place].    Antimicrobials this admission: 1/7 Vancomycin >>  Dose adjustments this admission: N/A  Microbiology results: 1/7 Wound Cx pending  1/7 MRSA PCR: (-)  Thank you for allowing pharmacy to be a part of this patient's care.  Mauri ReadingSavanna M Martin, PharmD Pharmacy Resident  10/18/2018 7:29 PM

## 2018-10-19 ENCOUNTER — Encounter: Payer: Self-pay | Admitting: Surgery

## 2018-10-19 LAB — CBC WITH DIFFERENTIAL/PLATELET
Abs Immature Granulocytes: 0.25 10*3/uL — ABNORMAL HIGH (ref 0.00–0.07)
Basophils Absolute: 0 10*3/uL (ref 0.0–0.1)
Basophils Relative: 0 %
EOS PCT: 0 %
Eosinophils Absolute: 0 10*3/uL (ref 0.0–0.5)
HCT: 31.2 % — ABNORMAL LOW (ref 39.0–52.0)
Hemoglobin: 9.3 g/dL — ABNORMAL LOW (ref 13.0–17.0)
Immature Granulocytes: 2 %
Lymphocytes Relative: 3 %
Lymphs Abs: 0.5 10*3/uL — ABNORMAL LOW (ref 0.7–4.0)
MCH: 25.5 pg — ABNORMAL LOW (ref 26.0–34.0)
MCHC: 29.8 g/dL — ABNORMAL LOW (ref 30.0–36.0)
MCV: 85.5 fL (ref 80.0–100.0)
Monocytes Absolute: 0.8 10*3/uL (ref 0.1–1.0)
Monocytes Relative: 5 %
NRBC: 0 % (ref 0.0–0.2)
Neutro Abs: 15.2 10*3/uL — ABNORMAL HIGH (ref 1.7–7.7)
Neutrophils Relative %: 90 %
Platelets: 152 10*3/uL (ref 150–400)
RBC: 3.65 MIL/uL — ABNORMAL LOW (ref 4.22–5.81)
RDW: 17 % — ABNORMAL HIGH (ref 11.5–15.5)
WBC: 16.8 10*3/uL — AB (ref 4.0–10.5)

## 2018-10-19 LAB — GLUCOSE, CAPILLARY
GLUCOSE-CAPILLARY: 133 mg/dL — AB (ref 70–99)
GLUCOSE-CAPILLARY: 153 mg/dL — AB (ref 70–99)
Glucose-Capillary: 110 mg/dL — ABNORMAL HIGH (ref 70–99)
Glucose-Capillary: 102 mg/dL — ABNORMAL HIGH (ref 70–99)

## 2018-10-19 LAB — BASIC METABOLIC PANEL
Anion gap: 7 (ref 5–15)
BUN: 36 mg/dL — ABNORMAL HIGH (ref 8–23)
CHLORIDE: 105 mmol/L (ref 98–111)
CO2: 25 mmol/L (ref 22–32)
Calcium: 8.2 mg/dL — ABNORMAL LOW (ref 8.9–10.3)
Creatinine, Ser: 1.42 mg/dL — ABNORMAL HIGH (ref 0.61–1.24)
GFR calc Af Amer: 52 mL/min — ABNORMAL LOW (ref >60)
GFR calc non Af Amer: 45 mL/min — ABNORMAL LOW (ref >60)
Glucose, Bld: 161 mg/dL — ABNORMAL HIGH (ref 70–99)
Potassium: 4.6 mmol/L (ref 3.5–5.1)
SODIUM: 137 mmol/L (ref 135–145)

## 2018-10-19 MED ORDER — CYCLOBENZAPRINE HCL 10 MG PO TABS
5.0000 mg | ORAL_TABLET | Freq: Three times a day (TID) | ORAL | Status: DC | PRN
  Administered 2018-10-19 – 2018-10-20 (×4): 5 mg via ORAL
  Filled 2018-10-19 (×4): qty 1

## 2018-10-19 MED ORDER — VANCOMYCIN HCL IN DEXTROSE 750-5 MG/150ML-% IV SOLN
750.0000 mg | INTRAVENOUS | Status: DC
  Administered 2018-10-19 – 2018-10-20 (×2): 750 mg via INTRAVENOUS
  Filled 2018-10-19 (×4): qty 150

## 2018-10-19 NOTE — Consult Note (Signed)
Pharmacy Antibiotic Note  Gary Thomas is a 85 y.o. male admitted on 10/17/2018 with wound infection.  Patient had a right distal femoral incision drainage. Pharmacy has been consulted for Vancomycin dosing.  Plan: AUC protocol correction - modify vancomycin dose to 750 mg IV Q24H based on calculated AUC 466 (CrCl and Ke calculated using IBW) and Vd coefficient 0.5 d/t BMI >30. Pharmacy will continue to follow and adjust based on levels.   Height: 5\' 9"  (175.3 cm) Weight: 205 lb 0.4 oz (93 kg) IBW/kg (Calculated) : 70.7  Temp (24hrs), Avg:97.8 F (36.6 C), Min:96.6 F (35.9 C), Max:98.8 F (37.1 C)  Recent Labs  Lab 10/17/18 1501 10/19/18 0431  WBC 18.9* 16.8*  CREATININE 1.51* 1.42*    Estimated Creatinine Clearance: 43.6 mL/min (A) (by C-G formula based on SCr of 1.42 mg/dL (H)).    Allergies  Allergen Reactions  . Codeine Anaphylaxis  . Morphine Anaphylaxis  . Gabapentin Other (See Comments)    Unsure of what the reaction was   . Stadol [Butorphanol]     Unknown  . Lyrica [Pregabalin] Rash  . Tapentadol Nausea Only    ONSET 01/04/2013  . Tramadol     Hallucinations "I see giant spiders on ceiling". Pt also believes he is in [facility], but he is in [place].    Antimicrobials this admission: 1/7 Vancomycin >>  Dose adjustments this admission: N/A  Microbiology results: 1/7 Wound Cx pending  1/7 MRSA PCR: (-)  Thank you for allowing pharmacy to be a part of this patient's care.  Carola Frost, PharmD, BCPS Clinical Pharmacist 10/19/2018 7:40 AM

## 2018-10-19 NOTE — Evaluation (Signed)
Physical Therapy Evaluation Patient Details Name: Gary Thomas MRN: [id number] DOB: [birth year] Today's Date: 10/19/2018   History of Present Illness  Pt is 85 yo male s/p ORIF of R upper condylar femur fx, developed infection, presented to ED with SOB and fever; underwent I&D and hardware removal 10/18/2018. PMH of DM, HTN, pulmonary emboli, renal insufficiency (1kidney) stroke (R sided weakness)    Clinical Impression  Pt alert, oriented agreeable to PT, R knee pain 8/10 pain, premedicated before session, R knee brace on and locked in extension. Pt able to verbalize NWB on RLE without prompting. Pt reported living with wife who is available 24/7 in one story house, who has HHPT and an aide x2 a week (to assist with bathing). Wife assists with bed pan/foley care and pt performs lateral transfers in [facility] with PT and/or son.  The patient was able to perform therapeutic exercises on bilateral LE (R knee locked in extension with brace) with minimum verbal cues and visual cues. Pt sat EOB with minAx1 and use of bed rails/HOB elevated. First attempt with lateral/scoot transfer with mod-maxAx1, unable to achieve.  ModAx2 and extensive verbal cueing to scoot to bedside chair with good adherence to precautions. The patient demonstrated limitations (see "PT Problem List") that impede the pt's ability to perform functional activities, safety, and mobility and would benefit from skilled PT intervention. Recommendation is HHPT with supervision/assistance 24/7.     Follow Up Recommendations Home health PT;Supervision/Assistance - 24 hour    Equipment Recommendations  None recommended by PT;Other (comment)(Pt reported having RW, WC, hospital bed, bedside commode)    Recommendations for Other Services       Precautions / Restrictions Precautions Precautions: Knee;Fall Precaution Booklet Issued: No Required Braces or Orthoses: Knee Immobilizer - Right Knee Immobilizer - Right: On at all times;Other  (comment)(locked in extension) Restrictions Weight Bearing Restrictions: Yes RLE Weight Bearing: Non weight bearing      Mobility  Bed Mobility Overal bed mobility: Needs Assistance Bed Mobility: Supine to Sit;Sidelying to Sit;Sit to Supine   Sidelying to sit: Min assist Supine to sit: Min assist Sit to supine: Min assist      Transfers Overall transfer level: Needs assistance   Transfers: Lateral/Scoot Transfers          Lateral/Scoot Transfers: Mod assist;+2 physical assistance;With slide board;From elevated surface    Ambulation/Gait                Stairs            Wheelchair Mobility    Modified Rankin (Stroke Patients Only)       Balance Overall balance assessment: Needs assistance Sitting-balance support: Feet supported;Single extremity supported Sitting balance-Leahy Scale: Fair                                       Pertinent Vitals/Pain Pain Assessment: 0-10 Pain Location: R knee Pain Descriptors / Indicators: Aching;Burning;Throbbing Pain Intervention(s): Repositioned;Monitored during session;Premedicated before session;Limited activity within patient's tolerance    Home Living Family/patient expects to be discharged to:: Private residence Living Arrangements: Spouse/significant other Available Help at Discharge: Family;Available 24 hours/day;Personal care attendant;Home health Type of Home: House Home Access: Ramped entrance     Home Layout: One level Home Equipment: Walker - 2 wheels;Cane - single point;Shower seat;Bedside commode;Hospital bed;Wheelchair - manual      Prior Function Level of Independence: Needs assistance   Gait /  Transfers Assistance Needed: slide board transfers with HHPT, otherwise in bed  ADL's / Homemaking Assistance Needed: wife assists with bed pans, foley care. Aide x2 a week for bathing  Comments: Pt reported that he has not stood/walked since he fell in November. performs lateral  transfers at home with assistance (HHPT)     Hand Dominance        Extremity/Trunk Assessment   Upper Extremity Assessment Upper Extremity Assessment: Overall WFL for tasks assessed    Lower Extremity Assessment Lower Extremity Assessment: Generalized weakness;RLE deficits/detail RLE: Unable to fully assess due to immobilization       Communication   Communication: HOH  Cognition Arousal/Alertness: Awake/alert Behavior During Therapy: WFL for tasks assessed/performed Overall Cognitive Status: Within Functional Limits for tasks assessed                                        General Comments      Exercises General Exercises - Lower Extremity Ankle Circles/Pumps: AAROM;Both;15 reps Quad Sets: AROM;Strengthening;Both;15 reps Gluteal Sets: AROM;Strengthening;Both;15 reps Heel Slides: AROM;Left;10 reps Hip ABduction/ADduction: AROM;Right;10 reps   Assessment/Plan    PT Assessment Patient needs continued PT services  PT Problem List Decreased strength;Pain;Decreased range of motion;Decreased activity tolerance;Decreased knowledge of use of DME;Decreased balance;Decreased safety awareness;Decreased mobility;Decreased knowledge of precautions       PT Treatment Interventions DME instruction;Therapeutic exercise;Gait training;Balance training;Neuromuscular re-education;Functional mobility training;Therapeutic activities;Patient/family education    PT Goals (Current goals can be found in the Care Plan section)  Acute Rehab PT Goals Patient Stated Goal: to decrease pain PT Goal Formulation: With patient Time For Goal Achievement: 11/02/18 Potential to Achieve Goals: Good    Frequency BID   Barriers to discharge        Co-evaluation               AM-PAC PT "6 Clicks" Mobility  Outcome Measure Help needed turning from your back to your side while in a flat bed without using bedrails?: A Little Help needed moving from lying on your back to  sitting on the side of a flat bed without using bedrails?: A Little Help needed moving to and from a bed to a chair (including a wheelchair)?: A Lot Help needed standing up from a chair using your arms (e.g., wheelchair or bedside chair)?: Total Help needed to walk in hospital room?: Total Help needed climbing 3-5 steps with a railing? : Total 6 Click Score: 11    End of Session Equipment Utilized During Treatment: Gait belt Activity Tolerance: Patient limited by fatigue;Patient limited by pain Patient left: in chair;with chair alarm set;with family/visitor present;with call bell/phone within reach;with SCD's reapplied Nurse Communication: Mobility status PT Visit Diagnosis: Difficulty in walking, not elsewhere classified (R26.2);Other abnormalities of gait and mobility (R26.89);Muscle weakness (generalized) (M62.81);Pain Pain - Right/Left: Right Pain - part of body: Knee    Time: 2633-3545 PT Time Calculation (min) (ACUTE ONLY): 42 min   Charges:   PT Evaluation $PT Eval Moderate Complexity: 1 Mod PT Treatments $Therapeutic Exercise: 8-22 mins $Therapeutic Activity: 8-22 mins       Olga Coaster PT, DPT 9:58 AM,10/19/18 [phone]

## 2018-10-19 NOTE — Progress Notes (Signed)
Physical Therapy Treatment Patient Details Name: Gary Thomas MRN: [id number] DOB: 10/06/1932 Today's Date: 10/19/2018    History of Present Illness Pt is 85 yo male s/p ORIF of R upper condylar femur fx, developed infection, presented to ED with SOB and fever; underwent I&D and hardware removal 10/18/2018. PMH of DM, HTN, pulmonary emboli, renal insufficiency (1kidney) stroke (R sided weakness)    PT Comments    Patient alert, agreeable to PT, very eager to return to bed to "get some rest". Utilized lateral/scoot transfer from chair to bed pt attempted to participate as much as possible, though maxAx2 for success. Despite encouragement, pt did not want to perform any exercises this session due to fatigue and requesting to rest. Pt in bed with all needs in reach. Current plan remains appropriate.     Follow Up Recommendations  Home health PT;Supervision/Assistance - 24 hour     Equipment Recommendations  None recommended by PT;Other (comment)    Recommendations for Other Services       Precautions / Restrictions Precautions Precautions: Knee;Fall Precaution Booklet Issued: No Required Braces or Orthoses: Knee Immobilizer - Right Knee Immobilizer - Right: On at all times;Other (comment)(locked in extension) Restrictions Weight Bearing Restrictions: Yes RLE Weight Bearing: Non weight bearing    Mobility  Bed Mobility   Bed Mobility: Sit to Supine;Rolling   Sidelying to sit: Min guard Supine to sit: Min assist;+2 for physical assistance        Transfers Overall transfer level: Needs assistance   Transfers: Lateral/Scoot Transfers          Lateral/Scoot Transfers: Max assist;Mod assist;+2 physical assistance General transfer comment: MaxAx1, modAx1 to laterally transfer pt back to bed. Repositioned with mniAx1, pt able to fully participate.   Ambulation/Gait                 Stairs             Wheelchair Mobility    Modified Rankin (Stroke  Patients Only)       Balance Overall balance assessment: Needs assistance Sitting-balance support: Feet supported Sitting balance-Leahy Scale: Fair                                      Cognition Arousal/Alertness: Awake/alert Behavior During Therapy: WFL for tasks assessed/performed Overall Cognitive Status: Within Functional Limits for tasks assessed                                        Exercises      General Comments        Pertinent Vitals/Pain Pain Assessment: 0-10 Pain Score: 5  Pain Location: R knee Pain Descriptors / Indicators: Aching;Burning;Throbbing Pain Intervention(s): Repositioned;Monitored during session    Home Living                      Prior Function            PT Goals (current goals can now be found in the care plan section) Progress towards PT goals: Progressing toward goals    Frequency    BID      PT Plan Current plan remains appropriate    Co-evaluation              AM-PAC PT "6 Clicks" Mobility   Outcome Measure  Help needed turning from your back to your side while in a flat bed without using bedrails?: A Little Help needed moving from lying on your back to sitting on the side of a flat bed without using bedrails?: A Little Help needed moving to and from a bed to a chair (including a wheelchair)?: A Lot Help needed standing up from a chair using your arms (e.g., wheelchair or bedside chair)?: Total Help needed to walk in hospital room?: Total Help needed climbing 3-5 steps with a railing? : Total 6 Click Score: 11    End of Session Equipment Utilized During Treatment: Gait belt Activity Tolerance: Patient limited by fatigue;Patient limited by pain Patient left: in bed;with call bell/phone within reach;with SCD's reapplied;with bed alarm set Nurse Communication: Mobility status PT Visit Diagnosis: Difficulty in walking, not elsewhere classified (R26.2);Other abnormalities of  gait and mobility (R26.89);Muscle weakness (generalized) (M62.81);Pain Pain - Right/Left: Right Pain - part of body: Knee     Time: [id number] PT Time Calculation (min) (ACUTE ONLY): 18 min  Charges:  $Therapeutic Activity: 8-22 mins                     Olga Coasteriana Mikkel Charrette PT, DPT 3:51 PM,10/19/18 [phone]

## 2018-10-19 NOTE — Care Management (Signed)
RNCM spoke with Micah Noel PA at [phone] and notified that [facility] order form will be placed on the chart for him to complete and sign

## 2018-10-19 NOTE — Progress Notes (Signed)
Initial Nutrition Assessment  DOCUMENTATION CODES:   Obesity unspecified  INTERVENTION:  Glucerna Shake po BID with meal trays, each supplement provides 220 kcal and 10 grams of protein. Patient prefers chocolate or strawberry.  Premier Protein po once daily with meal trays, each supplement provides 160 kcal and 30 grams of protein. Patient prefers chocolate.  Continue Ocuvite daily. This will help with healing of incision.  Consider increasing bowel regimen. Patient's last BM was a type 1 ([facility] Stool chart) on 10/17/2017.  NUTRITION DIAGNOSIS:   Increased nutrient needs related to post-op healing as evidenced by estimated needs.  GOAL:   Patient will meet greater than or equal to 90% of their needs  MONITOR:   PO intake, Supplement acceptance, Labs, Weight trends, Skin, I & O's  REASON FOR ASSESSMENT:   Malnutrition Screening Tool    ASSESSMENT:   85 year old male with PMHx of pulmonary emboli, HTN, depression, DM, GERD, renal insufficiency (has one kidney), hx CVA, sleep apnea who is admitted with deep wound infection s/p ORIF of right supracondylar femur fracture on 08/24/2018 now s/p I&D with hardware removal and placement of wound VAC on 1/7.   Met with patient at bedside. He reports his appetite is "okay" here and was "fair" PTA. He did eat 100% of his breakfast this morning. He is unable to describe exactly how he typically eats at home. He only reports that over the past year or so he is eating less than usual at meals. He has had multiple surgeries over the past year or so and reports his appetite has never returned to what it was.   Patient reports his UBW was 230 lbs. He has had unintentional weight loss of approximately 25 lbs over time. Per chart the last time he weighed around 230 lbs was mid 2018 so this weight loss has been over >1 year.  Meal Completion: 100% of breakfast this morning  Medications reviewed and include: allopurinol, vitamin D3 4000 units  daily, Colace 100 mg BID, ferrous sulfate 325 mg TD, Lasix 40 mg daily, Novolog 0-15 units TID, Novolog 0-5 units QHS, Novolog Mix 70/30 25 units daily, Remeron 15 mg QHS, Ocuvite daily, pantoprazole, sertraline, vancomycin.  Labs reviewed: CBG 99-178, BUN 36, Creatinine 1.42.  Patient is at risk for development of malnutrition. Encouraged adequate intake of calories and protein for adequate healing and to prevent further unintentional weight loss/loss of lean body mass.  NUTRITION - FOCUSED PHYSICAL EXAM:    Most Recent Value  Orbital Region  No depletion  Upper Arm Region  Mild depletion  Thoracic and Lumbar Region  No depletion  Buccal Region  No depletion  Temple Region  Mild depletion  Clavicle Bone Region  Mild depletion  Clavicle and Acromion Bone Region  Mild depletion  Scapular Bone Region  Unable to assess  Dorsal Hand  No depletion  Patellar Region  No depletion  Anterior Thigh Region  No depletion  Posterior Calf Region  No depletion  Edema (RD Assessment)  Mild  Hair  Reviewed  Eyes  Reviewed  Mouth  Reviewed  Skin  Reviewed  Nails  Reviewed     Diet Order:   Diet Order            Diet Carb Modified Fluid consistency: Thin; Room service appropriate? Yes  Diet effective now             EDUCATION NEEDS:   Education needs have been addressed  Skin:  Skin Assessment: Skin Integrity  Issues:(closed incision to right leg with wound VAC in place)  Last BM:  10/17/2018 - large type 1  Height:   Ht Readings from Last 1 Encounters:  10/17/18 '5\' 9"'$  (1.753 m)   Weight:   Wt Readings from Last 1 Encounters:  10/17/18 93 kg   Ideal Body Weight:  72.7 kg  BMI:  Body mass index is 30.28 kg/m.  Estimated Nutritional Needs:   Kcal:  1900-2100  Protein:  95-105 grams  Fluid:  1.9-2.1 L/day  Willey Blade, MS, RD, LDN Office: [phone] Pager: [phone] After Hours/Weekend Pager: [phone]

## 2018-10-19 NOTE — Progress Notes (Signed)
[employer] - [place] [facility]   PATIENT NAME: Gary KennedyDanford Thomas    MR#:  [id number]  DATE OF BIRTH:  01/18/1932  SUBJECTIVE:  CHIEF COMPLAINT:  No chief complaint on file.  No shortness of breath.  Has cough and chest congestion.  On 2 L oxygen. Pain right lower extremity. Wife at bedside.  Sitting in a chair Afebrile  REVIEW OF SYSTEMS:    Review of Systems  Constitutional: Positive for malaise/fatigue. Negative for chills and fever.  HENT: Negative for sore throat.   Eyes: Negative for blurred vision, double vision and pain.  Respiratory: Positive for cough and wheezing. Negative for hemoptysis and shortness of breath.   Cardiovascular: Negative for chest pain, palpitations, orthopnea and leg swelling.  Gastrointestinal: Negative for abdominal pain, constipation, diarrhea, heartburn, nausea and vomiting.  Genitourinary: Negative for dysuria and hematuria.  Musculoskeletal: Positive for joint pain. Negative for back pain.  Skin: Negative for rash.  Neurological: Negative for sensory change, speech change, focal weakness and headaches.  Endo/Heme/Allergies: Does not bruise/bleed easily.  Psychiatric/Behavioral: Negative for depression. The patient is not nervous/anxious.    DRUG ALLERGIES:   Allergies  Allergen Reactions  . Codeine Anaphylaxis  . Morphine Anaphylaxis  . Gabapentin Other (See Comments)    Unsure of what the reaction was   . Stadol [Butorphanol]     Unknown  . Lyrica [Pregabalin] Rash  . Tapentadol Nausea Only    ONSET 01/04/2013  . Tramadol     Hallucinations "I see giant spiders on ceiling". [place]    VITALS:  Blood pressure (!) 131/55, pulse 69, temperature 97.6 F (36.4 C), temperature source Axillary, resp. rate 19, height 5\' 9"  (1.753 m), weight 93 kg, SpO2 100 %.  PHYSICAL EXAMINATION:   Physical Exam  GENERAL:  85 y.o.-year-old patient lying in the bed with no acute  distress.  EYES: Pupils equal, round, reactive to light and accommodation. No scleral icterus. Extraocular muscles intact.  HEENT: Head atraumatic, normocephalic. Oropharynx and nasopharynx clear.  NECK:  Supple, no jugular venous distention. No thyroid enlargement, no tenderness.  LUNGS: Minimal wheezing.  Good air entry. CARDIOVASCULAR: S1, S2 normal. No murmurs, rubs, or gallops.  ABDOMEN: Soft, nontender, nondistended. Bowel sounds present. No organomegaly or mass.  EXTREMITIES: Dressing and brace right lower extremity NEUROLOGIC: Cranial nerves II through XII are intact. No focal Motor or sensory deficits b/l.   PSYCHIATRIC: The patient is alert and oriented x 3.  SKIN: No obvious rash, lesion, or ulcer.   LABORATORY PANEL:   CBC Recent Labs  Lab 10/19/18 0431  WBC 16.8*  HGB 9.3*  HCT 31.2*  PLT 152   ------------------------------------------------------------------------------------------------------------------ Chemistries  Recent Labs  Lab 10/19/18 0431  NA 137  K 4.6  CL 105  CO2 25  GLUCOSE 161*  BUN 36*  CREATININE 1.42*  CALCIUM 8.2*   ------------------------------------------------------------------------------------------------------------------  Cardiac Enzymes No results for input(s): TROPONINI in the last 168 hours. ------------------------------------------------------------------------------------------------------------------  RADIOLOGY:  Dg C-arm 1-60 Min  Result Date: 10/18/2018 CLINICAL DATA:  Right leg surgery. EXAM: RIGHT FEMUR 2 VIEWS; DG C-ARM 61-120 MIN COMPARISON:  10/24/2017 FINDINGS: Single fluoroscopic image demonstrates partial visualization of the right knee replacement. The plate and screw fixation in the distal femur has been removed. IMPRESSION: Interval removal of the distal femoral plate and screws. Partial visualization of the knee replacement. Electronically Signed   By: Richarda OverlieAdam  Henn M.D.   On: 10/18/2018  16:57   Dg Femur, Min 2  Views Right  Result Date: 10/18/2018 CLINICAL DATA:  Right leg surgery. EXAM: RIGHT FEMUR 2 VIEWS; DG C-ARM 61-120 MIN COMPARISON:  10/24/2017 FINDINGS: Single fluoroscopic image demonstrates partial visualization of the right knee replacement. The plate and screw fixation in the distal femur has been removed. IMPRESSION: Interval removal of the distal femoral plate and screws. Partial visualization of the knee replacement. Electronically Signed   By: Richarda Overlie M.D.   On: 10/18/2018 16:57     ASSESSMENT AND PLAN:   *COPD with wheezing and acute hypoxic respiratory failure Improved today and wheezing is much improved.  On 1 L oxygen. Continue incentive spirometer. Nebulizers and inhalers  *Diabetes mellitus.  Patient has not been started on his home insulin.  Sliding scale insulin.  Blood sugars are well controlled.  110 and 133 today  *Hypertension.  Continue home medications.  *CKD stage III.  Creatinine stable.   *Postoperative wound infection of distal femur fracture.  Status post irrigation and debridement on 10/18/2018.  On IV antibiotics.  Cultures pending.  Management per orthopedics.  All the records are reviewed and case discussed with Care Management/Social Worker Management plans discussed with the patient, family and they are in agreement.  CODE STATUS: Full code  Patient wanted to be a DO NOT RESUSCITATE and DO NOT INTUBATE yesterday.  Today after discussing with his wife was at bedside they have decided to change CODE STATUS to full code.  Orders entered.  DVT Prophylaxis: SCDs  TOTAL TIME TAKING CARE OF THIS PATIENT: 35 minutes.   POSSIBLE D/C IN 1-2 DAYS, DEPENDING ON CLINICAL CONDITION.  Molinda Bailiff Hyun Reali M.D on 10/19/2018 at 12:35 PM  Between 7am to 6pm - Pager - [phone]  After 6pm go to [email or web address] - password [email or web address] [facility]  [facility] [place]  Office  [phone]  CC: Primary care physician; Marguarite Arbour, MD  Note: This dictation was  prepared with Dragon dictation along with smaller phrase technology. Any transcriptional errors that result from this process are unintentional.

## 2018-10-19 NOTE — Care Management (Signed)
Wound VAC referral to [facility] with [facility]. Rosey Bath with [facility] updated. RNCM spoke with RN to confirm VAC need (not a Prevena/disposable VAC). Per RN, it is an actual VAC.

## 2018-10-19 NOTE — Progress Notes (Signed)
Subjective: 1 Day Post-Op Procedure(s) (LRB): IRRIGATION AND DEBRIDEMENT EXTREMITY, POSSIBLE HARDWARE REMOVAL OF SUPERCONDYLAR PLATE (Right) HARDWARE REMOVAL- DISTAL FEMUR FRACTURE FIXATION (Right) Patient reports pain as moderate.   Patient is well,laying in bed with woundvac in place. PT and Care management to assist with discharge planning. Patient history positive for COPD. Fever: no Gastrointestinal:Negative for nausea and vomiting  Objective: Vital signs in last 24 hours: Temp:  [96.6 F (35.9 C)-98.8 F (37.1 C)] 96.6 F (35.9 C) (01/08 0353) Pulse Rate:  [62-75] 62 (01/08 0353) Resp:  [12-19] 19 (01/08 0353) BP: (113-168)/(47-76) 145/61 (01/08 0353) SpO2:  [88 %-100 %] 96 % (01/08 0353) FiO2 (%):  [28 %] 28 % (01/08 0210)  Intake/Output from previous day:  Intake/Output Summary (Last 24 hours) at 10/19/2018 0745 Last data filed at 10/19/2018 0356 Gross per 24 hour  Intake 2642.02 ml  Output 1150 ml  Net 1492.02 ml    Intake/Output this shift: No intake/output data recorded.  Labs: Recent Labs    10/17/18 1501 10/19/18 0431  HGB 11.1* 9.3*   Recent Labs    10/17/18 1501 10/19/18 0431  WBC 18.9* 16.8*  RBC 4.30 3.65*  HCT 37.1* 31.2*  PLT 224 152   Recent Labs    10/17/18 1501 10/19/18 0431  NA 140 137  K 4.6 4.6  CL 103 105  CO2 30 25  BUN 47* 36*  CREATININE 1.51* 1.42*  GLUCOSE 126* 161*  CALCIUM 8.7* 8.2*   No results for input(s): LABPT, INR in the last 72 hours.   EXAM General - Patient is Alert and Oriented Extremity - ABD soft Sensation intact distally Intact pulses distally Dorsiflexion/Plantar flexion intact Incision: dressing C/D/I No cellulitis present  2+ pitting edema to the right lower extremity. Dressing/Incision - woundvac intact without drainage.  Motor Function - intact, moving foot and toes well on exam.    Past Medical History:  Diagnosis Date  . Depression   . Diabetes mellitus without complication (HCC)   .  Dyspnea   . Elevated lipids   . Femur fracture (HCC) 08/22/2018  . GERD (gastroesophageal reflux disease)   . Hypertension   . Pulmonary emboli (HCC)   . Renal insufficiency    only has 1 kidney  . Sleep apnea   . Stroke (HCC)    Rt side weakness    Assessment/Plan: 1 Day Post-Op Procedure(s) (LRB): IRRIGATION AND DEBRIDEMENT EXTREMITY, POSSIBLE HARDWARE REMOVAL OF SUPERCONDYLAR PLATE (Right) HARDWARE REMOVAL- DISTAL FEMUR FRACTURE FIXATION (Right) Active Problems:   Closed nondisplaced supracondylar fracture of distal end of femur without intracondylar extension (HCC)   Postoperative wound infection   Supracondylar fracture of distal end of femur without intracondylar extension (HCC)  Estimated body mass index is 30.28 kg/m as calculated from the following:   Height as of this encounter: 5\' 9"  (1.753 m).   Weight as of this encounter: 93 kg. Advance diet Up with therapy   Labs reviewed this AM, WBC 16 currently on Vancomycin. Patient is requesting more pain meds but has allergy to Codeine and morphine, states that it "made him stop breathing".  Nursing staff state that he will have pain meds and go to sleep and then forget that he got them.  Will add muscle relaxer to regimen. Knee locked in extension.  NWB to the right leg. Up with therapy today. CBC and BMP ordered for tomorrow morning.  DVT Prophylaxis - Xarelto and Foot Pumps NWB to right leg.  Valeria Batman, PA-C [facility] 10/19/2018, 7:45 AM

## 2018-10-20 LAB — BASIC METABOLIC PANEL
ANION GAP: 6 (ref 5–15)
BUN: 37 mg/dL — ABNORMAL HIGH (ref 8–23)
CO2: 26 mmol/L (ref 22–32)
Calcium: 8 mg/dL — ABNORMAL LOW (ref 8.9–10.3)
Chloride: 105 mmol/L (ref 98–111)
Creatinine, Ser: 1.53 mg/dL — ABNORMAL HIGH (ref 0.61–1.24)
GFR calc Af Amer: 48 mL/min — ABNORMAL LOW (ref >60)
GFR calc non Af Amer: 41 mL/min — ABNORMAL LOW (ref >60)
GLUCOSE: 162 mg/dL — AB (ref 70–99)
Potassium: 4.4 mmol/L (ref 3.5–5.1)
Sodium: 137 mmol/L (ref 135–145)

## 2018-10-20 LAB — CBC
HCT: 27.6 % — ABNORMAL LOW (ref 39.0–52.0)
Hemoglobin: 8.1 g/dL — ABNORMAL LOW (ref 13.0–17.0)
MCH: 25.5 pg — ABNORMAL LOW (ref 26.0–34.0)
MCHC: 29.3 g/dL — ABNORMAL LOW (ref 30.0–36.0)
MCV: 86.8 fL (ref 80.0–100.0)
Platelets: 137 10*3/uL — ABNORMAL LOW (ref 150–400)
RBC: 3.18 MIL/uL — AB (ref 4.22–5.81)
RDW: 17.5 % — ABNORMAL HIGH (ref 11.5–15.5)
WBC: 11.9 10*3/uL — ABNORMAL HIGH (ref 4.0–10.5)
nRBC: 0 % (ref 0.0–0.2)

## 2018-10-20 LAB — GLUCOSE, CAPILLARY
Glucose-Capillary: 83 mg/dL (ref 70–99)
Glucose-Capillary: 109 mg/dL — ABNORMAL HIGH (ref 70–99)
Glucose-Capillary: 102 mg/dL — ABNORMAL HIGH (ref 70–99)
Glucose-Capillary: 125 mg/dL — ABNORMAL HIGH (ref 70–99)

## 2018-10-20 MED ORDER — IPRATROPIUM-ALBUTEROL 0.5-2.5 (3) MG/3ML IN SOLN
3.0000 mL | Freq: Two times a day (BID) | RESPIRATORY_TRACT | Status: DC
  Administered 2018-10-20 – 2018-10-21 (×2): 3 mL via RESPIRATORY_TRACT
  Filled 2018-10-20 (×2): qty 3

## 2018-10-20 MED ORDER — INSULIN ASPART PROT & ASPART (70-30 MIX) 100 UNIT/ML ~~LOC~~ SUSP: 15.0000 [IU] | Freq: Every day | SUBCUTANEOUS | Status: DC

## 2018-10-20 MED ORDER — INSULIN ASPART PROT & ASPART (70-30 MIX) 100 UNIT/ML ~~LOC~~ SUSP
10.0000 [IU] | Freq: Once | SUBCUTANEOUS | Status: AC
  Administered 2018-10-20: 10 [IU] via SUBCUTANEOUS
  Filled 2018-10-20: qty 10

## 2018-10-20 MED ORDER — BISACODYL 5 MG PO TBEC
5.0000 mg | DELAYED_RELEASE_TABLET | Freq: Every day | ORAL | Status: DC | PRN
  Administered 2018-10-20 – 2018-10-21 (×2): 5 mg via ORAL
  Filled 2018-10-20 (×2): qty 1

## 2018-10-20 NOTE — Progress Notes (Signed)
Physical Therapy Treatment Patient Details Name: Gary Thomas MRN: [id number] DOB: [birth year] Today's Date: 10/20/2018    History of Present Illness Pt is 85 yo male s/p ORIF of R upper condylar femur fx, developed infection, presented to ED with SOB and fever; underwent I&D and hardware removal 10/18/2018. PMH of DM, HTN, pulmonary emboli, renal insufficiency (1kidney) stroke (R sided weakness)    PT Comments    Patient agreeable to PT with encouragement, stated he feels "rotten" today, did not get enough rest, and is having a lot of pain, 8/10. Agreeable to try with PT. Able to perform therapeutic exercises on bilateral LE (R knee brace on and locked in extension throughout), AAROM for bilateral SLR. Supine to sit with minAx1, able to sit EOB for several minutes with good balance, and good trunk control without UE and reaching for items. Lateral/scoot transfers to recliner with  maxAx1, modAx1 to laterally transfer to chair, repositioned with minAx2. Pt instructed to participate as fully as possible, of note R foot on ground and pt admitted to placing some weight through that LE. Precautions reviewed. Pt up in chair with all needs in reach.     Follow Up Recommendations  Home health PT;Supervision/Assistance - 24 hour     Equipment Recommendations  None recommended by PT;Other (comment)    Recommendations for Other Services       Precautions / Restrictions Precautions Precautions: Knee;Fall Precaution Booklet Issued: No Required Braces or Orthoses: Knee Immobilizer - Right Knee Immobilizer - Right: On at all times;Other (comment) Restrictions Weight Bearing Restrictions: Yes RLE Weight Bearing: Non weight bearing    Mobility  Bed Mobility Overal bed mobility: Needs Assistance Bed Mobility: Supine to Sit     Supine to sit: Min assist     General bed mobility comments: use of bed rails, HOB elevated  Transfers Overall transfer level: Needs assistance   Transfers:  Lateral/Scoot Transfers          Lateral/Scoot Transfers: Max assist;Mod assist;+2 physical assistance General transfer comment: MaxAx1, modAx1 to laterally transfer to chair, repositioned with minAx2. Pt instructed to participate as fully as possible, of note R foot on ground and pt admitted to placing some weight through that LE. Precautions reviewed.  Ambulation/Gait                 Stairs             Wheelchair Mobility    Modified Rankin (Stroke Patients Only)       Balance Overall balance assessment: Needs assistance Sitting-balance support: Feet supported                                        Cognition Arousal/Alertness: Awake/alert Behavior During Therapy: WFL for tasks assessed/performed Overall Cognitive Status: Within Functional Limits for tasks assessed                                        Exercises General Exercises - Lower Extremity Ankle Circles/Pumps: AAROM;Both;15 reps Quad Sets: AROM;Strengthening;Both;15 reps Gluteal Sets: AROM;Strengthening;Both;15 reps Heel Slides: AROM;Left;15 reps Hip ABduction/ADduction: AROM;Both;15 reps Straight Leg Raises: AAROM;Both;10 reps Other Exercises Other Exercises: Pt able to sit EOB for several minutes with varying degrees of UE support, reaching for items with good trunk control. Able to maintain balance without UE support.  General Comments        Pertinent Vitals/Pain Pain Assessment: 0-10 Pain Score: 7  Pain Location: R knee Pain Descriptors / Indicators: Aching;Burning;Throbbing;Sharp Pain Intervention(s): Limited activity within patient's tolerance;Monitored during session;Repositioned    Home Living                      Prior Function            PT Goals (current goals can now be found in the care plan section) Progress towards PT goals: Progressing toward goals    Frequency    BID      PT Plan Current plan remains appropriate     Co-evaluation              AM-PAC PT "6 Clicks" Mobility   Outcome Measure  Help needed turning from your back to your side while in a flat bed without using bedrails?: A Little Help needed moving from lying on your back to sitting on the side of a flat bed without using bedrails?: A Little Help needed moving to and from a bed to a chair (including a wheelchair)?: A Lot Help needed standing up from a chair using your arms (e.g., wheelchair or bedside chair)?: Total Help needed to walk in hospital room?: Total Help needed climbing 3-5 steps with a railing? : Total 6 Click Score: 11    End of Session Equipment Utilized During Treatment: Gait belt Activity Tolerance: Patient limited by fatigue;Patient limited by pain Patient left: with chair alarm set;in chair;with family/visitor present;with call bell/phone within reach;with SCD's reapplied Nurse Communication: Mobility status PT Visit Diagnosis: Difficulty in walking, not elsewhere classified (R26.2);Other abnormalities of gait and mobility (R26.89);Muscle weakness (generalized) (M62.81);Pain Pain - Right/Left: Right Pain - part of body: Knee     Time: 0347-4259 PT Time Calculation (min) (ACUTE ONLY): 38 min  Charges:  $Therapeutic Exercise: 8-22 mins $Therapeutic Activity: 23-37 mins                     Olga Coaster PT, DPT 11:27 AM,10/20/18 [id number]

## 2018-10-20 NOTE — Progress Notes (Signed)
Patient ID: Gary Thomas, male   DOB: 03/16/1932, 85 y.o.   MRN: [id number]  [employer] PROGRESS NOTE  Gary DukeDanford R Maranto GUY:[id number] DOB: 03/16/1932 DOA: 10/17/2018 PCP: Marguarite ArbourSparks, [name], MD  HPI/Subjective: Patient feeling okay.  Offers no complaints.  States he is tired of not being able to walk.  Objective: Vitals:   10/20/18 1326 10/20/18 1510  BP:  (!) 123/49  Pulse:  73  Resp:    Temp:  97.8 F (36.6 C)  SpO2: 97% 93%    Filed Weights   10/17/18 1159  Weight: 93 kg    ROS: Review of Systems  Constitutional: Negative for chills and fever.  Eyes: Negative for blurred vision.  Respiratory: Negative for cough and shortness of breath.   Cardiovascular: Negative for chest pain.  Gastrointestinal: Positive for constipation. Negative for abdominal pain, diarrhea, nausea and vomiting.  Genitourinary: Negative for dysuria.  Musculoskeletal: Negative for joint pain.  Neurological: Negative for dizziness and headaches.   Exam: Physical Exam  HENT:  Nose: No mucosal edema.  Mouth/Throat: No oropharyngeal exudate or posterior oropharyngeal edema.  Eyes: Pupils are equal, round, and reactive to light. Conjunctivae, EOM and lids are normal.  Neck: No JVD present. Carotid bruit is not present. No edema present. No thyroid mass and no thyromegaly present.  Cardiovascular: S1 normal and S2 normal. Exam reveals no gallop.  No murmur heard. Pulses:      Dorsalis pedis pulses are 2+ on the right side and 2+ on the left side.  Respiratory: No respiratory distress. He has no wheezes. He has no rhonchi. He has no rales.  GI: Soft. Bowel sounds are normal. There is no abdominal tenderness.  Musculoskeletal:     Right shoulder: He exhibits no swelling.  Lymphadenopathy:    He has no cervical adenopathy.  Neurological: He is alert. No cranial nerve deficit.  Skin: Skin is warm. No rash noted. Nails show no clubbing.  Psychiatric: He has a normal mood and affect.       Data Reviewed: Basic Metabolic Panel: Recent Labs  Lab 10/17/18 1501 10/19/18 0431 10/20/18 0325  NA 140 137 137  K 4.6 4.6 4.4  CL 103 105 105  CO2 30 25 26   GLUCOSE 126* 161* 162*  BUN 47* 36* 37*  CREATININE 1.51* 1.42* 1.53*  CALCIUM 8.7* 8.2* 8.0*   CBC: Recent Labs  Lab 10/17/18 1501 10/19/18 0431 10/20/18 0325  WBC 18.9* 16.8* 11.9*  NEUTROABS 14.8* 15.2*  --   HGB 11.1* 9.3* 8.1*  HCT 37.1* 31.2* 27.6*  MCV 86.3 85.5 86.8  PLT 224 152 137*   BNP (last 3 results) Recent Labs    08/28/18 0944  BNP 169.0*    CBG: Recent Labs  Lab 10/19/18 1137 10/19/18 1653 10/19/18 2127 10/20/18 0829 10/20/18 1229  GLUCAP 133* 102* 153* 83 109*    Recent Results (from the past 240 hour(s))  Surgical PCR screen     Status: None   Collection Time: 10/17/18 10:52 AM  Result Value Ref Range Status   MRSA, PCR NEGATIVE NEGATIVE Final   Staphylococcus aureus NEGATIVE NEGATIVE Final    Comment: (NOTE) The Xpert SA Assay (FDA approved for NASAL specimens in patients 83 years of age and older), is one component of a comprehensive surveillance program. It is not intended to diagnose infection nor to guide or monitor treatment. Performed at [facility], [address] [place], [place] [address]   Aerobic/Anaerobic Culture (surgical/deep wound)  Status: None (Preliminary result)   Collection Time: 10/18/18  3:44 PM  Result Value Ref Range Status   Specimen Description   Final    WOUND Performed at [facility], [address] [place], [place] [address]    Special Requests   Final    RT THIGH Performed at [facility], [address] [place], [place] [address]    Gram Stain   Final    ABUNDANT WBC PRESENT,BOTH PMN AND MONONUCLEAR RARE GRAM POSITIVE COCCI    Culture   Final    FEW STAPHYLOCOCCUS AUREUS SUSCEPTIBILITIES TO FOLLOW Performed at [facility], [address] [place], [place] [address]    Report  Status PENDING  Incomplete     Studies: Dg C-arm 1-60 Min  Result Date: 10/18/2018 CLINICAL DATA:  Right leg surgery. EXAM: RIGHT FEMUR 2 VIEWS; DG C-ARM 61-120 MIN COMPARISON:  10/24/2017 FINDINGS: Single fluoroscopic image demonstrates partial visualization of the right knee replacement. The plate and screw fixation in the distal femur has been removed. IMPRESSION: Interval removal of the distal femoral plate and screws. Partial visualization of the knee replacement. Electronically Signed   By: Richarda OverlieAdam  Henn M.D.   On: 10/18/2018 16:57   Dg Femur, Min 2 Views Right  Result Date: 10/18/2018 CLINICAL DATA:  Right leg surgery. EXAM: RIGHT FEMUR 2 VIEWS; DG C-ARM 61-120 MIN COMPARISON:  10/24/2017 FINDINGS: Single fluoroscopic image demonstrates partial visualization of the right knee replacement. The plate and screw fixation in the distal femur has been removed. IMPRESSION: Interval removal of the distal femoral plate and screws. Partial visualization of the knee replacement. Electronically Signed   By: Richarda OverlieAdam  Henn M.D.   On: 10/18/2018 16:57    Scheduled Meds: . allopurinol  50 mg Oral Q48H  . atorvastatin  20 mg Oral QHS  . budesonide  0.5 mg Nebulization BID  . cholecalciferol  4,000 Units Oral Daily  . docusate sodium  100 mg Oral BID  . ferrous sulfate  325 mg Oral TID WC  . furosemide  40 mg Oral QHS  . insulin aspart  0-15 Units Subcutaneous TID WC  . insulin aspart  0-5 Units Subcutaneous QHS  . [START ON 10/21/2018] insulin aspart protamine- aspart  15 Units Subcutaneous Q breakfast  . ipratropium-albuterol  3 mL Nebulization Q6H  . losartan  25 mg Oral QHS  . metoprolol succinate  100 mg Oral QHS  . mirtazapine  15 mg Oral QHS  . multivitamin-lutein  1 capsule Oral Daily  . pantoprazole  40 mg Oral Daily  . Rivaroxaban  15 mg Oral QAC supper  . sertraline  100 mg Oral QHS  . tiotropium  18 mcg Inhalation Daily   Continuous Infusions: . vancomycin 750 mg (10/20/18 1617)     Assessment/Plan:  1. Mild COPD exacerbation.  Improved with nebulizer treatments.  Off oxygen now. 2. Type 2 diabetes mellitus.  Decrease the NPH insulin down to 10 units twice daily.  Sliding scale for now. 3. Hypertension on losartan and metoprolol 4. Hyperlipidemia unspecified on atorvastatin 5. History of gout on allopurinol 6. Infection of hardware status post removal.  IV vancomycin for now.  Still waiting a full cultures. 7. History of pulmonary emboli on Xarelto 8. Depression on Zoloft 9. Constipation.  Give Dulcolax tablet 10. Nurse concerned about the patient's swallowing I will get a speech evaluation  Code Status:     Code Status Orders  (From admission, onward)  Start     Ordered   10/19/18 1056  Full code  Continuous     10/19/18 1055        Code Status History    Date Active Date Inactive Code Status Order ID Comments User Context   10/18/2018 2058 10/19/2018 1055 DNR [id number]  Milagros Loll, MD Inpatient   10/18/2018 1925 10/18/2018 2057 Full Code [id number]  Christena Flake, MD Inpatient   10/17/2018 1423 10/18/2018 1826 Full Code [id number]  Christena Flake, MD Inpatient   10/06/2018 1640 10/10/2018 1849 Full Code [id number]  Katha Hamming, MD ED   08/28/2018 1334 08/30/2018 1711 Full Code [id number]  Houston Siren, MD Inpatient   08/23/2018 0125 08/26/2018 2201 Full Code [id number]  Cammy Copa, MD Inpatient   02/15/2018 2012 02/21/2018 1700 Full Code [id number]  Maeola Harman, MD Inpatient   03/02/2017 1938 03/05/2017 1439 Full Code [id number]  Kennedy Bucker, MD Inpatient     Disposition Plan: As per orthopedics  Antibiotics:  Vancomycin  Time spent: 27 minutes  Annasophia Crocker [employer]

## 2018-10-20 NOTE — Progress Notes (Signed)
Subjective: 2 Days Post-Op Procedure(s) (LRB): IRRIGATION AND DEBRIDEMENT EXTREMITY, POSSIBLE HARDWARE REMOVAL OF SUPERCONDYLAR PLATE (Right) HARDWARE REMOVAL- DISTAL FEMUR FRACTURE FIXATION (Right) Patient reports pain as moderate. Patient is well,laying in bed with woundvac in place. Current plan is for discharge home with HHPT. Patient history positive for COPD. Fever: no Gastrointestinal:Negative for nausea and vomiting  Objective: Vital signs in last 24 hours: Temp:  [97.5 F (36.4 C)-97.9 F (36.6 C)] 97.9 F (36.6 C) (01/09 0113) Pulse Rate:  [59-80] 59 (01/09 0826) Resp:  [14] 14 (01/09 0113) BP: (100-133)/(47-57) 132/47 (01/09 0826) SpO2:  [94 %-100 %] 97 % (01/09 0826)  Intake/Output from previous day:  Intake/Output Summary (Last 24 hours) at 10/20/2018 1052 Last data filed at 10/20/2018 0455 Gross per 24 hour  Intake 444.33 ml  Output 1500 ml  Net -1055.67 ml    Intake/Output this shift: No intake/output data recorded.  Labs: Recent Labs    10/17/18 1501 10/19/18 0431 10/20/18 0325  HGB 11.1* 9.3* 8.1*   Recent Labs    10/19/18 0431 10/20/18 0325  WBC 16.8* 11.9*  RBC 3.65* 3.18*  HCT 31.2* 27.6*  PLT 152 137*   Recent Labs    10/19/18 0431 10/20/18 0325  NA 137 137  K 4.6 4.4  CL 105 105  CO2 25 26  BUN 36* 37*  CREATININE 1.42* 1.53*  GLUCOSE 161* 162*  CALCIUM 8.2* 8.0*   No results for input(s): LABPT, INR in the last 72 hours.   EXAM General - Patient is Alert and Oriented Extremity - ABD soft Sensation intact distally Intact pulses distally Dorsiflexion/Plantar flexion intact Incision: dressing C/D/I No cellulitis present  2+ pitting edema to the right lower extremity. Dressing/Incision - woundvac intact with mild bloody drainage, no purulent discharge.  Motor Function - intact, moving foot and toes well on exam.   Past Medical History:  Diagnosis Date  . Depression   . Diabetes mellitus without complication (HCC)   .  Dyspnea   . Elevated lipids   . Femur fracture (HCC) 08/22/2018  . GERD (gastroesophageal reflux disease)   . Hypertension   . Pulmonary emboli (HCC)   . Renal insufficiency    only has 1 kidney  . Sleep apnea   . Stroke (HCC)    Rt side weakness    Assessment/Plan: 2 Days Post-Op Procedure(s) (LRB): IRRIGATION AND DEBRIDEMENT EXTREMITY, POSSIBLE HARDWARE REMOVAL OF SUPERCONDYLAR PLATE (Right) HARDWARE REMOVAL- DISTAL FEMUR FRACTURE FIXATION (Right) Active Problems:   Closed nondisplaced supracondylar fracture of distal end of femur without intracondylar extension (HCC)   Postoperative wound infection   Supracondylar fracture of distal end of femur without intracondylar extension (HCC)  Estimated body mass index is 30.28 kg/m as calculated from the following:   Height as of this encounter: 5\' 9"  (1.753 m).   Weight as of this encounter: 93 kg. Advance diet Up with therapy   Labs reviewed this AM, WBC 11.9 currently on Vancomycin.  Waiting on Cultures prior to switching to PO antibiotics. Reports continued pain in the right leg, did add flexeril yesterday. NWB to the right leg but can work on gentle flexion and extension. Mild bloody drainage to the right leg woundvac.  Will switch suction over to Prevena wound vac prior to discharge home tomorrow. Up with therapy today, current plan is for discharge home with HHPT. CBC and BMP ordered for tomorrow morning.  DVT Prophylaxis - Xarelto and Foot Pumps NWB to right leg.  Valeria Batman, PA-C [facility]  Orthopaedic Surgery 10/20/2018, 10:52 AM

## 2018-10-20 NOTE — Progress Notes (Signed)
Blood pressure 123/52. Notified Dr Anne HahnWillis. Received order to hold metoprolol 100 mg po

## 2018-10-20 NOTE — Care Management (Signed)
Notified Barbara Cower with [facility] that patient will DC on Disposable wound Vac

## 2018-10-20 NOTE — Evaluation (Addendum)
Clinical/Bedside Swallow Evaluation Patient Details  Name: Gary Thomas MRN: [id number] Date of Birth: Feb 24, 1932  Today's Date: 10/20/2018 Time: SLP Start Time (ACUTE ONLY): 1410 SLP Stop Time (ACUTE ONLY): 1510 SLP Time Calculation (min) (ACUTE ONLY): 60 min  Past Medical History:  Past Medical History:  Diagnosis Date  . Depression   . Diabetes mellitus without complication (HCC)   . Dyspnea   . Elevated lipids   . Femur fracture (HCC) 08/22/2018  . GERD (gastroesophageal reflux disease)   . Hypertension   . Pulmonary emboli (HCC)   . Renal insufficiency    only has 1 kidney  . Sleep apnea   . Stroke (HCC)    Rt side weakness   Past Surgical History:  Past Surgical History:  Procedure Laterality Date  . 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 AORTA SURGERY    . CAROTID ARTERY - SUBCLAVIAN ARTERY BYPASS GRAFT Bilateral   . CATARACT EXTRACTION W/ INTRAOCULAR LENS  IMPLANT, BILATERAL    . CHOLECYSTECTOMY    . EYE SURGERY    . FEMUR FRACTURE SURGERY Right   . HIP FRACTURE SURGERY Bilateral   . I&D EXTREMITY Right 10/18/2018   Procedure: IRRIGATION AND DEBRIDEMENT EXTREMITY, POSSIBLE HARDWARE REMOVAL OF SUPERCONDYLAR PLATE;  Surgeon: Christena Flake, MD;  Location: [facility] ORS;  Service: Orthopedics;  Laterality: Right;  . JOINT REPLACEMENT    . ORIF FEMUR FRACTURE Right 08/24/2018   Procedure: OPEN REDUCTION INTERNAL FIXATION (ORIF) SUPRACONDYLAR FEMUR FRACTURE;  Surgeon: Christena Flake, MD;  Location: [facility] ORS;  Service: Orthopedics;  Laterality: Right;  . ORIF FEMUR FRACTURE Right 10/18/2018   Procedure: HARDWARE REMOVAL- DISTAL FEMUR FRACTURE FIXATION;  Surgeon: Christena Flake, MD;  Location: [facility] ORS;  Service: Orthopedics;  Laterality: Right;  . PROSTATE SURGERY    . REPLACEMENT TOTAL KNEE Right   . TOTAL HIP ARTHROPLASTY Right 03/02/2017   Procedure: TOTAL HIP ARTHROPLASTY ANTERIOR APPROACH;  Surgeon: Kennedy Bucker, MD;  Location: [facility]  ORS;  Service: Orthopedics;  Laterality: Right;  Marland Kitchen VASCULAR SURGERY     HPI:  Pt is an 85 year old male with PMHx of Removal of Soft Palate d/t OSA(per pt), pulmonary emboli, HTN, depression, DM, GERD, renal insufficiency (has one kidney), hx CVA, sleep apnea who is admitted with deep wound infection s/p ORIF of right supracondylar femur fracture on 08/24/2018 now s/p I&D with hardware removal and placement of wound VAC on 1/7.  Per Dietician note, pt has had decreased appetite and po intake; reports that over the past year or so he is eating less than usual at meals. He has had multiple surgeries over the past year or so and reports his appetite has never returned to what it was.  NSG reported difficulty swallowing and an episode of aspiration when trying to swallow pills(?) while eating Lunch meal today. Pt endorsed he "has to be careful" when swallowing food or pills to "avoid allowing them to get to a certain area in the mouth/throat (where the soft palate was removed) which will trigger gagging and/or choking. Wife has endorsed this is how pt has to eat at home as well or he will have choking episodes.  Noted pt's Pulmonary presentation per CXR history in chart.    Assessment / Plan / Recommendation Clinical Impression  Pt appears to present w/ a fairly adquate oropharyngeal phase swallow function w/ po trials at his evaluation today. However, pt has had his Soft Palate removed  w/ residual impact on the oropharyngeal swallow if he is not attending to how he eats/drinks. Pt reported this has been ongoing since the surgery ~10 years ago. Also currently, pt is impacted by discomfort when sitting fully upright; he required much encouragement and support w/ pillows and towel roll behind head to sit more upright w/ head forward for po intake. (Pt even stated that SLP's positioning was "higher up than a I usually sit up.) Pt exhibited no overt coughing during/post trials of thin liquids via Cup, Straw and  purees. No decline in vocal quality or respiratory status during/post trials. During the oral phase, pt exhibited adequate bolus control w/ trials of liquids and puree assessed; timely bolus management and oral clearing w/ trials. Pt declined further trials of solids stating he "did not want anything else".  OM exam revealed no unilateral lingual/labial weakness. Pt was able to feed self w/ setup but required Moderate verbal cues and recommendations to Sit upright more, take small bites/sips slowly.  Recommend a Dysphagia level 3 (moistened foods, meats cut) diet w/ thin liquids; aspiration precautions; Pills in puree for safer swallowing; monitoring to assure an upright position w/ head forward during any po intake. Discussed aspiration precautions w/ pt; dysphagia and its impact on the Pulmonary status. Pt does appear at his baseline w/ re: to swallowing in light of Soft Palage removal - as pt stated, he must use small bites and clear mouth well b/t bites. He also endorsed need to slow down when eating/drinking as recommended by SLP. Educated pt on the absolute need to sit upright w/ head forward for any oral intake for safety; pt agreed. NSG to reconsult ST services if any further education needs while admitted.  SLP Visit Diagnosis: Dysphagia, oropharyngeal phase (R13.12)    Aspiration Risk  Mild aspiration risk(but reduced w/ general aspiration precautions)    Diet Recommendation     Medication Administration: Whole meds with puree(for safer swallowing)    Other  Recommendations Recommended Consults: (Dietician f/u) Oral Care Recommendations: Oral care BID;Patient independent with oral care;Staff/trained caregiver to provide oral care Other Recommendations: (n/a)   Follow up Recommendations None      Frequency and Duration (n/a)  (n/a)       Prognosis Prognosis for Safe Diet Advancement: Fair(-Good)      Swallow Study   General Date of Onset: 10/17/18 HPI: Pt is an 85 year old male  with PMHx of Removal of Soft Palate d/t OSA(per pt), pulmonary emboli, HTN, depression, DM, GERD, renal insufficiency (has one kidney), hx CVA, sleep apnea who is admitted with deep wound infection s/p ORIF of right supracondylar femur fracture on 08/24/2018 now s/p I&D with hardware removal and placement of wound VAC on 1/7.  Per Dietician note, pt has had decreased appetite and po intake; reports that over the past year or so he is eating less than usual at meals. He has had multiple surgeries over the past year or so and reports his appetite has never returned to what it was.  NSG reported difficulty swallowing and an episode of aspiration when trying to swallow pills(?) while eating Lunch meal today. Pt endorsed he "has to be careful" when swallowing food or pills to "avoid allowing them to get to a certain area in the mouth/throat (where the soft palate was removed) which will trigger gagging and/or choking. Wife has endorsed this is how pt has to eat at home as well or he will have choking episodes.  Noted pt's Pulmonary  presentation per CXR history in chart.  Type of Study: Bedside Swallow Evaluation Previous Swallow Assessment: none reported Diet Prior to this Study: Regular;Thin liquids Temperature Spikes Noted: No(wbc 11.9) Respiratory Status: Nasal cannula(2 liters) History of Recent Intubation: No Behavior/Cognition: Alert;Cooperative;Pleasant mood Oral Cavity Assessment: (soft palate removed ~10 years ago d/t OSA per pt) Oral Care Completed by SLP: Recent completion by staff Oral Cavity - Dentition: Dentures, top;Missing dentition(few front lower teeth ) Vision: Functional for self-feeding Self-Feeding Abilities: Able to feed self;Needs set up Patient Positioning: Upright in bed(needed upright positioning in bed; support w/ pillows) Baseline Vocal Quality: Normal Volitional Cough: Strong Volitional Swallow: Able to elicit    Oral/Motor/Sensory Function Overall Oral Motor/Sensory  Function: Within functional limits   Ice Chips Ice chips: Within functional limits Presentation: Spoon(fed; 1 trial)   Thin Liquid Thin Liquid: Within functional limits Presentation: Cup;Self Fed;Straw(~3 ozs total)    Nectar Thick Nectar Thick Liquid: Not tested   Honey Thick Honey Thick Liquid: Not tested   Puree Puree: Within functional limits Presentation: Spoon;Self Fed(5 trials)   Solid     Solid: Not tested Other Comments: declined trials offered; no other moist solids available       Jerilynn SomKatherine Watson, MS, CCC-SLP Watson,Katherine 10/20/2018,4:26 PM

## 2018-10-20 NOTE — Care Management Important Message (Signed)
Important Message  Patient Details  Name: Abran DukeDanford R Schlender MRN: [id number] Date of Birth: March 29, 1932   Medicare Important Message Given:  Yes    Olegario MessierKathy A Keiona Jenison 10/20/2018, 11:50 AM

## 2018-10-20 NOTE — Care Management (Signed)
RNCM spoke with Micah Noel PA and Surgeon plans to transition patient to disposable VAC at discharge. Home VAC order cancelled with [facility].

## 2018-10-20 NOTE — Progress Notes (Signed)
Physical Therapy Treatment Patient Details Name: Gary Thomas R Nall MRN: [id number] DOB: [birth year] Today's Date: 10/20/2018    History of Present Illness Pt is 85 yo male s/p ORIF of R upper condylar femur fx, developed infection, presented to ED with SOB and fever; underwent I&D and hardware removal 10/18/2018. PMH of DM, HTN, pulmonary emboli, renal insufficiency (1kidney) stroke (R sided weakness)    PT Comments    Patient eager for return to bed, voicing significant fatigue this PM, as well as spell of nausea/vomiting over lunch.  Returned to bed via scoot pivot, level surfaces, mod assist +2 for lift off, lateral movement between surfaces.  Constant cuing/assist for forward trunk lean/weight shift and overall trunk control.  Declined further therex/theract due to fatigue/nausea.  Will continue efforts next date.  If patient continues to require +2 for basic transfers, may consider use of hoyer lift for home transfers upon discharge.    Follow Up Recommendations  Home health PT;Supervision/Assistance - 24 hour     Equipment Recommendations       Recommendations for Other Services       Precautions / Restrictions Precautions Precautions: Knee;Fall Required Braces or Orthoses: Knee Immobilizer - Right Restrictions Weight Bearing Restrictions: Yes RLE Weight Bearing: Non weight bearing    Mobility  Bed Mobility Overal bed mobility: Needs Assistance Bed Mobility: Sit to Supine Rolling: Min assist     Sit to supine: Max assist   General bed mobility comments: total assist for LE management  Transfers Overall transfer level: Needs assistance Equipment used: Sliding board Transfers: Lateral/Scoot Transfers          Lateral/Scoot Transfers: Mod assist;+2 physical assistance General transfer comment: constant assist for forward trunk lean/weight shift and overall trunk control; patient able to initiate lift/scooting, but requires assist to fully  complete  Ambulation/Gait             General Gait Details: unsafe/unable   Stairs             Wheelchair Mobility    Modified Rankin (Stroke Patients Only)       Balance Overall balance assessment: Needs assistance Sitting-balance support: No upper extremity supported;Feet supported Sitting balance-Leahy Scale: Fair                                      Cognition Arousal/Alertness: Awake/alert Behavior During Therapy: WFL for tasks assessed/performed Overall Cognitive Status: Within Functional Limits for tasks assessed                                        Exercises Other Exercises Other Exercises: Patient declined participation with therex/theract, voicing that he was "worn out" and "just not up to it"    General Comments        Pertinent Vitals/Pain Pain Assessment: Faces Faces Pain Scale: Hurts whole lot Pain Location: R knee Pain Descriptors / Indicators: Aching;Grimacing;Guarding Pain Intervention(s): Limited activity within patient's tolerance;Monitored during session;Repositioned    Home Living                      Prior Function            PT Goals (current goals can now be found in the care plan section) Acute Rehab PT Goals Patient Stated Goal: to decrease pain PT Goal Formulation:  With patient Time For Goal Achievement: 11/02/18 Potential to Achieve Goals: Good Progress towards PT goals: Not progressing toward goals - comment(limited participation this PM due to pain, nausea)    Frequency    BID      PT Plan Current plan remains appropriate    Co-evaluation              AM-PAC PT "6 Clicks" Mobility   Outcome Measure  Help needed turning from your back to your side while in a flat bed without using bedrails?: A Little Help needed moving from lying on your back to sitting on the side of a flat bed without using bedrails?: A Little Help needed moving to and from a bed to a  chair (including a wheelchair)?: A Lot Help needed standing up from a chair using your arms (e.g., wheelchair or bedside chair)?: Total Help needed to walk in hospital room?: Total Help needed climbing 3-5 steps with a railing? : Total 6 Click Score: 11    End of Session Equipment Utilized During Treatment: Gait belt Activity Tolerance: Patient limited by fatigue;Patient limited by pain Patient left: in bed;with bed alarm set;with call bell/phone within reach Nurse Communication: Mobility status PT Visit Diagnosis: Difficulty in walking, not elsewhere classified (R26.2);Other abnormalities of gait and mobility (R26.89);Muscle weakness (generalized) (M62.81);Pain Pain - Right/Left: Right Pain - part of body: Knee     Time: 1352-1403 PT Time Calculation (min) (ACUTE ONLY): 11 min  Charges:  $Therapeutic Activity: 8-22 mins                     Joeline Freer H. Manson PasseyBrown, PT, DPT, NCS 10/20/18, 3:31 PM [phone]

## 2018-10-21 LAB — CBC
HCT: 31.5 % — ABNORMAL LOW (ref 39.0–52.0)
Hemoglobin: 9.2 g/dL — ABNORMAL LOW (ref 13.0–17.0)
MCH: 25.3 pg — AB (ref 26.0–34.0)
MCHC: 29.2 g/dL — ABNORMAL LOW (ref 30.0–36.0)
MCV: 86.8 fL (ref 80.0–100.0)
Platelets: 156 10*3/uL (ref 150–400)
RBC: 3.63 MIL/uL — ABNORMAL LOW (ref 4.22–5.81)
RDW: 17.7 % — ABNORMAL HIGH (ref 11.5–15.5)
WBC: 8.9 10*3/uL (ref 4.0–10.5)
nRBC: 0 % (ref 0.0–0.2)

## 2018-10-21 LAB — BASIC METABOLIC PANEL
Anion gap: 5 (ref 5–15)
BUN: 29 mg/dL — ABNORMAL HIGH (ref 8–23)
CO2: 31 mmol/L (ref 22–32)
CREATININE: 1.49 mg/dL — AB (ref 0.61–1.24)
Calcium: 8.4 mg/dL — ABNORMAL LOW (ref 8.9–10.3)
Chloride: 104 mmol/L (ref 98–111)
GFR calc Af Amer: 49 mL/min — ABNORMAL LOW (ref >60)
GFR calc non Af Amer: 42 mL/min — ABNORMAL LOW (ref >60)
Glucose, Bld: 104 mg/dL — ABNORMAL HIGH (ref 70–99)
Potassium: 4.5 mmol/L (ref 3.5–5.1)
Sodium: 140 mmol/L (ref 135–145)

## 2018-10-21 LAB — GLUCOSE, CAPILLARY
Glucose-Capillary: 113 mg/dL — ABNORMAL HIGH (ref 70–99)
Glucose-Capillary: 85 mg/dL (ref 70–99)

## 2018-10-21 MED ORDER — HYDROCODONE-ACETAMINOPHEN 5-325 MG PO TABS: 1.0000 | ORAL_TABLET | Freq: Four times a day (QID) | ORAL | 0 refills | Status: DC | PRN

## 2018-10-21 MED ORDER — BISACODYL 10 MG RE SUPP
10.0000 mg | Freq: Once | RECTAL | Status: AC
  Administered 2018-10-21: 10 mg via RECTAL
  Filled 2018-10-21: qty 1

## 2018-10-21 NOTE — Discharge Summary (Signed)
Physician Discharge Summary  Patient ID: Gary Thomas MRN: [id number] DOB/AGE: [birth year] 85 y.o.  Admit date: 10/17/2018 Discharge date: 10/21/2018  Admission Diagnoses:  INFECTION RIGHT DISTAL FEMUR AROUND HARDWARE  Discharge Diagnoses: Patient Active Problem List   Diagnosis Date Noted  . Postoperative wound infection 10/17/2018  . Supracondylar fracture of distal end of femur without intracondylar extension (HCC) 10/17/2018  . Sepsis (HCC) 10/06/2018  . Chronic pulmonary embolism (HCC) 09/06/2018  . Primary osteoarthritis of right knee 09/06/2018  . Status post right knee replacement 09/06/2018  . Dyslipidemia associated with type 2 diabetes mellitus (HCC) 09/06/2018  . CKD stage 3 due to type 2 diabetes mellitus (HCC) 09/06/2018  . Controlled type 2 diabetes mellitus with stage 3 chronic kidney disease, with long-term current use of insulin (HCC) 09/06/2018  . Chronic cerebrovascular accident (CVA) 09/06/2018  . GERD without esophagitis 09/06/2018  . Closed nondisplaced supracondylar fracture of distal end of femur without intracondylar extension (HCC) 09/06/2018  . Acute on chronic anemia 09/06/2018  . Depression, major, single episode, in partial remission (HCC) 09/06/2018  . Bilateral lower extremity edema 09/06/2018  . Chronic gout due to renal impairment 09/06/2018  . COPD with respiratory failure, acute (HCC) 08/28/2018  . Spondylolisthesis of lumbar region 02/15/2018  . PAD (peripheral artery disease) (HCC) 08/19/2017  . Hyperlipidemia 07/05/2017  . Essential hypertension 07/05/2017  . Bilateral carotid artery stenosis 07/05/2017  . Primary localized osteoarthritis of right hip 03/02/2017    Past Medical History:  Diagnosis Date  . Depression   . Diabetes mellitus without complication (HCC)   . Dyspnea   . Elevated lipids   . Femur fracture (HCC) 08/22/2018  . GERD (gastroesophageal reflux disease)   . Hypertension   . Pulmonary emboli (HCC)   . Renal  insufficiency    only has 1 kidney  . Sleep apnea   . Stroke (HCC)    Rt side weakness     Transfusion: None.   Consultants (if any): Treatment Team:  Alford Highland, MD  Discharged Condition: Improved  Hospital Course: Gary Thomas is an 85 y.o. male who was admitted 10/17/2018 with a diagnosis of a deep wound infection status post ORIF of right supracondylar femur fracture and went to the operating room on 10/18/2018 and underwent the above named procedures.    Surgeries: Procedure(s): IRRIGATION AND DEBRIDEMENT EXTREMITY, POSSIBLE HARDWARE REMOVAL OF SUPERCONDYLAR PLATE HARDWARE REMOVAL- DISTAL FEMUR FRACTURE FIXATION on 10/18/2018 Patient tolerated the surgery well. Taken to PACU where she was stabilized and then transferred to the orthopedic floor.  Continued on Xarelto 15mg  upon entering the hospital. Foot pumps applied bilaterally at 80 mm. Heels elevated on bed with rolled towels. No evidence of DVT. Negative Homan. Physical therapy started on day #1 for gait training and transfer. OT started day #1 for ADL and assisted devices.  Patient's IV was removed on 10/21/18 and catheter was removed on 10/21/18  Implants: None, implant removal occurred.  He was given perioperative antibiotics:  Anti-infectives (From admission, onward)   Start     Dose/Rate Route Frequency Ordered Stop   10/19/18 1600  vancomycin (VANCOCIN) 1,250 mg in sodium chloride 0.9 % 250 mL IVPB  Status:  Discontinued     1,250 mg 166.7 mL/hr over 90 Minutes Intravenous Every 24 hours 10/18/18 1935 10/19/18 0742   10/19/18 1600  vancomycin (VANCOCIN) IVPB 750 mg/150 ml premix     750 mg 150 mL/hr over 60 Minutes Intravenous Every 24 hours 10/19/18 0742  10/18/18 2030  vancomycin (VANCOCIN) IVPB 1000 mg/200 mL premix     1,000 mg 200 mL/hr over 60 Minutes Intravenous  Once 10/18/18 1935 10/18/18 2309   10/18/18 1930  vancomycin (VANCOCIN) IVPB 1000 mg/200 mL premix  Status:  Discontinued     1,000  mg 200 mL/hr over 60 Minutes Intravenous Every 12 hours 10/18/18 1925 10/18/18 1935   10/18/18 1544  50,000 units bacitracin in 0.9% normal saline 250 mL irrigation  Status:  Discontinued       As needed 10/18/18 1544 10/18/18 1723   10/18/18 1500  ceFAZolin (ANCEF) IVPB 2g/100 mL premix  Status:  Discontinued     2 g 200 mL/hr over 30 Minutes Intravenous  Once 10/17/18 1302 10/18/18 0941   10/18/18 1200  vancomycin (VANCOCIN) IVPB 1000 mg/200 mL premix     1,000 mg 200 mL/hr over 60 Minutes Intravenous  Once 10/18/18 0941 10/18/18 1527    .  He was given sequential compression devices, early ambulation, and Xarelto for DVT prophylaxis.  He benefited maximally from the hospital stay and there were no complications.    Recent vital signs:  Vitals:   10/21/18 0735 10/21/18 0824  BP:  134/61  Pulse:  84  Resp:    Temp:  97.8 F (36.6 C)  SpO2: 91% 94%   Recent laboratory studies:  Lab Results  Component Value Date   HGB 9.2 (L) 10/21/2018   HGB 8.1 (L) 10/20/2018   HGB 9.3 (L) 10/19/2018   Lab Results  Component Value Date   WBC 8.9 10/21/2018   PLT 156 10/21/2018   Lab Results  Component Value Date   INR 1.06 02/15/2018   Lab Results  Component Value Date   NA 140 10/21/2018   K 4.5 10/21/2018   CL 104 10/21/2018   CO2 31 10/21/2018   BUN 29 (H) 10/21/2018   CREATININE 1.49 (H) 10/21/2018   GLUCOSE 104 (H) 10/21/2018   Discharge Medications:   Allergies as of 10/21/2018      Reactions   Codeine Anaphylaxis   Morphine Anaphylaxis   Gabapentin Other (See Comments)   Unsure of what the reaction was   Stadol [butorphanol]    Unknown   Lyrica [pregabalin] Rash   Tapentadol Nausea Only   ONSET 01/04/2013   Tramadol    Hallucinations "I see giant spiders on ceiling". Pt also believes he is in [facility], but he is in [place].      Medication List    TAKE these medications   acetaminophen 325 MG tablet Commonly known as:  TYLENOL Take 2 tablets (650 mg total)  by mouth every 6 (six) hours as needed for mild pain (or Fever >/= 101).   albuterol (2.5 MG/3ML) 0.083% nebulizer solution Commonly known as:  PROVENTIL Take 3 mLs (2.5 mg total) by nebulization every 4 (four) hours as needed for wheezing or shortness of breath.   allopurinol 100 MG tablet Commonly known as:  ZYLOPRIM Take 0.5 tablets (50 mg total) by mouth every other day.   atorvastatin 20 MG tablet Commonly known as:  LIPITOR Take 1 tablet (20 mg total) by mouth at bedtime.   budesonide 0.5 MG/2ML nebulizer solution Commonly known as:  PULMICORT Take 2 mLs (0.5 mg total) by nebulization 2 (two) times daily.   Cholecalciferol 100 MCG (4000 UT) Caps Take 1 capsule by mouth daily.   DERMACLOUD EX Apply liberal amount topically as needed to area of skin irritation.  Ok to leave at bedside  ferrous sulfate 325 (65 FE) MG EC tablet Take 1 tablet (325 mg total) by mouth 3 (three) times daily with meals.   furosemide 40 MG tablet Commonly known as:  LASIX Take 1 tablet (40 mg total) by mouth at bedtime.   HYDROcodone-acetaminophen 5-325 MG tablet Commonly known as:  NORCO/VICODIN Take 1 tablet by mouth every 6 (six) hours as needed for severe pain.   ipratropium-albuterol 0.5-2.5 (3) MG/3ML Soln Commonly known as:  DUONEB Take 3 mLs by nebulization 2 (two) times daily.   loperamide 2 MG tablet Commonly known as:  IMODIUM A-D Take 2 mg by mouth 4 (four) times daily as needed for diarrhea or loose stools.   losartan 25 MG tablet Commonly known as:  COZAAR Take 1 tablet (25 mg total) by mouth at bedtime.   metoprolol succinate 100 MG 24 hr tablet Commonly known as:  TOPROL-XL Take 1 tablet (100 mg total) by mouth at bedtime.   mirtazapine 15 MG tablet Commonly known as:  REMERON Take 15 mg by mouth at bedtime.   multivitamin-lutein Caps capsule Take 1 capsule by mouth daily.   NON FORMULARY Diet Type: NAS. NCS   NOVOLOG MIX 70/30 FLEXPEN (70-30) 100 UNIT/ML  FlexPen Generic drug:  insulin aspart protamine - aspart Inject 0.25 mLs (25 Units total) into the skin daily. What changed:  when to take this   nystatin powder Commonly known as:  MYCOSTATIN/NYSTOP Apply topically as directed. Apply with each ostomy change as instructed   omeprazole 40 MG capsule Commonly known as:  PRILOSEC Take 1 capsule (40 mg total) by mouth daily.   predniSONE 10 MG tablet Commonly known as:  DELTASONE Label  & dispense according to the schedule below. 5 Pills PO for 1 day then, 4 Pills PO for 1 day, 3 Pills PO for 1 day, 2 Pills PO for 1 day, 1 Pill PO for 1 days then STOP.   sertraline 100 MG tablet Commonly known as:  ZOLOFT Take 1 tablet (100 mg total) by mouth at bedtime.   tiotropium 18 MCG inhalation capsule Commonly known as:  SPIRIVA Place 18 mcg into inhaler and inhale daily.   traMADol 50 MG tablet Commonly known as:  ULTRAM Take 1 tablet (50 mg total) by mouth every 6 (six) hours as needed for moderate pain.   XARELTO 15 MG Tabs tablet Generic drug:  Rivaroxaban Take 1 tablet (15 mg total) by mouth at bedtime.            Durable Medical Equipment  (From admission, onward)         Start     Ordered   10/18/18 0737  For home use only DME Other see comment  Once    Comments:  KAFO BRACE for the left leg. Please contact Judy Pimple with [employer] to fit this patient with the brace.   Work: [phone], Mobile: [phone]   10/18/18 0739         Diagnostic Studies: Dg Chest 2 View  Result Date: 10/08/2018 CLINICAL DATA:  Shortness of breath. EXAM: CHEST - 2 VIEW COMPARISON:  Radiographs of October 07, 2018. FINDINGS: The heart size and mediastinal contours are within normal limits. No pneumothorax or pleural effusion is noted. Stable bibasilar subsegmental atelectasis or scarring is noted. Stable elevated right hemidiaphragm. The visualized skeletal structures are unremarkable. IMPRESSION: Stable bibasilar subsegmental atelectasis  or scarring. Electronically Signed   By: Lupita Raider, M.D.   On: 10/08/2018 16:00   Dg Chest 2 View  Result Date: 10/07/2018 CLINICAL DATA:  SOB today. Hx of diabetes, HTN, stroke. Former smoker. EXAM: CHEST - 2 VIEW COMPARISON:  10/06/2018 FINDINGS: Shallow lung inflation. There is subsegmental atelectasis at both lung bases. The heart size is normal. No pulmonary edema. Surgical clips overlie the RIGHT lung apex and LEFT LOWER neck. IMPRESSION: Subsegmental atelectasis at both lung bases. Electronically Signed   By: Norva PavlovElizabeth  Brown M.D.   On: 10/07/2018 14:20   Dg Chest 2 View  Result Date: 10/06/2018 CLINICAL DATA:  Difficulty breathing today, recent femoral surgery, some nausea and vomiting yesterday EXAM: CHEST - 2 VIEW COMPARISON:  Portable chest x-ray of 08/28/2018 FINDINGS: There is chronic elevation of the right hemidiaphragm with resultant right basilar linear atelectasis. No definite pneumonia or pleural effusion is seen. There is some peribronchial thickening which could indicate bronchitis. Mediastinal and hilar contours are unremarkable and heart size is stable. Clips are noted from prior thyroidectomy. No acute bony abnormality is seen. IMPRESSION: 1. Peribronchial thickening may indicate bronchitis. 2. Chronic elevation of the right hemidiaphragm with resultant right basilar linear atelectasis. Electronically Signed   By: Dwyane DeePaul  Barry M.D.   On: 10/06/2018 11:40   Dg Chest Port 1 View  Result Date: 10/06/2018 CLINICAL DATA:  Cough. EXAM: PORTABLE CHEST 1 VIEW COMPARISON:  Chest radiograph October 06, 2018 at 1124 hours. FINDINGS: Persistently elevated RIGHT hemidiaphragm with RIGHT lung base bandlike density. No pleural effusion or focal consolidation. Cardiomediastinal silhouette is normal, calcified aortic arch. No pneumothorax. Surgical clips RIGHT thoracic inlet and GE junction. Osteopenia. IMPRESSION: 1. Stable RIGHT lung base atelectasis/scarring. 2.  Aortic Atherosclerosis  (ICD10-I70.0). Electronically Signed   By: Awilda Metroourtnay  Bloomer M.D.   On: 10/06/2018 22:18   Dg Knee Right Port  Result Date: 10/07/2018 CLINICAL DATA:  ORIF EXAM: PORTABLE RIGHT KNEE - 1-2 VIEW COMPARISON:  Intraoperative fluoroscopic images dated 08/24/2018 FINDINGS: Lateral compression plate and screw fixation of a distal femoral shaft fracture. Fracture lucency remains visible. Deformity along the proximal femoral shaft, incompletely visualized. Prior right knee arthroplasty, without evidence of hardware complication. Visualized soft tissues are within normal limits. No suprapatellar knee joint effusion. IMPRESSION: Status post ORIF of a distal femoral shaft fracture. Fracture lucency remains visible. Prior right knee arthroplasty, without evidence of hardware complication. Electronically Signed   By: Charline BillsSriyesh  Krishnan M.D.   On: 10/07/2018 05:45   Dg C-arm 1-60 Min  Result Date: 10/18/2018 CLINICAL DATA:  Right leg surgery. EXAM: RIGHT FEMUR 2 VIEWS; DG C-ARM 61-120 MIN COMPARISON:  10/24/2017 FINDINGS: Single fluoroscopic image demonstrates partial visualization of the right knee replacement. The plate and screw fixation in the distal femur has been removed. IMPRESSION: Interval removal of the distal femoral plate and screws. Partial visualization of the knee replacement. Electronically Signed   By: Richarda OverlieAdam  Henn M.D.   On: 10/18/2018 16:57   Dg Femur, Min 2 Views Right  Result Date: 10/18/2018 CLINICAL DATA:  Right leg surgery. EXAM: RIGHT FEMUR 2 VIEWS; DG C-ARM 61-120 MIN COMPARISON:  10/24/2017 FINDINGS: Single fluoroscopic image demonstrates partial visualization of the right knee replacement. The plate and screw fixation in the distal femur has been removed. IMPRESSION: Interval removal of the distal femoral plate and screws. Partial visualization of the knee replacement. Electronically Signed   By: Richarda OverlieAdam  Henn M.D.   On: 10/18/2018 16:57   Disposition: Plan will be for discharge home with HHPT  today.  Sensitivities demonstrate Bactrim to be sensitive, will send into the patient's pharmacy.    Follow-up Information  Poggi, Excell SeltzerJohn J, MD Follow up in 1 week(s).   Specialty:  Surgery Why:  Skin Check. Contact information: [address] [facility] [place] [id number] [phone]          Signed: Meriel PicaJames L  PA-C 10/21/2018, 2:42 PM

## 2018-10-21 NOTE — Progress Notes (Signed)
Patient ID: Gary Thomas, male   DOB: 12-16-1931, 85 y.o.   MRN: [id number]  [employer] PROGRESS NOTE  Gary Thomas DOB: [birth year] DOA: 10/17/2018 PCP: Gary Arbour, MD  HPI/Subjective: Patient feels okay.  Offers no complaints.  As per nursing staff may go home today.  Objective: Vitals:   10/21/18 0735 10/21/18 0824  BP:  134/61  Pulse:  84  Resp:    Temp:  97.8 F (36.6 C)  SpO2: 91% 94%    Filed Weights   10/17/18 1159  Weight: 93 kg    ROS: Review of Systems  Constitutional: Negative for chills and fever.  Eyes: Negative for blurred vision.  Respiratory: Negative for cough and shortness of breath.   Cardiovascular: Negative for chest pain.  Gastrointestinal: Positive for constipation. Negative for abdominal pain, diarrhea, nausea and vomiting.  Genitourinary: Negative for dysuria.  Musculoskeletal: Negative for joint pain.  Neurological: Negative for dizziness and headaches.   Exam: Physical Exam  HENT:  Nose: No mucosal edema.  Mouth/Throat: No oropharyngeal exudate or posterior oropharyngeal edema.  Eyes: Pupils are equal, round, and reactive to light. Conjunctivae, EOM and lids are normal.  Neck: No JVD present. Carotid bruit is not present. No edema present. No thyroid mass and no thyromegaly present.  Cardiovascular: S1 normal and S2 normal. Exam reveals no gallop.  No murmur heard. Respiratory: No respiratory distress. He has no wheezes. He has no rhonchi. He has no rales.  GI: Soft. Bowel sounds are normal. There is no abdominal tenderness.  Musculoskeletal:     Right shoulder: He exhibits no swelling.  Lymphadenopathy:    He has no cervical adenopathy.  Neurological: He is alert. No cranial nerve deficit.  Skin: Skin is warm. No rash noted. Nails show no clubbing.  Psychiatric: He has a normal mood and affect.      Data Reviewed: Basic Metabolic Panel: Recent Labs  Lab 10/17/18 1501 10/19/18 0431 10/20/18 0325  10/21/18 0316  NA 140 137 137 140  K 4.6 4.6 4.4 4.5  CL 103 105 105 104  CO2 30 25 26 31   GLUCOSE 126* 161* 162* 104*  BUN 47* 36* 37* 29*  CREATININE 1.51* 1.42* 1.53* 1.49*  CALCIUM 8.7* 8.2* 8.0* 8.4*   CBC: Recent Labs  Lab 10/17/18 1501 10/19/18 0431 10/20/18 0325 10/21/18 0316  WBC 18.9* 16.8* 11.9* 8.9  NEUTROABS 14.8* 15.2*  --   --   HGB 11.1* 9.3* 8.1* 9.2*  HCT 37.1* 31.2* 27.6* 31.5*  MCV 86.3 85.5 86.8 86.8  PLT 224 152 137* 156   BNP (last 3 results) Recent Labs    08/28/18 0944  BNP 169.0*    CBG: Recent Labs  Lab 10/20/18 0829 10/20/18 1229 10/20/18 1637 10/20/18 2130 10/21/18 0825  GLUCAP 83 109* 102* 125* 113*    Recent Results (from the past 240 hour(s))  Surgical PCR screen     Status: None   Collection Time: 10/17/18 10:52 AM  Result Value Ref Range Status   MRSA, PCR NEGATIVE NEGATIVE Final   Staphylococcus aureus NEGATIVE NEGATIVE Final    Comment: (NOTE) The Xpert SA Assay (FDA approved for NASAL specimens in patients 50 years of age and older), is one component of a comprehensive surveillance program. It is not intended to diagnose infection nor to guide or monitor treatment. Performed at [facility], [address] [place], [place] [address]   Aerobic/Anaerobic Culture (surgical/deep wound)     Status: None (Preliminary result)  Collection Time: 10/18/18  3:44 PM  Result Value Ref Range Status   Specimen Description   Final    WOUND Performed at [facility], [address], [place], [place] [address]    Special Requests   Final    RT THIGH Performed at [facility], [address] [place], [place] [address]    Gram Stain   Final    ABUNDANT WBC PRESENT,BOTH PMN AND MONONUCLEAR RARE GRAM POSITIVE COCCI    Culture   Final    FEW STAPHYLOCOCCUS AUREUS SUSCEPTIBILITIES TO FOLLOW Performed at [facility], [address], [place], [place] [address]    Report Status PENDING   Incomplete      Scheduled Meds: . allopurinol  50 mg Oral Q48H  . atorvastatin  20 mg Oral QHS  . bisacodyl  10 mg Rectal Once  . budesonide  0.5 mg Nebulization BID  . cholecalciferol  4,000 Units Oral Daily  . docusate sodium  100 mg Oral BID  . ferrous sulfate  325 mg Oral TID WC  . furosemide  40 mg Oral QHS  . insulin aspart  0-15 Units Subcutaneous TID WC  . insulin aspart  0-5 Units Subcutaneous QHS  . insulin aspart protamine- aspart  15 Units Subcutaneous Q breakfast  . ipratropium-albuterol  3 mL Nebulization BID  . losartan  25 mg Oral QHS  . metoprolol succinate  100 mg Oral QHS  . mirtazapine  15 mg Oral QHS  . multivitamin-lutein  1 capsule Oral Daily  . pantoprazole  40 mg Oral Daily  . Rivaroxaban  15 mg Oral QAC supper  . sertraline  100 mg Oral QHS  . tiotropium  18 mcg Inhalation Daily   Continuous Infusions: . vancomycin 750 mg (10/20/18 1617)    Assessment/Plan:  1. Mild COPD exacerbation.  Improved with nebulizer treatments only.  Off oxygen now. 2. Type 2 diabetes mellitus.  Patient states he uses sliding scale insulin at home and is able to manage his diabetes.  I told him I get concerned if he does not eat and takes insulin.  Advised that he must eat better.  He states he will eat better at home.  Sliding scale for now. 3. Hypertension on losartan and metoprolol 4. Hyperlipidemia unspecified on atorvastatin 5. History of gout on allopurinol 6. Infection of hardware status post removal.  As per orthopedics likely switch to oral antibiotics once sensitivities obtained 7. History of pulmonary emboli on Xarelto 8. Depression on Zoloft 9. Constipation.  Give Dulcolax suppository  Code Status:     Code Status Orders  (From admission, onward)         Start     Ordered   10/19/18 1056  Full code  Continuous     10/19/18 1055        Code Status History    Date Active Date Inactive Code Status Order ID Comments User Context   10/18/2018 2058  10/19/2018 1055 DNR [id number]  Milagros Loll, MD Inpatient   10/18/2018 1925 10/18/2018 2057 Full Code [id number]  Christena Flake, MD Inpatient   10/17/2018 1423 10/18/2018 1826 Full Code [id number]  Christena Flake, MD Inpatient   10/06/2018 1640 10/10/2018 1849 Full Code [id number]  Katha Hamming, MD ED   08/28/2018 1334 08/30/2018 1711 Full Code [id number]  Houston Siren, MD Inpatient   08/23/2018 0125 08/26/2018 2201 Full Code [id number]  Cammy Copa, MD Inpatient   02/15/2018 2012 02/21/2018 1700 Full Code [id number]  Maeola HarmanStern, Joseph, MD Inpatient   03/02/2017 1938 03/05/2017 1439 Full Code [id number]  Kennedy BuckerMenz, Michael, MD Inpatient     Disposition Plan: As per orthopedics  Antibiotics:  Vancomycin  Time spent: 25 minutes.  I will sign off since the patient may be going home today or tomorrow.  Faiz Weber [employer]  [employer]

## 2018-10-21 NOTE — Progress Notes (Signed)
Physical Therapy Treatment Patient Details Name: Gary Thomas MRN: [id number] DOB: Feb 24, 1932 Today's Date: 10/21/2018    History of Present Illness Pt is 85 yo male s/p ORIF of R upper condylar femur fx, developed infection, presented to ED with SOB and fever; underwent I&D and hardware removal 10/18/2018. PMH of DM, HTN, pulmonary emboli, renal insufficiency (1kidney) stroke (R sided weakness)    PT Comments    Patient agreeable to OOB with increased encouragement; continues to require +2 for optimal safety.  Constant cuing/assist for forward trunk lean, weight shift throughout transfers; limited carry-over between sessions.  Did demonstrate improved active effort/participation with transfers this session; recommend continued use of SB for patient comfort/confidence with transfer.    Follow Up Recommendations  Home health PT;Supervision/Assistance - 24 hour     Equipment Recommendations  None recommended by PT;Other (comment)    Recommendations for Other Services       Precautions / Restrictions Precautions Precautions: Knee;Fall Required Braces or Orthoses: Knee Immobilizer - Right Knee Immobilizer - Right: On at all times;Other (comment) Restrictions Weight Bearing Restrictions: No RLE Weight Bearing: Non weight bearing    Mobility  Bed Mobility Overal bed mobility: Needs Assistance Bed Mobility: Supine to Sit     Supine to sit: Min assist;Mod assist     General bed mobility comments: assist for R LE management, truncal elevation  Transfers Overall transfer level: Needs assistance   Transfers: Lateral/Scoot Transfers          Lateral/Scoot Transfers: Mod assist;+2 physical assistance General transfer comment: continued cuing/assist for trunk position and control throughout transfer; improved active participation with transfer this date  Ambulation/Gait             General Gait Details: unsafe/unable   Stairs             Wheelchair  Mobility    Modified Rankin (Stroke Patients Only)       Balance Overall balance assessment: Needs assistance Sitting-balance support: No upper extremity supported;Feet supported Sitting balance-Leahy Scale: Fair Sitting balance - Comments: constant instruction for trunk control, forward trunk lean                                    Cognition Arousal/Alertness: Awake/alert Behavior During Therapy: WFL for tasks assessed/performed Overall Cognitive Status: Within Functional Limits for tasks assessed                                        Exercises Other Exercises Other Exercises: Long-sitting LE therex, 1x10, AROM: ankle pumps, quad sets, hip abduct/adduct.  Constant cuing for full, active effort with isolated therex.    General Comments        Pertinent Vitals/Pain Pain Assessment: Faces Faces Pain Scale: Hurts even more Pain Location: R knee Pain Descriptors / Indicators: Aching;Grimacing;Guarding Pain Intervention(s): Limited activity within patient's tolerance;Monitored during session;Repositioned    Home Living                      Prior Function            PT Goals (current goals can now be found in the care plan section) Acute Rehab PT Goals Patient Stated Goal: to decrease pain PT Goal Formulation: With patient Time For Goal Achievement: 11/02/18 Potential to Achieve Goals: Good Progress towards PT  goals: Progressing toward goals    Frequency    BID      PT Plan Current plan remains appropriate    Co-evaluation              AM-PAC PT "6 Clicks" Mobility   Outcome Measure  Help needed turning from your back to your side while in a flat bed without using bedrails?: A Little Help needed moving from lying on your back to sitting on the side of a flat bed without using bedrails?: A Little Help needed moving to and from a bed to a chair (including a wheelchair)?: A Lot Help needed standing up from a  chair using your arms (e.g., wheelchair or bedside chair)?: Total Help needed to walk in hospital room?: Total Help needed climbing 3-5 steps with a railing? : Total 6 Click Score: 11    End of Session Equipment Utilized During Treatment: Gait belt Activity Tolerance: Patient tolerated treatment well Patient left: in chair;with call bell/phone within reach;with chair alarm set Nurse Communication: Mobility status PT Visit Diagnosis: Difficulty in walking, not elsewhere classified (R26.2);Other abnormalities of gait and mobility (R26.89);Muscle weakness (generalized) (M62.81);Pain Pain - Right/Left: Right Pain - part of body: Knee     Time: 0347-4259 PT Time Calculation (min) (ACUTE ONLY): 27 min  Charges:  $Therapeutic Exercise: 8-22 mins $Therapeutic Activity: 8-22 mins                     Izadora Roehr H. Manson Passey, PT, DPT, NCS 10/21/18, 2:55 PM [phone]

## 2018-10-21 NOTE — Progress Notes (Signed)
Pt transported home via EMS. Personal belongings sent home with pt

## 2018-10-21 NOTE — Care Management (Signed)
EMS form completed, RN notified, no further CM needs

## 2018-10-21 NOTE — Anesthesia Postprocedure Evaluation (Signed)
Anesthesia Post Note  Patient: Gary Thomas  Procedure(s) Performed: IRRIGATION AND DEBRIDEMENT EXTREMITY, POSSIBLE HARDWARE REMOVAL OF SUPERCONDYLAR PLATE (Right ) HARDWARE REMOVAL- DISTAL FEMUR FRACTURE FIXATION (Right )  Patient location during evaluation: Other Anesthesia Type: General Level of consciousness: awake and alert Pain management: pain level controlled Vital Signs Assessment: post-procedure vital signs reviewed and stable Respiratory status: spontaneous breathing, nonlabored ventilation, respiratory function stable and patient connected to nasal cannula oxygen Cardiovascular status: blood pressure returned to baseline and stable Postop Assessment: no apparent nausea or vomiting Anesthetic complications: no     Last Vitals:  Vitals:   10/21/18 0735 10/21/18 0824  BP:  134/61  Pulse:  84  Resp:    Temp:  36.6 C  SpO2: 91% 94%    Last Pain:  Vitals:   10/21/18 0824  TempSrc: Oral  PainSc:                  Starling Manns

## 2018-10-21 NOTE — Progress Notes (Signed)
Subjective: 3 Days Post-Op Procedure(s) (LRB): IRRIGATION AND DEBRIDEMENT EXTREMITY, POSSIBLE HARDWARE REMOVAL OF SUPERCONDYLAR PLATE (Right) HARDWARE REMOVAL- DISTAL FEMUR FRACTURE FIXATION (Right) Patient reports pain as moderate. Patient is well,laying in bed with woundvac in place. Current plan is for discharge home with HHPT. Patient history positive for COPD. Fever: no Gastrointestinal:Negative for nausea and vomiting  Objective: Vital signs in last 24 hours: Temp:  [97.8 F (36.6 C)] 97.8 F (36.6 C) (01/09 1510) Pulse Rate:  [59-73] 70 (01/09 2054) BP: (112-132)/(47-52) 123/52 (01/09 2054) SpO2:  [91 %-97 %] 91 % (01/10 0735)  Intake/Output from previous day:  Intake/Output Summary (Last 24 hours) at 10/21/2018 0751 Last data filed at 10/21/2018 0601 Gross per 24 hour  Intake 120 ml  Output 1825 ml  Net -1705 ml    Intake/Output this shift: No intake/output data recorded.  Labs: Recent Labs    10/19/18 0431 10/20/18 0325 10/21/18 0316  HGB 9.3* 8.1* 9.2*   Recent Labs    10/20/18 0325 10/21/18 0316  WBC 11.9* 8.9  RBC 3.18* 3.63*  HCT 27.6* 31.5*  PLT 137* 156   Recent Labs    10/20/18 0325 10/21/18 0316  NA 137 140  K 4.4 4.5  CL 105 104  CO2 26 31  BUN 37* 29*  CREATININE 1.53* 1.49*  GLUCOSE 162* 104*  CALCIUM 8.0* 8.4*   No results for input(s): LABPT, INR in the last 72 hours.   EXAM General - Patient is Alert and Oriented Extremity - ABD soft Sensation intact distally Intact pulses distally Dorsiflexion/Plantar flexion intact Incision: dressing C/D/I No cellulitis present  2+ pitting edema to the right lower extremity. Dressing/Incision - woundvac intact with mild bloody drainage, no purulent discharge.  Motor Function - intact, moving foot and toes well on exam.   Past Medical History:  Diagnosis Date  . Depression   . Diabetes mellitus without complication (HCC)   . Dyspnea   . Elevated lipids   . Femur fracture (HCC)  08/22/2018  . GERD (gastroesophageal reflux disease)   . Hypertension   . Pulmonary emboli (HCC)   . Renal insufficiency    only has 1 kidney  . Sleep apnea   . Stroke (HCC)    Rt side weakness    Assessment/Plan: 3 Days Post-Op Procedure(s) (LRB): IRRIGATION AND DEBRIDEMENT EXTREMITY, POSSIBLE HARDWARE REMOVAL OF SUPERCONDYLAR PLATE (Right) HARDWARE REMOVAL- DISTAL FEMUR FRACTURE FIXATION (Right) Active Problems:   Closed nondisplaced supracondylar fracture of distal end of femur without intracondylar extension (HCC)   Postoperative wound infection   Supracondylar fracture of distal end of femur without intracondylar extension (HCC)  Estimated body mass index is 30.28 kg/m as calculated from the following:   Height as of this encounter: 5\' 9"  (1.753 m).   Weight as of this encounter: 93 kg. Advance diet Up with therapy   Labs reviewed this AM, WBC 8.9 currently on Vancomycin.   Cultures have grown Staph Aureus, awaiting sensitivities.  Will switch to PO antibiotics prior to discharge. Switch to Prevena woundvac prior to discharge home, continue Woundvac for additional week.   Plan for patient follow-up next Friday in the clinic. NWB to the right leg but can work on gentle flexion and extension. Plan for discharge home with HHPT today.  DVT Prophylaxis - Xarelto and Foot Pumps NWB to right leg.  Valeria BatmanJ. Lance , PA-C [facility] 10/21/2018, 7:51 AM

## 2018-10-21 NOTE — Discharge Instructions (Signed)
Diet: As you were doing prior to hospitalization   Shower:  May shower but keep the wounds dry, use an occlusive plastic wrap, NO SOAKING IN TUB.  If the bandage gets wet, change with a clean dry gauze.  Dressing:  Leave Woundvac on until next week.    Activity:  Increase activity slowly as tolerated, but follow the weight bearing instructions below.  No lifting or driving for 6 weeks.  Weight Bearing:   Toe-touch weightbearing for transfers to the right leg.  To prevent constipation: you may use a stool softener such as -  Colace (over the counter) 100 mg by mouth twice a day  Drink plenty of fluids (prune juice may be helpful) and high fiber foods Miralax (over the counter) for constipation as needed.    Itching:  If you experience itching with your medications, try taking only a single pain pill, or even half a pain pill at a time.  You may take up to 10 pain pills per day, and you can also use benadryl over the counter for itching or also to help with sleep.   Precautions:  If you experience chest pain or shortness of breath - call 911 immediately for transfer to the hospital emergency department!!  If you develop a fever greater that 101 F, purulent drainage from wound, increased redness or drainage from wound, or calf pain-Call [facility]                                              Follow- Up Appointment:  Please call for an appointment to be seen in 2 weeks at [facility]

## 2018-10-21 NOTE — Consult Note (Signed)
Pharmacy Antibiotic Note  Gary Thomas is a 85 y.o. male admitted on 10/17/2018 with wound infection.  Patient had a right distal femoral incision drainage. Pharmacy has been consulted for Vancomycin dosing.  Plan: Patient is at steady state for vancomycin. Cultures grew staph aureus and plan is to discharge on oral antibiotic. Will follow for disposition and susceptibilities before ordering vancomycin levels.   Height: 5\' 9"  (175.3 cm) Weight: 205 lb 0.4 oz (93 kg) IBW/kg (Calculated) : 70.7  Temp (24hrs), Avg:97.8 F (36.6 C), Min:97.8 F (36.6 C), Max:97.8 F (36.6 C)  Recent Labs  Lab 10/17/18 1501 10/19/18 0431 10/20/18 0325 10/21/18 0316  WBC 18.9* 16.8* 11.9* 8.9  CREATININE 1.51* 1.42* 1.53* 1.49*    Estimated Creatinine Clearance: 41.6 mL/min (A) (by C-G formula based on SCr of 1.49 mg/dL (H)).    Allergies  Allergen Reactions  . Codeine Anaphylaxis  . Morphine Anaphylaxis  . Gabapentin Other (See Comments)    Unsure of what the reaction was   . Stadol [Butorphanol]     Unknown  . Lyrica [Pregabalin] Rash  . Tapentadol Nausea Only    ONSET 01/04/2013  . Tramadol     Hallucinations "I see giant spiders on ceiling". Pt also believes he is in [facility], but he is in [place].    Antimicrobials this admission: 1/7 Vancomycin >>  Dose adjustments this admission: N/A  Microbiology results: 1/7 Wound Cx pending  1/7 MRSA PCR: (-)  Thank you for allowing pharmacy to be a part of this patient's care.  Carola Frost, PharmD, BCPS Clinical Pharmacist 10/21/2018 8:05 AM

## 2018-10-21 NOTE — Care Management (Signed)
Gary Thomas with [facility] notified that patient will have a Prevena VAC. Per note patient will transition to PO antibiotics.

## 2018-10-24 LAB — AEROBIC/ANAEROBIC CULTURE W GRAM STAIN (SURGICAL/DEEP WOUND)

## 2018-10-24 LAB — GLUCOSE, CAPILLARY: GLUCOSE-CAPILLARY: 167 mg/dL — AB (ref 70–99)

## 2018-10-24 LAB — AEROBIC/ANAEROBIC CULTURE (SURGICAL/DEEP WOUND)

## 2018-10-27 ENCOUNTER — Other Ambulatory Visit: Payer: Self-pay | Admitting: Adult Health

## 2018-11-19 ENCOUNTER — Other Ambulatory Visit: Payer: Self-pay | Admitting: Adult Health

## 2019-01-16 ENCOUNTER — Other Ambulatory Visit: Payer: Self-pay | Admitting: Adult Health

## 2019-02-07 ENCOUNTER — Other Ambulatory Visit: Payer: Self-pay

## 2019-02-07 ENCOUNTER — Inpatient Hospital Stay
Admission: EM | Admit: 2019-02-07 | Discharge: 2019-02-10 | DRG: 378 | Disposition: A | Discharge status: Home Health | Payer: Medicare Other | Source: Ambulatory Visit | Attending: Internal Medicine | Admitting: Internal Medicine

## 2019-02-07 DIAGNOSIS — J449 Chronic obstructive pulmonary disease, unspecified: Secondary | ICD-10-CM | POA: Diagnosis present

## 2019-02-07 DIAGNOSIS — I129 Hypertensive chronic kidney disease with stage 1 through stage 4 chronic kidney disease, or unspecified chronic kidney disease: Secondary | ICD-10-CM | POA: Diagnosis present

## 2019-02-07 DIAGNOSIS — D509 Iron deficiency anemia, unspecified: Secondary | ICD-10-CM | POA: Diagnosis present

## 2019-02-07 DIAGNOSIS — R05 Cough: Secondary | ICD-10-CM

## 2019-02-07 DIAGNOSIS — Z87891 Personal history of nicotine dependence: Secondary | ICD-10-CM

## 2019-02-07 DIAGNOSIS — I2782 Chronic pulmonary embolism: Secondary | ICD-10-CM | POA: Diagnosis present

## 2019-02-07 DIAGNOSIS — Q600 Renal agenesis, unilateral: Secondary | ICD-10-CM

## 2019-02-07 DIAGNOSIS — K922 Gastrointestinal hemorrhage, unspecified: Secondary | ICD-10-CM | POA: Diagnosis present

## 2019-02-07 DIAGNOSIS — Z7951 Long term (current) use of inhaled steroids: Secondary | ICD-10-CM

## 2019-02-07 DIAGNOSIS — K219 Gastro-esophageal reflux disease without esophagitis: Secondary | ICD-10-CM | POA: Diagnosis present

## 2019-02-07 DIAGNOSIS — Z8249 Family history of ischemic heart disease and other diseases of the circulatory system: Secondary | ICD-10-CM

## 2019-02-07 DIAGNOSIS — D6832 Hemorrhagic disorder due to extrinsic circulating anticoagulants: Secondary | ICD-10-CM | POA: Diagnosis present

## 2019-02-07 DIAGNOSIS — T45515A Adverse effect of anticoagulants, initial encounter: Secondary | ICD-10-CM | POA: Diagnosis present

## 2019-02-07 DIAGNOSIS — M1A30X0 Chronic gout due to renal impairment, unspecified site, without tophus (tophi): Secondary | ICD-10-CM | POA: Diagnosis present

## 2019-02-07 DIAGNOSIS — N179 Acute kidney failure, unspecified: Secondary | ICD-10-CM | POA: Diagnosis present

## 2019-02-07 DIAGNOSIS — I69351 Hemiplegia and hemiparesis following cerebral infarction affecting right dominant side: Secondary | ICD-10-CM

## 2019-02-07 DIAGNOSIS — E785 Hyperlipidemia, unspecified: Secondary | ICD-10-CM | POA: Diagnosis present

## 2019-02-07 DIAGNOSIS — E1151 Type 2 diabetes mellitus with diabetic peripheral angiopathy without gangrene: Secondary | ICD-10-CM | POA: Diagnosis present

## 2019-02-07 DIAGNOSIS — Z7401 Bed confinement status: Secondary | ICD-10-CM

## 2019-02-07 DIAGNOSIS — F0390 Unspecified dementia without behavioral disturbance: Secondary | ICD-10-CM | POA: Diagnosis present

## 2019-02-07 DIAGNOSIS — M4316 Spondylolisthesis, lumbar region: Secondary | ICD-10-CM | POA: Diagnosis present

## 2019-02-07 DIAGNOSIS — Z79899 Other long term (current) drug therapy: Secondary | ICD-10-CM

## 2019-02-07 DIAGNOSIS — E1169 Type 2 diabetes mellitus with other specified complication: Secondary | ICD-10-CM | POA: Diagnosis present

## 2019-02-07 DIAGNOSIS — D649 Anemia, unspecified: Secondary | ICD-10-CM

## 2019-02-07 DIAGNOSIS — D62 Acute posthemorrhagic anemia: Secondary | ICD-10-CM | POA: Diagnosis present

## 2019-02-07 DIAGNOSIS — E860 Dehydration: Secondary | ICD-10-CM | POA: Diagnosis present

## 2019-02-07 DIAGNOSIS — F329 Major depressive disorder, single episode, unspecified: Secondary | ICD-10-CM | POA: Diagnosis present

## 2019-02-07 DIAGNOSIS — D72829 Elevated white blood cell count, unspecified: Secondary | ICD-10-CM

## 2019-02-07 DIAGNOSIS — Z794 Long term (current) use of insulin: Secondary | ICD-10-CM

## 2019-02-07 DIAGNOSIS — N183 Chronic kidney disease, stage 3 (moderate): Secondary | ICD-10-CM | POA: Diagnosis present

## 2019-02-07 DIAGNOSIS — Z7901 Long term (current) use of anticoagulants: Secondary | ICD-10-CM

## 2019-02-07 DIAGNOSIS — G4733 Obstructive sleep apnea (adult) (pediatric): Secondary | ICD-10-CM | POA: Diagnosis present

## 2019-02-07 DIAGNOSIS — Z823 Family history of stroke: Secondary | ICD-10-CM

## 2019-02-07 DIAGNOSIS — E1122 Type 2 diabetes mellitus with diabetic chronic kidney disease: Secondary | ICD-10-CM | POA: Diagnosis present

## 2019-02-07 DIAGNOSIS — Z885 Allergy status to narcotic agent status: Secondary | ICD-10-CM

## 2019-02-07 DIAGNOSIS — Z888 Allergy status to other drugs, medicaments and biological substances status: Secondary | ICD-10-CM

## 2019-02-07 DIAGNOSIS — D508 Other iron deficiency anemias: Secondary | ICD-10-CM | POA: Diagnosis not present

## 2019-02-07 DIAGNOSIS — Z79891 Long term (current) use of opiate analgesic: Secondary | ICD-10-CM

## 2019-02-07 DIAGNOSIS — R059 Cough, unspecified: Secondary | ICD-10-CM

## 2019-02-07 LAB — BASIC METABOLIC PANEL
Anion gap: 12 (ref 5–15)
BUN: 53 mg/dL — ABNORMAL HIGH (ref 8–23)
CO2: 30 mmol/L (ref 22–32)
Calcium: 9.2 mg/dL (ref 8.9–10.3)
Chloride: 99 mmol/L (ref 98–111)
Creatinine, Ser: 2.16 mg/dL — ABNORMAL HIGH (ref 0.61–1.24)
GFR calc Af Amer: 31 mL/min — ABNORMAL LOW (ref >60)
GFR calc non Af Amer: 27 mL/min — ABNORMAL LOW (ref >60)
Glucose, Bld: 178 mg/dL — ABNORMAL HIGH (ref 70–99)
Potassium: 4.8 mmol/L (ref 3.5–5.1)
Sodium: 141 mmol/L (ref 135–145)

## 2019-02-07 LAB — CBC WITH DIFFERENTIAL/PLATELET
Abs Immature Granulocytes: 0.1 10*3/uL — ABNORMAL HIGH (ref 0.00–0.07)
Basophils Absolute: 0 10*3/uL (ref 0.0–0.1)
Basophils Relative: 0 %
Eosinophils Absolute: 0.2 10*3/uL (ref 0.0–0.5)
Eosinophils Relative: 1 %
HCT: 21.8 % — ABNORMAL LOW (ref 39.0–52.0)
Hemoglobin: 5.8 g/dL — ABNORMAL LOW (ref 13.0–17.0)
Immature Granulocytes: 1 %
Lymphocytes Relative: 7 %
Lymphs Abs: 1.2 10*3/uL (ref 0.7–4.0)
MCH: 20.7 pg — ABNORMAL LOW (ref 26.0–34.0)
MCHC: 26.6 g/dL — ABNORMAL LOW (ref 30.0–36.0)
MCV: 77.9 fL — ABNORMAL LOW (ref 80.0–100.0)
Monocytes Absolute: 1.4 10*3/uL — ABNORMAL HIGH (ref 0.1–1.0)
Monocytes Relative: 8 %
Neutro Abs: 13.3 10*3/uL — ABNORMAL HIGH (ref 1.7–7.7)
Neutrophils Relative %: 83 %
Platelets: 221 10*3/uL (ref 150–400)
RBC: 2.8 MIL/uL — ABNORMAL LOW (ref 4.22–5.81)
RDW: 16.6 % — ABNORMAL HIGH (ref 11.5–15.5)
WBC: 16.1 10*3/uL — ABNORMAL HIGH (ref 4.0–10.5)
nRBC: 0.1 % (ref 0.0–0.2)

## 2019-02-07 LAB — PROTIME-INR
INR: 1.1 (ref 0.8–1.2)
Prothrombin Time: 14.2 seconds (ref 11.4–15.2)

## 2019-02-07 LAB — PREPARE RBC (CROSSMATCH)

## 2019-02-07 MED ORDER — SODIUM CHLORIDE 0.9% IV SOLUTION
Freq: Once | INTRAVENOUS | Status: AC
  Administered 2019-02-08: 01:00:00 via INTRAVENOUS
  Filled 2019-02-07: qty 250

## 2019-02-07 NOTE — ED Triage Notes (Signed)
Pt was told to come to the ED to receive to blood transfusion due to a low Hbg. Pt stated that he feels week.

## 2019-02-07 NOTE — H&P (Signed)
[facility] - [place] [facility]   PATIENT NAME: Gary Thomas    MR#:  [id number]  DATE OF BIRTH:  12-12-1931  DATE OF ADMISSION:  02/07/2019  PRIMARY CARE PHYSICIAN: Marguarite ArbourSparks, [name], MD   REQUESTING/REFERRING PHYSICIAN: Dionne BucySebastian Siadecki, MD  CHIEF COMPLAINT:   Chief Complaint  Patient presents with  . Abnormal Lab    HISTORY OF PRESENT ILLNESS:  Gary Thomas  is a 85 y.o. male with a known history of hypertension, type 2 diabetes, history of stroke with residual right-sided weakness, OSA, recent femur fracture, hyperlipidemia, depression, CKD 3, history of PE on Xarelto who was sent to the ED from his PCPs office due to low hemoglobin.  He was seen today in his PCPs office with weakness and fatigue.  He has been unable to ambulate at home and family has been having a hard time caring for him at home.  He denies any chest pain, shortness of breath, palpitations.  In the ED, he was mildly hypertensive.  Labs are significant for hemoglobin 5.8, creatinine 2.16, WBC 16.1.  Stool guaiac was positive.  Hospitalists were called for admission.  PAST MEDICAL HISTORY:   Past Medical History:  Diagnosis Date  . Depression   . Diabetes mellitus without complication (HCC)   . Dyspnea   . Elevated lipids   . Femur fracture (HCC) 08/22/2018  . GERD (gastroesophageal reflux disease)   . Hypertension   . Pulmonary emboli (HCC)   . Renal insufficiency    only has 1 kidney  . Sleep apnea   . Stroke (HCC)    Rt side weakness    PAST SURGICAL HISTORY:   Past Surgical History:  Procedure Laterality Date  . 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 AORTA SURGERY    . CAROTID ARTERY - SUBCLAVIAN ARTERY BYPASS GRAFT Bilateral   . CATARACT EXTRACTION W/ INTRAOCULAR LENS  IMPLANT, BILATERAL    . CHOLECYSTECTOMY    . EYE SURGERY    . FEMUR FRACTURE SURGERY Right   . HIP FRACTURE SURGERY Bilateral   . I&D EXTREMITY Right  10/18/2018   Procedure: IRRIGATION AND DEBRIDEMENT EXTREMITY, POSSIBLE HARDWARE REMOVAL OF SUPERCONDYLAR PLATE;  Surgeon: Christena FlakePoggi, John J, MD;  Location: [facility] ORS;  Service: Orthopedics;  Laterality: Right;  . JOINT REPLACEMENT    . ORIF FEMUR FRACTURE Right 08/24/2018   Procedure: OPEN REDUCTION INTERNAL FIXATION (ORIF) SUPRACONDYLAR FEMUR FRACTURE;  Surgeon: Christena FlakePoggi, John J, MD;  Location: [facility] OR[facility];  Service: Orthopedics;  Laterality: Right;  . ORIF FEMUR FRACTURE Right 10/18/2018   Procedure: HARDWARE REMOVAL- DISTAL FEMUR FRACTURE FIXATION;  Surgeon: Christena FlakePoggi, John J, MD;  Location: [facility] ORS;  Service: Orthopedics;  Laterality: Right;  . PROSTATE SURGERY    . REPLACEMENT TOTAL KNEE Right   . TOTAL HIP ARTHROPLASTY Right 03/02/2017   Procedure: TOTAL HIP ARTHROPLASTY ANTERIOR APPROACH;  Surgeon: Kennedy BuckerMenz, Michael, MD;  Location: [facility] ORS;  Service: Orthopedics;  Laterality: Right;  Marland Kitchen. VASCULAR SURGERY      SOCIAL HISTORY:   Social History   Tobacco Use  . Smoking status: Former Smoker    Packs/day: 1.00    Years: 7.00    Pack years: 7.00    Types: Cigarettes    Last attempt to quit: 02/18/1952    Years since quitting: 67.0  . Smokeless tobacco: Former Engineer, waterUser  Substance Use Topics  . Alcohol use: No    FAMILY HISTORY:   Family History  Problem  Relation Age of Onset  . Heart attack Father   . Stroke Father   . Heart disease Father   . Heart attack Mother     DRUG ALLERGIES:   Allergies  Allergen Reactions  . Codeine Anaphylaxis  . Morphine Anaphylaxis  . Gabapentin Other (See Comments)    Unsure of what the reaction was   . Stadol [Butorphanol]     Unknown  . Lyrica [Pregabalin] Rash  . Tapentadol Nausea Only    ONSET 01/04/2013  . Tramadol     Hallucinations "I see giant spiders on ceiling". Pt also believes he is in [facility], but he is in [place].    REVIEW OF SYSTEMS:   Review of Systems  Constitutional: Positive for malaise/fatigue. Negative for chills and fever.  HENT:  Negative for congestion and sore throat.   Eyes: Negative for blurred vision and double vision.  Respiratory: Positive for cough and sputum production. Negative for shortness of breath.   Cardiovascular: Negative for chest pain, palpitations and leg swelling.  Gastrointestinal: Negative for nausea and vomiting.  Genitourinary: Negative for flank pain and hematuria.  Musculoskeletal: Negative for back pain and neck pain.  Neurological: Positive for weakness. Negative for dizziness, focal weakness and headaches.  Psychiatric/Behavioral: Negative for depression. The patient is not nervous/anxious.     MEDICATIONS AT HOME:   Prior to Admission medications   Medication Sig Start Date End Date Taking? Authorizing Provider  acetaminophen (TYLENOL) 325 MG tablet Take 2 tablets (650 mg total) by mouth every 6 (six) hours as needed for mild pain (or Fever >/= 101). 08/30/18   Gouru, Deanna Artis, MD  albuterol (PROVENTIL) (2.5 MG/3ML) 0.083% nebulizer solution Take 3 mLs (2.5 mg total) by nebulization every 4 (four) hours as needed for wheezing or shortness of breath. 09/14/18   Sharee Holster, NP  allopurinol (ZYLOPRIM) 100 MG tablet Take 0.5 tablets (50 mg total) by mouth every other day. 09/14/18   Sharee Holster, NP  atorvastatin (LIPITOR) 20 MG tablet Take 1 tablet (20 mg total) by mouth at bedtime. 09/14/18   Sharee Holster, NP  budesonide (PULMICORT) 0.5 MG/2ML nebulizer solution Take 2 mLs (0.5 mg total) by nebulization 2 (two) times daily. 09/14/18   Sharee Holster, NP  Cholecalciferol 100 MCG (4000 UT) CAPS Take 1 capsule by mouth daily. 08/31/18   [provider]  ferrous sulfate 325 (65 FE) MG EC tablet Take 1 tablet (325 mg total) by mouth 3 (three) times daily with meals. 09/14/18   Sharee Holster, NP  furosemide (LASIX) 40 MG tablet Take 1 tablet (40 mg total) by mouth at bedtime. 09/14/18   Sharee Holster, NP  HYDROcodone-acetaminophen (NORCO/VICODIN) 5-325 MG tablet Take 1  tablet by mouth every 6 (six) hours as needed for severe pain. 10/21/18   Anson Oregon, PA-C  [facility] EX) Apply liberal amount topically as needed to area of skin irritation.  Ok to leave at bedside 09/02/18   [provider]  ipratropium-albuterol (DUONEB) 0.5-2.5 (3) MG/3ML SOLN Take 3 mLs by nebulization 2 (two) times daily. 09/14/18   Sharee Holster, NP  loperamide (IMODIUM A-D) 2 MG tablet Take 2 mg by mouth 4 (four) times daily as needed for diarrhea or loose stools. 09/07/18   [provider]  losartan (COZAAR) 25 MG tablet Take 1 tablet (25 mg total) by mouth at bedtime. 09/14/18   Sharee Holster, NP  metoprolol succinate (TOPROL-XL) 100 MG 24 hr  tablet Take 1 tablet (100 mg total) by mouth at bedtime. 09/14/18   Sharee Holster, NP  mirtazapine (REMERON) 15 MG tablet Take 15 mg by mouth at bedtime.    [provider]  multivitamin-lutein (OCUVITE-LUTEIN) CAPS capsule Take 1 capsule by mouth daily.    [provider]  NON FORMULARY Diet Type: NAS. NCS    [provider]  NOVOLOG MIX 70/30 FLEXPEN (70-30) 100 UNIT/ML FlexPen Inject 0.25 mLs (25 Units total) into the skin daily. Patient taking differently: Inject 25 Units into the skin 2 (two) times daily with a meal.  09/14/18   Sharee Holster, NP  nystatin (MYCOSTATIN/NYSTOP) powder Apply topically as directed. Apply with each ostomy change as instructed    [provider]  omeprazole (PRILOSEC) 40 MG capsule Take 1 capsule (40 mg total) by mouth daily. 09/14/18   Sharee Holster, NP  predniSONE (DELTASONE) 10 MG tablet Label  & dispense according to the schedule below. 5 Pills PO for 1 day then, 4 Pills PO for 1 day, 3 Pills PO for 1 day, 2 Pills PO for 1 day, 1 Pill PO for 1 days then STOP. 10/10/18   Houston Siren, MD  sertraline (ZOLOFT) 100 MG tablet Take 1 tablet (100 mg total) by mouth at bedtime. 09/14/18   Sharee Holster, NP  tiotropium  (SPIRIVA) 18 MCG inhalation capsule Place 18 mcg into inhaler and inhale daily. 08/30/18   [provider]  traMADol (ULTRAM) 50 MG tablet Take 1 tablet (50 mg total) by mouth every 6 (six) hours as needed for moderate pain. 09/14/18   Sharee Holster, NP  XARELTO 15 MG TABS tablet Take 1 tablet (15 mg total) by mouth at bedtime. 09/14/18   Sharee Holster, NP      VITAL SIGNS:  Blood pressure (!) 152/49, pulse 88, temperature 97.9 F (36.6 C), resp. rate 17, height 5\' 9"  (1.753 m), weight 99.8 kg, SpO2 98 %.  PHYSICAL EXAMINATION:  Physical Exam  GENERAL:  85 y.o.-year-old patient lying in the bed with no acute distress.  EYES: Pupils equal, round, reactive to light and accommodation. No scleral icterus. Extraocular muscles intact. + Conjunctival pallor. HEENT: Head atraumatic, normocephalic. Oropharynx and nasopharynx clear.  NECK:  Supple, no jugular venous distention. No thyroid enlargement, no tenderness.  LUNGS: + Rhonchi throughout all lung fields. No use of accessory muscles of respiration.  CARDIOVASCULAR: RRR, S1, S2 normal. No murmurs, rubs, or gallops.  ABDOMEN: Soft, nontender, nondistended. Bowel sounds present. No organomegaly or mass. + Urostomy bag in place EXTREMITIES: No pedal edema, cyanosis, or clubbing.  NEUROLOGIC: Cranial nerves II through XII are intact. + Global weakness. Sensation intact. Gait not checked.  PSYCHIATRIC: The patient is alert and oriented x 3.  SKIN: No obvious rash, lesion, or ulcer.   LABORATORY PANEL:   CBC Recent Labs  Lab 02/07/19 2154  WBC 16.1*  HGB 5.8*  HCT 21.8*  PLT 221   ------------------------------------------------------------------------------------------------------------------  Chemistries  Recent Labs  Lab 02/07/19 2154  NA 141  K 4.8  CL 99  CO2 30  GLUCOSE 178*  BUN 53*  CREATININE 2.16*  CALCIUM 9.2    ------------------------------------------------------------------------------------------------------------------  Cardiac Enzymes No results for input(s): TROPONINI in the last 168 hours. ------------------------------------------------------------------------------------------------------------------  RADIOLOGY:  No results found.    IMPRESSION AND PLAN:   GI bleed- in the setting of Xarelto use.  May be hemorrhoidal bleed versus diverticular bleed. Patient is unsure when his last colonoscopy  was. -Holding anticoagulation -GI consult  Acute blood loss anemia- due to above.  Hemoglobin 5.8 -Will order 2 units PRBCs -Check post-transfusion H&H  AKI- likely due to dehydration and poor p.o. intake. -IV fluids -Holding home losartan and Lasix -Avoid nephrotoxic agents -Recheck creatinine in the morning  Leukocytosis- patient endorsing cough, but no shortness of breath.  Has not left his house, so low suspicion for COVID. -Will check chest x-ray, UA, procalcitonin, RVP  Hypertension-blood pressures mildly elevated in the ED -Holding home losartan -Continue home metoprolol -IV hydralazine PRN  Type 2 diabetes- blood sugars elevated in the ED -SSI  Generalized weakness- patient has been unable to care for himself at home since his recent femur fracture. -PT consult -Plan for likely discharge to SNF  All the records are reviewed and case discussed with ED provider. Management plans discussed with the patient, family and they are in agreement.  CODE STATUS: Full  TOTAL TIME TAKING CARE OF THIS PATIENT: 45 minutes.    Jinny Blossom  M.D on 02/07/2019 at 10:49 PM  Between 7am to 6pm - Pager - [phone]  After 6pm go to [email or web address] - Social research officer, government  [facility]  Hospitalists  Office  [phone]  CC: Primary care physician; Marguarite Arbour, MD   Note: This dictation was prepared with Dragon dictation along with smaller phrase technology. Any  transcriptional errors that result from this process are unintentional.

## 2019-02-07 NOTE — ED Provider Notes (Signed)
[facility] Emergency Department Provider Note ____________________________________________   First MD Initiated Contact with Patient 02/07/19 2157     (approximate)  I have reviewed the triage vital signs and the nursing notes.   HISTORY  Chief Complaint Abnormal Lab    HPI Gary Thomas is a 85 y.o. male with PMH as noted below who presents with anemia, unknown onset, and determined on blood work obtained at his doctor's office today.  The patient reports associated fatigue and generalized weakness which has been gradual over the last few weeks.  He has no history of significant anemia requiring blood transfusions in the past.  He denies any abnormal bleeding or bruising, and denies dark or tarry stools.  The patient states that he has had some persistent pain after a recent femur fracture but no swelling there.   Past Medical History:  Diagnosis Date  . Depression   . Diabetes mellitus without complication (HCC)   . Dyspnea   . Elevated lipids   . Femur fracture (HCC) 08/22/2018  . GERD (gastroesophageal reflux disease)   . Hypertension   . Pulmonary emboli (HCC)   . Renal insufficiency    only has 1 kidney  . Sleep apnea   . Stroke (HCC)    Rt side weakness    Patient Active Problem List   Diagnosis Date Noted  . Postoperative wound infection 10/17/2018  . Supracondylar fracture of distal end of femur without intracondylar extension (HCC) 10/17/2018  . Sepsis (HCC) 10/06/2018  . Chronic pulmonary embolism (HCC) 09/06/2018  . Primary osteoarthritis of right knee 09/06/2018  . Status post right knee replacement 09/06/2018  . Dyslipidemia associated with type 2 diabetes mellitus (HCC) 09/06/2018  . CKD stage 3 due to type 2 diabetes mellitus (HCC) 09/06/2018  . Controlled type 2 diabetes mellitus with stage 3 chronic kidney disease, with long-term current use of insulin (HCC) 09/06/2018  . Chronic cerebrovascular accident (CVA) 09/06/2018   . GERD without esophagitis 09/06/2018  . Closed nondisplaced supracondylar fracture of distal end of femur without intracondylar extension (HCC) 09/06/2018  . Acute on chronic anemia 09/06/2018  . Depression, major, single episode, in partial remission (HCC) 09/06/2018  . Bilateral lower extremity edema 09/06/2018  . Chronic gout due to renal impairment 09/06/2018  . COPD with respiratory failure, acute (HCC) 08/28/2018  . Spondylolisthesis of lumbar region 02/15/2018  . PAD (peripheral artery disease) (HCC) 08/19/2017  . Hyperlipidemia 07/05/2017  . Essential hypertension 07/05/2017  . Bilateral carotid artery stenosis 07/05/2017  . Primary localized osteoarthritis of right hip 03/02/2017    Past Surgical History:  Procedure Laterality Date  . 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 AORTA SURGERY    . CAROTID ARTERY - SUBCLAVIAN ARTERY BYPASS GRAFT Bilateral   . CATARACT EXTRACTION W/ INTRAOCULAR LENS  IMPLANT, BILATERAL    . CHOLECYSTECTOMY    . EYE SURGERY    . FEMUR FRACTURE SURGERY Right   . HIP FRACTURE SURGERY Bilateral   . I&D EXTREMITY Right 10/18/2018   Procedure: IRRIGATION AND DEBRIDEMENT EXTREMITY, POSSIBLE HARDWARE REMOVAL OF SUPERCONDYLAR PLATE;  Surgeon: Christena Flake, MD;  Location: [facility] ORS;  Service: Orthopedics;  Laterality: Right;  . JOINT REPLACEMENT    . ORIF FEMUR FRACTURE Right 08/24/2018   Procedure: OPEN REDUCTION INTERNAL FIXATION (ORIF) SUPRACONDYLAR FEMUR FRACTURE;  Surgeon: Christena Flake, MD;  Location: [facility] ORS;  Service: Orthopedics;  Laterality: Right;  . ORIF FEMUR FRACTURE Right  10/18/2018   Procedure: HARDWARE REMOVAL- DISTAL FEMUR FRACTURE FIXATION;  Surgeon: Christena FlakePoggi, John J, MD;  Location: [facility] ORS;  Service: Orthopedics;  Laterality: Right;  . PROSTATE SURGERY    . REPLACEMENT TOTAL KNEE Right   . TOTAL HIP ARTHROPLASTY Right 03/02/2017   Procedure: TOTAL HIP ARTHROPLASTY ANTERIOR APPROACH;  Surgeon: Kennedy BuckerMenz,  Michael, MD;  Location: [facility] ORS;  Service: Orthopedics;  Laterality: Right;  Marland Kitchen. VASCULAR SURGERY      Prior to Admission medications   Medication Sig Start Date End Date Taking? Authorizing Provider  acetaminophen (TYLENOL) 325 MG tablet Take 2 tablets (650 mg total) by mouth every 6 (six) hours as needed for mild pain (or Fever >/= 101). 08/30/18   Gouru, Deanna ArtisAruna, MD  albuterol (PROVENTIL) (2.5 MG/3ML) 0.083% nebulizer solution Take 3 mLs (2.5 mg total) by nebulization every 4 (four) hours as needed for wheezing or shortness of breath. 09/14/18   Sharee HolsterGreen, Deborah S, NP  allopurinol (ZYLOPRIM) 100 MG tablet Take 0.5 tablets (50 mg total) by mouth every other day. 09/14/18   Sharee HolsterGreen, Deborah S, NP  atorvastatin (LIPITOR) 20 MG tablet Take 1 tablet (20 mg total) by mouth at bedtime. 09/14/18   Sharee HolsterGreen, Deborah S, NP  budesonide (PULMICORT) 0.5 MG/2ML nebulizer solution Take 2 mLs (0.5 mg total) by nebulization 2 (two) times daily. 09/14/18   Sharee HolsterGreen, Deborah S, NP  Cholecalciferol 100 MCG (4000 UT) CAPS Take 1 capsule by mouth daily. 08/31/18   [provider]  ferrous sulfate 325 (65 FE) MG EC tablet Take 1 tablet (325 mg total) by mouth 3 (three) times daily with meals. 09/14/18   Sharee HolsterGreen, Deborah S, NP  furosemide (LASIX) 40 MG tablet Take 1 tablet (40 mg total) by mouth at bedtime. 09/14/18   Sharee HolsterGreen, Deborah S, NP  HYDROcodone-acetaminophen (NORCO/VICODIN) 5-325 MG tablet Take 1 tablet by mouth every 6 (six) hours as needed for severe pain. 10/21/18   Anson OregonMcGhee, James Lance, PA-C  Infant Care Products [facility](DERMACLOUD EX) Apply liberal amount topically as needed to area of skin irritation.  Ok to leave at bedside 09/02/18   [provider]  ipratropium-albuterol (DUONEB) 0.5-2.5 (3) MG/3ML SOLN Take 3 mLs by nebulization 2 (two) times daily. 09/14/18   Sharee HolsterGreen, Deborah S, NP  loperamide (IMODIUM A-D) 2 MG tablet Take 2 mg by mouth 4 (four) times daily as needed for diarrhea or loose stools. 09/07/18    [provider]  losartan (COZAAR) 25 MG tablet Take 1 tablet (25 mg total) by mouth at bedtime. 09/14/18   Sharee HolsterGreen, Deborah S, NP  metoprolol succinate (TOPROL-XL) 100 MG 24 hr tablet Take 1 tablet (100 mg total) by mouth at bedtime. 09/14/18   Sharee HolsterGreen, Deborah S, NP  mirtazapine (REMERON) 15 MG tablet Take 15 mg by mouth at bedtime.    [provider]  multivitamin-lutein (OCUVITE-LUTEIN) CAPS capsule Take 1 capsule by mouth daily.    [provider]  NON FORMULARY Diet Type: NAS. NCS    [provider]  NOVOLOG MIX 70/30 FLEXPEN (70-30) 100 UNIT/ML FlexPen Inject 0.25 mLs (25 Units total) into the skin daily. Patient taking differently: Inject 25 Units into the skin 2 (two) times daily with a meal.  09/14/18   Sharee HolsterGreen, Deborah S, NP  nystatin (MYCOSTATIN/NYSTOP) powder Apply topically as directed. Apply with each ostomy change as instructed    [provider]  omeprazole (PRILOSEC) 40 MG capsule Take 1 capsule (40 mg total) by mouth daily. 09/14/18   Sharee HolsterGreen, Deborah S, NP  predniSONE (DELTASONE) 10 MG tablet Label  & dispense according to the schedule below. 5 Pills PO for 1 day then, 4 Pills PO for 1 day, 3 Pills PO for 1 day, 2 Pills PO for 1 day, 1 Pill PO for 1 days then STOP. 10/10/18   Houston Siren, MD  sertraline (ZOLOFT) 100 MG tablet Take 1 tablet (100 mg total) by mouth at bedtime. 09/14/18   Sharee Holster, NP  tiotropium (SPIRIVA) 18 MCG inhalation capsule Place 18 mcg into inhaler and inhale daily. 08/30/18   [provider]  traMADol (ULTRAM) 50 MG tablet Take 1 tablet (50 mg total) by mouth every 6 (six) hours as needed for moderate pain. 09/14/18   Sharee Holster, NP  XARELTO 15 MG TABS tablet Take 1 tablet (15 mg total) by mouth at bedtime. 09/14/18   Sharee Holster, NP    Allergies Codeine; Morphine; Gabapentin; Stadol [butorphanol]; Lyrica [pregabalin]; Tapentadol; and Tramadol  Family History  Problem Relation Age of Onset   . Heart attack Father   . Stroke Father   . Heart disease Father   . Heart attack Mother     Social History Social History   Tobacco Use  . Smoking status: Former Smoker    Packs/day: 1.00    Years: 7.00    Pack years: 7.00    Types: Cigarettes    Last attempt to quit: 02/18/1952    Years since quitting: 67.0  . Smokeless tobacco: Former Engineer, water Use Topics  . Alcohol use: No  . Drug use: No    Review of Systems  Constitutional: No fever.  Positive for fatigue. Eyes: No redness. ENT: No sore throat. Cardiovascular: Denies chest pain. Respiratory: Denies shortness of breath. Gastrointestinal: No gastrointestinal bleeding. Genitourinary: Negative for dysuria or hematuria.  Musculoskeletal: Negative for back pain. Skin: Negative for rash. Neurological: Negative for headache.   ____________________________________________   PHYSICAL EXAM:  VITAL SIGNS: ED Triage Vitals  Enc Vitals Group     BP 02/07/19 2150 (!) 152/49     Pulse Rate 02/07/19 2150 88     Resp 02/07/19 2150 17     Temp 02/07/19 2150 97.9 F (36.6 C)     Temp src --      SpO2 02/07/19 2150 98 %     Weight 02/07/19 2147 220 lb (99.8 kg)     Height 02/07/19 2147  (1.753 m)     Head Circumference --      Peak Flow --      Pain Score 02/07/19 2147 0     Pain Loc --      Pain Edu? --      Excl. in GC? --     Constitutional: Alert and oriented.  Weak appearing but in no acute distress. Eyes: Conjunctivae are pale. Head: Atraumatic. Nose: No congestion/rhinnorhea. Mouth/Throat: Mucous membranes are moist.   Neck: Normal range of motion.  Cardiovascular: Normal rate, regular rhythm. Good peripheral circulation. Respiratory: Normal respiratory effort.  Gastrointestinal: Soft and nontender. No distention.  Genitourinary: No flank tenderness. Musculoskeletal: Extremities warm and well perfused.  Neurologic:  Normal speech and language. No gross focal neurologic deficits are  appreciated.  Skin:  Skin is warm and dry. No rash noted.  Pale appearing. Psychiatric: Mood and affect are normal. Speech and behavior are normal.  ____________________________________________   LABS (all labs ordered are listed, but only abnormal results are displayed)  Labs Reviewed  BASIC METABOLIC PANEL - Abnormal;  Notable for the following components:      Result Value   Glucose, Bld 178 (*)    BUN 53 (*)    Creatinine, Ser 2.16 (*)    GFR calc non Af Amer 27 (*)    GFR calc Af Amer 31 (*)    All other components within normal limits  CBC WITH DIFFERENTIAL/PLATELET - Abnormal; Notable for the following components:   WBC 16.1 (*)    RBC 2.80 (*)    Hemoglobin 5.8 (*)    HCT 21.8 (*)    MCV 77.9 (*)    MCH 20.7 (*)    MCHC 26.6 (*)    RDW 16.6 (*)    Neutro Abs 13.3 (*)    Monocytes Absolute 1.4 (*)    Abs Immature Granulocytes 0.10 (*)    All other components within normal limits  PROTIME-INR  URINALYSIS, COMPLETE (UACMP) WITH MICROSCOPIC  TYPE AND SCREEN  PREPARE RBC (CROSSMATCH)   ____________________________________________  EKG  ED ECG REPORT I, Dionne Bucy, the attending physician, personally viewed and interpreted this ECG.  Date: 02/07/2019 EKG Time: 2251 Rate: 83 Rhythm: normal sinus rhythm QRS Axis: normal Intervals: normal ST/T Wave abnormalities: normal Narrative Interpretation: no evidence of acute ischemia  ____________________________________________  RADIOLOGY    ____________________________________________   PROCEDURES  Procedure(s) performed: No  Procedures  Critical Care performed: No ____________________________________________   INITIAL IMPRESSION / ASSESSMENT AND PLAN / ED COURSE  Pertinent labs & imaging results that were available during my care of the patient were reviewed by me and considered in my medical decision making (see chart for details).  85 year old male with PMH as noted above presents for  evaluation and treatment of anemia.  He reports generalized fatigue.  He denies any specific source of bleeding.  I reviewed the past medical records in epic.  The patient was evaluated by his PMD Dr. Judithann Sheen today for fatigue and had labs drawn which showed the anemia.  On exam the patient is pale and weak appearing but in no acute distress.  Vital signs are normal except for hypertension.  He is alert.  The remainder of the exam is as described above.  DRE reveals brown stool that is weakly guaiac positive.  At this time, the patient's vital signs are stable.  He will require transfusion and admission given that this is a new problem and he will need some additional work-up and monitoring.  We will obtain labs and then proceed from there.  ----------------------------------------- 10:46 PM on 02/07/2019 -----------------------------------------  CBC confirms hemoglobin of 5.8 which is microcytic.  I have ordered 2 units of PRBCs.  ----------------------------------------- 10:54 PM on 02/07/2019 -----------------------------------------  I signed the patient out to the hospitalist Dr. Nancy Marus. ____________________________________________   FINAL CLINICAL IMPRESSION(S) / ED DIAGNOSES  Final diagnoses:  Symptomatic anemia      NEW MEDICATIONS STARTED DURING THIS VISIT:  New Prescriptions   No medications on file     Note:  This document was prepared using Dragon voice recognition software and may include unintentional dictation errors.    Dionne Bucy, MD 02/07/19 [id number]

## 2019-02-07 NOTE — ED Notes (Signed)
ED TO INPATIENT HANDOFF REPORT  ED Nurse Name and Phone #: Levander Campion Name/Age/Gender Gary Thomas 85 y.o. male Room/Bed: ED15A/ED15A  Code Status   Code Status: Prior  Home/SNF/Other Home Patient oriented to: situation Is this baseline? Yes   Triage Complete: Triage complete  Chief Complaint Anemia  Triage Note Pt was told to come to the ED to receive to blood transfusion due to a low Hbg. Pt stated that he feels week.   Allergies Allergies  Allergen Reactions  . Codeine Anaphylaxis  . Morphine Anaphylaxis  . Gabapentin Other (See Comments)    Unsure of what the reaction was   . Stadol [Butorphanol]     Unknown  . Lyrica [Pregabalin] Rash  . Tapentadol Nausea Only    ONSET 01/04/2013  . Tramadol     Hallucinations "I see giant spiders on ceiling". Pt also believes he is in [facility], but he is in [place].    Level of Care/Admitting Diagnosis ED Disposition    ED Disposition Condition Comment   Admit  Hospital Area: [facility] [[id number]]  Level of Care: Med-Surg [16]  Covid Evaluation: N/A  Diagnosis: GI bleed [222979]  Admitting Physician: Willadean Carol DODD [[id number]]  Attending Physician: Willadean Carol DODD [[id number]]  Estimated length of stay: past midnight tomorrow  Certification:: I certify this patient will need inpatient services for at least 2 midnights  PT Class (Do Not Modify): Inpatient [101]  PT Acc Code (Do Not Modify): Private [1]       B Medical/Surgery History Past Medical History:  Diagnosis Date  . Depression   . Diabetes mellitus without complication (HCC)   . Dyspnea   . Elevated lipids   . Femur fracture (HCC) 08/22/2018  . GERD (gastroesophageal reflux disease)   . Hypertension   . Pulmonary emboli (HCC)   . Renal insufficiency    only has 1 kidney  . Sleep apnea   . Stroke (HCC)    Rt side weakness   Past Surgical History:  Procedure Laterality Date  . ADRENAL GLAND SURGERY Left    left adrenalectomy  due to pheochromocytoma  . AORTA SURGERY    . CAROTID ARTERY - SUBCLAVIAN ARTERY BYPASS GRAFT Bilateral   . CATARACT EXTRACTION W/ INTRAOCULAR LENS  IMPLANT, BILATERAL    . CHOLECYSTECTOMY    . EYE SURGERY    . FEMUR FRACTURE SURGERY Right   . HIP FRACTURE SURGERY Bilateral   . I&D EXTREMITY Right 10/18/2018   Procedure: IRRIGATION AND DEBRIDEMENT EXTREMITY, POSSIBLE HARDWARE REMOVAL OF SUPERCONDYLAR PLATE;  Surgeon: Christena Flake, MD;  Location: [facility] ORS;  Service: Orthopedics;  Laterality: Right;  . JOINT REPLACEMENT    . ORIF FEMUR FRACTURE Right 08/24/2018   Procedure: OPEN REDUCTION INTERNAL FIXATION (ORIF) SUPRACONDYLAR FEMUR FRACTURE;  Surgeon: Christena Flake, MD;  Location: [facility];  Service: Orthopedics;  Laterality: Right;  . ORIF FEMUR FRACTURE Right 10/18/2018   Procedure: HARDWARE REMOVAL- DISTAL FEMUR FRACTURE FIXATION;  Surgeon: Christena Flake, MD;  Location: [facility] OR[facility];  Service: Orthopedics;  Laterality: Right;  . PROSTATE SURGERY    . REPLACEMENT TOTAL KNEE Right   . TOTAL HIP ARTHROPLASTY Right 03/02/2017   Procedure: TOTAL HIP ARTHROPLASTY ANTERIOR APPROACH;  Surgeon: Kennedy Bucker, MD;  Location: [facility] OR[facility];  Service: Orthopedics;  Laterality: Right;  Marland Kitchen VASCULAR SURGERY       A IV Location/Drains/Wounds Patient Lines/Drains/Airways Status   Active Line/Drains/Airways    Name:   Placement date:  Placement time:   Site:   Days:   Peripheral IV 02/07/19 Left Wrist   02/07/19    2157    Wrist   less than 1   Negative Pressure Wound Therapy Leg Right;Lateral   10/18/18    1656    -   112   Urostomy Ureterostomy right RLQ   02/17/18    0800    RLQ   355   Incision (Closed) 03/02/17 Hip Right   03/02/17    1603     707   Incision (Closed) 02/15/18 Back   02/15/18    1556     357   Incision (Closed) 08/24/18 Leg Right   08/24/18    1743     167   Incision (Closed) 10/07/18 Hip Right;Lateral   10/07/18    0754     123   Incision (Closed) 10/18/18 Leg Right   10/18/18    1546      112          Intake/Output Last 24 hours No intake or output data in the 24 hours ending 02/07/19 2334  Labs/Imaging Results for orders placed or performed during the hospital encounter of 02/07/19 (from the past 48 hour(s))  Basic metabolic panel     Status: Abnormal   Collection Time: 02/07/19  9:54 PM  Result Value Ref Range   Sodium 141 135 - 145 mmol/L   Potassium 4.8 3.5 - 5.1 mmol/L   Chloride 99 98 - 111 mmol/L   CO2 30 22 - 32 mmol/L   Glucose, Bld 178 (H) 70 - 99 mg/dL   BUN 53 (H) 8 - 23 mg/dL   Creatinine, Ser 1.61 (H) 0.61 - 1.24 mg/dL   Calcium 9.2 8.9 - 09.6 mg/dL   GFR calc non Af Amer 27 (L) >60 mL/min   GFR calc Af Amer 31 (L) >60 mL/min   Anion gap 12 5 - 15    Comment: Performed at [facility], [address] [place], [place] [address]  CBC with Differential     Status: Abnormal   Collection Time: 02/07/19  9:54 PM  Result Value Ref Range   WBC 16.1 (H) 4.0 - 10.5 K/uL   RBC 2.80 (L) 4.22 - 5.81 MIL/uL   Hemoglobin 5.8 (L) 13.0 - 17.0 g/dL    Comment: Reticulocyte Hemoglobin testing may be clinically indicated, consider ordering this additional test [id number]    HCT 21.8 (L) 39.0 - 52.0 %   MCV 77.9 (L) 80.0 - 100.0 fL   MCH 20.7 (L) 26.0 - 34.0 pg   MCHC 26.6 (L) 30.0 - 36.0 g/dL   RDW 82.9 (H) 56.2 - 13.0 %   Platelets 221 150 - 400 K/uL   nRBC 0.1 0.0 - 0.2 %   Neutrophils Relative % 83 %   Neutro Abs 13.3 (H) 1.7 - 7.7 K/uL   Lymphocytes Relative 7 %   Lymphs Abs 1.2 0.7 - 4.0 K/uL   Monocytes Relative 8 %   Monocytes Absolute 1.4 (H) 0.1 - 1.0 K/uL   Eosinophils Relative 1 %   Eosinophils Absolute 0.2 0.0 - 0.5 K/uL   Basophils Relative 0 %   Basophils Absolute 0.0 0.0 - 0.1 K/uL   Immature Granulocytes 1 %   Abs Immature Granulocytes 0.10 (H) 0.00 - 0.07 K/uL    Comment: Performed at [facility], [address] [place], [place] [address]  Protime-INR     Status: None   Collection  Time: 02/07/19  9:54 PM   Result Value Ref Range   Prothrombin Time 14.2 11.4 - 15.2 seconds   INR 1.1 0.8 - 1.2    Comment: (NOTE) INR goal varies based on device and disease states. Performed at [facility], [address] [place], [place] [address]   Type and screen [facility]     Status: None (Preliminary result)   Collection Time: 02/07/19  9:54 PM  Result Value Ref Range   ABO/RH(D) O POS    Antibody Screen NEG    Sample Expiration      02/10/2019 Performed at [facility], [address] [place], [place] [address]    Unit Number [id number]    Blood Component Type RED CELLS,LR    Unit division 00    Status of Unit ALLOCATED    Transfusion Status OK TO TRANSFUSE    Crossmatch Result Compatible    Unit Number [id number]    Blood Component Type RBC, LR IRR    Unit division 00    Status of Unit ALLOCATED    Transfusion Status OK TO TRANSFUSE    Crossmatch Result Compatible   Prepare RBC     Status: None   Collection Time: 02/07/19 11:00 PM  Result Value Ref Range   Order Confirmation      ORDER PROCESSED BY BLOOD BANK Performed at [facility], [address] [place], [place] [address]    No results found.  Pending Labs Unresulted Labs (From admission, onward)    Start     Ordered   02/07/19 2247  Urinalysis, Complete w Microscopic  ONCE - STAT,   STAT     02/07/19 2247   Signed and Held  Basic metabolic panel  Tomorrow morning,   R     Signed and Held   Signed and Held  CBC  Tomorrow morning,   R     Signed and Held   Signed and Held  Respiratory Panel by PCR  (Respiratory virus panel with precautions)  Once,   R     Signed and Held   Signed and Held  Procalcitonin - Baseline  ONCE - STAT,   STAT     Signed and Held          Vitals/Pain Today's Vitals   02/07/19 2147 02/07/19 2150  BP:  (!) 152/49  Pulse:  88  Resp:  17  Temp:  97.9 F (36.6 C)  SpO2:  98%  Weight: 99.8 kg   Height: 5\' 9"  (1.753 m)   PainSc:  0-No pain     Isolation Precautions No active isolations  Medications Medications  0.9 %  sodium chloride infusion (Manually program via Guardrails IV Fluids) (has no administration in time range)    Mobility non-ambulatory Low fall risk   Focused Assessments    R Recommendations: See Admitting Provider Note  Report given to:   Additional Notes:

## 2019-02-07 NOTE — Progress Notes (Signed)
Family Meeting Note  Advance Directive:yes  Today a meeting took place with the Patient.  Patient is able to participate.  The following clinical team members were present during this meeting:MD  The following were discussed:Patient's diagnosis: GI bleed, Patient's progosis: Unable to determine and Goals for treatment: Full Code  Additional follow-up to be provided: prn  Time spent during discussion:20 minutes  Gary Thomas D Merryl Buckels, MD  

## 2019-02-08 ENCOUNTER — Inpatient Hospital Stay: Payer: Medicare Other

## 2019-02-08 DIAGNOSIS — D508 Other iron deficiency anemias: Secondary | ICD-10-CM

## 2019-02-08 LAB — BASIC METABOLIC PANEL
Anion gap: 10 (ref 5–15)
BUN: 48 mg/dL — ABNORMAL HIGH (ref 8–23)
CO2: 30 mmol/L (ref 22–32)
Calcium: 8.5 mg/dL — ABNORMAL LOW (ref 8.9–10.3)
Chloride: 100 mmol/L (ref 98–111)
Creatinine, Ser: 1.91 mg/dL — ABNORMAL HIGH (ref 0.61–1.24)
GFR calc Af Amer: 36 mL/min — ABNORMAL LOW (ref >60)
GFR calc non Af Amer: 31 mL/min — ABNORMAL LOW (ref >60)
Glucose, Bld: 136 mg/dL — ABNORMAL HIGH (ref 70–99)
Potassium: 4.4 mmol/L (ref 3.5–5.1)
Sodium: 140 mmol/L (ref 135–145)

## 2019-02-08 LAB — URINALYSIS, COMPLETE (UACMP) WITH MICROSCOPIC
Bilirubin Urine: NEGATIVE
Glucose, UA: NEGATIVE mg/dL
Hgb urine dipstick: NEGATIVE
Ketones, ur: NEGATIVE mg/dL
Nitrite: NEGATIVE
Protein, ur: 30 mg/dL — AB
Specific Gravity, Urine: 1.015 (ref 1.005–1.030)
pH: 8 (ref 5.0–8.0)

## 2019-02-08 LAB — IRON AND TIBC
Iron: 170 ug/dL (ref 45–182)
Saturation Ratios: 51 % — ABNORMAL HIGH (ref 17.9–39.5)
TIBC: 333 ug/dL (ref 250–450)
UIBC: 164 ug/dL

## 2019-02-08 LAB — CBC
HCT: 23.8 % — ABNORMAL LOW (ref 39.0–52.0)
Hemoglobin: 7.2 g/dL — ABNORMAL LOW (ref 13.0–17.0)
MCH: 23.6 pg — ABNORMAL LOW (ref 26.0–34.0)
MCHC: 30.3 g/dL (ref 30.0–36.0)
MCV: 78 fL — ABNORMAL LOW (ref 80.0–100.0)
Platelets: 164 10*3/uL (ref 150–400)
RBC: 3.05 MIL/uL — ABNORMAL LOW (ref 4.22–5.81)
RDW: 16 % — ABNORMAL HIGH (ref 11.5–15.5)
WBC: 8.5 10*3/uL (ref 4.0–10.5)
nRBC: 0.6 % — ABNORMAL HIGH (ref 0.0–0.2)

## 2019-02-08 LAB — FERRITIN: Ferritin: 11 ng/mL — ABNORMAL LOW (ref 24–336)

## 2019-02-08 LAB — MRSA PCR SCREENING: MRSA by PCR: POSITIVE — AB

## 2019-02-08 LAB — RESPIRATORY PANEL BY PCR

## 2019-02-08 LAB — GLUCOSE, CAPILLARY
Glucose-Capillary: 86 mg/dL (ref 70–99)
Glucose-Capillary: 110 mg/dL — ABNORMAL HIGH (ref 70–99)
Glucose-Capillary: 175 mg/dL — ABNORMAL HIGH (ref 70–99)
Glucose-Capillary: 104 mg/dL — ABNORMAL HIGH (ref 70–99)

## 2019-02-08 LAB — PROCALCITONIN: Procalcitonin: <0.1 ng/mL

## 2019-02-08 LAB — VITAMIN B12: Vitamin B-12: 491 pg/mL (ref 180–914)

## 2019-02-08 LAB — PREPARE RBC (CROSSMATCH)

## 2019-02-08 LAB — FOLATE: Folate: 17.6 ng/mL (ref >5.9)

## 2019-02-08 MED ORDER — FERROUS SULFATE 325 (65 FE) MG PO TABS
325.0000 mg | ORAL_TABLET | Freq: Three times a day (TID) | ORAL | Status: DC
  Administered 2019-02-08: 09:00:00 325 mg via ORAL
  Filled 2019-02-08: qty 1

## 2019-02-08 MED ORDER — ONDANSETRON HCL 4 MG/2ML IJ SOLN: 4.0000 mg | Freq: Four times a day (QID) | INTRAMUSCULAR | Status: DC | PRN

## 2019-02-08 MED ORDER — BUDESONIDE 0.5 MG/2ML IN SUSP: 0.5000 mg | Freq: Two times a day (BID) | RESPIRATORY_TRACT | Status: DC

## 2019-02-08 MED ORDER — METOPROLOL SUCCINATE ER 50 MG PO TB24
100.0000 mg | ORAL_TABLET | Freq: Every day | ORAL | Status: DC
  Administered 2019-02-08 – 2019-02-09 (×2): 100 mg via ORAL
  Filled 2019-02-08 (×2): qty 2

## 2019-02-08 MED ORDER — IPRATROPIUM-ALBUTEROL 0.5-2.5 (3) MG/3ML IN SOLN
3.0000 mL | Freq: Two times a day (BID) | RESPIRATORY_TRACT | Status: DC
  Administered 2019-02-08 – 2019-02-10 (×5): 3 mL via RESPIRATORY_TRACT
  Filled 2019-02-08 (×5): qty 3

## 2019-02-08 MED ORDER — ACETAMINOPHEN 650 MG RE SUPP: 650.0000 mg | Freq: Four times a day (QID) | RECTAL | Status: DC | PRN

## 2019-02-08 MED ORDER — IPRATROPIUM-ALBUTEROL 0.5-2.5 (3) MG/3ML IN SOLN: 3.0000 mL | Freq: Two times a day (BID) | RESPIRATORY_TRACT | Status: DC

## 2019-02-08 MED ORDER — INSULIN ASPART 100 UNIT/ML ~~LOC~~ SOLN: 0.0000 [IU] | Freq: Every day | SUBCUTANEOUS | Status: DC

## 2019-02-08 MED ORDER — SODIUM CHLORIDE 0.9 % IV SOLN
300.0000 mg | Freq: Once | INTRAVENOUS | Status: AC
  Administered 2019-02-08: 300 mg via INTRAVENOUS
  Filled 2019-02-08: qty 15

## 2019-02-08 MED ORDER — ALLOPURINOL 100 MG PO TABS
50.0000 mg | ORAL_TABLET | ORAL | Status: DC
  Administered 2019-02-08 – 2019-02-10 (×2): 50 mg via ORAL
  Filled 2019-02-08 (×2): qty 1

## 2019-02-08 MED ORDER — ATORVASTATIN CALCIUM 20 MG PO TABS
20.0000 mg | ORAL_TABLET | Freq: Every day | ORAL | Status: DC
  Administered 2019-02-08 – 2019-02-09 (×2): 20 mg via ORAL
  Filled 2019-02-08 (×2): qty 1

## 2019-02-08 MED ORDER — SODIUM CHLORIDE 0.9% IV SOLUTION
Freq: Once | INTRAVENOUS | Status: AC
  Administered 2019-02-08: 15:00:00 via INTRAVENOUS

## 2019-02-08 MED ORDER — MUPIROCIN 2 % EX OINT
TOPICAL_OINTMENT | Freq: Two times a day (BID) | CUTANEOUS | Status: DC
  Administered 2019-02-08 – 2019-02-10 (×5): via NASAL
  Filled 2019-02-08: qty 22

## 2019-02-08 MED ORDER — GUAIFENESIN-DM 100-10 MG/5ML PO SYRP: 5.0000 mL | ORAL_SOLUTION | ORAL | Status: DC | PRN

## 2019-02-08 MED ORDER — SERTRALINE HCL 50 MG PO TABS
100.0000 mg | ORAL_TABLET | Freq: Every day | ORAL | Status: DC
  Administered 2019-02-08 – 2019-02-09 (×2): 100 mg via ORAL
  Filled 2019-02-08 (×2): qty 2

## 2019-02-08 MED ORDER — ONDANSETRON HCL 4 MG PO TABS: 4.0000 mg | ORAL_TABLET | Freq: Four times a day (QID) | ORAL | Status: DC | PRN

## 2019-02-08 MED ORDER — BUDESONIDE 0.5 MG/2ML IN SUSP
0.5000 mg | Freq: Two times a day (BID) | RESPIRATORY_TRACT | Status: DC
  Administered 2019-02-08 – 2019-02-10 (×5): 0.5 mg via RESPIRATORY_TRACT
  Filled 2019-02-08 (×5): qty 2

## 2019-02-08 MED ORDER — SODIUM CHLORIDE 0.9 % IV SOLN
510.0000 mg | Freq: Once | INTRAVENOUS | Status: AC
  Administered 2019-02-08: 510 mg via INTRAVENOUS
  Filled 2019-02-08: qty 17

## 2019-02-08 MED ORDER — MIRTAZAPINE 15 MG PO TABS
15.0000 mg | ORAL_TABLET | Freq: Every day | ORAL | Status: DC
  Administered 2019-02-08 – 2019-02-09 (×2): 15 mg via ORAL
  Filled 2019-02-08 (×2): qty 1

## 2019-02-08 MED ORDER — TIOTROPIUM BROMIDE MONOHYDRATE 18 MCG IN CAPS: 18.0000 ug | ORAL_CAPSULE | Freq: Every day | RESPIRATORY_TRACT | Status: DC

## 2019-02-08 MED ORDER — ALBUTEROL SULFATE (2.5 MG/3ML) 0.083% IN NEBU: 2.5000 mg | INHALATION_SOLUTION | RESPIRATORY_TRACT | Status: DC | PRN

## 2019-02-08 MED ORDER — ACETAMINOPHEN 325 MG PO TABS: 650.0000 mg | ORAL_TABLET | Freq: Four times a day (QID) | ORAL | Status: DC | PRN

## 2019-02-08 MED ORDER — INSULIN ASPART 100 UNIT/ML ~~LOC~~ SOLN
0.0000 [IU] | Freq: Three times a day (TID) | SUBCUTANEOUS | Status: DC
  Administered 2019-02-08: 18:00:00 2 [IU] via SUBCUTANEOUS
  Administered 2019-02-09 (×2): 1 [IU] via SUBCUTANEOUS
  Filled 2019-02-08 (×2): qty 1

## 2019-02-08 MED ORDER — SODIUM CHLORIDE 0.9 % IV SOLN
INTRAVENOUS | Status: DC
  Administered 2019-02-08 – 2019-02-09 (×2): via INTRAVENOUS

## 2019-02-08 MED ORDER — HYDRALAZINE HCL 20 MG/ML IJ SOLN: 5.0000 mg | INTRAMUSCULAR | Status: DC | PRN

## 2019-02-08 NOTE — Progress Notes (Signed)
PT Cancellation Note  Patient Details Name: Gary Thomas MRN: [id number] DOB: 1932/05/21   Cancelled Treatment:    Reason Eval/Treat Not Completed: Medical issues which prohibited therapy(Chart reviewed, RN consulted- RN reports patient will be starting blood transfusion shortly. Will evaluate at latr date/time once pt is medically ready. )   2:58 PM, 02/08/19 Rosamaria Lints, PT, DPT Physical Therapist - [facility]  [phone] (ASCOM)   Jalana Moore C 02/08/2019, 2:58 PM

## 2019-02-08 NOTE — Progress Notes (Cosign Needed)
PatienthasCOPD, CKD stage 3,  Is Non ambulatory, and has Diabetes type 2 whichrequiresupper  Body and lower body tobepositionedinwaysnotfeasiblewithanormalbed.Headmustbeelevatedatleast30ormore to aid in breathing, if not able to repostion often the patient could have decubitis ulcers and skin break down as well as Aspiration and difficulty breathing.

## 2019-02-08 NOTE — Progress Notes (Signed)
[employer] - [place] at [facility]   PATIENT NAME: Gary Thomas    MR#:  [id number]  DATE OF BIRTH:  1932-06-19  SUBJECTIVE:  CHIEF COMPLAINT:   Chief Complaint  Patient presents with  . Abnormal Lab   Noted to be anemic, after transfusion feels slightly better. REVIEW OF SYSTEMS:  CONSTITUTIONAL: No fever, have fatigue or weakness.  EYES: No blurred or double vision.  EARS, NOSE, AND THROAT: No tinnitus or ear pain.  RESPIRATORY: No cough, shortness of breath, wheezing or hemoptysis.  CARDIOVASCULAR: No chest pain, orthopnea, edema.  GASTROINTESTINAL: No nausea, vomiting, diarrhea or abdominal pain.  GENITOURINARY: No dysuria, hematuria.  ENDOCRINE: No polyuria, nocturia,  HEMATOLOGY: No anemia, easy bruising or bleeding SKIN: No rash or lesion. MUSCULOSKELETAL: No joint pain or arthritis.   NEUROLOGIC: No tingling, numbness, weakness.  PSYCHIATRY: No anxiety or depression.   ROS  DRUG ALLERGIES:   Allergies  Allergen Reactions  . Codeine Anaphylaxis  . Morphine Anaphylaxis  . Gabapentin Other (See Comments)    Unsure of what the reaction was   . Stadol [Butorphanol]     Unknown  . Lyrica [Pregabalin] Rash  . Tapentadol Nausea Only    ONSET 01/04/2013  . Tramadol     Hallucinations "I see giant spiders on ceiling". Pt also believes he is in [facility], but he is in [place].    VITALS:  Blood pressure (!) 125/52, pulse 64, temperature 97.7 F (36.5 C), temperature source Oral, resp. rate 20, height 5\' 9"  (1.753 m), weight 99.8 kg, SpO2 98 %.  PHYSICAL EXAMINATION:  GENERAL:  85 y.o.-year-old patient lying in the bed with no acute distress.  EYES: Pupils equal, round, reactive to light and accommodation. No scleral icterus. Extraocular muscles intact.  Patient appears very pale. HEENT: Head atraumatic, normocephalic. Oropharynx and nasopharynx clear.  NECK:  Supple, no jugular venous distention. No thyroid enlargement, no tenderness.  LUNGS: Normal  breath sounds bilaterally, no wheezing, rales,rhonchi or crepitation. No use of accessory muscles of respiration.  CARDIOVASCULAR: S1, S2 normal. No murmurs, rubs, or gallops.  ABDOMEN: Soft, nontender, nondistended. Bowel sounds present. No organomegaly or mass.  EXTREMITIES: No pedal edema, cyanosis, or clubbing.  NEUROLOGIC: Cranial nerves II through XII are intact. Muscle strength 4/5 in all extremities. Sensation intact. Gait not checked.  PSYCHIATRIC: The patient is alert and oriented x 3.  SKIN: No obvious rash, lesion, or ulcer.   Physical Exam LABORATORY PANEL:   CBC Recent Labs  Lab 02/08/19 1045  WBC 8.5  HGB 7.2*  HCT 23.8*  PLT 164   ------------------------------------------------------------------------------------------------------------------  Chemistries  Recent Labs  Lab 02/08/19 1045  NA 140  K 4.4  CL 100  CO2 30  GLUCOSE 136*  BUN 48*  CREATININE 1.91*  CALCIUM 8.5*   ------------------------------------------------------------------------------------------------------------------  Cardiac Enzymes No results for input(s): TROPONINI in the last 168 hours. ------------------------------------------------------------------------------------------------------------------  RADIOLOGY:  Dg Chest 2 View  Result Date: 02/08/2019 CLINICAL DATA:  Cough EXAM: CHEST - 2 VIEW COMPARISON:  October 08, 2018 FINDINGS: Mild elevation of the right hemidiaphragm is stable. There is no edema or consolidation. Heart size and pulmonary vascularity are normal. No adenopathy. There is aortic atherosclerosis. Postoperative changes are noted in the neck region and upper right thorax. IMPRESSION: Stable mild elevation of the right hemidiaphragm. No edema or consolidation. Stable cardiac silhouette. Aortic Atherosclerosis (ICD10-I70.0). Electronically Signed   By: Bretta Bang III M.D.   On: 02/08/2019 09:17    ASSESSMENT AND PLAN:  Active Problems:   GI bleed  GI  bleed- in the setting of Xarelto use.  May be hemorrhoidal bleed versus diverticular bleed. Patient is unsure when his last colonoscopy was. -Holding anticoagulation -GI consult  Acute blood loss anemia- due to above.  Hemoglobin 5.8 -Hemoglobin 7.2 after 2 units of PRBC. -We will give 1 more unit of blood transfusion.  AKI-CKD stage III Baseline creatinine 1.4 to in January 2020  likely due to dehydration and poor p.o. intake. -IV fluids -Holding home losartan and Lasix -Avoid nephrotoxic agents -Recheck creatinine in the morning  Leukocytosis- improved today. Chest x-ray, respiratory viral panel, urinalysis are all negative.  No need for antibiotics.  Hypertension-blood pressures stable now. -Holding home losartan -Continue home metoprolol -IV hydralazine PRN  Type 2 diabetes- blood sugars elevated in the ED -SSI  Generalized weakness- patient has been unable to care for himself at home since his recent femur fracture. -PT consult   All the records are reviewed and case discussed with Care Management/Social Workerr. Management plans discussed with the patient, family and they are in agreement.  CODE STATUS: Full code.  TOTAL TIME TAKING CARE OF THIS PATIENT: 35 minutes.     POSSIBLE D/C IN 1-2 DAYS, DEPENDING ON CLINICAL CONDITION.   Altamese DillingVaibhavkumar Shaquitta Burbridge M.D on 02/08/2019   Between 7am to 6pm - Pager - [phone]  After 6pm go to [email or web address] - password [email or web address]  [facility]  Office  [phone]  CC: Primary care physician; Marguarite ArbourSparks, Gary D, MD  Note: This dictation was prepared with Dragon dictation along with smaller phrase technology. Any transcriptional errors that result from this process are unintentional.

## 2019-02-08 NOTE — Consult Note (Signed)
Cephas Darby, MD [address]  [place]  [place], [place] [address]  Main: [phone]  Fax: [phone] Pager: [phone]   Consultation  Referring Provider:     No ref. provider found Primary Care Physician:  Idelle Crouch, MD Primary Gastroenterologist: None         Reason for Consultation:     Iron deficiency anemia  Date of Admission:  02/07/2019 Date of Consultation:  02/08/2019         HPI:   Gary Thomas is a 85 y.o. male with history of dementia, who had hip fracture after a fall in 08/2018 bedbound, been cared of by his wife and caregiver at home since then.  Patient was seen at PCPs office by Dr. Doy Hutching yesterday due to worsening fatigue, poor p.o. intake and inability to ambulate.  He was found to have hemoglobin of 5.8, therefore sent to ER for blood transfusion.  Patient got admitted to GI is consulted due to symptomatic anemia.  There is no documented history of active or recent GI bleed.  Patient received 2 units of PRBCs and responded appropriately.  He is on oral iron as outpatient  When I interviewed the patient, he was sleeping comfortably in bed.  His son called on his phone, and I was able to talk to his son   NSAIDs: None  Antiplts/Anticoagulants/Anti thrombotics: None  GI Procedures: Unknown  Past Medical History:  Diagnosis Date  . Depression   . Diabetes mellitus without complication ([place])   . Dyspnea   . Elevated lipids   . Femur fracture ([place]) 08/22/2018  . GERD (gastroesophageal reflux disease)   . Hypertension   . Pulmonary emboli ([place])   . Renal insufficiency    only has 1 kidney  . Sleep apnea   . Stroke ([place])    Rt side weakness    Past Surgical History:  Procedure Laterality Date  . 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 AORTA SURGERY    . CAROTID ARTERY - SUBCLAVIAN ARTERY BYPASS GRAFT Bilateral   . CATARACT EXTRACTION W/ INTRAOCULAR LENS  IMPLANT, BILATERAL    .  CHOLECYSTECTOMY    . EYE SURGERY    . FEMUR FRACTURE SURGERY Right   . HIP FRACTURE SURGERY Bilateral   . I&D EXTREMITY Right 10/18/2018   Procedure: IRRIGATION AND DEBRIDEMENT EXTREMITY, POSSIBLE HARDWARE REMOVAL OF SUPERCONDYLAR PLATE;  Surgeon: Corky Mull, MD;  Location: [facility] ORS;  Service: Orthopedics;  Laterality: Right;  . JOINT REPLACEMENT    . ORIF FEMUR FRACTURE Right 08/24/2018   Procedure: OPEN REDUCTION INTERNAL FIXATION (ORIF) SUPRACONDYLAR FEMUR FRACTURE;  Surgeon: Corky Mull, MD;  Location: [facility] ORS;  Service: Orthopedics;  Laterality: Right;  . ORIF FEMUR FRACTURE Right 10/18/2018   Procedure: HARDWARE REMOVAL- DISTAL FEMUR FRACTURE FIXATION;  Surgeon: Corky Mull, MD;  Location: [facility] ORS;  Service: Orthopedics;  Laterality: Right;  . PROSTATE SURGERY    . REPLACEMENT TOTAL KNEE Right   . TOTAL HIP ARTHROPLASTY Right 03/02/2017   Procedure: TOTAL HIP ARTHROPLASTY ANTERIOR APPROACH;  Surgeon: [name], MD;  Location: [facility] ORS;  Service: Orthopedics;  Laterality: Right;  Marland Kitchen VASCULAR SURGERY      Prior to Admission medications   Medication Sig Start Date End Date Taking? Authorizing Provider  acetaminophen (TYLENOL) 325 MG tablet Take 2 tablets (650 mg total) by mouth every 6 (six) hours as needed for mild pain (or Fever >/=  101). 08/30/18  Yes Gouru, Aruna, MD  albuterol (PROVENTIL) (2.5 MG/3ML) 0.083% nebulizer solution Take 3 mLs (2.5 mg total) by nebulization every 4 (four) hours as needed for wheezing or shortness of breath. 09/14/18  Yes Gerlene Fee, NP  allopurinol (ZYLOPRIM) 100 MG tablet Take 0.5 tablets (50 mg total) by mouth every other day. 09/14/18  Yes Gerlene Fee, NP  atorvastatin (LIPITOR) 20 MG tablet Take 1 tablet (20 mg total) by mouth at bedtime. 09/14/18  Yes Gerlene Fee, NP  budesonide (PULMICORT) 0.5 MG/2ML nebulizer solution Take 2 mLs (0.5 mg total) by nebulization 2 (two) times daily. 09/14/18  Yes Gerlene Fee, NP  ferrous sulfate  325 (65 FE) MG EC tablet Take 1 tablet (325 mg total) by mouth 3 (three) times daily with meals. 09/14/18  Yes Gerlene Fee, NP  furosemide (LASIX) 40 MG tablet Take 1 tablet (40 mg total) by mouth at bedtime. 09/14/18  Yes Gerlene Fee, NP  insulin aspart (NOVOLOG) 100 UNIT/ML injection Inject 0-5 Units into the skin at bedtime. Correction coverage: HS scale CBG<70: Implement hypoglycemia protocol. CBG 70-120: 0 Units, CBG 121-150: 0 Units, CBG 151-200: 0 Units, CBG 201-250: 2 Units, CBG 251-300: 3 Units, CBG 301-350: 4 Units, CBG 351-400: 5 Units, CBG >400: Call MD and obtain STAT lab verification.   Yes [provider]  ipratropium-albuterol (DUONEB) 0.5-2.5 (3) MG/3ML SOLN Take 3 mLs by nebulization 2 (two) times daily. 09/14/18  Yes Gerlene Fee, NP  losartan (COZAAR) 25 MG tablet Take 1 tablet (25 mg total) by mouth at bedtime. 09/14/18  Yes Gerlene Fee, NP  metoprolol succinate (TOPROL-XL) 100 MG 24 hr tablet Take 1 tablet (100 mg total) by mouth at bedtime. 09/14/18  Yes Gerlene Fee, NP  mirtazapine (REMERON) 15 MG tablet Take 15 mg by mouth at bedtime.   Yes [provider]  multivitamin-lutein (OCUVITE-LUTEIN) CAPS capsule Take 1 capsule by mouth daily.   Yes [provider]  NOVOLOG MIX 70/30 FLEXPEN (70-30) 100 UNIT/ML FlexPen Inject 0.25 mLs (25 Units total) into the skin daily. Patient taking differently: Inject 25 Units into the skin 2 (two) times daily with a meal.  09/14/18  Yes Gerlene Fee, NP  nystatin (MYCOSTATIN/NYSTOP) powder Apply topically as directed. Apply with each ostomy change as instructed   Yes [provider]  omeprazole (PRILOSEC) 40 MG capsule Take 1 capsule (40 mg total) by mouth daily. 09/14/18  Yes Gerlene Fee, NP  sertraline (ZOLOFT) 100 MG tablet Take 1 tablet (100 mg total) by mouth at bedtime. 09/14/18  Yes Gerlene Fee, NP  XARELTO 15 MG TABS tablet Take 1 tablet (15 mg total) by mouth at bedtime.  09/14/18  Yes Gerlene Fee, NP  Cholecalciferol 100 MCG (4000 UT) CAPS Take 1 capsule by mouth daily. 08/31/18   [provider]  HYDROcodone-acetaminophen (NORCO/VICODIN) 5-325 MG tablet Take 1 tablet by mouth every 6 (six) hours as needed for severe pain. Patient not taking: Reported on 02/07/2019 10/21/18   Lattie Corns, PA-C  [employer] [facility] EX) Apply liberal amount topically as needed to area of skin irritation.  Ok to leave at bedside 09/02/18   [provider]  loperamide (IMODIUM A-D) 2 MG tablet Take 2 mg by mouth 4 (four) times daily as needed for diarrhea or loose stools. 09/07/18   [provider]  NON FORMULARY Diet Type: NAS. NCS    [provider]  predniSONE (DELTASONE) 10 MG tablet Label  & dispense according to the schedule below. 5 Pills PO for 1 day then, 4 Pills PO for 1 day, 3 Pills PO for 1 day, 2 Pills PO for 1 day, 1 Pill PO for 1 days then STOP. Patient not taking: Reported on 02/07/2019 10/10/18   Henreitta Leber, MD  tiotropium (SPIRIVA) 18 MCG inhalation capsule Place 18 mcg into inhaler and inhale daily. 08/30/18   [provider]  traMADol (ULTRAM) 50 MG tablet Take 1 tablet (50 mg total) by mouth every 6 (six) hours as needed for moderate pain. Patient not taking: Reported on 02/07/2019 09/14/18   Gerlene Fee, NP   Current Facility-Administered Medications:  .  0.9 %  sodium chloride infusion, , Intravenous, Continuous, Mayo, Pete Pelt, MD, Stopped at 02/08/19 1459 .  acetaminophen (TYLENOL) tablet 650 mg, 650 mg, Oral, Q6H PRN **OR** acetaminophen (TYLENOL) suppository 650 mg, 650 mg, Rectal, Q6H PRN, Mayo, Pete Pelt, MD .  albuterol (PROVENTIL) (2.5 MG/3ML) 0.083% nebulizer solution 2.5 mg, 2.5 mg, Nebulization, Q4H PRN, Mayo, Pete Pelt, MD .  allopurinol (ZYLOPRIM) tablet 50 mg, 50 mg, Oral, Q48H, Mayo, Pete Pelt, MD, 50 mg at 02/08/19 0902 .  atorvastatin (LIPITOR) tablet 20 mg, 20 mg, Oral,  QHS, Mayo, Pete Pelt, MD .  budesonide (PULMICORT) nebulizer solution 0.5 mg, 0.5 mg, Nebulization, BID, Mayo, Pete Pelt, MD, 0.5 mg at 02/08/19 0727 .  guaiFENesin-dextromethorphan (ROBITUSSIN DM) 100-10 MG/5ML syrup 5 mL, 5 mL, Oral, Q4H PRN, Mayo, Pete Pelt, MD .  hydrALAZINE (APRESOLINE) injection 5 mg, 5 mg, Intravenous, Q4H PRN, Mayo, Pete Pelt, MD .  insulin aspart (novoLOG) injection 0-5 Units, 0-5 Units, Subcutaneous, QHS, Mayo, Pete Pelt, MD .  insulin aspart (novoLOG) injection 0-9 Units, 0-9 Units, Subcutaneous, TID WC, Mayo, Pete Pelt, MD .  ipratropium-albuterol (DUONEB) 0.5-2.5 (3) MG/3ML nebulizer solution 3 mL, 3 mL, Nebulization, BID, Mayo, Pete Pelt, MD, 3 mL at 02/08/19 0727 .  metoprolol succinate (TOPROL-XL) 24 hr tablet 100 mg, 100 mg, Oral, QHS, Mayo, Pete Pelt, MD .  mirtazapine (REMERON) tablet 15 mg, 15 mg, Oral, QHS, Mayo, Pete Pelt, MD .  mupirocin ointment (BACTROBAN) 2 %, , Nasal, BID, Vaughan Basta, MD .  ondansetron (ZOFRAN) tablet 4 mg, 4 mg, Oral, Q6H PRN **OR** ondansetron (ZOFRAN) injection 4 mg, 4 mg, Intravenous, Q6H PRN, Mayo, Pete Pelt, MD .  sertraline (ZOLOFT) tablet 100 mg, 100 mg, Oral, QHS, Mayo, Pete Pelt, MD   Family History  Problem Relation Age of Onset  . Heart attack Father   . Stroke Father   . Heart disease Father   . Heart attack Mother      Social History   Tobacco Use  . Smoking status: Former Smoker    Packs/day: 1.00    Years: 7.00    Pack years: 7.00    Types: Cigarettes    Last attempt to quit: 02/18/1952    Years since quitting: 67.0  . Smokeless tobacco: Former Network engineer Use Topics  . Alcohol use: No  . Drug use: No    Allergies as of 02/07/2019 - Review Complete 02/07/2019  Allergen Reaction Noted  . Codeine Anaphylaxis 01/30/2014  . Morphine Anaphylaxis 01/30/2014  . Gabapentin Other (See Comments) 01/30/2014  . Stadol [butorphanol]  03/02/2017  . Lyrica [pregabalin] Rash 01/30/2014  .  Tapentadol Nausea Only 01/30/2014  . Tramadol  10/08/2018    Review of Systems:    All systems reviewed and  negative except where noted in HPI.   Physical Exam:  Vital signs in last 24 hours: Temp:  [97.3 F (36.3 C)-98.5 F (36.9 C)] 97.4 F (36.3 C) (04/29 1528) Pulse Rate:  [61-90] 73 (04/29 1528) Resp:  [17-23] 20 (04/29 1528) BP: (98-152)/(44-76) 128/58 (04/29 1528) SpO2:  [88 %-100 %] 88 % (04/29 1528) Weight:  [99.8 kg] 99.8 kg (04/28 2147)   General:   Pleasant, cooperative in NAD Head:  Normocephalic and atraumatic. Eyes:   No icterus.   Conjunctiva pale. PERRLA. Ears:  Normal auditory acuity. Neck:  Supple; no masses or thyroidomegaly Lungs: Respirations even and unlabored. Lungs clear to auscultation bilaterally.   No wheezes, crackles, or rhonchi.  Heart:  Regular rate and rhythm;  Without murmur, clicks, rubs or gallops Abdomen:  Soft, nondistended, nontender. Normal bowel sounds. No appreciable masses or hepatomegaly.  No rebound or guarding, urostomy in right lower quadrant.  Rectal:  Not performed. Msk:  Symmetrical without gross deformities.  Extremities:  Without edema, cyanosis or clubbing. Neurologic:  Alert and oriented x1;  grossly normal neurologically. Skin:  Intact without significant lesions or rashes.  LAB RESULTS: CBC Latest Ref Rng & Units 02/08/2019 02/07/2019 10/21/2018  WBC 4.0 - 10.5 K/uL 8.5 16.1(H) 8.9  Hemoglobin 13.0 - 17.0 g/dL 7.2(L) 5.8(L) 9.2(L)  Hematocrit 39.0 - 52.0 % 23.8(L) 21.8(L) 31.5(L)  Platelets 150 - 400 K/uL 164 221 156    BMET BMP Latest Ref Rng & Units 02/08/2019 02/07/2019 10/21/2018  Glucose 70 - 99 mg/dL 136(H) 178(H) 104(H)  BUN 8 - 23 mg/dL 48(H) 53(H) 29(H)  Creatinine 0.61 - 1.24 mg/dL 1.91(H) 2.16(H) 1.49(H)  Sodium 135 - 145 mmol/L 140 141 140  Potassium 3.5 - 5.1 mmol/L 4.4 4.8 4.5  Chloride 98 - 111 mmol/L 100 99 104  CO2 22 - 32 mmol/L '30 30 31  '$ Calcium 8.9 - 10.3 mg/dL 8.5(L) 9.2 8.4(L)    LFT Hepatic  Function Latest Ref Rng & Units 10/06/2018 08/28/2018 08/22/2018  Total Protein 6.5 - 8.1 g/dL 6.7 6.5 6.8  Albumin 3.5 - 5.0 g/dL 3.4(L) 2.9(L) 3.7  AST 15 - 41 U/L 25 55(H) 26  ALT 0 - 44 U/L '14 22 15  '$ Alk Phosphatase 38 - 126 U/L 69 74 81  Total Bilirubin 0.3 - 1.2 mg/dL 0.7 0.7 0.6     STUDIES: Dg Chest 2 View  Result Date: 02/08/2019 CLINICAL DATA:  Cough EXAM: CHEST - 2 VIEW COMPARISON:  October 08, 2018 FINDINGS: Mild elevation of the right hemidiaphragm is stable. There is no edema or consolidation. Heart size and pulmonary vascularity are normal. No adenopathy. There is aortic atherosclerosis. Postoperative changes are noted in the neck region and upper right thorax. IMPRESSION: Stable mild elevation of the right hemidiaphragm. No edema or consolidation. Stable cardiac silhouette. Aortic Atherosclerosis (ICD10-I70.0). Electronically Signed   By: Lowella Grip III M.D.   On: 02/08/2019 09:17      Impression / Plan:   LARELL BANEY is a 85 y.o. male with history of metabolic syndrome, status post hip fracture in 37/1696, repair complicated by infection, bedbound, dependent on ADLs, known iron deficiency anemia presents with severe symptomatic anemia without active GI bleed  Iron deficiency anemia Patient responded appropriately to 2 units of PRBCs Recommend parenteral iron Long-term oral iron as outpatient Monitor CBC closely  Attempted to call patient's wife.  She did not pick up land phone and mobile. Not sure if she wants him to undergo EGD and colonoscopy although  he is not actively bleeding.  With his overall age, medical comorbidities and poor functional status, it is appropriate to manage conservatively rather than putting him through endoscopic procedures which involves anesthesia, risks overweigh benefits in his case.  Also, colonoscopy prep will be challenging given his bedbound status    Thank you for involving me in the care of this patient.      LOS: 1 day    Sherri Sear, MD  02/08/2019, 4:10 PM   Note: This dictation was prepared with Dragon dictation along with smaller phrase technology. Any transcriptional errors that result from this process are unintentional.

## 2019-02-08 NOTE — TOC Initial Note (Signed)
Transition of Care [facility](TOC) - Initial/Assessment Note    Patient Details  Name: Gary Thomas MRN: [id number] Date of Birth: 07/26/1932  Transition of Care [facility](TOC) CM/SW Contact:    Barrie Dunkereliliah J Naquita Nappier, RN Phone Number: 02/08/2019, 3:46 PM  Clinical Narrative:                 Spoke with the patient to discuss DC plan and needs He lives at home with his wife that he has been married to for 63 years He said he does not get out of bed and uses diapers for bathroom, his wife takes care of him and he has Home health with [facility], He does not leave the house I called and spoke to [facility], they have been working with the family and the patient for some time They are trying to find placement for long term care and will continue to do so Rosey Batheresa with [facility] stated that they would continue to follow the patient once they discharge and help with any needs They would like the patient to get a hospital bed at home Notified Brad with [employer]   Expected Discharge Plan: Home w Home Health Services Barriers to Discharge: Continued Medical Work up   Patient Goals and CMS Choice Patient states their goals for this hospitalization and ongoing recovery are:: go home CMS Medicare.gov Compare Post Acute Care list provided to:: Patient Choice offered to / list presented to : Patient  Expected Discharge Plan and Services Expected Discharge Plan: Home w Home Health Services   Discharge Planning Services: CM Consult Post Acute Care Choice: Home Health, Durable Medical Equipment(hospital bed) Living arrangements for the past 2 months: Single Family Home Expected Discharge Date: 02/09/19                   Date DME Agency Contacted: 02/08/19 Time DME Agency Contacted: 1544 Representative spoke with at DME Agency: Rosey Batheresa HH Arranged: PT, Social Work [employer] [facility] Date HH Agency Contacted: 02/08/19 Time HH Agency Contacted: 1545 [facility]  Agency: Rosey Batheresa  Prior Living Arrangements/Services Living arrangements for the past 2 months: Single Family Home Lives with:: Self Patient language and need for interpreter reviewed:: No Do you feel safe going back to the place where you live?: Yes      Need for Family Participation in Patient Care: Yes (Comment) Care giver support system in place?: Yes (comment) Current home services: Home PT, Home RN Criminal Activity/Legal Involvement Pertinent to Current Situation/Hospitalization: No - Comment as needed  Activities of Daily Living Home Assistive Devices/Equipment: Blood pressure cuff, CBG Meter ADL Screening (condition at time of admission) Patient's cognitive ability adequate to safely complete daily activities?: Yes Is the patient deaf or have difficulty hearing?: No Does the patient have difficulty seeing, even when wearing glasses/contacts?: No Does the patient have difficulty concentrating, remembering, or making decisions?: Yes Patient able to express need for assistance with ADLs?: Yes Does the patient have difficulty dressing or bathing?: Yes Independently performs ADLs?: No Communication: Independent Dressing (OT): Dependent Is this a change from baseline?: Pre-admission baseline Grooming: Needs assistance Is this a change from baseline?: Pre-admission baseline Feeding: Independent Bathing: Dependent Is this a change from baseline?: Pre-admission baseline Toileting: Dependent Is this a change from baseline?: Pre-admission baseline In/Out Bed: Dependent Is this a change from baseline?: Pre-admission baseline Walks in Home: Dependent Is this a change from baseline?: Pre-admission baseline Does the patient have difficulty walking or climbing stairs?:  Yes Weakness of Legs: Both Weakness of Arms/Hands: None  Permission Sought/Granted                  Emotional Assessment Appearance:: Appears stated age Attitude/Demeanor/Rapport: Engaged, Gracious Affect  (typically observed): Accepting Orientation: : Oriented to Self, Oriented to Place, Oriented to  Time, Oriented to Situation Alcohol / Substance Use: Never Used Psych Involvement: No (comment)  Admission diagnosis:  Symptomatic anemia [D64.9] Patient Active Problem List   Diagnosis Date Noted  . GI bleed 02/07/2019  . Postoperative wound infection 10/17/2018  . Supracondylar fracture of distal end of femur without intracondylar extension (HCC) 10/17/2018  . Sepsis (HCC) 10/06/2018  . Chronic pulmonary embolism (HCC) 09/06/2018  . Primary osteoarthritis of right knee 09/06/2018  . Status post right knee replacement 09/06/2018  . Dyslipidemia associated with type 2 diabetes mellitus (HCC) 09/06/2018  . CKD stage 3 due to type 2 diabetes mellitus (HCC) 09/06/2018  . Controlled type 2 diabetes mellitus with stage 3 chronic kidney disease, with long-term current use of insulin (HCC) 09/06/2018  . Chronic cerebrovascular accident (CVA) 09/06/2018  . GERD without esophagitis 09/06/2018  . Closed nondisplaced supracondylar fracture of distal end of femur without intracondylar extension (HCC) 09/06/2018  . Acute on chronic anemia 09/06/2018  . Depression, major, single episode, in partial remission (HCC) 09/06/2018  . Bilateral lower extremity edema 09/06/2018  . Chronic gout due to renal impairment 09/06/2018  . COPD with respiratory failure, acute (HCC) 08/28/2018  . Spondylolisthesis of lumbar region 02/15/2018  . PAD (peripheral artery disease) (HCC) 08/19/2017  . Hyperlipidemia 07/05/2017  . Essential hypertension 07/05/2017  . Bilateral carotid artery stenosis 07/05/2017  . Primary localized osteoarthritis of right hip 03/02/2017   PCP:  Marguarite Arbour, MD Pharmacy:   [facility] [phone] [place], [place] - [address] [place] [address] Phone: [phone] Fax: [phone]  [facility] [place], [place] - [address]  [address] [place] [address] Phone: [phone] Fax: [phone]     Social Determinants of Health (SDOH) Interventions    Readmission Risk Interventions No flowsheet data found.

## 2019-02-09 LAB — BPAM RBC
Blood Product Expiration Date: 202005012359
Blood Product Expiration Date: 202005022359
Blood Product Expiration Date: 202005022359
Blood Product Expiration Date: 202005142359
ISSUE DATE / TIME: 202004290144
ISSUE DATE / TIME: 202004290604
ISSUE DATE / TIME: 202004291504
Unit Type and Rh: 5100
Unit Type and Rh: 9500
Unit Type and Rh: 9500
Unit Type and Rh: 9500

## 2019-02-09 LAB — TYPE AND SCREEN
ABO/RH(D): O POS
Antibody Screen: NEGATIVE
Unit division: 0
Unit division: 0
Unit division: 0
Unit division: 0

## 2019-02-09 LAB — BASIC METABOLIC PANEL
Anion gap: 7 (ref 5–15)
BUN: 33 mg/dL — ABNORMAL HIGH (ref 8–23)
CO2: 27 mmol/L (ref 22–32)
Calcium: 8.6 mg/dL — ABNORMAL LOW (ref 8.9–10.3)
Chloride: 107 mmol/L (ref 98–111)
Creatinine, Ser: 1.54 mg/dL — ABNORMAL HIGH (ref 0.61–1.24)
GFR calc Af Amer: 47 mL/min — ABNORMAL LOW (ref >60)
GFR calc non Af Amer: 41 mL/min — ABNORMAL LOW (ref >60)
Glucose, Bld: 89 mg/dL (ref 70–99)
Potassium: 4.1 mmol/L (ref 3.5–5.1)
Sodium: 141 mmol/L (ref 135–145)

## 2019-02-09 LAB — CBC
HCT: 29.2 % — ABNORMAL LOW (ref 39.0–52.0)
Hemoglobin: 8.8 g/dL — ABNORMAL LOW (ref 13.0–17.0)
MCH: 24.3 pg — ABNORMAL LOW (ref 26.0–34.0)
MCHC: 30.1 g/dL (ref 30.0–36.0)
MCV: 80.7 fL (ref 80.0–100.0)
Platelets: 153 10*3/uL (ref 150–400)
RBC: 3.62 MIL/uL — ABNORMAL LOW (ref 4.22–5.81)
RDW: 16.9 % — ABNORMAL HIGH (ref 11.5–15.5)
WBC: 8.1 10*3/uL (ref 4.0–10.5)
nRBC: 0 % (ref 0.0–0.2)

## 2019-02-09 LAB — GLUCOSE, CAPILLARY
Glucose-Capillary: 99 mg/dL (ref 70–99)
Glucose-Capillary: 134 mg/dL — ABNORMAL HIGH (ref 70–99)
Glucose-Capillary: 126 mg/dL — ABNORMAL HIGH (ref 70–99)
Glucose-Capillary: 114 mg/dL — ABNORMAL HIGH (ref 70–99)

## 2019-02-09 MED ORDER — GLUCERNA SHAKE PO LIQD
237.0000 mL | Freq: Three times a day (TID) | ORAL | Status: DC
  Administered 2019-02-09 – 2019-02-10 (×3): 237 mL via ORAL

## 2019-02-09 MED ORDER — CHLORHEXIDINE GLUCONATE CLOTH 2 % EX PADS
6.0000 | MEDICATED_PAD | Freq: Every day | CUTANEOUS | Status: DC
  Administered 2019-02-09 – 2019-02-10 (×2): 6 via TOPICAL

## 2019-02-09 NOTE — Progress Notes (Signed)
Initial Nutrition Assessment  RD working remotely.  DOCUMENTATION CODES:   Obesity unspecified  INTERVENTION:  Provide Glucerna Shake po TID, each supplement provides 220 kcal and 10 grams of protein. Patient prefers chocolate or strawberry.  Encouraged adequate intake of calories and protein at meals.  NUTRITION DIAGNOSIS:   Inadequate oral intake related to decreased appetite as evidenced by per patient/family report.  GOAL:   Patient will meet greater than or equal to 90% of their needs  MONITOR:   PO intake, Supplement acceptance, Diet advancement, Labs, Weight trends, I & O's  REASON FOR ASSESSMENT:   Malnutrition Screening Tool    ASSESSMENT:   85 year old male with PMHx of HLD, HTN, depression, DM, hx pulmonary emboli, GERD, renal insufficiency, hx CVA, sleep apnea admitted with generalized weakness, GI bleed in setting of Xarelto use, acute blood loss anemia, AKI on CKD III.   Spoke with patient over the phone to obtain nutrition/weight history. Patient reports he has had a decreased appetite for months now. He believes it is because he has been feeling weak. He still tries to eat 2-3 meals per day but is eating less than usual. He reports he is hungry today and ready for "real food." He does not like the liquid diet. Patient requesting Glucerna as he drinks it at home.  Patient reports his UBW is 240 lbs. He believes he has lost about 20 lbs over a few months, but reports he has not been weighed ina while. Current weight appears stated as it is exactly 220 lbs.  Medications reviewed and include: allopurinol, Novolog 0-9 units TID, Novolog 0-5 units QHS, Remeron 15 mg QHS, sertraline, NS @ 75 mL/hr.  Labs reviewed: CBG 99-175, BUN 33, Creatinine 1.54.  NUTRITION - FOCUSED PHYSICAL EXAM:  Unable to complete at this time.  Diet Order:   Diet Order            Diet full liquid Room service appropriate? Yes; Fluid consistency: Thin  Diet effective now              EDUCATION NEEDS:   Education needs have been addressed  Skin:  Skin Assessment: Reviewed RN Assessment(ecchymosis)  Last BM:  02/08/2019 per chart  Height:   Ht Readings from Last 1 Encounters:  02/07/19 5\' 9"  (1.753 m)   Weight:   Wt Readings from Last 1 Encounters:  02/07/19 99.8 kg   Ideal Body Weight:  72.7 kg  BMI:  Body mass index is 32.49 kg/m.  Estimated Nutritional Needs:   Kcal:  2000-2200  Protein:  100-110 grams  Fluid:  2-2.2 L/day  Helane Rima, MS, RD, LDN Office: [phone] Pager: [phone] After Hours/Weekend Pager: [phone]

## 2019-02-09 NOTE — TOC Progression Note (Signed)
Transition of Care [facility]) - Progression Note    Patient Details  Name: Gary Thomas MRN: [id number] Date of Birth: 05-07-1932  Transition of Care [facility]) CM/SW Contact  Barrie Dunker, RN Phone Number: 02/09/2019, 10:15 AM  Clinical Narrative:     Clovis Cao was completed at request of Rosey Bath with [facility] to aide in long term placement, Patient was able to get up to chair today without assistance when PT was working with him Continue to monitor for needs  Expected Discharge Plan: Home w Home Health Services Barriers to Discharge: Continued Medical Work up  Expected Discharge Plan and Services Expected Discharge Plan: Home w Home Health Services   Discharge Planning Services: CM Consult Post Acute Care Choice: Home Health, Durable Medical Equipment(hospital bed) Living arrangements for the past 2 months: Single Family Home Expected Discharge Date: 02/09/19               DME Arranged: Hospital bed DME Agency: [employer] Date DME Agency Contacted: 02/08/19 Time DME Agency Contacted: [phone] Representative spoke with at DME Agency: Nida Boatman HH Arranged: PT, Social Work [employer] Agency: [facility] Date HH Agency Contacted: 02/08/19 Time HH Agency Contacted: 1545 Representative spoke with at [facility] Agency: Rosey Bath   Social Determinants of Health (SDOH) Interventions    Readmission Risk Interventions No flowsheet data found.

## 2019-02-09 NOTE — Progress Notes (Signed)
[employer] - [place] at [facility]   PATIENT NAME: Gary Thomas    MR#:  [id number]  DATE OF BIRTH:  [birth year]  SUBJECTIVE:  CHIEF COMPLAINT:   Chief Complaint  Patient presents with  . Abnormal Lab   Noted to be anemic, after transfusion feels slightly better. Was able to get up with PT today.  REVIEW OF SYSTEMS:  CONSTITUTIONAL: No fever, have fatigue or weakness.  EYES: No blurred or double vision.  EARS, NOSE, AND THROAT: No tinnitus or ear pain.  RESPIRATORY: No cough, shortness of breath, wheezing or hemoptysis.  CARDIOVASCULAR: No chest pain, orthopnea, edema.  GASTROINTESTINAL: No nausea, vomiting, diarrhea or abdominal pain.  GENITOURINARY: No dysuria, hematuria.  ENDOCRINE: No polyuria, nocturia,  HEMATOLOGY: No anemia, easy bruising or bleeding SKIN: No rash or lesion. MUSCULOSKELETAL: No joint pain or arthritis.   NEUROLOGIC: No tingling, numbness, weakness.  PSYCHIATRY: No anxiety or depression.   ROS  DRUG ALLERGIES:   Allergies  Allergen Reactions  . Codeine Anaphylaxis  . Morphine Anaphylaxis  . Gabapentin Other (See Comments)    Unsure of what the reaction was   . Stadol [Butorphanol]     Unknown  . Lyrica [Pregabalin] Rash  . Tapentadol Nausea Only    ONSET 01/04/2013  . Tramadol     Hallucinations "I see giant spiders on ceiling". Pt also believes he is in [facility], but he is in [place].    VITALS:  Blood pressure (!) 126/58, pulse 66, temperature 97.6 F (36.4 C), temperature source Oral, resp. rate 20, height 5\' 9"  (1.753 m), weight 99.8 kg, SpO2 95 %.  PHYSICAL EXAMINATION:  GENERAL:  85 y.o.-year-old patient lying in the bed with no acute distress.  EYES: Pupils equal, round, reactive to light and accommodation. No scleral icterus. Extraocular muscles intact.  Patient appears very pale. HEENT: Head atraumatic, normocephalic. Oropharynx and nasopharynx clear.  NECK:  Supple, no jugular venous distention. No thyroid  enlargement, no tenderness.  LUNGS: Normal breath sounds bilaterally, no wheezing, rales,rhonchi or crepitation. No use of accessory muscles of respiration.  CARDIOVASCULAR: S1, S2 normal. No murmurs, rubs, or gallops.  ABDOMEN: Soft, nontender, nondistended. Bowel sounds present. No organomegaly or mass.  EXTREMITIES: No pedal edema, cyanosis, or clubbing.  NEUROLOGIC: Cranial nerves II through XII are intact. Muscle strength 4/5 in all extremities. Sensation intact. Gait not checked.  PSYCHIATRIC: The patient is alert and oriented x 3.  SKIN: No obvious rash, lesion, or ulcer.   Physical Exam LABORATORY PANEL:   CBC Recent Labs  Lab 02/09/19 0411  WBC 8.1  HGB 8.8*  HCT 29.2*  PLT 153   ------------------------------------------------------------------------------------------------------------------  Chemistries  Recent Labs  Lab 02/09/19 0411  NA 141  K 4.1  CL 107  CO2 27  GLUCOSE 89  BUN 33*  CREATININE 1.54*  CALCIUM 8.6*   ------------------------------------------------------------------------------------------------------------------  Cardiac Enzymes No results for input(s): TROPONINI in the last 168 hours. ------------------------------------------------------------------------------------------------------------------  RADIOLOGY:  Dg Chest 2 View  Result Date: 02/08/2019 CLINICAL DATA:  Cough EXAM: CHEST - 2 VIEW COMPARISON:  October 08, 2018 FINDINGS: Mild elevation of the right hemidiaphragm is stable. There is no edema or consolidation. Heart size and pulmonary vascularity are normal. No adenopathy. There is aortic atherosclerosis. Postoperative changes are noted in the neck region and upper right thorax. IMPRESSION: Stable mild elevation of the right hemidiaphragm. No edema or consolidation. Stable cardiac silhouette. Aortic Atherosclerosis (ICD10-I70.0). Electronically Signed   By: Bretta Bang III M.D.  On: 02/08/2019 09:17    ASSESSMENT AND  PLAN:   Active Problems:   GI bleed  GI bleed- in the setting of Xarelto use.  May be hemorrhoidal bleed versus diverticular bleed. Patient is unsure when his last colonoscopy was. -Holding anticoagulation -GI consult appreciated. As patient is not actively bleeding and hemoglobin is stable plus patient her multiple medical problems, GI suggested no urgent need for procedures and continue to monitor.  I would advised to stop Xarelto. Patient was also using BC powders at least 1-2 times a day.  I have strongly advised against using that and take Tylenol if he have 2 for his joint pains.  Acute blood loss anemia- due to above.  Hemoglobin 5.8 -Hemoglobin 7.2 after 2 units of PRBC. - given 1 more unit - Hb stable now.  AKI-CKD stage III Baseline creatinine 1.4 to in January 2020  likely due to dehydration and poor p.o. intake. -IV fluids -Holding home losartan and Lasix -Avoid nephrotoxic agents -Recheck creatinine in the morning  Leukocytosis- improved today. Chest x-ray, respiratory viral panel, urinalysis are all negative.  No need for antibiotics.  Hypertension-blood pressures stable now. -Holding home losartan -Continue home metoprolol -IV hydralazine PRN  Type 2 diabetes- blood sugars elevated in the ED -SSI  Generalized weakness- patient has been unable to care for himself at home since his recent femur fracture. -PT consult- home health advised.   All the records are reviewed and case discussed with Care Management/Social Workerr. Management plans discussed with the patient, family and they are in agreement.  CODE STATUS: Full code.  TOTAL TIME TAKING CARE OF THIS PATIENT: 35 minutes.     POSSIBLE D/C IN 1-2 DAYS, DEPENDING ON CLINICAL CONDITION. Likely discharge tomorrow.  We will continue to monitor hemoglobin today and upgrade his diet.  Altamese DillingVaibhavkumar Tyaisha Cullom M.D on 02/09/2019   Between 7am to 6pm - Pager - [phone]  After 6pm go to [email or web address]  - password [email or web address]  [facility]  Office  [phone]  CC: Primary care physician; Marguarite ArbourSparks, [name], MD  Note: This dictation was prepared with Dragon dictation along with smaller phrase technology. Any transcriptional errors that result from this process are unintentional.

## 2019-02-09 NOTE — Evaluation (Signed)
Physical Therapy Evaluation Patient Details Name: Gary Thomas MRN: [id number] DOB: May 03, 1932 Today's Date: 02/09/2019   History of Present Illness  85 y.o. male with history of dementia, who had hip fracture after a fall in 08/2018 has not ambulated, cared of by his wife and caregiver at home since then.  Patient was seen at PCPs office due to worsening fatigue, poor p.o. intake, hemoglobin 5.8. PMH of ORIF of R upper condylar femur fx, underwent I&D and hardware removal 10/18/2018,  DM, HTN, pulmonary emboli, renal insufficiency (1kidney) stroke (R sided weakness)    Clinical Impression  Patient alert, oriented to self, situation, place. Behavior WFLs. Occasional confusion noted when providing medical history/PLOF, verified with chart review. Pt endorsed that he is in bed all day, but has recently been (~1 wk) per patient working on transferring to a chair. Wife assists with ADLs, needs set up for eating, stated he has not ambulated in "a very long time".    Patient able to lift RLE off of bed with very little physical assist, weaker LLE, AAROM. Poor DF bilaterally, generalized UE weakness but functional for transfers. Pt was eager to mobilize. Performed supine to sit with supervision and heavy use of bed rails. Pt was able to scoot anteriorly, and then laterally to the R at EOB with CGA. Lateral scoot transfer performed to recliner (to the R) with CGA. Difficulty noted clearing buttocks from chair to adjust positioning, minA to perform.  Overall the patient demonstrated deficits (see "PT Problem List") that impede the patient's functional abilities, safety, and mobility and would benefit from skilled PT intervention. Recommendation is HHPT with supervision/assistance 24hrs.     Follow Up Recommendations Home health PT;Supervision/Assistance - 24 hour    Equipment Recommendations  Other (comment)(Pt reported already having a WC at home)    Recommendations for Other Services        Precautions / Restrictions Precautions Precautions: Fall Restrictions Weight Bearing Restrictions: No      Mobility  Bed Mobility Overal bed mobility: Needs Assistance Bed Mobility: Supine to Sit     Supine to sit: Supervision     General bed mobility comments: heavy use of bed rails, able to scoot anteriorly with use of bilateral UE. Supervision for safety.  Transfers Overall transfer level: Needs assistance   Transfers: Lateral/Scoot Transfers          Lateral/Scoot Transfers: Min guard General transfer comment: Scoot to R side with dropped arm of recliner. CGA for safety, verbal cues for hand placement  Ambulation/Gait             General Gait Details: deferred, pt endorses that he has not walked in a very long time (confirmed with chart review)  Stairs            Wheelchair Mobility    Modified Rankin (Stroke Patients Only)       Balance Overall balance assessment: Needs assistance Sitting-balance support: Feet supported Sitting balance-Leahy Scale: Fair                                       Pertinent Vitals/Pain Pain Assessment: No/denies pain    Home Living Family/patient expects to be discharged to:: Private residence Living Arrangements: Spouse/significant other Available Help at Discharge: Family;Available 24 hours/day;Personal care attendant;Home health Type of Home: House Home Access: Ramped entrance     Home Layout: One level Home Equipment: Walker - 2  wheels;Cane - single point;Shower seat;Bedside commode;Hospital bed;Wheelchair - manual      Prior Function Level of Independence: Needs assistance   Gait / Transfers Assistance Needed: mixed information, pt reported that he spends all his time in bed, but in the last week has been transferring to his chair. Stated he cannot get out of his recliner at home.  ADL's / Homemaking Assistance Needed: wears diapers, urostomy, wife assists with baths in bed. Pt needs  set up assistance for eating  Comments: Pt not the most reliable historian, some confusion during conversation.      Hand Dominance   Dominant Hand: Right    Extremity/Trunk Assessment   Upper Extremity Assessment Upper Extremity Assessment: Generalized weakness    Lower Extremity Assessment Lower Extremity Assessment: Generalized weakness(Unable to bend R knee, AAROM for SLR in supine bilaterally.)       Communication      Cognition Arousal/Alertness: Awake/alert Behavior During Therapy: WFL for tasks assessed/performed Overall Cognitive Status: No family/caregiver present to determine baseline cognitive functioning                                 General Comments: alert, oriented to self, situation, place, some changes in patient PLOF/history during conversation but predominantly [facility]      General Comments      Exercises     Assessment/Plan    PT Assessment Patient needs continued PT services  PT Problem List Decreased strength;Decreased mobility;Decreased safety awareness;Decreased balance;Decreased knowledge of use of DME;Decreased activity tolerance       PT Treatment Interventions DME instruction;Functional mobility training;Balance training;Patient/family education;Therapeutic activities;Neuromuscular re-education;Wheelchair mobility training;Therapeutic exercise    PT Goals (Current goals can be found in the Care Plan section)  Acute Rehab PT Goals Patient Stated Goal: to get back to walking PT Goal Formulation: With patient Time For Goal Achievement: 02/23/19 Potential to Achieve Goals: Fair    Frequency Min 2X/week   Barriers to discharge        Co-evaluation               AM-PAC PT "6 Clicks" Mobility  Outcome Measure Help needed turning from your back to your side while in a flat bed without using bedrails?: A Little Help needed moving from lying on your back to sitting on the side of a flat bed without using bedrails?: A  Little Help needed moving to and from a bed to a chair (including a wheelchair)?: A Little Help needed standing up from a chair using your arms (e.g., wheelchair or bedside chair)?: Total Help needed to walk in hospital room?: Total Help needed climbing 3-5 steps with a railing? : Total 6 Click Score: 12    End of Session Equipment Utilized During Treatment: Gait belt Activity Tolerance: Patient tolerated treatment well Patient left: in chair;with chair alarm set Nurse Communication: Mobility status PT Visit Diagnosis: Unsteadiness on feet (R26.81);Muscle weakness (generalized) (M62.81);Repeated falls (R29.6);Difficulty in walking, not elsewhere classified (R26.2)    Time: [id number] PT Time Calculation (min) (ACUTE ONLY): 42 min   Charges:   PT Evaluation $PT Eval Low Complexity: 1 Low PT Treatments $Therapeutic Activity: 23-37 mins        Olga Coasteriana Bailea Beed PT, DPT 10:57 AM,02/09/19 [phone]

## 2019-02-09 NOTE — [place] FL2 (Signed)
[place] MEDICAID FL2 LEVEL OF CARE SCREENING TOOL     IDENTIFICATION  Patient Name: Gary Thomas Birthdate: Sep 22, 1932 Sex: male Admission Date (Current Location): 02/07/2019  [place]   Facility and Address:  [facility], [address], [place], [place] [address]      Provider Number: [id number]  Attending Physician Name and Address:  Altamese DillingVachhani, Vaibhavkumar, *  Relative Name and Phone Number:  Wife Jessica Priestdna [place] [phone], [phone]    Current Level of Care: Hospital Recommended Level of Care: Nursing Facility Prior Approval Number:    Date Approved/Denied: 12/09/12 PASRR Number: [id number]  Discharge Plan: Home    Current Diagnoses: Patient Active Problem List   Diagnosis Date Noted  . GI bleed 02/07/2019  . Postoperative wound infection 10/17/2018  . Supracondylar fracture of distal end of femur without intracondylar extension (HCC) 10/17/2018  . Sepsis (HCC) 10/06/2018  . Chronic pulmonary embolism (HCC) 09/06/2018  . Primary osteoarthritis of right knee 09/06/2018  . Status post right knee replacement 09/06/2018  . Dyslipidemia associated with type 2 diabetes mellitus (HCC) 09/06/2018  . CKD stage 3 due to type 2 diabetes mellitus (HCC) 09/06/2018  . Controlled type 2 diabetes mellitus with stage 3 chronic kidney disease, with long-term current use of insulin (HCC) 09/06/2018  . Chronic cerebrovascular accident (CVA) 09/06/2018  . GERD without esophagitis 09/06/2018  . Closed nondisplaced supracondylar fracture of distal end of femur without intracondylar extension (HCC) 09/06/2018  . Acute on chronic anemia 09/06/2018  . Depression, major, single episode, in partial remission (HCC) 09/06/2018  . Bilateral lower extremity edema 09/06/2018  . Chronic gout due to renal impairment 09/06/2018  . COPD with respiratory failure, acute (HCC) 08/28/2018  . Spondylolisthesis of lumbar region 02/15/2018   . PAD (peripheral artery disease) (HCC) 08/19/2017  . Hyperlipidemia 07/05/2017  . Essential hypertension 07/05/2017  . Bilateral carotid artery stenosis 07/05/2017  . Primary localized osteoarthritis of right hip 03/02/2017    Orientation RESPIRATION BLADDER Height & Weight     Self, Time, Situation, Place  Normal Incontinent Weight: 99.8 kg Height:  5\' 9"  (175.3 cm)  BEHAVIORAL SYMPTOMS/MOOD NEUROLOGICAL BOWEL NUTRITION STATUS      Incontinent Diet(normal)  AMBULATORY STATUS COMMUNICATION OF NEEDS Skin   Total Care Verbally Normal                       Personal Care Assistance Level of Assistance  Total care       Total Care Assistance: Maximum assistance   Functional Limitations Info  Sight, Hearing, Speech Sight Info: Adequate Hearing Info: Adequate Speech Info: Adequate    SPECIAL CARE FACTORS FREQUENCY                       Contractures Contractures Info: Not present    Additional Factors Info  Code Status, Allergies Code Status Info: full Allergies Info: Codeine, Morphine, Gabapentin, Stadol Butorphanol, Lyrica Pregabalin, Tapentadol, Tramadol           Current Medications (02/09/2019):  This is the current hospital active medication list Current Facility-Administered Medications  Medication Dose Route Frequency Provider Last Rate Last Dose  . 0.9 %  sodium chloride infusion   Intravenous Continuous Mayo, Allyn KennerKaty Dodd, MD 75 mL/hr at 02/09/19 0236    . acetaminophen (TYLENOL) tablet 650 mg  650 mg Oral Q6H PRN Mayo, Allyn KennerKaty Dodd, MD       Or  . acetaminophen (TYLENOL) suppository  650 mg  650 mg Rectal Q6H PRN Mayo, Allyn Kenner, MD      . albuterol (PROVENTIL) (2.5 MG/3ML) 0.083% nebulizer solution 2.5 mg  2.5 mg Nebulization Q4H PRN Mayo, Allyn Kenner, MD      . allopurinol (ZYLOPRIM) tablet 50 mg  50 mg Oral Q48H Mayo, Allyn Kenner, MD   50 mg at 02/08/19 0902  . atorvastatin (LIPITOR) tablet 20 mg  20 mg Oral QHS Mayo, Allyn Kenner, MD   20 mg at 02/08/19  2300  . budesonide (PULMICORT) nebulizer solution 0.5 mg  0.5 mg Nebulization BID Campbell Stall, MD   0.5 mg at 02/09/19 0724  . Chlorhexidine Gluconate Cloth 2 % PADS 6 each  6 each Topical Q0600 Altamese Dilling, MD   6 each at 02/09/19 [phone]  . feeding supplement (GLUCERNA SHAKE) (GLUCERNA SHAKE) liquid 237 mL  237 mL Oral TID BM Altamese Dilling, MD      . guaiFENesin-dextromethorphan (ROBITUSSIN DM) 100-10 MG/5ML syrup 5 mL  5 mL Oral Q4H PRN Mayo, Allyn Kenner, MD      . hydrALAZINE (APRESOLINE) injection 5 mg  5 mg Intravenous Q4H PRN Mayo, Allyn Kenner, MD      . insulin aspart (novoLOG) injection 0-5 Units  0-5 Units Subcutaneous QHS Mayo, Allyn Kenner, MD      . insulin aspart (novoLOG) injection 0-9 Units  0-9 Units Subcutaneous TID WC Mayo, Allyn Kenner, MD   2 Units at 02/08/19 1756  . ipratropium-albuterol (DUONEB) 0.5-2.5 (3) MG/3ML nebulizer solution 3 mL  3 mL Nebulization BID Mayo, Allyn Kenner, MD   3 mL at 02/09/19 0724  . metoprolol succinate (TOPROL-XL) 24 hr tablet 100 mg  100 mg Oral QHS Mayo, Allyn Kenner, MD   100 mg at 02/08/19 2300  . mirtazapine (REMERON) tablet 15 mg  15 mg Oral QHS Mayo, Allyn Kenner, MD   15 mg at 02/08/19 2300  . mupirocin ointment (BACTROBAN) 2 %   Nasal BID Altamese Dilling, MD      . ondansetron [facility]) tablet 4 mg  4 mg Oral Q6H PRN Mayo, Allyn Kenner, MD       Or  . ondansetron [facility]) injection 4 mg  4 mg Intravenous Q6H PRN Mayo, Allyn Kenner, MD      . sertraline (ZOLOFT) tablet 100 mg  100 mg Oral QHS Mayo, Allyn Kenner, MD   100 mg at 02/08/19 2300     Discharge Medications: Please see discharge summary for a list of discharge medications.  Relevant Imaging Results:  Relevant Lab Results:   Additional Information [id number]  Barrie Dunker, RN

## 2019-02-10 LAB — BASIC METABOLIC PANEL
Anion gap: 5 (ref 5–15)
BUN: 23 mg/dL (ref 8–23)
CO2: 28 mmol/L (ref 22–32)
Calcium: 8.9 mg/dL (ref 8.9–10.3)
Chloride: 109 mmol/L (ref 98–111)
Creatinine, Ser: 1.23 mg/dL (ref 0.61–1.24)
GFR calc Af Amer: >60 mL/min (ref >60)
GFR calc non Af Amer: 53 mL/min — ABNORMAL LOW (ref >60)
Glucose, Bld: 106 mg/dL — ABNORMAL HIGH (ref 70–99)
Potassium: 4.6 mmol/L (ref 3.5–5.1)
Sodium: 142 mmol/L (ref 135–145)

## 2019-02-10 LAB — CBC
HCT: 30.6 % — ABNORMAL LOW (ref 39.0–52.0)
Hemoglobin: 9 g/dL — ABNORMAL LOW (ref 13.0–17.0)
MCH: 24.3 pg — ABNORMAL LOW (ref 26.0–34.0)
MCHC: 29.4 g/dL — ABNORMAL LOW (ref 30.0–36.0)
MCV: 82.7 fL (ref 80.0–100.0)
Platelets: 164 10*3/uL (ref 150–400)
RBC: 3.7 MIL/uL — ABNORMAL LOW (ref 4.22–5.81)
RDW: 17.3 % — ABNORMAL HIGH (ref 11.5–15.5)
WBC: 8.1 10*3/uL (ref 4.0–10.5)
nRBC: 0.4 % — ABNORMAL HIGH (ref 0.0–0.2)

## 2019-02-10 LAB — GLUCOSE, CAPILLARY
Glucose-Capillary: 100 mg/dL — ABNORMAL HIGH (ref 70–99)
Glucose-Capillary: 109 mg/dL — ABNORMAL HIGH (ref 70–99)

## 2019-02-10 MED ORDER — NOVOLOG MIX 70/30 FLEXPEN (70-30) 100 UNIT/ML ~~LOC~~ SUPN: 25.0000 [IU] | PEN_INJECTOR | Freq: Two times a day (BID) | SUBCUTANEOUS | 0 refills | Status: DC

## 2019-02-10 MED ORDER — ORAL CARE MOUTH RINSE: 15.0000 mL | Freq: Two times a day (BID) | OROMUCOSAL | Status: DC

## 2019-02-10 NOTE — Progress Notes (Signed)
Discharge instructions reviewed with patient-IV site removed-waiting on EMS for transport home.

## 2019-02-10 NOTE — Discharge Instructions (Signed)
Bleeding Disorder A bleeding disorder causes abnormal bleeding or bruising. There are many kinds of bleeding disorders. They develop when the blood does not clump together (clot) properly. Normally, when you are injured and you bleed, special blood cells (platelets) and certain blood proteins (clotting factors) form a gel-like plug (blood clot). The clot forms at the site of the injury to help stop the bleeding. The injured blood vessel also tightens (constricts) to help stop bleeding. When you cannot form blood clots, it may be hard to stop bleeding, and even mild injuries can cause serious bleeding. Bleeding can result in you not having enough red blood cells (anemia). Sudden and severe bleeding can cause a dangerous loss of blood. What are the causes? There are many causes of bleeding disorders. A bleeding disorder may result from conditions that cause:  Too few or abnormal platelets.  Too few or abnormal clotting factors.  Abnormal or weak blood vessels that bleed easily and do not constrict normally. Bleeding disorders may be passed from parent to child (inherited), or they may develop on their own (acquired). Acquired bleeding disorders are most common. They can affect platelets, clotting factors, or blood vessels. Examples of acquired bleeding disorders include:  Liver diseases that affect clotting factors.  Allergic or immune diseases that attack blood vessels.  Infections that damage blood vessels.  Lack (deficiency) of vitamin C or vitamin K.  Bone marrow cancers that decrease platelet production.  Immune system diseases that attack platelets.  An enlarged spleen that traps platelets.  Long-term (chronic) kidney disease that decreases platelet function.  Disseminated intravascular coagulation (DIC). This condition is caused by severe diseases and conditions that use up platelets and clotting factors, such as an overwhelming infection, severe injury, or cancer.  Cancer  treatments that damage bone marrow, where blood cells are formed.  Medicines that increase bleeding. These include blood thinners, aspirin, NSAIDs, some antibiotics, some heart medicines, and quinine. Examples of inherited bleeding disorders include:  Von Willebrand disease (VWD).  Hemophilia.  Hemorrhagic telangiectasia.  Ehlers-Danlos syndrome. What are the signs or symptoms? Easy bruising and bleeding are the most common signs of a bleeding disorder. Minor cuts may bleed for a long time, and lightly bumping your skin may cause large bruises from bleeding under the skin (hematomas). Other symptoms include:  Heavy menstrual bleeding.  Frequent nosebleeds.  Prolonged bleeding from gums after brushing or flossing.  Blood in the stool (feces).  Prolonged bleeding after a dental procedure or a blood draw.  Anemia. This may cause pale skin, weakness, and fatigue.  Red spots or purple blotches under the skin.  Joint pain and swelling. How is this diagnosed? This condition may be diagnosed based on:  Your symptoms.  Your medical history, including: ? What medicines you are taking. ? Any procedures you have had, including surgery, childbirth, and dental procedures.  Your family history of bleeding disorders.  A physical exam. In some cases, a bleeding disorder is discovered during routine blood testing. You may be referred to a blood specialist (hematologist) for more tests, such as:  Complete blood count (CBC). This checks red blood cell and platelet levels.  Peripheral smear. This examines blood cells under a microscope and checks for abnormal blood cells.  PT (prothrombin time) and PTT (partial thromboplastin time) tests. These measure clotting times and test for clotting factors.  Imaging tests, such as a CT scan. These check for internal bleeding.  Genetic tests. These check for abnormal genes that you inherited. How is  this treated? Treatment for a bleeding  disorder depends on the cause, and may include:  Treating the cause of acquired bleeding disorders, such as immune system disorders, infections, or diseases. Treating the cause may reduce or reverse bleeding problems.  Changing or stopping medicines you take.  Taking vitamin supplements.  Receiving one or more of the following through an IV (IV infusion): ? Clotting factors. ? Plasma. Plasma is the liquid part of the blood that helps move platelets and clotting factors throughout the body. ? Platelets. ? Red blood cells, if you have severe blood loss.  Taking hormone medicine (desmopressin acetate, DDAVP) that increases certain clotting factors associated with bleeding disorders. There is no cure for inherited disorders, but treatment may prevent or control bleeding. Follow these instructions at home: Medicines  Take over-the-counter and prescription medicines only as told by your health care provider.  Talk with your health care provider before you take any new medicines. Certain medicines may increase your risk for dangerous bleeding. These include: ? Over-the-counter medicines that contain aspirin. ? NSAIDs such as ibuprofen. Preventing falls Follow instructions from your health care provider about ways that you can help prevent falls and injuries at home. These may include:  Removing loose rugs, cords, and other tripping hazards from walkways.  Installing grab bars in bathrooms.  Using night-lights. General instructions   Tell all your health care providers, including your dentist, that you have a bleeding disorder. Make sure to tell providers before you have any procedure done, including dental cleanings.  Limit activities as told by your health care provider. Ask your health care provider what activities are safe for you. You may need to avoid activities that could increase your risk of injury or bruising, such as contact sports.  Brush your teeth using a soft  toothbrush.  Use an electric razor to shave instead of a blade.  Wear a medical alert bracelet that says that you have a bleeding disorder. This can help you get the treatment you need in case of emergency.  Keep all follow-up visits as told by your health care provider. This is important. Contact a health care provider if you have:  Any symptoms of a bleeding disorder. Get help right away if you have:  Bleeding that does not stop.  Sudden, severe bleeding. Summary  A bleeding disorder causes abnormal bleeding or bruising. There many kinds of bleeding disorders.  Bleeding disorders may be passed from parent to child (inherited) or may develop on their own (acquired).  Easy bruising and bleeding are the most common signs of a bleeding disorder.  Treatment for a bleeding disorder depends on the cause.  Talk with your health care provider about what medicines and activities are safe for you. This information is not intended to replace advice given to you by your health care provider. Make sure you discuss any questions you have with your health care provider. Document Released: 08/06/2017 Document Revised: 08/06/2017 Document Reviewed: 08/06/2017 Elsevier Interactive Patient Education  2019 [employer] Bleeding Disorder A bleeding disorder causes abnormal bleeding or bruising. There are many kinds of bleeding disorders. They develop when the blood does not clump together (clot) properly. Normally, when you are injured and you bleed, special blood cells (platelets) and certain blood proteins (clotting factors) form a gel-like plug (blood clot). The clot forms at the site of the injury to help stop the bleeding. The injured blood vessel also tightens (constricts) to help stop bleeding. When you cannot form blood clots, it may  be hard to stop bleeding, and even mild injuries can cause serious bleeding. Bleeding can result in you not having enough red blood cells (anemia). Sudden and  severe bleeding can cause a dangerous loss of blood. What are the causes? There are many causes of bleeding disorders. A bleeding disorder may result from conditions that cause:  Too few or abnormal platelets.  Too few or abnormal clotting factors.  Abnormal or weak blood vessels that bleed easily and do not constrict normally. Bleeding disorders may be passed from parent to child (inherited), or they may develop on their own (acquired). Acquired bleeding disorders are most common. They can affect platelets, clotting factors, or blood vessels. Examples of acquired bleeding disorders include:  Liver diseases that affect clotting factors.  Allergic or immune diseases that attack blood vessels.  Infections that damage blood vessels.  Lack (deficiency) of vitamin C or vitamin K.  Bone marrow cancers that decrease platelet production.  Immune system diseases that attack platelets.  An enlarged spleen that traps platelets.  Long-term (chronic) kidney disease that decreases platelet function.  Disseminated intravascular coagulation (DIC). This condition is caused by severe diseases and conditions that use up platelets and clotting factors, such as an overwhelming infection, severe injury, or cancer.  Cancer treatments that damage bone marrow, where blood cells are formed.  Medicines that increase bleeding. These include blood thinners, aspirin, NSAIDs, some antibiotics, some heart medicines, and quinine. Examples of inherited bleeding disorders include:  Von Willebrand disease (VWD).  Hemophilia.  Hemorrhagic telangiectasia.  Ehlers-Danlos syndrome. What are the signs or symptoms? Easy bruising and bleeding are the most common signs of a bleeding disorder. Minor cuts may bleed for a long time, and lightly bumping your skin may cause large bruises from bleeding under the skin (hematomas). Other symptoms include:  Heavy menstrual bleeding.  Frequent nosebleeds.  Prolonged  bleeding from gums after brushing or flossing.  Blood in the stool (feces).  Prolonged bleeding after a dental procedure or a blood draw.  Anemia. This may cause pale skin, weakness, and fatigue.  Red spots or purple blotches under the skin.  Joint pain and swelling. How is this diagnosed? This condition may be diagnosed based on:  Your symptoms.  Your medical history, including: ? What medicines you are taking. ? Any procedures you have had, including surgery, childbirth, and dental procedures.  Your family history of bleeding disorders.  A physical exam. In some cases, a bleeding disorder is discovered during routine blood testing. You may be referred to a blood specialist (hematologist) for more tests, such as:  Complete blood count (CBC). This checks red blood cell and platelet levels.  Peripheral smear. This examines blood cells under a microscope and checks for abnormal blood cells.  PT (prothrombin time) and PTT (partial thromboplastin time) tests. These measure clotting times and test for clotting factors.  Imaging tests, such as a CT scan. These check for internal bleeding.  Genetic tests. These check for abnormal genes that you inherited. How is this treated? Treatment for a bleeding disorder depends on the cause, and may include:  Treating the cause of acquired bleeding disorders, such as immune system disorders, infections, or diseases. Treating the cause may reduce or reverse bleeding problems.  Changing or stopping medicines you take.  Taking vitamin supplements.  Receiving one or more of the following through an IV (IV infusion): ? Clotting factors. ? Plasma. Plasma is the liquid part of the blood that helps move platelets and clotting factors throughout the  body. ? Platelets. ? Red blood cells, if you have severe blood loss.  Taking hormone medicine (desmopressin acetate, DDAVP) that increases certain clotting factors associated with bleeding  disorders. There is no cure for inherited disorders, but treatment may prevent or control bleeding. Follow these instructions at home: Medicines  Take over-the-counter and prescription medicines only as told by your health care provider.  Talk with your health care provider before you take any new medicines. Certain medicines may increase your risk for dangerous bleeding. These include: ? Over-the-counter medicines that contain aspirin. ? NSAIDs such as ibuprofen. Preventing falls Follow instructions from your health care provider about ways that you can help prevent falls and injuries at home. These may include:  Removing loose rugs, cords, and other tripping hazards from walkways.  Installing grab bars in bathrooms.  Using night-lights. General instructions   Tell all your health care providers, including your dentist, that you have a bleeding disorder. Make sure to tell providers before you have any procedure done, including dental cleanings.  Limit activities as told by your health care provider. Ask your health care provider what activities are safe for you. You may need to avoid activities that could increase your risk of injury or bruising, such as contact sports.  Brush your teeth using a soft toothbrush.  Use an electric razor to shave instead of a blade.  Wear a medical alert bracelet that says that you have a bleeding disorder. This can help you get the treatment you need in case of emergency.  Keep all follow-up visits as told by your health care provider. This is important. Contact a health care provider if you have:  Any symptoms of a bleeding disorder. Get help right away if you have:  Bleeding that does not stop.  Sudden, severe bleeding. Summary  A bleeding disorder causes abnormal bleeding or bruising. There many kinds of bleeding disorders.  Bleeding disorders may be passed from parent to child (inherited) or may develop on their own (acquired).  Easy  bruising and bleeding are the most common signs of a bleeding disorder.  Treatment for a bleeding disorder depends on the cause.  Talk with your health care provider about what medicines and activities are safe for you. This information is not intended to replace advice given to you by your health care provider. Make sure you discuss any questions you have with your health care provider. Document Released: 08/06/2017 Document Revised: 08/06/2017 Document Reviewed: 08/06/2017 Elsevier Interactive Patient Education  2019 [employer]

## 2019-02-10 NOTE — Progress Notes (Signed)
Physical Therapy Treatment Patient Details Name: Gary Thomas MRN: [id number] DOB: [birth year] Today's Date: 02/10/2019    History of Present Illness 85 y.o. male with history of dementia, who had hip fracture after a fall in 08/2018 has not ambulated, cared of by his wife and caregiver at home since then.  Patient was seen at PCPs office due to worsening fatigue, poor p.o. intake, hemoglobin 5.8. PMH of ORIF of R upper condylar femur fx, underwent I&D and hardware removal 10/18/2018,  DM, HTN, pulmonary emboli, renal insufficiency (1kidney) stroke (R sided weakness)    PT Comments    Patient agreeable to PT this AM, no pain at rest. Pt able to mobilize to EOB with supervision, and able to  empty urostomy bag with supervision. Patient able to transfer to chair at bedside via lateral scoot, CGA-supervision, occasional cues for hand placement. Once up in the chair, pt was able to participate in sitting therapeutic exercises. Most challenged by seated chair pushups, able to completely clear 3x. Patient needed visual/verbal cues to maintain exercise form/technique. HR and spO2 checked throughout session, WFLs. The patient would benefit from further skilled PT to progress towards goals.    Follow Up Recommendations  Home health PT;Supervision/Assistance - 24 hour     Equipment Recommendations  Other (comment)    Recommendations for Other Services       Precautions / Restrictions Precautions Precautions: Fall Restrictions Weight Bearing Restrictions: No    Mobility  Bed Mobility Overal bed mobility: Needs Assistance Bed Mobility: Supine to Sit     Supine to sit: Supervision     General bed mobility comments: heavy use of bed rails, able to scoot anteriorly with use of bilateral UE. Supervision for safety.  Transfers Overall transfer level: Needs assistance   Transfers: Lateral/Scoot Transfers          Lateral/Scoot Transfers: Min guard General transfer comment: Scoot to R  side with dropped arm of recliner. CGA for safety, verbal cues for hand placement  Ambulation/Gait             General Gait Details: deferred, pt endorses that he has not walked in a very long time (confirmed with chart review)   Stairs             Wheelchair Mobility    Modified Rankin (Stroke Patients Only)       Balance Overall balance assessment: Needs assistance Sitting-balance support: Feet supported Sitting balance-Leahy Scale: Fair Sitting balance - Comments: Pt with improved weight shift this AM, able to utilize chair arms to clear one hip at a time to allow readjustment of sheets                                    Cognition Arousal/Alertness: Awake/alert Behavior During Therapy: WFL for tasks assessed/performed Overall Cognitive Status: No family/caregiver present to determine baseline cognitive functioning                                        Exercises Other Exercises Other Exercises: Patient able to perform 2x5 chair push ups. 3 times able to completely clear hips from chair. LAQ, seated marching, hip abduction, and heel/toe raises bilaterally x20 ea, verbal/visual cues to maintain form/technique    General Comments        Pertinent Vitals/Pain Pain Assessment: No/denies pain  Home Living                      Prior Function            PT Goals (current goals can now be found in the care plan section) Progress towards PT goals: Progressing toward goals    Frequency    Min 2X/week      PT Plan Current plan remains appropriate    Co-evaluation              AM-PAC PT "6 Clicks" Mobility   Outcome Measure  Help needed turning from your back to your side while in a flat bed without using bedrails?: A Little Help needed moving from lying on your back to sitting on the side of a flat bed without using bedrails?: A Little Help needed moving to and from a bed to a chair (including a  wheelchair)?: A Little Help needed standing up from a chair using your arms (e.g., wheelchair or bedside chair)?: Total Help needed to walk in hospital room?: Total Help needed climbing 3-5 steps with a railing? : Total 6 Click Score: 12    End of Session Equipment Utilized During Treatment: Gait belt Activity Tolerance: Patient tolerated treatment well Patient left: in chair;with chair alarm set Nurse Communication: Mobility status PT Visit Diagnosis: Unsteadiness on feet (R26.81);Muscle weakness (generalized) (M62.81);Repeated falls (R29.6);Difficulty in walking, not elsewhere classified (R26.2)     Time: [id number] PT Time Calculation (min) (ACUTE ONLY): 27 min  Charges:  $Therapeutic Exercise: 23-37 mins                     Olga Coasteriana Delmo Matty PT, DPT 11:13 AM,02/10/19 [phone]

## 2019-02-10 NOTE — Care Management Important Message (Signed)
Important Message  Patient Details  Name: Gary Thomas MRN: [id number] Date of Birth: 01/25/1932   Medicare Important Message Given:  Yes    [name] A Emiel Kielty 02/10/2019, 11:11 AM

## 2019-02-10 NOTE — TOC Transition Note (Signed)
Transition of Care [facility]) - CM/SW Discharge Note   Patient Details  Name: Gary Thomas MRN: [id number] Date of Birth: July 11, 1932  [facility] CM/SW Contact:  Virgel Manifold, RN Phone Number: 02/10/2019, 10:31 AM   Clinical Narrative: Patient to be discharged per MD order. Orders in place for home health services. Patient is active with [facility] and wishes to resume with them. Notified Rosey Bath of pending discharge. Spoke with spouse as well since it is noted that [facility] was working patient up for long term placement. It seems after medical treatment the patient has progressed significantly, so much so that out PT team recommended Home health. Spouse is hopeful to keep him at home but we will order SW at discharge. Orders are in place for a hospital bed but per the spouse they have a semi electric bed in place and they were looking to get a fully electric bed as it is difficult for her to "crank the head up". Per the spouse insurance has denied the bed in the past but they are potentially willing to pay out of price depending on how much it will be. Notified Brad from at [employer] and they have the bed but it would potentially be a price of $2200. Per spouse she will follow up depending on if they choose to seek long term care or continue on with home health. There is already a wheelchair, walker in the home. EMS will be used for transport.        Final next level of care: Home w Home Health Services Barriers to Discharge: No Barriers Identified   Patient Goals and CMS Choice Patient states their goals for this hospitalization and ongoing recovery are:: go home CMS Medicare.gov Compare Post Acute Care list provided to:: Patient Choice offered to / list presented to : Spouse  Discharge Placement                       Discharge Plan and Services   Discharge Planning Services: CM Consult Post Acute Care Choice: Home Health, Durable Medical Equipment(hospital bed)           DME Arranged: Hospital bed DME Agency: [employer] Date DME Agency Contacted: 02/08/19 Time DME Agency Contacted: [phone] Representative spoke with at DME Agency: Nida Boatman HH Arranged: RN, PT, Social Work [employer] Agency: [facility] [employer] [facility] [employer] Date HH Agency Contacted: 02/10/19 Time HH Agency Contacted: 1031 Representative spoke with at [facility] Agency: Rosey Bath  Social Determinants of Health (SDOH) Interventions     Readmission Risk Interventions Readmission Risk Prevention Plan 02/10/2019  Transportation Screening Complete  Medication Review [employer]) Complete  PCP or Specialist appointment within 3-5 days of discharge Complete  HRI or Home Care Consult Complete  SW Recovery Care/Counseling Consult Complete  Palliative Care Screening Not Applicable  Skilled Nursing Facility Patient Refused  Some recent data might be hidden

## 2019-02-15 NOTE — Discharge Summary (Signed)
[facility] [place] [facility]   PATIENT NAME: Gary Thomas    MR#:  [id number]  DATE OF BIRTH:  1932-06-03  DATE OF ADMISSION:  02/07/2019 ADMITTING PHYSICIAN: Campbell Stall, MD  DATE OF DISCHARGE: 02/10/2019  2:25 PM  PRIMARY CARE PHYSICIAN: Marguarite Arbour, MD    ADMISSION DIAGNOSIS:  Symptomatic anemia [D64.9]  DISCHARGE DIAGNOSIS:  Active Problems:   GI bleed   SECONDARY DIAGNOSIS:   Past Medical History:  Diagnosis Date  . Depression   . Diabetes mellitus without complication (HCC)   . Dyspnea   . Elevated lipids   . Femur fracture (HCC) 08/22/2018  . GERD (gastroesophageal reflux disease)   . Hypertension   . Pulmonary emboli (HCC)   . Renal insufficiency    only has 1 kidney  . Sleep apnea   . Stroke (HCC)    Rt side weakness    HOSPITAL COURSE:   GI bleed-in the setting of Xarelto use. May be hemorrhoidal bleed versus diverticular bleed. Patient is unsure when his last colonoscopy was. -Holding anticoagulation -GI consult appreciated. As patient is not actively bleeding and hemoglobin is stable plus patient her multiple medical problems, GI suggested no urgent need for procedures and continue to monitor.  I would advised to stop Xarelto. Patient was also using BC powders at least 1-2 times a day.  I have strongly advised against using that and take Tylenol if he have to for his joint pains.  Acute blood loss anemia-due to above. Hemoglobin 5.8 -Hemoglobin 7.2 after 2 units of PRBC. - given 1 more unit - Hb stable now.  AKI-CKD stage III Baseline creatinine 1.4 to in January 2020 likely due to dehydration and poor p.o. intake. -IV fluids -Holding home losartan and Lasix -Avoid nephrotoxic agents -Recheck creatinine in the morning  Leukocytosis-improved today. Chest x-ray, respiratory viral panel, urinalysis are all negative.  No need for antibiotics.  Hypertension-blood pressures stable now. -Holding  home losartan -Continue home metoprolol -IVhydralazine PRN  Type 2 diabetes-blood sugars elevated in the ED -SSI  Generalized weakness-patient has been unable to care for himself at home since his recent femur fracture. -PT consult- home health advised.  DISCHARGE CONDITIONS:   Stable.  CONSULTS OBTAINED:    DRUG ALLERGIES:   Allergies  Allergen Reactions  . Codeine Anaphylaxis  . Morphine Anaphylaxis  . Gabapentin Other (See Comments)    Unsure of what the reaction was   . Stadol [Butorphanol]     Unknown  . Lyrica [Pregabalin] Rash  . Tapentadol Nausea Only    ONSET 01/04/2013  . Tramadol     Hallucinations "I see giant spiders on ceiling". Pt also believes he is in [facility], but he is in [place].    DISCHARGE MEDICATIONS:   Allergies as of 02/10/2019      Reactions   Codeine Anaphylaxis   Morphine Anaphylaxis   Gabapentin Other (See Comments)   Unsure of what the reaction was   Stadol [butorphanol]    Unknown   Lyrica [pregabalin] Rash   Tapentadol Nausea Only   ONSET 01/04/2013   Tramadol    Hallucinations "I see giant spiders on ceiling". Pt also believes he is in [facility], but he is in [place].      Medication List    STOP taking these medications   losartan 25 MG tablet Commonly known as:  COZAAR   predniSONE 10 MG tablet Commonly known as:  DELTASONE   traMADol 50 MG  tablet Commonly known as:  ULTRAM   Xarelto 15 MG Tabs tablet Generic drug:  Rivaroxaban     TAKE these medications   acetaminophen 325 MG tablet Commonly known as:  TYLENOL Take 2 tablets (650 mg total) by mouth every 6 (six) hours as needed for mild pain (or Fever >/= 101).   albuterol (2.5 MG/3ML) 0.083% nebulizer solution Commonly known as:  PROVENTIL Take 3 mLs (2.5 mg total) by nebulization every 4 (four) hours as needed for wheezing or shortness of breath.   allopurinol 100 MG tablet Commonly known as:  ZYLOPRIM Take 0.5 tablets (50 mg total) by mouth every other  day.   atorvastatin 20 MG tablet Commonly known as:  LIPITOR Take 1 tablet (20 mg total) by mouth at bedtime.   budesonide 0.5 MG/2ML nebulizer solution Commonly known as:  PULMICORT Take 2 mLs (0.5 mg total) by nebulization 2 (two) times daily.   Cholecalciferol 100 MCG (4000 UT) Caps Take 1 capsule by mouth daily.   DERMACLOUD EX Apply liberal amount topically as needed to area of skin irritation.  Ok to leave at bedside   ferrous sulfate 325 (65 FE) MG EC tablet Take 1 tablet (325 mg total) by mouth 3 (three) times daily with meals.   furosemide 40 MG tablet Commonly known as:  LASIX Take 1 tablet (40 mg total) by mouth at bedtime.   HYDROcodone-acetaminophen 5-325 MG tablet Commonly known as:  NORCO/VICODIN Take 1 tablet by mouth every 6 (six) hours as needed for severe pain.   insulin aspart 100 UNIT/ML injection Commonly known as:  novoLOG Inject 0-5 Units into the skin at bedtime. Correction coverage: HS scale CBG<70: Implement hypoglycemia protocol. CBG 70-120: 0 Units, CBG 121-150: 0 Units, CBG 151-200: 0 Units, CBG 201-250: 2 Units, CBG 251-300: 3 Units, CBG 301-350: 4 Units, CBG 351-400: 5 Units, CBG >400: Call MD and obtain STAT lab verification.   ipratropium-albuterol 0.5-2.5 (3) MG/3ML Soln Commonly known as:  DUONEB Take 3 mLs by nebulization 2 (two) times daily.   loperamide 2 MG tablet Commonly known as:  IMODIUM A-D Take 2 mg by mouth 4 (four) times daily as needed for diarrhea or loose stools.   metoprolol succinate 100 MG 24 hr tablet Commonly known as:  TOPROL-XL Take 1 tablet (100 mg total) by mouth at bedtime.   mirtazapine 15 MG tablet Commonly known as:  REMERON Take 15 mg by mouth at bedtime.   multivitamin-lutein Caps capsule Take 1 capsule by mouth daily.   NON FORMULARY Diet Type: NAS. NCS   NovoLOG Mix 70/30 FlexPen (70-30) 100 UNIT/ML FlexPen Generic drug:  insulin aspart protamine - aspart Inject 0.25 mLs (25 Units total) into  the skin 2 (two) times daily with a meal.   nystatin powder Commonly known as:  MYCOSTATIN/NYSTOP Apply topically as directed. Apply with each ostomy change as instructed   omeprazole 40 MG capsule Commonly known as:  PRILOSEC Take 1 capsule (40 mg total) by mouth daily.   sertraline 100 MG tablet Commonly known as:  ZOLOFT Take 1 tablet (100 mg total) by mouth at bedtime.   tiotropium 18 MCG inhalation capsule Commonly known as:  SPIRIVA Place 18 mcg into inhaler and inhale daily.        DISCHARGE INSTRUCTIONS:    Follow with PMD in 1-2 weeks.  If you experience worsening of your admission symptoms, develop shortness of breath, life threatening emergency, suicidal or homicidal thoughts you must seek medical attention immediately by calling 911  or calling your MD immediately  if symptoms less severe.  You Must read complete instructions/literature along with all the possible adverse reactions/side effects for all the Medicines you take and that have been prescribed to you. Take any new Medicines after you have completely understood and accept all the possible adverse reactions/side effects.   Please note  You were cared for by a hospitalist during your hospital stay. If you have any questions about your discharge medications or the care you received while you were in the hospital after you are discharged, you can call the unit and asked to speak with the hospitalist on call if the hospitalist that took care of you is not available. Once you are discharged, your primary care physician will handle any further medical issues. Please note that NO REFILLS for any discharge medications will be authorized once you are discharged, as it is imperative that you return to your primary care physician (or establish a relationship with a primary care physician if you do not have one) for your aftercare needs so that they can reassess your need for medications and monitor your lab  values.    Today   CHIEF COMPLAINT:   Chief Complaint  Patient presents with  . Abnormal Lab    HISTORY OF PRESENT ILLNESS:  Gary Thomas  is a 85 y.o. male with a known history of hypertension, type 2 diabetes, history of stroke with residual right-sided weakness, OSA, recent femur fracture, hyperlipidemia, depression, CKD 3, history of PE on Xarelto who was sent to the ED from his PCPs office due to low hemoglobin.  He was seen today in his PCPs office with weakness and fatigue.  He has been unable to ambulate at home and family has been having a hard time caring for him at home.  He denies any chest pain, shortness of breath, palpitations.  In the ED, he was mildly hypertensive.  Labs are significant for hemoglobin 5.8, creatinine 2.16, WBC 16.1.  Stool guaiac was positive.  Hospitalists were called for admission.   VITAL SIGNS:  Blood pressure (!) 143/54, pulse 80, temperature 98.3 F (36.8 C), temperature source Oral, resp. rate 18, height 5\' 9"  (1.753 m), weight 99.8 kg, SpO2 (!) 89 %.  I/O:  No intake or output data in the 24 hours ending 02/15/19 0832  PHYSICAL EXAMINATION:  GENERAL:  85 y.o.-year-old patient lying in the bed with no acute distress.  EYES: Pupils equal, round, reactive to light and accommodation. No scleral icterus. Extraocular muscles intact.  Patient appears very pale. HEENT: Head atraumatic, normocephalic. Oropharynx and nasopharynx clear.  NECK:  Supple, no jugular venous distention. No thyroid enlargement, no tenderness.  LUNGS: Normal breath sounds bilaterally, no wheezing, rales,rhonchi or crepitation. No use of accessory muscles of respiration.  CARDIOVASCULAR: S1, S2 normal. No murmurs, rubs, or gallops.  ABDOMEN: Soft, nontender, nondistended. Bowel sounds present. No organomegaly or mass.  EXTREMITIES: No pedal edema, cyanosis, or clubbing.  NEUROLOGIC: Cranial nerves II through XII are intact. Muscle strength 4/5 in all extremities. Sensation  intact. Gait not checked.  PSYCHIATRIC: The patient is alert and oriented x 3.  SKIN: No obvious rash, lesion, or ulcer.   DATA REVIEW:   CBC Recent Labs  Lab 02/10/19 0405  WBC 8.1  HGB 9.0*  HCT 30.6*  PLT 164    Chemistries  Recent Labs  Lab 02/10/19 0405  NA 142  K 4.6  CL 109  CO2 28  GLUCOSE 106*  BUN 23  CREATININE  1.23  CALCIUM 8.9    Cardiac Enzymes No results for input(s): TROPONINI in the last 168 hours.  Microbiology Results  Results for orders placed or performed during the hospital encounter of 02/07/19  Respiratory Panel by PCR     Status: None   Collection Time: 02/08/19  1:33 AM  Result Value Ref Range Status   Adenovirus NOT DETECTED NOT DETECTED Final   Coronavirus 229E NOT DETECTED NOT DETECTED Final    Comment: (NOTE) The Coronavirus on the Respiratory Panel, DOES NOT test for the novel  Coronavirus (2019 nCoV)    Coronavirus HKU1 NOT DETECTED NOT DETECTED Final   Coronavirus NL63 NOT DETECTED NOT DETECTED Final   Coronavirus OC43 NOT DETECTED NOT DETECTED Final   Metapneumovirus NOT DETECTED NOT DETECTED Final   Rhinovirus / Enterovirus NOT DETECTED NOT DETECTED Final   Influenza A NOT DETECTED NOT DETECTED Final   Influenza B NOT DETECTED NOT DETECTED Final   Parainfluenza Virus 1 NOT DETECTED NOT DETECTED Final   Parainfluenza Virus 2 NOT DETECTED NOT DETECTED Final   Parainfluenza Virus 3 NOT DETECTED NOT DETECTED Final   Parainfluenza Virus 4 NOT DETECTED NOT DETECTED Final   Respiratory Syncytial Virus NOT DETECTED NOT DETECTED Final   Bordetella pertussis NOT DETECTED NOT DETECTED Final   Chlamydophila pneumoniae NOT DETECTED NOT DETECTED Final   Mycoplasma pneumoniae NOT DETECTED NOT DETECTED Final    Comment: Performed at [facility], [address] [place], [place] [address]  MRSA PCR Screening     Status: Abnormal   Collection Time: 02/08/19  5:02 AM  Result Value Ref Range Status   MRSA by PCR POSITIVE (A)  NEGATIVE Final    Comment:        The GeneXpert MRSA Assay (FDA approved for NASAL specimens only), is one component of a comprehensive MRSA colonization surveillance program. It is not intended to diagnose MRSA infection nor to guide or monitor treatment for MRSA infections. RESULT CALLED TO, READ BACK BY AND VERIFIED WITH:  [facility] CROSS AT 6045 02/08/19 SDR Performed at [facility], [address] [place], [place] [address]     RADIOLOGY:  No results found.  EKG:   Orders placed or performed during the hospital encounter of 02/07/19  . EKG 12-Lead  . EKG 12-Lead      Management plans discussed with the patient, family and they are in agreement.  CODE STATUS:  Code Status History    Date Active Date Inactive Code Status Order ID Comments User Context   02/08/2019 0010 02/10/2019 1801 Full Code [id number]  Campbell Stall, MD Inpatient   10/19/2018 1055 10/21/2018 2322 Full Code [id number]  Milagros Loll, MD Inpatient   10/18/2018 2058 10/19/2018 1055 DNR [id number]  Milagros Loll, MD Inpatient   10/18/2018 1925 10/18/2018 2057 Full Code [id number]  Christena Flake, MD Inpatient   10/17/2018 1423 10/18/2018 1826 Full Code [id number]  Christena Flake, MD Inpatient   10/06/2018 1640 10/10/2018 1849 Full Code [id number]  Katha Hamming, MD ED   08/28/2018 1334 08/30/2018 1711 Full Code [id number]  Houston Siren, MD Inpatient   08/23/2018 0125 08/26/2018 2201 Full Code [id number]  Cammy Copa, MD Inpatient   02/15/2018 2012 02/21/2018 1700 Full Code [id number]  Maeola Harman, MD Inpatient   03/02/2017 1938 03/05/2017 1439 Full Code [id number]  Kennedy Bucker, MD Inpatient      TOTAL TIME TAKING CARE OF THIS PATIENT: 35 minutes.    Altamese Dilling M.D on 02/15/2019  at 8:32 AM  Between 7am to 6pm - Pager - [phone]  After 6pm go to [email or web address] - password [email or web address]  [facility]  Office  [phone]  CC: Primary care physician; Marguarite Arbour,  MD   Note: This dictation was prepared with Dragon dictation along with smaller phrase technology. Any transcriptional errors that result from this process are unintentional.

## 2019-07-10 ENCOUNTER — Ambulatory Visit (INDEPENDENT_AMBULATORY_CARE_PROVIDER_SITE_OTHER): Payer: Medicare Other | Admitting: Urology

## 2019-07-10 ENCOUNTER — Ambulatory Visit: Payer: Medicare Other | Admitting: Urology

## 2019-07-10 ENCOUNTER — Other Ambulatory Visit: Payer: Self-pay

## 2019-07-10 ENCOUNTER — Encounter: Payer: Self-pay | Admitting: Urology

## 2019-07-10 VITALS — BP 120/77 | HR 69 | Ht 69.0 in

## 2019-07-10 DIAGNOSIS — R319 Hematuria, unspecified: Secondary | ICD-10-CM

## 2019-07-10 DIAGNOSIS — R310 Gross hematuria: Secondary | ICD-10-CM | POA: Diagnosis not present

## 2019-07-10 MED ORDER — CIPROFLOXACIN HCL 250 MG PO TABS: 250.0000 mg | ORAL_TABLET | Freq: Two times a day (BID) | ORAL | 0 refills | Status: DC

## 2019-07-10 NOTE — Progress Notes (Signed)
07/10/2019 2:48 PM   Gary Thomas 12-09-1931 [id number]  Referring provider: Marguarite Arbour, MD [address] [facility] [place],  [place] [address]  No chief complaint on file.   HPI: Patient's history was a little bit difficult to sort out but he has an ileal conduit urinary diversion reportedly for urinary incontinence following prostate cancer surgery years ago.  In the last 3 weeks he is noticed some spotting on his underwear from the penis.  The urine in the bag is clear.  He saw Dr. Richardo Hanks last year for possible bladder infection in consultation.  He is not having any fever and he has nonspecific low-grade back pain.  He reports 1 poorly functioning kidney.  I reviewed the urology consultation note from a year ago.  The patient has obvious multiple medical comorbidities.  He has a solitary right kidney of reported 38% function by the patient.  The left kidney is atrophic.  Baseline creatinine at that time report is 1.8.  Urine culture from a year ago was normal and he has not had a recent renal x-ray.  Modifying factors: There are no other modifying factors  Associated signs and symptoms: There are no other associated signs and symptoms Aggravating and relieving factors: There are no other aggravating or relieving factors Severity: Moderate Duration: Persistent     PMH: Past Medical History:  Diagnosis Date  . Depression   . Diabetes mellitus without complication (HCC)   . Dyspnea   . Elevated lipids   . Femur fracture (HCC) 08/22/2018  . GERD (gastroesophageal reflux disease)   . Hypertension   . Pulmonary emboli (HCC)   . Renal insufficiency    only has 1 kidney  . Sleep apnea   . Stroke (HCC)    Rt side weakness    Surgical History: Past Surgical History:  Procedure Laterality Date  . 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 AORTA SURGERY    . CAROTID ARTERY - SUBCLAVIAN ARTERY BYPASS GRAFT  Bilateral   . CATARACT EXTRACTION W/ INTRAOCULAR LENS  IMPLANT, BILATERAL    . CHOLECYSTECTOMY    . EYE SURGERY    . FEMUR FRACTURE SURGERY Right   . HIP FRACTURE SURGERY Bilateral   . I&D EXTREMITY Right 10/18/2018   Procedure: IRRIGATION AND DEBRIDEMENT EXTREMITY, POSSIBLE HARDWARE REMOVAL OF SUPERCONDYLAR PLATE;  Surgeon: Christena Flake, MD;  Location: [facility] ORS;  Service: Orthopedics;  Laterality: Right;  . JOINT REPLACEMENT    . ORIF FEMUR FRACTURE Right 08/24/2018   Procedure: OPEN REDUCTION INTERNAL FIXATION (ORIF) SUPRACONDYLAR FEMUR FRACTURE;  Surgeon: Christena Flake, MD;  Location: [facility];  Service: Orthopedics;  Laterality: Right;  . ORIF FEMUR FRACTURE Right 10/18/2018   Procedure: HARDWARE REMOVAL- DISTAL FEMUR FRACTURE FIXATION;  Surgeon: Christena Flake, MD;  Location: [facility] OR[facility];  Service: Orthopedics;  Laterality: Right;  . PROSTATE SURGERY    . REPLACEMENT TOTAL KNEE Right   . TOTAL HIP ARTHROPLASTY Right 03/02/2017   Procedure: TOTAL HIP ARTHROPLASTY ANTERIOR APPROACH;  Surgeon: Kennedy Bucker, MD;  Location: [facility];  Service: Orthopedics;  Laterality: Right;  [facility] VASCULAR SURGERY      Home Medications:  Allergies as of 07/10/2019      Reactions   Codeine Anaphylaxis   Morphine Anaphylaxis   Gabapentin Other (See Comments)   Unsure of what the reaction was   Stadol [butorphanol]    Unknown   Lyrica [pregabalin] Rash  Tapentadol Nausea Only   ONSET 01/04/2013   Tramadol    Hallucinations "I see giant spiders on ceiling". [place] [facility] [place] [facility] [place] but he [place].      Medication List       Accurate as of July 10, 2019  2:48 PM. If you have any questions, ask your nurse or doctor.        STOP taking these medications   ferrous sulfate 325 (65 FE) MG EC tablet Stopped by: Martina SinnerScott A Kamir Selover, MD   HYDROcodone-acetaminophen 5-325 MG tablet Commonly known as: NORCO/VICODIN Stopped by: Martina SinnerScott A Sixto Bowdish, MD     TAKE these medications    acetaminophen 325 MG tablet Commonly known as: TYLENOL Take 2 tablets (650 mg total) by mouth every 6 (six) hours as needed for mild pain (or Fever >/= 101).   albuterol (2.5 MG/3ML) 0.083% nebulizer solution Commonly known as: PROVENTIL Take 3 mLs (2.5 mg total) by nebulization every 4 (four) hours as needed for wheezing or shortness of breath.   allopurinol 100 MG tablet Commonly known as: ZYLOPRIM Take 0.5 tablets (50 mg total) by mouth every other day.   atorvastatin 20 MG tablet Commonly known as: LIPITOR Take 1 tablet (20 mg total) by mouth at bedtime.   budesonide 0.5 MG/2ML nebulizer solution Commonly known as: PULMICORT Take 2 mLs (0.5 mg total) by nebulization 2 (two) times daily.   Cholecalciferol 100 MCG (4000 UT) Caps Take 1 capsule by mouth daily.   DERMACLOUD EX Apply liberal amount topically as needed to area of skin irritation.  Ok to leave at bedside   furosemide 40 MG tablet Commonly known as: LASIX Take 1 tablet (40 mg total) by mouth at bedtime.   insulin aspart 100 UNIT/ML injection Commonly known as: novoLOG Inject 0-5 Units into the skin at bedtime. Correction coverage: HS scale CBG<70: Implement hypoglycemia protocol. CBG 70-120: 0 Units, CBG 121-150: 0 Units, CBG 151-200: 0 Units, CBG 201-250: 2 Units, CBG 251-300: 3 Units, CBG 301-350: 4 Units, CBG 351-400: 5 Units, CBG >400: Call MD and obtain STAT lab verification.   ipratropium-albuterol 0.5-2.5 (3) MG/3ML Soln Commonly known as: DUONEB Take 3 mLs by nebulization 2 (two) times daily.   loperamide 2 MG tablet Commonly known as: IMODIUM A-D Take 2 mg by mouth 4 (four) times daily as needed for diarrhea or loose stools.   metoprolol succinate 100 MG 24 hr tablet Commonly known as: TOPROL-XL Take 1 tablet (100 mg total) by mouth at bedtime.   mirtazapine 15 MG tablet Commonly known as: REMERON Take 15 mg by mouth at bedtime.   multivitamin-lutein Caps capsule Take 1 capsule by mouth daily.    NON FORMULARY Diet Type: NAS. NCS   NovoLOG Mix 70/30 FlexPen (70-30) 100 UNIT/ML FlexPen Generic drug: insulin aspart protamine - aspart Inject 0.25 mLs (25 Units total) into the skin 2 (two) times daily with a meal.   nystatin powder Commonly known as: MYCOSTATIN/NYSTOP Apply topically as directed. Apply with each ostomy change as instructed   omeprazole 40 MG capsule Commonly known as: PRILOSEC Take 1 capsule (40 mg total) by mouth daily.   sertraline 100 MG tablet Commonly known as: ZOLOFT Take 1 tablet (100 mg total) by mouth at bedtime.   tiotropium 18 MCG inhalation capsule Commonly known as: SPIRIVA Place 18 mcg into inhaler and inhale daily.       Allergies:  Allergies  Allergen Reactions  . Codeine Anaphylaxis  . Morphine Anaphylaxis  .  Gabapentin Other (See Comments)    Unsure of what the reaction was   . Stadol [Butorphanol]     Unknown  . Lyrica [Pregabalin] Rash  . Tapentadol Nausea Only    ONSET 01/04/2013  . Tramadol     Hallucinations "I see giant spiders on ceiling". Pt also believes he is in [facility], but he is in [place].    Family History: Family History  Problem Relation Age of Onset  . Heart attack Father   . Stroke Father   . Heart disease Father   . Heart attack Mother     Social History:  reports that he quit smoking about 67 years ago. His smoking use included cigarettes. He has a 7.00 pack-year smoking history. He has quit using smokeless tobacco. He reports that he does not drink alcohol or use drugs.  ROS: UROLOGY Frequent Urination?: No Hard to postpone urination?: No Burning/pain with urination?: No Get up at night to urinate?: No Leakage of urine?: No Urine stream starts and stops?: No Trouble starting stream?: No Do you have to strain to urinate?: No Blood in urine?: No Urinary tract infection?: No Sexually transmitted disease?: No Injury to kidneys or bladder?: No Painful intercourse?: No Weak stream?: No Erection  problems?: No Penile pain?: No  Gastrointestinal Nausea?: No Vomiting?: No Indigestion/heartburn?: No Diarrhea?: No Constipation?: No  Constitutional Fever: No Night sweats?: No Weight loss?: No Fatigue?: No  Skin Skin rash/lesions?: No Itching?: No  Eyes Blurred vision?: No Double vision?: No  Ears/Nose/Throat Sore throat?: No Sinus problems?: No  Hematologic/Lymphatic Swollen glands?: No Easy bruising?: No  Cardiovascular Leg swelling?: No Chest pain?: No  Respiratory Cough?: No Shortness of breath?: No  Endocrine Excessive thirst?: No  Musculoskeletal Back pain?: No Joint pain?: No  Neurological Headaches?: No Dizziness?: No  Psychologic Depression?: No Anxiety?: No  Physical Exam: BP 120/77   Pulse 69   Ht  (1.753 m)   BMI 32.49 kg/m   Constitutional:  Alert and oriented, No acute distress. HEENT: [place] AT, moist mucus membranes.  Trachea midline, no masses. Cardiovascular: No clubbing, cyanosis, or edema. Respiratory: Normal respiratory effort, no increased work of breathing. GI: Abdomen is soft, nontender, nondistended, no abdominal masses GU: No CVA tenderness.  Clear urine from ostomy Skin: No rashes, bruises or suspicious lesions. Lymph: No cervical or inguinal adenopathy. Neurologic: Grossly intact, no focal deficits, moving all 4 extremities. Psychiatric: Normal mood and affect.  Laboratory Data: Lab Results  Component Value Date   WBC 8.1 02/10/2019   HGB 9.0 (L) 02/10/2019   HCT 30.6 (L) 02/10/2019   MCV 82.7 02/10/2019   PLT 164 02/10/2019    Lab Results  Component Value Date   CREATININE 1.23 02/10/2019    No results found for: PSA  No results found for: TESTOSTERONE  Lab Results  Component Value Date   HGBA1C 5.2 02/07/2018    Urinalysis    Component Value Date/Time   COLORURINE YELLOW (A) 02/07/2019 0215   APPEARANCEUR HAZY (A) 02/07/2019 0215   LABSPEC 1.015 02/07/2019 0215   PHURINE 8.0  02/07/2019 0215   GLUCOSEU NEGATIVE 02/07/2019 0215   HGBUR NEGATIVE 02/07/2019 0215   BILIRUBINUR NEGATIVE 02/07/2019 0215   KETONESUR NEGATIVE 02/07/2019 0215   PROTEINUR 30 (A) 02/07/2019 0215   NITRITE NEGATIVE 02/07/2019 0215   LEUKOCYTESUR SMALL (A) 02/07/2019 0215    Pertinent Imaging:   Assessment & Plan: The patient has a complex presentation and I thought it was reasonable to get  a CT stone protocol since the blood is not involving the ostomy and his renal insufficiency.  I will bring him back to see Dr. Diamantina Providence to perform cystoscopy.  I thought it was reasonable to empirically give him an antibiotic for any possible low-grade cystitis in an otherwise bladder that does not void spontaneously.  With his cancer history I think cystoscopy is prudent.  The CT stone protocol could shed light on the right kidney and to some extent the bladder.  Sometimes he gets foul-smelling secretion from the meatus and I called in ciprofloxacin 2 and 50 mg twice a day for 1 week and hopefully this will be beneficial  1. Hematuria, unspecified type  - Urinalysis, Complete   No follow-ups on file.  Reece Packer, MD  [facility] [address] [place], [place] [address] [phone]

## 2019-07-20 ENCOUNTER — Other Ambulatory Visit: Payer: Medicare Other | Admitting: Urology

## 2019-07-28 ENCOUNTER — Ambulatory Visit
Admission: RE | Admit: 2019-07-28 | Discharge: 2019-07-28 | Disposition: A | Discharge status: Home / Self Care | Payer: Medicare Other | Source: Ambulatory Visit | Attending: Urology | Admitting: Urology

## 2019-07-28 ENCOUNTER — Other Ambulatory Visit: Payer: Self-pay

## 2019-07-28 DIAGNOSIS — R319 Hematuria, unspecified: Secondary | ICD-10-CM | POA: Diagnosis present

## 2019-07-31 ENCOUNTER — Ambulatory Visit (INDEPENDENT_AMBULATORY_CARE_PROVIDER_SITE_OTHER): Payer: Medicare Other | Admitting: Urology

## 2019-07-31 ENCOUNTER — Encounter: Payer: Self-pay | Admitting: Urology

## 2019-07-31 ENCOUNTER — Other Ambulatory Visit: Payer: Self-pay

## 2019-07-31 VITALS — BP 161/79 | HR 66 | Ht 69.0 in | Wt 230.0 lb

## 2019-07-31 DIAGNOSIS — R310 Gross hematuria: Secondary | ICD-10-CM

## 2019-07-31 NOTE — Progress Notes (Signed)
Cystoscopy Procedure Note:  Indication: Hematuria per urethra, history of ileal conduit diversion for incontinence after prostatectomy(PSA undetectable 05/2019)  After informed consent and discussion of the procedure and its risks, Gary Thomas was positioned and prepped in the standard fashion.  The meatus was ventral from either prior chronic Foley or hypospadias.  Cystoscopy was performed with a flexible cystoscope. The urethra, bladder neck and entire bladder was visualized in a standard fashion. The prostate was surgically absent.  The mucosa was grossly normal throughout with no abnormal mucosal lesions.  No abnormalities on retroflexion.  Cytology was sent.  Imaging: CT stone protocol 07/28/2019 personally reviewed.  Marked stable left renal atrophy, no evidence of lymphadenopathy, prostate surgically absent  Findings: Surgically absent prostate, no abnormal bladder findings  Assessment and Plan: We will call with cytology Follow-up as needed or if worsening bleeding  Nickolas Madrid, MD 07/31/2019

## 2019-08-02 ENCOUNTER — Telehealth: Payer: Self-pay | Admitting: Urology

## 2019-08-02 NOTE — Telephone Encounter (Signed)
Bill from [facility] lab called and states that he received UA specimen from pt and it was not marked.

## 2019-08-03 NOTE — Telephone Encounter (Signed)
Test was changed

## 2019-08-08 ENCOUNTER — Other Ambulatory Visit: Payer: Self-pay | Admitting: Urology

## 2019-08-08 ENCOUNTER — Telehealth: Payer: Self-pay

## 2019-08-08 NOTE — Telephone Encounter (Signed)
-----   Message from Billey Co, MD sent at 08/08/2019 10:07 AM EDT ----- Regarding: cytology result No abnormal cells on cytology, good news- follow up as needed  Nickolas Madrid, MD 08/08/2019   ----- Message ----- From: Delon [place] D Sent: 08/08/2019   9:15 AM EDT To: Billey Co, MD

## 2019-08-08 NOTE — Telephone Encounter (Signed)
Patient notified

## 2019-09-18 ENCOUNTER — Telehealth: Payer: Self-pay | Admitting: Urology

## 2019-09-18 ENCOUNTER — Encounter: Payer: Self-pay | Admitting: Urology

## 2019-09-18 ENCOUNTER — Other Ambulatory Visit: Payer: Self-pay

## 2019-09-18 ENCOUNTER — Other Ambulatory Visit
Admission: RE | Admit: 2019-09-18 | Discharge: 2019-09-18 | Disposition: A | Discharge status: Home / Self Care | Payer: Medicare Other | Source: Ambulatory Visit | Attending: Urology | Admitting: Urology

## 2019-09-18 ENCOUNTER — Ambulatory Visit (INDEPENDENT_AMBULATORY_CARE_PROVIDER_SITE_OTHER): Payer: Medicare Other | Admitting: Urology

## 2019-09-18 VITALS — BP 150/82 | Ht 69.0 in | Wt 230.0 lb

## 2019-09-18 DIAGNOSIS — R310 Gross hematuria: Secondary | ICD-10-CM | POA: Diagnosis present

## 2019-09-18 LAB — URINALYSIS, COMPLETE (UACMP) WITH MICROSCOPIC
Bacteria, UA: NONE SEEN
Bilirubin Urine: NEGATIVE
Glucose, UA: NEGATIVE mg/dL
Ketones, ur: NEGATIVE mg/dL
Nitrite: NEGATIVE
Protein, ur: 30 mg/dL — AB
Specific Gravity, Urine: 1.02 (ref 1.005–1.030)
Squamous Epithelial / HPF: NONE SEEN (ref 0–5)
pH: 6.5 (ref 5.0–8.0)

## 2019-09-18 MED ORDER — FLUCONAZOLE 100 MG PO TABS: 100.0000 mg | ORAL_TABLET | Freq: Every day | ORAL | 0 refills | Status: DC

## 2019-09-18 NOTE — Telephone Encounter (Signed)
Patient notified and voiced understanding. Diflucan was sent to [facility].

## 2019-09-18 NOTE — Telephone Encounter (Signed)
Left pt mess to call 

## 2019-09-18 NOTE — Telephone Encounter (Signed)
Will you let Gary Thomas know that we have reached out to an urologist in [place] who performs cystectomies and he has recommended showering and then applying OTC Desitin for 30 minutes twice daily and then wiping the Desitin off and applying the appliance.  He also recommended Diflucan 100 mg daily x 10 days.  What pharmacy would he like [place] to send the diflucan?  We are also still waiting on [facility] to call us tomorrow for their suggestions.

## 2019-09-18 NOTE — Telephone Encounter (Signed)
Patient states bleeding and puss in bag and itching and swelling around stoma started a few days ago. Patient was added on to schedule with Zara Council, PAC

## 2019-09-18 NOTE — Telephone Encounter (Signed)
Pt called and states that he is having swelling and irritation with his Urological Stoma. He would like to be seen if possible. Please advise.

## 2019-09-18 NOTE — Progress Notes (Signed)
09/18/2019 3:05 PM   Gary Thomas [birth year] [id number]  Referring provider: Marguarite Arbour, MD [address] [facility] [place],  [place] [address]  Chief Complaint  Patient presents with  . Follow-up    HPI: Mr. 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    He states that he has been noting some white discharge around his stoma and into his bag.  He is also having a rash, bleeding and itching/burning around the site.  He states that this has been going on for one month.  He states he is getting adequate UOP and the urine itself has been clear.  Patient denies any gross hematuria, dysuria or suprapubic/flank pain.  Patient denies any fevers, chills, nausea or vomiting.     A Renal Stone study on 07/2019 Cystectomy and ileal conduit creation, without acute complication.  Marked left renal atrophy is similar. Nonobstructive left Nephrolithiasis.  Cystoscopy with Dr. Richardo Hanks 07/2019 NED.  Urine cytology negative.    PMH: Past Medical History:  Diagnosis Date  . Depression   . Diabetes mellitus without complication (HCC)   . Dyspnea   . Elevated lipids   . Femur fracture (HCC) 08/22/2018  . GERD (gastroesophageal reflux disease)   . Hypertension   . Pulmonary emboli (HCC)   . Renal insufficiency    only has 1 kidney  . Sleep apnea   . Stroke (HCC)    Rt side weakness    Surgical History: Past Surgical History:  Procedure Laterality Date  . 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 AORTA SURGERY    . CAROTID ARTERY - SUBCLAVIAN ARTERY BYPASS GRAFT Bilateral   . CATARACT EXTRACTION W/ INTRAOCULAR LENS  IMPLANT, BILATERAL    . CHOLECYSTECTOMY    . EYE SURGERY    . FEMUR FRACTURE SURGERY Right   . HIP FRACTURE SURGERY Bilateral   . I & D  EXTREMITY Right 10/18/2018   Procedure: IRRIGATION AND DEBRIDEMENT EXTREMITY, POSSIBLE HARDWARE REMOVAL OF SUPERCONDYLAR PLATE;  Surgeon: Christena Flake, MD;  Location: [facility];  Service: Orthopedics;  Laterality: Right;  . JOINT REPLACEMENT    . ORIF FEMUR FRACTURE Right 08/24/2018   Procedure: OPEN REDUCTION INTERNAL FIXATION (ORIF) SUPRACONDYLAR FEMUR FRACTURE;  Surgeon: Christena Flake, MD;  Location: [facility];  Service: Orthopedics;  Laterality: Right;  . ORIF FEMUR FRACTURE Right 10/18/2018   Procedure: HARDWARE REMOVAL- DISTAL FEMUR FRACTURE FIXATION;  Surgeon: Christena Flake, MD;  Location: [facility];  Service: Orthopedics;  Laterality: Right;  . PROSTATE SURGERY    . REPLACEMENT TOTAL KNEE Right   . TOTAL HIP ARTHROPLASTY Right 03/02/2017   Procedure: TOTAL HIP ARTHROPLASTY ANTERIOR APPROACH;  Surgeon: Kennedy Bucker, MD;  Location: [facility];  Service: Orthopedics;  Laterality: Right;  [facility] VASCULAR SURGERY      Home Medications:  Allergies as of 09/18/2019      Reactions   Codeine Anaphylaxis   Morphine Anaphylaxis   Gabapentin Other (See Comments)   Unsure of what the reaction was   Stadol [butorphanol]    Unknown   Lyrica [pregabalin] Rash   Tapentadol Nausea Only   ONSET 01/04/2013   Tramadol    Hallucinations "I see giant spiders on ceiling". Pt also believes he is in [facility], but he  is in [place].      Medication List       Accurate as of September 18, 2019 11:59 PM. If you have any questions, ask your nurse or doctor.        acetaminophen 325 MG tablet Commonly known as: TYLENOL Take 2 tablets (650 mg total) by mouth every 6 (six) hours as needed for mild pain (or Fever >/= 101).   albuterol (2.5 MG/3ML) 0.083% nebulizer solution Commonly known as: PROVENTIL Take 3 mLs (2.5 mg total) by nebulization every 4 (four) hours as needed for wheezing or shortness of breath.   allopurinol 100 MG tablet Commonly known as: ZYLOPRIM Take 0.5 tablets (50 mg total) by mouth every other  day.   atorvastatin 20 MG tablet Commonly known as: LIPITOR Take 1 tablet (20 mg total) by mouth at bedtime.   budesonide 0.5 MG/2ML nebulizer solution Commonly known as: PULMICORT Take 2 mLs (0.5 mg total) by nebulization 2 (two) times daily.   Cholecalciferol 100 MCG (4000 UT) Caps Take 1 capsule by mouth daily.   ciprofloxacin 250 MG tablet Commonly known as: Cipro Take 1 tablet (250 mg total) by mouth 2 (two) times daily.   DERMACLOUD EX Apply liberal amount topically as needed to area of skin irritation.  Ok to leave at bedside   fluconazole 100 MG tablet Commonly known as: DIFLUCAN Take 1 tablet (100 mg total) by mouth daily. X 7 days Started by: Michiel Cowboy, PA-C   furosemide 40 MG tablet Commonly known as: LASIX Take 1 tablet (40 mg total) by mouth at bedtime.   insulin aspart 100 UNIT/ML injection Commonly known as: novoLOG Inject 0-5 Units into the skin at bedtime. Correction coverage: HS scale CBG<70: Implement hypoglycemia protocol. CBG 70-120: 0 Units, CBG 121-150: 0 Units, CBG 151-200: 0 Units, CBG 201-250: 2 Units, CBG 251-300: 3 Units, CBG 301-350: 4 Units, CBG 351-400: 5 Units, CBG >400: Call MD and obtain STAT lab verification.   ipratropium-albuterol 0.5-2.5 (3) MG/3ML Soln Commonly known as: DUONEB Take 3 mLs by nebulization 2 (two) times daily.   loperamide 2 MG tablet Commonly known as: IMODIUM A-D Take 2 mg by mouth 4 (four) times daily as needed for diarrhea or loose stools.   metoprolol succinate 100 MG 24 hr tablet Commonly known as: TOPROL-XL Take 1 tablet (100 mg total) by mouth at bedtime.   mirtazapine 15 MG tablet Commonly known as: REMERON Take 15 mg by mouth at bedtime.   multivitamin-lutein Caps capsule Take 1 capsule by mouth daily.   NON FORMULARY Diet Type: NAS. NCS   NovoLOG Mix 70/30 FlexPen (70-30) 100 UNIT/ML FlexPen Generic drug: insulin aspart protamine - aspart Inject 0.25 mLs (25 Units total) into the skin 2  (two) times daily with a meal.   nystatin powder Commonly known as: MYCOSTATIN/NYSTOP Apply topically as directed. Apply with each ostomy change as instructed   omeprazole 40 MG capsule Commonly known as: PRILOSEC Take 1 capsule (40 mg total) by mouth daily.   sertraline 100 MG tablet Commonly known as: ZOLOFT Take 1 tablet (100 mg total) by mouth at bedtime.   tiotropium 18 MCG inhalation capsule Commonly known as: SPIRIVA Place 18 mcg into inhaler and inhale daily.       Allergies:  Allergies  Allergen Reactions  . Codeine Anaphylaxis  . Morphine Anaphylaxis  . Gabapentin Other (See Comments)    Unsure of what the reaction was   . Stadol [Butorphanol]     Unknown  . Lyrica [  Pregabalin] Rash  . Tapentadol Nausea Only    ONSET 01/04/2013  . Tramadol     Hallucinations "I see giant spiders on ceiling". Pt also believes he is in [facility], but he is in [place].    Family History: Family History  Problem Relation Age of Onset  . Heart attack Father   . Stroke Father   . Heart disease Father   . Heart attack Mother     Social History:  reports that he quit smoking about 67 years ago. His smoking use included cigarettes. He has a 7.00 pack-year smoking history. He has quit using smokeless tobacco. He reports that he does not drink alcohol or use drugs.  ROS: UROLOGY Frequent Urination?: No Hard to postpone urination?: No Burning/pain with urination?: No Get up at night to urinate?: No Leakage of urine?: No Urine stream starts and stops?: No Trouble starting stream?: No Do you have to strain to urinate?: No Blood in urine?: No Urinary tract infection?: No Sexually transmitted disease?: No Injury to kidneys or bladder?: No Painful intercourse?: No Weak stream?: No Erection problems?: No Penile pain?: No  Gastrointestinal Nausea?: No Vomiting?: No Indigestion/heartburn?: No Diarrhea?: No Constipation?: No  Constitutional Fever: No Night sweats?: No  Weight loss?: No Fatigue?: No  Skin Skin rash/lesions?: No Itching?: No  Eyes Blurred vision?: No Double vision?: No  Ears/Nose/Throat Sore throat?: No Sinus problems?: No  Hematologic/Lymphatic Swollen glands?: No Easy bruising?: No  Cardiovascular Leg swelling?: No Chest pain?: No  Respiratory Cough?: No Shortness of breath?: No  Endocrine Excessive thirst?: No  Musculoskeletal Back pain?: Yes Joint pain?: Yes  Neurological Headaches?: No Dizziness?: No  Psychologic Depression?: No Anxiety?: No  Physical Exam: BP (!) 150/82   Ht 5\' 9"  (1.753 m)   Wt 230 lb (104.3 kg)   BMI 33.97 kg/m   Constitutional:  Well nourished. Alert and oriented, No acute distress. HEENT: [place] AT, moist mucus membranes.  Trachea midline, no masses. Cardiovascular: No clubbing, cyanosis, or edema. Respiratory: Normal respiratory effort, no increased work of breathing. GI: Abdomen is soft, non tender, non distended, no abdominal masses. Liver and spleen not palpable.  No hernias appreciated.  Stool sample for occult testing is not indicated.   GU: No CVA tenderness.  No bladder fullness or masses.   Skin: No rashes, bruises or suspicious lesions.  Erythematous, scaling plaques in a circular pattern in the area of the ostomy adhesive are seen.  Stoma appears pink and healthy appearing - draining clear yellow urine.  Lymph: No cervical or inguinal adenopathy. Neurologic: Grossly intact, no focal deficits, moving all 4 extremities. Psychiatric: Normal mood and affect.  Laboratory Data: Lab Results  Component Value Date   WBC 8.1 02/10/2019   HGB 9.0 (L) 02/10/2019   HCT 30.6 (L) 02/10/2019   MCV 82.7 02/10/2019   PLT 164 02/10/2019    Lab Results  Component Value Date   CREATININE 1.23 02/10/2019    No results found for: PSA  No results found for: TESTOSTERONE  Lab Results  Component Value Date   HGBA1C 5.2 02/07/2018    No results found for: TSH  No results  found for: CHOL, HDL, CHOLHDL, VLDL, LDLCALC  Lab Results  Component Value Date   AST 25 10/06/2018   Lab Results  Component Value Date   ALT 14 10/06/2018   No components found for: ALKALINEPHOPHATASE No components found for: BILIRUBINTOTAL  No results found for: ESTRADIOL  Urinalysis Component     Latest  Ref Rng & Units 09/18/2019  Color, Urine     YELLOW YELLOW  Appearance     CLEAR CLEAR  Specific Gravity, Urine     1.005 - 1.030 1.020  pH     5.0 - 8.0 6.5  Glucose, UA     NEGATIVE mg/dL NEGATIVE  Hgb urine dipstick     NEGATIVE SMALL (A)  Bilirubin Urine     NEGATIVE NEGATIVE  Ketones, ur     NEGATIVE mg/dL NEGATIVE  Protein     NEGATIVE mg/dL 30 (A)  Nitrite     NEGATIVE NEGATIVE  Leukocytes,Ua     NEGATIVE SMALL (A)  RBC / HPF     0 - 5 RBC/hpf 6-10  WBC, UA     0 - 5 WBC/hpf 6-10  Bacteria, UA     NONE SEEN NONE SEEN  Squamous Epithelial / LPF     0 - 5 NONE SEEN    I have reviewed the labs.     Assessment & Plan:    1. Stoma issues Stoma itself appears healthy ? Of yeast infection under the urostomy adhesive - reached out to [employer] [facility] regarding on how to treat as antifungal creams or powders would not allow the bad to adhere to the skin  Placed a new urostomy bag onto patient  Reached out to Dr. Kathrynn RunningManning and he suggests applying OTC Desitin after a shower for 30 minutes and then wiping clean and applying the appliance twice daily and Diflucan 100 mg daily x 10 days.   Return in about 3 weeks (around 10/09/2019) for recheck stoma .  These notes generated with voice recognition software. I apologize for typographical errors.  Michiel CowboySHANNON Bartolo Montanye, PA-C  [facility] [address]  [address] [place], [place] [id number] [phone]

## 2019-09-21 ENCOUNTER — Telehealth: Payer: Self-pay

## 2019-09-21 ENCOUNTER — Telehealth: Payer: Self-pay | Admitting: Family Medicine

## 2019-09-21 LAB — URINE CULTURE: Culture: 80000 — AB

## 2019-09-21 MED ORDER — SULFAMETHOXAZOLE-TRIMETHOPRIM 800-160 MG PO TABS: 1.0000 | ORAL_TABLET | Freq: Two times a day (BID) | ORAL | 0 refills | Status: DC

## 2019-09-21 NOTE — Telephone Encounter (Signed)
-----   Message from Nori Riis, PA-C sent at 09/21/2019  8:20 AM EST ----- Please let Gary Thomas know that his urine culture was positive and he needs to start Septra DS, BID x 7 days.

## 2019-09-21 NOTE — Telephone Encounter (Signed)
Patient notified and ABX was sent to pharmacy. 

## 2019-09-21 NOTE — Telephone Encounter (Signed)
Called and spoke with patient's wife and notified her that we were able to get samples from [facility] along with patient education on "crusting" to help with site irritation. Samples of Brava powder, skin barrier wipes, protective sheets, and two different types of Mio Convex pouches will be sent. Patient's wife states she will try the products and if they work well will contact us to order more

## 2019-10-03 ENCOUNTER — Telehealth: Payer: Self-pay | Admitting: Urology

## 2019-10-03 NOTE — Telephone Encounter (Signed)
Would you call the [facility] and schedule them for an appointment for a recheck on his stoma next week?

## 2019-10-09 NOTE — Telephone Encounter (Signed)
Appt made for 10/17/2019, pt is aware.

## 2019-10-16 NOTE — Progress Notes (Signed)
10/17/2019 11:18 AM   Gary Thomas 1932/03/16 [id number]  Referring provider: Idelle Crouch, MD [place] [facility] [place],  [place] [address]  No chief complaint on file.   HPI: Gary Thomas is an 86 year old male with an ileal conduit urinary diversion for urinary incontinence following prostate cancer surgery several years ago who presents today for issues with his stoma with his wife, [name].    A Renal Stone study on 07/2019 Cystectomy and ileal conduit creation, without acute complication.  Marked left renal atrophy is similar. Nonobstructive left Nephrolithiasis.  Cystoscopy with Dr. Diamantina Providence 07/2019 NED.  Urine cytology negative.    At his visit on 09/18/2019, he was having some white discharge around his stoma and into the bag associated with a rash, bleeding and itching/burning around the stoma site.  They were instructed to apply OTC Desitin after shower for 30 minutes daily and then wiping the area clean and applying the appliance twice daily and they were also placed on a 10-day regimen of Diflucan 100 mg.  We also contacted [facility] and they were provided with new appliances and Fonnie Jarvis, CMA reviewed with him new techniques to help avoid fungal infections in the future.    With the new supplies were mailed to their house, they thought it was a Christmas present from their son and had not yet opened the box until a few days ago.     He states he still is having the redness, bleeding and itching and burning around the skin where the appliance is adhered to the stoma site.  PMH: Past Medical History:  Diagnosis Date  . Depression   . Diabetes mellitus without complication ([place])   . Dyspnea   . Elevated lipids   . Femur fracture ([place]) 08/22/2018  . GERD (gastroesophageal reflux disease)   . Hypertension   . Pulmonary emboli ([place])   . Renal insufficiency    only has 1 kidney  . Sleep apnea   . Stroke ([place])    Rt side weakness     Surgical History: Past Surgical History:  Procedure Laterality Date  . 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 AORTA SURGERY    . CAROTID ARTERY - SUBCLAVIAN ARTERY BYPASS GRAFT Bilateral   . CATARACT EXTRACTION W/ INTRAOCULAR LENS  IMPLANT, BILATERAL    . CHOLECYSTECTOMY    . EYE SURGERY    . FEMUR FRACTURE SURGERY Right   . HIP FRACTURE SURGERY Bilateral   . I & D EXTREMITY Right 10/18/2018   Procedure: IRRIGATION AND DEBRIDEMENT EXTREMITY, POSSIBLE HARDWARE REMOVAL OF SUPERCONDYLAR PLATE;  Surgeon: Corky Mull, MD;  Location: [facility];  Service: Orthopedics;  Laterality: Right;  . JOINT REPLACEMENT    . ORIF FEMUR FRACTURE Right 08/24/2018   Procedure: OPEN REDUCTION INTERNAL FIXATION (ORIF) SUPRACONDYLAR FEMUR FRACTURE;  Surgeon: Corky Mull, MD;  Location: [facility];  Service: Orthopedics;  Laterality: Right;  . ORIF FEMUR FRACTURE Right 10/18/2018   Procedure: HARDWARE REMOVAL- DISTAL FEMUR FRACTURE FIXATION;  Surgeon: Corky Mull, MD;  Location: [facility];  Service: Orthopedics;  Laterality: Right;  . PROSTATE SURGERY    . REPLACEMENT TOTAL KNEE Right   . TOTAL HIP ARTHROPLASTY Right 03/02/2017   Procedure: TOTAL HIP ARTHROPLASTY ANTERIOR APPROACH;  Surgeon: [name], MD;  Location: [facility];  Service: Orthopedics;  Laterality: Right;  [facility] VASCULAR SURGERY      Home Medications:  Allergies  as of 10/17/2019      Reactions   Codeine Anaphylaxis   Morphine Anaphylaxis   Gabapentin Other (See Comments)   Unsure of what the reaction was   Stadol [butorphanol]    Unknown   Lyrica [pregabalin] Rash   Tapentadol Nausea Only   ONSET 01/04/2013   Tramadol    Hallucinations "I see giant spiders on ceiling". Pt also believes he is in [facility], but he is in [place].      Medication List       Accurate as of October 17, 2019 11:18 AM. If you have any questions, ask your nurse or doctor.        acetaminophen 325 MG tablet Commonly known  as: TYLENOL Take 2 tablets (650 mg total) by mouth every 6 (six) hours as needed for mild pain (or Fever >/= 101).   albuterol (2.5 MG/3ML) 0.083% nebulizer solution Commonly known as: PROVENTIL Take 3 mLs (2.5 mg total) by nebulization every 4 (four) hours as needed for wheezing or shortness of breath.   allopurinol 100 MG tablet Commonly known as: ZYLOPRIM Take 0.5 tablets (50 mg total) by mouth every other day.   atorvastatin 20 MG tablet Commonly known as: LIPITOR Take 1 tablet (20 mg total) by mouth at bedtime.   budesonide 0.5 MG/2ML nebulizer solution Commonly known as: PULMICORT Take 2 mLs (0.5 mg total) by nebulization 2 (two) times daily.   Cholecalciferol 100 MCG (4000 UT) Caps Take 1 capsule by mouth daily.   ciprofloxacin 250 MG tablet Commonly known as: Cipro Take 1 tablet (250 mg total) by mouth 2 (two) times daily.   DERMACLOUD EX Apply liberal amount topically as needed to area of skin irritation.  Ok to leave at bedside   fluconazole 100 MG tablet Commonly known as: DIFLUCAN Take 1 tablet (100 mg total) by mouth daily. X 7 days   furosemide 40 MG tablet Commonly known as: LASIX Take 1 tablet (40 mg total) by mouth at bedtime.   insulin aspart 100 UNIT/ML injection Commonly known as: novoLOG Inject 0-5 Units into the skin at bedtime. Correction coverage: HS scale CBG<70: Implement hypoglycemia protocol. CBG 70-120: 0 Units, CBG 121-150: 0 Units, CBG 151-200: 0 Units, CBG 201-250: 2 Units, CBG 251-300: 3 Units, CBG 301-350: 4 Units, CBG 351-400: 5 Units, CBG >400: Call MD and obtain STAT lab verification.   ipratropium-albuterol 0.5-2.5 (3) MG/3ML Soln Commonly known as: DUONEB Take 3 mLs by nebulization 2 (two) times daily.   loperamide 2 MG tablet Commonly known as: IMODIUM A-D Take 2 mg by mouth 4 (four) times daily as needed for diarrhea or loose stools.   metoprolol succinate 100 MG 24 hr tablet Commonly known as: TOPROL-XL Take 1 tablet (100 mg  total) by mouth at bedtime.   mirtazapine 15 MG tablet Commonly known as: REMERON Take 15 mg by mouth at bedtime.   multivitamin-lutein Caps capsule Take 1 capsule by mouth daily.   NON FORMULARY Diet Type: NAS. NCS   NovoLOG Mix 70/30 FlexPen (70-30) 100 UNIT/ML FlexPen Generic drug: insulin aspart protamine - aspart Inject 0.25 mLs (25 Units total) into the skin 2 (two) times daily with a meal.   nystatin powder Commonly known as: MYCOSTATIN/NYSTOP Apply topically as directed. Apply with each ostomy change as instructed   omeprazole 40 MG capsule Commonly known as: PRILOSEC Take 1 capsule (40 mg total) by mouth daily.   sertraline 100 MG tablet Commonly known as: ZOLOFT Take 1 tablet (100 mg total)  by mouth at bedtime.   sulfamethoxazole-trimethoprim 800-160 MG tablet Commonly known as: BACTRIM DS Take 1 tablet by mouth every 12 (twelve) hours.   tiotropium 18 MCG inhalation capsule Commonly known as: SPIRIVA Place 18 mcg into inhaler and inhale daily.       Allergies:  Allergies  Allergen Reactions  . Codeine Anaphylaxis  . Morphine Anaphylaxis  . Gabapentin Other (See Comments)    Unsure of what the reaction was   . Stadol [Butorphanol]     Unknown  . Lyrica [Pregabalin] Rash  . Tapentadol Nausea Only    ONSET 01/04/2013  . Tramadol     Hallucinations "I see giant spiders on ceiling". Pt also believes he is in [facility], but he is in [place].    Family History: Family History  Problem Relation Age of Onset  . Heart attack Father   . Stroke Father   . Heart disease Father   . Heart attack Mother     Social History:  reports that he quit smoking about 67 years ago. His smoking use included cigarettes. He has a 7.00 pack-year smoking history. He has quit using smokeless tobacco. He reports that he does not drink alcohol or use drugs.  ROS: UROLOGY Frequent Urination?: No Hard to postpone urination?: No Burning/pain with urination?: No Get up at night  to urinate?: No Leakage of urine?: No Urine stream starts and stops?: No Trouble starting stream?: No Do you have to strain to urinate?: No Blood in urine?: No Urinary tract infection?: No Sexually transmitted disease?: No Injury to kidneys or bladder?: No Painful intercourse?: No Weak stream?: No Erection problems?: No Penile pain?: No  Gastrointestinal Nausea?: No Vomiting?: No Indigestion/heartburn?: No Diarrhea?: No Constipation?: No  Constitutional Fever: No Night sweats?: No Weight loss?: No Fatigue?: No  Skin Skin rash/lesions?: No Itching?: No  Eyes Blurred vision?: No Double vision?: No  Ears/Nose/Throat Sore throat?: No Sinus problems?: No  Hematologic/Lymphatic Swollen glands?: No Easy bruising?: No  Cardiovascular Leg swelling?: No Chest pain?: No  Respiratory Cough?: No Shortness of breath?: No  Endocrine Excessive thirst?: No  Musculoskeletal Back pain?: Yes Joint pain?: Yes  Neurological Headaches?: No Dizziness?: No  Psychologic Depression?: No Anxiety?: No  Physical Exam: BP 116/75   Pulse 73   Constitutional:  Well nourished. Alert and oriented, No acute distress. HEENT: [place] AT, make in place.  Trachea midline, no masses. Cardiovascular: No clubbing, cyanosis, or edema. Respiratory: Normal respiratory effort, no increased work of breathing. GI: Abdomen is soft, non tender, non distended, no abdominal masses. Liver and spleen not palpable.  No hernias appreciated.  Stool sample for occult testing is not indicated.   GU: No CVA tenderness.  No bladder fullness or masses.   Skin: Stoma appliance in place.  We could not remove the appliance as they did not have a replacement.  Stoma appeared pink and healthy.  A rash is seen at the borders of the appliance.   Lymph: No inguinal adenopathy. Neurologic: Grossly intact, no focal deficits, moving all 4 extremities. Psychiatric: Normal mood and affect.  Laboratory Data: Lab  Results  Component Value Date   WBC 8.1 02/10/2019   HGB 9.0 (L) 02/10/2019   HCT 30.6 (L) 02/10/2019   MCV 82.7 02/10/2019   PLT 164 02/10/2019    Lab Results  Component Value Date   CREATININE 1.23 02/10/2019    No results found for: PSA  No results found for: TESTOSTERONE  Lab Results  Component Value Date  HGBA1C 5.2 02/07/2018    No results found for: TSH  No results found for: CHOL, HDL, CHOLHDL, VLDL, LDLCALC  Lab Results  Component Value Date   AST 25 10/06/2018   Lab Results  Component Value Date   ALT 14 10/06/2018   No components found for: ALKALINEPHOPHATASE No components found for: BILIRUBINTOTAL  No results found for: ESTRADIOL  Urinalysis Component     Latest Ref Rng & Units 09/18/2019  Color, Urine     YELLOW YELLOW  Appearance     CLEAR CLEAR  Specific Gravity, Urine     1.005 - 1.030 1.020  pH     5.0 - 8.0 6.5  Glucose, UA     NEGATIVE mg/dL NEGATIVE  Hgb urine dipstick     NEGATIVE SMALL (A)  Bilirubin Urine     NEGATIVE NEGATIVE  Ketones, ur     NEGATIVE mg/dL NEGATIVE  Protein     NEGATIVE mg/dL 30 (A)  Nitrite     NEGATIVE NEGATIVE  Leukocytes,Ua     NEGATIVE SMALL (A)  RBC / HPF     0 - 5 RBC/hpf 6-10  WBC, UA     0 - 5 WBC/hpf 6-10  Bacteria, UA     NONE SEEN NONE SEEN  Squamous Epithelial / LPF     0 - 5 NONE SEEN    I have reviewed the labs.   Assessment & Plan:    1. Stoma issues Stoma itself appears healthy ? Of yeast infection under the urostomy adhesive - patient and wife will use the new supplies this week and return next week with an appliance bag so that we may change the bag to get a better look at the area  Return in about 1 week (around 10/24/2019) for wants a morning but not too early .  These notes generated with voice recognition software. I apologize for typographical errors.  Michiel Cowboy, PA-C  [facility] [address]  [address] [place], [place]  [address] [phone]

## 2019-10-17 ENCOUNTER — Encounter: Payer: Self-pay | Admitting: Urology

## 2019-10-17 ENCOUNTER — Other Ambulatory Visit: Payer: Self-pay

## 2019-10-17 ENCOUNTER — Ambulatory Visit (INDEPENDENT_AMBULATORY_CARE_PROVIDER_SITE_OTHER): Payer: Medicare Other | Admitting: Urology

## 2019-10-17 VITALS — BP 116/75 | HR 73

## 2019-10-17 DIAGNOSIS — L309 Dermatitis, unspecified: Secondary | ICD-10-CM | POA: Diagnosis not present

## 2019-10-19 IMAGING — DX DG CHEST 1V PORT
1 series · 1 of 1 positions shown · non-contrast
Comparison: Prior chest x-ray 08/22/2018

CLINICAL DATA: 86-year-old male with dyspnea postop

EXAM:
PORTABLE CHEST 1 VIEW

[chest ap]
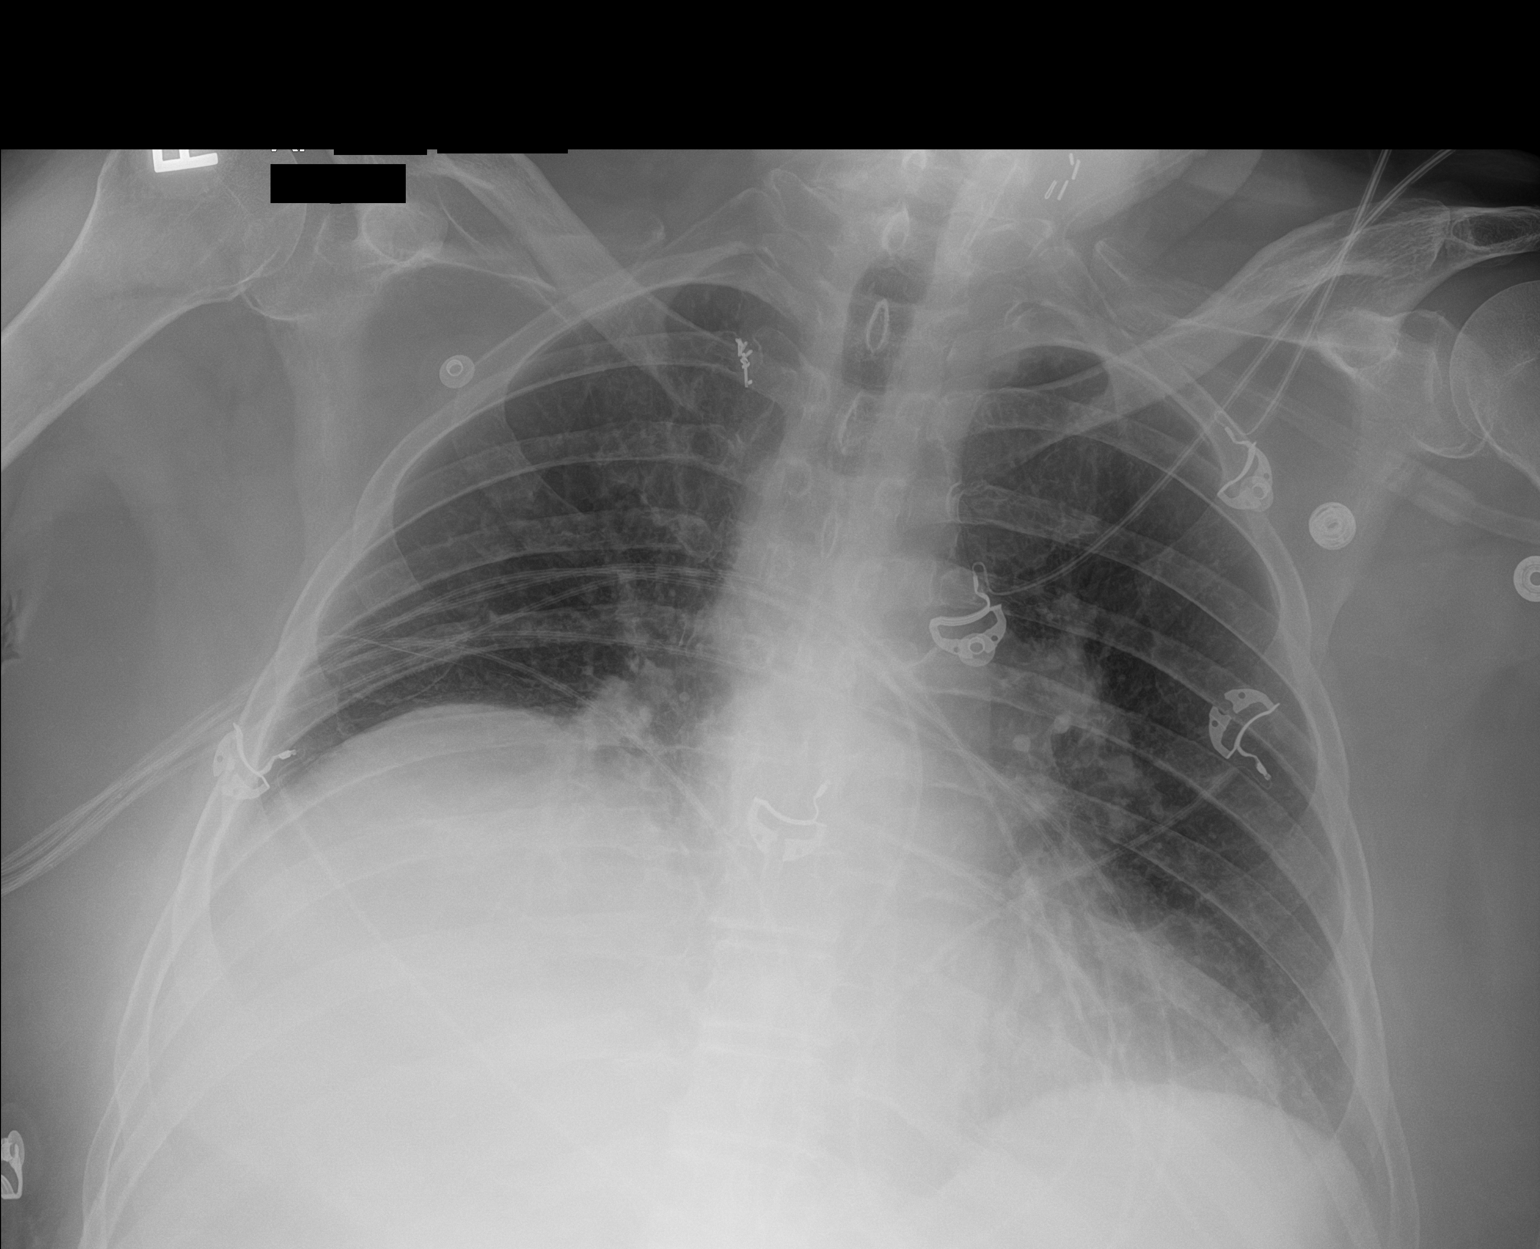

[1 of 1 positions shown; findings below may reference images not displayed]

FINDINGS: Low inspiratory volumes with bibasilar atelectasis. No evidence of
pneumothorax, large effusion or pleural effusion. Surgical clips
present in the right supraclavicular region and along the left neck.
No acute osseous abnormality.
IMPRESSION: Low inspiratory volumes with bibasilar atelectasis.

Otherwise, no acute cardiopulmonary process.

## 2019-10-19 IMAGING — XA DG C-ARM 61-120 MIN
1 series · 4 of 4 positions shown · non-contrast
Comparison: Preoperative radiographs 08/23/2018

CLINICAL DATA: 86-year-old male status post ORIF of right distal
femoral fracture in

EXAM:
OPERATIVE right femur
TECHNIQUE: Fluoroscopic spot image(s) were submitted for interpretation
post-operatively.

[Series 6001: p · 4 of 4 slices shown]
[im 1/4]
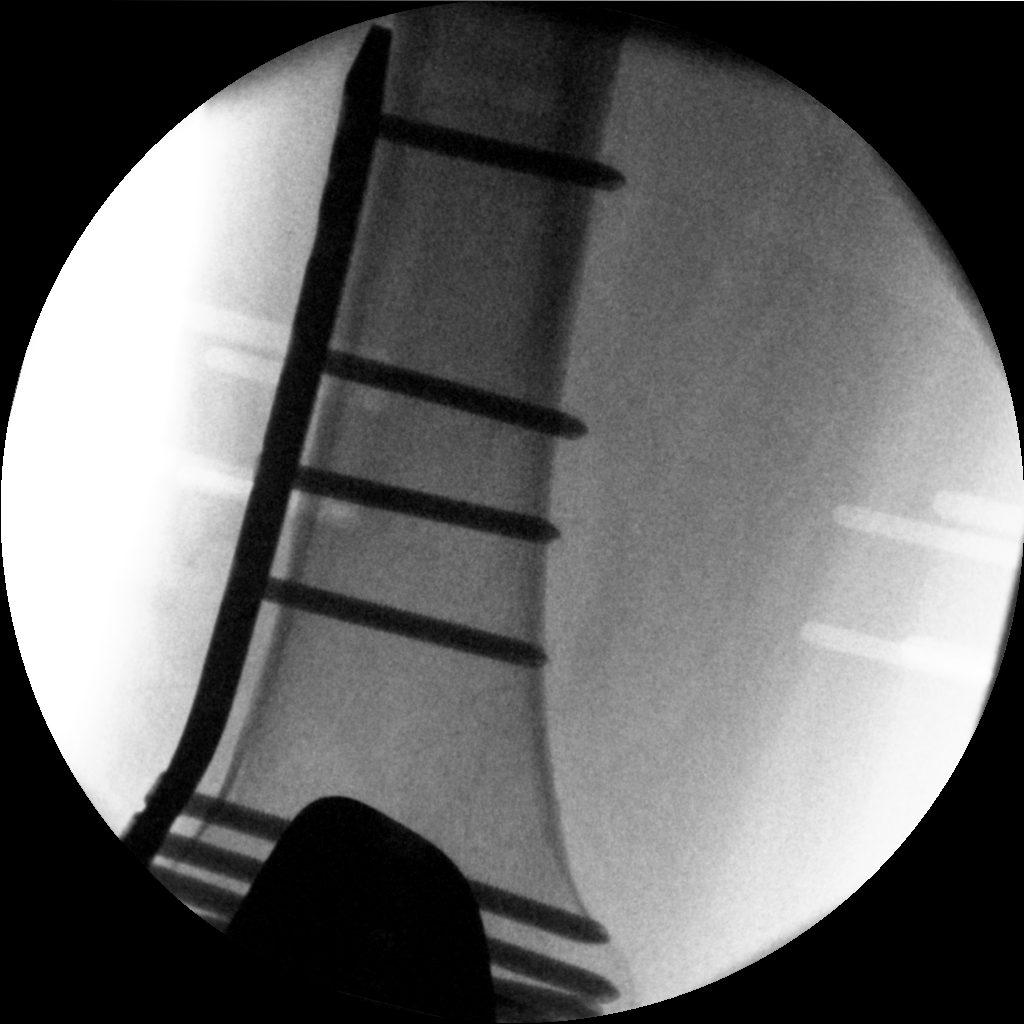
[im 2/4]
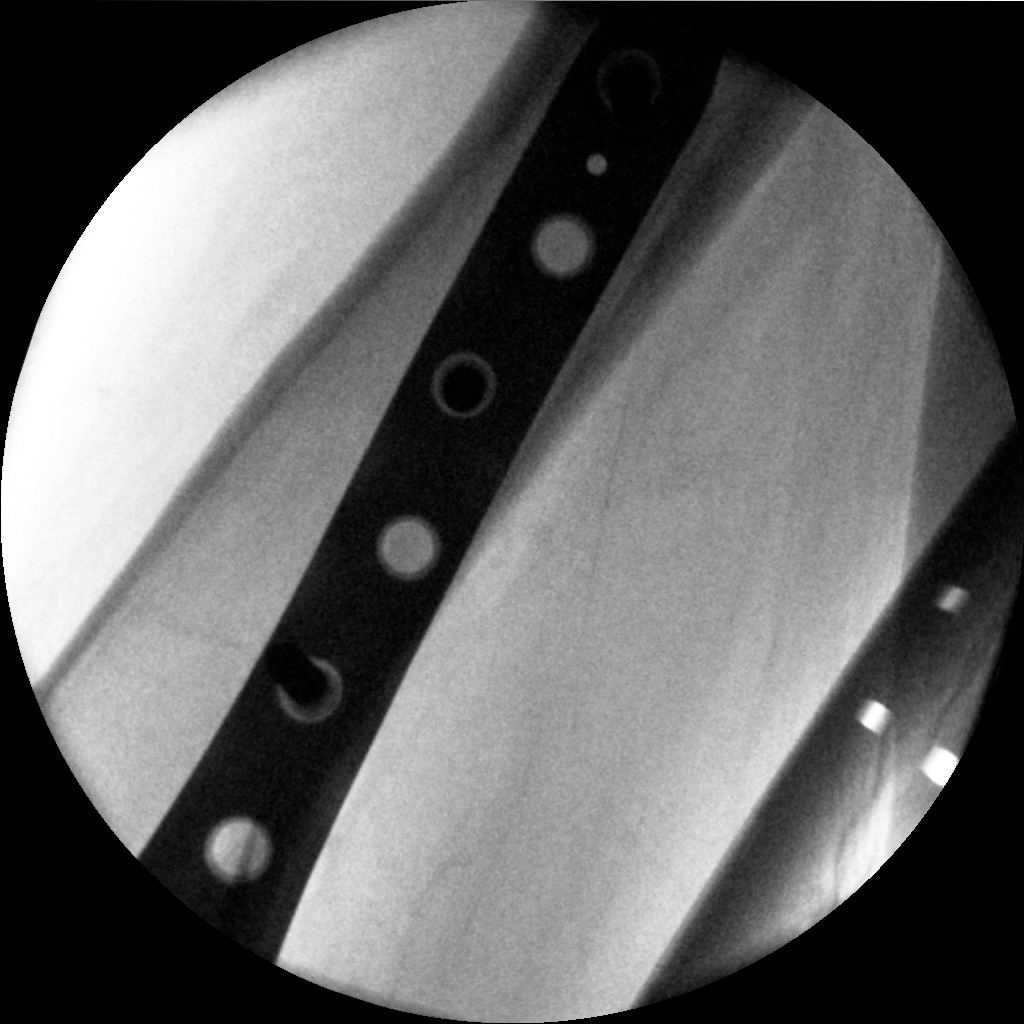
[im 3/4]
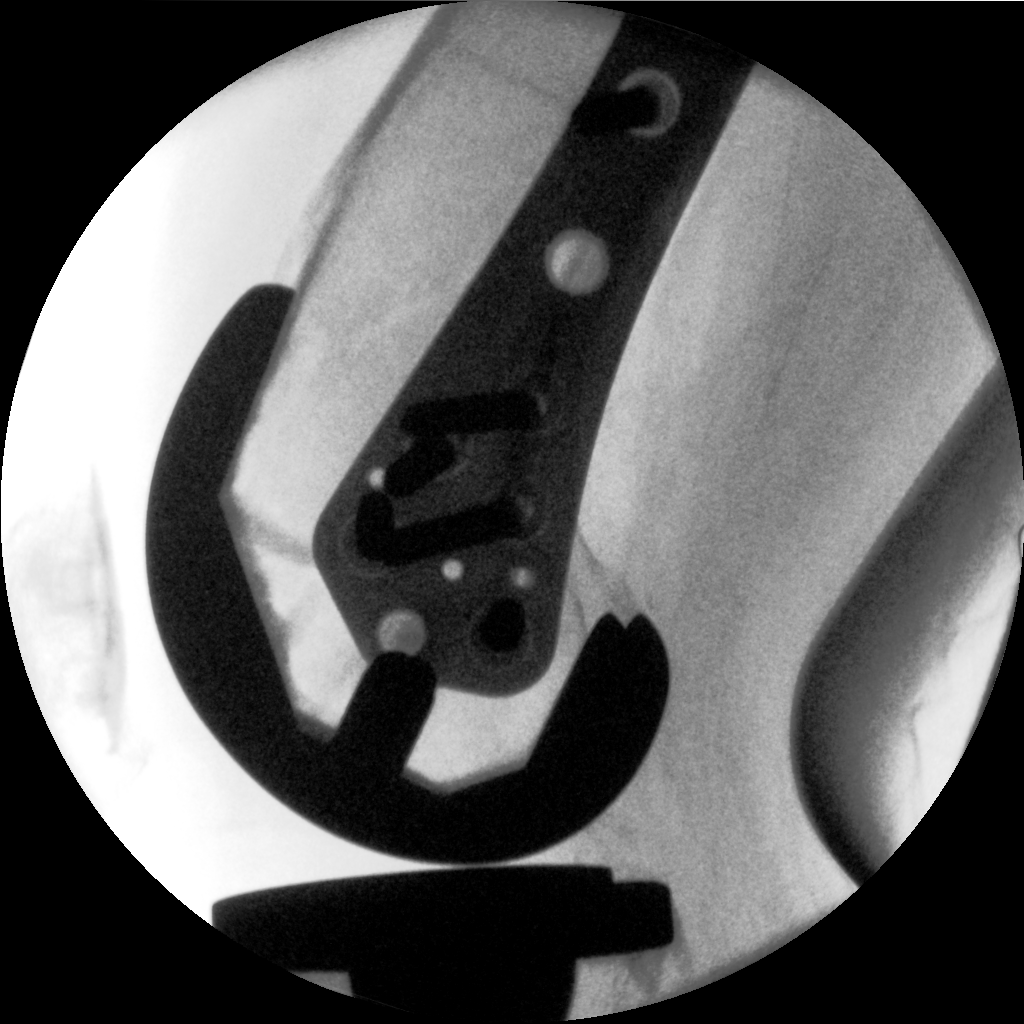
[im 4/4]
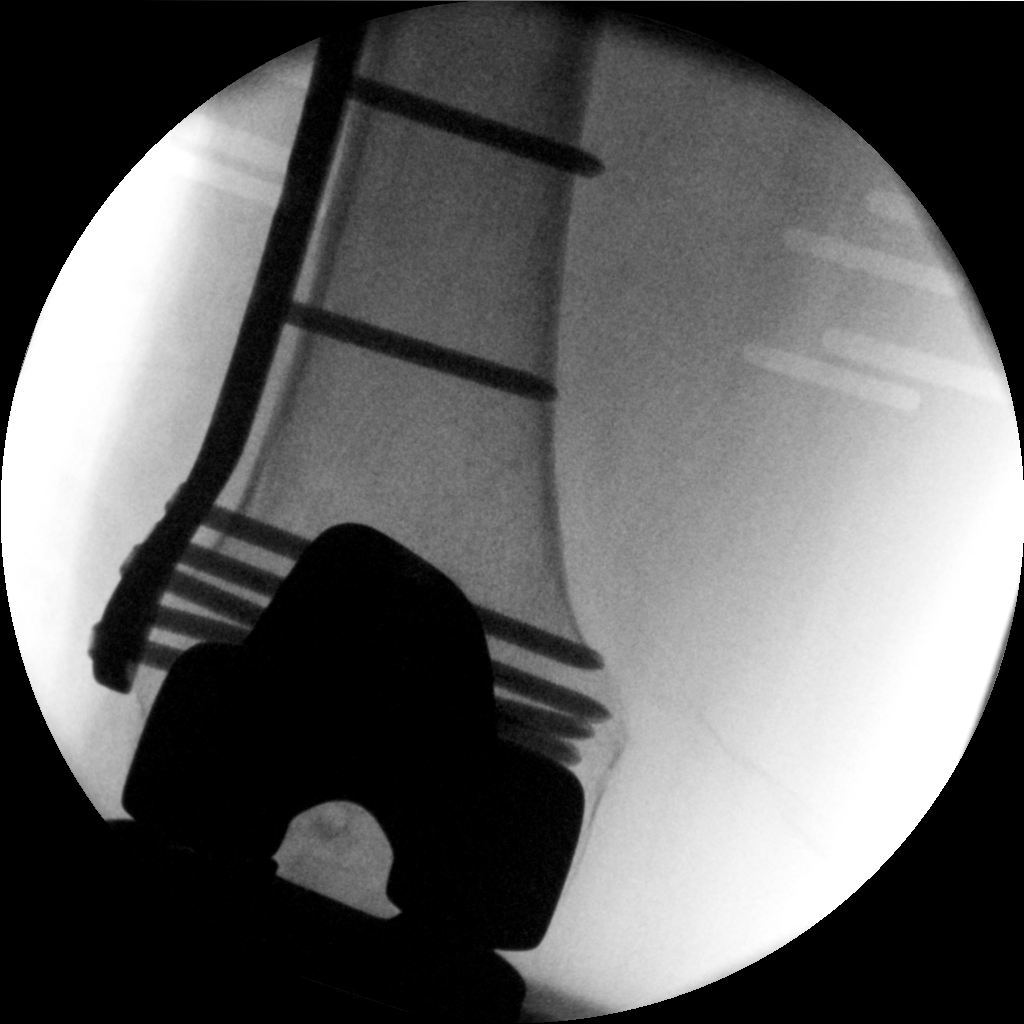

[4 of 4 positions shown; findings below may reference images not displayed]

FINDINGS: Intraoperative saved images demonstrate interval open reduction and
internal fixation of the periprosthetic distal femoral fracture with
a lateral buttress plate and screw construct. No evidence of
immediate hardware complication. Additionally, patient has surgical
changes consistent with prior total knee arthroplasty.
IMPRESSION: ORIF of distal right femoral fracture with lateral buttress plate
and screw construct. No immediate hardware complication.

## 2019-10-23 IMAGING — DX DG FOOT COMPLETE 3+V*L*
3 series · 3 of 3 positions shown · non-contrast
Comparison: Left ankle dated 08/22/2018.

CLINICAL DATA: Left foot pain since a fall 1 week ago.

EXAM:
LEFT FOOT - COMPLETE 3+ VIEW

[foot ap]
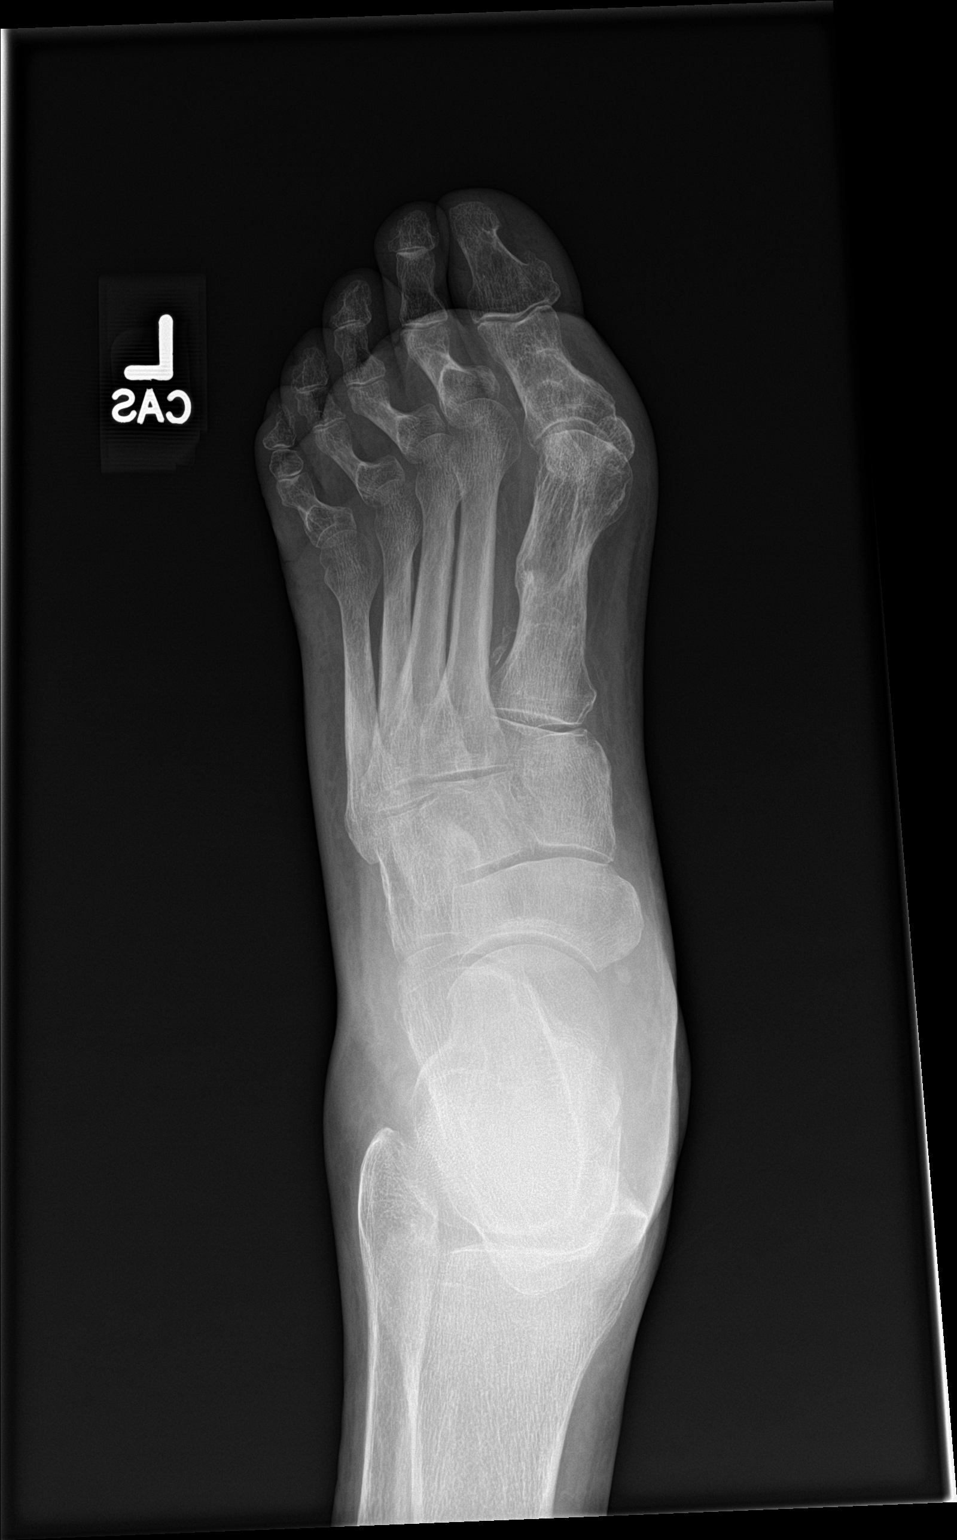

[foot obl]
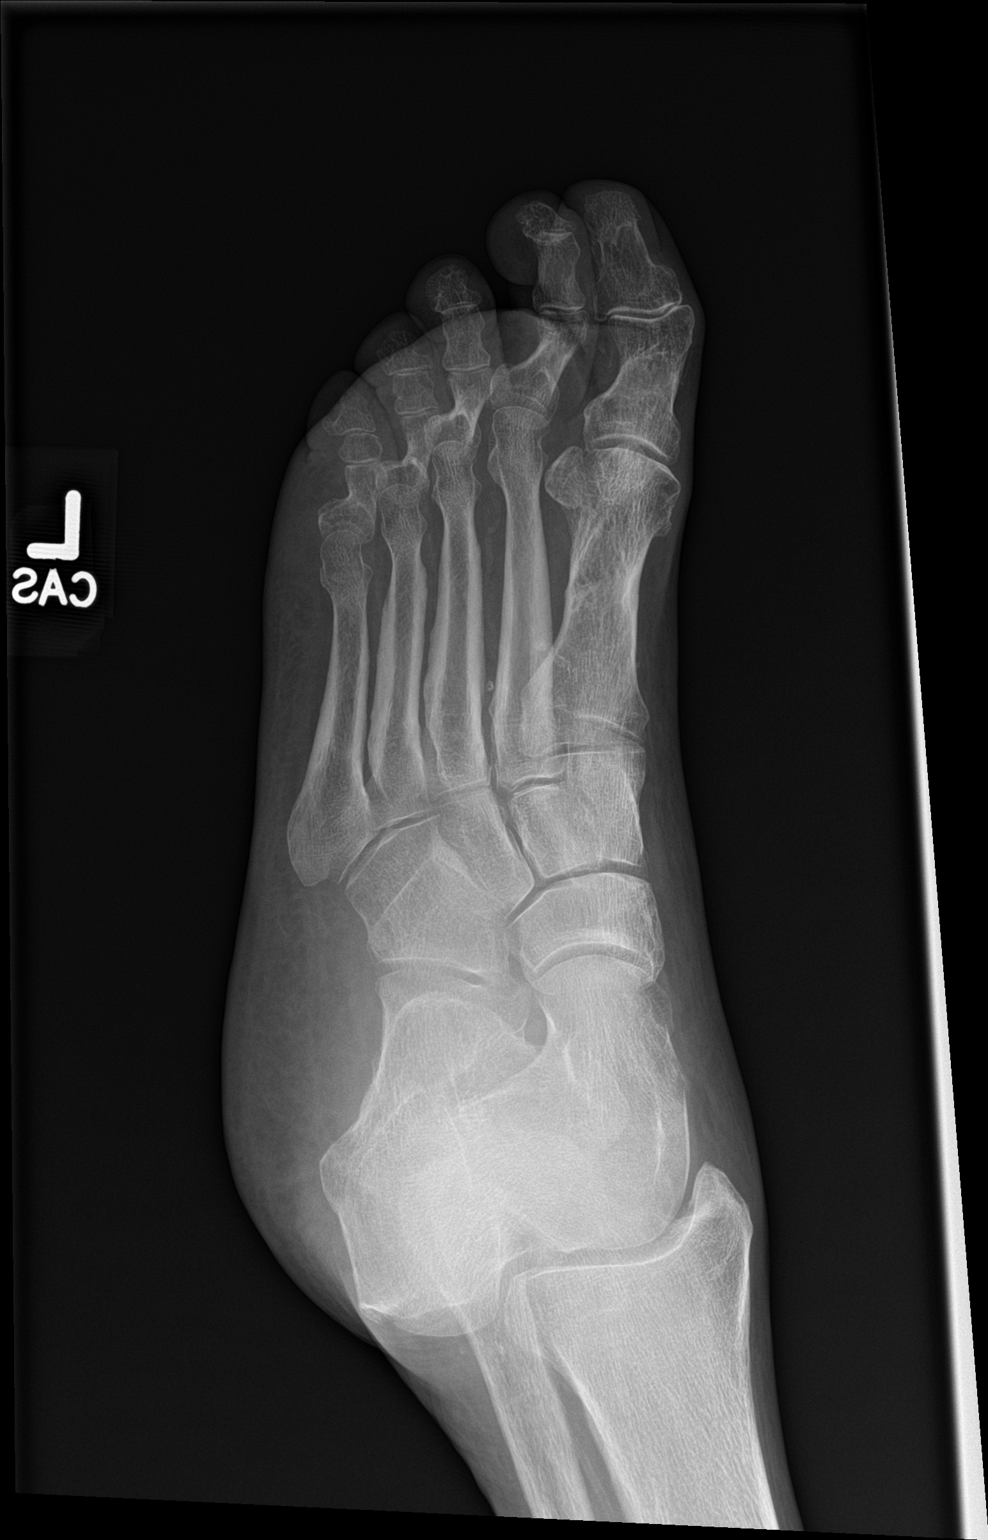

[foot lat]
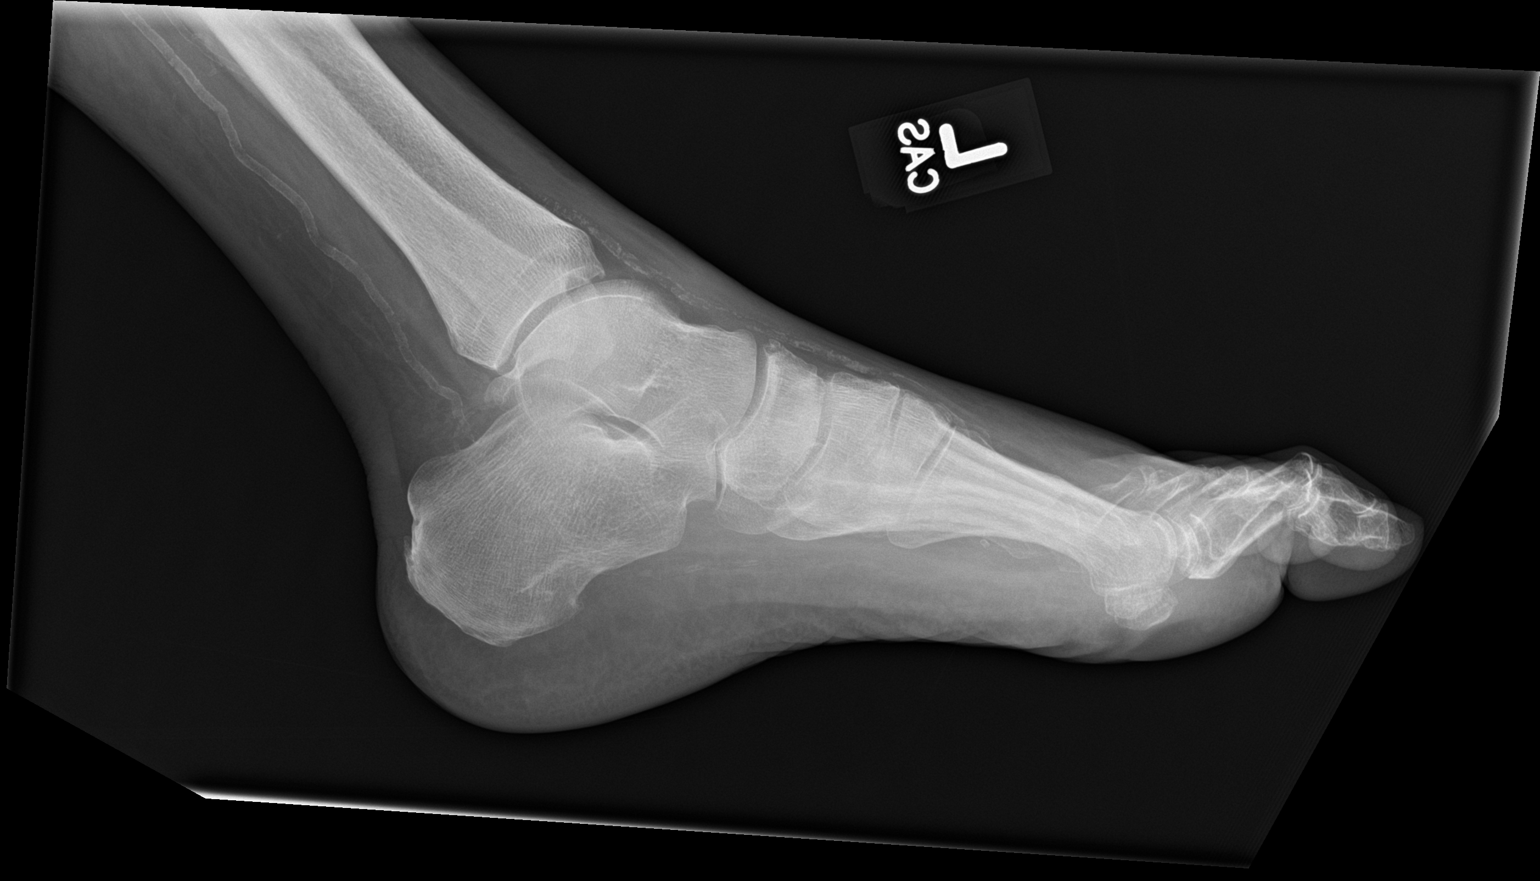

[3 of 3 positions shown; findings below may reference images not displayed]

FINDINGS: Old, healed 1st metatarsal fracture. Mild calcaneal spur formation.
No acute fracture or dislocation seen. Mild dorsal tarsal spur
formation. Diffuse arterial calcifications.
IMPRESSION: No acute fracture or dislocation.

## 2019-10-24 NOTE — Progress Notes (Signed)
10/25/2019 10:54 AM   Gary Thomas [birth year] [id number]  Referring provider: Marguarite Arbour, MD [address] [facility] [place],  [place] [address]  Chief Complaint  Patient presents with  . 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    A Renal Stone study on 07/2019 Cystectomy and ileal conduit creation, without acute complication.  Marked left renal atrophy is similar. Nonobstructive left Nephrolithiasis.  Cystoscopy with Gary Thomas 07/2019 NED.  Urine cytology negative.    At his visit on 09/18/2019, he was having some white discharge around his stoma and into the bag associated with a rash, bleeding and itching/burning around the stoma site.  They were instructed to apply OTC Desitin after shower for 30 minutes daily and then wiping the area clean and applying the appliance twice daily and they were also placed on a 10-day regimen of Diflucan 100 mg.  We also contacted [employer] and they were provided with new appliances and Gary Thomas, CMA reviewed with him new techniques to help avoid fungal infections in the future.    With the new supplies were mailed to their house, they thought it was a Christmas present from their son and had not yet opened the box until a few days ago.    They have been able to employ the "crusting" technique and apply the Desitin for 30 minutes after showering.  Patient denies any gross hematuria or suprapubic/flank pain.  Patient denies any fevers, chills, nausea or vomiting.   PMH: Past Medical History:  Diagnosis Date  . Depression   . Diabetes mellitus without complication (HCC)   . Dyspnea   . Elevated lipids   . Femur fracture (HCC) 08/22/2018  . GERD (gastroesophageal reflux disease)   . Hypertension   . Pulmonary emboli  (HCC)   . Renal insufficiency    only has 1 kidney  . Sleep apnea   . Stroke (HCC)    Rt side weakness    Surgical History: Past Surgical History:  Procedure Laterality Date  . 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 AORTA SURGERY    . CAROTID ARTERY - SUBCLAVIAN ARTERY BYPASS GRAFT Bilateral   . CATARACT EXTRACTION W/ INTRAOCULAR LENS  IMPLANT, BILATERAL    . CHOLECYSTECTOMY    . EYE SURGERY    . FEMUR FRACTURE SURGERY Right   . HIP FRACTURE SURGERY Bilateral   . I & D EXTREMITY Right 10/18/2018   Procedure: IRRIGATION AND DEBRIDEMENT EXTREMITY, POSSIBLE HARDWARE REMOVAL OF SUPERCONDYLAR PLATE;  Surgeon: Gary Flake, MD;  Location: [facility];  Service: Orthopedics;  Laterality: Right;  . JOINT REPLACEMENT    . ORIF FEMUR FRACTURE Right 08/24/2018   Procedure: OPEN REDUCTION INTERNAL FIXATION (ORIF) SUPRACONDYLAR FEMUR FRACTURE;  Surgeon: Gary Flake, MD;  Location: [facility];  Service: Orthopedics;  Laterality: Right;  . ORIF FEMUR FRACTURE Right 10/18/2018   Procedure: HARDWARE REMOVAL- DISTAL FEMUR FRACTURE FIXATION;  Surgeon: Gary Flake, MD;  Location: [facility];  Service: Orthopedics;  Laterality: Right;  . PROSTATE SURGERY    . REPLACEMENT TOTAL KNEE Right   . TOTAL HIP ARTHROPLASTY Right 03/02/2017   Procedure: TOTAL HIP ARTHROPLASTY ANTERIOR APPROACH;  Surgeon: Gary Bucker, MD;  Location: [facility];  Service: Orthopedics;  Laterality: Right;  Marland Kitchen VASCULAR SURGERY      Home Medications:  Allergies as of 10/25/2019      Reactions   Codeine Anaphylaxis   Morphine Anaphylaxis   Gabapentin Other (See Comments)   Unsure of what the reaction was   Stadol [butorphanol]    Unknown   Lyrica [pregabalin] Rash   Tapentadol Nausea Only   ONSET 01/04/2013   Tramadol    Hallucinations "I see giant spiders on ceiling". Pt also believes he is in [facility], but he is in [place].      Medication List       Accurate as of October 25, 2019 10:54 AM.  If you have any questions, ask your nurse or doctor.        STOP taking these medications   ciprofloxacin 250 MG tablet Commonly known as: Cipro Stopped by: Gary Risser, PA-C   fluconazole 100 MG tablet Commonly known as: DIFLUCAN Stopped by: Gary Martorano, PA-C   sulfamethoxazole-trimethoprim 800-160 MG tablet Commonly known as: BACTRIM DS Stopped by: Gary Gandolfi, PA-C     TAKE these medications   acetaminophen 325 MG tablet Commonly known as: TYLENOL Take 2 tablets (650 mg total) by mouth every 6 (six) hours as needed for mild pain (or Fever >/= 101).   albuterol (2.5 MG/3ML) 0.083% nebulizer solution Commonly known as: PROVENTIL Take 3 mLs (2.5 mg total) by nebulization every 4 (four) hours as needed for wheezing or shortness of breath.   allopurinol 100 MG tablet Commonly known as: ZYLOPRIM Take 0.5 tablets (50 mg total) by mouth every other day.   amoxicillin 875 MG tablet Commonly known as: AMOXIL Take by mouth.   atorvastatin 20 MG tablet Commonly known as: LIPITOR Take 1 tablet (20 mg total) by mouth at bedtime.   budesonide 0.5 MG/2ML nebulizer solution Commonly known as: PULMICORT Take 2 mLs (0.5 mg total) by nebulization 2 (two) times daily.   Cholecalciferol 100 MCG (4000 UT) Caps Take 1 capsule by mouth daily.   DERMACLOUD EX Apply liberal amount topically as needed to area of skin irritation.  Ok to leave at bedside   furosemide 40 MG tablet Commonly known as: LASIX Take 1 tablet (40 mg total) by mouth at bedtime.   insulin aspart 100 UNIT/ML injection Commonly known as: novoLOG Inject 0-5 Units into the skin at bedtime. Correction coverage: HS scale CBG<70: Implement hypoglycemia protocol. CBG 70-120: 0 Units, CBG 121-150: 0 Units, CBG 151-200: 0 Units, CBG 201-250: 2 Units, CBG 251-300: 3 Units, CBG 301-350: 4 Units, CBG 351-400: 5 Units, CBG >400: Call MD and obtain STAT lab verification.   ipratropium-albuterol 0.5-2.5 (3) MG/3ML  Soln Commonly known as: DUONEB Take 3 mLs by nebulization 2 (two) times daily.   loperamide 2 MG tablet Commonly known as: IMODIUM A-D Take 2 mg by mouth 4 (four) times daily as needed for diarrhea or loose stools.   metoprolol succinate 100 MG 24 hr tablet Commonly known as: TOPROL-XL Take 1 tablet (100 mg total) by mouth at bedtime.   mirtazapine 15 MG tablet Commonly known as: REMERON Take 15 mg by mouth at bedtime.   multivitamin-lutein Caps capsule Take 1 capsule by mouth daily.   NON FORMULARY Diet Type: NAS. NCS   NovoLOG Mix 70/30 FlexPen (70-30) 100 UNIT/ML FlexPen Generic drug: insulin aspart protamine - aspart Inject 0.25 mLs (25 Units total) into the skin 2 (two) times daily with a meal.   nystatin powder Commonly known as: MYCOSTATIN/NYSTOP Apply topically as  directed. Apply with each ostomy change as instructed   omeprazole 40 MG capsule Commonly known as: PRILOSEC Take 1 capsule (40 mg total) by mouth daily.   sertraline 100 MG tablet Commonly known as: ZOLOFT Take 1 tablet (100 mg total) by mouth at bedtime.   tiotropium 18 MCG inhalation capsule Commonly known as: SPIRIVA Place 18 mcg into inhaler and inhale daily.       Allergies:  Allergies  Allergen Reactions  . Codeine Anaphylaxis  . Morphine Anaphylaxis  . Gabapentin Other (See Comments)    Unsure of what the reaction was   . Stadol [Butorphanol]     Unknown  . Lyrica [Pregabalin] Rash  . Tapentadol Nausea Only    ONSET 01/04/2013  . Tramadol     Hallucinations "I see giant spiders on ceiling". Pt also believes he is in [facility], but he is in [place].    Family History: Family History  Problem Relation Age of Onset  . Heart attack Father   . Stroke Father   . Heart disease Father   . Heart attack Mother     Social History:  reports that he quit smoking about 67 years ago. His smoking use included cigarettes. He has a 7.00 pack-year smoking history. He has quit using smokeless  tobacco. He reports that he does not drink alcohol or use drugs.  ROS: UROLOGY Frequent Urination?: No Hard to postpone urination?: No Burning/pain with urination?: No Get up at night to urinate?: No Leakage of urine?: No Urine stream starts and stops?: No Trouble starting stream?: No Do you have to strain to urinate?: No Blood in urine?: No Urinary tract infection?: No Sexually transmitted disease?: No Injury to kidneys or bladder?: No Painful intercourse?: No Weak stream?: No Erection problems?: No Penile pain?: No  Gastrointestinal Nausea?: No Vomiting?: No Indigestion/heartburn?: No Diarrhea?: No Constipation?: No  Constitutional Fever: No Night sweats?: No Weight loss?: No Fatigue?: No  Skin Skin rash/lesions?: No Itching?: No  Eyes Blurred vision?: No Double vision?: No  Ears/Nose/Throat Sore throat?: No Sinus problems?: No  Hematologic/Lymphatic Swollen glands?: No Easy bruising?: No  Cardiovascular Leg swelling?: Yes Chest pain?: No  Respiratory Cough?: No Shortness of breath?: No  Endocrine Excessive thirst?: No  Musculoskeletal Back pain?: Yes Joint pain?: No  Neurological Headaches?: No Dizziness?: No  Psychologic Depression?: No Anxiety?: No  Physical Exam: BP 125/60   Pulse 63   Ht 5\' 9"  (1.753 m)   Wt 214 lb (97.1 kg)   BMI 31.60 kg/m   Constitutional:  Well nourished. Alert and oriented, No acute distress. HEENT: [place] AT, mask in place.  Trachea midline, no masses. Cardiovascular: No clubbing, cyanosis, or edema. Respiratory: Normal respiratory effort, no increased work of breathing. GI: Abdomen is soft, non tender, non distended, no abdominal masses. Liver and spleen not palpable.  No hernias appreciated.  Stool sample for occult testing is not indicated.  Stoma in pink and healthy.  No further rash seen around the appliance.  As he is doing well, wife prefers to change the appliance at home. GU: No CVA tenderness.  No  bladder fullness or masses.   Neurologic: Grossly intact, no focal deficits, moving all 4 extremities. Psychiatric: Normal mood and affect.  Laboratory Data: Lab Results  Component Value Date   WBC 8.1 02/10/2019   HGB 9.0 (L) 02/10/2019   HCT 30.6 (L) 02/10/2019   MCV 82.7 02/10/2019   PLT 164 02/10/2019    Lab Results  Component Value Date  CREATININE 1.23 02/10/2019    No results found for: PSA  No results found for: TESTOSTERONE  Lab Results  Component Value Date   HGBA1C 5.2 02/07/2018    No results found for: TSH  No results found for: CHOL, HDL, CHOLHDL, VLDL, LDLCALC  Lab Results  Component Value Date   AST 25 10/06/2018   Lab Results  Component Value Date   ALT 14 10/06/2018   No components found for: ALKALINEPHOPHATASE No components found for: BILIRUBINTOTAL  No results found for: ESTRADIOL  Urinalysis Component     Latest Ref Rng & Units 09/18/2019  Color, Urine     YELLOW YELLOW  Appearance     CLEAR CLEAR  Specific Gravity, Urine     1.005 - 1.030 1.020  pH     5.0 - 8.0 6.5  Glucose, UA     NEGATIVE mg/dL NEGATIVE  Hgb urine dipstick     NEGATIVE SMALL (A)  Bilirubin Urine     NEGATIVE NEGATIVE  Ketones, ur     NEGATIVE mg/dL NEGATIVE  Protein     NEGATIVE mg/dL 30 (A)  Nitrite     NEGATIVE NEGATIVE  Leukocytes,Ua     NEGATIVE SMALL (A)  RBC / HPF     0 - 5 RBC/hpf 6-10  WBC, UA     0 - 5 WBC/hpf 6-10  Bacteria, UA     NONE SEEN NONE SEEN  Squamous Epithelial / LPF     0 - 5 NONE SEEN    I have reviewed the labs.   Assessment & Plan:    1. Stoma issues Stoma itself appears healthy Rash has resolved   Return if symptoms worsen or fail to improve.  These notes generated with voice recognition software. I apologize for typographical errors.  Michiel Cowboy, PA-C  [facility] [address]  [address] [place], [place] [address] [phone]

## 2019-10-25 ENCOUNTER — Encounter: Payer: Self-pay | Admitting: Urology

## 2019-10-25 ENCOUNTER — Other Ambulatory Visit: Payer: Self-pay

## 2019-10-25 ENCOUNTER — Ambulatory Visit (INDEPENDENT_AMBULATORY_CARE_PROVIDER_SITE_OTHER): Payer: Medicare Other | Admitting: Urology

## 2019-10-25 VITALS — BP 125/60 | HR 63 | Ht 69.0 in | Wt 214.0 lb

## 2019-10-25 DIAGNOSIS — L309 Dermatitis, unspecified: Secondary | ICD-10-CM | POA: Diagnosis not present

## 2019-11-17 ENCOUNTER — Encounter: Payer: Self-pay | Admitting: Internal Medicine

## 2019-11-17 ENCOUNTER — Other Ambulatory Visit: Payer: Self-pay

## 2019-11-17 ENCOUNTER — Emergency Department: Payer: Medicare Other

## 2019-11-17 ENCOUNTER — Inpatient Hospital Stay
Admission: EM | Admit: 2019-11-17 | Discharge: 2019-11-20 | DRG: 871 | Disposition: A | Discharge status: Home / Self Care | Payer: Medicare Other | Attending: Internal Medicine | Admitting: Internal Medicine

## 2019-11-17 DIAGNOSIS — E1151 Type 2 diabetes mellitus with diabetic peripheral angiopathy without gangrene: Secondary | ICD-10-CM | POA: Diagnosis present

## 2019-11-17 DIAGNOSIS — Z993 Dependence on wheelchair: Secondary | ICD-10-CM

## 2019-11-17 DIAGNOSIS — Z8619 Personal history of other infectious and parasitic diseases: Secondary | ICD-10-CM

## 2019-11-17 DIAGNOSIS — E1122 Type 2 diabetes mellitus with diabetic chronic kidney disease: Secondary | ICD-10-CM | POA: Diagnosis present

## 2019-11-17 DIAGNOSIS — J189 Pneumonia, unspecified organism: Secondary | ICD-10-CM | POA: Diagnosis present

## 2019-11-17 DIAGNOSIS — Z888 Allergy status to other drugs, medicaments and biological substances status: Secondary | ICD-10-CM

## 2019-11-17 DIAGNOSIS — Z8673 Personal history of transient ischemic attack (TIA), and cerebral infarction without residual deficits: Secondary | ICD-10-CM | POA: Diagnosis not present

## 2019-11-17 DIAGNOSIS — K219 Gastro-esophageal reflux disease without esophagitis: Secondary | ICD-10-CM | POA: Diagnosis present

## 2019-11-17 DIAGNOSIS — Z6831 Body mass index (BMI) 31.0-31.9, adult: Secondary | ICD-10-CM

## 2019-11-17 DIAGNOSIS — E669 Obesity, unspecified: Secondary | ICD-10-CM | POA: Diagnosis present

## 2019-11-17 DIAGNOSIS — Z9841 Cataract extraction status, right eye: Secondary | ICD-10-CM

## 2019-11-17 DIAGNOSIS — I129 Hypertensive chronic kidney disease with stage 1 through stage 4 chronic kidney disease, or unspecified chronic kidney disease: Secondary | ICD-10-CM | POA: Diagnosis present

## 2019-11-17 DIAGNOSIS — E785 Hyperlipidemia, unspecified: Secondary | ICD-10-CM | POA: Diagnosis present

## 2019-11-17 DIAGNOSIS — F329 Major depressive disorder, single episode, unspecified: Secondary | ICD-10-CM | POA: Diagnosis present

## 2019-11-17 DIAGNOSIS — M1A30X0 Chronic gout due to renal impairment, unspecified site, without tophus (tophi): Secondary | ICD-10-CM | POA: Diagnosis present

## 2019-11-17 DIAGNOSIS — A419 Sepsis, unspecified organism: Secondary | ICD-10-CM | POA: Diagnosis present

## 2019-11-17 DIAGNOSIS — Z823 Family history of stroke: Secondary | ICD-10-CM

## 2019-11-17 DIAGNOSIS — Z79899 Other long term (current) drug therapy: Secondary | ICD-10-CM

## 2019-11-17 DIAGNOSIS — Z87891 Personal history of nicotine dependence: Secondary | ICD-10-CM

## 2019-11-17 DIAGNOSIS — Z86711 Personal history of pulmonary embolism: Secondary | ICD-10-CM

## 2019-11-17 DIAGNOSIS — J9601 Acute respiratory failure with hypoxia: Secondary | ICD-10-CM | POA: Diagnosis present

## 2019-11-17 DIAGNOSIS — E1169 Type 2 diabetes mellitus with other specified complication: Secondary | ICD-10-CM | POA: Diagnosis present

## 2019-11-17 DIAGNOSIS — Z961 Presence of intraocular lens: Secondary | ICD-10-CM | POA: Diagnosis present

## 2019-11-17 DIAGNOSIS — Z96641 Presence of right artificial hip joint: Secondary | ICD-10-CM | POA: Diagnosis present

## 2019-11-17 DIAGNOSIS — J441 Chronic obstructive pulmonary disease with (acute) exacerbation: Secondary | ICD-10-CM

## 2019-11-17 DIAGNOSIS — Z794 Long term (current) use of insulin: Secondary | ICD-10-CM

## 2019-11-17 DIAGNOSIS — J440 Chronic obstructive pulmonary disease with acute lower respiratory infection: Secondary | ICD-10-CM | POA: Diagnosis present

## 2019-11-17 DIAGNOSIS — Z20822 Contact with and (suspected) exposure to covid-19: Secondary | ICD-10-CM | POA: Diagnosis present

## 2019-11-17 DIAGNOSIS — Z885 Allergy status to narcotic agent status: Secondary | ICD-10-CM

## 2019-11-17 DIAGNOSIS — Z8701 Personal history of pneumonia (recurrent): Secondary | ICD-10-CM

## 2019-11-17 DIAGNOSIS — Z96651 Presence of right artificial knee joint: Secondary | ICD-10-CM | POA: Diagnosis present

## 2019-11-17 DIAGNOSIS — R0602 Shortness of breath: Secondary | ICD-10-CM | POA: Diagnosis present

## 2019-11-17 DIAGNOSIS — Z8249 Family history of ischemic heart disease and other diseases of the circulatory system: Secondary | ICD-10-CM

## 2019-11-17 DIAGNOSIS — Z9842 Cataract extraction status, left eye: Secondary | ICD-10-CM | POA: Diagnosis not present

## 2019-11-17 DIAGNOSIS — Z9049 Acquired absence of other specified parts of digestive tract: Secondary | ICD-10-CM | POA: Diagnosis not present

## 2019-11-17 DIAGNOSIS — N1832 Chronic kidney disease, stage 3b: Secondary | ICD-10-CM | POA: Diagnosis present

## 2019-11-17 DIAGNOSIS — Z7951 Long term (current) use of inhaled steroids: Secondary | ICD-10-CM

## 2019-11-17 LAB — URINALYSIS, COMPLETE (UACMP) WITH MICROSCOPIC
Bilirubin Urine: NEGATIVE
Cystine Crys, UA: POSITIVE
Glucose, UA: NEGATIVE mg/dL
Hgb urine dipstick: NEGATIVE
Ketones, ur: NEGATIVE mg/dL
Nitrite: NEGATIVE
Protein, ur: 100 mg/dL — AB
Specific Gravity, Urine: 1.017 (ref 1.005–1.030)
pH: 8 (ref 5.0–8.0)

## 2019-11-17 LAB — CBC WITH DIFFERENTIAL/PLATELET
Abs Immature Granulocytes: 0.04 10*3/uL (ref 0.00–0.07)
Basophils Absolute: 0 10*3/uL (ref 0.0–0.1)
Basophils Relative: 0 %
Eosinophils Absolute: 0 10*3/uL (ref 0.0–0.5)
Eosinophils Relative: 0 %
HCT: 45.5 % (ref 39.0–52.0)
Hemoglobin: 14.5 g/dL (ref 13.0–17.0)
Immature Granulocytes: 1 %
Lymphocytes Relative: 9 %
Lymphs Abs: 0.4 10*3/uL — ABNORMAL LOW (ref 0.7–4.0)
MCH: 29.3 pg (ref 26.0–34.0)
MCHC: 31.9 g/dL (ref 30.0–36.0)
MCV: 91.9 fL (ref 80.0–100.0)
Monocytes Absolute: 0.1 10*3/uL (ref 0.1–1.0)
Monocytes Relative: 3 %
Neutro Abs: 3.9 10*3/uL (ref 1.7–7.7)
Neutrophils Relative %: 87 %
Platelets: 117 10*3/uL — ABNORMAL LOW (ref 150–400)
RBC: 4.95 MIL/uL (ref 4.22–5.81)
RDW: 13.7 % (ref 11.5–15.5)
WBC: 4.5 10*3/uL (ref 4.0–10.5)
nRBC: 0 % (ref 0.0–0.2)

## 2019-11-17 LAB — PROCALCITONIN: Procalcitonin: 0.11 ng/mL

## 2019-11-17 LAB — RESPIRATORY PANEL BY RT PCR (FLU A&B, COVID)
Influenza A by PCR: NEGATIVE
Influenza B by PCR: NEGATIVE
SARS Coronavirus 2 by RT PCR: NEGATIVE

## 2019-11-17 LAB — COMPREHENSIVE METABOLIC PANEL
ALT: 17 U/L (ref 0–44)
AST: 26 U/L (ref 15–41)
Albumin: 3.7 g/dL (ref 3.5–5.0)
Alkaline Phosphatase: 74 U/L (ref 38–126)
Anion gap: 10 (ref 5–15)
BUN: 55 mg/dL — ABNORMAL HIGH (ref 8–23)
CO2: 24 mmol/L (ref 22–32)
Calcium: 9.1 mg/dL (ref 8.9–10.3)
Chloride: 106 mmol/L (ref 98–111)
Creatinine, Ser: 1.85 mg/dL — ABNORMAL HIGH (ref 0.61–1.24)
GFR calc Af Amer: 38 mL/min — ABNORMAL LOW (ref >60)
GFR calc non Af Amer: 32 mL/min — ABNORMAL LOW (ref >60)
Glucose, Bld: 276 mg/dL — ABNORMAL HIGH (ref 70–99)
Potassium: 5 mmol/L (ref 3.5–5.1)
Sodium: 140 mmol/L (ref 135–145)
Total Bilirubin: 0.5 mg/dL (ref 0.3–1.2)
Total Protein: 7.2 g/dL (ref 6.5–8.1)

## 2019-11-17 LAB — LIPASE, BLOOD: Lipase: 33 U/L (ref 11–51)

## 2019-11-17 LAB — APTT: aPTT: 28 seconds (ref 24–36)

## 2019-11-17 LAB — LACTIC ACID, PLASMA
Lactic Acid, Venous: 2 mmol/L (ref 0.5–1.9)
Lactic Acid, Venous: 3.7 mmol/L (ref 0.5–1.9)
Lactic Acid, Venous: 5.5 mmol/L (ref 0.5–1.9)
Lactic Acid, Venous: 4.3 mmol/L (ref 0.5–1.9)

## 2019-11-17 LAB — PROTIME-INR
INR: 1 (ref 0.8–1.2)
Prothrombin Time: 13 seconds (ref 11.4–15.2)

## 2019-11-17 LAB — GLUCOSE, CAPILLARY
Glucose-Capillary: 161 mg/dL — ABNORMAL HIGH (ref 70–99)
Glucose-Capillary: 271 mg/dL — ABNORMAL HIGH (ref 70–99)
Glucose-Capillary: 276 mg/dL — ABNORMAL HIGH (ref 70–99)

## 2019-11-17 LAB — POC SARS CORONAVIRUS 2 AG: SARS Coronavirus 2 Ag: NEGATIVE

## 2019-11-17 MED ORDER — ALLOPURINOL 100 MG PO TABS
50.0000 mg | ORAL_TABLET | ORAL | Status: DC
  Administered 2019-11-18 – 2019-11-20 (×2): 50 mg via ORAL
  Filled 2019-11-17 (×2): qty 0.5

## 2019-11-17 MED ORDER — ACETAMINOPHEN 325 MG PO TABS: 650.0000 mg | ORAL_TABLET | Freq: Four times a day (QID) | ORAL | Status: DC | PRN

## 2019-11-17 MED ORDER — BUDESONIDE 0.5 MG/2ML IN SUSP
0.5000 mg | Freq: Two times a day (BID) | RESPIRATORY_TRACT | Status: DC
  Administered 2019-11-17 – 2019-11-20 (×6): 0.5 mg via RESPIRATORY_TRACT
  Filled 2019-11-17 (×6): qty 2

## 2019-11-17 MED ORDER — ACETAMINOPHEN 650 MG RE SUPP: 650.0000 mg | Freq: Four times a day (QID) | RECTAL | Status: DC | PRN

## 2019-11-17 MED ORDER — METHYLPREDNISOLONE SODIUM SUCC 125 MG IJ SOLR
125.0000 mg | Freq: Once | INTRAMUSCULAR | Status: AC
  Administered 2019-11-17: 12:00:00 125 mg via INTRAVENOUS
  Filled 2019-11-17: qty 2

## 2019-11-17 MED ORDER — TRAZODONE HCL 50 MG PO TABS
50.0000 mg | ORAL_TABLET | Freq: Every evening | ORAL | Status: DC | PRN
  Administered 2019-11-17: 50 mg via ORAL
  Filled 2019-11-17: qty 1

## 2019-11-17 MED ORDER — TIOTROPIUM BROMIDE MONOHYDRATE 18 MCG IN CAPS
18.0000 ug | ORAL_CAPSULE | Freq: Every day | RESPIRATORY_TRACT | Status: DC
  Administered 2019-11-18 – 2019-11-20 (×3): 18 ug via RESPIRATORY_TRACT
  Filled 2019-11-17: qty 5

## 2019-11-17 MED ORDER — SODIUM CHLORIDE 0.9 % IV SOLN
2.0000 g | Freq: Two times a day (BID) | INTRAVENOUS | Status: DC
  Administered 2019-11-17 – 2019-11-20 (×6): 2 g via INTRAVENOUS
  Filled 2019-11-17 (×8): qty 2

## 2019-11-17 MED ORDER — SODIUM CHLORIDE 0.9 % IV SOLN
2.0000 g | Freq: Once | INTRAVENOUS | Status: AC
  Administered 2019-11-17: 12:00:00 2 g via INTRAVENOUS
  Filled 2019-11-17: qty 2

## 2019-11-17 MED ORDER — MIRTAZAPINE 15 MG PO TABS
15.0000 mg | ORAL_TABLET | Freq: Every day | ORAL | Status: DC
  Administered 2019-11-17 – 2019-11-19 (×3): 15 mg via ORAL
  Filled 2019-11-17 (×3): qty 1

## 2019-11-17 MED ORDER — IPRATROPIUM-ALBUTEROL 0.5-2.5 (3) MG/3ML IN SOLN
3.0000 mL | Freq: Once | RESPIRATORY_TRACT | Status: AC
  Administered 2019-11-17: 12:00:00 3 mL via RESPIRATORY_TRACT
  Filled 2019-11-17: qty 3

## 2019-11-17 MED ORDER — INSULIN ASPART 100 UNIT/ML ~~LOC~~ SOLN
0.0000 [IU] | Freq: Three times a day (TID) | SUBCUTANEOUS | Status: DC
  Administered 2019-11-17: 18:00:00 8 [IU] via SUBCUTANEOUS
  Administered 2019-11-19: 2 [IU] via SUBCUTANEOUS
  Filled 2019-11-17 (×2): qty 1

## 2019-11-17 MED ORDER — SODIUM CHLORIDE 0.9 % IV BOLUS
2000.0000 mL | Freq: Once | INTRAVENOUS | Status: AC
  Administered 2019-11-17: 2000 mL via INTRAVENOUS

## 2019-11-17 MED ORDER — ONDANSETRON HCL 4 MG/2ML IJ SOLN: 4.0000 mg | Freq: Four times a day (QID) | INTRAMUSCULAR | Status: DC | PRN

## 2019-11-17 MED ORDER — SODIUM CHLORIDE 0.9 % IV BOLUS
1000.0000 mL | Freq: Once | INTRAVENOUS | Status: AC
  Administered 2019-11-17: 12:00:00 1000 mL via INTRAVENOUS

## 2019-11-17 MED ORDER — ENOXAPARIN SODIUM 40 MG/0.4ML ~~LOC~~ SOLN
40.0000 mg | SUBCUTANEOUS | Status: DC
  Administered 2019-11-17 – 2019-11-19 (×3): 40 mg via SUBCUTANEOUS
  Filled 2019-11-17 (×3): qty 0.4

## 2019-11-17 MED ORDER — SERTRALINE HCL 50 MG PO TABS
100.0000 mg | ORAL_TABLET | Freq: Every day | ORAL | Status: DC
  Administered 2019-11-17 – 2019-11-19 (×3): 100 mg via ORAL
  Filled 2019-11-17 (×3): qty 2

## 2019-11-17 MED ORDER — ALBUTEROL SULFATE (2.5 MG/3ML) 0.083% IN NEBU: 2.5000 mg | INHALATION_SOLUTION | RESPIRATORY_TRACT | Status: DC | PRN

## 2019-11-17 MED ORDER — METOPROLOL SUCCINATE ER 50 MG PO TB24
100.0000 mg | ORAL_TABLET | Freq: Every day | ORAL | Status: DC
  Administered 2019-11-17 – 2019-11-19 (×3): 100 mg via ORAL
  Filled 2019-11-17 (×3): qty 2

## 2019-11-17 MED ORDER — ONDANSETRON HCL 4 MG PO TABS: 4.0000 mg | ORAL_TABLET | Freq: Four times a day (QID) | ORAL | Status: DC | PRN

## 2019-11-17 MED ORDER — VANCOMYCIN HCL IN DEXTROSE 1-5 GM/200ML-% IV SOLN
1000.0000 mg | INTRAVENOUS | Status: DC
  Administered 2019-11-17: 20:00:00 1000 mg via INTRAVENOUS
  Filled 2019-11-17: qty 200

## 2019-11-17 MED ORDER — ALBUTEROL SULFATE (2.5 MG/3ML) 0.083% IN NEBU
5.0000 mg | INHALATION_SOLUTION | Freq: Once | RESPIRATORY_TRACT | Status: AC
  Administered 2019-11-17: 5 mg via RESPIRATORY_TRACT
  Filled 2019-11-17: qty 6

## 2019-11-17 MED ORDER — IOHEXOL 350 MG/ML SOLN
75.0000 mL | Freq: Once | INTRAVENOUS | Status: AC | PRN
  Administered 2019-11-17: 14:00:00 60 mL via INTRAVENOUS

## 2019-11-17 MED ORDER — ATORVASTATIN CALCIUM 20 MG PO TABS
20.0000 mg | ORAL_TABLET | Freq: Every day | ORAL | Status: DC
  Administered 2019-11-17 – 2019-11-19 (×3): 20 mg via ORAL
  Filled 2019-11-17 (×3): qty 1

## 2019-11-17 MED ORDER — VANCOMYCIN HCL IN DEXTROSE 1-5 GM/200ML-% IV SOLN
1000.0000 mg | Freq: Once | INTRAVENOUS | Status: AC
  Administered 2019-11-17: 12:00:00 1000 mg via INTRAVENOUS
  Filled 2019-11-17: qty 200

## 2019-11-17 MED ORDER — PANTOPRAZOLE SODIUM 40 MG PO TBEC
40.0000 mg | DELAYED_RELEASE_TABLET | Freq: Every day | ORAL | Status: DC
  Administered 2019-11-18 – 2019-11-20 (×3): 40 mg via ORAL
  Filled 2019-11-17 (×3): qty 1

## 2019-11-17 NOTE — ED Notes (Signed)
Date and time results received: 11/17/19 11:41 AM  (use smartphrase ".now" to insert current time)  Test: Lactic Critical Value: 2.0  Name of Provider Notified: Dr. Scotty Court  Orders Received? Or Actions Taken?: No new orders at this time

## 2019-11-17 NOTE — ED Notes (Signed)
Belongings placed in pt bag: Glasses Grey watch

## 2019-11-17 NOTE — ED Notes (Signed)
This RN spoke with pt's wife. Wife updated on pt care that has been received and plan of care.

## 2019-11-17 NOTE — Progress Notes (Signed)
CODE SEPSIS - PHARMACY COMMUNICATION  **Broad Spectrum Antibiotics should be administered within 1 hour of Sepsis diagnosis**  Time Code Sepsis Called/Page Received: 1119 per consult  Antibiotics Ordered: cefepime and vanc  Time of 1st antibiotic administration: 1151  Additional action taken by pharmacy:   If necessary, Name of Provider/Nurse Contacted:     Marty Heck ,PharmD Clinical Pharmacist  11/17/2019  12:15 PM

## 2019-11-17 NOTE — ED Triage Notes (Signed)
Pt to ED via [facility] from home. Pt c/o of increasing SOB and was recommended to come in by PCP. Pt recently dx with pneumonia. Per EMS pt was 81% on RA. Pt placed on 4L [place] with little improvement. Pt placed on CPAP by EMS. In route EMS administered neb. Per EMS no oxygen use at home.   EMS Vitals:  98.2 Axillary CO2 33 CBG 252  Upon arrival pt A&Ox4. Respiratory at bedside to switch to BiPAP. Oxygen saturation 95%.   Pt labored breathing, tachycardic and tachypnea.

## 2019-11-17 NOTE — ED Provider Notes (Signed)
[facility] Emergency Department Provider Note  ____________________________________________  Time seen: Approximately 11:30 AM  I have reviewed the triage vital signs and the nursing notes.   HISTORY  Chief Complaint Shortness of Breath    HPI Gary Thomas is a 86 y.o. male with a history of diabetes, GERD, hypertension, PE, stroke, COPD who comes to the ED complaining of worsening shortness of breath.   Reviewed electronic medical records, it appears that the patient has been struggling with shortness of breath for the past several weeks.  He had some episodes of choking on food and vomiting that were concerning for aspiration and some abnormal chest x-rays, so he completed a course of amoxicillin, and then was subsequently treated with Levaquin.  He went back to his primary care yesterday with persistent shortness of breath and cough, but also fatigue, nausea, vomiting.  He was started on Augmentin and Tessalon and prednisone.  Since that visit, he is continued worsening at home and today felt like he suddenly was much more short of breath.  EMS found the patient to have a room air oxygen saturation of 81% and placed him on CPAP for transport.  He is placed on BiPAP on arrival in the ED due to respiratory distress.  Symptoms are constant, no aggravating or alleviating factors.  Denies chest pain.     Past Medical History:  Diagnosis Date  . Depression   . Diabetes mellitus without complication (HCC)   . Dyspnea   . Elevated lipids   . Femur fracture (HCC) 08/22/2018  . GERD (gastroesophageal reflux disease)   . Hypertension   . Pulmonary emboli (HCC)   . Renal insufficiency    only has 1 kidney  . Sleep apnea   . Stroke (HCC)    Rt side weakness     Patient Active Problem List   Diagnosis Date Noted  . GI bleed 02/07/2019  . Postoperative wound infection 10/17/2018  . Supracondylar fracture of distal end of femur without intracondylar  extension (HCC) 10/17/2018  . Sepsis (HCC) 10/06/2018  . Chronic pulmonary embolism (HCC) 09/06/2018  . Primary osteoarthritis of right knee 09/06/2018  . Status post right knee replacement 09/06/2018  . Dyslipidemia associated with type 2 diabetes mellitus (HCC) 09/06/2018  . CKD stage 3 due to type 2 diabetes mellitus (HCC) 09/06/2018  . Controlled type 2 diabetes mellitus with stage 3 chronic kidney disease, with long-term current use of insulin (HCC) 09/06/2018  . Chronic cerebrovascular accident (CVA) 09/06/2018  . GERD without esophagitis 09/06/2018  . Closed nondisplaced supracondylar fracture of distal end of femur without intracondylar extension (HCC) 09/06/2018  . Acute on chronic anemia 09/06/2018  . Depression, major, single episode, in partial remission (HCC) 09/06/2018  . Bilateral lower extremity edema 09/06/2018  . Chronic gout due to renal impairment 09/06/2018  . COPD with respiratory failure, acute (HCC) 08/28/2018  . Spondylolisthesis of lumbar region 02/15/2018  . PAD (peripheral artery disease) (HCC) 08/19/2017  . Hyperlipidemia 07/05/2017  . Essential hypertension 07/05/2017  . Bilateral carotid artery stenosis 07/05/2017  . Primary localized osteoarthritis of right hip 03/02/2017     Past Surgical History:  Procedure Laterality Date  . 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 AORTA SURGERY    . CAROTID ARTERY - SUBCLAVIAN ARTERY BYPASS GRAFT Bilateral   . CATARACT EXTRACTION W/ INTRAOCULAR LENS  IMPLANT, BILATERAL    . CHOLECYSTECTOMY    . EYE SURGERY    .  FEMUR FRACTURE SURGERY Right   . HIP FRACTURE SURGERY Bilateral   . I & D EXTREMITY Right 10/18/2018   Procedure: IRRIGATION AND DEBRIDEMENT EXTREMITY, POSSIBLE HARDWARE REMOVAL OF SUPERCONDYLAR PLATE;  Surgeon: Christena Flake, MD;  Location: [facility] ORS;  Service: Orthopedics;  Laterality: Right;  . JOINT REPLACEMENT    . ORIF FEMUR FRACTURE Right 08/24/2018   Procedure:  OPEN REDUCTION INTERNAL FIXATION (ORIF) SUPRACONDYLAR FEMUR FRACTURE;  Surgeon: Christena Flake, MD;  Location: [facility] ORS;  Service: Orthopedics;  Laterality: Right;  . ORIF FEMUR FRACTURE Right 10/18/2018   Procedure: HARDWARE REMOVAL- DISTAL FEMUR FRACTURE FIXATION;  Surgeon: Christena Flake, MD;  Location: [facility] ORS;  Service: Orthopedics;  Laterality: Right;  . PROSTATE SURGERY    . REPLACEMENT TOTAL KNEE Right   . TOTAL HIP ARTHROPLASTY Right 03/02/2017   Procedure: TOTAL HIP ARTHROPLASTY ANTERIOR APPROACH;  Surgeon: Kennedy Bucker, MD;  Location: [facility] ORS;  Service: Orthopedics;  Laterality: Right;  Marland Kitchen VASCULAR SURGERY       Prior to Admission medications   Medication Sig Start Date End Date Taking? Authorizing Provider  acetaminophen (TYLENOL) 325 MG tablet Take 2 tablets (650 mg total) by mouth every 6 (six) hours as needed for mild pain (or Fever >/= 101). 08/30/18   Gouru, Deanna Artis, MD  albuterol (PROVENTIL) (2.5 MG/3ML) 0.083% nebulizer solution Take 3 mLs (2.5 mg total) by nebulization every 4 (four) hours as needed for wheezing or shortness of breath. 09/14/18   Sharee Holster, NP  allopurinol (ZYLOPRIM) 100 MG tablet Take 0.5 tablets (50 mg total) by mouth every other day. 09/14/18   Sharee Holster, NP  atorvastatin (LIPITOR) 20 MG tablet Take 1 tablet (20 mg total) by mouth at bedtime. 09/14/18   Sharee Holster, NP  budesonide (PULMICORT) 0.5 MG/2ML nebulizer solution Take 2 mLs (0.5 mg total) by nebulization 2 (two) times daily. 09/14/18   Sharee Holster, NP  Cholecalciferol 100 MCG (4000 UT) CAPS Take 1 capsule by mouth daily. 08/31/18   [provider]  furosemide (LASIX) 40 MG tablet Take 1 tablet (40 mg total) by mouth at bedtime. 09/14/18   Sharee Holster, NP  Infant [employer] Products [facility] EX) Apply liberal amount topically as needed to area of skin irritation.  Ok to leave at bedside 09/02/18   [provider]  insulin aspart (NOVOLOG) 100 UNIT/ML injection  Inject 0-5 Units into the skin at bedtime. Correction coverage: HS scale CBG<70: Implement hypoglycemia protocol. CBG 70-120: 0 Units, CBG 121-150: 0 Units, CBG 151-200: 0 Units, CBG 201-250: 2 Units, CBG 251-300: 3 Units, CBG 301-350: 4 Units, CBG 351-400: 5 Units, CBG >400: Call MD and obtain STAT lab verification.    [provider]  ipratropium-albuterol (DUONEB) 0.5-2.5 (3) MG/3ML SOLN Take 3 mLs by nebulization 2 (two) times daily. 09/14/18   Sharee Holster, NP  loperamide (IMODIUM A-D) 2 MG tablet Take 2 mg by mouth 4 (four) times daily as needed for diarrhea or loose stools. 09/07/18   [provider]  metoprolol succinate (TOPROL-XL) 100 MG 24 hr tablet Take 1 tablet (100 mg total) by mouth at bedtime. 09/14/18   Sharee Holster, NP  mirtazapine (REMERON) 15 MG tablet Take 15 mg by mouth at bedtime.    [provider]  multivitamin-lutein (OCUVITE-LUTEIN) CAPS capsule Take 1 capsule by mouth daily.    [provider]  NON FORMULARY Diet Type: NAS. NCS    [provider]  NOVOLOG MIX 70/30  FLEXPEN (70-30) 100 UNIT/ML FlexPen Inject 0.25 mLs (25 Units total) into the skin 2 (two) times daily with a meal. 02/10/19   Altamese Dilling, MD  nystatin (MYCOSTATIN/NYSTOP) powder Apply topically as directed. Apply with each ostomy change as instructed    [provider]  omeprazole (PRILOSEC) 40 MG capsule Take 1 capsule (40 mg total) by mouth daily. 09/14/18   Sharee Holster, NP  sertraline (ZOLOFT) 100 MG tablet Take 1 tablet (100 mg total) by mouth at bedtime. 09/14/18   Sharee Holster, NP  tiotropium (SPIRIVA) 18 MCG inhalation capsule Place 18 mcg into inhaler and inhale daily. 08/30/18   [provider]     Allergies Codeine, Morphine, Gabapentin, Stadol [butorphanol], Lyrica [pregabalin], Tapentadol, and Tramadol   Family History  Problem Relation Age of Onset  . Heart attack Father   . Stroke Father   . Heart disease  Father   . Heart attack Mother     Social History Social History   Tobacco Use  . Smoking status: Former Smoker    Packs/day: 1.00    Years: 7.00    Pack years: 7.00    Types: Cigarettes    Quit date: 02/18/1952    Years since quitting: 67.7  . Smokeless tobacco: Former Engineer, water Use Topics  . Alcohol use: No  . Drug use: No    Review of Systems  Constitutional:   No fever positive chills.  ENT:   No sore throat. No rhinorrhea. Cardiovascular:   No chest pain or syncope. Respiratory: Positive shortness of breath and cough. Gastrointestinal:   Negative for abdominal pain, positive vomiting Musculoskeletal:   Negative for focal pain or swelling All other systems reviewed and are negative except as documented above in ROS and HPI.  ____________________________________________   PHYSICAL EXAM:  VITAL SIGNS: ED Triage Vitals  Enc Vitals Group     BP 11/17/19 1114 (!) 121/46     Pulse Rate 11/17/19 1114 (!) 115     Resp 11/17/19 1114 (!) 23     Temp 11/17/19 1117 97.9 F (36.6 C)     Temp Source 11/17/19 1117 Axillary     SpO2 11/17/19 1114 95 %     Weight --      Height --      Head Circumference --      Peak Flow --      Pain Score 11/17/19 1110 0     Pain Loc --      Pain Edu? --      Excl. in GC? --     Vital signs reviewed, nursing assessments reviewed.   Constitutional:   Alert and oriented.  Ill-appearing . Eyes:   Conjunctivae are normal. EOMI. PERRL. ENT      Head:   Normocephalic and atraumatic.      Nose:   Normal      Mouth/Throat: BiPAP in place      Neck:   No meningismus. Full ROM.  No JVD, negative hepatojugular reflex Hematological/Lymphatic/Immunilogical:   No cervical lymphadenopathy. Cardiovascular:   Tachycardia heart rate 115. Symmetric bilateral radial and DP pulses.  No murmurs. Cap refill less than 2 seconds. Respiratory: Tachypnea, respiratory rate of about 26, accessory muscle use.  Diffuse end expiratory wheezing, bilateral  basilar crackles. Gastrointestinal:   Soft and nontender. Non distended. There is no CVA tenderness.  No rebound, rigidity, or guarding.  Musculoskeletal:   Normal range of motion in all extremities. No joint effusions.  No  lower extremity tenderness.  Trace peripheral edema bilateral lower extremities. Neurologic:   Normal speech and language.  Motor grossly intact. No acute focal neurologic deficits are appreciated.  Skin:    Skin is warm, dry and intact. No rash noted.  No petechiae, purpura, or bullae.  ____________________________________________    LABS (pertinent positives/negatives) (all labs ordered are listed, but only abnormal results are displayed) Labs Reviewed  LACTIC ACID, PLASMA - Abnormal; Notable for the following components:      Result Value   Lactic Acid, Venous 2.0 (*)    All other components within normal limits  LACTIC ACID, PLASMA - Abnormal; Notable for the following components:   Lactic Acid, Venous 3.7 (*)    All other components within normal limits  COMPREHENSIVE METABOLIC PANEL - Abnormal; Notable for the following components:   Glucose, Bld 276 (*)    BUN 55 (*)    Creatinine, Ser 1.85 (*)    GFR calc non Af Amer 32 (*)    GFR calc Af Amer 38 (*)    All other components within normal limits  CBC WITH DIFFERENTIAL/PLATELET - Abnormal; Notable for the following components:   Platelets 117 (*)    Lymphs Abs 0.4 (*)    All other components within normal limits  URINALYSIS, COMPLETE (UACMP) WITH MICROSCOPIC - Abnormal; Notable for the following components:   Color, Urine YELLOW (*)    APPearance CLOUDY (*)    Protein, ur 100 (*)    Leukocytes,Ua TRACE (*)    Bacteria, UA RARE (*)    Non Squamous Epithelial PRESENT (*)    All other components within normal limits  RESPIRATORY PANEL BY RT PCR (FLU A&B, COVID)  CULTURE, BLOOD (ROUTINE X 2)  CULTURE, BLOOD (ROUTINE X 2)  URINE CULTURE  LIPASE, BLOOD  PROCALCITONIN  APTT  PROTIME-INR  LACTIC  ACID, PLASMA  LACTIC ACID, PLASMA  POC SARS CORONAVIRUS 2 AG -  ED  POC SARS CORONAVIRUS 2 AG   ____________________________________________   EKG  Interpreted by me Sinus tachycardia rate 116, right axis, normal intervals.  Poor R wave progression.  Normal ST segments and T waves.  Artifact in the high lateral and inferior leads somewhat limits interpretation, but no evidence of STEMI or acute ischemic change  ____________________________________________    RADIOLOGY  CT Angio Chest PE W and/or Wo Contrast  Result Date: 11/17/2019 CLINICAL DATA:  Increasing shortness of breath. Hypoxemia. EXAM: CT ANGIOGRAPHY CHEST WITH CONTRAST TECHNIQUE: Multidetector CT imaging of the chest was performed using the standard protocol during bolus administration of intravenous contrast. Multiplanar CT image reconstructions and MIPs were obtained to evaluate the vascular anatomy. CONTRAST:  58mL OMNIPAQUE IOHEXOL 350 MG/ML SOLN COMPARISON:  Chest x-ray dated 11/17/2019 and chest CT dated 11/20/2008 FINDINGS: Cardiovascular: There are no pulmonary emboli. Aortic atherosclerosis. Enlarged main pulmonary arteries. Overall heart size is normal. No pericardial effusion. Mediastinum/Nodes: Chronic bilateral thyroid nodules, unchanged since 2010. No significant abnormality of the esophagus trachea. No hilar or mediastinal adenopathy. Lungs/Pleura: There small hazy infiltrates in the superior aspect of the left lower lobe. There is new atelectasis and consolidation in the posterior aspect of the right lower lobe. Increased chronic elevation of the right hemidiaphragm contributes to the atelectasis. No effusions. Upper Abdomen: No acute abnormality. Multiple hepatic cysts as demonstrated on the prior exam. Musculoskeletal: No chest wall abnormality. No acute or significant osseous findings. Review of the MIP images confirms the above findings. IMPRESSION: 1. No pulmonary emboli. 2. New atelectasis and  consolidation in the  posterior aspect of the right lower lobe. 3. Small hazy infiltrates in the superior aspect of the left lower lobe. 4. Chronic elevation of the right hemidiaphragm. 5. Enlarged main pulmonary arteries consistent with pulmonary arterial hypertension. Aortic Atherosclerosis (ICD10-I70.0). Electronically Signed   By: Francene BoyersJames  Maxwell M.D.   On: 11/17/2019 14:42   DG Chest Port 1 View  Result Date: 11/17/2019 CLINICAL DATA:  Shortness of breath. EXAM: PORTABLE CHEST 1 VIEW COMPARISON:  February 08, 2019. FINDINGS: The heart size and mediastinal contours are within normal limits. No pneumothorax or pleural effusion is noted. Left lung is clear. Minimal right basilar subsegmental atelectasis is noted. Stable elevated right hemidiaphragm. The visualized skeletal structures are unremarkable. IMPRESSION: Minimal right basilar subsegmental atelectasis. Electronically Signed   By: Lupita RaiderJames  Green Jr M.D.   On: 11/17/2019 11:49    ____________________________________________   PROCEDURES .Critical Care Performed by: Sharman CheekStafford, Caeson Filippi, MD Authorized by: Sharman CheekStafford, Lakeia Bradshaw, MD   Critical care provider statement:    Critical care time (minutes):  40   Critical care time was exclusive of:  Separately billable procedures and treating other patients   Critical care was necessary to treat or prevent imminent or life-threatening deterioration of the following conditions:  Respiratory failure and sepsis   Critical care was time spent personally by me on the following activities:  Development of treatment plan with patient or surrogate, discussions with consultants, evaluation of patient's response to treatment, examination of patient, obtaining history from patient or surrogate, ordering and performing treatments and interventions, ordering and review of laboratory studies, ordering and review of radiographic studies, pulse oximetry, re-evaluation of patient's condition and review of old  charts    ____________________________________________    CLINICAL IMPRESSION / ASSESSMENT AND PLAN / ED COURSE  Medications ordered in the ED: Medications  sodium chloride 0.9 % bolus 2,000 mL (has no administration in time range)  vancomycin (VANCOCIN) IVPB 1000 mg/200 mL premix (0 mg Intravenous Stopped 11/17/19 1310)  ceFEPIme (MAXIPIME) 2 g in sodium chloride 0.9 % 100 mL IVPB (0 g Intravenous Stopped 11/17/19 1224)  ipratropium-albuterol (DUONEB) 0.5-2.5 (3) MG/3ML nebulizer solution 3 mL (3 mLs Nebulization Given 11/17/19 1153)  methylPREDNISolone sodium succinate (SOLU-MEDROL) 125 mg/2 mL injection 125 mg (125 mg Intravenous Given 11/17/19 1142)  albuterol (PROVENTIL) (2.5 MG/3ML) 0.083% nebulizer solution 5 mg (5 mg Nebulization Given 11/17/19 1145)  sodium chloride 0.9 % bolus 1,000 mL (0 mLs Intravenous Stopped 11/17/19 1320)  iohexol (OMNIPAQUE) 350 MG/ML injection 75 mL (60 mLs Intravenous Contrast Given 11/17/19 1417)    Pertinent labs & imaging results that were available during my care of the patient were reviewed by me and considered in my medical decision making (see chart for details).  Makani R Mandarino was evaluated in Emergency Department on 11/17/2019 for the symptoms described in the history of present illness. He was evaluated in the context of the global COVID-19 pandemic, which necessitated consideration that the patient might be at risk for infection with the SARS-CoV-2 virus that causes COVID-19. Institutional protocols and algorithms that pertain to the evaluation of patients at risk for COVID-19 are in a state of rapid change based on information released by regulatory bodies including the CDC and federal and state organizations. These policies and algorithms were followed during the patient's care in the ED.     Clinical Course as of Nov 16 1528  Fri Nov 17, 2019  1121 Patient presents with respiratory distress, hypoxia or tachypnea tachycardia.  Differential includes  pneumonia with outpatient treatment failure/HCAP, COPD exacerbation, sepsis, COVID-19 pneumonitis, pulmonary edema, pleural effusion.  Doubt pneumothorax ACS dissection or carditis.  Possible PE.  Will start sepsis protocol, IV cefepime and vancomycin antibiotics, point-of-care Covid test, stat portable chest x-ray.  Patient placed on BiPAP on arrival with stabilization of oxygenation to 95%.   [PS]  1125 We will withhold IV fluids for now as it is not clear if the patient is volume depleted due to sepsis and pneumonia or possibly volume overloaded with pulmonary edema.  May need diuresis rather than IV fluids.  Due to clinical uncertainty at this time, will wait for chest x-ray and some lab work to further guide resuscitation.   [PS]  1139 Chest x-ray image viewed by me, no evidence of pulmonary edema/volume overload.  Will start IV fluids.  Initial clinical presentation does not show any evidence of shock, will follow up lactate.   [PS]  1141 Lactate only marginally elevated, large-volume fluid resuscitation not indicated.   Lactic Acid, Venous(!!): 2.0 [PS]  1209 Radiology report confirms unremarkable chest x-ray, not explaining the patient's respiratory distress and hypoxia presentation.  Will obtain CT angiogram of the chest to evaluate for PE.   [PS]  1508 CT scan of the chest negative for PE, but does show right lung posterior consolidation, consistent with HCAP.  Continue antibiotics.  Covid PCR negative.  Lactate uptrending from 2.0-3.7, will increase IV fluids.   [PS]    Clinical Course User Index [PS] Sharman Cheek, MD     ____________________________________________   FINAL CLINICAL IMPRESSION(S) / ED DIAGNOSES    Final diagnoses:  HCAP (healthcare-associated pneumonia)  Acute respiratory failure with hypoxia (HCC)  Sepsis, due to unspecified organism, unspecified whether acute organ dysfunction present [facility])  COPD exacerbation [facility])     ED Discharge Orders    None       Portions of this note were generated with dragon dictation software. Dictation errors may occur despite best attempts at proofreading.   Sharman Cheek, MD 11/17/19 1530

## 2019-11-17 NOTE — Progress Notes (Signed)
PHARMACY -  BRIEF ANTIBIOTIC NOTE   Pharmacy has received consult(s) for Vancomycin and Cefepime from an ED provider.  The patient's profile has been reviewed for ht/wt/allergies/indication/available labs.    One time order(s) placed by MD for Cefepime 2 gm and Vancomcyin 1000 mg  Further antibiotics/pharmacy consults should be ordered by admitting physician if indicated.                       Thank you, Nyan Dufresne A 11/17/2019  11:22 AM

## 2019-11-17 NOTE — Progress Notes (Signed)
Pharmacy Antibiotic Note  Gary Thomas is a 86 y.o. male admitted on 11/17/2019 with sepsis and pneumonia.  Pharmacy has been consulted for vancomycin and cefepime dosing.  Pt received cefepime 2g IV x1 and vancomycin 1 g IV x1 in the ED.  Plan: Cefepime 2g IV q12h  Vancomycin 1000 mg IV Q 36 hrs to start now as pt did not receive full load. Goal AUC 400-550. Expected AUC: 467.6 SCr used: 1.85 Expected Cmin: 11.5  Will need to continue to monitor renal function; BMP already ordered for AM. Will order MRSA PCR to help guide therapy.   Height: 5\' 9"  (175.3 cm) Weight: 219 lb 9.6 oz (99.6 kg) IBW/kg (Calculated) : 70.7  Temp (24hrs), Avg:97.9 F (36.6 C), Min:97.9 F (36.6 C), Max:97.9 F (36.6 C)  Recent Labs  Lab 11/17/19 1113 11/17/19 1119 11/17/19 1416 11/17/19 1643  WBC 4.5  --   --   --   CREATININE 1.85*  --   --   --   LATICACIDVEN  --  2.0* 3.7* 5.5*    Estimated Creatinine Clearance: 34 mL/min (A) (by C-G formula based on SCr of 1.85 mg/dL (H)).    Allergies  Allergen Reactions  . Codeine Anaphylaxis  . Morphine Anaphylaxis  . Gabapentin Other (See Comments)    Unsure of what the reaction was   . Stadol [Butorphanol]     Unknown  . Lyrica [Pregabalin] Rash  . Tapentadol Nausea Only    ONSET 01/04/2013  . Tramadol     Hallucinations "I see giant spiders on ceiling". Pt also believes he is in [facility], but he is in [facility].    Antimicrobials this admission: Vancomycin/cefepime 2/5 >>  Dose adjustments this admission:   Microbiology results: 2/5 BCx: collected 2/5 UCx: collected   2/5 MRSA PCR: ordered  Thank you for allowing pharmacy to be a part of this patient's care.  [place] 11/17/2019 5:59 PM

## 2019-11-17 NOTE — ED Notes (Signed)
Respiratory called for transport of pt to [facility].

## 2019-11-17 NOTE — H&P (Addendum)
History and Physical:    Gary Thomas   CBU:[id number] DOB: 1932/07/04 DOA: 11/17/2019  Referring MD/provider: Brenton Grills, MD PCP: Idelle Crouch, MD   Patient coming from: Home  Chief Complaint: Shortness of breath  History of Present Illness:   Gary Thomas is an 86 y.o. male with multiple medical problems including but not limited to stroke, pulmonary embolism, hypertension, stage IIIb CKD, depression, COPD, history of chronic hypoxemic respiratory failure requiring oxygen in the past but has not used oxygen for 4 to 5 years, diabetes mellitus, hyperlipidemia, femur fracture, presented to the hospital with increasing shortness of breath.  Patient said he has been having a cough and shortness of breath for almost a month now.  He saw his primary care physician and he was initially prescribed amoxicillin but he did not do him any good.  He was subsequently prescribed Levaquin which did not help either.  He said he went to see his primary care physician yesterday because of persistent cough and increasing shortness of breath.  He said on the way to the doctor's office he vomited several times in the car.  He was given a prescription of Augmentin and was told to go to the emergency room if he did not get any better.  Today, shortness of breath worsened so EMS was called.  Reportedly, his oxygen saturation was 81% on room air when EMS arrived so he was placed on BiPAP.    No chest pain, wheezing, fever or chills.  No diarrhea or abdominal pain.  ED Course:  The patient was tachypneic, tachycardic in the emergency room.  Oxygen saturation was 96% on BiPAP and was subsequently transitioned to 3 L/min oxygen via nasal cannula.  Initial lactic acid level was 2 but it went up to 3.7.  He was given IV Solu-Medrol, IV cefepime, IV vancomycin, 3 L of normal saline and nebulized albuterol and ipratropium  ROS:   ROS all other systems reviewed were negative  Past Medical History:    Past Medical History:  Diagnosis Date  . Depression   . Diabetes mellitus without complication ([place])   . Dyspnea   . Elevated lipids   . Femur fracture ([place]) 08/22/2018  . GERD (gastroesophageal reflux disease)   . Hypertension   . Pulmonary emboli ([place])   . Renal insufficiency    only has 1 kidney  . Sleep apnea   . Stroke ([place])    Rt side weakness    Past Surgical History:   Past Surgical History:  Procedure Laterality Date  . 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 AORTA SURGERY    . CAROTID ARTERY - SUBCLAVIAN ARTERY BYPASS GRAFT Bilateral   . CATARACT EXTRACTION W/ INTRAOCULAR LENS  IMPLANT, BILATERAL    . CHOLECYSTECTOMY    . EYE SURGERY    . FEMUR FRACTURE SURGERY Right   . HIP FRACTURE SURGERY Bilateral   . I & D EXTREMITY Right 10/18/2018   Procedure: IRRIGATION AND DEBRIDEMENT EXTREMITY, POSSIBLE HARDWARE REMOVAL OF SUPERCONDYLAR PLATE;  Surgeon: Corky Mull, MD;  Location: [facility] ORS;  Service: Orthopedics;  Laterality: Right;  . JOINT REPLACEMENT    . ORIF FEMUR FRACTURE Right 08/24/2018   Procedure: OPEN REDUCTION INTERNAL FIXATION (ORIF) SUPRACONDYLAR FEMUR FRACTURE;  Surgeon: Corky Mull, MD;  Location: [facility] ORS;  Service: Orthopedics;  Laterality: Right;  . ORIF FEMUR FRACTURE Right 10/18/2018   Procedure: HARDWARE REMOVAL- DISTAL FEMUR FRACTURE FIXATION;  Surgeon: Christena Flake, MD;  Location: [facility] ORS;  Service: Orthopedics;  Laterality: Right;  . PROSTATE SURGERY    . REPLACEMENT TOTAL KNEE Right   . TOTAL HIP ARTHROPLASTY Right 03/02/2017   Procedure: TOTAL HIP ARTHROPLASTY ANTERIOR APPROACH;  Surgeon: Kennedy Bucker, MD;  Location: [facility] ORS;  Service: Orthopedics;  Laterality: Right;  Marland Kitchen VASCULAR SURGERY      Social History:   Social History   Socioeconomic History  . Marital status: Married    Spouse name: Not on file  . Number of children: Not on file  . Years of education: Not on file  . Highest education  level: Not on file  Occupational History  . Not on file  Tobacco Use  . Smoking status: Former Smoker    Packs/day: 1.00    Years: 7.00    Pack years: 7.00    Types: Cigarettes    Quit date: 02/18/1952    Years since quitting: 67.7  . Smokeless tobacco: Former Engineer, water and Sexual Activity  . Alcohol use: No  . Drug use: No  . Sexual activity: Not Currently  Other Topics Concern  . Not on file  Social History Narrative  . Not on file   Social Determinants of Health   Financial Resource Strain:   . Difficulty of Paying Living Expenses: Not on file  Food Insecurity:   . Worried About Programme researcher, broadcasting/film/video in the Last Year: Not on file  . Ran Out of Food in the Last Year: Not on file  Transportation Needs:   . Lack of Transportation (Medical): Not on file  . Lack of Transportation (Non-Medical): Not on file  Physical Activity:   . Days of Exercise per Week: Not on file  . Minutes of Exercise per Session: Not on file  Stress:   . Feeling of Stress : Not on file  Social Connections:   . Frequency of Communication with Friends and Family: Not on file  . Frequency of Social Gatherings with Friends and Family: Not on file  . Attends Religious Services: Not on file  . Active Member of Clubs or Organizations: Not on file  . Attends Banker Meetings: Not on file  . Marital Status: Not on file  Intimate Partner Violence:   . Fear of Current or Ex-Partner: Not on file  . Emotionally Abused: Not on file  . Physically Abused: Not on file  . Sexually Abused: Not on file    Allergies   Codeine, Morphine, Gabapentin, Stadol [butorphanol], Lyrica [pregabalin], Tapentadol, and Tramadol  Family history:   Family History  Problem Relation Age of Onset  . Heart attack Father   . Stroke Father   . Heart disease Father   . Heart attack Mother     Current Medications:   Prior to Admission medications   Medication Sig Start Date End Date Taking? Authorizing  Provider  acetaminophen (TYLENOL) 325 MG tablet Take 2 tablets (650 mg total) by mouth every 6 (six) hours as needed for mild pain (or Fever >/= 101). 08/30/18   Gouru, Deanna Artis, MD  albuterol (PROVENTIL) (2.5 MG/3ML) 0.083% nebulizer solution Take 3 mLs (2.5 mg total) by nebulization every 4 (four) hours as needed for wheezing or shortness of breath. 09/14/18   Sharee Holster, NP  allopurinol (ZYLOPRIM) 100 MG tablet Take 0.5 tablets (50 mg total) by mouth every other day. 09/14/18   Sharee Holster, NP  atorvastatin (LIPITOR) 20 MG tablet Take 1 tablet (  20 mg total) by mouth at bedtime. 09/14/18   Sharee Holster, NP  budesonide (PULMICORT) 0.5 MG/2ML nebulizer solution Take 2 mLs (0.5 mg total) by nebulization 2 (two) times daily. 09/14/18   Sharee Holster, NP  Cholecalciferol 100 MCG (4000 UT) CAPS Take 1 capsule by mouth daily. 08/31/18   [provider]  furosemide (LASIX) 40 MG tablet Take 1 tablet (40 mg total) by mouth at bedtime. 09/14/18   Sharee Holster, NP  Infant Care Products [facility] EX) Apply liberal amount topically as needed to area of skin irritation.  Ok to leave at bedside 09/02/18   [provider]  insulin aspart (NOVOLOG) 100 UNIT/ML injection Inject 0-5 Units into the skin at bedtime. Correction coverage: HS scale CBG<70: Implement hypoglycemia protocol. CBG 70-120: 0 Units, CBG 121-150: 0 Units, CBG 151-200: 0 Units, CBG 201-250: 2 Units, CBG 251-300: 3 Units, CBG 301-350: 4 Units, CBG 351-400: 5 Units, CBG >400: Call MD and obtain STAT lab verification.    [provider]  ipratropium-albuterol (DUONEB) 0.5-2.5 (3) MG/3ML SOLN Take 3 mLs by nebulization 2 (two) times daily. 09/14/18   Sharee Holster, NP  loperamide (IMODIUM A-D) 2 MG tablet Take 2 mg by mouth 4 (four) times daily as needed for diarrhea or loose stools. 09/07/18   [provider]  metoprolol succinate (TOPROL-XL) 100 MG 24 hr tablet Take 1 tablet (100 mg total) by mouth  at bedtime. 09/14/18   Sharee Holster, NP  mirtazapine (REMERON) 15 MG tablet Take 15 mg by mouth at bedtime.    [provider]  multivitamin-lutein (OCUVITE-LUTEIN) CAPS capsule Take 1 capsule by mouth daily.    [provider]  NON FORMULARY Diet Type: NAS. NCS    [provider]  NOVOLOG MIX 70/30 FLEXPEN (70-30) 100 UNIT/ML FlexPen Inject 0.25 mLs (25 Units total) into the skin 2 (two) times daily with a meal. 02/10/19   Altamese Dilling, MD  nystatin (MYCOSTATIN/NYSTOP) powder Apply topically as directed. Apply with each ostomy change as instructed    [provider]  omeprazole (PRILOSEC) 40 MG capsule Take 1 capsule (40 mg total) by mouth daily. 09/14/18   Sharee Holster, NP  sertraline (ZOLOFT) 100 MG tablet Take 1 tablet (100 mg total) by mouth at bedtime. 09/14/18   Sharee Holster, NP  tiotropium (SPIRIVA) 18 MCG inhalation capsule Place 18 mcg into inhaler and inhale daily. 08/30/18   [provider]    Physical Exam:   Vitals:   11/17/19 1400 11/17/19 1405 11/17/19 1430 11/17/19 1530  BP: 130/67  (!) 134/56 125/63  Pulse: (!) 111  (!) 116 (!) 109  Resp: 12  (!) 26 19  Temp:      TempSrc:      SpO2: 95% 95% 98% 97%  Weight:      Height:         Physical Exam: Blood pressure 125/63, pulse (!) 109, temperature 97.9 F (36.6 C), temperature source Axillary, resp. rate 19, height 5\' 9"  (1.753 m), weight 97.1 kg, SpO2 97 %. Gen: No acute distress. Head: Normocephalic, atraumatic. Eyes: Pupils equal, round and reactive to light. Extraocular movements intact.  Sclerae nonicteric.  Mouth: Moist mucous membranes Neck: Supple, no thyromegaly, no lymphadenopathy, no jugular venous distention. Chest: Air entry adequate bilaterally.  No wheezing or rhonchi heard.  Bibasilar rales noted but more prominent on the right CV: Heart sounds are regular with an S1, S2. No murmurs, rubs or gallops.  Abdomen: Soft, nontender, nondistended  with normal active bowel sounds. No palpable masses.  + colostomy Extremities: Extremities are without clubbing, or cyanosis. No edema. Pedal pulses 2+.  Skin: Warm and dry. No wounds Neuro: Alert and oriented times 3; grossly nonfocal.  Psych: Insight is good and judgment is appropriate. Mood and affect normal.   Data Review:    Labs: Basic Metabolic Panel: Recent Labs  Lab 11/17/19 1113  NA 140  K 5.0  CL 106  CO2 24  GLUCOSE 276*  BUN 55*  CREATININE 1.85*  CALCIUM 9.1   Liver Function Tests: Recent Labs  Lab 11/17/19 1113  AST 26  ALT 17  ALKPHOS 74  BILITOT 0.5  PROT 7.2  ALBUMIN 3.7   Recent Labs  Lab 11/17/19 1113  LIPASE 33   No results for input(s): AMMONIA in the last 168 hours. CBC: Recent Labs  Lab 11/17/19 1113  WBC 4.5  NEUTROABS 3.9  HGB 14.5  HCT 45.5  MCV 91.9  PLT 117*   Cardiac Enzymes: No results for input(s): CKTOTAL, CKMB, CKMBINDEX, TROPONINI in the last 168 hours.  BNP (last 3 results) No results for input(s): PROBNP in the last 8760 hours. CBG: No results for input(s): GLUCAP in the last 168 hours.  Urinalysis    Component Value Date/Time   COLORURINE YELLOW (A) 11/17/2019 1337   APPEARANCEUR CLOUDY (A) 11/17/2019 1337   LABSPEC 1.017 11/17/2019 1337   PHURINE 8.0 11/17/2019 1337   GLUCOSEU NEGATIVE 11/17/2019 1337   HGBUR NEGATIVE 11/17/2019 1337   BILIRUBINUR NEGATIVE 11/17/2019 1337   KETONESUR NEGATIVE 11/17/2019 1337   PROTEINUR 100 (A) 11/17/2019 1337   NITRITE NEGATIVE 11/17/2019 1337   LEUKOCYTESUR TRACE (A) 11/17/2019 1337      Radiographic Studies: CT Angio Chest PE W and/or Wo Contrast  Result Date: 11/17/2019 CLINICAL DATA:  Increasing shortness of breath. Hypoxemia. EXAM: CT ANGIOGRAPHY CHEST WITH CONTRAST TECHNIQUE: Multidetector CT imaging of the chest was performed using the standard protocol during bolus administration of intravenous contrast. Multiplanar CT image reconstructions and MIPs were  obtained to evaluate the vascular anatomy. CONTRAST:  75mL OMNIPAQUE IOHEXOL 350 MG/ML SOLN COMPARISON:  Chest x-ray dated 11/17/2019 and chest CT dated 11/20/2008 FINDINGS: Cardiovascular: There are no pulmonary emboli. Aortic atherosclerosis. Enlarged main pulmonary arteries. Overall heart size is normal. No pericardial effusion. Mediastinum/Nodes: Chronic bilateral thyroid nodules, unchanged since 2010. No significant abnormality of the esophagus trachea. No hilar or mediastinal adenopathy. Lungs/Pleura: There small hazy infiltrates in the superior aspect of the left lower lobe. There is new atelectasis and consolidation in the posterior aspect of the right lower lobe. Increased chronic elevation of the right hemidiaphragm contributes to the atelectasis. No effusions. Upper Abdomen: No acute abnormality. Multiple hepatic cysts as demonstrated on the prior exam. Musculoskeletal: No chest wall abnormality. No acute or significant osseous findings. Review of the MIP images confirms the above findings. IMPRESSION: 1. No pulmonary emboli. 2. New atelectasis and consolidation in the posterior aspect of the right lower lobe. 3. Small hazy infiltrates in the superior aspect of the left lower lobe. 4. Chronic elevation of the right hemidiaphragm. 5. Enlarged main pulmonary arteries consistent with pulmonary arterial hypertension. Aortic Atherosclerosis (ICD10-I70.0). Electronically Signed   By: Francene Boyers M.D.   On: 11/17/2019 14:42   DG Chest Port 1 View  Result Date: 11/17/2019 CLINICAL DATA:  Shortness of breath. EXAM: PORTABLE CHEST 1 VIEW COMPARISON:  February 08, 2019. FINDINGS: The heart size and mediastinal contours are within normal  limits. No pneumothorax or pleural effusion is noted. Left lung is clear. Minimal right basilar subsegmental atelectasis is noted. Stable elevated right hemidiaphragm. The visualized skeletal structures are unremarkable. IMPRESSION: Minimal right basilar subsegmental atelectasis.  Electronically Signed   By: Lupita Raider M.D.   On: 11/17/2019 11:49    EKG: Independently reviewed.  Sinus tachycardia   Assessment/Plan:   Principal Problem:   Sepsis (HCC) Active Problems:   Community acquired pneumonia   Acute hypoxemic respiratory failure (HCC)   Sepsis secondary to pneumonia: Admit to telemetry.  He failed outpatient antibiotics.  He has already received 3 L of normal saline.  Treat with empiric vancomycin and cefepime.  Follow-up blood cultures.  Keep n.p.o. for now.  Consult speech therapist for swallow evaluation.  Acute hypoxemic respiratory failure: Patient has been weaned off of BiPAP and is tolerating oxygen via nasal cannula, 3 L/min.  Insulin-dependent diabetes mellitus: Hold NovoLog Mix for now since patient will be n.p.o.  Use NovoLog as needed for hyperglycemia  COPD: Compensated.  Continue bronchodilators.  CKD stage IIIb: Creatinine appears to be at baseline.  Monitor BMP.  Body mass index is 31.61 kg/m.  (Obesity)  Other information:   DVT prophylaxis: Lovenox Code Status: Full code Family Communication: Plan discussed with patient Disposition Plan: Possible discharge to home in 3 to 4 days Consults called: None Admission status: Inpatient  The medical decision making on this patient was of high complexity and the patient is at high risk for clinical deterioration, therefore this is a level 3 visit.   Time spent 60 minutes  Anika Shore [facility]   How to contact the [facility] Attending or Consulting provider 7A - 7P or covering provider during after hours 7P -7A, for this patient?   1. Check the care team in [facility] and look for a) attending/consulting [facility] provider listed and b) the [facility] team listed 2. Log into [email or web address] and use [place]'s universal password to access. If you do not have the password, please contact the hospital operator. 3. Locate the [facility] provider you are looking for under [facility] and page  to a number that you can be directly reached. 4. If you still have difficulty reaching the provider, please page the [facility] (Director on Call) for the Hospitalists listed on amion for assistance.  11/17/2019, 4:48 PM

## 2019-11-18 LAB — CBC
HCT: 40.4 % (ref 39.0–52.0)
Hemoglobin: 12.8 g/dL — ABNORMAL LOW (ref 13.0–17.0)
MCH: 29.4 pg (ref 26.0–34.0)
MCHC: 31.7 g/dL (ref 30.0–36.0)
MCV: 92.7 fL (ref 80.0–100.0)
Platelets: 102 10*3/uL — ABNORMAL LOW (ref 150–400)
RBC: 4.36 MIL/uL (ref 4.22–5.81)
RDW: 13.8 % (ref 11.5–15.5)
WBC: 8.7 10*3/uL (ref 4.0–10.5)
nRBC: 0 % (ref 0.0–0.2)

## 2019-11-18 LAB — BASIC METABOLIC PANEL
Anion gap: 6 (ref 5–15)
BUN: 41 mg/dL — ABNORMAL HIGH (ref 8–23)
CO2: 23 mmol/L (ref 22–32)
Calcium: 8.5 mg/dL — ABNORMAL LOW (ref 8.9–10.3)
Chloride: 111 mmol/L (ref 98–111)
Creatinine, Ser: 1.61 mg/dL — ABNORMAL HIGH (ref 0.61–1.24)
GFR calc Af Amer: 45 mL/min — ABNORMAL LOW (ref >60)
GFR calc non Af Amer: 38 mL/min — ABNORMAL LOW (ref >60)
Glucose, Bld: 130 mg/dL — ABNORMAL HIGH (ref 70–99)
Potassium: 4.9 mmol/L (ref 3.5–5.1)
Sodium: 140 mmol/L (ref 135–145)

## 2019-11-18 LAB — GLUCOSE, CAPILLARY
Glucose-Capillary: 102 mg/dL — ABNORMAL HIGH (ref 70–99)
Glucose-Capillary: 105 mg/dL — ABNORMAL HIGH (ref 70–99)
Glucose-Capillary: 113 mg/dL — ABNORMAL HIGH (ref 70–99)
Glucose-Capillary: 114 mg/dL — ABNORMAL HIGH (ref 70–99)
Glucose-Capillary: 97 mg/dL (ref 70–99)

## 2019-11-18 LAB — URINE CULTURE: Culture: NO GROWTH

## 2019-11-18 LAB — MRSA PCR SCREENING: MRSA by PCR: POSITIVE — AB

## 2019-11-18 MED ORDER — SODIUM CHLORIDE 0.9 % IV SOLN
INTRAVENOUS | Status: DC | PRN
  Administered 2019-11-18: 09:00:00 250 mL via INTRAVENOUS
  Administered 2019-11-20: 50 mL via INTRAVENOUS

## 2019-11-18 MED ORDER — VANCOMYCIN HCL 750 MG/150ML IV SOLN
750.0000 mg | INTRAVENOUS | Status: DC
  Administered 2019-11-18 – 2019-11-19 (×2): 750 mg via INTRAVENOUS
  Filled 2019-11-18 (×3): qty 150

## 2019-11-18 NOTE — Progress Notes (Signed)
Progress Note    Gary Thomas  UUV:[id number] DOB: 1932-03-19  DOA: 11/17/2019 PCP: Marguarite Arbour, MD      Brief Narrative:    Medical records reviewed and are as summarized below:  Gary Thomas is an 86 y.o. male Gary Thomas is an 86 y.o. male with multiple medical problems including but not limited to stroke, pulmonary embolism, hypertension, stage IIIb CKD, depression, COPD, history of chronic hypoxemic respiratory failure requiring oxygen in the past but has not used oxygen for 4 to 5 years, diabetes mellitus, hyperlipidemia, femur fracture, presented to the hospital with increasing shortness of breath.  Patient said he has been having a cough and shortness of breath for almost a month now.  He saw his primary care physician and he was initially prescribed amoxicillin but he did not do him any good.  He was subsequently prescribed Levaquin which did not help either.  He said he went to see his primary care physician yesterday because of persistent cough and increasing shortness of breath.  He said on the way to the doctor's office he vomited several times in the car.  He was given a prescription of Augmentin and was told to go to the emergency room if he did not get any better.  Today, shortness of breath worsened so EMS was called.  Reportedly, his oxygen saturation was 81% on room air when EMS arrived so he was placed on BiPAP.    No chest pain, wheezing, fever or chills.  No diarrhea or abdominal pain.  ED Course:  The patient was tachypneic, tachycardic in the emergency room.  Oxygen saturation was 96% on BiPAP and was subsequently transitioned to 3 L/min oxygen via nasal cannula.  Initial lactic acid level was 2 but it went up to 3.7.  He was given IV Solu-Medrol, IV cefepime, IV vancomycin, 3 L of normal saline and nebulized albuterol and ipratropium      Assessment/Plan:   Principal Problem:   Sepsis (HCC) Active Problems:   Community acquired  pneumonia   Acute hypoxemic respiratory failure (HCC)  Sepsis secondary to pneumonia:  He failed outpatient antibiotics. Continue empiric vancomycin and cefepime.  Follow-up blood cultures.  He was evaluated by the speech therapist and also no evidence of oropharyngeal dysphagia and regular diet was recommended.  Acute hypoxemic respiratory failure: He is down to 2 L/min oxygen via nasal cannula.  Taper off as able.  Insulin-dependent diabetes mellitus: Hold NovoLog Mix for now. Use NovoLog as needed for hyperglycemia  COPD: Compensated.  Continue bronchodilators.  CKD stage IIIb: Creatinine appears to be at baseline.  Monitor BMP.     Body mass index is 32.43 kg/m.  (Obesity)   Family Communication/Anticipated D/C date and plan/Code Status   DVT prophylaxis: Lovenox Code Status: Full code Family Communication: Plan discussed with the patient and his wife at the bedside Disposition Plan: Possible discharge to home in 2 to 3 days      Subjective:   No complaints.  He feels better today.  No shortness of breath or chest pain.  Objective:    Vitals:   11/18/19 0745 11/18/19 0751 11/18/19 0912 11/18/19 1215  BP: (!) 156/77   (!) 127/55  Pulse: 64   67  Resp: 16   16  Temp: 97.6 F (36.4 C)   97.7 F (36.5 C)  TempSrc: Oral   Oral  SpO2: 100% 99% 97% 98%  Weight:      Height:  Intake/Output Summary (Last 24 hours) at 11/18/2019 1553 Last data filed at 11/18/2019 1054 Gross per 24 hour  Intake 415.22 ml  Output 400 ml  Net 15.22 ml   Filed Weights   11/17/19 1257 11/17/19 1733  Weight: 97.1 kg 99.6 kg    Exam:  GEN: NAD SKIN: Warm and dry, no ulcers EYES: EOMI ENT: MMM CV: RRR PULM: Air entry adequate bilaterally, no wheezing heard.  He has right basilar rales ABD: soft, ND, NT, +BS, + colostomy CNS: AAO x 3, non focal EXT: No edema or tenderness   Data Reviewed:   I have personally reviewed following labs and imaging  studies:  Labs: Labs show the following:   Basic Metabolic Panel: Recent Labs  Lab 11/17/19 1113 11/18/19 0531  NA 140 140  K 5.0 4.9  CL 106 111  CO2 24 23  GLUCOSE 276* 130*  BUN 55* 41*  CREATININE 1.85* 1.61*  CALCIUM 9.1 8.5*   GFR Estimated Creatinine Clearance: 39 mL/min (A) (by C-G formula based on SCr of 1.61 mg/dL (H)). Liver Function Tests: Recent Labs  Lab 11/17/19 1113  AST 26  ALT 17  ALKPHOS 74  BILITOT 0.5  PROT 7.2  ALBUMIN 3.7   Recent Labs  Lab 11/17/19 1113  LIPASE 33   No results for input(s): AMMONIA in the last 168 hours. Coagulation profile Recent Labs  Lab 11/17/19 1113  INR 1.0    CBC: Recent Labs  Lab 11/17/19 1113 11/18/19 0531  WBC 4.5 8.7  NEUTROABS 3.9  --   HGB 14.5 12.8*  HCT 45.5 40.4  MCV 91.9 92.7  PLT 117* 102*   Cardiac Enzymes: No results for input(s): CKTOTAL, CKMB, CKMBINDEX, TROPONINI in the last 168 hours. BNP (last 3 results) No results for input(s): PROBNP in the last 8760 hours. CBG: Recent Labs  Lab 11/17/19 1746 11/17/19 2355 11/18/19 0556 11/18/19 0821 11/18/19 1130  GLUCAP 276* 161* 113* 97 105*   D-Dimer: No results for input(s): DDIMER in the last 72 hours. Hgb A1c: No results for input(s): HGBA1C in the last 72 hours. Lipid Profile: No results for input(s): CHOL, HDL, LDLCALC, TRIG, CHOLHDL, LDLDIRECT in the last 72 hours. Thyroid function studies: No results for input(s): TSH, T4TOTAL, T3FREE, THYROIDAB in the last 72 hours.  Invalid input(s): FREET3 Anemia work up: No results for input(s): VITAMINB12, FOLATE, FERRITIN, TIBC, IRON, RETICCTPCT in the last 72 hours. Sepsis Labs: Recent Labs  Lab 11/17/19 1113 11/17/19 1119 11/17/19 1416 11/17/19 1643 11/17/19 1807 11/18/19 0531  PROCALCITON 0.11  --   --   --   --   --   WBC 4.5  --   --   --   --  8.7  LATICACIDVEN  --  2.0* 3.7* 5.5* 4.3*  --     Microbiology Recent Results (from the past 240 hour(s))  MRSA PCR  Screening     Status: Abnormal   Collection Time: 11/17/19  9:24 AM   Specimen: Nasopharyngeal  Result Value Ref Range Status   MRSA by PCR POSITIVE (A) NEGATIVE Final    Comment:        The GeneXpert MRSA Assay (FDA approved for NASAL specimens only), is one component of a comprehensive MRSA colonization surveillance program. It is not intended to diagnose MRSA infection nor to guide or monitor treatment for MRSA infections. RESULT CALLED TO, READ BACK BY AND VERIFIED WITH: BERENICE ALEJO AT 1117 ON 11/18/2019 [facility]. Performed at [facility], [address] [place], [place] [address]   Blood Culture (routine x 2)     Status: None (Preliminary result)   Collection Time: 11/17/19 11:37 AM   Specimen: BLOOD  Result Value Ref Range Status   Specimen Description BLOOD RIGHT WRIST  Final   Special Requests NONE  Final   Culture   Final    NO GROWTH < 24 HOURS Performed at [facility], [address] [place], [place] [address]    Report Status PENDING  Incomplete  Blood Culture (routine x 2)     Status: None (Preliminary result)   Collection Time: 11/17/19 11:37 AM   Specimen: BLOOD  Result Value Ref Range Status   Specimen Description BLOOD RIGHT HAND  Final   Special Requests   Final    BOTTLES DRAWN AEROBIC AND ANAEROBIC Blood Culture results may not be optimal due to an excessive volume of blood received in culture bottles   Culture   Final    NO GROWTH < 24 HOURS Performed at [facility], [address] [place], [place] [address]    Report Status PENDING  Incomplete  Respiratory Panel by RT PCR (Flu A&B, Covid) - Nasopharyngeal Swab     Status: None   Collection Time: 11/17/19 12:26 PM   Specimen: Nasopharyngeal Swab  Result Value Ref Range Status   SARS Coronavirus 2 by RT PCR NEGATIVE NEGATIVE Final    Comment: (NOTE) SARS-CoV-2 target nucleic acids are NOT DETECTED. The SARS-CoV-2 RNA is generally detectable in upper  respiratoy specimens during the acute phase of infection. The lowest concentration of SARS-CoV-2 viral copies this assay can detect is 131 copies/mL. A negative result does not preclude SARS-Cov-2 infection and should not be used as the sole basis for treatment or other patient management decisions. A negative result may occur with  improper specimen collection/handling, submission of specimen other than nasopharyngeal swab, presence of viral mutation(s) within the areas targeted by this assay, and inadequate number of viral copies (<131 copies/mL). A negative result must be combined with clinical observations, patient history, and epidemiological information. The expected result is Negative. Fact Sheet for Patients:  https://www.moore.com/ Fact Sheet for Healthcare Providers:  https://www.young.biz/ This test is not yet ap proved or cleared by the [place] FDA and  has been authorized for detection and/or diagnosis of SARS-CoV-2 by FDA under an Emergency Use Authorization (EUA). This EUA will remain  in effect (meaning this test can be used) for the duration of the COVID-19 declaration under Section 564(b)(1) of the Act, 21 U.S.C. section 360bbb-3(b)(1), unless the authorization is terminated or revoked sooner.    Influenza A by PCR NEGATIVE NEGATIVE Final   Influenza B by PCR NEGATIVE NEGATIVE Final    Comment: (NOTE) The Xpert Xpress SARS-CoV-2/FLU/RSV assay is intended as an aid in  the diagnosis of influenza from Nasopharyngeal swab specimens and  should not be used as a sole basis for treatment. Nasal washings and  aspirates are unacceptable for Xpert Xpress SARS-CoV-2/FLU/RSV  testing. Fact Sheet for Patients: https://www.moore.com/ Fact Sheet for Healthcare Providers: https://www.young.biz/ This test is not yet approved or cleared by the [place] FDA and  has been authorized for  detection and/or diagnosis of SARS-CoV-2 by  FDA under an Emergency Use Authorization (EUA). This EUA will remain  in effect (meaning this test can be used) for the duration of the  Covid-19 declaration under Section 564(b)(1) of the Act, 21  U.S.C. section 360bbb-3(b)(1), unless the authorization is  terminated or revoked.  Performed at [facility], [address], [place], [place] [address]   Urine culture     Status: None   Collection Time: 11/17/19  1:37 PM   Specimen: In/Out Cath Urine  Result Value Ref Range Status   Specimen Description   Final    IN/OUT CATH URINE Performed at [facility], [address] [place], [place] [address]    Special Requests   Final    NONE Performed at [facility], [address], [place], [place] [address]    Culture   Final    NO GROWTH Performed at [facility], [place] [address], [place], [place] [address]    Report Status 11/18/2019 FINAL  Final    Procedures and diagnostic studies:  CT Angio Chest PE W and/or Wo Contrast  Result Date: 11/17/2019 CLINICAL DATA:  Increasing shortness of breath. Hypoxemia. EXAM: CT ANGIOGRAPHY CHEST WITH CONTRAST TECHNIQUE: Multidetector CT imaging of the chest was performed using the standard protocol during bolus administration of intravenous contrast. Multiplanar CT image reconstructions and MIPs were obtained to evaluate the vascular anatomy. CONTRAST:  37mL OMNIPAQUE IOHEXOL 350 MG/ML SOLN COMPARISON:  Chest x-ray dated 11/17/2019 and chest CT dated 11/20/2008 FINDINGS: Cardiovascular: There are no pulmonary emboli. Aortic atherosclerosis. Enlarged main pulmonary arteries. Overall heart size is normal. No pericardial effusion. Mediastinum/Nodes: Chronic bilateral thyroid nodules, unchanged since 2010. No significant abnormality of the esophagus trachea. No hilar or mediastinal adenopathy. Lungs/Pleura: There small hazy infiltrates in the superior aspect of the left lower  lobe. There is new atelectasis and consolidation in the posterior aspect of the right lower lobe. Increased chronic elevation of the right hemidiaphragm contributes to the atelectasis. No effusions. Upper Abdomen: No acute abnormality. Multiple hepatic cysts as demonstrated on the prior exam. Musculoskeletal: No chest wall abnormality. No acute or significant osseous findings. Review of the MIP images confirms the above findings. IMPRESSION: 1. No pulmonary emboli. 2. New atelectasis and consolidation in the posterior aspect of the right lower lobe. 3. Small hazy infiltrates in the superior aspect of the left lower lobe. 4. Chronic elevation of the right hemidiaphragm. 5. Enlarged main pulmonary arteries consistent with pulmonary arterial hypertension. Aortic Atherosclerosis (ICD10-I70.0). Electronically Signed   By: Lorriane Shire M.D.   On: 11/17/2019 14:42   DG Chest Port 1 View  Result Date: 11/17/2019 CLINICAL DATA:  Shortness of breath. EXAM: PORTABLE CHEST 1 VIEW COMPARISON:  February 08, 2019. FINDINGS: The heart size and mediastinal contours are within normal limits. No pneumothorax or pleural effusion is noted. Left lung is clear. Minimal right basilar subsegmental atelectasis is noted. Stable elevated right hemidiaphragm. The visualized skeletal structures are unremarkable. IMPRESSION: Minimal right basilar subsegmental atelectasis. Electronically Signed   By: Marijo Conception M.D.   On: 11/17/2019 11:49    Medications:   . allopurinol  50 mg Oral Q48H  . atorvastatin  20 mg Oral QHS  . budesonide  0.5 mg Nebulization BID  . enoxaparin (LOVENOX) injection  40 mg Subcutaneous Q24H  . insulin aspart  0-15 Units Subcutaneous TID WC  . metoprolol succinate  100 mg Oral QHS  . mirtazapine  15 mg Oral QHS  . pantoprazole  40 mg Oral Daily  . sertraline  100 mg Oral QHS  . tiotropium  18 mcg Inhalation Daily   Continuous Infusions: . sodium chloride Stopped (11/18/19 0919)  . ceFEPime (MAXIPIME)  IV 2 g (11/18/19 0919)  . vancomycin  LOS: 1 day   Ady Heimann  [facility]     11/18/2019, 3:53 PM

## 2019-11-18 NOTE — Progress Notes (Addendum)
Pharmacy Antibiotic Note  Gary Thomas is a 86 y.o. male admitted on 11/17/2019 with sepsis and pneumonia.  Pharmacy has been consulted for vancomycin and cefepime dosing.  Pt received cefepime 2g IV x1 and vancomycin 1 g IV x1 in the ED.  Plan: Continue Cefepime 2g IV q12h  Pt received a total load of 2000mg  IV vancomycin over ~ 8 hours to start (1000mg  x 2)  Vancomycin 750 mg IV Q 24 hrs. Goal AUC 400-550. Expected AUC: 467 SCr used: 1.61 (improved from 1.85 @ admission)  Will need to continue to monitor renal function daily  MRSA Postive 2/6   Height: 5\' 9"  (175.3 cm) Weight: 219 lb 9.6 oz (99.6 kg) IBW/kg (Calculated) : 70.7  Temp (24hrs), Avg:97.8 F (36.6 C), Min:97.5 F (36.4 C), Max:98.3 F (36.8 C)  Recent Labs  Lab 11/17/19 1113 11/17/19 1119 11/17/19 1416 11/17/19 1643 11/17/19 1807 11/18/19 0531  WBC 4.5  --   --   --   --  8.7  CREATININE 1.85*  --   --   --   --  1.61*  LATICACIDVEN  --  2.0* 3.7* 5.5* 4.3*  --     Estimated Creatinine Clearance: 39 mL/min (A) (by C-G formula based on SCr of 1.61 mg/dL (H)).    Allergies  Allergen Reactions  . Codeine Anaphylaxis  . Morphine Anaphylaxis  . Gabapentin Other (See Comments)    Unsure of what the reaction was   . Stadol [Butorphanol]     Unknown  . Lyrica [Pregabalin] Rash  . Tapentadol Nausea Only    ONSET 01/04/2013  . Tramadol     Hallucinations "I see giant spiders on ceiling". Pt also believes he is in [facility], but he is in 01/16/20.    Antimicrobials this admission: Vancomycin/cefepime 2/5 >>  Dose adjustments this admission: 2/6 Adjusted vancomycin dosing from 1000mg  q36h to 750mg h q 24  Microbiology results: 2/5 BCx: collected 2/5 UCx: collected   2/5 MRSA PCR: positive  Thank you for allowing pharmacy to be a part of this patient's care.  01/06/2013, PharmD, BCPS Clinical Pharmacist 11/18/2019 8:23 AM

## 2019-11-18 NOTE — Evaluation (Addendum)
Clinical/Bedside Swallow Evaluation Patient Details  Name: Gary Thomas MRN: [id number] Date of Birth: 12-Jul-1932  Today's Date: 11/18/2019 Time: SLP Start Time (ACUTE ONLY): 0945 SLP Stop Time (ACUTE ONLY): 1035 SLP Time Calculation (min) (ACUTE ONLY): 50 min  Past Medical History:  Past Medical History:  Diagnosis Date  . Depression   . Diabetes mellitus without complication ([place])   . Dyspnea   . Elevated lipids   . Femur fracture ([place]) 08/22/2018  . GERD (gastroesophageal reflux disease)   . Hypertension   . Pulmonary emboli ([place])   . Renal insufficiency    only has 1 kidney  . Sleep apnea   . Stroke ([place])    Rt side weakness   Past Surgical History:  Past Surgical History:  Procedure Laterality Date  . 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 AORTA SURGERY    . CAROTID ARTERY - SUBCLAVIAN ARTERY BYPASS GRAFT Bilateral   . CATARACT EXTRACTION W/ INTRAOCULAR LENS  IMPLANT, BILATERAL    . CHOLECYSTECTOMY    . EYE SURGERY    . FEMUR FRACTURE SURGERY Right   . HIP FRACTURE SURGERY Bilateral   . I & D EXTREMITY Right 10/18/2018   Procedure: IRRIGATION AND DEBRIDEMENT EXTREMITY, POSSIBLE HARDWARE REMOVAL OF SUPERCONDYLAR PLATE;  Surgeon: Corky Mull, MD;  Location: [facility] ORS;  Service: Orthopedics;  Laterality: Right;  . JOINT REPLACEMENT    . ORIF FEMUR FRACTURE Right 08/24/2018   Procedure: OPEN REDUCTION INTERNAL FIXATION (ORIF) SUPRACONDYLAR FEMUR FRACTURE;  Surgeon: Corky Mull, MD;  Location: [facility] ORS;  Service: Orthopedics;  Laterality: Right;  . ORIF FEMUR FRACTURE Right 10/18/2018   Procedure: HARDWARE REMOVAL- DISTAL FEMUR FRACTURE FIXATION;  Surgeon: Corky Mull, MD;  Location: [facility] ORS;  Service: Orthopedics;  Laterality: Right;  . PROSTATE SURGERY    . REPLACEMENT TOTAL KNEE Right   . TOTAL HIP ARTHROPLASTY Right 03/02/2017   Procedure: TOTAL HIP ARTHROPLASTY ANTERIOR APPROACH;  Surgeon: [name], MD;  Location: [facility]  ORS;  Service: Orthopedics;  Laterality: Right;  Marland Kitchen VASCULAR SURGERY     HPI:  Pt is an 86 y.o. male with multiple medical problems including but not limited to GERD, Obesity, pulmonary embolism, hypertension, stage IIIb CKD, depression, COPD, history of chronic hypoxemic respiratory failure requiring oxygen, stroke, diabetes mellitus, hyperlipidemia, femur fracture, presented to the hospital with increasing shortness of breath.  Patient said he has been having a cough and shortness of breath for almost a month now.  He was treated by his PCP w/ little success then returned to his PCP yesterday because of persistent cough and increasing shortness of breath.  He said on the way to the doctor's office he vomited several times in the car.  He was given a prescription of Augmentin and was told to go to the emergency room if he did not get any better.  IN the ED, patient was tachypneic, tachycardic in the emergency room.  Oxygen saturation was 96% on BiPAP and was subsequently transitioned to 3 L/min oxygen via nasal cannula.  Pt has had multiple hospitalizations in recent 2 years per chart. He endorsed nasopharyngeal surgery for obstructive sleep apnea by ENT ~12 years ago.  CXR: "Minimal right basilar subsegmental atelectasis" -- pt had episodes of severe vomiting prior to admission per report.    Assessment / Plan / Recommendation Clinical Impression  Pt appears to present w/ adequate oropharyngeal phase swallowing function w/ No overt clinical s/s of aspiration  noted during/post trials. Pt appears at reduced risk for aspiration when following general aspiration precautions. Pt required min support and cues for sitting fully upright. Setup support w/ po's. Pt then fed self w/ no overt s/s of aspiration noted; no decline in vocal quality or decline in respiratory status during/post trials. Mild Belching noted. Pt denied any REFLUX but noted a PMH of GERD in chart. He denied taking anything for GERD or having  Reflux at home. Oral phase appeared [facility] for bolus management, mastication, A-P transfer and oral clearing. OM exam appeared [facility] w/ no unilateral weakness noted. Recommend a Regular diet w/ thin liquids w/ general aspiration precautions; GERD/REFLUX precautions d/t baseline GERD dx. Recommend pills in puree if needed for ease of swallowing. No further skilled ST services indicated at this time as pt appears at his baseline. Discussed need for following aspiration and Reflux precautions to increase safety w/ oral intake -- including not drinking w/ increased distractions such as driving/riding in car, lying in bed.  SLP Visit Diagnosis: Dysphagia, unspecified (R13.10)    Aspiration Risk  (reduced following general precautions)    Diet Recommendation  Regular diet w/ thin liquids. General aspiration precautions. REFLUX/GERD precautions.   Medication Administration: Whole meds with liquid(w/ a puree if needed)    Other  Recommendations Recommended Consults: Consider GI evaluation(for ongoing management) Oral Care Recommendations: Oral care BID;Staff/trained caregiver to provide oral care Other Recommendations: (n/a)   Follow up Recommendations None      Frequency and Duration (n/a)  (n/a)       Prognosis Prognosis for Safe Diet Advancement: Good Barriers to Reach Goals: (GERD)      Swallow Study   General Date of Onset: 11/17/19 HPI: Pt is an 86 y.o. male with multiple medical problems including but not limited to GERD, pulmonary embolism, hypertension, stage IIIb CKD, depression, COPD, history of chronic hypoxemic respiratory failure requiring oxygen, stroke, diabetes mellitus, hyperlipidemia, femur fracture, presented to the hospital with increasing shortness of breath.  Patient said he has been having a cough and shortness of breath for almost a month now.  He was treated by his PCP w/ little success then returned to his PCP yesterday because of persistent cough and increasing shortness  of breath.  He said on the way to the doctor's office he vomited several times in the car.  He was given a prescription of Augmentin and was told to go to the emergency room if he did not get any better.  IN the ED, patient was tachypneic, tachycardic in the emergency room.  Oxygen saturation was 96% on BiPAP and was subsequently transitioned to 3 L/min oxygen via nasal cannula.  Pt has had multiple hospitalizations in recent 2 years per chart. He endorsed nasopharyngeal surgery for obstructive sleep apnea by ENT ~12 years ago.  CXR: "Minimal right basilar subsegmental atelectasis" -- pt had episodes of severe vomiting prior to admission per report.  Type of Study: Bedside Swallow Evaluation Previous Swallow Assessment: none Diet Prior to this Study: NPO(Regular at home) Temperature Spikes Noted: No(wbc 8.7) Respiratory Status: Nasal cannula(2-3 L) History of Recent Intubation: No Behavior/Cognition: Alert;Cooperative;Pleasant mood;Distractible;Requires cueing Oral Cavity Assessment: Within Functional Limits Oral Care Completed by SLP: Recent completion by staff Oral Cavity - Dentition: Adequate natural dentition Vision: Functional for self-feeding Self-Feeding Abilities: Able to feed self;Needs set up Patient Positioning: Upright in bed(needed min positioning support/cues) Baseline Vocal Quality: Normal(slight velopharyngeal decreased precision noted) Volitional Cough: Strong Volitional Swallow: Able to elicit    Oral/Motor/Sensory  Function Overall Oral Motor/Sensory Function: Within functional limits   Ice Chips Ice chips: Within functional limits Presentation: Spoon(fed; 2 trials)   Thin Liquid Thin Liquid: Within functional limits Presentation: Cup;Self Fed;Straw(10 trials total) Other Comments: juice, water    Nectar Thick Nectar Thick Liquid: Not tested   Honey Thick Honey Thick Liquid: Not tested   Puree Puree: Within functional limits Presentation: Self Fed;Spoon(6+ trials)    Solid     Solid: Within functional limits Presentation: Self Fed(4 trials) Other Comments: graham crackers       Jerilynn Som, MS, CCC-SLP Kenitra Leventhal 11/18/2019,11:23 AM

## 2019-11-19 LAB — CREATININE, SERUM
Creatinine, Ser: 1.55 mg/dL — ABNORMAL HIGH (ref 0.61–1.24)
GFR calc Af Amer: 47 mL/min — ABNORMAL LOW (ref >60)
GFR calc non Af Amer: 40 mL/min — ABNORMAL LOW (ref >60)

## 2019-11-19 LAB — GLUCOSE, CAPILLARY
Glucose-Capillary: 100 mg/dL — ABNORMAL HIGH (ref 70–99)
Glucose-Capillary: 124 mg/dL — ABNORMAL HIGH (ref 70–99)
Glucose-Capillary: 74 mg/dL (ref 70–99)
Glucose-Capillary: 77 mg/dL (ref 70–99)
Glucose-Capillary: 87 mg/dL (ref 70–99)

## 2019-11-19 NOTE — Progress Notes (Addendum)
Progress Note    Gary Thomas  JKK:[id number] DOB: 24-Mar-1932  DOA: 11/17/2019 PCP: Marguarite Arbour, MD      Brief Narrative:    Medical records reviewed and are as summarized below:  Gary Thomas is an 86 y.o. male Gary Thomas is an 86 y.o. male with multiple medical problems including but not limited to stroke, pulmonary embolism, hypertension, stage IIIb CKD, depression, COPD, history of chronic hypoxemic respiratory failure requiring oxygen in the past but has not used oxygen for 4 to 5 years, diabetes mellitus, hyperlipidemia, femur fracture, presented to the hospital with increasing shortness of breath.  Patient said he has been having a cough and shortness of breath for almost a month now.  He saw his primary care physician and he was initially prescribed amoxicillin but he did not do him any good.  He was subsequently prescribed Levaquin which did not help either.  He said he went to see his primary care physician yesterday because of persistent cough and increasing shortness of breath.  He said on the way to the doctor's office he vomited several times in the car.  He was given a prescription of Augmentin and was told to go to the emergency room if he did not get any better.  Today, shortness of breath worsened so EMS was called.  Reportedly, his oxygen saturation was 81% on room air when EMS arrived so he was placed on BiPAP.    No chest pain, wheezing, fever or chills.  No diarrhea or abdominal pain.  ED Course:  The patient was tachypneic, tachycardic in the emergency room.  Oxygen saturation was 96% on BiPAP and was subsequently transitioned to 3 L/min oxygen via nasal cannula.  Initial lactic acid level was 2 but it went up to 3.7.  He was given IV Solu-Medrol, IV cefepime, IV vancomycin, 3 L of normal saline and nebulized albuterol and ipratropium      Assessment/Plan:   Principal Problem:   Sepsis (HCC) Active Problems:   Community acquired  pneumonia   Acute hypoxemic respiratory failure (HCC)  Sepsis secondary to pneumonia:  He failed outpatient antibiotics. Continue empiric vancomycin and cefepime.  Follow-up blood cultures.  He was evaluated by the speech therapist and there was no evidence of oropharyngeal dysphagia and regular diet was recommended.  He has tolerated diet thus far.  Acute hypoxemic respiratory failure: He is down to 2 to 2.5 L/min oxygen via nasal cannula.  Taper off as able.  Insulin-dependent diabetes mellitus: Glucose levels have been okay without NovoLog Mix.  Continue to hold NovoLog Mix for now. Use NovoLog as needed for hyperglycemia  COPD: Compensated.  Continue bronchodilators.  CKD stage IIIb: Creatinine appears to be at baseline.  Monitor BMP.  Patient has GERD listed on his medical history.  However, he said he has not had any problems with reflux lately so he declined an offer for PPI.   Body mass index is 32.43 kg/m.  (Obesity)   Family Communication/Anticipated D/C date and plan/Code Status   DVT prophylaxis: Lovenox Code Status: Full code Family Communication: Plan discussed with the patient Disposition Plan: Possible discharge to home in 1 to 2 days if hypoxemia improves      Subjective:   No complaints.  He thinks he is improving day by day.  He has an occasional cough.  No vomiting, shortness of breath or chest pain.  Objective:    Vitals:   11/18/19 2016 11/19/19 0425 11/19/19  0733 11/19/19 0748  BP:  (!) 148/69  (!) 156/74  Pulse:  65  66  Resp:  19  18  Temp:  97.8 F (36.6 C)  (!) 97.4 F (36.3 C)  TempSrc:  Oral  Oral  SpO2: 92% 98% 98% 98%  Weight:      Height:        Intake/Output Summary (Last 24 hours) at 11/19/2019 1229 Last data filed at 11/19/2019 0830 Gross per 24 hour  Intake 253.12 ml  Output 1400 ml  Net -1146.88 ml   Filed Weights   11/17/19 1257 11/17/19 1733  Weight: 97.1 kg 99.6 kg    Exam:  GEN: NAD SKIN: Warm and dry, no  ulcers EYES: EOMI ENT: MMM CV: RRR PULM: Air entry adequate bilaterally, no wheezing heard.  He has right basilar rales ABD: soft, ND, NT, +BS, + urostomy CNS: AAO x 3, non focal EXT: No edema or tenderness   Data Reviewed:   I have personally reviewed following labs and imaging studies:  Labs: Labs show the following:   Basic Metabolic Panel: Recent Labs  Lab 11/17/19 1113 11/18/19 0531 11/19/19 0407  NA 140 140  --   K 5.0 4.9  --   CL 106 111  --   CO2 24 23  --   GLUCOSE 276* 130*  --   BUN 55* 41*  --   CREATININE 1.85* 1.61* 1.55*  CALCIUM 9.1 8.5*  --    GFR Estimated Creatinine Clearance: 40.6 mL/min (A) (by C-G formula based on SCr of 1.55 mg/dL (H)). Liver Function Tests: Recent Labs  Lab 11/17/19 1113  AST 26  ALT 17  ALKPHOS 74  BILITOT 0.5  PROT 7.2  ALBUMIN 3.7   Recent Labs  Lab 11/17/19 1113  LIPASE 33   No results for input(s): AMMONIA in the last 168 hours. Coagulation profile Recent Labs  Lab 11/17/19 1113  INR 1.0    CBC: Recent Labs  Lab 11/17/19 1113 11/18/19 0531  WBC 4.5 8.7  NEUTROABS 3.9  --   HGB 14.5 12.8*  HCT 45.5 40.4  MCV 91.9 92.7  PLT 117* 102*   Cardiac Enzymes: No results for input(s): CKTOTAL, CKMB, CKMBINDEX, TROPONINI in the last 168 hours. BNP (last 3 results) No results for input(s): PROBNP in the last 8760 hours. CBG: Recent Labs  Lab 11/18/19 1130 11/18/19 1626 11/18/19 2345 11/19/19 0606 11/19/19 0752  GLUCAP 105* 102* 114* 87 74   D-Dimer: No results for input(s): DDIMER in the last 72 hours. Hgb A1c: No results for input(s): HGBA1C in the last 72 hours. Lipid Profile: No results for input(s): CHOL, HDL, LDLCALC, TRIG, CHOLHDL, LDLDIRECT in the last 72 hours. Thyroid function studies: No results for input(s): TSH, T4TOTAL, T3FREE, THYROIDAB in the last 72 hours.  Invalid input(s): FREET3 Anemia work up: No results for input(s): VITAMINB12, FOLATE, FERRITIN, TIBC, IRON,  RETICCTPCT in the last 72 hours. Sepsis Labs: Recent Labs  Lab 11/17/19 1113 11/17/19 1119 11/17/19 1416 11/17/19 1643 11/17/19 1807 11/18/19 0531  PROCALCITON 0.11  --   --   --   --   --   WBC 4.5  --   --   --   --  8.7  LATICACIDVEN  --  2.0* 3.7* 5.5* 4.3*  --     Microbiology Recent Results (from the past 240 hour(s))  MRSA PCR Screening     Status: Abnormal   Collection Time: 11/17/19  9:24 AM   Specimen: Nasopharyngeal  Result Value Ref Range Status   MRSA by PCR POSITIVE (A) NEGATIVE Final    Comment:        The GeneXpert MRSA Assay (FDA approved for NASAL specimens only), is one component of a comprehensive MRSA colonization surveillance program. It is not intended to diagnose MRSA infection nor to guide or monitor treatment for MRSA infections. RESULT CALLED TO, READ BACK BY AND VERIFIED WITH: BERENICE ALEJO AT 1117 ON 11/18/2019 [facility]. Performed at [facility], [address] [place], [place] [address]   Blood Culture (routine x 2)     Status: None (Preliminary result)   Collection Time: 11/17/19 11:37 AM   Specimen: BLOOD  Result Value Ref Range Status   Specimen Description BLOOD RIGHT WRIST  Final   Special Requests NONE  Final   Culture   Final    NO GROWTH 2 DAYS Performed at [facility], [address] [place], [place] [address]    Report Status PENDING  Incomplete  Blood Culture (routine x 2)     Status: None (Preliminary result)   Collection Time: 11/17/19 11:37 AM   Specimen: BLOOD  Result Value Ref Range Status   Specimen Description BLOOD RIGHT HAND  Final   Special Requests   Final    BOTTLES DRAWN AEROBIC AND ANAEROBIC Blood Culture results may not be optimal due to an excessive volume of blood received in culture bottles   Culture   Final    NO GROWTH 2 DAYS Performed at [facility], [address] [place], [place] [address]    Report Status PENDING  Incomplete  Respiratory Panel by RT PCR (Flu A&B,  Covid) - Nasopharyngeal Swab     Status: None   Collection Time: 11/17/19 12:26 PM   Specimen: Nasopharyngeal Swab  Result Value Ref Range Status   SARS Coronavirus 2 by RT PCR NEGATIVE NEGATIVE Final    Comment: (NOTE) SARS-CoV-2 target nucleic acids are NOT DETECTED. The SARS-CoV-2 RNA is generally detectable in upper respiratoy specimens during the acute phase of infection. The lowest concentration of SARS-CoV-2 viral copies this assay can detect is 131 copies/mL. A negative result does not preclude SARS-Cov-2 infection and should not be used as the sole basis for treatment or other patient management decisions. A negative result may occur with  improper specimen collection/handling, submission of specimen other than nasopharyngeal swab, presence of viral mutation(s) within the areas targeted by this assay, and inadequate number of viral copies (<131 copies/mL). A negative result must be combined with clinical observations, patient history, and epidemiological information. The expected result is Negative. Fact Sheet for Patients:  https://www.moore.com/https://www.fda.gov/media/142436/download Fact Sheet for Healthcare Providers:  https://www.young.biz/https://www.fda.gov/media/142435/download This test is not yet ap proved or cleared by the [place] FDA and  has been authorized for detection and/or diagnosis of SARS-CoV-2 by FDA under an Emergency Use Authorization (EUA). This EUA will remain  in effect (meaning this test can be used) for the duration of the COVID-19 declaration under Section 564(b)(1) of the Act, 21 U.S.C. section 360bbb-3(b)(1), unless the authorization is terminated or revoked sooner.    Influenza A by PCR NEGATIVE NEGATIVE Final   Influenza B by PCR NEGATIVE NEGATIVE Final    Comment: (NOTE) The Xpert Xpress SARS-CoV-2/FLU/RSV assay is intended as an aid in  the diagnosis of influenza from Nasopharyngeal swab specimens and  should not be used as a sole basis for treatment. Nasal washings and   aspirates are unacceptable for Xpert Xpress SARS-CoV-2/FLU/RSV  testing.  Fact Sheet for Patients: PinkCheek.be Fact Sheet for Healthcare Providers: GravelBags.it This test is not yet approved or cleared by the [place] FDA and  has been authorized for detection and/or diagnosis of SARS-CoV-2 by  FDA under an Emergency Use Authorization (EUA). This EUA will remain  in effect (meaning this test can be used) for the duration of the  Covid-19 declaration under Section 564(b)(1) of the Act, 21  U.S.C. section 360bbb-3(b)(1), unless the authorization is  terminated or revoked. Performed at [facility], [address], [place], [place] [address]   Urine culture     Status: None   Collection Time: 11/17/19  1:37 PM   Specimen: In/Out Cath Urine  Result Value Ref Range Status   Specimen Description   Final    IN/OUT CATH URINE Performed at [facility], [address] [place], [place] [address]    Special Requests   Final    NONE Performed at [facility], [address], [place], [place] [address]    Culture   Final    NO GROWTH Performed at [facility], [place] [address], [place], [place] [address]    Report Status 11/18/2019 FINAL  Final    Procedures and diagnostic studies:  CT Angio Chest PE W and/or Wo Contrast  Result Date: 11/17/2019 CLINICAL DATA:  Increasing shortness of breath. Hypoxemia. EXAM: CT ANGIOGRAPHY CHEST WITH CONTRAST TECHNIQUE: Multidetector CT imaging of the chest was performed using the standard protocol during bolus administration of intravenous contrast. Multiplanar CT image reconstructions and MIPs were obtained to evaluate the vascular anatomy. CONTRAST:  65mL OMNIPAQUE IOHEXOL 350 MG/ML SOLN COMPARISON:  Chest x-ray dated 11/17/2019 and chest CT dated 11/20/2008 FINDINGS: Cardiovascular: There are no pulmonary emboli. Aortic atherosclerosis. Enlarged main  pulmonary arteries. Overall heart size is normal. No pericardial effusion. Mediastinum/Nodes: Chronic bilateral thyroid nodules, unchanged since 2010. No significant abnormality of the esophagus trachea. No hilar or mediastinal adenopathy. Lungs/Pleura: There small hazy infiltrates in the superior aspect of the left lower lobe. There is new atelectasis and consolidation in the posterior aspect of the right lower lobe. Increased chronic elevation of the right hemidiaphragm contributes to the atelectasis. No effusions. Upper Abdomen: No acute abnormality. Multiple hepatic cysts as demonstrated on the prior exam. Musculoskeletal: No chest wall abnormality. No acute or significant osseous findings. Review of the MIP images confirms the above findings. IMPRESSION: 1. No pulmonary emboli. 2. New atelectasis and consolidation in the posterior aspect of the right lower lobe. 3. Small hazy infiltrates in the superior aspect of the left lower lobe. 4. Chronic elevation of the right hemidiaphragm. 5. Enlarged main pulmonary arteries consistent with pulmonary arterial hypertension. Aortic Atherosclerosis (ICD10-I70.0). Electronically Signed   By: Lorriane Shire M.D.   On: 11/17/2019 14:42    Medications:   . allopurinol  50 mg Oral Q48H  . atorvastatin  20 mg Oral QHS  . budesonide  0.5 mg Nebulization BID  . enoxaparin (LOVENOX) injection  40 mg Subcutaneous Q24H  . insulin aspart  0-15 Units Subcutaneous TID WC  . metoprolol succinate  100 mg Oral QHS  . mirtazapine  15 mg Oral QHS  . pantoprazole  40 mg Oral Daily  . sertraline  100 mg Oral QHS  . tiotropium  18 mcg Inhalation Daily   Continuous Infusions: . sodium chloride Stopped (11/18/19 0919)  . ceFEPime (MAXIPIME) IV 2 g (11/19/19 1016)  . vancomycin Stopped (11/18/19 2256)     LOS: 2 days  Lurene Shadow  [facility]     11/19/2019, 12:29 PM              Progress Note    Gary Thomas  ZOX:[id number] DOB:  1932-02-16  DOA: 11/17/2019 PCP: Marguarite Arbour, MD      Brief Narrative:    Medical records reviewed and are as summarized below:  Gary Thomas is an 86 y.o. male Gary Thomas is an 86 y.o. male with multiple medical problems including but not limited to stroke, pulmonary embolism, hypertension, stage IIIb CKD, depression, COPD, history of chronic hypoxemic respiratory failure requiring oxygen in the past but has not used oxygen for 4 to 5 years, diabetes mellitus, hyperlipidemia, femur fracture, presented to the hospital with increasing shortness of breath.  Patient said he has been having a cough and shortness of breath for almost a month now.  He saw his primary care physician and he was initially prescribed amoxicillin but he did not do him any good.  He was subsequently prescribed Levaquin which did not help either.  He said he went to see his primary care physician yesterday because of persistent cough and increasing shortness of breath.  He said on the way to the doctor's office he vomited several times in the car.  He was given a prescription of Augmentin and was told to go to the emergency room if he did not get any better.  Today, shortness of breath worsened so EMS was called.  Reportedly, his oxygen saturation was 81% on room air when EMS arrived so he was placed on BiPAP.    No chest pain, wheezing, fever or chills.  No diarrhea or abdominal pain.  ED Course:  The patient was tachypneic, tachycardic in the emergency room.  Oxygen saturation was 96% on BiPAP and was subsequently transitioned to 3 L/min oxygen via nasal cannula.  Initial lactic acid level was 2 but it went up to 3.7.  He was given IV Solu-Medrol, IV cefepime, IV vancomycin, 3 L of normal saline and nebulized albuterol and ipratropium      Assessment/Plan:   Principal Problem:   Sepsis (HCC) Active Problems:   Community acquired pneumonia   Acute hypoxemic respiratory failure (HCC)  Sepsis secondary  to pneumonia:  He failed outpatient antibiotics. Continue empiric vancomycin and cefepime.  Follow-up blood cultures.  He was evaluated by the speech therapist and also no evidence of oropharyngeal dysphagia and regular diet was recommended.  Acute hypoxemic respiratory failure: He is down to 2 L/min oxygen via nasal cannula.  Taper off as able.  Insulin-dependent diabetes mellitus: Hold NovoLog Mix for now. Use NovoLog as needed for hyperglycemia  COPD: Compensated.  Continue bronchodilators.  CKD stage IIIb: Creatinine appears to be at baseline.  Monitor BMP.     Body mass index is 32.43 kg/m.  (Obesity)   Family Communication/Anticipated D/C date and plan/Code Status   DVT prophylaxis: Lovenox Code Status: Full code Family Communication: Plan discussed with the patient and his wife at the bedside Disposition Plan: Possible discharge to home in 2 to 3 days      Subjective:   No complaints.  He feels better today.  No shortness of breath or chest pain.  Objective:    Vitals:   11/18/19 2016 11/19/19 0425 11/19/19 0733 11/19/19 0748  BP:  (!) 148/69  (!) 156/74  Pulse:  65  66  Resp:  19  18  Temp:  97.8 F (36.6 C)  (!) 97.4  F (36.3 C)  TempSrc:  Oral  Oral  SpO2: 92% 98% 98% 98%  Weight:      Height:        Intake/Output Summary (Last 24 hours) at 11/19/2019 1229 Last data filed at 11/19/2019 0830 Gross per 24 hour  Intake 253.12 ml  Output 1400 ml  Net -1146.88 ml   Filed Weights   11/17/19 1257 11/17/19 1733  Weight: 97.1 kg 99.6 kg    Exam:  GEN: NAD SKIN: Warm and dry, no ulcers EYES: EOMI ENT: MMM CV: RRR PULM: Air entry adequate bilaterally, no wheezing heard.  He has right basilar rales ABD: soft, ND, NT, +BS, + colostomy CNS: AAO x 3, non focal EXT: No edema or tenderness   Data Reviewed:   I have personally reviewed following labs and imaging studies:  Labs: Labs show the following:   Basic Metabolic Panel: Recent Labs   Lab 11/17/19 1113 11/18/19 0531 11/19/19 0407  NA 140 140  --   K 5.0 4.9  --   CL 106 111  --   CO2 24 23  --   GLUCOSE 276* 130*  --   BUN 55* 41*  --   CREATININE 1.85* 1.61* 1.55*  CALCIUM 9.1 8.5*  --    GFR Estimated Creatinine Clearance: 40.6 mL/min (A) (by C-G formula based on SCr of 1.55 mg/dL (H)). Liver Function Tests: Recent Labs  Lab 11/17/19 1113  AST 26  ALT 17  ALKPHOS 74  BILITOT 0.5  PROT 7.2  ALBUMIN 3.7   Recent Labs  Lab 11/17/19 1113  LIPASE 33   No results for input(s): AMMONIA in the last 168 hours. Coagulation profile Recent Labs  Lab 11/17/19 1113  INR 1.0    CBC: Recent Labs  Lab 11/17/19 1113 11/18/19 0531  WBC 4.5 8.7  NEUTROABS 3.9  --   HGB 14.5 12.8*  HCT 45.5 40.4  MCV 91.9 92.7  PLT 117* 102*   Cardiac Enzymes: No results for input(s): CKTOTAL, CKMB, CKMBINDEX, TROPONINI in the last 168 hours. BNP (last 3 results) No results for input(s): PROBNP in the last 8760 hours. CBG: Recent Labs  Lab 11/18/19 1130 11/18/19 1626 11/18/19 2345 11/19/19 0606 11/19/19 0752  GLUCAP 105* 102* 114* 87 74   D-Dimer: No results for input(s): DDIMER in the last 72 hours. Hgb A1c: No results for input(s): HGBA1C in the last 72 hours. Lipid Profile: No results for input(s): CHOL, HDL, LDLCALC, TRIG, CHOLHDL, LDLDIRECT in the last 72 hours. Thyroid function studies: No results for input(s): TSH, T4TOTAL, T3FREE, THYROIDAB in the last 72 hours.  Invalid input(s): FREET3 Anemia work up: No results for input(s): VITAMINB12, FOLATE, FERRITIN, TIBC, IRON, RETICCTPCT in the last 72 hours. Sepsis Labs: Recent Labs  Lab 11/17/19 1113 11/17/19 1119 11/17/19 1416 11/17/19 1643 11/17/19 1807 11/18/19 0531  PROCALCITON 0.11  --   --   --   --   --   WBC 4.5  --   --   --   --  8.7  LATICACIDVEN  --  2.0* 3.7* 5.5* 4.3*  --     Microbiology Recent Results (from the past 240 hour(s))  MRSA PCR Screening     Status: Abnormal    Collection Time: 11/17/19  9:24 AM   Specimen: Nasopharyngeal  Result Value Ref Range Status   MRSA by PCR POSITIVE (A) NEGATIVE Final    Comment:        The GeneXpert MRSA Assay (FDA approved for  NASAL specimens only), is one component of a comprehensive MRSA colonization surveillance program. It is not intended to diagnose MRSA infection nor to guide or monitor treatment for MRSA infections. RESULT CALLED TO, READ BACK BY AND VERIFIED WITH: BERENICE ALEJO AT 1117 ON 11/18/2019 [facility]. Performed at [facility], [address] [place], [place] [address]   Blood Culture (routine x 2)     Status: None (Preliminary result)   Collection Time: 11/17/19 11:37 AM   Specimen: BLOOD  Result Value Ref Range Status   Specimen Description BLOOD RIGHT WRIST  Final   Special Requests NONE  Final   Culture   Final    NO GROWTH 2 DAYS Performed at [facility], [address] [place], [place] [address]    Report Status PENDING  Incomplete  Blood Culture (routine x 2)     Status: None (Preliminary result)   Collection Time: 11/17/19 11:37 AM   Specimen: BLOOD  Result Value Ref Range Status   Specimen Description BLOOD RIGHT HAND  Final   Special Requests   Final    BOTTLES DRAWN AEROBIC AND ANAEROBIC Blood Culture results may not be optimal due to an excessive volume of blood received in culture bottles   Culture   Final    NO GROWTH 2 DAYS Performed at [facility], [address], [place], [place] [address]    Report Status PENDING  Incomplete  Respiratory Panel by RT PCR (Flu A&B, Covid) - Nasopharyngeal Swab     Status: None   Collection Time: 11/17/19 12:26 PM   Specimen: Nasopharyngeal Swab  Result Value Ref Range Status   SARS Coronavirus 2 by RT PCR NEGATIVE NEGATIVE Final    Comment: (NOTE) SARS-CoV-2 target nucleic acids are NOT DETECTED. The SARS-CoV-2 RNA is generally detectable in upper respiratoy specimens during the acute phase of  infection. The lowest concentration of SARS-CoV-2 viral copies this assay can detect is 131 copies/mL. A negative result does not preclude SARS-Cov-2 infection and should not be used as the sole basis for treatment or other patient management decisions. A negative result may occur with  improper specimen collection/handling, submission of specimen other than nasopharyngeal swab, presence of viral mutation(s) within the areas targeted by this assay, and inadequate number of viral copies (<131 copies/mL). A negative result must be combined with clinical observations, patient history, and epidemiological information. The expected result is Negative. Fact Sheet for Patients:  https://www.moore.com/ Fact Sheet for Healthcare Providers:  https://www.young.biz/ This test is not yet ap proved or cleared by the [place] FDA and  has been authorized for detection and/or diagnosis of SARS-CoV-2 by FDA under an Emergency Use Authorization (EUA). This EUA will remain  in effect (meaning this test can be used) for the duration of the COVID-19 declaration under Section 564(b)(1) of the Act, 21 U.S.C. section 360bbb-3(b)(1), unless the authorization is terminated or revoked sooner.    Influenza A by PCR NEGATIVE NEGATIVE Final   Influenza B by PCR NEGATIVE NEGATIVE Final    Comment: (NOTE) The Xpert Xpress SARS-CoV-2/FLU/RSV assay is intended as an aid in  the diagnosis of influenza from Nasopharyngeal swab specimens and  should not be used as a sole basis for treatment. Nasal washings and  aspirates are unacceptable for Xpert Xpress SARS-CoV-2/FLU/RSV  testing. Fact Sheet for Patients: https://www.moore.com/ Fact Sheet for Healthcare Providers: https://www.young.biz/ This test is not yet approved or cleared by the [place] FDA and  has been authorized for detection and/or diagnosis  of SARS-CoV-2 by  FDA under  an Emergency Use Authorization (EUA). This EUA will remain  in effect (meaning this test can be used) for the duration of the  Covid-19 declaration under Section 564(b)(1) of the Act, 21  U.S.C. section 360bbb-3(b)(1), unless the authorization is  terminated or revoked. Performed at [facility], [address], [place], [place] [address]   Urine culture     Status: None   Collection Time: 11/17/19  1:37 PM   Specimen: In/Out Cath Urine  Result Value Ref Range Status   Specimen Description   Final    IN/OUT CATH URINE Performed at [facility], [address] [place], [place] [address]    Special Requests   Final    NONE Performed at [facility], [address] [place], [place] [address]    Culture   Final    NO GROWTH Performed at [facility], [address] [place], [place] [address]    Report Status 11/18/2019 FINAL  Final    Procedures and diagnostic studies:  CT Angio Chest PE W and/or Wo Contrast  Result Date: 11/17/2019 CLINICAL DATA:  Increasing shortness of breath. Hypoxemia. EXAM: CT ANGIOGRAPHY CHEST WITH CONTRAST TECHNIQUE: Multidetector CT imaging of the chest was performed using the standard protocol during bolus administration of intravenous contrast. Multiplanar CT image reconstructions and MIPs were obtained to evaluate the vascular anatomy. CONTRAST:  61mL OMNIPAQUE IOHEXOL 350 MG/ML SOLN COMPARISON:  Chest x-ray dated 11/17/2019 and chest CT dated 11/20/2008 FINDINGS: Cardiovascular: There are no pulmonary emboli. Aortic atherosclerosis. Enlarged main pulmonary arteries. Overall heart size is normal. No pericardial effusion. Mediastinum/Nodes: Chronic bilateral thyroid nodules, unchanged since 2010. No significant abnormality of the esophagus trachea. No hilar or mediastinal adenopathy. Lungs/Pleura: There small hazy infiltrates in the superior aspect of the left lower lobe. There is new atelectasis and consolidation in the  posterior aspect of the right lower lobe. Increased chronic elevation of the right hemidiaphragm contributes to the atelectasis. No effusions. Upper Abdomen: No acute abnormality. Multiple hepatic cysts as demonstrated on the prior exam. Musculoskeletal: No chest wall abnormality. No acute or significant osseous findings. Review of the MIP images confirms the above findings. IMPRESSION: 1. No pulmonary emboli. 2. New atelectasis and consolidation in the posterior aspect of the right lower lobe. 3. Small hazy infiltrates in the superior aspect of the left lower lobe. 4. Chronic elevation of the right hemidiaphragm. 5. Enlarged main pulmonary arteries consistent with pulmonary arterial hypertension. Aortic Atherosclerosis (ICD10-I70.0). Electronically Signed   By: Francene Boyers M.D.   On: 11/17/2019 14:42    Medications:   . allopurinol  50 mg Oral Q48H  . atorvastatin  20 mg Oral QHS  . budesonide  0.5 mg Nebulization BID  . enoxaparin (LOVENOX) injection  40 mg Subcutaneous Q24H  . insulin aspart  0-15 Units Subcutaneous TID WC  . metoprolol succinate  100 mg Oral QHS  . mirtazapine  15 mg Oral QHS  . pantoprazole  40 mg Oral Daily  . sertraline  100 mg Oral QHS  . tiotropium  18 mcg Inhalation Daily   Continuous Infusions: . sodium chloride Stopped (11/18/19 0919)  . ceFEPime (MAXIPIME) IV 2 g (11/19/19 1016)  . vancomycin Stopped (11/18/19 2256)     LOS: 2 days   Story Conti  [facility]     11/19/2019, 12:29 PM

## 2019-11-20 LAB — GLUCOSE, CAPILLARY
Glucose-Capillary: 119 mg/dL — ABNORMAL HIGH (ref 70–99)
Glucose-Capillary: 102 mg/dL — ABNORMAL HIGH (ref 70–99)

## 2019-11-20 MED ORDER — FUROSEMIDE 40 MG PO TABS: 40.0000 mg | ORAL_TABLET | Freq: Every day | ORAL | Status: DC

## 2019-11-20 MED ORDER — AMLODIPINE BESYLATE 5 MG PO TABS: 5.0000 mg | ORAL_TABLET | Freq: Every day | ORAL | 0 refills | Status: DC

## 2019-11-20 MED ORDER — DOXYCYCLINE HYCLATE 100 MG PO TABS: 100.0000 mg | ORAL_TABLET | Freq: Two times a day (BID) | ORAL | 0 refills | Status: AC

## 2019-11-20 MED ORDER — AMOXICILLIN-POT CLAVULANATE 875-125 MG PO TABS: 1.0000 | ORAL_TABLET | Freq: Two times a day (BID) | ORAL | 0 refills | Status: AC

## 2019-11-20 MED ORDER — AMLODIPINE BESYLATE 5 MG PO TABS
5.0000 mg | ORAL_TABLET | Freq: Every day | ORAL | Status: DC
  Administered 2019-11-20: 12:00:00 5 mg via ORAL
  Filled 2019-11-20: qty 1

## 2019-11-20 MED ORDER — FUROSEMIDE 40 MG PO TABS
40.0000 mg | ORAL_TABLET | Freq: Once | ORAL | Status: AC
  Administered 2019-11-20: 10:00:00 40 mg via ORAL
  Filled 2019-11-20: qty 1

## 2019-11-20 NOTE — Care Management Important Message (Signed)
Important Message  Patient Details  Name: Gary Thomas MRN: [id number] Date of Birth: 19-Oct-1931   Medicare Important Message Given:  Yes     Johnell Comings 11/20/2019, 11:26 AM

## 2019-11-20 NOTE — Progress Notes (Signed)
Patient discharged to home wit wife.  Tele and IV d/c'd prior to discharge. Patient verbalizes understanding of discharge instructions.

## 2019-11-20 NOTE — Discharge Summary (Addendum)
Physician Discharge Summary  Gary Thomas SHF:[id number] DOB: 02-18-1932 DOA: 11/17/2019  PCP: Marguarite Arbour, MD  Admit date: 11/17/2019 Discharge date: 11/20/2019  Discharge disposition: Home   Recommendations for Outpatient Follow-Up:   Follow-up with PCP in 1 week   Discharge Diagnosis:   Principal Problem:   Sepsis [facility]) Active Problems:   Community acquired pneumonia   Acute hypoxemic respiratory failure (HCC)    Discharge Condition: Stable.  Diet recommendation: Low-salt diet, diabetic diet  Code status: Full code.    Hospital Course:   Gary Thomas is an 86 y.o. male Gary R Battenis an 86 y.o.malewith multiple medical problems including but not limited to stroke, pulmonary embolism, hypertension, stage IIIb CKD, depression,COPD, history of chronic hypoxemic respiratory failure requiring oxygen in the past but has not used oxygen for 4 to 5 years,diabetes mellitus, hyperlipidemia, femur fracture, presented to the hospital with increasing shortness of breath. Patient said he has been having a cough and shortness of breath for almost a month now. He saw his primary care physician and he was initially prescribed amoxicillin but he did not do him any good. He was subsequently prescribed Levaquin which did not help either. He said he went to see his primary care physician yesterday because of persistent cough and increasing shortness of breath. He said on the way to the doctor's office he vomited several times in the car. He was given a prescription of Augmentin and was told to go to the emergency room if he did not get any better. Today, shortness of breath worsened so EMS was called. Reportedly, his oxygen saturation was 81% on room air when EMS arrived so he was placed on BiPAP.  He was admitted to the hospital for sepsis, community-acquired pneumonia and acute hypoxemic respiratory failure.  He was treated with empiric IV antibiotics (vancomycin and  cefepime).  He was evaluated by the speech therapist possible dysphagia but his swallowing was okay and regular diet was recommended.  Blood culture did not show any growth with MRSA screen was positive.  His condition improved significantly.  He was successfully weaned off of oxygen and oxygen saturation was 96% on room air.  In the hospital, he was not treated with insulin but his glucose levels were within normal limits.  Patient has been advised not to take insulin at home because of concern for hypoglycemia.  In the hospital, his blood pressure was uncontrolled.  He said at home there are times when his blood pressure has been in the 200s.  Amlodipine was prescribed for adequate blood pressure control.  At baseline, patient says he does not walk and normally gets around with a wheelchair.  He is deemed stable for discharge to home today.     Discharge Exam:   Vitals:   11/20/19 1134 11/20/19 1257  BP: 118/70   Pulse: 74   Resp: 17   Temp: 97.7 F (36.5 C)   SpO2: 97% 96%   Vitals:   11/20/19 0737 11/20/19 0813 11/20/19 1134 11/20/19 1257  BP: (!) 179/76  118/70   Pulse: 61  74   Resp: 18  17   Temp: 97.6 F (36.4 C)  97.7 F (36.5 C)   TempSrc: Oral  Oral   SpO2: 95% 98% 97% 96%  Weight:      Height:         GEN: NAD SKIN: No rash EYES: EOMI ENT: MMM CV: RRR PULM: Air entry adequate bilaterally.  No wheezing.  Right  basilar rales ABD: soft, ND, NT, +BS CNS: AAO x 3, non focal EXT: No edema or tenderness   The results of significant diagnostics from this hospitalization (including imaging, microbiology, ancillary and laboratory) are listed below for reference.     Procedures and Diagnostic Studies:   CT Angio Chest PE W and/or Wo Contrast  Result Date: 11/17/2019 CLINICAL DATA:  Increasing shortness of breath. Hypoxemia. EXAM: CT ANGIOGRAPHY CHEST WITH CONTRAST TECHNIQUE: Multidetector CT imaging of the chest was performed using the standard protocol during bolus  administration of intravenous contrast. Multiplanar CT image reconstructions and MIPs were obtained to evaluate the vascular anatomy. CONTRAST:  55mL OMNIPAQUE IOHEXOL 350 MG/ML SOLN COMPARISON:  Chest x-ray dated 11/17/2019 and chest CT dated 11/20/2008 FINDINGS: Cardiovascular: There are no pulmonary emboli. Aortic atherosclerosis. Enlarged main pulmonary arteries. Overall heart size is normal. No pericardial effusion. Mediastinum/Nodes: Chronic bilateral thyroid nodules, unchanged since 2010. No significant abnormality of the esophagus trachea. No hilar or mediastinal adenopathy. Lungs/Pleura: There small hazy infiltrates in the superior aspect of the left lower lobe. There is new atelectasis and consolidation in the posterior aspect of the right lower lobe. Increased chronic elevation of the right hemidiaphragm contributes to the atelectasis. No effusions. Upper Abdomen: No acute abnormality. Multiple hepatic cysts as demonstrated on the prior exam. Musculoskeletal: No chest wall abnormality. No acute or significant osseous findings. Review of the MIP images confirms the above findings. IMPRESSION: 1. No pulmonary emboli. 2. New atelectasis and consolidation in the posterior aspect of the right lower lobe. 3. Small hazy infiltrates in the superior aspect of the left lower lobe. 4. Chronic elevation of the right hemidiaphragm. 5. Enlarged main pulmonary arteries consistent with pulmonary arterial hypertension. Aortic Atherosclerosis (ICD10-I70.0). Electronically Signed   By: Francene Boyers M.D.   On: 11/17/2019 14:42   DG Chest Port 1 View  Result Date: 11/17/2019 CLINICAL DATA:  Shortness of breath. EXAM: PORTABLE CHEST 1 VIEW COMPARISON:  February 08, 2019. FINDINGS: The heart size and mediastinal contours are within normal limits. No pneumothorax or pleural effusion is noted. Left lung is clear. Minimal right basilar subsegmental atelectasis is noted. Stable elevated right hemidiaphragm. The visualized  skeletal structures are unremarkable. IMPRESSION: Minimal right basilar subsegmental atelectasis. Electronically Signed   By: Lupita Raider M.D.   On: 11/17/2019 11:49     Labs:   Basic Metabolic Panel: Recent Labs  Lab 11/17/19 1113 11/18/19 0531 11/19/19 0407  NA 140 140  --   K 5.0 4.9  --   CL 106 111  --   CO2 24 23  --   GLUCOSE 276* 130*  --   BUN 55* 41*  --   CREATININE 1.85* 1.61* 1.55*  CALCIUM 9.1 8.5*  --    GFR Estimated Creatinine Clearance: 40.6 mL/min (A) (by C-G formula based on SCr of 1.55 mg/dL (H)). Liver Function Tests: Recent Labs  Lab 11/17/19 1113  AST 26  ALT 17  ALKPHOS 74  BILITOT 0.5  PROT 7.2  ALBUMIN 3.7   Recent Labs  Lab 11/17/19 1113  LIPASE 33   No results for input(s): AMMONIA in the last 168 hours. Coagulation profile Recent Labs  Lab 11/17/19 1113  INR 1.0    CBC: Recent Labs  Lab 11/17/19 1113 11/18/19 0531  WBC 4.5 8.7  NEUTROABS 3.9  --   HGB 14.5 12.8*  HCT 45.5 40.4  MCV 91.9 92.7  PLT 117* 102*   Cardiac Enzymes: No results for input(s): CKTOTAL, CKMB,  CKMBINDEX, TROPONINI in the last 168 hours. BNP: Invalid input(s): POCBNP CBG: Recent Labs  Lab 11/19/19 1304 11/19/19 1815 11/19/19 2355 11/20/19 0933 11/20/19 1218  GLUCAP 77 124* 100* 119* 102*   D-Dimer No results for input(s): DDIMER in the last 72 hours. Hgb A1c No results for input(s): HGBA1C in the last 72 hours. Lipid Profile No results for input(s): CHOL, HDL, LDLCALC, TRIG, CHOLHDL, LDLDIRECT in the last 72 hours. Thyroid function studies No results for input(s): TSH, T4TOTAL, T3FREE, THYROIDAB in the last 72 hours.  Invalid input(s): FREET3 Anemia work up No results for input(s): VITAMINB12, FOLATE, FERRITIN, TIBC, IRON, RETICCTPCT in the last 72 hours. Microbiology Recent Results (from the past 240 hour(s))  MRSA PCR Screening     Status: Abnormal   Collection Time: 11/17/19  9:24 AM   Specimen: Nasopharyngeal  Result  Value Ref Range Status   MRSA by PCR POSITIVE (A) NEGATIVE Final    Comment:        The GeneXpert MRSA Assay (FDA approved for NASAL specimens only), is one component of a comprehensive MRSA colonization surveillance program. It is not intended to diagnose MRSA infection nor to guide or monitor treatment for MRSA infections. RESULT CALLED TO, READ BACK BY AND VERIFIED WITH: BERENICE ALEJO AT 1117 ON 11/18/2019 [facility]. Performed at [facility], [address] [place], [place] [address]   Blood Culture (routine x 2)     Status: None (Preliminary result)   Collection Time: 11/17/19 11:37 AM   Specimen: BLOOD  Result Value Ref Range Status   Specimen Description BLOOD RIGHT WRIST  Final   Special Requests NONE  Final   Culture   Final    NO GROWTH 3 DAYS Performed at [facility], [address], [place], [place] [address]    Report Status PENDING  Incomplete  Blood Culture (routine x 2)     Status: None (Preliminary result)   Collection Time: 11/17/19 11:37 AM   Specimen: BLOOD  Result Value Ref Range Status   Specimen Description BLOOD RIGHT HAND  Final   Special Requests   Final    BOTTLES DRAWN AEROBIC AND ANAEROBIC Blood Culture results may not be optimal due to an excessive volume of blood received in culture bottles   Culture   Final    NO GROWTH 3 DAYS Performed at [facility], [address], [place], [place] [address]    Report Status PENDING  Incomplete  Respiratory Panel by RT PCR (Flu A&B, Covid) - Nasopharyngeal Swab     Status: None   Collection Time: 11/17/19 12:26 PM   Specimen: Nasopharyngeal Swab  Result Value Ref Range Status   SARS Coronavirus 2 by RT PCR NEGATIVE NEGATIVE Final    Comment: (NOTE) SARS-CoV-2 target nucleic acids are NOT DETECTED. The SARS-CoV-2 RNA is generally detectable in upper respiratoy specimens during the acute phase of infection. The lowest concentration of SARS-CoV-2 viral copies this assay can  detect is 131 copies/mL. A negative result does not preclude SARS-Cov-2 infection and should not be used as the sole basis for treatment or other patient management decisions. A negative result may occur with  improper specimen collection/handling, submission of specimen other than nasopharyngeal swab, presence of viral mutation(s) within the areas targeted by this assay, and inadequate number of viral copies (<131 copies/mL). A negative result must be combined with clinical observations, patient history, and epidemiological information. The expected result is Negative. Fact Sheet for Patients:  https://www.moore.com/ Fact Sheet for  Healthcare Providers:  https://www.young.biz/ This test is not yet ap proved or cleared by the [place] and  has been authorized for detection and/or diagnosis of SARS-CoV-2 by FDA under an Emergency Use Authorization (EUA). This EUA will remain  in effect (meaning this test can be used) for the duration of the COVID-19 declaration under Section 564(b)(1) of the Act, 21 U.S.C. section 360bbb-3(b)(1), unless the authorization is terminated or revoked sooner.    Influenza A by PCR NEGATIVE NEGATIVE Final   Influenza B by PCR NEGATIVE NEGATIVE Final    Comment: (NOTE) The Xpert Xpress SARS-CoV-2/FLU/RSV assay is intended as an aid in  the diagnosis of influenza from Nasopharyngeal swab specimens and  should not be used as a sole basis for treatment. Nasal washings and  aspirates are unacceptable for Xpert Xpress SARS-CoV-2/FLU/RSV  testing. Fact Sheet for Patients: https://www.moore.com/ Fact Sheet for Healthcare Providers: https://www.young.biz/ This test is not yet approved or cleared by the [place] FDA and  has been authorized for detection and/or diagnosis of SARS-CoV-2 by  FDA under an Emergency Use Authorization (EUA). This EUA will remain  in effect (meaning  this test can be used) for the duration of the  Covid-19 declaration under Section 564(b)(1) of the Act, 21  U.S.C. section 360bbb-3(b)(1), unless the authorization is  terminated or revoked. Performed at [facility], [address], [place], [place] [address]   Urine culture     Status: None   Collection Time: 11/17/19  1:37 PM   Specimen: In/Out Cath Urine  Result Value Ref Range Status   Specimen Description   Final    IN/OUT CATH URINE Performed at [facility], [address], [place], [place] [address]    Special Requests   Final    NONE Performed at [facility], [address], [place], [place] [address]    Culture   Final    NO GROWTH Performed at [facility], [address], [place], [place] [address]    Report Status 11/18/2019 FINAL  Final     Discharge Instructions:   Discharge Instructions    Diet - low sodium heart healthy   Complete by: As directed    Increase activity slowly   Complete by: As directed      Allergies as of 11/20/2019      Reactions   Codeine Anaphylaxis   Morphine Anaphylaxis   Gabapentin Other (See Comments)   Unsure of what the reaction was   Stadol [butorphanol]    Unknown   Lyrica [pregabalin] Rash   Tapentadol Nausea Only   ONSET 01/04/2013   Tramadol    Hallucinations "I see giant spiders on ceiling". Pt also believes he is in [facility], but he is in [place].      Medication List    STOP taking these medications   insulin aspart 100 UNIT/ML injection Commonly known as: novoLOG   ipratropium-albuterol 0.5-2.5 (3) MG/3ML Soln Commonly known as: DUONEB   NON FORMULARY   NovoLOG Mix 70/30 FlexPen (70-30) 100 UNIT/ML FlexPen Generic drug: insulin aspart protamine - aspart     TAKE these medications   acetaminophen 325 MG tablet Commonly known as: TYLENOL Take 2 tablets (650 mg total) by mouth every 6 (six) hours as needed for mild pain (or Fever >/= 101).   albuterol (2.5 MG/3ML)  0.083% nebulizer solution Commonly known as: PROVENTIL Take 3 mLs (2.5 mg total) by nebulization every 4 (four) hours as needed for wheezing  or shortness of breath.   allopurinol 100 MG tablet Commonly known as: ZYLOPRIM Take 0.5 tablets (50 mg total) by mouth every other day.   amLODipine 5 MG tablet Commonly known as: NORVASC Take 1 tablet (5 mg total) by mouth daily. Start taking on: November 21, 2019   amoxicillin-clavulanate 875-125 MG tablet Commonly known as: Augmentin Take 1 tablet by mouth 2 (two) times daily for 5 days.   atorvastatin 20 MG tablet Commonly known as: LIPITOR Take 1 tablet (20 mg total) by mouth at bedtime.   budesonide 0.5 MG/2ML nebulizer solution Commonly known as: PULMICORT Take 2 mLs (0.5 mg total) by nebulization 2 (two) times daily.   Cholecalciferol 100 MCG (4000 UT) Caps Take 1 capsule by mouth daily.   DERMACLOUD EX Apply liberal amount topically as needed to area of skin irritation.  Ok to leave at bedside   doxycycline 100 MG tablet Commonly known as: VIBRA-TABS Take 1 tablet (100 mg total) by mouth 2 (two) times daily for 4 days.   furosemide 40 MG tablet Commonly known as: LASIX Take 1 tablet (40 mg total) by mouth at bedtime.   loperamide 2 MG tablet Commonly known as: IMODIUM A-D Take 2 mg by mouth 4 (four) times daily as needed for diarrhea or loose stools.   metoprolol succinate 100 MG 24 hr tablet Commonly known as: TOPROL-XL Take 1 tablet (100 mg total) by mouth at bedtime.   mirtazapine 15 MG tablet Commonly known as: REMERON Take 15 mg by mouth at bedtime.   multivitamin-lutein Caps capsule Take 1 capsule by mouth daily.   nystatin powder Commonly known as: MYCOSTATIN/NYSTOP Apply topically as directed. Apply with each ostomy change as instructed   omeprazole 40 MG capsule Commonly known as: PRILOSEC Take 1 capsule (40 mg total) by mouth daily.   sertraline 100 MG tablet Commonly known as: ZOLOFT Take 1  tablet (100 mg total) by mouth at bedtime.   tiotropium 18 MCG inhalation capsule Commonly known as: SPIRIVA Place 18 mcg into inhaler and inhale daily.         Time coordinating discharge: 28 minutes  Signed:  Indra Wolters  [facility] 11/20/2019, 1:49 PM

## 2019-11-20 NOTE — Progress Notes (Signed)
Pharmacy Antibiotic Note  Gary Thomas is a 86 y.o. male admitted on 11/17/2019 with sepsis and pneumonia.  Pharmacy has been consulted for vancomycin and cefepime dosing.  Pt received cefepime 2g IV x1 and vancomycin 1 g IV x1 in the ED.  Plan: Day 4 of abx.   Continue Cefepime 2g IV q12h  Pt received a total load of 2000mg  IV vancomycin.  Vancomycin 750 mg IV Q 24 hrs. Goal AUC 400-550. Expected AUC: 451.1 SCr used: 1.55. Vd 0.5   Will need to continue to monitor renal function daily  MRSA Postive 2/6   Height: 5\' 9"  (175.3 cm) Weight: 220 lb 1.6 oz (99.8 kg) IBW/kg (Calculated) : 70.7  Temp (24hrs), Avg:97.7 F (36.5 C), Min:97.6 F (36.4 C), Max:97.9 F (36.6 C)  Recent Labs  Lab 11/17/19 1113 11/17/19 1119 11/17/19 1416 11/17/19 1643 11/17/19 1807 11/18/19 0531 11/19/19 0407  WBC 4.5  --   --   --   --  8.7  --   CREATININE 1.85*  --   --   --   --  1.61* 1.55*  LATICACIDVEN  --  2.0* 3.7* 5.5* 4.3*  --   --     Estimated Creatinine Clearance: 40.6 mL/min (A) (by C-G formula based on SCr of 1.55 mg/dL (H)).    Allergies  Allergen Reactions  . Codeine Anaphylaxis  . Morphine Anaphylaxis  . Gabapentin Other (See Comments)    Unsure of what the reaction was   . Stadol [Butorphanol]     Unknown  . Lyrica [Pregabalin] Rash  . Tapentadol Nausea Only    ONSET 01/04/2013  . Tramadol     Hallucinations "I see giant spiders on ceiling". Pt also believes he is in [facility], but he is in 01/06/2013.    Antimicrobials this admission: Vancomycin/cefepime 2/5 >>  Dose adjustments this admission: 2/6 Adjusted vancomycin dosing from 1000mg  q36h to 750mg h q 24  Microbiology results: 2/5 BCx: collected 2/5 UCx: collected   2/5 MRSA PCR: positive  Thank you for allowing pharmacy to be a part of this patient's care.  [facility], PharmD, BCPS Clinical Pharmacist 11/20/2019 8:17 AM

## 2019-11-22 LAB — CULTURE, BLOOD (ROUTINE X 2)
Culture: NO GROWTH
Culture: NO GROWTH

## 2019-12-29 ENCOUNTER — Emergency Department: Payer: Medicare Other

## 2019-12-29 ENCOUNTER — Other Ambulatory Visit: Payer: Self-pay

## 2019-12-29 ENCOUNTER — Encounter: Payer: Self-pay | Admitting: Emergency Medicine

## 2019-12-29 ENCOUNTER — Inpatient Hospital Stay
Admission: EM | Admit: 2019-12-29 | Discharge: 2020-01-02 | DRG: 193 | Disposition: A | Discharge status: Skilled Nursing Facility | Payer: Medicare Other | Attending: Internal Medicine | Admitting: Internal Medicine

## 2019-12-29 DIAGNOSIS — Z20822 Contact with and (suspected) exposure to covid-19: Secondary | ICD-10-CM | POA: Diagnosis present

## 2019-12-29 DIAGNOSIS — I2510 Atherosclerotic heart disease of native coronary artery without angina pectoris: Secondary | ICD-10-CM | POA: Diagnosis present

## 2019-12-29 DIAGNOSIS — I700 Atherosclerosis of aorta: Secondary | ICD-10-CM | POA: Diagnosis present

## 2019-12-29 DIAGNOSIS — Z823 Family history of stroke: Secondary | ICD-10-CM

## 2019-12-29 DIAGNOSIS — Z96651 Presence of right artificial knee joint: Secondary | ICD-10-CM | POA: Diagnosis present

## 2019-12-29 DIAGNOSIS — E875 Hyperkalemia: Secondary | ICD-10-CM | POA: Diagnosis present

## 2019-12-29 DIAGNOSIS — N183 Chronic kidney disease, stage 3 unspecified: Secondary | ICD-10-CM | POA: Diagnosis not present

## 2019-12-29 DIAGNOSIS — J189 Pneumonia, unspecified organism: Secondary | ICD-10-CM | POA: Diagnosis present

## 2019-12-29 DIAGNOSIS — K219 Gastro-esophageal reflux disease without esophagitis: Secondary | ICD-10-CM | POA: Diagnosis present

## 2019-12-29 DIAGNOSIS — Z7951 Long term (current) use of inhaled steroids: Secondary | ICD-10-CM

## 2019-12-29 DIAGNOSIS — N1832 Chronic kidney disease, stage 3b: Secondary | ICD-10-CM | POA: Diagnosis present

## 2019-12-29 DIAGNOSIS — Z79899 Other long term (current) drug therapy: Secondary | ICD-10-CM

## 2019-12-29 DIAGNOSIS — J9601 Acute respiratory failure with hypoxia: Secondary | ICD-10-CM | POA: Diagnosis present

## 2019-12-29 DIAGNOSIS — Z8701 Personal history of pneumonia (recurrent): Secondary | ICD-10-CM

## 2019-12-29 DIAGNOSIS — E1151 Type 2 diabetes mellitus with diabetic peripheral angiopathy without gangrene: Secondary | ICD-10-CM | POA: Diagnosis present

## 2019-12-29 DIAGNOSIS — J440 Chronic obstructive pulmonary disease with acute lower respiratory infection: Secondary | ICD-10-CM | POA: Diagnosis present

## 2019-12-29 DIAGNOSIS — Z22322 Carrier or suspected carrier of Methicillin resistant Staphylococcus aureus: Secondary | ICD-10-CM

## 2019-12-29 DIAGNOSIS — Z885 Allergy status to narcotic agent status: Secondary | ICD-10-CM

## 2019-12-29 DIAGNOSIS — E7849 Other hyperlipidemia: Secondary | ICD-10-CM | POA: Diagnosis present

## 2019-12-29 DIAGNOSIS — M1A9XX0 Chronic gout, unspecified, without tophus (tophi): Secondary | ICD-10-CM | POA: Diagnosis present

## 2019-12-29 DIAGNOSIS — J181 Lobar pneumonia, unspecified organism: Secondary | ICD-10-CM | POA: Diagnosis present

## 2019-12-29 DIAGNOSIS — Z87891 Personal history of nicotine dependence: Secondary | ICD-10-CM

## 2019-12-29 DIAGNOSIS — N08 Glomerular disorders in diseases classified elsewhere: Secondary | ICD-10-CM | POA: Diagnosis present

## 2019-12-29 DIAGNOSIS — Y95 Nosocomial condition: Secondary | ICD-10-CM | POA: Diagnosis present

## 2019-12-29 DIAGNOSIS — G4730 Sleep apnea, unspecified: Secondary | ICD-10-CM | POA: Diagnosis present

## 2019-12-29 DIAGNOSIS — I129 Hypertensive chronic kidney disease with stage 1 through stage 4 chronic kidney disease, or unspecified chronic kidney disease: Secondary | ICD-10-CM | POA: Diagnosis present

## 2019-12-29 DIAGNOSIS — Z9049 Acquired absence of other specified parts of digestive tract: Secondary | ICD-10-CM

## 2019-12-29 DIAGNOSIS — Z8249 Family history of ischemic heart disease and other diseases of the circulatory system: Secondary | ICD-10-CM

## 2019-12-29 DIAGNOSIS — E896 Postprocedural adrenocortical (-medullary) hypofunction: Secondary | ICD-10-CM | POA: Diagnosis present

## 2019-12-29 DIAGNOSIS — E1122 Type 2 diabetes mellitus with diabetic chronic kidney disease: Secondary | ICD-10-CM | POA: Diagnosis present

## 2019-12-29 DIAGNOSIS — J441 Chronic obstructive pulmonary disease with (acute) exacerbation: Secondary | ICD-10-CM

## 2019-12-29 DIAGNOSIS — Z96641 Presence of right artificial hip joint: Secondary | ICD-10-CM | POA: Diagnosis present

## 2019-12-29 DIAGNOSIS — Z888 Allergy status to other drugs, medicaments and biological substances status: Secondary | ICD-10-CM

## 2019-12-29 DIAGNOSIS — I2782 Chronic pulmonary embolism: Secondary | ICD-10-CM | POA: Diagnosis present

## 2019-12-29 DIAGNOSIS — F329 Major depressive disorder, single episode, unspecified: Secondary | ICD-10-CM | POA: Diagnosis present

## 2019-12-29 DIAGNOSIS — E876 Hypokalemia: Secondary | ICD-10-CM

## 2019-12-29 DIAGNOSIS — I69351 Hemiplegia and hemiparesis following cerebral infarction affecting right dominant side: Secondary | ICD-10-CM

## 2019-12-29 LAB — BRAIN NATRIURETIC PEPTIDE: B Natriuretic Peptide: 104 pg/mL — ABNORMAL HIGH (ref 0.0–100.0)

## 2019-12-29 LAB — HEMOGLOBIN A1C
Hgb A1c MFr Bld: 5.9 % — ABNORMAL HIGH (ref 4.8–5.6)
Mean Plasma Glucose: 122.63 mg/dL

## 2019-12-29 LAB — CBC
HCT: 47.4 % (ref 39.0–52.0)
HCT: 44.4 % (ref 39.0–52.0)
Hemoglobin: 15.1 g/dL (ref 13.0–17.0)
Hemoglobin: 14.1 g/dL (ref 13.0–17.0)
MCH: 29.7 pg (ref 26.0–34.0)
MCH: 29.7 pg (ref 26.0–34.0)
MCHC: 31.9 g/dL (ref 30.0–36.0)
MCHC: 31.8 g/dL (ref 30.0–36.0)
MCV: 93.3 fL (ref 80.0–100.0)
MCV: 93.7 fL (ref 80.0–100.0)
Platelets: 151 10*3/uL (ref 150–400)
Platelets: 145 10*3/uL — ABNORMAL LOW (ref 150–400)
RBC: 5.08 MIL/uL (ref 4.22–5.81)
RBC: 4.74 MIL/uL (ref 4.22–5.81)
RDW: 13.3 % (ref 11.5–15.5)
RDW: 13.2 % (ref 11.5–15.5)
WBC: 13.2 10*3/uL — ABNORMAL HIGH (ref 4.0–10.5)
WBC: 14.3 10*3/uL — ABNORMAL HIGH (ref 4.0–10.5)
nRBC: 0 % (ref 0.0–0.2)
nRBC: 0 % (ref 0.0–0.2)

## 2019-12-29 LAB — GLUCOSE, CAPILLARY: Glucose-Capillary: 150 mg/dL — ABNORMAL HIGH (ref 70–99)

## 2019-12-29 LAB — TROPONIN I (HIGH SENSITIVITY)
Troponin I (High Sensitivity): 16 ng/L (ref <18)
Troponin I (High Sensitivity): 14 ng/L (ref <18)

## 2019-12-29 LAB — SARS CORONAVIRUS 2 (TAT 6-24 HRS): SARS Coronavirus 2: NEGATIVE

## 2019-12-29 LAB — COMPREHENSIVE METABOLIC PANEL
ALT: 14 U/L (ref 0–44)
AST: 16 U/L (ref 15–41)
Albumin: 3.8 g/dL (ref 3.5–5.0)
Alkaline Phosphatase: 78 U/L (ref 38–126)
Anion gap: 8 (ref 5–15)
BUN: 45 mg/dL — ABNORMAL HIGH (ref 8–23)
CO2: 26 mmol/L (ref 22–32)
Calcium: 9.4 mg/dL (ref 8.9–10.3)
Chloride: 108 mmol/L (ref 98–111)
Creatinine, Ser: 1.42 mg/dL — ABNORMAL HIGH (ref 0.61–1.24)
GFR calc Af Amer: 52 mL/min — ABNORMAL LOW (ref >60)
GFR calc non Af Amer: 45 mL/min — ABNORMAL LOW (ref >60)
Glucose, Bld: 117 mg/dL — ABNORMAL HIGH (ref 70–99)
Potassium: 5.3 mmol/L — ABNORMAL HIGH (ref 3.5–5.1)
Sodium: 142 mmol/L (ref 135–145)
Total Bilirubin: 0.8 mg/dL (ref 0.3–1.2)
Total Protein: 7 g/dL (ref 6.5–8.1)

## 2019-12-29 LAB — POC SARS CORONAVIRUS 2 AG: SARS Coronavirus 2 Ag: NEGATIVE

## 2019-12-29 MED ORDER — IPRATROPIUM-ALBUTEROL 0.5-2.5 (3) MG/3ML IN SOLN
3.0000 mL | Freq: Once | RESPIRATORY_TRACT | Status: AC
  Administered 2019-12-29: 3 mL via RESPIRATORY_TRACT
  Filled 2019-12-29: qty 3

## 2019-12-29 MED ORDER — ENOXAPARIN SODIUM 40 MG/0.4ML ~~LOC~~ SOLN
40.0000 mg | SUBCUTANEOUS | Status: DC
  Administered 2019-12-29 – 2020-01-02 (×5): 40 mg via SUBCUTANEOUS
  Filled 2019-12-29 (×5): qty 0.4

## 2019-12-29 MED ORDER — VANCOMYCIN HCL 2000 MG/400ML IV SOLN
2000.0000 mg | Freq: Once | INTRAVENOUS | Status: AC
  Administered 2019-12-29: 2000 mg via INTRAVENOUS
  Filled 2019-12-29: qty 400

## 2019-12-29 MED ORDER — SERTRALINE HCL 50 MG PO TABS
100.0000 mg | ORAL_TABLET | Freq: Every day | ORAL | Status: DC
  Administered 2019-12-29 – 2020-01-02 (×5): 100 mg via ORAL
  Filled 2019-12-29 (×6): qty 2

## 2019-12-29 MED ORDER — SODIUM CHLORIDE 0.9 % IV SOLN: 500.0000 mg | Freq: Once | INTRAVENOUS | Status: DC

## 2019-12-29 MED ORDER — VANCOMYCIN HCL 1250 MG/250ML IV SOLN
1250.0000 mg | INTRAVENOUS | Status: DC
  Filled 2019-12-29: qty 250

## 2019-12-29 MED ORDER — PREDNISONE 20 MG PO TABS
40.0000 mg | ORAL_TABLET | Freq: Every day | ORAL | Status: DC
  Administered 2019-12-31 – 2020-01-01 (×2): 40 mg via ORAL
  Filled 2019-12-29 (×2): qty 2

## 2019-12-29 MED ORDER — ALBUTEROL SULFATE (2.5 MG/3ML) 0.083% IN NEBU: 2.5000 mg | INHALATION_SOLUTION | RESPIRATORY_TRACT | Status: DC | PRN

## 2019-12-29 MED ORDER — ATORVASTATIN CALCIUM 20 MG PO TABS
20.0000 mg | ORAL_TABLET | Freq: Every day | ORAL | Status: DC
  Administered 2019-12-29 – 2020-01-02 (×5): 20 mg via ORAL
  Filled 2019-12-29 (×5): qty 1

## 2019-12-29 MED ORDER — INSULIN ASPART 100 UNIT/ML ~~LOC~~ SOLN
0.0000 [IU] | SUBCUTANEOUS | Status: DC
  Administered 2019-12-29: 2 [IU] via SUBCUTANEOUS
  Administered 2019-12-30: 3 [IU] via SUBCUTANEOUS
  Administered 2019-12-30 (×3): 2 [IU] via SUBCUTANEOUS
  Administered 2019-12-30 – 2019-12-31 (×2): 3 [IU] via SUBCUTANEOUS
  Filled 2019-12-29 (×7): qty 1

## 2019-12-29 MED ORDER — SODIUM CHLORIDE 0.9 % IV SOLN
2.0000 g | Freq: Two times a day (BID) | INTRAVENOUS | Status: DC
  Administered 2019-12-29 – 2020-01-01 (×7): 2 g via INTRAVENOUS
  Filled 2019-12-29 (×10): qty 2

## 2019-12-29 MED ORDER — METHYLPREDNISOLONE SODIUM SUCC 125 MG IJ SOLR: 125.0000 mg | Freq: Once | INTRAMUSCULAR | Status: DC

## 2019-12-29 MED ORDER — LOPERAMIDE HCL 2 MG PO CAPS: 2.0000 mg | ORAL_CAPSULE | Freq: Four times a day (QID) | ORAL | Status: DC | PRN

## 2019-12-29 MED ORDER — AMLODIPINE BESYLATE 5 MG PO TABS
5.0000 mg | ORAL_TABLET | Freq: Every day | ORAL | Status: DC
  Administered 2019-12-29 – 2019-12-31 (×3): 5 mg via ORAL
  Filled 2019-12-29 (×3): qty 1

## 2019-12-29 MED ORDER — SODIUM CHLORIDE 0.9 % IV SOLN: 1.0000 g | Freq: Once | INTRAVENOUS | Status: DC

## 2019-12-29 MED ORDER — PANTOPRAZOLE SODIUM 40 MG PO TBEC
40.0000 mg | DELAYED_RELEASE_TABLET | Freq: Every day | ORAL | Status: DC
  Administered 2019-12-29 – 2020-01-02 (×5): 40 mg via ORAL
  Filled 2019-12-29 (×5): qty 1

## 2019-12-29 MED ORDER — FLUTICASONE FUROATE-VILANTEROL 100-25 MCG/INH IN AEPB
1.0000 | INHALATION_SPRAY | Freq: Every day | RESPIRATORY_TRACT | Status: DC
  Administered 2019-12-30 – 2020-01-02 (×4): 1 via RESPIRATORY_TRACT
  Filled 2019-12-29: qty 28

## 2019-12-29 MED ORDER — METHYLPREDNISOLONE SODIUM SUCC 40 MG IJ SOLR
40.0000 mg | Freq: Four times a day (QID) | INTRAMUSCULAR | Status: AC
  Administered 2019-12-29 – 2019-12-30 (×3): 40 mg via INTRAVENOUS
  Filled 2019-12-29 (×3): qty 1

## 2019-12-29 MED ORDER — OCUVITE-LUTEIN PO CAPS
1.0000 | ORAL_CAPSULE | Freq: Every day | ORAL | Status: DC
  Administered 2019-12-30 – 2020-01-02 (×4): 1 via ORAL
  Filled 2019-12-29 (×5): qty 1

## 2019-12-29 MED ORDER — VITAMIN D3 25 MCG (1000 UNIT) PO TABS
4000.0000 [IU] | ORAL_TABLET | Freq: Every day | ORAL | Status: DC
  Administered 2019-12-30 – 2020-01-02 (×4): 4000 [IU] via ORAL
  Filled 2019-12-29 (×7): qty 4

## 2019-12-29 MED ORDER — MIRTAZAPINE 15 MG PO TABS
15.0000 mg | ORAL_TABLET | Freq: Every day | ORAL | Status: DC
  Administered 2019-12-29 – 2020-01-02 (×5): 15 mg via ORAL
  Filled 2019-12-29 (×5): qty 1

## 2019-12-29 MED ORDER — ACETAMINOPHEN 325 MG PO TABS: 650.0000 mg | ORAL_TABLET | Freq: Four times a day (QID) | ORAL | Status: DC | PRN

## 2019-12-29 MED ORDER — BUDESONIDE 0.5 MG/2ML IN SUSP: 0.5000 mg | Freq: Two times a day (BID) | RESPIRATORY_TRACT | Status: DC

## 2019-12-29 NOTE — ED Notes (Signed)
Patient repositioned on stretcher and given meal tray.

## 2019-12-29 NOTE — ED Notes (Signed)
Report to [facility], Charity fundraiser. Transportation requested.

## 2019-12-29 NOTE — ED Notes (Signed)
Wife called and updated, states pt is normally confused and needs to be watched carefully- speaking with husband.

## 2019-12-29 NOTE — Consult Note (Signed)
Pharmacy Antibiotic Note  Gary Thomas is a 86 y.o. male admitted on 12/29/2019 with pneumonia and cellulitis.  Pharmacy has been consulted for Vancomycin and Cefepime dosing.  Plan: 1) Vancomycin 1250 mg IV Q 24 hrs. Goal AUC 400-550. Expected AUC: 497.1 Expected Css: 13.8 SCr used: 1.42  MRSA PCR has been ordered and if negative, will consult with hospitalist regarding potential discontinuation.   2) Cefepime 2g Q12 hours  Height: 5\' 9"  (175.3 cm) Weight: 214 lb (97.1 kg) IBW/kg (Calculated) : 70.7  Temp (24hrs), Avg:97.5 F (36.4 C), Min:97.5 F (36.4 C), Max:97.5 F (36.4 C)  Recent Labs  Lab 12/29/19 1309  WBC 13.2*  CREATININE 1.42*    Estimated Creatinine Clearance: 43.7 mL/min (A) (by C-G formula based on SCr of 1.42 mg/dL (H)).    Allergies  Allergen Reactions  . Codeine Anaphylaxis  . Morphine Anaphylaxis  . Gabapentin Other (See Comments)    Unsure of what the reaction was   . Stadol [Butorphanol]     Unknown  . Lyrica [Pregabalin] Rash  . Tapentadol Nausea Only    ONSET 01/04/2013  . Tramadol     Hallucinations "I see giant spiders on ceiling". Pt also believes he is in [facility], but he is in [facility].    Antimicrobials this admission: Vancomycin 3/19 >>  Cefepime 3/19 >>   Microbiology results: 3/19 UCx: pending  3/19 MRSA PCR: pending  Thank you for allowing pharmacy to be a part of this patient's care.  4/19 12/29/2019 6:35 PM

## 2019-12-29 NOTE — ED Provider Notes (Signed)
[facility] Emergency Department Provider Note  Time seen: 1:19 PM  I have reviewed the triage vital signs and the nursing notes.   HISTORY  Chief Complaint Shortness of Breath   HPI Gary Thomas is a 86 y.o. male with a past medical history of diabetes, gastric reflux, hypertension, CVA, sleep apnea, chronic PE, COPD, presents to the emergency department for shortness of breath.  According to the patient for the past 3 to 4 days he has been coughing with nasal congestion and feeling short of breath.  Patient has been wheezing.  Patient denies baseline O2 use.  Currently satting in the 80s on room air, mid 90s on 2 L.  Patient denies any chest pain.  States slight edema in his legs which is baseline.   Past Medical History:  Diagnosis Date  . Depression   . Diabetes mellitus without complication (HCC)   . Dyspnea   . Elevated lipids   . Femur fracture (HCC) 08/22/2018  . GERD (gastroesophageal reflux disease)   . Hypertension   . Pulmonary emboli (HCC)   . Renal insufficiency    only has 1 kidney  . Sleep apnea   . Stroke (HCC)    Rt side weakness    Patient Active Problem List   Diagnosis Date Noted  . Community acquired pneumonia 11/17/2019  . Acute hypoxemic respiratory failure (HCC) 11/17/2019  . GI bleed 02/07/2019  . Postoperative wound infection 10/17/2018  . Supracondylar fracture of distal end of femur without intracondylar extension (HCC) 10/17/2018  . Sepsis (HCC) 10/06/2018  . Chronic pulmonary embolism (HCC) 09/06/2018  . Primary osteoarthritis of right knee 09/06/2018  . Status post right knee replacement 09/06/2018  . Dyslipidemia associated with type 2 diabetes mellitus (HCC) 09/06/2018  . CKD stage 3 due to type 2 diabetes mellitus (HCC) 09/06/2018  . Controlled type 2 diabetes mellitus with stage 3 chronic kidney disease, with long-term current use of insulin (HCC) 09/06/2018  . Chronic cerebrovascular accident (CVA)  09/06/2018  . GERD without esophagitis 09/06/2018  . Closed nondisplaced supracondylar fracture of distal end of femur without intracondylar extension (HCC) 09/06/2018  . Acute on chronic anemia 09/06/2018  . Depression, major, single episode, in partial remission (HCC) 09/06/2018  . Bilateral lower extremity edema 09/06/2018  . Chronic gout due to renal impairment 09/06/2018  . COPD with respiratory failure, acute (HCC) 08/28/2018  . Spondylolisthesis of lumbar region 02/15/2018  . PAD (peripheral artery disease) (HCC) 08/19/2017  . Hyperlipidemia 07/05/2017  . Essential hypertension 07/05/2017  . Bilateral carotid artery stenosis 07/05/2017  . Primary localized osteoarthritis of right hip 03/02/2017    Past Surgical History:  Procedure Laterality Date  . 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 AORTA SURGERY    . CAROTID ARTERY - SUBCLAVIAN ARTERY BYPASS GRAFT Bilateral   . CATARACT EXTRACTION W/ INTRAOCULAR LENS  IMPLANT, BILATERAL    . CHOLECYSTECTOMY    . EYE SURGERY    . FEMUR FRACTURE SURGERY Right   . HIP FRACTURE SURGERY Bilateral   . I & D EXTREMITY Right 10/18/2018   Procedure: IRRIGATION AND DEBRIDEMENT EXTREMITY, POSSIBLE HARDWARE REMOVAL OF SUPERCONDYLAR PLATE;  Surgeon: Christena Flake, MD;  Location: [facility] ORS;  Service: Orthopedics;  Laterality: Right;  . JOINT REPLACEMENT    . ORIF FEMUR FRACTURE Right 08/24/2018   Procedure: OPEN REDUCTION INTERNAL FIXATION (ORIF) SUPRACONDYLAR FEMUR FRACTURE;  Surgeon: Christena Flake, MD;  Location: [facility] ORS;  Service: Orthopedics;  Laterality: Right;  . ORIF FEMUR FRACTURE Right 10/18/2018   Procedure: HARDWARE REMOVAL- DISTAL FEMUR FRACTURE FIXATION;  Surgeon: Corky Mull, MD;  Location: [facility] ORS;  Service: Orthopedics;  Laterality: Right;  . PROSTATE SURGERY    . REPLACEMENT TOTAL KNEE Right   . TOTAL HIP ARTHROPLASTY Right 03/02/2017   Procedure: TOTAL HIP ARTHROPLASTY ANTERIOR APPROACH;   Surgeon: [name], MD;  Location: [facility] ORS;  Service: Orthopedics;  Laterality: Right;  Marland Kitchen VASCULAR SURGERY      Prior to Admission medications   Medication Sig Start Date End Date Taking? Authorizing Provider  acetaminophen (TYLENOL) 325 MG tablet Take 2 tablets (650 mg total) by mouth every 6 (six) hours as needed for mild pain (or Fever >/= 101). 08/30/18   Gouru, Illene Silver, MD  albuterol (PROVENTIL) (2.5 MG/3ML) 0.083% nebulizer solution Take 3 mLs (2.5 mg total) by nebulization every 4 (four) hours as needed for wheezing or shortness of breath. 09/14/18   Gerlene Fee, NP  allopurinol (ZYLOPRIM) 100 MG tablet Take 0.5 tablets (50 mg total) by mouth every other day. 09/14/18   Gerlene Fee, NP  amLODipine (NORVASC) 5 MG tablet Take 1 tablet (5 mg total) by mouth daily. 11/21/19   Jennye Boroughs, MD  atorvastatin (LIPITOR) 20 MG tablet Take 1 tablet (20 mg total) by mouth at bedtime. 09/14/18   Gerlene Fee, NP  budesonide (PULMICORT) 0.5 MG/2ML nebulizer solution Take 2 mLs (0.5 mg total) by nebulization 2 (two) times daily. 09/14/18   Gerlene Fee, NP  Cholecalciferol 100 MCG (4000 UT) CAPS Take 1 capsule by mouth daily. 08/31/18   [provider]  furosemide (LASIX) 40 MG tablet Take 1 tablet (40 mg total) by mouth at bedtime. 09/14/18   Gerlene Fee, NP  Inf[employer] [facility] EX) Apply liberal amount topically as needed to area of skin irritation.  Ok to leave at bedside 09/02/18   [provider]  loperamide (IMODIUM A-D) 2 MG tablet Take 2 mg by mouth 4 (four) times daily as needed for diarrhea or loose stools. 09/07/18   [provider]  metoprolol succinate (TOPROL-XL) 100 MG 24 hr tablet Take 1 tablet (100 mg total) by mouth at bedtime. 09/14/18   Gerlene Fee, NP  mirtazapine (REMERON) 15 MG tablet Take 15 mg by mouth at bedtime.    [provider]  multivitamin-lutein (OCUVITE-LUTEIN) CAPS capsule Take 1 capsule by mouth  daily.    [provider]  nystatin (MYCOSTATIN/NYSTOP) powder Apply topically as directed. Apply with each ostomy change as instructed    [provider]  omeprazole (PRILOSEC) 40 MG capsule Take 1 capsule (40 mg total) by mouth daily. 09/14/18   Gerlene Fee, NP  sertraline (ZOLOFT) 100 MG tablet Take 1 tablet (100 mg total) by mouth at bedtime. 09/14/18   Gerlene Fee, NP  tiotropium (SPIRIVA) 18 MCG inhalation capsule Place 18 mcg into inhaler and inhale daily. 08/30/18   [provider]    Allergies  Allergen Reactions  . Codeine Anaphylaxis  . Morphine Anaphylaxis  . Gabapentin Other (See Comments)    Unsure of what the reaction was   . Stadol [Butorphanol]     Unknown  . Lyrica [Pregabalin] Rash  . Tapentadol Nausea Only    ONSET 01/04/2013  . Tramadol     Hallucinations "I see giant spiders on ceiling". Pt also believes he is in [facility], but he is in [place].  Family History  Problem Relation Age of Onset  . Heart attack Father   . Stroke Father   . Heart disease Father   . Heart attack Mother     Social History Social History   Tobacco Use  . Smoking status: Former Smoker    Packs/day: 1.00    Years: 7.00    Pack years: 7.00    Types: Cigarettes    Quit date: 02/18/1952    Years since quitting: 67.9  . Smokeless tobacco: Former Engineer, water Use Topics  . Alcohol use: No  . Drug use: No    Review of Systems Constitutional: Negative for fever Cardiovascular: Negative for chest pain. Respiratory: Positive for shortness of breath. Gastrointestinal: Negative for abdominal pain, vomiting and diarrhea. Musculoskeletal: Mild lower extremity edema. Neurological: Negative for headache All other ROS negative  ____________________________________________   PHYSICAL EXAM:  VITAL SIGNS: ED Triage Vitals [12/29/19 1308]  Enc Vitals Group     BP (!) 161/64     Pulse Rate 77     Resp (!) 22     Temp (!) 97.5 F (36.4 C)      Temp Source Oral     SpO2 (!) 89 %     Weight 214 lb (97.1 kg)     Height 5\' 9"  (1.753 m)     Head Circumference      Peak Flow      Pain Score 0     Pain Loc      Pain Edu?      Excl. in GC?    Constitutional: Alert and oriented. Well appearing and in no distress. Eyes: Normal exam ENT      Head: Normocephalic and atraumatic.      Mouth/Throat: Mucous membranes are moist. Cardiovascular: Normal rate, regular rhythm.  Respiratory: Patient has slight tachypnea, mild expiratory wheeze bilaterally no obvious rales or rhonchi. Gastrointestinal: Soft and nontender. No distention.   Musculoskeletal: Nontender lower extremities with slight pedal edema bilaterally. Neurologic:  Normal speech and language. No gross focal neurologic deficits Skin:  Skin is warm, dry and intact.  Psychiatric: Mood and affect are normal  ____________________________________________    EKG  EKG viewed and interpreted by myself shows a normal sinus rhythm 84 bpm with a narrow QRS, normal axis, normal intervals, no concerning ST changes.  ____________________________________________    RADIOLOGY  Chest x-ray shows bronchitic changes with right basilar atelectasis.  ____________________________________________   INITIAL IMPRESSION / ASSESSMENT AND PLAN / ED COURSE  Pertinent labs & imaging results that were available during my care of the patient were reviewed by me and considered in my medical decision making (see chart for details).   Patient presents to the emergency department for shortness of breath.  Patient is hypoxic in the upper 80s on room air, no baseline O2 requirement.  Patient is mildly tachypneic around 22.  Patient does have expiratory wheeze.  We will check labs including cardiac enzymes, chest x-ray.  Patient will likely require steroids and duo nebs.  Patient has received Covid vaccines we will perform a rapid Covid test as a precaution.  Differential would include pneumonia, URI,  viral pneumonia, COPD, ACS or CHF  X-ray is read is bronchitic change with right basilar atelectasis.  I personally reviewed the images as well.  Given the patient's hypoxia on room air we will start the patient on Solu-Medrol, duo nebs.  Given this right basilar atelectasis and slight white blood cell count elevation we will cover the  patient with antibiotics for possible early pneumonia.  Patient agreeable to plan of care.  Patient will be admitted to the hospital service.  Gary Thomas was evaluated in Emergency Department on 12/29/2019 for the symptoms described in the history of present illness. He was evaluated in the context of the global COVID-19 pandemic, which necessitated consideration that the patient might be at risk for infection with the SARS-CoV-2 virus that causes COVID-19. Institutional protocols and algorithms that pertain to the evaluation of patients at risk for COVID-19 are in a state of rapid change based on information released by regulatory bodies including the CDC and federal and state organizations. These policies and algorithms were followed during the patient's care in the ED.  ____________________________________________   FINAL CLINICAL IMPRESSION(S) / ED DIAGNOSES  Dyspnea COPD CAP   Minna Antis, MD 12/29/19 1429

## 2019-12-29 NOTE — ED Notes (Signed)
Pt attempting to get out of bed, states he's got to go home bc his wife is elderly and can't take care of herself.

## 2019-12-29 NOTE — H&P (Signed)
History and Physical    Gary Thomas:[id number] DOB: January 01, 1932 DOA: 12/29/2019  PCP: Idelle Crouch, MD  Patient coming from: home   Chief Complaint:sob  HPI: Gary Thomas is a 86 y.o. male with medical history significant of [name] is a 86 y.o. male with a past medical history of diabetes, gastric reflux, hypertension, CVA, sleep apnea, chronic PE, COPD, presents to the emergency department for shortness of breath.. Patient has shortness of breath for the past couple of months but last couple of days started having sneezing with cough and feeling congested.  His shortness of breath has worsened.  Denies fever, chills, chest pain.  Feels his shortness of breath is worse when he lays down due to the congestion. Was discharged from the hospital last month when he was admitted for sepsis and community-acquired pneumonia with acute hypoxemic respiratory failure.  He was discharged with Augmentin. ED Course:EMS report bil wheezing, RA sat 85%, improved to 99% on 2L after x1 duoneb. RA sat 89% on arrival.  Was started on azithromycin and ceftriaxone with IV Solu-Medrol. BNP was 104 less than before.  Potassium 5.3, creatinine 1.42 with WBC of 13.2. covid neg. ER ordered CTA chest pending  Review of Systems: All systems reviewed and otherwise negative.    Past Medical History:  Diagnosis Date  . Depression   . Diabetes mellitus without complication ([place])   . Dyspnea   . Elevated lipids   . Femur fracture ([place]) 08/22/2018  . GERD (gastroesophageal reflux disease)   . Hypertension   . Pulmonary emboli ([place])   . Renal insufficiency    only has 1 kidney  . Sleep apnea   . Stroke ([place])    Rt side weakness    Past Surgical History:  Procedure Laterality Date  . 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 AORTA SURGERY    . CAROTID ARTERY - SUBCLAVIAN ARTERY BYPASS GRAFT Bilateral   . CATARACT EXTRACTION W/ INTRAOCULAR LENS   IMPLANT, BILATERAL    . CHOLECYSTECTOMY    . EYE SURGERY    . FEMUR FRACTURE SURGERY Right   . HIP FRACTURE SURGERY Bilateral   . I & D EXTREMITY Right 10/18/2018   Procedure: IRRIGATION AND DEBRIDEMENT EXTREMITY, POSSIBLE HARDWARE REMOVAL OF SUPERCONDYLAR PLATE;  Surgeon: Corky Mull, MD;  Location: [facility] ORS;  Service: Orthopedics;  Laterality: Right;  . JOINT REPLACEMENT    . ORIF FEMUR FRACTURE Right 08/24/2018   Procedure: OPEN REDUCTION INTERNAL FIXATION (ORIF) SUPRACONDYLAR FEMUR FRACTURE;  Surgeon: Corky Mull, MD;  Location: [facility] OR[facility];  Service: Orthopedics;  Laterality: Right;  . ORIF FEMUR FRACTURE Right 10/18/2018   Procedure: HARDWARE REMOVAL- DISTAL FEMUR FRACTURE FIXATION;  Surgeon: Corky Mull, MD;  Location: [facility] ORS;  Service: Orthopedics;  Laterality: Right;  . PROSTATE SURGERY    . REPLACEMENT TOTAL KNEE Right   . TOTAL HIP ARTHROPLASTY Right 03/02/2017   Procedure: TOTAL HIP ARTHROPLASTY ANTERIOR APPROACH;  Surgeon: [name], MD;  Location: [facility] ORS;  Service: Orthopedics;  Laterality: Right;  Marland Kitchen VASCULAR SURGERY       reports that he quit smoking about 67 years ago. His smoking use included cigarettes. He has a 7.00 pack-year smoking history. He has quit using smokeless tobacco. He reports that he does not drink alcohol or use drugs.  Allergies  Allergen Reactions  . Codeine Anaphylaxis  . Morphine Anaphylaxis  . Gabapentin Other (See Comments)  Unsure of what the reaction was   . Stadol [Butorphanol]     Unknown  . Lyrica [Pregabalin] Rash  . Tapentadol Nausea Only    ONSET 01/04/2013  . Tramadol     Hallucinations "I see giant spiders on ceiling". Pt also believes he is in [facility], but he is in [place].    Family History  Problem Relation Age of Onset  . Heart attack Father   . Stroke Father   . Heart disease Father   . Heart attack Mother      Prior to Admission medications   Medication Sig Start Date End Date Taking? Authorizing Provider    acetaminophen (TYLENOL) 325 MG tablet Take 2 tablets (650 mg total) by mouth every 6 (six) hours as needed for mild pain (or Fever >/= 101). 08/30/18   Gouru, Deanna Artis, MD  albuterol (PROVENTIL) (2.5 MG/3ML) 0.083% nebulizer solution Take 3 mLs (2.5 mg total) by nebulization every 4 (four) hours as needed for wheezing or shortness of breath. 09/14/18   Sharee Holster, NP  allopurinol (ZYLOPRIM) 100 MG tablet Take 0.5 tablets (50 mg total) by mouth every other day. 09/14/18   Sharee Holster, NP  amLODipine (NORVASC) 5 MG tablet Take 1 tablet (5 mg total) by mouth daily. 11/21/19   Lurene Shadow, MD  atorvastatin (LIPITOR) 20 MG tablet Take 1 tablet (20 mg total) by mouth at bedtime. 09/14/18   Sharee Holster, NP  budesonide (PULMICORT) 0.5 MG/2ML nebulizer solution Take 2 mLs (0.5 mg total) by nebulization 2 (two) times daily. 09/14/18   Sharee Holster, NP  Cholecalciferol 100 MCG (4000 UT) CAPS Take 1 capsule by mouth daily. 08/31/18   [provider]  furosemide (LASIX) 40 MG tablet Take 1 tablet (40 mg total) by mouth at bedtime. 09/14/18   Sharee Holster, NP  [employer] [facility] EX) Apply liberal amount topically as needed to area of skin irritation.  Ok to leave at bedside 09/02/18   [provider]  loperamide (IMODIUM A-D) 2 MG tablet Take 2 mg by mouth 4 (four) times daily as needed for diarrhea or loose stools. 09/07/18   [provider]  metoprolol succinate (TOPROL-XL) 100 MG 24 hr tablet Take 1 tablet (100 mg total) by mouth at bedtime. 09/14/18   Sharee Holster, NP  mirtazapine (REMERON) 15 MG tablet Take 15 mg by mouth at bedtime.    [provider]  multivitamin-lutein (OCUVITE-LUTEIN) CAPS capsule Take 1 capsule by mouth daily.    [provider]  nystatin (MYCOSTATIN/NYSTOP) powder Apply topically as directed. Apply with each ostomy change as instructed    [provider]  omeprazole (PRILOSEC) 40 MG capsule Take 1  capsule (40 mg total) by mouth daily. 09/14/18   Sharee Holster, NP  sertraline (ZOLOFT) 100 MG tablet Take 1 tablet (100 mg total) by mouth at bedtime. 09/14/18   Sharee Holster, NP  tiotropium (SPIRIVA) 18 MCG inhalation capsule Place 18 mcg into inhaler and inhale daily. 08/30/18   [provider]    Physical Exam: Vitals:   12/29/19 1308  BP: (!) 161/64  Pulse: 77  Resp: (!) 22  Temp: (!) 97.5 F (36.4 C)  TempSrc: Oral  SpO2: (!) 89%  Weight: 97.1 kg  Height: 5\' 9"  (1.753 m)    Constitutional: NAD, calm, comfortable Vitals:   12/29/19 1308  BP: (!) 161/64  Pulse: 77  Resp: (!) 22  Temp: (!) 97.5 F (36.4 C)  TempSrc: Oral  SpO2: (!) 89%  Weight: 97.1 kg  Height: 5\' 9"  (1.753 m)  GEN: becomes a little sob with talking, wife at bedside. On hallway  Eyes: PERRL, lids and conjunctivae normal ENMT: mask on. Neck: normal, supple, no masses,  Respiratory: mild b/l wheezing, no crackles. Normal respiratory effort. Cardiovascular: Regular rate and rhythm, no murmurs / rubs / gallops. +pitting edema with chronic skin changes b/l Abdomen: no tenderness, no masses palpated. Bowel sounds positive.  Musculoskeletal: no clubbing / cyanosis.extremities.  Skin: no rashes, lesions, ulcers.  Neurologic: CN 2-12 grossly intact. aaxox3 Psychiatric: Normal judgment and insight.  Labs on Admission: I have personally reviewed following labs and imaging studies  CBC: Recent Labs  Lab 12/29/19 1309  WBC 13.2*  HGB 15.1  HCT 47.4  MCV 93.3  PLT 151   Basic Metabolic Panel: Recent Labs  Lab 12/29/19 1309  NA 142  K 5.3*  CL 108  CO2 26  GLUCOSE 117*  BUN 45*  CREATININE 1.42*  CALCIUM 9.4   GFR: Estimated Creatinine Clearance: 43.7 mL/min (A) (by C-G formula based on SCr of 1.42 mg/dL (H)). Liver Function Tests: Recent Labs  Lab 12/29/19 1309  AST 16  ALT 14  ALKPHOS 78  BILITOT 0.8  PROT 7.0  ALBUMIN 3.8   No results for input(s): LIPASE, AMYLASE  in the last 168 hours. No results for input(s): AMMONIA in the last 168 hours. Coagulation Profile: No results for input(s): INR, PROTIME in the last 168 hours. Cardiac Enzymes: No results for input(s): CKTOTAL, CKMB, CKMBINDEX, TROPONINI in the last 168 hours. BNP (last 3 results) No results for input(s): PROBNP in the last 8760 hours. HbA1C: No results for input(s): HGBA1C in the last 72 hours. CBG: No results for input(s): GLUCAP in the last 168 hours. Lipid Profile: No results for input(s): CHOL, HDL, LDLCALC, TRIG, CHOLHDL, LDLDIRECT in the last 72 hours. Thyroid Function Tests: No results for input(s): TSH, T4TOTAL, FREET4, T3FREE, THYROIDAB in the last 72 hours. Anemia Panel: No results for input(s): VITAMINB12, FOLATE, FERRITIN, TIBC, IRON, RETICCTPCT in the last 72 hours. Urine analysis:    Component Value Date/Time   COLORURINE YELLOW (A) 11/17/2019 1337   APPEARANCEUR CLOUDY (A) 11/17/2019 1337   LABSPEC 1.017 11/17/2019 1337   PHURINE 8.0 11/17/2019 1337   GLUCOSEU NEGATIVE 11/17/2019 1337   HGBUR NEGATIVE 11/17/2019 1337   BILIRUBINUR NEGATIVE 11/17/2019 1337   KETONESUR NEGATIVE 11/17/2019 1337   PROTEINUR 100 (A) 11/17/2019 1337   NITRITE NEGATIVE 11/17/2019 1337   LEUKOCYTESUR TRACE (A) 11/17/2019 1337    Radiological Exams on Admission: DG Chest 2 View  Result Date: 12/29/2019 CLINICAL DATA:  Shortness of breath beginning today, history COPD, pulmonary embolism, hypertension, GERD, stroke EXAM: CHEST - 2 VIEW COMPARISON:  Portable exam of 11/17/2019 FINDINGS: Normal heart size, mediastinal contours, and pulmonary vascularity. Atherosclerotic calcification aorta. Chronic elevation RIGHT diaphragm with RIGHT basilar atelectasis. Chronic bronchitic changes. No infiltrate, pleural effusion or pneumothorax. Osseous structures unremarkable. IMPRESSION: Mild chronic bronchitic changes and RIGHT basilar atelectasis. No acute abnormalities. Electronically Signed   By:  01/15/2020 M.D.   On: 12/29/2019 13:48    EKG: Independently reviewed. NSR.   Assessment/Plan Active Problems:   Pneumonia    1.HCAP- admitted with PNA last month. Start iv vanco and cefepime F/u cta chest   2.Acute hypoxemic respiratory failure- hypoxic in 80's on admission. cxr as above Treat pna cta chest pending Incentive spirometr.  2. IDDM- hold home meds RISS Ck  fs Hypoglycemic protocal  3.acute copd exacerbation- Iv steroids duonebs  4.CKD stage IIb- at baseline Monitor Hold lasix if getting cta     DVT prophylaxis: lovenox Code Status: full Family Communication: wife at bedside , updated Disposition Plan: back home Consults called: none  Admission status: observation, as patient requires <2MN stays  Lynn Ito MD [facility] Pager [phone]   If 7PM-7AM, please contact night-coverage [email or web address] Password [facility] : [facility]  12/29/2019, 3:21 PM

## 2019-12-29 NOTE — ED Triage Notes (Signed)
Pt presents to ED via [facility] c/o SOB starting today. EMS report bil wheezing, RA sat 85%, improved to 99% on 2L after x1 duoneb. RA sat 89% on arrival, pt left on 2L n/c. Hx COPD. Afebrile, states he has had both covid vaccines.

## 2019-12-29 NOTE — ED Notes (Signed)
Attempted to give heads up/report. Extension provided for callback

## 2019-12-30 DIAGNOSIS — E1122 Type 2 diabetes mellitus with diabetic chronic kidney disease: Secondary | ICD-10-CM | POA: Diagnosis present

## 2019-12-30 DIAGNOSIS — E876 Hypokalemia: Secondary | ICD-10-CM | POA: Diagnosis not present

## 2019-12-30 DIAGNOSIS — I2510 Atherosclerotic heart disease of native coronary artery without angina pectoris: Secondary | ICD-10-CM | POA: Diagnosis present

## 2019-12-30 DIAGNOSIS — J189 Pneumonia, unspecified organism: Secondary | ICD-10-CM | POA: Diagnosis present

## 2019-12-30 DIAGNOSIS — F329 Major depressive disorder, single episode, unspecified: Secondary | ICD-10-CM | POA: Diagnosis present

## 2019-12-30 DIAGNOSIS — E875 Hyperkalemia: Secondary | ICD-10-CM | POA: Diagnosis present

## 2019-12-30 DIAGNOSIS — E7849 Other hyperlipidemia: Secondary | ICD-10-CM | POA: Diagnosis present

## 2019-12-30 DIAGNOSIS — J441 Chronic obstructive pulmonary disease with (acute) exacerbation: Secondary | ICD-10-CM | POA: Diagnosis present

## 2019-12-30 DIAGNOSIS — K219 Gastro-esophageal reflux disease without esophagitis: Secondary | ICD-10-CM | POA: Diagnosis present

## 2019-12-30 DIAGNOSIS — I2782 Chronic pulmonary embolism: Secondary | ICD-10-CM | POA: Diagnosis present

## 2019-12-30 DIAGNOSIS — J440 Chronic obstructive pulmonary disease with acute lower respiratory infection: Secondary | ICD-10-CM | POA: Diagnosis present

## 2019-12-30 DIAGNOSIS — J9601 Acute respiratory failure with hypoxia: Secondary | ICD-10-CM | POA: Diagnosis present

## 2019-12-30 DIAGNOSIS — E1151 Type 2 diabetes mellitus with diabetic peripheral angiopathy without gangrene: Secondary | ICD-10-CM | POA: Diagnosis present

## 2019-12-30 DIAGNOSIS — Z9049 Acquired absence of other specified parts of digestive tract: Secondary | ICD-10-CM | POA: Diagnosis not present

## 2019-12-30 DIAGNOSIS — I700 Atherosclerosis of aorta: Secondary | ICD-10-CM | POA: Diagnosis present

## 2019-12-30 DIAGNOSIS — E896 Postprocedural adrenocortical (-medullary) hypofunction: Secondary | ICD-10-CM | POA: Diagnosis present

## 2019-12-30 DIAGNOSIS — I129 Hypertensive chronic kidney disease with stage 1 through stage 4 chronic kidney disease, or unspecified chronic kidney disease: Secondary | ICD-10-CM | POA: Diagnosis present

## 2019-12-30 DIAGNOSIS — N1832 Chronic kidney disease, stage 3b: Secondary | ICD-10-CM | POA: Diagnosis present

## 2019-12-30 DIAGNOSIS — N189 Chronic kidney disease, unspecified: Secondary | ICD-10-CM

## 2019-12-30 DIAGNOSIS — I69351 Hemiplegia and hemiparesis following cerebral infarction affecting right dominant side: Secondary | ICD-10-CM | POA: Diagnosis not present

## 2019-12-30 DIAGNOSIS — Y95 Nosocomial condition: Secondary | ICD-10-CM | POA: Diagnosis present

## 2019-12-30 DIAGNOSIS — M1A9XX0 Chronic gout, unspecified, without tophus (tophi): Secondary | ICD-10-CM | POA: Diagnosis present

## 2019-12-30 DIAGNOSIS — Z96651 Presence of right artificial knee joint: Secondary | ICD-10-CM | POA: Diagnosis present

## 2019-12-30 DIAGNOSIS — Z20822 Contact with and (suspected) exposure to covid-19: Secondary | ICD-10-CM | POA: Diagnosis present

## 2019-12-30 DIAGNOSIS — I10 Essential (primary) hypertension: Secondary | ICD-10-CM | POA: Diagnosis not present

## 2019-12-30 DIAGNOSIS — G4730 Sleep apnea, unspecified: Secondary | ICD-10-CM | POA: Diagnosis present

## 2019-12-30 DIAGNOSIS — N08 Glomerular disorders in diseases classified elsewhere: Secondary | ICD-10-CM | POA: Diagnosis present

## 2019-12-30 LAB — BASIC METABOLIC PANEL
Anion gap: 7 (ref 5–15)
BUN: 45 mg/dL — ABNORMAL HIGH (ref 8–23)
CO2: 22 mmol/L (ref 22–32)
Calcium: 9.4 mg/dL (ref 8.9–10.3)
Chloride: 111 mmol/L (ref 98–111)
Creatinine, Ser: 1.58 mg/dL — ABNORMAL HIGH (ref 0.61–1.24)
GFR calc Af Amer: 46 mL/min — ABNORMAL LOW (ref >60)
GFR calc non Af Amer: 39 mL/min — ABNORMAL LOW (ref >60)
Glucose, Bld: 136 mg/dL — ABNORMAL HIGH (ref 70–99)
Potassium: 5.7 mmol/L — ABNORMAL HIGH (ref 3.5–5.1)
Sodium: 140 mmol/L (ref 135–145)

## 2019-12-30 LAB — GLUCOSE, CAPILLARY
Glucose-Capillary: 123 mg/dL — ABNORMAL HIGH (ref 70–99)
Glucose-Capillary: 123 mg/dL — ABNORMAL HIGH (ref 70–99)
Glucose-Capillary: 132 mg/dL — ABNORMAL HIGH (ref 70–99)
Glucose-Capillary: 136 mg/dL — ABNORMAL HIGH (ref 70–99)
Glucose-Capillary: 138 mg/dL — ABNORMAL HIGH (ref 70–99)
Glucose-Capillary: 165 mg/dL — ABNORMAL HIGH (ref 70–99)
Glucose-Capillary: 178 mg/dL — ABNORMAL HIGH (ref 70–99)

## 2019-12-30 LAB — POTASSIUM
Potassium: 6.3 mmol/L (ref 3.5–5.1)
Potassium: 5.2 mmol/L — ABNORMAL HIGH (ref 3.5–5.1)

## 2019-12-30 LAB — HIV ANTIBODY (ROUTINE TESTING W REFLEX): HIV Screen 4th Generation wRfx: NONREACTIVE

## 2019-12-30 LAB — MRSA PCR SCREENING: MRSA by PCR: POSITIVE — AB

## 2019-12-30 MED ORDER — VANCOMYCIN HCL IN DEXTROSE 1-5 GM/200ML-% IV SOLN
1000.0000 mg | INTRAVENOUS | Status: DC
  Administered 2019-12-30 – 2020-01-01 (×3): 1000 mg via INTRAVENOUS
  Filled 2019-12-30 (×4): qty 200

## 2019-12-30 MED ORDER — FUROSEMIDE 10 MG/ML IJ SOLN
40.0000 mg | Freq: Once | INTRAMUSCULAR | Status: AC
  Administered 2019-12-30: 40 mg via INTRAVENOUS
  Filled 2019-12-30: qty 4

## 2019-12-30 MED ORDER — INSULIN ASPART 100 UNIT/ML IV SOLN
5.0000 [IU] | Freq: Once | INTRAVENOUS | Status: AC
  Administered 2019-12-30: 5 [IU] via INTRAVENOUS
  Filled 2019-12-30: qty 0.05

## 2019-12-30 MED ORDER — SODIUM POLYSTYRENE SULFONATE 15 GM/60ML PO SUSP
30.0000 g | Freq: Once | ORAL | Status: AC
  Administered 2019-12-30: 30 g via ORAL
  Filled 2019-12-30: qty 120

## 2019-12-30 MED ORDER — SODIUM CHLORIDE 0.9% FLUSH
3.0000 mL | Freq: Two times a day (BID) | INTRAVENOUS | Status: DC
  Administered 2019-12-30 – 2020-01-02 (×5): 3 mL via INTRAVENOUS

## 2019-12-30 MED ORDER — SODIUM CHLORIDE 0.9 % IV SOLN
INTRAVENOUS | Status: DC | PRN
  Administered 2019-12-30 – 2019-12-31 (×3): 250 mL via INTRAVENOUS

## 2019-12-30 NOTE — Progress Notes (Signed)
PROGRESS NOTE    Gary Thomas  SWH:[id number] DOB: 1932-07-09 DOA: 12/29/2019 PCP: Marguarite Arbour, MD    Brief Narrative:  Gary Thomas is a 86 y.o. male with medical history significant of Gary R Battenis a 86 y.o.malewith a past medical history of diabetes, gastric reflux, hypertension, CVA, sleep apnea, chronic PE, COPD, presents to the emergency department for shortness of breath.. Patient has shortness of breath for the past couple of months but last couple of days started having sneezing with cough and feeling congested.  His shortness of breath has worsened.  Denies fever, chills, chest pain.  Feels his shortness of breath is worse when he lays down due to the congestion. Was discharged from the hospital last month when he was admitted for sepsis and community-acquired pneumonia with acute hypoxemic respiratory failure.  He was discharged with Augmentin. ED Course:EMS report bil wheezing, RA sat 85%, improved to 99% on 2L after x1 duoneb. RA sat 89% on arrival.  Was started on azithromycin and ceftriaxone with IV Solu-Medrol. BNP was 104 less than before.  Potassium 5.3, creatinine 1.42 with WBC of 13.2. covid neg. ER ordered CTA chest pending    Consultants:  NONE  Procedures:  CT 1. Persistent consolidation in the MEDIAL RIGHT LOWER lobe, slightly improved. 2. Patchy ground-glass opacity in the superior segment of the LEFT LOWER lobe is slightly less dense but otherwise unchanged. Recommend follow-up chest CT in 6 months. 3. Stable elevation of RIGHT hemidiaphragm. 4. Coronary artery disease. 5. Numerous hepatic cysts. 6. Colonic diverticulosis. 7. Pulmonary artery enlargement consistent with pulmonary arterial hypertension. 8. Aortic Atherosclerosis (ICD10-I70.0).  Antimicrobials:   Cefepime, vanco   Subjective: Feeling little better, not at baseline. No new complaints.   Objective: Vitals:   12/29/19 2113 12/30/19 0336 12/30/19 0901 12/30/19  1222  BP: (!) 156/93 (!) 156/73 (!) 154/90 (!) 142/70  Pulse: 94 84 99 91  Resp: 19  18 18   Temp: 97.7 F (36.5 C) (!) 97.4 F (36.3 C) 97.8 F (36.6 C) 98.1 F (36.7 C)  TempSrc: Oral Oral Oral Oral  SpO2: 96% 95% 97% 94%  Weight: 98.4 kg 96.2 kg    Height: 5\' 9"  (1.753 m)       Intake/Output Summary (Last 24 hours) at 12/30/2019 1542 Last data filed at 12/30/2019 1236 Gross per 24 hour  Intake 96.46 ml  Output 575 ml  Net -478.54 ml   Filed Weights   12/29/19 1308 12/29/19 2113 12/30/19 0336  Weight: 97.1 kg 98.4 kg 96.2 kg    Examination:  General exam: Appears calm and comfortable, wife at bedside.  Respiratory system: Clear to auscultation. Respiratory effort normal. Cardiovascular system: S1 & S2 heard, RRR. No JVD, murmurs, rubs, gallops or clicks.  Gastrointestinal system: Abdomen is nondistended, soft and nontender. Normal bowel sounds heard. Central nervous system: Alert and oriented. No focal neurological deficits. Extremities: no edema Skin: warm, dry Psychiatry: Judgement and insight appear normal. Mood & affect appropriate.     Data Reviewed: I have personally reviewed following labs and imaging studies  CBC: Recent Labs  Lab 12/29/19 1309 12/29/19 2113  WBC 13.2* 14.3*  HGB 15.1 14.1  HCT 47.4 44.4  MCV 93.3 93.7  PLT 151 145*   Basic Metabolic Panel: Recent Labs  Lab 12/29/19 1309 12/30/19 0547 12/30/19 1034 12/30/19 1425  NA 142 140  --   --   K 5.3* 5.7* 6.3* 5.2*  CL 108 111  --   --  CO2 26 22  --   --   GLUCOSE 117* 136*  --   --   BUN 45* 45*  --   --   CREATININE 1.42* 1.58*  --   --   CALCIUM 9.4 9.4  --   --    GFR: Estimated Creatinine Clearance: 39.1 mL/min (A) (by C-G formula based on SCr of 1.58 mg/dL (H)). Liver Function Tests: Recent Labs  Lab 12/29/19 1309  AST 16  ALT 14  ALKPHOS 78  BILITOT 0.8  PROT 7.0  ALBUMIN 3.8   No results for input(s): LIPASE, AMYLASE in the last 168 hours. No results for  input(s): AMMONIA in the last 168 hours. Coagulation Profile: No results for input(s): INR, PROTIME in the last 168 hours. Cardiac Enzymes: No results for input(s): CKTOTAL, CKMB, CKMBINDEX, TROPONINI in the last 168 hours. BNP (last 3 results) No results for input(s): PROBNP in the last 8760 hours. HbA1C: Recent Labs    12/29/19 2113  HGBA1C 5.9*   CBG: Recent Labs  Lab 12/30/19 0005 12/30/19 0333 12/30/19 0741 12/30/19 1139 12/30/19 1329  GLUCAP 165* 138* 123* 123* 132*   Lipid Profile: No results for input(s): CHOL, HDL, LDLCALC, TRIG, CHOLHDL, LDLDIRECT in the last 72 hours. Thyroid Function Tests: No results for input(s): TSH, T4TOTAL, FREET4, T3FREE, THYROIDAB in the last 72 hours. Anemia Panel: No results for input(s): VITAMINB12, FOLATE, FERRITIN, TIBC, IRON, RETICCTPCT in the last 72 hours. Sepsis Labs: No results for input(s): PROCALCITON, LATICACIDVEN in the last 168 hours.  Recent Results (from the past 240 hour(s))  MRSA PCR Screening     Status: Abnormal   Collection Time: 12/29/19  3:34 PM   Specimen: Nasal Mucosa; Nasopharyngeal  Result Value Ref Range Status   MRSA by PCR POSITIVE (A) NEGATIVE Final    Comment:        The GeneXpert MRSA Assay (FDA approved for NASAL specimens only), is one component of a comprehensive MRSA colonization surveillance program. It is not intended to diagnose MRSA infection nor to guide or monitor treatment for MRSA infections. RESULT CALLED TO, READ BACK BY AND VERIFIED WITH: Sande Brothers RN AT 1422 ON 12/30/19 SNG Performed at [facility], [address] [place], [place] [address]   SARS CORONAVIRUS 2 (TAT 6-24 HRS) Nasopharyngeal Nasopharyngeal Swab     Status: None   Collection Time: 12/29/19  3:53 PM   Specimen: Nasopharyngeal Swab  Result Value Ref Range Status   SARS Coronavirus 2 NEGATIVE NEGATIVE Final    Comment: (NOTE) SARS-CoV-2 target nucleic acids are NOT DETECTED. The SARS-CoV-2 RNA is  generally detectable in upper and lower respiratory specimens during the acute phase of infection. Negative results do not preclude SARS-CoV-2 infection, do not rule out co-infections with other pathogens, and should not be used as the sole basis for treatment or other patient management decisions. Negative results must be combined with clinical observations, patient history, and epidemiological information. The expected result is Negative. Fact Sheet for Patients: HairSlick.no Fact Sheet for Healthcare Providers: quierodirigir.com This test is not yet approved or cleared by the [place] FDA and  has been authorized for detection and/or diagnosis of SARS-CoV-2 by FDA under an Emergency Use Authorization (EUA). This EUA will remain  in effect (meaning this test can be used) for the duration of the COVID-19 declaration under Section 56 4(b)(1) of the Act, 21 U.S.C. section 360bbb-3(b)(1), unless the authorization is terminated or revoked sooner. Performed at [facility], [address] [place], [place] [address]          Radiology Studies: DG Chest 2 View  Result Date: 12/29/2019 CLINICAL DATA:  Shortness of breath beginning today, history COPD, pulmonary embolism, hypertension, GERD, stroke EXAM: CHEST - 2 VIEW COMPARISON:  Portable exam of 11/17/2019 FINDINGS: Normal heart size, mediastinal contours, and pulmonary vascularity. Atherosclerotic calcification aorta. Chronic elevation RIGHT diaphragm with RIGHT basilar atelectasis. Chronic bronchitic changes. No infiltrate, pleural effusion or pneumothorax. Osseous structures unremarkable. IMPRESSION: Mild chronic bronchitic changes and RIGHT basilar atelectasis. No acute abnormalities. Electronically Signed   By: Ulyses Southward M.D.   On: 12/29/2019 13:48   CT Chest Wo Contrast  Result Date: 12/29/2019 CLINICAL DATA:  Hypoxemia. Shortness of breath started today. Bilateral  wheezing. Low oxygen sats. History of COPD. EXAM: CT CHEST WITHOUT CONTRAST TECHNIQUE: Multidetector CT imaging of the chest was performed following the standard protocol without IV contrast. COMPARISON:  CT angio chest on 11/17/2019, chest x-ray today FINDINGS: Cardiovascular: Heart size is normal. No pericardial effusion. There is dense atherosclerotic calcification of the coronary arteries. There is atherosclerotic calcification of the thoracic aorta not associated with aneurysm. Stable dilatation of the pulmonary artery. Mediastinum/Nodes: The esophagus is unremarkable. No significant mediastinal, hilar, or axillary adenopathy. LEFT thyroid nodule is 1.3 centimeters and associated with punctate calcifications. Subcentimeter partially calcified RIGHT thyroid nodule is also present, both stable in appearance. No followup recommended (ref: J Am Coll Radiol. 2015 Feb;12(2): 143-50). Lungs/Pleura: Persistent consolidation in the MEDIAL RIGHT LOWER lobe has improved slightly. Patchy ground-glass opacities in the superior segment of the LEFT LOWER lobe is slightly less dense but persists. No pleural effusions. Airways are patent. Upper Abdomen: Numerous hepatic cysts. Stable elevation of RIGHT hemidiaphragm. Colonic diverticulosis. Musculoskeletal: Degenerative changes are present in the thoracic spine. No suspicious lytic or blastic lesions are identified. IMPRESSION: 1. Persistent consolidation in the MEDIAL RIGHT LOWER lobe, slightly improved. 2. Patchy ground-glass opacity in the superior segment of the LEFT LOWER lobe is slightly less dense but otherwise unchanged. Recommend follow-up chest CT in 6 months. 3. Stable elevation of RIGHT hemidiaphragm. 4. Coronary artery disease. 5. Numerous hepatic cysts. 6. Colonic diverticulosis. 7. Pulmonary artery enlargement consistent with pulmonary arterial hypertension. 8. Aortic Atherosclerosis (ICD10-I70.0). Electronically Signed   By: Norva Pavlov M.D.   On:  12/29/2019 15:22        Scheduled Meds: . amLODipine  5 mg Oral Daily  . atorvastatin  20 mg Oral QHS  . cholecalciferol  4,000 Units Oral Daily  . enoxaparin (LOVENOX) injection  40 mg Subcutaneous Q24H  . fluticasone furoate-vilanterol  1 puff Inhalation Daily  . insulin aspart  0-15 Units Subcutaneous Q4H  . mirtazapine  15 mg Oral QHS  . multivitamin-lutein  1 capsule Oral Daily  . pantoprazole  40 mg Oral Daily  . [START ON 12/31/2019] predniSONE  40 mg Oral Q breakfast  . sertraline  100 mg Oral QHS   Continuous Infusions: . sodium chloride 250 mL (12/30/19 0925)  . ceFEPime (MAXIPIME) IV 2 g (12/30/19 0926)  . vancomycin      Assessment & Plan:   Active Problems:   Pneumonia   HCAP (healthcare-associated pneumonia)   1.HCAP- admitted with PNA last month. continue iv vanco and cefepime CT done , results above  2.Acute hypoxemic respiratory failure- hypoxic in 80's on admission. cxr as above Treat pna Incentive spirometr.  2. IDDM- hold home meds RISS Ck fs Hypoglycemic protocal  3.acute copd exacerbation- Iv steroids..>start po duonebs  4.CKD stage IIb- at baseline Monitor  5.Hyperkalemia-  Place on low k diet Lasix 40mg  iv x1 Insulin 5 U  x1 Ck fs post insulin Kayexalate 30gm x1     DVT prophylaxis: lovenox Code Status: full Family Communication: wife at bedside , updated Disposition Plan:PT rec. SNF Barrier: hyperkalemic, needs treatment. pna active, sob not at baseline yet.will need SNF placement         LOS: 0 days   Time spent: 45 min with >50 % coc    Nolberto Hanlon, MD [facility] Pager [phone]  If 7PM-7AM, please contact night-coverage [email or web address] Password [facility] 12/30/2019, 3:42 PM

## 2019-12-30 NOTE — Progress Notes (Signed)
OT Cancellation Note  Patient Details Name: MATTI MINNEY MRN: [id number] DOB: Oct 02, 1932   Cancelled Treatment:    Reason Eval/Treat Not Completed: Medical issues which prohibited therapy Thank you for the OT consult. Order received and chart reviewed. Per chart review, pt noted to have Potassium levels 5.7 as of this AM. Per therapy protocols, will hold until medically stable. Will follow acutely and initiate services as pt medically appropriate.   Kathie Dike, M.S. OTR/L  12/30/19, 8:36 AM

## 2019-12-30 NOTE — TOC Initial Note (Signed)
Transition of Care [facility]) - Initial/Assessment Note    Patient Details  Name: Gary Thomas MRN: [id number] Date of Birth: June 26, 1932  Transition of Care [facility]) CM/SW Contact:    Marshell Garfinkel, RN Phone Number: 12/30/2019, 2:57 PM  Clinical Narrative:                 Patient from home with his wife. She drives and take him to see Dr. Doy Hutching when needed. O2 is acute per patient. He states he has a walker and two wheelchairs available at home. He has declined bed search for rehab services.  He said he is "86 years old and doesn't want COvid". He plans to return to home with his wife. He has not agreed to home health services at this time.   Expected Discharge Plan: [place] Barriers to Discharge: Continued Medical Work up   Patient Goals and CMS Choice Patient states their goals for this hospitalization and ongoing recovery are:: "I"m going home" CMS Medicare.gov Compare Post Acute Care list provided to:: Patient    Expected Discharge Plan and Services Expected Discharge Plan: [place]                                              Prior Living Arrangements/Services                       Activities of Daily Living Home Assistive Devices/Equipment: Dentures (specify type), Eyeglasses, Oxygen, Wheelchair ADL Screening (condition at time of admission) Patient's cognitive ability adequate to safely complete daily activities?: Yes Is the patient deaf or have difficulty hearing?: No Does the patient have difficulty seeing, even when wearing glasses/contacts?: No Does the patient have difficulty concentrating, remembering, or making decisions?: No Patient able to express need for assistance with ADLs?: Yes Does the patient have difficulty dressing or bathing?: Yes Independently performs ADLs?: No Communication: Independent Dressing (OT): Independent Grooming: Independent Feeding: Independent Bathing: Needs assistance Is  this a change from baseline?: Pre-admission baseline Toileting: Dependent Is this a change from baseline?: Pre-admission baseline In/Out Bed: Dependent Is this a change from baseline?: Pre-admission baseline Walks in Home: Dependent Is this a change from baseline?: Pre-admission baseline Does the patient have difficulty walking or climbing stairs?: Yes Weakness of Legs: Both Weakness of Arms/Hands: Both  Permission Sought/Granted                  Emotional Assessment              Admission diagnosis:  Pneumonia [J18.9] COPD exacerbation ([place]) [J44.1] Community acquired pneumonia of right lower lobe of lung [J18.9] HCAP (healthcare-associated pneumonia) [J18.9] Patient Active Problem List   Diagnosis Date Noted  . HCAP (healthcare-associated pneumonia) 12/30/2019  . Pneumonia 12/29/2019  . Community acquired pneumonia 11/17/2019  . Acute hypoxemic respiratory failure ([place]) 11/17/2019  . GI bleed 02/07/2019  . Postoperative wound infection 10/17/2018  . Supracondylar fracture of distal end of femur without intracondylar extension ([place]) 10/17/2018  . Sepsis ([place]) 10/06/2018  . Chronic pulmonary embolism ([place]) 09/06/2018  . Primary osteoarthritis of right knee 09/06/2018  . Status post right knee replacement 09/06/2018  . Dyslipidemia associated with type 2 diabetes mellitus ([place]) 09/06/2018  . CKD stage 3 due to type 2 diabetes mellitus ([place]) 09/06/2018  . Controlled type 2 diabetes mellitus with  stage 3 chronic kidney disease, with long-term current use of insulin (HCC) 09/06/2018  . Chronic cerebrovascular accident (CVA) 09/06/2018  . GERD without esophagitis 09/06/2018  . Closed nondisplaced supracondylar fracture of distal end of femur without intracondylar extension (HCC) 09/06/2018  . Acute on chronic anemia 09/06/2018  . Depression, major, single episode, in partial remission (HCC) 09/06/2018  . Bilateral lower extremity edema 09/06/2018  . Chronic gout due to  renal impairment 09/06/2018  . COPD with respiratory failure, acute (HCC) 08/28/2018  . Spondylolisthesis of lumbar region 02/15/2018  . PAD (peripheral artery disease) (HCC) 08/19/2017  . Hyperlipidemia 07/05/2017  . Essential hypertension 07/05/2017  . Bilateral carotid artery stenosis 07/05/2017  . Primary localized osteoarthritis of right hip 03/02/2017   PCP:  Marguarite Arbour, MD Pharmacy:   [facility] [phone], [place] - [address] [place] [address] [place] [address] Phone: [phone] Fax: [phone]  [facility] - [address] [place], [place] - [address] [place] [address] Phone: [phone] Fax: [phone]     Social Determinants of Health (SDOH) Interventions    Readmission Risk Interventions Readmission Risk Prevention Plan 02/10/2019  Transportation Screening Complete  Medication Review (RN Care Manager) Complete  PCP or Specialist appointment within 3-5 days of discharge Complete  HRI or Home Care Consult Complete  SW Recovery Care/Counseling Consult Complete  Palliative Care Screening Not Applicable  Skilled Nursing Facility Patient Refused  Some recent data might be hidden

## 2019-12-30 NOTE — Consult Note (Addendum)
Pharmacy Antibiotic Note  Gary Thomas is a 86 y.o. male admitted on 12/29/2019 with pneumonia and cellulitis.  Pharmacy has been consulted for Vancomycin and Cefepime dosing.  Plan: 1) Will decrease Vancomycin 1250 mg IV Q 24 hrs to  Vancomycin 1000mg  q24h Goal AUC 400-550. Expected AUC: 440-634 (borderline BMI)  SCr used: 1.58 (worsened from yesterday 1.42)   MRSA PCR has been ordered and if negative, will consult with hospitalist regarding potential discontinuation.   2) Continue Cefepime 2g Q12 hours  Height: 5\' 9"  (175.3 cm) Weight: 212 lb (96.2 kg) IBW/kg (Calculated) : 70.7  Temp (24hrs), Avg:97.6 F (36.4 C), Min:97.4 F (36.3 C), Max:97.8 F (36.6 C)  Recent Labs  Lab 12/29/19 1309 12/29/19 2113 12/30/19 0547  WBC 13.2* 14.3*  --   CREATININE 1.42*  --  1.58*    Estimated Creatinine Clearance: 39.1 mL/min (A) (by C-G formula based on SCr of 1.58 mg/dL (H)).    Allergies  Allergen Reactions  . Codeine Anaphylaxis  . Morphine Anaphylaxis  . Gabapentin Other (See Comments)    Unsure of what the reaction was   . Stadol [Butorphanol]     Unknown  . Lyrica [Pregabalin] Rash  . Tapentadol Nausea Only    ONSET 01/04/2013  . Tramadol     Hallucinations "I see giant spiders on ceiling". Pt also believes he is in [facility], but he is in 01/06/2013.    Antimicrobials this admission: Vancomycin 3/19 >>  Cefepime 3/19 >>   Microbiology results: 3/19 UCx: pending  3/19 MRSA PCR: pending COVID NEG  Thank you for allowing pharmacy to be a part of this patient's care.  4/19, PharmD, BCPS Clinical Pharmacist 12/30/2019 10:32 AM

## 2019-12-30 NOTE — Progress Notes (Signed)
CRITICAL VALUE ALERT  Critical Value:  K 6.3  Date & Time Notied:  12/30/19 1120  Provider Notified: Dr. Marylu Lund  Orders Received/Actions taken: orders put in by MD

## 2019-12-30 NOTE — Progress Notes (Signed)
Physical Therapy Evaluation Patient Details Name: Gary Thomas MRN: [id number] DOB: 06-09-1932 Today's Date: 12/30/2019   History of Present Illness  Gary Thomas is a 86 y.o. male with medical history significant of Gary Thomas is a 86 y.o. male with a past medical history of diabetes, gastric reflux, hypertension, CVA, sleep apnea, chronic PE, COPD, presents to the emergency department for shortness of breath.. Patient has shortness of breath for the past couple of months but last couple of days started having sneezing with cough and feeling congested.  His shortness of breath has worsened.  Denies fever, chills, chest pain.  Feels his shortness of breath is worse when he lays down due to the congestion.  Clinical Impression  Patient agrees to PT evaluation. He is able to raise his legs initially to allow PT to put on his sock/slippers, but later during MMT he is not able to raise his legs or move his legs and is having a motor control and coordination deficit. He is able to move his legs towards the edge of the bed for supine to sit bed mobility testing and needs max assist for supine <> sit bed mobility. He initially thought that he was at home last night but then when PT reminded him that he was in the hospital he remembered. He has good sitting balance static and dynamic but his O2 sat decreased to 81% on room air in sitting. His saturation returned to 90% with supine position. He was not able to attempt standing due to o2 saturation level decreasing in sitting. Patient wants to return home but was educated about deficits and that a SNF would be better for his current mobility level. He will continue to benefit from skilled PT to improve strength and mobility.     Follow Up Recommendations SNF    Equipment Recommendations  Rolling walker with 5" wheels    Recommendations for Other Services       Precautions / Restrictions Precautions Precautions: Other (comment)(unsure if he  has had falls) Restrictions Weight Bearing Restrictions: No      Mobility  Bed Mobility Overal bed mobility: Needs Assistance Bed Mobility: Supine to Sit;Sit to Supine     Supine to sit: Mod assist Sit to supine: Mod assist   General bed mobility comments: needs cues for sequencing , O2 saturation decreased to 81% in sitting  Transfers Overall transfer level: (O2 saturation decreased to 81% sitting - transfers deferred)                  Ambulation/Gait                Stairs            Wheelchair Mobility    Modified Rankin (Stroke Patients Only)       Balance Overall balance assessment: Modified Independent                                           Pertinent Vitals/Pain Pain Assessment: No/denies pain    Home Living Family/patient expects to be discharged to:: Private residence Living Arrangements: Spouse/significant other Available Help at Discharge: Family Type of Home: House Home Access: Stairs to enter Entrance Stairs-Rails: Right Entrance Stairs-Number of Steps: 3 Home Layout: One level        Prior Function Level of Independence: Independent with assistive device(s)  Hand Dominance        Extremity/Trunk Assessment   Upper Extremity Assessment Upper Extremity Assessment: Defer to OT evaluation    Lower Extremity Assessment Lower Extremity Assessment: Generalized weakness;RLE deficits/detail;LLE deficits/detail RLE Deficits / Details: (motor control is inconsistent) LLE: (motor control is inconsistent)       Communication   Communication: No difficulties  Cognition Arousal/Alertness: Awake/alert Behavior During Therapy: WFL for tasks assessed/performed Overall Cognitive Status: Within Functional Limits for tasks assessed                                 General Comments: (he thought he spent the night at his home last night)      General Comments       Exercises     Assessment/Plan    PT Assessment Patient needs continued PT services  PT Problem List Decreased strength;Decreased activity tolerance;Decreased mobility;Decreased coordination;Decreased knowledge of use of DME;Decreased safety awareness;Impaired tone       PT Treatment Interventions Gait training;Therapeutic activities;Therapeutic exercise    PT Goals (Current goals can be found in the Care Plan section)  Acute Rehab PT Goals Patient Stated Goal: to go home PT Goal Formulation: With patient Time For Goal Achievement: 01/13/20 Potential to Achieve Goals: Fair    Frequency Min 2X/week   Barriers to discharge        Co-evaluation               AM-PAC PT "6 Clicks" Mobility  Outcome Measure Help needed turning from your back to your side while in a flat bed without using bedrails?: A Little Help needed moving from lying on your back to sitting on the side of a flat bed without using bedrails?: A Lot Help needed moving to and from a bed to a chair (including a wheelchair)?: Total Help needed standing up from a chair using your arms (e.g., wheelchair or bedside chair)?: Total Help needed to walk in hospital room?: Total Help needed climbing 3-5 steps with a railing? : Total 6 Click Score: 9    End of Session Equipment Utilized During Treatment: Gait belt Activity Tolerance: Other (comment)(O2 saturation decreased to 81% on room air) Patient left: in bed;with bed alarm set Nurse Communication: Mobility status PT Visit Diagnosis: Other abnormalities of gait and mobility (R26.89);Muscle weakness (generalized) (M62.81);Difficulty in walking, not elsewhere classified (R26.2)    Time: 0940-1000 PT Time Calculation (min) (ACUTE ONLY): 20 min   Charges:   PT Evaluation $PT Eval Low Complexity: 1 Low PT Treatments $Therapeutic Activity: 8-22 mins         Gary Thomas, PT DPT 12/30/2019, 10:44 AM

## 2019-12-30 NOTE — Progress Notes (Signed)
Dr. Marylu Lund updated on patient's potassium level of 5.2 and positive  MRSA PCR over the phone.

## 2019-12-31 DIAGNOSIS — I10 Essential (primary) hypertension: Secondary | ICD-10-CM

## 2019-12-31 DIAGNOSIS — N1832 Chronic kidney disease, stage 3b: Secondary | ICD-10-CM

## 2019-12-31 DIAGNOSIS — J9601 Acute respiratory failure with hypoxia: Secondary | ICD-10-CM

## 2019-12-31 LAB — GLUCOSE, CAPILLARY
Glucose-Capillary: 109 mg/dL — ABNORMAL HIGH (ref 70–99)
Glucose-Capillary: 112 mg/dL — ABNORMAL HIGH (ref 70–99)
Glucose-Capillary: 165 mg/dL — ABNORMAL HIGH (ref 70–99)
Glucose-Capillary: 172 mg/dL — ABNORMAL HIGH (ref 70–99)
Glucose-Capillary: 84 mg/dL (ref 70–99)
Glucose-Capillary: 87 mg/dL (ref 70–99)

## 2019-12-31 LAB — BASIC METABOLIC PANEL
Anion gap: 5 (ref 5–15)
BUN: 50 mg/dL — ABNORMAL HIGH (ref 8–23)
CO2: 25 mmol/L (ref 22–32)
Calcium: 9.1 mg/dL (ref 8.9–10.3)
Chloride: 107 mmol/L (ref 98–111)
Creatinine, Ser: 1.56 mg/dL — ABNORMAL HIGH (ref 0.61–1.24)
GFR calc Af Amer: 46 mL/min — ABNORMAL LOW (ref >60)
GFR calc non Af Amer: 40 mL/min — ABNORMAL LOW (ref >60)
Glucose, Bld: 117 mg/dL — ABNORMAL HIGH (ref 70–99)
Potassium: 5 mmol/L (ref 3.5–5.1)
Sodium: 137 mmol/L (ref 135–145)

## 2019-12-31 MED ORDER — AMLODIPINE BESYLATE 5 MG PO TABS
5.0000 mg | ORAL_TABLET | Freq: Once | ORAL | Status: AC
  Administered 2019-12-31: 5 mg via ORAL
  Filled 2019-12-31: qty 1

## 2019-12-31 MED ORDER — INSULIN ASPART 100 UNIT/ML ~~LOC~~ SOLN
0.0000 [IU] | Freq: Three times a day (TID) | SUBCUTANEOUS | Status: DC
  Administered 2020-01-01: 3 [IU] via SUBCUTANEOUS
  Administered 2020-01-01: 09:00:00 2 [IU] via SUBCUTANEOUS
  Administered 2020-01-01 – 2020-01-02 (×2): 3 [IU] via SUBCUTANEOUS
  Filled 2019-12-31 (×4): qty 1

## 2019-12-31 MED ORDER — AMLODIPINE BESYLATE 10 MG PO TABS
10.0000 mg | ORAL_TABLET | Freq: Every day | ORAL | Status: DC
  Administered 2020-01-01 – 2020-01-02 (×2): 10 mg via ORAL
  Filled 2019-12-31 (×2): qty 1

## 2019-12-31 MED ORDER — INSULIN ASPART 100 UNIT/ML ~~LOC~~ SOLN: 0.0000 [IU] | Freq: Every day | SUBCUTANEOUS | Status: DC

## 2019-12-31 MED ORDER — SODIUM POLYSTYRENE SULFONATE 15 GM/60ML PO SUSP
30.0000 g | Freq: Once | ORAL | Status: AC
  Administered 2019-12-31: 30 g via ORAL
  Filled 2019-12-31: qty 120

## 2019-12-31 NOTE — Evaluation (Signed)
Occupational Therapy Evaluation Patient Details Name: Gary Thomas MRN: [id number] DOB: 09/06/1932 Today's Date: 12/31/2019    History of Present Illness Gary Thomas is a 86 y.o. male with medical history significant of Gary Thomas is a 86 y.o. male with a past medical history of diabetes, gastric reflux, hypertension, CVA, sleep apnea, chronic PE, COPD, presents to the emergency department for shortness of breath.. Patient has shortness of breath for the past couple of months but last couple of days started having sneezing with cough and feeling congested.  His shortness of breath has worsened.  Denies fever, chills, chest pain.  Feels his shortness of breath is worse when he lays down due to the congestion.   Clinical Impression   Pt was seen for OT evaluation this date. Prior to hospital admission, pt was independent, living with spouse and states he was not on any O2 at baseline. Pt presents to acute OT demonstrating impaired ADL performance, functional cognition, and functional mobility 2/2 limited command following, poor insight into deficits, functional strength/endurance deficits, and decreased fine motor coordination. Pt currently requires SETUP + MIN A for self-feeding seated in bed (desat to 89% on 2L [place] while cutting sausage patty seated in bed - resolved c PLB). Pt required MAX A for LBD at bed level. Pt would benefit from skilled OT to address noted impairments and functional limitations (see below for any additional details) in order to maximize safety and independence while minimizing falls risk and caregiver burden. Upon hospital discharge, recommend STR to maximize pt safety and return to PLOF.     Follow Up Recommendations  SNF    Equipment Recommendations  Other (comment);3 in 1 bedside commode(grab bars)    Recommendations for Other Services       Precautions / Restrictions Precautions Precautions: Fall Restrictions Weight Bearing Restrictions: No       Mobility Bed Mobility Overal bed mobility: Needs Assistance Bed Mobility: Sidelying to Sit   Sidelying to sit: Max assist       General bed mobility comments: Limited command following this session, multimodal VCs to adjust torso in bed  Transfers Overall transfer level: (Anticipate +2 transfer, deferred this session)                    Balance                                           ADL either performed or assessed with clinical judgement   ADL Overall ADL's : Needs assistance/impaired                                       General ADL Comments: SETUP self-feeding seated in bed - pt required MIN VCs for rest breaks/PLB while cutting food. SpO2 decreased to 89% on 2L [place] while attempting to cut sausage patty, resolved c PLB and rest breaks. MAX A B socks.      Vision Baseline Vision/History: Wears glasses Wears Glasses: At all times       Perception     Praxis      Pertinent Vitals/Pain Pain Assessment: No/denies pain     Hand Dominance Right   Extremity/Trunk Assessment Upper Extremity Assessment Upper Extremity Assessment: Generalized weakness(B grip 4/5. Poor fine motor / bimanual integration)  Lower Extremity Assessment Lower Extremity Assessment: Generalized weakness(SLR 4+/5 at bed level, unable to perform heel slides)       Communication Communication Communication: Other (comment)(Mumbled speech)   Cognition Arousal/Alertness: Awake/alert Behavior During Therapy: WFL for tasks assessed/performed Overall Cognitive Status: Impaired/Different from baseline Area of Impairment: Following commands;Safety/judgement                       Following Commands: Follows one step commands inconsistently Safety/Judgement: Decreased awareness of safety     General Comments: OT handed pt straw to use in coffee and pt proceeded to use straw to stir butter. Pt unable to state what his intentions were or  recognize that this was atypical use of straw. Limited command following t/o.    General Comments       Exercises Exercises: Other exercises Other Exercises Other Exercises: Pt educated re: falls prevention, OT role, discharge recommendations, energy conservation strategies, PLB, importance of rest breaks Other Exercises: Bed mobility/positioning for self-feeding, setup for meal, PLB, LBD, IS frequency/use   Shoulder Instructions      Home Living Family/patient expects to be discharged to:: Private residence Living Arrangements: Spouse/significant other Available Help at Discharge: Family Type of Home: House Home Access: Stairs to enter Secretary/administrator of Steps: 3 Entrance Stairs-Rails: Right Home Layout: One level     Bathroom Shower/Tub: Producer, television/film/video: Standard Bathroom Accessibility: Yes How Accessible: Accessible via walker Home Equipment: Walker - 2 wheels;Cane - single point;Shower seat;Bedside commode;Hospital bed;Wheelchair - manual          Prior Functioning/Environment Level of Independence: Independent with assistive device(s)        Comments: Questionable historian        OT Problem List: Decreased strength;Decreased range of motion;Decreased activity tolerance;Decreased coordination;Decreased safety awareness;Decreased knowledge of use of DME or AE      OT Treatment/Interventions: Self-care/ADL training;Therapeutic exercise;Neuromuscular education;Energy conservation;DME and/or AE instruction;Therapeutic activities;Cognitive remediation/compensation;Patient/family education;Balance training    OT Goals(Current goals can be found in the care plan section) Acute Rehab OT Goals Patient Stated Goal: to go home OT Goal Formulation: With patient Time For Goal Achievement: 01/14/20 Potential to Achieve Goals: Fair ADL Goals Pt Will Perform Eating: Independently;sitting(c AE PRN) Pt Will Perform Lower Body Dressing: with mod  assist;sitting/lateral leans;with caregiver independent in assisting(c LRAD & AE PRN) Pt Will Transfer to Toilet: with max assist;bedside commode;stand pivot transfer  OT Frequency: Min 1X/week   Barriers to D/C: Inaccessible home environment;Decreased caregiver support          Co-evaluation              AM-PAC OT "6 Clicks" Daily Activity     Outcome Measure Help from another person eating meals?: A Little Help from another person taking care of personal grooming?: A Little Help from another person toileting, which includes using toliet, bedpan, or urinal?: A Lot Help from another person bathing (including washing, rinsing, drying)?: A Lot Help from another person to put on and taking off regular upper body clothing?: A Little Help from another person to put on and taking off regular lower body clothing?: A Lot 6 Click Score: 15   End of Session Equipment Utilized During Treatment: Oxygen(2L [place])  Activity Tolerance: Patient limited by fatigue(Pt limited by poor command following/safety awareness) Patient left: in bed;with call bell/phone within reach;with bed alarm set  OT Visit Diagnosis: Unsteadiness on feet (R26.81);Other abnormalities of gait and mobility (R26.89);Other symptoms and signs involving cognitive  function                Time: 0802-0830 OT Time Calculation (min): 28 min Charges:  OT General Charges $OT Visit: 1 Visit OT Evaluation $OT Eval Moderate Complexity: 1 Mod OT Treatments $Self Care/Home Management : 23-37 mins  Dessie Coma, M.S. OTR/L  12/31/19, 11:47 AM

## 2019-12-31 NOTE — Progress Notes (Signed)
PROGRESS NOTE    Gary Thomas  NWG:[id number] DOB: [birth year] DOA: 12/29/2019 PCP: Marguarite Arbour, MD    Brief Narrative:  Gary Thomas is a 86 y.o. male with medical history significant of Gary Thomas a 86 y.o.malewith a past medical history of diabetes, gastric reflux, hypertension, CVA, sleep apnea, chronic PE, COPD, presents to the emergency department for shortness of breath.. Patient has shortness of breath for the past couple of months but last couple of days started having sneezing with cough and feeling congested.  His shortness of breath has worsened.  Denies fever, chills, chest pain.  Feels his shortness of breath is worse when he lays down due to the congestion. Was discharged from the hospital last month when he was admitted for sepsis and community-acquired pneumonia with acute hypoxemic respiratory failure.  He was discharged with Augmentin. ED Course:EMS report bil wheezing, RA sat 85%, improved to 99% on 2L after x1 duoneb. RA sat 89% on arrival.  Was started on azithromycin and ceftriaxone with IV Solu-Medrol. BNP was 104 less than before.  Potassium 5.3, creatinine 1.42 with WBC of 13.2. covid neg. ER ordered CTA chest pending    Consultants:  NONE  Procedures:  CT 1. Persistent consolidation in the MEDIAL RIGHT LOWER lobe, slightly improved. 2. Patchy ground-glass opacity in the superior segment of the LEFT LOWER lobe is slightly less dense but otherwise unchanged. Recommend follow-up chest CT in 6 months. 3. Stable elevation of RIGHT hemidiaphragm. 4. Coronary artery disease. 5. Numerous hepatic cysts. 6. Colonic diverticulosis. 7. Pulmonary artery enlargement consistent with pulmonary arterial hypertension. 8. Aortic Atherosclerosis (ICD10-I70.0).  Antimicrobials:   Cefepime, vanco   Subjective: No change in sob, laying in bed. No cp. No new complaints.  Objective: Vitals:   12/30/19 2024 12/31/19 0438 12/31/19 0820 12/31/19  1204  BP: (!) 143/71 (!) 152/78 (!) 160/75 (!) 143/64  Pulse: 88 77 78 83  Resp: 16 15 19 18   Temp: 98.9 F (37.2 C) 98.1 F (36.7 C) 97.8 F (36.6 C) 97.8 F (36.6 C)  TempSrc: Oral Oral Oral Oral  SpO2: 98% 95% 98% 97%  Weight:  98.4 kg    Height:        Intake/Output Summary (Last 24 hours) at 12/31/2019 1437 Last data filed at 12/31/2019 0445 Gross per 24 hour  Intake 435.01 ml  Output 875 ml  Net -439.99 ml   Filed Weights   12/29/19 2113 12/30/19 0336 12/31/19 0438  Weight: 98.4 kg 96.2 kg 98.4 kg    Examination:  General exam: Appears calm and comfortable, sitting in bed, eating Respiratory system: Clear to auscultation. Respiratory effort normal.no wheezing Cardiovascular system: S1 & S2 heard, RRR. No JVD, murmurs, rubs, gallops or clicks.  Gastrointestinal system: Abdomen is nondistended, soft and nontender. Normal bowel sounds heard. Central nervous system: Alert and oriented. No focal neurological deficits. Extremities: no edema Skin: warm, dry Psychiatry: Judgement and insight appear normal. Mood & affect appropriate.     Data Reviewed: I have personally reviewed following labs and imaging studies  CBC: Recent Labs  Lab 12/29/19 1309 12/29/19 2113  WBC 13.2* 14.3*  HGB 15.1 14.1  HCT 47.4 44.4  MCV 93.3 93.7  PLT 151 145*   Basic Metabolic Panel: Recent Labs  Lab 12/29/19 1309 12/30/19 0547 12/30/19 1034 12/30/19 1425 12/31/19 0548  NA 142 140  --   --  137  K 5.3* 5.7* 6.3* 5.2* 5.0  CL 108 111  --   --  107  CO2 26 22  --   --  25  GLUCOSE 117* 136*  --   --  117*  BUN 45* 45*  --   --  50*  CREATININE 1.42* 1.58*  --   --  1.56*  CALCIUM 9.4 9.4  --   --  9.1   GFR: Estimated Creatinine Clearance: 40.1 mL/min (A) (by C-G formula based on SCr of 1.56 mg/dL (H)). Liver Function Tests: Recent Labs  Lab 12/29/19 1309  AST 16  ALT 14  ALKPHOS 78  BILITOT 0.8  PROT 7.0  ALBUMIN 3.8   No results for input(s): LIPASE, AMYLASE in  the last 168 hours. No results for input(s): AMMONIA in the last 168 hours. Coagulation Profile: No results for input(s): INR, PROTIME in the last 168 hours. Cardiac Enzymes: No results for input(s): CKTOTAL, CKMB, CKMBINDEX, TROPONINI in the last 168 hours. BNP (last 3 results) No results for input(s): PROBNP in the last 8760 hours. HbA1C: Recent Labs    12/29/19 2113  HGBA1C 5.9*   CBG: Recent Labs  Lab 12/30/19 2037 12/31/19 0008 12/31/19 0436 12/31/19 0821 12/31/19 1205  GLUCAP 136* 112* 109* 87 84   Lipid Profile: No results for input(s): CHOL, HDL, LDLCALC, TRIG, CHOLHDL, LDLDIRECT in the last 72 hours. Thyroid Function Tests: No results for input(s): TSH, T4TOTAL, FREET4, T3FREE, THYROIDAB in the last 72 hours. Anemia Panel: No results for input(s): VITAMINB12, FOLATE, FERRITIN, TIBC, IRON, RETICCTPCT in the last 72 hours. Sepsis Labs: No results for input(s): PROCALCITON, LATICACIDVEN in the last 168 hours.  Recent Results (from the past 240 hour(s))  MRSA PCR Screening     Status: Abnormal   Collection Time: 12/29/19  3:34 PM   Specimen: Nasal Mucosa; Nasopharyngeal  Result Value Ref Range Status   MRSA by PCR POSITIVE (A) NEGATIVE Final    Comment:        The GeneXpert MRSA Assay (FDA approved for NASAL specimens only), is one component of a comprehensive MRSA colonization surveillance program. It is not intended to diagnose MRSA infection nor to guide or monitor treatment for MRSA infections. RESULT CALLED TO, READ BACK BY AND VERIFIED WITH: Sande Brothers RN AT 1422 ON 12/30/19 SNG Performed at [facility], [address] [place], [place] [address]   SARS CORONAVIRUS 2 (TAT 6-24 HRS) Nasopharyngeal Nasopharyngeal Swab     Status: None   Collection Time: 12/29/19  3:53 PM   Specimen: Nasopharyngeal Swab  Result Value Ref Range Status   SARS Coronavirus 2 NEGATIVE NEGATIVE Final    Comment: (NOTE) SARS-CoV-2 target nucleic acids are NOT  DETECTED. The SARS-CoV-2 RNA is generally detectable in upper and lower respiratory specimens during the acute phase of infection. Negative results do not preclude SARS-CoV-2 infection, do not rule out co-infections with other pathogens, and should not be used as the sole basis for treatment or other patient management decisions. Negative results must be combined with clinical observations, patient history, and epidemiological information. The expected result is Negative. Fact Sheet for Patients: HairSlick.no Fact Sheet for Healthcare Providers: quierodirigir.com This test is not yet approved or cleared by the [place] FDA and  has been authorized for detection and/or diagnosis of SARS-CoV-2 by FDA under an Emergency Use Authorization (EUA). This EUA will remain  in effect (meaning this test can be used) for the duration of the COVID-19 declaration under Section 56 4(b)(1) of the Act, 21 U.S.C. section 360bbb-3(b)(1), unless the authorization is terminated or revoked sooner. Performed  at [facility], [place] [address], [place], [place] [address]          Radiology Studies: CT Chest Wo Contrast  Result Date: 12/29/2019 CLINICAL DATA:  Hypoxemia. Shortness of breath started today. Bilateral wheezing. Low oxygen sats. History of COPD. EXAM: CT CHEST WITHOUT CONTRAST TECHNIQUE: Multidetector CT imaging of the chest was performed following the standard protocol without IV contrast. COMPARISON:  CT angio chest on 11/17/2019, chest x-ray today FINDINGS: Cardiovascular: Heart size is normal. No pericardial effusion. There is dense atherosclerotic calcification of the coronary arteries. There is atherosclerotic calcification of the thoracic aorta not associated with aneurysm. Stable dilatation of the pulmonary artery. Mediastinum/Nodes: The esophagus is unremarkable. No significant mediastinal, hilar, or axillary adenopathy. LEFT  thyroid nodule is 1.3 centimeters and associated with punctate calcifications. Subcentimeter partially calcified RIGHT thyroid nodule is also present, both stable in appearance. No followup recommended (ref: J Am Coll Radiol. 2015 Feb;12(2): 143-50). Lungs/Pleura: Persistent consolidation in the MEDIAL RIGHT LOWER lobe has improved slightly. Patchy ground-glass opacities in the superior segment of the LEFT LOWER lobe is slightly less dense but persists. No pleural effusions. Airways are patent. Upper Abdomen: Numerous hepatic cysts. Stable elevation of RIGHT hemidiaphragm. Colonic diverticulosis. Musculoskeletal: Degenerative changes are present in the thoracic spine. No suspicious lytic or blastic lesions are identified. IMPRESSION: 1. Persistent consolidation in the MEDIAL RIGHT LOWER lobe, slightly improved. 2. Patchy ground-glass opacity in the superior segment of the LEFT LOWER lobe is slightly less dense but otherwise unchanged. Recommend follow-up chest CT in 6 months. 3. Stable elevation of RIGHT hemidiaphragm. 4. Coronary artery disease. 5. Numerous hepatic cysts. 6. Colonic diverticulosis. 7. Pulmonary artery enlargement consistent with pulmonary arterial hypertension. 8. Aortic Atherosclerosis (ICD10-I70.0). Electronically Signed   By: Nolon Nations M.D.   On: 12/29/2019 15:22        Scheduled Meds: . amLODipine  5 mg Oral Daily  . atorvastatin  20 mg Oral QHS  . cholecalciferol  4,000 Units Oral Daily  . enoxaparin (LOVENOX) injection  40 mg Subcutaneous Q24H  . fluticasone furoate-vilanterol  1 puff Inhalation Daily  . insulin aspart  0-15 Units Subcutaneous Q4H  . mirtazapine  15 mg Oral QHS  . multivitamin-lutein  1 capsule Oral Daily  . pantoprazole  40 mg Oral Daily  . predniSONE  40 mg Oral Q breakfast  . sertraline  100 mg Oral QHS  . sodium chloride flush  3 mL Intravenous Q12H   Continuous Infusions: . sodium chloride Stopped (12/31/19 0000)  . ceFEPime (MAXIPIME) IV 2  g (12/31/19 1051)  . vancomycin Stopped (12/30/19 1806)    Assessment & Plan:   Active Problems:   Pneumonia   HCAP (healthcare-associated pneumonia)   1.HCAP- admitted with PNA last month. continue iv vanco and cefepime CT done , results above  2.Acute hypoxemic respiratory failure- hypoxic in 80's on admission. cxr as above Treat pna Incentive spirometr. Given lasix 40mg  iv x1  2. IDDM- hold home meds RISS Ck fs Hypoglycemic protocal  3.acute copd exacerbation- Iv steroids..>start po taper duonebs  4.CKD stage IIb- at baseline Monitor  5.Hyperkalemia-  Place on low k diet Lasix 40mg  iv x1 and kayexal 30gm given yesterday Has improved, but K at 5 will give kayex 30gm po x1.   6.hypertension- mildly elevated Will increase amlodipine to 10mg  daily    DVT prophylaxis: lovenox Code Status: full Family Communication: wife at bedside , updated Disposition Plan:PT rec. SNF..>>pt refused, will go back home with [facility]  Barrier: hyperkalemic, still needs treatment for PNA as pt unable to take po abx since respiratory status not even close to baseline. Possible d/c in 1-2 days if he improves .        LOS: 1 day   Time spent: 45 min with >50 % coc    Lynn Ito, MD [facility] Pager [phone]  If 7PM-7AM, please contact night-coverage [email or web address] Password [facility] 12/31/2019, 2:37 PM Patient ID: TIELER COURNOYER, male   DOB: 01-25-1932, 86 y.o.   MRN: [id number]

## 2020-01-01 DIAGNOSIS — J441 Chronic obstructive pulmonary disease with (acute) exacerbation: Secondary | ICD-10-CM

## 2020-01-01 DIAGNOSIS — E876 Hypokalemia: Secondary | ICD-10-CM

## 2020-01-01 LAB — URINALYSIS, COMPLETE (UACMP) WITH MICROSCOPIC
Bacteria, UA: NONE SEEN
Bilirubin Urine: NEGATIVE
Glucose, UA: NEGATIVE mg/dL
Hgb urine dipstick: NEGATIVE
Ketones, ur: NEGATIVE mg/dL
Leukocytes,Ua: NEGATIVE
Nitrite: NEGATIVE
Protein, ur: 30 mg/dL — AB
Specific Gravity, Urine: 1.018 (ref 1.005–1.030)
pH: 6 (ref 5.0–8.0)

## 2020-01-01 LAB — GLUCOSE, CAPILLARY
Glucose-Capillary: 125 mg/dL — ABNORMAL HIGH (ref 70–99)
Glucose-Capillary: 165 mg/dL — ABNORMAL HIGH (ref 70–99)
Glucose-Capillary: 199 mg/dL — ABNORMAL HIGH (ref 70–99)
Glucose-Capillary: 184 mg/dL — ABNORMAL HIGH (ref 70–99)

## 2020-01-01 LAB — BASIC METABOLIC PANEL
Anion gap: 8 (ref 5–15)
BUN: 44 mg/dL — ABNORMAL HIGH (ref 8–23)
CO2: 26 mmol/L (ref 22–32)
Calcium: 9.2 mg/dL (ref 8.9–10.3)
Chloride: 108 mmol/L (ref 98–111)
Creatinine, Ser: 1.36 mg/dL — ABNORMAL HIGH (ref 0.61–1.24)
GFR calc Af Amer: 55 mL/min — ABNORMAL LOW (ref >60)
GFR calc non Af Amer: 47 mL/min — ABNORMAL LOW (ref >60)
Glucose, Bld: 186 mg/dL — ABNORMAL HIGH (ref 70–99)
Potassium: 4.3 mmol/L (ref 3.5–5.1)
Sodium: 142 mmol/L (ref 135–145)

## 2020-01-01 MED ORDER — NEPRO/CARBSTEADY PO LIQD
237.0000 mL | Freq: Two times a day (BID) | ORAL | Status: DC
  Administered 2020-01-02 (×2): 237 mL via ORAL

## 2020-01-01 MED ORDER — MUPIROCIN 2 % EX OINT
1.0000 "application " | TOPICAL_OINTMENT | Freq: Two times a day (BID) | CUTANEOUS | Status: DC
  Administered 2020-01-01 – 2020-01-02 (×4): 1 via NASAL
  Filled 2020-01-01: qty 22

## 2020-01-01 MED ORDER — PREDNISONE 20 MG PO TABS
20.0000 mg | ORAL_TABLET | Freq: Every day | ORAL | Status: DC
  Administered 2020-01-02: 20 mg via ORAL
  Filled 2020-01-01: qty 1

## 2020-01-01 MED ORDER — CHLORHEXIDINE GLUCONATE CLOTH 2 % EX PADS
6.0000 | MEDICATED_PAD | Freq: Every day | CUTANEOUS | Status: DC
  Administered 2020-01-01 – 2020-01-02 (×2): 6 via TOPICAL

## 2020-01-01 NOTE — Evaluation (Signed)
Clinical/Bedside Swallow Evaluation Patient Details  Name: Gary Thomas MRN: [id number] Date of Birth: [birth year]  Today's Date: 01/01/2020 Time: SLP Start Time (ACUTE ONLY): 0930 SLP Stop Time (ACUTE ONLY): 1025 SLP Time Calculation (min) (ACUTE ONLY): 55 min  Past Medical History:  Past Medical History:  Diagnosis Date  . Depression   . Diabetes mellitus without complication ([place])   . Dyspnea   . Elevated lipids   . Femur fracture ([place]) 08/22/2018  . GERD (gastroesophageal reflux disease)   . Hypertension   . Pulmonary emboli ([place])   . Renal insufficiency    only has 1 kidney  . Sleep apnea   . Stroke ([place])    Rt side weakness   Past Surgical History:  Past Surgical History:  Procedure Laterality Date  . 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 AORTA SURGERY    . CAROTID ARTERY - SUBCLAVIAN ARTERY BYPASS GRAFT Bilateral   . CATARACT EXTRACTION W/ INTRAOCULAR LENS  IMPLANT, BILATERAL    . CHOLECYSTECTOMY    . EYE SURGERY    . FEMUR FRACTURE SURGERY Right   . HIP FRACTURE SURGERY Bilateral   . I & D EXTREMITY Right 10/18/2018   Procedure: IRRIGATION AND DEBRIDEMENT EXTREMITY, POSSIBLE HARDWARE REMOVAL OF SUPERCONDYLAR PLATE;  Surgeon: Corky Mull, MD;  Location: [facility] ORS;  Service: Orthopedics;  Laterality: Right;  . JOINT REPLACEMENT    . ORIF FEMUR FRACTURE Right 08/24/2018   Procedure: OPEN REDUCTION INTERNAL FIXATION (ORIF) SUPRACONDYLAR FEMUR FRACTURE;  Surgeon: Corky Mull, MD;  Location: [facility] ORS;  Service: Orthopedics;  Laterality: Right;  . ORIF FEMUR FRACTURE Right 10/18/2018   Procedure: HARDWARE REMOVAL- DISTAL FEMUR FRACTURE FIXATION;  Surgeon: Corky Mull, MD;  Location: [facility] ORS;  Service: Orthopedics;  Laterality: Right;  . PROSTATE SURGERY    . REPLACEMENT TOTAL KNEE Right   . TOTAL HIP ARTHROPLASTY Right 03/02/2017   Procedure: TOTAL HIP ARTHROPLASTY ANTERIOR APPROACH;  Surgeon: [name], MD;  Location: [facility]  ORS;  Service: Orthopedics;  Laterality: Right;  Marland Kitchen VASCULAR SURGERY     HPI:  Pt is a 86 y.o. male with a past medical history significant for Multiple issues including diabetes, gastric Reflux, hypertension, CVA, sleep apnea, chronic PE, COPD, presents to the emergency department for shortness of breath.  According to the patient for the past 3 to 4 days he has been coughing with nasal congestion and feeling short of breath.  Patient has been wheezing.  Patient denies baseline O2 use.  Currently satting in the 80s on room air, mid 90s on 2 L.  Patient denies any chest pain.  States slight edema in his legs which is baseline.  Feels his shortness of breath is worse when he lays down due to the congestion.  Unsure of pt's Baseline Cognitive status but MRI in 2017 per chart indicated: "Moderate Atrophy and advanced chronic ischemic change" then. Pt was easily distracted during evaluation.   Assessment / Plan / Recommendation Clinical Impression  Pt appears to present w/ grossly adequate oropharyngeal phase swallow function w/ no neuromuscular deficits noted; pt appeared to present w/ min Cognitive decline easily distracted and needed verbal cues to redirect to task during po trials/meal. Pt wears a Top Denture plate, missing bottom dentition(molars) -- this can impact mastication of solids(meats). When given min support to sit fully upright and verbal instruction to follow general aspiration precautions, no overt clinical s/s of aspiration were noted. Pt consumed trials  of thin liquids, purees and soft solids w/ no overt coughing or decline in respiratory status; vocal quality clear b/t trials. Noted a mild throat clear x2 post swallowing as pt was often talking immediately following the drinking trials -- instruction given to Not Talk during/after po trials but instead focus on the po's and swallowing. Oral phase grossly wfl for bolus management of sips and bites. No oral residue noted; A-P transfer time  adequate. Min increased mastication time/effort noted w/ increased texture - missing molars. Given time, pt managed boluses adequately and cleared orally. Pt fed self w/ setup support but often used fingers to pick up the foods instead of fork presented. OM exam revealed no overt lingual/labial weakness or unilateral weakness. Belching noted - pt has a baseline of GERD per chart. ANY esophageal dysmotility can increase risk for retrograde activity and aspiration of Reflux material thus pulmonary impact. Also, pt's ease of distraction during oral intake can increase risk for an aspiration episode. Recommend a more Mech Soft diet consistency w/ thin liquids w/ general aspiration precautions; Pills Whole in Puree for safer swallowing and support to sit upright/forward during oral intake. Reduce distractions including Talking at meals. ST services can be available for any further needs while admitted. Aspiration precautions posted in room.  SLP Visit Diagnosis: Dysphagia, unspecified (R13.10)    Aspiration Risk  (reduced when following general precautions)    Diet Recommendation  Mech Soft diet (easy to masticate); Thin liquids. General aspiration precautions. REFLUX precautions secondary to GERD. Tray setup at meals -- reduce distractions including Talking at meals  Medication Administration: Whole meds with puree(for safer swallowing)    Other  Recommendations Recommended Consults: (Dietician f/u if needed) Oral Care Recommendations: Oral care BID;Oral care before and after PO;Patient independent with oral care;Staff/trained caregiver to provide oral care Other Recommendations: (n/a)   Follow up Recommendations None      Frequency and Duration (n/a)  (n/a)       Prognosis Prognosis for Safe Diet Advancement: Fair(-Good) Barriers to Reach Goals: Cognitive deficits;Time post onset;Severity of deficits;Behavior(min)      Swallow Study   General Date of Onset: 12/29/19 HPI: Pt is a 86 y.o.  male with a past medical history significant for Multiple issues including diabetes, gastric Reflux, hypertension, CVA, sleep apnea, chronic PE, COPD, presents to the emergency department for shortness of breath.  According to the patient for the past 3 to 4 days he has been coughing with nasal congestion and feeling short of breath.  Patient has been wheezing.  Patient denies baseline O2 use.  Currently satting in the 80s on room air, mid 90s on 2 L.  Patient denies any chest pain.  States slight edema in his legs which is baseline.  Feels his shortness of breath is worse when he lays down due to the congestion.  Unsure of pt's Baseline Cognitive status but MRI in 2017 per chart indicated: "Moderate Atrophy and advanced chronic ischemic change" then. Pt was easily distracted during evaluation. Type of Study: Bedside Swallow Evaluation Previous Swallow Assessment: none noted Diet Prior to this Study: Regular;Thin liquids Temperature Spikes Noted: No(wbc 14.3 on 12/29/2019) Respiratory Status: Nasal cannula(2L) History of Recent Intubation: No Behavior/Cognition: Alert;Cooperative;Pleasant mood;Confused;Distractible;Requires cueing(min+) Oral Cavity Assessment: Within Functional Limits Oral Care Completed by SLP: Yes Oral Cavity - Dentition: Dentures, top;Missing dentition(on Bottom(molars)) Vision: Functional for self-feeding Self-Feeding Abilities: Able to feed self;Needs assist;Needs set up Patient Positioning: Upright in bed(needed min support ) Baseline Vocal Quality: Normal Volitional Cough: Strong  Volitional Swallow: Able to elicit    Oral/Motor/Sensory Function Overall Oral Motor/Sensory Function: Within functional limits(no unilateral weakness noted)   Ice Chips Ice chips: Within functional limits Presentation: Spoon(fed; 2 trials)   Thin Liquid Thin Liquid: Within functional limits Presentation: Cup;Self Fed;Straw(5 trials via each) Other Comments: noted a mild throat clear post  swallowing as pt was talking immediately following the drinking trial    Nectar Thick Nectar Thick Liquid: Not tested   Honey Thick Honey Thick Liquid: Not tested   Puree Puree: Within functional limits Presentation: Self Fed;Spoon(8 ozs)   Solid     Solid: Impaired Presentation: Self Fed(4 trials) Oral Phase Impairments: Impaired mastication(missing bottom molars) Oral Phase Functional Implications: Impaired mastication(missing bottom molars) Pharyngeal Phase Impairments: (none) Other Comments: ate w/ his fingers despite offering of fork       Jerilynn Som, MS, CCC-SLP Shaeley Segall 01/01/2020,11:06 AM

## 2020-01-01 NOTE — Progress Notes (Signed)
Pt refusing to have blood work for labs drawn this morning.  Difficult to reorient at this time.

## 2020-01-01 NOTE — Progress Notes (Signed)
Patient did well sitting up on the edge of the bed with assistance. Did not qualify for oxygen as oxygen levels stayed above 93% during exertion.

## 2020-01-01 NOTE — Progress Notes (Signed)
Initial Nutrition Assessment  DOCUMENTATION CODES:   Not applicable  INTERVENTION:  Provide Nepro Shake po BID, each supplement provides 425 kcal and 19 grams protein.  Provided "Potassium Content of Foods" handout from the Academy of [employer] with patient and wife. Reviewed low potassium diet. Discussed foods that are high in potassium to limit in diet.  NUTRITION DIAGNOSIS:   Inadequate oral intake related to decreased appetite as evidenced by per patient/family report.  GOAL:   Patient will meet greater than or equal to 90% of their needs  MONITOR:   PO intake, Supplement acceptance, Labs, Weight trends, I & O's  REASON FOR ASSESSMENT:   Malnutrition Screening Tool    ASSESSMENT:   86 year old male with PMHx of pulmonary emboli, HTN, HLD, depression, DM, GERD, renal insufficiency (solitary kidney), hx urostomy placement, hx CVA, sleep apnea admitted with HCAP, acute COPD exacerbation, hyperkalemia.   Met with patient and wife at bedside. Patient was somewhat confused but wife able to provide history when patient confused. Patient has had a decreased appetite for a while now related to his PNA. He has been eating small amounts at meals. For breakfast he may have yogurt and a Glucerna. For lunch he may only have hot chocolate. Dinner he only has bites of a full dinner or at most 50% of a sandwich. At home her drinks strawberry or chocolate Glucerna. Patient and wife were not aware they had been ordered for low potassium diet. Reviewed low potassium diet with patient and wife. Patient is amenable to drinking a low potassium oral nutrition supplement at this time.  Patient reports his UBW was 247 lbs and he has lost 40 lbs. According to weight history in chart patient was 99.8 kg on 02/07/2019, 104.3 kg on 07/31/2019, 97.1 kg on 10/25/2019, 99.8 kg on 11/20/2019 and 96.5 kg on 3/22. Weight tends to fluctuate. He has lost 3.3 kg (3.3% body weight) over the past 1.5 months,  which is not significant for time frame.  Medications reviewed and include: vitamin D 4000 units daily, Novolog 0-15 units TID, Novolog 0-5 units QHS, Ocuvite, Remeron 15 mg QHS, pantoprazole, prednisone 40 mg daily, sertraline, cefepime, vancomycin.  Labs reviewed: CBG 125-165, BUN 44, Creatinine 1.36.  NUTRITION - FOCUSED PHYSICAL EXAM:    Most Recent Value  Orbital Region  No depletion  Upper Arm Region  Mild depletion  Thoracic and Lumbar Region  No depletion  Buccal Region  No depletion  Temple Region  Mild depletion  Clavicle Bone Region  Mild depletion  Clavicle and Acromion Bone Region  Mild depletion  Scapular Bone Region  No depletion  Dorsal Hand  Mild depletion  Patellar Region  No depletion  Anterior Thigh Region  No depletion  Posterior Calf Region  No depletion  Edema (RD Assessment)  None  Hair  Reviewed  Eyes  Reviewed  Mouth  Reviewed  Skin  Reviewed  Nails  Reviewed     Diet Order:   Diet Order            Diet heart healthy/carb modified Room service appropriate? Yes with Assist; Fluid consistency: Thin; Fluid restriction: Other (see comments)  Diet effective now             EDUCATION NEEDS:   Education needs have been addressed  Skin:  Skin Assessment: Reviewed RN Assessment  Last BM:  01/01/2020 small type 6  Height:   Ht Readings from Last 1 Encounters:  12/29/19 '5\' 9"'$  (1.753 m)  Weight:   Wt Readings from Last 1 Encounters:  01/01/20 96.5 kg   Ideal Body Weight:  72.7 kg  BMI:  Body mass index is 31.41 kg/m.  Estimated Nutritional Needs:   Kcal:  2000-2200  Protein:  100-110 grams  Fluid:  2 L/day  Jacklynn Barnacle, MS, RD, LDN Pager number available on Amion

## 2020-01-01 NOTE — Consult Note (Signed)
Pharmacy Antibiotic Note  Gary Thomas is a 86 y.o. male admitted on 12/29/2019 with pneumonia and cellulitis.  Pharmacy has been consulted for Vancomycin and Cefepime dosing.  Plan: 1) Will continue Vancomycin 1000mg  q24h Goal AUC 400-550. Expected AUC: 440-634 (borderline BMI of 31.4) Prior dose was based on Vd of 0.7 L/kg (BMI >30) SCr used: 1.56 mg/dL   Will order new Scr for tomorrow to monitor renal function.   2) Continue Cefepime 2g Q12 hours  Height: 5\' 9"  (175.3 cm) Weight: 212 lb 11.2 oz (96.5 kg) IBW/kg (Calculated) : 70.7  Temp (24hrs), Avg:97.9 F (36.6 C), Min:97.5 F (36.4 C), Max:98.6 F (37 C)  Recent Labs  Lab 12/29/19 1309 12/29/19 2113 12/30/19 0547 12/31/19 0548  WBC 13.2* 14.3*  --   --   CREATININE 1.42*  --  1.58* 1.56*    Estimated Creatinine Clearance: 39.7 mL/min (A) (by C-G formula based on SCr of 1.56 mg/dL (H)).    Allergies  Allergen Reactions  . Codeine Anaphylaxis  . Morphine Anaphylaxis  . Gabapentin Other (See Comments)    Unsure of what the reaction was   . Stadol [Butorphanol]     Unknown  . Lyrica [Pregabalin] Rash  . Tapentadol Nausea Only    ONSET 01/04/2013  . Tramadol     Hallucinations "I see giant spiders on ceiling". Pt also believes he is in [facility], but he is in 01/06/2013.    Antimicrobials this admission: Vancomycin 3/19 >>  Cefepime 3/19 >>   Microbiology results: 3/19 UCx: pending  3/19 MRSA PCR: positive COVID NEG  Thank you for allowing pharmacy to be a part of this patient's care.  4/19, PharmD Clinical Pharmacist 01/01/2020 8:45 AM

## 2020-01-01 NOTE — TOC Initial Note (Signed)
Transition of Care [facility]) - Initial/Assessment Note    Patient Details  Name: Gary Thomas MRN: [id number] Date of Birth: September 26, 1932  Transition of Care [facility]) CM/SW Contact:    Victorino Dike, RN Phone Number: 01/01/2020, 2:20 PM  Clinical Narrative:                   Met with patient and spouse today in room.  Patient is alert and agreeable to SNF for short term rehab.  He acknowledges need for rehab to return home safely with his wife.  He also acknowledges she is unable to completely care for him by herself.  PASSR, FL2 completed and bed search initiated.   Expected Discharge Plan: Skilled Nursing Facility Barriers to Discharge: Barriers Resolved, Continued Medical Work up   Patient Goals and CMS Choice Patient states their goals for this hospitalization and ongoing recovery are:: To get stronger and return hoe with wife. CMS Medicare.gov Compare Post Acute Care list provided to:: Patient Choice offered to / list presented to : Patient, Spouse  Expected Discharge Plan and Services Expected Discharge Plan: [place]   Discharge Planning Services: CM Consult Post Acute Care Choice: [place] Living arrangements for the past 2 months: Single Family Home                                      Prior Living Arrangements/Services Living arrangements for the past 2 months: Single Family Home Lives with:: Self, Spouse Patient language and need for interpreter reviewed:: Yes Do you feel safe going back to the place where you live?: Yes      Need for Family Participation in Patient Care: Yes (Comment) Care giver support system in place?: Yes (comment)   Criminal Activity/Legal Involvement Pertinent to Current Situation/Hospitalization: No - Comment as needed  Activities of Daily Living Home Assistive Devices/Equipment: Dentures (specify type), Eyeglasses, Oxygen, Wheelchair ADL Screening (condition at time of admission) Patient's  cognitive ability adequate to safely complete daily activities?: Yes Is the patient deaf or have difficulty hearing?: No Does the patient have difficulty seeing, even when wearing glasses/contacts?: No Does the patient have difficulty concentrating, remembering, or making decisions?: No Patient able to express need for assistance with ADLs?: Yes Does the patient have difficulty dressing or bathing?: Yes Independently performs ADLs?: No Communication: Independent Dressing (OT): Independent Grooming: Independent Feeding: Independent Bathing: Needs assistance Is this a change from baseline?: Pre-admission baseline Toileting: Dependent Is this a change from baseline?: Pre-admission baseline In/Out Bed: Dependent Is this a change from baseline?: Pre-admission baseline Walks in Home: Dependent Is this a change from baseline?: Pre-admission baseline Does the patient have difficulty walking or climbing stairs?: Yes Weakness of Legs: Both Weakness of Arms/Hands: Both  Permission Sought/Granted                  Emotional Assessment Appearance:: Appears stated age, Well-Groomed Attitude/Demeanor/Rapport: Engaged, Self-Confident Affect (typically observed): Accepting Orientation: : Oriented to Self, Oriented to Place, Oriented to Situation Alcohol / Substance Use: Not Applicable Psych Involvement: No (comment)  Admission diagnosis:  Pneumonia [J18.9] COPD exacerbation ([place]) [J44.1] Community acquired pneumonia of right lower lobe of lung [J18.9] HCAP (healthcare-associated pneumonia) [J18.9] Patient Active Problem List   Diagnosis Date Noted  . HCAP (healthcare-associated pneumonia) 12/30/2019  . Pneumonia 12/29/2019  . Community acquired pneumonia 11/17/2019  . Acute hypoxemic respiratory failure ([place]) 11/17/2019  . GI  bleed 02/07/2019  . Postoperative wound infection 10/17/2018  . Supracondylar fracture of distal end of femur without intracondylar extension ([place]) 10/17/2018   . Sepsis ([place]) 10/06/2018  . Chronic pulmonary embolism ([place]) 09/06/2018  . Primary osteoarthritis of right knee 09/06/2018  . Status post right knee replacement 09/06/2018  . Dyslipidemia associated with type 2 diabetes mellitus ([place]) 09/06/2018  . CKD stage 3 due to type 2 diabetes mellitus ([place]) 09/06/2018  . Controlled type 2 diabetes mellitus with stage 3 chronic kidney disease, with long-term current use of insulin ([place]) 09/06/2018  . Chronic cerebrovascular accident (CVA) 09/06/2018  . GERD without esophagitis 09/06/2018  . Closed nondisplaced supracondylar fracture of distal end of femur without intracondylar extension ([place]) 09/06/2018  . Acute on chronic anemia 09/06/2018  . Depression, major, single episode, in partial remission ([place]) 09/06/2018  . Bilateral lower extremity edema 09/06/2018  . Chronic gout due to renal impairment 09/06/2018  . COPD with respiratory failure, acute ([place]) 08/28/2018  . Spondylolisthesis of lumbar region 02/15/2018  . PAD (peripheral artery disease) ([place]) 08/19/2017  . Hyperlipidemia 07/05/2017  . Essential hypertension 07/05/2017  . Bilateral carotid artery stenosis 07/05/2017  . Primary localized osteoarthritis of right hip 03/02/2017   PCP:  Idelle Crouch, MD Pharmacy:   [id number] [id number]: [phone]: [phone] [address] [place] [address]Phone: [phone]: [phone]    Social Determinants of Health (SDOH) Interventions    Readmission Risk Interventions Readmission Risk Prevention Plan 02/10/2019  Transportation Screening Complete  Medication Review ([place] Complete  PCP or Specialist appointment within 3-5 days of discharge Complete  HRI or HHudsonComplete  SW Recovery Care/Counseling Consult Complete  [place]svillePatient Refused  Some recent data might be hidden

## 2020-01-01 NOTE — [place] FL2 (Signed)
[place] MEDICAID FL2 LEVEL OF CARE SCREENING TOOL     IDENTIFICATION  Patient Name: Gary Thomas Birthdate: 1932-10-03 Sex: male Admission Date (Current Location): 12/29/2019  [place] and [place] Number:  Chiropodist and Address:  [facility], [address], [place], [place] [address]      Provider Number: [id number]  Attending Physician Name and Address:  Lynn Ito, MD  Relative Name and Phone Number:  Durwin Davisson wife [phone]    Current Level of Care: Hospital Recommended Level of Care: Skilled Nursing Facility Prior Approval Number:    Date Approved/Denied:   PASRR Number: [id number]  Discharge Plan: SNF    Current Diagnoses: Patient Active Problem List   Diagnosis Date Noted  . HCAP (healthcare-associated pneumonia) 12/30/2019  . Pneumonia 12/29/2019  . Community acquired pneumonia 11/17/2019  . Acute hypoxemic respiratory failure (HCC) 11/17/2019  . GI bleed 02/07/2019  . Postoperative wound infection 10/17/2018  . Supracondylar fracture of distal end of femur without intracondylar extension (HCC) 10/17/2018  . Sepsis (HCC) 10/06/2018  . Chronic pulmonary embolism (HCC) 09/06/2018  . Primary osteoarthritis of right knee 09/06/2018  . Status post right knee replacement 09/06/2018  . Dyslipidemia associated with type 2 diabetes mellitus (HCC) 09/06/2018  . CKD stage 3 due to type 2 diabetes mellitus (HCC) 09/06/2018  . Controlled type 2 diabetes mellitus with stage 3 chronic kidney disease, with long-term current use of insulin (HCC) 09/06/2018  . Chronic cerebrovascular accident (CVA) 09/06/2018  . GERD without esophagitis 09/06/2018  . Closed nondisplaced supracondylar fracture of distal end of femur without intracondylar extension (HCC) 09/06/2018  . Acute on chronic anemia 09/06/2018  . Depression, major, single episode, in partial remission (HCC) 09/06/2018  . Bilateral lower extremity edema 09/06/2018  .  Chronic gout due to renal impairment 09/06/2018  . COPD with respiratory failure, acute (HCC) 08/28/2018  . Spondylolisthesis of lumbar region 02/15/2018  . PAD (peripheral artery disease) (HCC) 08/19/2017  . Hyperlipidemia 07/05/2017  . Essential hypertension 07/05/2017  . Bilateral carotid artery stenosis 07/05/2017  . Primary localized osteoarthritis of right hip 03/02/2017    Orientation RESPIRATION BLADDER Height & Weight     Self, Situation, Place  Normal Incontinent Weight: 96.5 kg Height:  5\' 9"  (175.3 cm)  BEHAVIORAL SYMPTOMS/MOOD NEUROLOGICAL BOWEL NUTRITION STATUS      Incontinent Diet(mechanical soft)  AMBULATORY STATUS COMMUNICATION OF NEEDS Skin   Extensive Assist Verbally Normal                       Personal Care Assistance Level of Assistance  Bathing, Dressing Bathing Assistance: Limited assistance   Dressing Assistance: Maximum assistance     Functional Limitations Info  Sight Sight Info: Adequate(wears glasses)        SPECIAL CARE FACTORS FREQUENCY  PT (By licensed PT), OT (By licensed OT), Speech therapy     PT Frequency: 5x a day OT Frequency: 5x a day     Speech Therapy Frequency: 5x a day      Contractures Contractures Info: Not present    Additional Factors Info  Code Status, Allergies Code Status Info: Full Allergies Info: codeine, morphine, gabapentin, stadol, lyrica, tapendtado, tramadol           Current Medications (01/01/2020):  This is the current hospital active medication list Current Facility-Administered Medications  Medication Dose Route Frequency Provider Last Rate Last Admin  . 0.9 %  sodium chloride infusion   Intravenous PRN Amery,  Gwynneth Albright, MD   Stopped at 01/01/20 1030  . acetaminophen (TYLENOL) tablet 650 mg  650 mg Oral Q6H PRN Nolberto Hanlon, MD      . albuterol (PROVENTIL) (2.5 MG/3ML) 0.083% nebulizer solution 2.5 mg  2.5 mg Nebulization Q2H PRN Nolberto Hanlon, MD      . amLODipine (NORVASC) tablet 10 mg  10 mg  Oral Daily Nolberto Hanlon, MD   10 mg at 01/01/20 0859  . atorvastatin (LIPITOR) tablet 20 mg  20 mg Oral QHS Nolberto Hanlon, MD   20 mg at 12/31/19 2240  . ceFEPIme (MAXIPIME) 2 g in sodium chloride 0.9 % 100 mL IVPB  2 g Intravenous Q12H Pearla Dubonnet, RPH   Stopped at 01/01/20 6073  . Chlorhexidine Gluconate Cloth 2 % PADS 6 each  6 each Topical Q0600 Nolberto Hanlon, MD   6 each at 01/01/20 0859  . cholecalciferol (VITAMIN D) tablet 4,000 Units  4,000 Units Oral Daily Nolberto Hanlon, MD   4,000 Units at 01/01/20 [phone]  . enoxaparin (LOVENOX) injection 40 mg  40 mg Subcutaneous Q24H Nolberto Hanlon, MD   40 mg at 12/31/19 2240  . fluticasone furoate-vilanterol (BREO ELLIPTA) 100-25 MCG/INH 1 puff  1 puff Inhalation Daily Nolberto Hanlon, MD   1 puff at 01/01/20 1025  . insulin aspart (novoLOG) injection 0-15 Units  0-15 Units Subcutaneous TID WC Lang Snow, NP   3 Units at 01/01/20 1142  . insulin aspart (novoLOG) injection 0-5 Units  0-5 Units Subcutaneous QHS Ouma, Bing Neighbors, NP      . mirtazapine (REMERON) tablet 15 mg  15 mg Oral QHS Nolberto Hanlon, MD   15 mg at 12/31/19 2240  . multivitamin-lutein (OCUVITE-LUTEIN) capsule 1 capsule  1 capsule Oral Daily Nolberto Hanlon, MD   1 capsule at 01/01/20 1025  . mupirocin ointment (BACTROBAN) 2 % 1 application  1 application Nasal BID Nolberto Hanlon, MD   1 application at 26/94/85 0900  . pantoprazole (PROTONIX) EC tablet 40 mg  40 mg Oral Daily Nolberto Hanlon, MD   40 mg at 01/01/20 0858  . predniSONE (DELTASONE) tablet 40 mg  40 mg Oral Q breakfast Nolberto Hanlon, MD   40 mg at 01/01/20 0900  . sertraline (ZOLOFT) tablet 100 mg  100 mg Oral QHS Nolberto Hanlon, MD   100 mg at 12/31/19 2240  . sodium chloride flush (NS) 0.9 % injection 3 mL  3 mL Intravenous Q12H Nolberto Hanlon, MD   3 mL at 01/01/20 0900  . vancomycin (VANCOCIN) IVPB 1000 mg/200 mL premix  1,000 mg Intravenous Q24H Lu Duffel, [facility]   Stopped at 12/31/19 1741     Discharge  Medications: Please see discharge summary for a list of discharge medications.  Relevant Imaging Results:  Relevant Lab Results:   Additional Information [id number]  Victorino Dike, RN

## 2020-01-01 NOTE — Progress Notes (Signed)
PROGRESS NOTE    Gary Thomas  MGN:[id number] DOB: [birth year] DOA: 12/29/2019 PCP: Marguarite Arbour, MD    Brief Narrative:  Gary Thomas is a 86 y.o. male with medical history significant of Gary Thomas a 86 y.o.malewith a past medical history of diabetes, gastric reflux, hypertension, CVA, sleep apnea, chronic PE, COPD, presents to the emergency department for shortness of breath.. Patient has shortness of breath for the past couple of months but last couple of days started having sneezing with cough and feeling congested.  His shortness of breath has worsened.  Denies fever, chills, chest pain.  Feels his shortness of breath is worse when he lays down due to the congestion. Was discharged from the hospital last month when he was admitted for sepsis and community-acquired pneumonia with acute hypoxemic respiratory failure.  He was discharged with Augmentin. ED Course:EMS report bil wheezing, RA sat 85%, improved to 99% on 2L after x1 duoneb. RA sat 89% on arrival.  Was started on azithromycin and ceftriaxone with IV Solu-Medrol. BNP was 104 less than before.  Potassium 5.3, creatinine 1.42 with WBC of 13.2. covid neg. ER ordered CTA chest pending    Consultants:  NONE  Procedures:  CT 1. Persistent consolidation in the MEDIAL RIGHT LOWER lobe, slightly improved. 2. Patchy ground-glass opacity in the superior segment of the LEFT LOWER lobe is slightly less dense but otherwise unchanged. Recommend follow-up chest CT in 6 months. 3. Stable elevation of RIGHT hemidiaphragm. 4. Coronary artery disease. 5. Numerous hepatic cysts. 6. Colonic diverticulosis. 7. Pulmonary artery enlargement consistent with pulmonary arterial hypertension. 8. Aortic Atherosclerosis (ICD10-I70.0).  Antimicrobials:   Cefepime, vanco   Subjective: Feeling better today. Less sob. Ate breakfast. Wife at bedside. Had a discussion about going to snf. No new  complaints.  Objective: Vitals:   01/01/20 1129 01/01/20 1136 01/01/20 1138 01/01/20 1535  BP: (!) 157/73   137/74  Pulse: 93 100 98 92  Resp: 18   19  Temp: (!) 97.5 F (36.4 C)   98.5 F (36.9 C)  TempSrc: Oral   Oral  SpO2: 98% 95% 95% 98%  Weight:      Height:        Intake/Output Summary (Last 24 hours) at 01/01/2020 1541 Last data filed at 01/01/2020 1413 Gross per 24 hour  Intake 983.87 ml  Output 1525 ml  Net -541.13 ml   Filed Weights   12/30/19 0336 12/31/19 0438 01/01/20 0301  Weight: 96.2 kg 98.4 kg 96.5 kg    Examination:  General exam: Appears calm and comfortable, sitting in bed, nad Respiratory system: Clear to auscultation. Respiratory effort normal.no wheezing Cardiovascular system: S1 & S2 heard, RRR. No JVD, murmurs, rubs, gallops or clicks.  Gastrointestinal system: Abdomen is nondistended, soft and nontender. Normal bowel sounds heard. Central nervous system: Alert and oriented x3 .  grossly Extremities: no edema Skin: warm, dry Psychiatry: Judgement and insight appear normal. Mood & affect appropriate in setting.     Data Reviewed: I have personally reviewed following labs and imaging studies  CBC: Recent Labs  Lab 12/29/19 1309 12/29/19 2113  WBC 13.2* 14.3*  HGB 15.1 14.1  HCT 47.4 44.4  MCV 93.3 93.7  PLT 151 145*   Basic Metabolic Panel: Recent Labs  Lab 12/29/19 1309 12/29/19 1309 12/30/19 0547 12/30/19 1034 12/30/19 1425 12/31/19 0548 01/01/20 1103  NA 142  --  140  --   --  137 142  K 5.3*   < >  5.7* 6.3* 5.2* 5.0 4.3  CL 108  --  111  --   --  107 108  CO2 26  --  22  --   --  25 26  GLUCOSE 117*  --  136*  --   --  117* 186*  BUN 45*  --  45*  --   --  50* 44*  CREATININE 1.42*  --  1.58*  --   --  1.56* 1.36*  CALCIUM 9.4  --  9.4  --   --  9.1 9.2   < > = values in this interval not displayed.   GFR: Estimated Creatinine Clearance: 45.5 mL/min (A) (by C-G formula based on SCr of 1.36 mg/dL (H)). Liver  Function Tests: Recent Labs  Lab 12/29/19 1309  AST 16  ALT 14  ALKPHOS 78  BILITOT 0.8  PROT 7.0  ALBUMIN 3.8   No results for input(s): LIPASE, AMYLASE in the last 168 hours. No results for input(s): AMMONIA in the last 168 hours. Coagulation Profile: No results for input(s): INR, PROTIME in the last 168 hours. Cardiac Enzymes: No results for input(s): CKTOTAL, CKMB, CKMBINDEX, TROPONINI in the last 168 hours. BNP (last 3 results) No results for input(s): PROBNP in the last 8760 hours. HbA1C: Recent Labs    12/29/19 2113  HGBA1C 5.9*   CBG: Recent Labs  Lab 12/31/19 1205 12/31/19 1548 12/31/19 2020 01/01/20 0810 01/01/20 1129  GLUCAP 84 172* 165* 125* 165*   Lipid Profile: No results for input(s): CHOL, HDL, LDLCALC, TRIG, CHOLHDL, LDLDIRECT in the last 72 hours. Thyroid Function Tests: No results for input(s): TSH, T4TOTAL, FREET4, T3FREE, THYROIDAB in the last 72 hours. Anemia Panel: No results for input(s): VITAMINB12, FOLATE, FERRITIN, TIBC, IRON, RETICCTPCT in the last 72 hours. Sepsis Labs: No results for input(s): PROCALCITON, LATICACIDVEN in the last 168 hours.  Recent Results (from the past 240 hour(s))  MRSA PCR Screening     Status: Abnormal   Collection Time: 12/29/19  3:34 PM   Specimen: Nasal Mucosa; Nasopharyngeal  Result Value Ref Range Status   MRSA by PCR POSITIVE (A) NEGATIVE Final    Comment:        The GeneXpert MRSA Assay (FDA approved for NASAL specimens only), is one component of a comprehensive MRSA colonization surveillance program. It is not intended to diagnose MRSA infection nor to guide or monitor treatment for MRSA infections. RESULT CALLED TO, READ BACK BY AND VERIFIED WITH: Adriana Mccallum RN AT 0454 ON 12/30/19 SNG Performed at [facility], [place], [place] [address]   SARS CORONAVIRUS 2 (TAT 6-24 HRS) Nasopharyngeal Nasopharyngeal Swab     Status: None   Collection Time: 12/29/19  3:53 PM    Specimen: Nasopharyngeal Swab  Result Value Ref Range Status   SARS Coronavirus 2 NEGATIVE NEGATIVE Final    Comment: (NOTE) SARS-CoV-2 target nucleic acids are NOT DETECTED. The SARS-CoV-2 RNA is generally detectable in upper and lower respiratory specimens during the acute phase of infection. Negative results do not preclude SARS-CoV-2 infection, do not rule out co-infections with other pathogens, and should not be used as the sole basis for treatment or other patient management decisions. Negative results must be combined with clinical observations, patient history, and epidemiological information. The expected result is Negative. Fact Sheet for Patients: SugarRoll.be Fact Sheet for Healthcare Providers: https://www.woods-mathews.com/ This test is not yet approved or cleared by the [place] FDA and  has been authorized for detection and/or diagnosis  of SARS-CoV-2 by FDA under an Emergency Use Authorization (EUA). This EUA will remain  in effect (meaning this test can be used) for the duration of the COVID-19 declaration under Section 56 4(b)(1) of the Act, 21 U.S.C. section 360bbb-3(b)(1), unless the authorization is terminated or revoked sooner. Performed at [facility], [address], [place], [place] [address]          Radiology Studies: No results found.      Scheduled Meds: . amLODipine  10 mg Oral Daily  . atorvastatin  20 mg Oral QHS  . Chlorhexidine Gluconate Cloth  6 each Topical Q0600  . cholecalciferol  4,000 Units Oral Daily  . enoxaparin (LOVENOX) injection  40 mg Subcutaneous Q24H  . fluticasone furoate-vilanterol  1 puff Inhalation Daily  . insulin aspart  0-15 Units Subcutaneous TID WC  . insulin aspart  0-5 Units Subcutaneous QHS  . mirtazapine  15 mg Oral QHS  . multivitamin-lutein  1 capsule Oral Daily  . mupirocin ointment  1 application Nasal BID  . pantoprazole  40 mg Oral Daily  .  predniSONE  40 mg Oral Q breakfast  . sertraline  100 mg Oral QHS  . sodium chloride flush  3 mL Intravenous Q12H   Continuous Infusions: . sodium chloride Stopped (01/01/20 1030)  . ceFEPime (MAXIPIME) IV Stopped (01/01/20 0954)  . vancomycin Stopped (12/31/19 1741)    Assessment & Plan:   Active Problems:   Pneumonia   HCAP (healthcare-associated pneumonia)   1.HCAP- admitted with PNA last month. continue iv vanco and cefepime CT done , results above  2.Acute hypoxemic respiratory failure- hypoxic in 80's on admission. cxr as above Treat pna Incentive spirometr. Given lasix 40mg  iv x1 Doing better now. Off 02 in the 90's  2. IDDM- hold home meds RISS Ck fs Hypoglycemic protocal mech soft diet. Thin liquids. General asp. Precaution.  3.acute copd exacerbation- Iv steroids..>started po taper duonebs  4.CKD stage IIb- at baseline Monitor  5.Hyperkalemia-  Place on low k diet Lasix 40mg  iv x1 and kayexal 30gm given yesterday Improved now, K 4.3 Will continue to monitor   6.hypertension- mildly elevated Will increase amlodipine to 10mg  daily    DVT prophylaxis: lovenox Code Status: full Family Communication: wife at bedside , updated Disposition Plan:SNF pending, case mx working on it. covid test sent Barrier: snf pending.      LOS: 2 days   Time spent: 45 min with >50 % coc    , MD [facility] Pager [phone]  If 7PM-7AM, please contact night-coverage [email or web address] Password [facility] 01/01/2020, 3:41 PM Patient ID: Gary Thomas, male   DOB: May 18, 1932, 86 y.o.   MRN: [id number]

## 2020-01-02 DIAGNOSIS — E875 Hyperkalemia: Secondary | ICD-10-CM

## 2020-01-02 LAB — GLUCOSE, CAPILLARY
Glucose-Capillary: 102 mg/dL — ABNORMAL HIGH (ref 70–99)
Glucose-Capillary: 124 mg/dL — ABNORMAL HIGH (ref 70–99)
Glucose-Capillary: 162 mg/dL — ABNORMAL HIGH (ref 70–99)
Glucose-Capillary: 180 mg/dL — ABNORMAL HIGH (ref 70–99)

## 2020-01-02 LAB — URINE CULTURE: Culture: NO GROWTH

## 2020-01-02 LAB — SARS CORONAVIRUS 2 (TAT 6-24 HRS): SARS Coronavirus 2: NEGATIVE

## 2020-01-02 MED ORDER — AMLODIPINE BESYLATE 10 MG PO TABS: 10.0000 mg | ORAL_TABLET | Freq: Every day | ORAL | Status: DC

## 2020-01-02 MED ORDER — AMOXICILLIN-POT CLAVULANATE 875-125 MG PO TABS
1.0000 | ORAL_TABLET | Freq: Two times a day (BID) | ORAL | Status: DC
  Administered 2020-01-02 (×2): 1 via ORAL
  Filled 2020-01-02 (×2): qty 1

## 2020-01-02 MED ORDER — METOPROLOL SUCCINATE ER 25 MG PO TB24: 100.0000 mg | ORAL_TABLET | Freq: Every day | ORAL | 0 refills | Status: DC

## 2020-01-02 MED ORDER — PREDNISONE 20 MG PO TABS: 20.0000 mg | ORAL_TABLET | Freq: Every day | ORAL | 0 refills | Status: AC

## 2020-01-02 MED ORDER — OMEPRAZOLE 40 MG PO CPDR: 40.0000 mg | DELAYED_RELEASE_CAPSULE | Freq: Every day | ORAL | 0 refills | Status: AC

## 2020-01-02 MED ORDER — DOXYCYCLINE MONOHYDRATE 100 MG PO TABS: 100.0000 mg | ORAL_TABLET | Freq: Two times a day (BID) | ORAL | 0 refills | Status: AC

## 2020-01-02 MED ORDER — AMOXICILLIN-POT CLAVULANATE 875-125 MG PO TABS: 1.0000 | ORAL_TABLET | Freq: Two times a day (BID) | ORAL | Status: AC

## 2020-01-02 NOTE — Progress Notes (Signed)
EMS arrives at this time. All HS medications administered prior to discharge. Patient alert and oriented at this time, denies pain or discomfort. All belongings sent with patient.

## 2020-01-02 NOTE — Discharge Summary (Signed)
Gary Thomas:[id number] DOB: 1932/08/19 DOA: 12/29/2019  PCP: Marguarite Arbour, MD  Admit date: 12/29/2019 Discharge date: 01/02/2020  Admitted From: home Disposition:  [facility]  Recommendations for Outpatient Follow-up:  1. Follow up with PCP in 1 week 2. Please obtain BMP/CBC in one week    Discharge Condition:Stable CODE STATUS:full Diet recommendation: carb control - Mech Soft diet (easy to masticate); Thin liquids. General aspiration precautions. REFLUX precautions secondary to GERD. Tray setup at meals -- reduce distractions including Talking at meals  Brief/Interim Summary: Gary Thomas is a 86 y.o. male with medical history significant of Nhat R Battenis a 86 y.o.malewith a past medical history of diabetes, gastric reflux, hypertension, CVA, sleep apnea, chronic PE, COPD, presents to the emergency department for shortness of breath.. Patient has shortness of breath for the past couple of months but last couple of days started having sneezing with cough and feeling congested.  His shortness of breath has worsened.EMS reported bilateral wheezing, RA sat 85%, improved to 99% on 2L after x1 duoneb. RA sat 89% on arrival.  Was started on azithromycin and ceftriaxone with IV Solu-Medrol.  Patient was started on IV antibiotics and IV steroid.  He was diagnosed with pneumonia.  CTA of chest scan was done with results below.  He was admitted for acute hypoxemic respiratory failure as well as he was hypoxic on admission in the 10s.  He was also given Lasix IV x1.  His potassium levels were elevated and was treated with insulin and Lasix and Kayexalate.  His potassium levels did stabilize.  His steroids were tapered.  His blood pressure medications were adjusted.  He is stable to be discharged to [facility] based on PT OT's recommendation for SNF. He is off oxygen now. He needs to take prednisone and antibiotic starting on 3/24 and discontinue after 2 days to complete  course.   Discharge Diagnoses:  Active Problems:   Pneumonia   HCAP (healthcare-associated pneumonia)   COPD exacerbation (HCC)   Hypokalemia    Discharge Instructions  Discharge Instructions    Call MD for:  difficulty breathing, headache or visual disturbances   Complete by: As directed    Call MD for:  temperature >100.4   Complete by: As directed    Diet - low sodium heart healthy   Complete by: As directed    Discharge instructions   Complete by: As directed    F/u with pcp in one week   Increase activity slowly   Complete by: As directed      Allergies as of 01/02/2020      Reactions   Codeine Anaphylaxis   Morphine Anaphylaxis   Gabapentin Other (See Comments)   Unsure of what the reaction was   Stadol [butorphanol]    Unknown   Lyrica [pregabalin] Rash   Tapentadol Nausea Only   ONSET 01/04/2013   Tramadol    Hallucinations "I see giant spiders on ceiling". Pt also believes he is in [facility], but he is in [place].      Medication List    STOP taking these medications   losartan 25 MG tablet Commonly known as: COZAAR     TAKE these medications   acetaminophen 325 MG tablet Commonly known as: TYLENOL Take 2 tablets (650 mg total) by mouth every 6 (six) hours as needed for mild pain (or Fever >/= 101).   albuterol (2.5 MG/3ML) 0.083% nebulizer solution Commonly known as: PROVENTIL Take 3 mLs (2.5 mg total) by  nebulization every 4 (four) hours as needed for wheezing or shortness of breath.   amLODipine 10 MG tablet Commonly known as: NORVASC Take 1 tablet (10 mg total) by mouth daily. Start taking on: January 03, 2020 What changed:   medication strength  how much to take   amoxicillin-clavulanate 875-125 MG tablet Commonly known as: AUGMENTIN Take 1 tablet by mouth every 12 (twelve) hours for 2 days. Start taking on: January 03, 2020   atorvastatin 20 MG tablet Commonly known as: LIPITOR Take 1 tablet (20 mg total) by mouth at bedtime.    Cholecalciferol 100 MCG (4000 UT) Caps Take 1 capsule by mouth daily.   doxycycline 100 MG tablet Commonly known as: ADOXA Take 1 tablet (100 mg total) by mouth 2 (two) times daily for 2 days. Start taking on: January 03, 2020   megestrol 40 MG/ML suspension Commonly known as: MEGACE Take 400 mg by mouth daily.   metoprolol succinate 25 MG 24 hr tablet Commonly known as: TOPROL-XL Take 4 tablets (100 mg total) by mouth at bedtime. What changed: medication strength   mirtazapine 15 MG tablet Commonly known as: REMERON Take 15 mg by mouth at bedtime.   multivitamin-lutein Caps capsule Take 1 capsule by mouth daily.   nystatin powder Commonly known as: MYCOSTATIN/NYSTOP Apply topically as directed. Apply with each ostomy change as instructed   omeprazole 40 MG capsule Commonly known as: PRILOSEC Take 1 capsule (40 mg total) by mouth daily.   predniSONE 20 MG tablet Commonly known as: DELTASONE Take 1 tablet (20 mg total) by mouth daily with breakfast for 1 day. Start taking on: January 03, 2020   sertraline 100 MG tablet Commonly known as: ZOLOFT Take 1 tablet (100 mg total) by mouth at bedtime.   tiotropium 18 MCG inhalation capsule Commonly known as: SPIRIVA Place 18 mcg into inhaler and inhale daily.   Vitamin D (Ergocalciferol) 1.25 MG (50000 UNIT) Caps capsule Commonly known as: DRISDOL Take 50,000 Units by mouth once a week.      Follow-up Information    Marguarite ArbourSparks, [name], MD Follow up in 1 week(s).   Specialty: Internal Medicine Contact information: [address] [facility] [place] [id number] [phone]          Allergies  Allergen Reactions  . Codeine Anaphylaxis  . Morphine Anaphylaxis  . Gabapentin Other (See Comments)    Unsure of what the reaction was   . Stadol [Butorphanol]     Unknown  . Lyrica [Pregabalin] Rash  . Tapentadol Nausea Only    ONSET 01/04/2013  . Tramadol     Hallucinations "I see giant spiders on  ceiling". [place] [place] [facility], but [place].    Consultations:  none   Procedures/Studies: DG Chest 2 View  Result Date: 12/29/2019 CLINICAL DATA:  Shortness of breath beginning today, history COPD, pulmonary embolism, hypertension, GERD, stroke EXAM: CHEST - 2 VIEW COMPARISON:  Portable exam of 11/17/2019 FINDINGS: Normal heart size, mediastinal contours, and pulmonary vascularity. Atherosclerotic calcification aorta. Chronic elevation RIGHT diaphragm with RIGHT basilar atelectasis. Chronic bronchitic changes. No infiltrate, pleural effusion or pneumothorax. Osseous structures unremarkable. IMPRESSION: Mild chronic bronchitic changes and RIGHT basilar atelectasis. No acute abnormalities. Electronically Signed   By: Ulyses SouthwardMark  Boles M.D.   On: 12/29/2019 13:48   CT Chest Wo Contrast  Result Date: 12/29/2019 CLINICAL DATA:  Hypoxemia. Shortness of breath started today. Bilateral wheezing. Low oxygen sats. History of COPD. EXAM: CT  CHEST WITHOUT CONTRAST TECHNIQUE: Multidetector CT imaging of the chest was performed following the standard protocol without IV contrast. COMPARISON:  CT angio chest on 11/17/2019, chest x-ray today FINDINGS: Cardiovascular: Heart size is normal. No pericardial effusion. There is dense atherosclerotic calcification of the coronary arteries. There is atherosclerotic calcification of the thoracic aorta not associated with aneurysm. Stable dilatation of the pulmonary artery. Mediastinum/Nodes: The esophagus is unremarkable. No significant mediastinal, hilar, or axillary adenopathy. LEFT thyroid nodule is 1.3 centimeters and associated with punctate calcifications. Subcentimeter partially calcified RIGHT thyroid nodule is also present, both stable in appearance. No followup recommended (ref: J Am Coll Radiol. 2015 Feb;12(2): 143-50). Lungs/Pleura: Persistent consolidation in the MEDIAL RIGHT LOWER lobe has improved slightly. Patchy ground-glass opacities in the  superior segment of the LEFT LOWER lobe is slightly less dense but persists. No pleural effusions. Airways are patent. Upper Abdomen: Numerous hepatic cysts. Stable elevation of RIGHT hemidiaphragm. Colonic diverticulosis. Musculoskeletal: Degenerative changes are present in the thoracic spine. No suspicious lytic or blastic lesions are identified. IMPRESSION: 1. Persistent consolidation in the MEDIAL RIGHT LOWER lobe, slightly improved. 2. Patchy ground-glass opacity in the superior segment of the LEFT LOWER lobe is slightly less dense but otherwise unchanged. Recommend follow-up chest CT in 6 months. 3. Stable elevation of RIGHT hemidiaphragm. 4. Coronary artery disease. 5. Numerous hepatic cysts. 6. Colonic diverticulosis. 7. Pulmonary artery enlargement consistent with pulmonary arterial hypertension. 8. Aortic Atherosclerosis (ICD10-I70.0). Electronically Signed   By: Norva Pavlov M.D.   On: 12/29/2019 15:22       Subjective: Pt feels well.  Wife at bedside.  No complaints.  Discharge Exam: Vitals:   01/02/20 0952 01/02/20 1136  BP: 138/64 (!) 169/77  Pulse: 91 91  Resp:  17  Temp: 98.2 F (36.8 C) 97.7 F (36.5 C)  SpO2: 94% 99%   Vitals:   01/02/20 0614 01/02/20 0738 01/02/20 0952 01/02/20 1136  BP: (!) 166/109 (!) 187/82 138/64 (!) 169/77  Pulse: 97 82 91 91  Resp: 14 17  17   Temp: 97.7 F (36.5 C) 97.9 F (36.6 C) 98.2 F (36.8 C) 97.7 F (36.5 C)  TempSrc: Oral Oral Oral Oral  SpO2: 91% 93% 94% 99%  Weight:      Height:        General: Pt is alert, awake, not in acute distress Cardiovascular: RRR, S1/S2 +, no rubs, no gallops Respiratory: CTA bilaterally, no wheezing, no rhonchi Abdominal: Soft, NT, ND, bowel sounds + Extremities: no edema, no cyanosis    The results of significant diagnostics from this hospitalization (including imaging, microbiology, ancillary and laboratory) are listed below for reference.     Microbiology: Recent Results (from the  past 240 hour(s))  MRSA PCR Screening     Status: Abnormal   Collection Time: 12/29/19  3:34 PM   Specimen: Nasal Mucosa; Nasopharyngeal  Result Value Ref Range Status   MRSA by PCR POSITIVE (A) NEGATIVE Final    Comment:        The GeneXpert MRSA Assay (FDA approved for NASAL specimens only), is one component of a comprehensive MRSA colonization surveillance program. It is not intended to diagnose MRSA infection nor to guide or monitor treatment for MRSA infections. RESULT CALLED TO, READ BACK BY AND VERIFIED WITH: 12/31/19 RN AT 1422 ON 12/30/19 SNG Performed at [facility], [address] [place], [place]   SARS CORONAVIRUS 2 (TAT 6-24 HRS) Nasopharyngeal Nasopharyngeal Swab     Status: None  Collection Time: 12/29/19  3:53 PM   Specimen: Nasopharyngeal Swab  Result Value Ref Range Status   SARS Coronavirus 2 NEGATIVE NEGATIVE Final    Comment: (NOTE) SARS-CoV-2 target nucleic acids are NOT DETECTED. The SARS-CoV-2 RNA is generally detectable in upper and lower respiratory specimens during the acute phase of infection. Negative results do not preclude SARS-CoV-2 infection, do not rule out co-infections with other pathogens, and should not be used as the sole basis for treatment or other patient management decisions. Negative results must be combined with clinical observations, patient history, and epidemiological information. The expected result is Negative. Fact Sheet for Patients: HairSlick.no Fact Sheet for Healthcare Providers: quierodirigir.com This test is not yet approved or cleared by the [place] FDA and  has been authorized for detection and/or diagnosis of SARS-CoV-2 by FDA under an Emergency Use Authorization (EUA). This EUA will remain  in effect (meaning this test can be used) for the duration of the COVID-19 declaration under Section 56 4(b)(1) of the Act, 21  U.S.C. section 360bbb-3(b)(1), unless the authorization is terminated or revoked sooner. Performed at [facility], [address], [place], [place] [address]   Urine culture     Status: None   Collection Time: 01/01/20  6:03 AM   Specimen: Urine, Random  Result Value Ref Range Status   Specimen Description   Final    URINE, RANDOM Performed at [facility], [address] [place], [place] [address]    Special Requests   Final    NONE Performed at [facility], [address] [place], [place] [address]    Culture   Final    NO GROWTH Performed at [facility], [address], [place], [place] [address]    Report Status 01/02/2020 FINAL  Final  SARS CORONAVIRUS 2 (TAT 6-24 HRS) Nasopharyngeal Nasopharyngeal Swab     Status: None   Collection Time: 01/01/20  4:21 PM   Specimen: Nasopharyngeal Swab  Result Value Ref Range Status   SARS Coronavirus 2 NEGATIVE NEGATIVE Final    Comment: (NOTE) SARS-CoV-2 target nucleic acids are NOT DETECTED. The SARS-CoV-2 RNA is generally detectable in upper and lower respiratory specimens during the acute phase of infection. Negative results do not preclude SARS-CoV-2 infection, do not rule out co-infections with other pathogens, and should not be used as the sole basis for treatment or other patient management decisions. Negative results must be combined with clinical observations, patient history, and epidemiological information. The expected result is Negative. Fact Sheet for Patients: HairSlick.no Fact Sheet for Healthcare Providers: quierodirigir.com This test is not yet approved or cleared by the [place] FDA and  has been authorized for detection and/or diagnosis of SARS-CoV-2 by FDA under an Emergency Use Authorization (EUA). This EUA will remain  in effect (meaning this test can be used) for the duration of the COVID-19 declaration under  Section 56 4(b)(1) of the Act, 21 U.S.C. section 360bbb-3(b)(1), unless the authorization is terminated or revoked sooner. Performed at [facility], [address], [place], [place] [address]      Labs: BNP (last 3 results) Recent Labs    12/29/19 1303  BNP 104.0*   Basic Metabolic Panel: Recent Labs  Lab 12/29/19 1309 12/29/19 1309 12/30/19 0547 12/30/19 1034 12/30/19 1425 12/31/19 0548 01/01/20 1103  NA 142  --  140  --   --  137 142  K 5.3*   < > 5.7* 6.3* 5.2* 5.0 4.3  CL 108  --  111  --   --  107 108  CO2 26  --  22  --   --  25 26  GLUCOSE 117*  --  136*  --   --  117* 186*  BUN 45*  --  45*  --   --  50* 44*  CREATININE 1.42*  --  1.58*  --   --  1.56* 1.36*  CALCIUM 9.4  --  9.4  --   --  9.1 9.2   < > = values in this interval not displayed.   Liver Function Tests: Recent Labs  Lab 12/29/19 1309  AST 16  ALT 14  ALKPHOS 78  BILITOT 0.8  PROT 7.0  ALBUMIN 3.8   No results for input(s): LIPASE, AMYLASE in the last 168 hours. No results for input(s): AMMONIA in the last 168 hours. CBC: Recent Labs  Lab 12/29/19 1309 12/29/19 2113  WBC 13.2* 14.3*  HGB 15.1 14.1  HCT 47.4 44.4  MCV 93.3 93.7  PLT 151 145*   Cardiac Enzymes: No results for input(s): CKTOTAL, CKMB, CKMBINDEX, TROPONINI in the last 168 hours. BNP: Invalid input(s): POCBNP CBG: Recent Labs  Lab 01/01/20 0810 01/01/20 1129 01/01/20 1559 01/01/20 2144 01/02/20 0729  GLUCAP 125* 165* 199* 184* 102*   D-Dimer No results for input(s): DDIMER in the last 72 hours. Hgb A1c No results for input(s): HGBA1C in the last 72 hours. Lipid Profile No results for input(s): CHOL, HDL, LDLCALC, TRIG, CHOLHDL, LDLDIRECT in the last 72 hours. Thyroid function studies No results for input(s): TSH, T4TOTAL, T3FREE, THYROIDAB in the last 72 hours.  Invalid input(s): FREET3 Anemia work up No results for input(s): VITAMINB12, FOLATE, FERRITIN, TIBC, IRON, RETICCTPCT in the last 72  hours. Urinalysis    Component Value Date/Time   COLORURINE YELLOW (A) 01/01/2020 0603   APPEARANCEUR CLEAR (A) 01/01/2020 0603   LABSPEC 1.018 01/01/2020 0603   PHURINE 6.0 01/01/2020 0603   GLUCOSEU NEGATIVE 01/01/2020 0603   HGBUR NEGATIVE 01/01/2020 0603   BILIRUBINUR NEGATIVE 01/01/2020 0603   KETONESUR NEGATIVE 01/01/2020 0603   PROTEINUR 30 (A) 01/01/2020 0603   NITRITE NEGATIVE 01/01/2020 0603   LEUKOCYTESUR NEGATIVE 01/01/2020 0603   Sepsis Labs Invalid input(s): PROCALCITONIN,  WBC,  LACTICIDVEN Microbiology Recent Results (from the past 240 hour(s))  MRSA PCR Screening     Status: Abnormal   Collection Time: 12/29/19  3:34 PM   Specimen: Nasal Mucosa; Nasopharyngeal  Result Value Ref Range Status   MRSA by PCR POSITIVE (A) NEGATIVE Final    Comment:        The GeneXpert MRSA Assay (FDA approved for NASAL specimens only), is one component of a comprehensive MRSA colonization surveillance program. It is not intended to diagnose MRSA infection nor to guide or monitor treatment for MRSA infections. RESULT CALLED TO, READ BACK BY AND VERIFIED WITH: Adriana Mccallum RN AT 6283 ON 12/30/19 SNG Performed at [facility], [place], [place] [address]   SARS CORONAVIRUS 2 (TAT 6-24 HRS) Nasopharyngeal Nasopharyngeal Swab     Status: None   Collection Time: 12/29/19  3:53 PM   Specimen: Nasopharyngeal Swab  Result Value Ref Range Status   SARS Coronavirus 2 NEGATIVE NEGATIVE Final    Comment: (NOTE) SARS-CoV-2 target nucleic acids are NOT DETECTED. The SARS-CoV-2 RNA is generally detectable in upper and lower respiratory specimens during the acute phase of infection. Negative results do not preclude SARS-CoV-2 infection, do not rule  out co-infections with other pathogens, and should not be used as the sole basis for treatment or other patient management decisions. Negative results must be combined with clinical observations, patient history,  and epidemiological information. The expected result is Negative. Fact Sheet for Patients: HairSlick.no Fact Sheet for Healthcare Providers: quierodirigir.com This test is not yet approved or cleared by the [place] FDA and  has been authorized for detection and/or diagnosis of SARS-CoV-2 by FDA under an Emergency Use Authorization (EUA). This EUA will remain  in effect (meaning this test can be used) for the duration of the COVID-19 declaration under Section 56 4(b)(1) of the Act, 21 U.S.C. section 360bbb-3(b)(1), unless the authorization is terminated or revoked sooner. Performed at [facility], [address] [place], [place] [address]   Urine culture     Status: None   Collection Time: 01/01/20  6:03 AM   Specimen: Urine, Random  Result Value Ref Range Status   Specimen Description   Final    URINE, RANDOM Performed at [facility], [address], [place], [place] [address]    Special Requests   Final    NONE Performed at [facility], [address], [place], [place] [address]    Culture   Final    NO GROWTH Performed at [facility], [address], [place], [place] [address]    Report Status 01/02/2020 FINAL  Final  SARS CORONAVIRUS 2 (TAT 6-24 HRS) Nasopharyngeal Nasopharyngeal Swab     Status: None   Collection Time: 01/01/20  4:21 PM   Specimen: Nasopharyngeal Swab  Result Value Ref Range Status   SARS Coronavirus 2 NEGATIVE NEGATIVE Final    Comment: (NOTE) SARS-CoV-2 target nucleic acids are NOT DETECTED. The SARS-CoV-2 RNA is generally detectable in upper and lower respiratory specimens during the acute phase of infection. Negative results do not preclude SARS-CoV-2 infection, do not rule out co-infections with other pathogens, and should not be used as the sole basis for treatment or other patient management decisions. Negative results must be combined with clinical  observations, patient history, and epidemiological information. The expected result is Negative. Fact Sheet for Patients: HairSlick.no Fact Sheet for Healthcare Providers: quierodirigir.com This test is not yet approved or cleared by the [place] FDA and  has been authorized for detection and/or diagnosis of SARS-CoV-2 by FDA under an Emergency Use Authorization (EUA). This EUA will remain  in effect (meaning this test can be used) for the duration of the COVID-19 declaration under Section 56 4(b)(1) of the Act, 21 U.S.C. section 360bbb-3(b)(1), unless the authorization is terminated or revoked sooner. Performed at [facility], [address], [place], [place] [address]      Time coordinating discharge: Over 30 minutes  SIGNED:   Lynn Ito, MD  [facility] 01/02/2020, 11:50 AM Pager   If 7PM-7AM, please contact night-coverage [email or web address] Password [email or web address]

## 2020-01-02 NOTE — Progress Notes (Signed)
Report given to [facility]. Patient going to room 11B. Patient and wife aware.

## 2020-01-02 NOTE — Plan of Care (Signed)
  Problem: Education: Goal: Knowledge of General Education information will improve Description Including pain rating scale, medication(s)/side effects and non-pharmacologic comfort measures Outcome: Progressing   Problem: Health Behavior/Discharge Planning: Goal: Ability to manage health-related needs will improve Outcome: Progressing   Problem: Clinical Measurements: Goal: Ability to maintain clinical measurements within normal limits will improve Outcome: Progressing Goal: Will remain free from infection Outcome: Progressing Goal: Diagnostic test results will improve Outcome: Progressing Goal: Respiratory complications will improve Outcome: Progressing Goal: Cardiovascular complication will be avoided Outcome: Progressing   Problem: Activity: Goal: Risk for activity intolerance will decrease Outcome: Progressing   Problem: Coping: Goal: Level of anxiety will decrease Outcome: Progressing   Problem: Elimination: Goal: Will not experience complications related to bowel motility Outcome: Progressing Goal: Will not experience complications related to urinary retention Outcome: Progressing   Problem: Pain Managment: Goal: General experience of comfort will improve Outcome: Progressing   Problem: Safety: Goal: Ability to remain free from injury will improve Outcome: Progressing   Problem: Skin Integrity: Goal: Risk for impaired skin integrity will decrease Outcome: Progressing   Problem: Activity: Goal: Ability to tolerate increased activity will improve Outcome: Progressing   Problem: Clinical Measurements: Goal: Ability to maintain a body temperature in the normal range will improve Outcome: Progressing   Problem: Respiratory: Goal: Ability to maintain adequate ventilation will improve Outcome: Progressing Goal: Ability to maintain a clear airway will improve Outcome: Progressing   

## 2020-01-02 NOTE — Plan of Care (Signed)
Problem: Education: Goal: Knowledge of General Education information will improve Description: Including pain rating scale, medication(s)/side effects and non-pharmacologic comfort measures 01/02/2020 1215 by Alesia Banda, RN Outcome: Adequate for Discharge 01/02/2020 1214 by Alesia Banda, RN Outcome: Adequate for Discharge 01/02/2020 0818 by Alesia Banda, RN Outcome: Progressing   Problem: Health Behavior/Discharge Planning: Goal: Ability to manage health-related needs will improve 01/02/2020 1215 by Alesia Banda, RN Outcome: Adequate for Discharge 01/02/2020 1214 by Alesia Banda, RN Outcome: Adequate for Discharge 01/02/2020 0818 by Alesia Banda, RN Outcome: Progressing   Problem: Clinical Measurements: Goal: Ability to maintain clinical measurements within normal limits will improve 01/02/2020 1215 by Alesia Banda, RN Outcome: Adequate for Discharge 01/02/2020 1214 by Alesia Banda, RN Outcome: Adequate for Discharge 01/02/2020 0818 by Alesia Banda, RN Outcome: Progressing Goal: Will remain free from infection 01/02/2020 1215 by Alesia Banda, RN Outcome: Adequate for Discharge 01/02/2020 1214 by Alesia Banda, RN Outcome: Adequate for Discharge 01/02/2020 0818 by Alesia Banda, RN Outcome: Progressing Goal: Diagnostic test results will improve 01/02/2020 1215 by Alesia Banda, RN Outcome: Adequate for Discharge 01/02/2020 1214 by Alesia Banda, RN Outcome: Adequate for Discharge 01/02/2020 0818 by Alesia Banda, RN Outcome: Progressing Goal: Respiratory complications will improve 01/02/2020 1215 by Alesia Banda, RN Outcome: Adequate for Discharge 01/02/2020 1214 by Alesia Banda, RN Outcome: Adequate for Discharge 01/02/2020 0818 by Alesia Banda, RN Outcome: Progressing Goal: Cardiovascular complication will be avoided 01/02/2020 1215 by Alesia Banda, RN Outcome: Adequate for Discharge 01/02/2020 1214 by Alesia Banda, RN Outcome: Adequate for  Discharge 01/02/2020 0818 by Alesia Banda, RN Outcome: Progressing   Problem: Activity: Goal: Risk for activity intolerance will decrease 01/02/2020 1215 by Alesia Banda, RN Outcome: Adequate for Discharge 01/02/2020 1214 by Alesia Banda, RN Outcome: Adequate for Discharge 01/02/2020 0818 by Alesia Banda, RN Outcome: Progressing   Problem: Coping: Goal: Level of anxiety will decrease 01/02/2020 1215 by Alesia Banda, RN Outcome: Adequate for Discharge 01/02/2020 1214 by Alesia Banda, RN Outcome: Adequate for Discharge 01/02/2020 0818 by Alesia Banda, RN Outcome: Progressing   Problem: Elimination: Goal: Will not experience complications related to bowel motility 01/02/2020 1215 by Alesia Banda, RN Outcome: Adequate for Discharge 01/02/2020 1214 by Alesia Banda, RN Outcome: Adequate for Discharge 01/02/2020 0818 by Alesia Banda, RN Outcome: Progressing Goal: Will not experience complications related to urinary retention 01/02/2020 1215 by Alesia Banda, RN Outcome: Adequate for Discharge 01/02/2020 1214 by Alesia Banda, RN Outcome: Adequate for Discharge 01/02/2020 0818 by Alesia Banda, RN Outcome: Progressing   Problem: Pain Managment: Goal: General experience of comfort will improve 01/02/2020 1215 by Alesia Banda, RN Outcome: Adequate for Discharge 01/02/2020 1214 by Alesia Banda, RN Outcome: Adequate for Discharge 01/02/2020 0818 by Alesia Banda, RN Outcome: Progressing   Problem: Safety: Goal: Ability to remain free from injury will improve 01/02/2020 1215 by Alesia Banda, RN Outcome: Adequate for Discharge 01/02/2020 1214 by Alesia Banda, RN Outcome: Adequate for Discharge 01/02/2020 0818 by Alesia Banda, RN Outcome: Progressing   Problem: Skin Integrity: Goal: Risk for impaired skin integrity will decrease 01/02/2020 1215 by Alesia Banda, RN Outcome: Adequate for Discharge 01/02/2020 1214 by Alesia Banda, RN Outcome: Adequate for  Discharge 01/02/2020 0818 by Alesia Banda, RN Outcome: Progressing   Problem: Activity: Goal: Ability to tolerate increased  activity will improve 01/02/2020 1215 by Claiborne Billings, RN Outcome: Adequate for Discharge 01/02/2020 1214 by Claiborne Billings, RN Outcome: Adequate for Discharge 01/02/2020 0818 by Claiborne Billings, RN Outcome: Progressing   Problem: Clinical Measurements: Goal: Ability to maintain a body temperature in the normal range will improve 01/02/2020 1215 by Claiborne Billings, RN Outcome: Adequate for Discharge 01/02/2020 1214 by Claiborne Billings, RN Outcome: Adequate for Discharge 01/02/2020 0818 by Claiborne Billings, RN Outcome: Progressing   Problem: Respiratory: Goal: Ability to maintain adequate ventilation will improve 01/02/2020 1215 by Claiborne Billings, RN Outcome: Adequate for Discharge 01/02/2020 1214 by Claiborne Billings, RN Outcome: Adequate for Discharge 01/02/2020 0818 by Claiborne Billings, RN Outcome: Progressing Goal: Ability to maintain a clear airway will improve 01/02/2020 1215 by Claiborne Billings, RN Outcome: Adequate for Discharge 01/02/2020 1214 by Claiborne Billings, RN Outcome: Adequate for Discharge 01/02/2020 0818 by Claiborne Billings, RN Outcome: Progressing

## 2020-01-02 NOTE — Plan of Care (Signed)
  Problem: Education: Goal: Knowledge of General Education information will improve Description: Including pain rating scale, medication(s)/side effects and non-pharmacologic comfort measures 01/02/2020 1214 by Alesia Banda, RN Outcome: Adequate for Discharge 01/02/2020 0818 by Alesia Banda, RN Outcome: Progressing   Problem: Health Behavior/Discharge Planning: Goal: Ability to manage health-related needs will improve 01/02/2020 1214 by Alesia Banda, RN Outcome: Adequate for Discharge 01/02/2020 0818 by Alesia Banda, RN Outcome: Progressing   Problem: Clinical Measurements: Goal: Ability to maintain clinical measurements within normal limits will improve 01/02/2020 1214 by Alesia Banda, RN Outcome: Adequate for Discharge 01/02/2020 0818 by Alesia Banda, RN Outcome: Progressing Goal: Will remain free from infection 01/02/2020 1214 by Alesia Banda, RN Outcome: Adequate for Discharge 01/02/2020 0818 by Alesia Banda, RN Outcome: Progressing Goal: Diagnostic test results will improve 01/02/2020 1214 by Alesia Banda, RN Outcome: Adequate for Discharge 01/02/2020 0818 by Alesia Banda, RN Outcome: Progressing Goal: Respiratory complications will improve 01/02/2020 1214 by Alesia Banda, RN Outcome: Adequate for Discharge 01/02/2020 0818 by Alesia Banda, RN Outcome: Progressing Goal: Cardiovascular complication will be avoided 01/02/2020 1214 by Alesia Banda, RN Outcome: Adequate for Discharge 01/02/2020 0818 by Alesia Banda, RN Outcome: Progressing   Problem: Activity: Goal: Risk for activity intolerance will decrease 01/02/2020 1214 by Alesia Banda, RN Outcome: Adequate for Discharge 01/02/2020 0818 by Alesia Banda, RN Outcome: Progressing   Problem: Coping: Goal: Level of anxiety will decrease 01/02/2020 1214 by Alesia Banda, RN Outcome: Adequate for Discharge 01/02/2020 0818 by Alesia Banda, RN Outcome: Progressing   Problem: Elimination: Goal:  Will not experience complications related to bowel motility 01/02/2020 1214 by Alesia Banda, RN Outcome: Adequate for Discharge 01/02/2020 0818 by Alesia Banda, RN Outcome: Progressing Goal: Will not experience complications related to urinary retention 01/02/2020 1214 by Alesia Banda, RN Outcome: Adequate for Discharge 01/02/2020 0818 by Alesia Banda, RN Outcome: Progressing   Problem: Pain Managment: Goal: General experience of comfort will improve 01/02/2020 1214 by Alesia Banda, RN Outcome: Adequate for Discharge 01/02/2020 0818 by Alesia Banda, RN Outcome: Progressing   Problem: Safety: Goal: Ability to remain free from injury will improve 01/02/2020 1214 by Alesia Banda, RN Outcome: Adequate for Discharge 01/02/2020 0818 by Alesia Banda, RN Outcome: Progressing   Problem: Skin Integrity: Goal: Risk for impaired skin integrity will decrease 01/02/2020 1214 by Alesia Banda, RN Outcome: Adequate for Discharge 01/02/2020 0818 by Alesia Banda, RN Outcome: Progressing   Problem: Activity: Goal: Ability to tolerate increased activity will improve 01/02/2020 1214 by Alesia Banda, RN Outcome: Adequate for Discharge 01/02/2020 0818 by Alesia Banda, RN Outcome: Progressing   Problem: Clinical Measurements: Goal: Ability to maintain a body temperature in the normal range will improve 01/02/2020 1214 by Alesia Banda, RN Outcome: Adequate for Discharge 01/02/2020 0818 by Alesia Banda, RN Outcome: Progressing   Problem: Respiratory: Goal: Ability to maintain adequate ventilation will improve 01/02/2020 1214 by Alesia Banda, RN Outcome: Adequate for Discharge 01/02/2020 0818 by Alesia Banda, RN Outcome: Progressing Goal: Ability to maintain a clear airway will improve 01/02/2020 1214 by Alesia Banda, RN Outcome: Adequate for Discharge 01/02/2020 0818 by Alesia Banda, RN Outcome: Progressing

## 2020-01-02 NOTE — Care Management Important Message (Signed)
Important Message  Patient Details  Name: DEMETRIA LIGHTSEY MRN: [id number] Date of Birth: 1931/11/08   Medicare Important Message Given:  Yes     Johnell Comings 01/02/2020, 12:15 PM

## 2020-01-02 NOTE — Discharge Instructions (Signed)
COPD and Physical Activity Chronic obstructive pulmonary disease (COPD) is a long-term (chronic) condition that affects the lungs. COPD is a general term that can be used to describe many different lung problems that cause lung swelling (inflammation) and limit airflow, including chronic bronchitis and emphysema. The main symptom of COPD is shortness of breath, which makes it harder to do even simple tasks. This can also make it harder to exercise and be active. Talk with your health care provider about treatments to help you breathe better and actions you can take to prevent breathing problems during physical activity. What are the benefits of exercising with COPD? Exercising regularly is an important part of a healthy lifestyle. You can still exercise and do physical activities even though you have COPD. Exercise and physical activity improve your shortness of breath by increasing blood flow (circulation). This causes your heart to pump more oxygen through your body. Moderate exercise can improve your:  Oxygen use.  Energy level.  Shortness of breath.  Strength in your breathing muscles.  Heart health.  Sleep.  Self-esteem and feelings of self-worth.  Depression, stress, and anxiety levels. Exercise can benefit everyone with COPD. The severity of your disease may affect how hard you can exercise, especially at first, but everyone can benefit. Talk with your health care provider about how much exercise is safe for you, and which activities and exercises are safe for you. What actions can I take to prevent breathing problems during physical activity?  Sign up for a pulmonary rehabilitation program. This type of program may include: ? Education about lung diseases. ? Exercise classes that teach you how to exercise and be more active while improving your breathing. This usually involves:  Exercise using your lower extremities, such as a stationary bicycle.  About 30 minutes of exercise, 2  to 5 times per week, for 6 to 12 weeks  Strength training, such as push ups or leg lifts. ? Nutrition education. ? Group classes in which you can talk with others who also have COPD and learn ways to manage stress.  If you use an oxygen tank, you should use it while you exercise. Work with your health care provider to adjust your oxygen for your physical activity. Your resting flow rate is different from your flow rate during physical activity.  While you are exercising: ? Take slow breaths. ? Pace yourself and do not try to go too fast. ? Purse your lips while breathing out. Pursing your lips is similar to a kissing or whistling position. ? If doing exercise that uses a quick burst of effort, such as weight lifting:  Breathe in before starting the exercise.  Breathe out during the hardest part of the exercise (such as raising the weights). Where to find support You can find support for exercising with COPD from:  Your health care provider.  A pulmonary rehabilitation program.  Your local health department or community health programs.  Support groups, online or in-person. Your health care provider may be able to recommend support groups. Where to find more information You can find more information about exercising with COPD from:  American Lung Association: lung.org.  COPD Foundation: copdfoundation.org. Contact a health care provider if:  Your symptoms get worse.  You have chest pain.  You have nausea.  You have a fever.  You have trouble talking or catching your breath.  You want to start a new exercise program or a new activity. Summary  COPD is a general term that can   be used to describe many different lung problems that cause lung swelling (inflammation) and limit airflow. This includes chronic bronchitis and emphysema.  Exercise and physical activity improve your shortness of breath by increasing blood flow (circulation). This causes your heart to provide more  oxygen to your body.  Contact your health care provider before starting any exercise program or new activity. Ask your health care provider what exercises and activities are safe for you. This information is not intended to replace advice given to you by your health care provider. Make sure you discuss any questions you have with your health care provider. Document Revised: 01/18/2019 Document Reviewed: 10/21/2017 Elsevier Patient Education  2020 [employer].   Chronic Obstructive Pulmonary Disease  Chronic obstructive pulmonary disease (COPD) is a long-term (chronic) condition that affects the lungs. COPD is a general term that can be used to describe many different lung problems that cause lung swelling (inflammation) and limit airflow, including chronic bronchitis and emphysema. If you have COPD, your lung function will probably never return to normal. In most cases, it gets worse over time. However, there are steps you can take to slow the progression of the disease and improve your quality of life. What are the causes? This condition may be caused by:  Smoking. This is the most common cause.  Certain genes passed down through families. What increases the risk? The following factors may make you more likely to develop this condition:  Secondhand smoke from cigarettes, pipes, or cigars.  Exposure to chemicals and other irritants such as fumes and dust in the work environment.  Chronic lung conditions or infections. What are the signs or symptoms? Symptoms of this condition include:  Shortness of breath, especially during physical activity.  Chronic cough with a large amount of thick mucus. Sometimes the cough may not have any mucus (dry cough).  Wheezing.  Rapid breaths.  Gray or bluish discoloration (cyanosis) of the skin, especially in your fingers, toes, or lips.  Feeling tired (fatigue).  Weight loss.  Chest tightness.  Frequent infections.  Episodes when  breathing symptoms become much worse (exacerbations).  Swelling in the ankles, feet, or legs. This may occur in later stages of the disease. How is this diagnosed? This condition is diagnosed based on:  Your medical history.  A physical exam. You may also have tests, including:  Lung (pulmonary) function tests. This may include a spirometry test, which measures your ability to exhale properly.  Chest X-ray.  CT scan.  Blood tests. How is this treated? This condition may be treated with:  Medicines. These may include inhaled rescue medicines to treat acute exacerbations as well as long-term, or maintenance, medicines to prevent flare-ups of COPD. ? Bronchodilators help treat COPD by dilating the airways to allow increased airflow and make your breathing more comfortable. ? Steroids can reduce airway inflammation and help prevent exacerbations.  Smoking cessation. If you smoke, your health care provider may ask you to quit, and may also recommend therapy or replacement products to help you quit.  Pulmonary rehabilitation. This may involve working with a team of health care providers and specialists, such as respiratory, occupational, and physical therapists.  Exercise and physical activity. These are beneficial for nearly all people with COPD.  Nutrition therapy to gain weight, if you are underweight.  Oxygen. Supplemental oxygen therapy is only helpful if you have a low oxygen level in your blood (hypoxemia).  Lung surgery or transplant.  Palliative care. This is to help people with  COPD feel comfortable when treatment is no longer working. Follow these instructions at home: Medicines  Take over-the-counter and prescription medicines (inhaled or pills) only as told by your health care provider.  Talk to your health care provider before taking any cough or allergy medicines. You may need to avoid certain medicines that dry out your airways. Lifestyle  If you are a smoker,  the most important thing that you can do is to stop smoking. Do not use any products that contain nicotine or tobacco, such as cigarettes and e-cigarettes. If you need help quitting, ask your health care provider. Continuing to smoke will cause the disease to progress faster.  Avoid exposure to things that irritate your lungs, such as smoke, chemicals, and fumes.  Stay active, but balance activity with periods of rest. Exercise and physical activity will help you maintain your ability to do things you want to do.  Learn and use relaxation techniques to manage stress and to control your breathing.  Get the right amount of sleep and get quality sleep. Most adults need 7 or more hours per night.  Eat healthy foods. Eating smaller, more frequent meals and resting before meals may help you maintain your strength. Controlled breathing Learn and use controlled breathing techniques as directed by your health care provider. Controlled breathing techniques include:  Pursed lip breathing. Start by breathing in (inhaling) through your nose for 1 second. Then, purse your lips as if you were going to whistle and breathe out (exhale) through the pursed lips for 2 seconds.  Diaphragmatic breathing. Start by putting one hand on your abdomen just above your waist. Inhale slowly through your nose. The hand on your abdomen should move out. Then purse your lips and exhale slowly. You should be able to feel the hand on your abdomen moving in as you exhale. Controlled coughing Learn and use controlled coughing to clear mucus from your lungs. Controlled coughing is a series of short, progressive coughs. The steps of controlled coughing are: 1. Lean your head slightly forward. 2. Breathe in deeply using diaphragmatic breathing. 3. Try to hold your breath for 3 seconds. 4. Keep your mouth slightly open while coughing twice. 5. Spit any mucus out into a tissue. 6. Rest and repeat the steps once or twice as  needed. General instructions  Make sure you receive all the vaccines that your health care provider recommends, especially the pneumococcal and influenza vaccines. Preventing infection and hospitalization is very important when you have COPD.  Use oxygen therapy and pulmonary rehabilitation if directed to by your health care provider. If you require home oxygen therapy, ask your health care provider whether you should purchase a pulse oximeter to measure your oxygen level at home.  Work with your health care provider to develop a COPD action plan. This will help you know what steps to take if your condition gets worse.  Keep other chronic health conditions under control as told by your health care provider.  Avoid extreme temperature and humidity changes.  Avoid contact with people who have an illness that spreads from person to person (is contagious), such as viral infections or pneumonia.  Keep all follow-up visits as told by your health care provider. This is important. Contact a health care provider if:  You are coughing up more mucus than usual.  There is a change in the color or thickness of your mucus.  Your breathing is more labored than usual.  Your breathing is faster than usual.  You have  difficulty sleeping.  You need to use your rescue medicines or inhalers more often than expected.  You have trouble doing routine activities such as getting dressed or walking around the house. Get help right away if:  You have shortness of breath while you are resting.  You have shortness of breath that prevents you from: ? Being able to talk. ? Performing your usual physical activities.  You have chest pain lasting longer than 5 minutes.  Your skin color is more blue (cyanotic) than usual.  You measure low oxygen saturations for longer than 5 minutes with a pulse oximeter.  You have a fever.  You feel too tired to breathe normally. Summary  Chronic obstructive pulmonary  disease (COPD) is a long-term (chronic) condition that affects the lungs.  Your lung function will probably never return to normal. In most cases, it gets worse over time. However, there are steps you can take to slow the progression of the disease and improve your quality of life.  Treatment for COPD may include taking medicines, quitting smoking, pulmonary rehabilitation, and changes to diet and exercise. As the disease progresses, you may need oxygen therapy, a lung transplant, or palliative care.  To help manage your condition, do not smoke, avoid exposure to things that irritate your lungs, stay up to date on all vaccines, and follow your health care provider's instructions for taking medicines. This information is not intended to replace advice given to you by your health care provider. Make sure you discuss any questions you have with your health care provider. Document Revised: 09/10/2017 Document Reviewed: 11/02/2016 Elsevier Patient Education  2020 [employer]   Chronic Obstructive Pulmonary Disease Chronic obstructive pulmonary disease (COPD) is a long-term (chronic) lung problem. When you have COPD, it is hard for air to get in and out of your lungs. Usually the condition gets worse over time, and your lungs will never return to normal. There are things you can do to keep yourself as healthy as possible.  Your doctor may treat your condition with: ? Medicines. ? Oxygen. ? Lung surgery.  Your doctor may also recommend: ? Rehabilitation. This includes steps to make your body work better. It may involve a team of specialists. ? Quitting smoking, if you smoke. ? Exercise and changes to your diet. ? Comfort measures (palliative care). Follow these instructions at home: Medicines  Take over-the-counter and prescription medicines only as told by your doctor.  Talk to your doctor before taking any cough or allergy medicines. You may need to avoid medicines that cause your lungs  to be dry. Lifestyle  If you smoke, stop. Smoking makes the problem worse. If you need help quitting, ask your doctor.  Avoid being around things that make your breathing worse. This may include smoke, chemicals, and fumes.  Stay active, but remember to rest as well.  Learn and use tips on how to relax.  Make sure you get enough sleep. Most adults need at least 7 hours of sleep every night.  Eat healthy foods. Eat smaller meals more often. Rest before meals. Controlled breathing Learn and use tips on how to control your breathing as told by your doctor. Try:  Breathing in (inhaling) through your nose for 1 second. Then, pucker your lips and breath out (exhale) through your lips for 2 seconds.  Putting one hand on your belly (abdomen). Breathe in slowly through your nose for 1 second. Your hand on your belly should move out. Pucker your lips and breathe out  slowly through your lips. Your hand on your belly should move in as you breathe out.  Controlled coughing Learn and use controlled coughing to clear mucus from your lungs. Follow these steps: 1. Lean your head a little forward. 2. Breathe in deeply. 3. Try to hold your breath for 3 seconds. 4. Keep your mouth slightly open while coughing 2 times. 5. Spit any mucus out into a tissue. 6. Rest and do the steps again 1 or 2 times as needed. General instructions  Make sure you get all the shots (vaccines) that your doctor recommends. Ask your doctor about a flu shot and a pneumonia shot.  Use oxygen therapy and pulmonary rehabilitation if told by your doctor. If you need home oxygen therapy, ask your doctor if you should buy a tool to measure your oxygen level (oximeter).  Make a COPD action plan with your doctor. This helps you to know what to do if you feel worse than usual.  Manage any other conditions you have as told by your doctor.  Avoid going outside when it is very hot, cold, or humid.  Avoid people who have a sickness  you can catch (contagious).  Keep all follow-up visits as told by your doctor. This is important. Contact a doctor if:  You cough up more mucus than usual.  There is a change in the color or thickness of the mucus.  It is harder to breathe than usual.  Your breathing is faster than usual.  You have trouble sleeping.  You need to use your medicines more often than usual.  You have trouble doing your normal activities such as getting dressed or walking around the house. Get help right away if:  You have shortness of breath while resting.  You have shortness of breath that stops you from: ? Being able to talk. ? Doing normal activities.  Your chest hurts for longer than 5 minutes.  Your skin color is more blue than usual.  Your pulse oximeter shows that you have low oxygen for longer than 5 minutes.  You have a fever.  You feel too tired to breathe normally. Summary  Chronic obstructive pulmonary disease (COPD) is a long-term lung problem.  The way your lungs work will never return to normal. Usually the condition gets worse over time. There are things you can do to keep yourself as healthy as possible.  Take over-the-counter and prescription medicines only as told by your doctor.  If you smoke, stop. Smoking makes the problem worse. This information is not intended to replace advice given to you by your health care provider. Make sure you discuss any questions you have with your health care provider. Document Revised: 09/10/2017 Document Reviewed: 11/02/2016 Elsevier Patient Education  2020 [employer].

## 2020-01-02 NOTE — Progress Notes (Addendum)
All medication administration by Cheri Rous Student Nurse was supervised by this RN Isaak Delmundo A Leonidas Boateng

## 2020-01-02 NOTE — TOC Transition Note (Addendum)
Transition of Care [facility]) - CM/SW Discharge Note   Patient Details  Name: Gary Thomas MRN: [id number] Date of Birth: 1932-02-19  [facility] CM/SW Contact:  Shawn Route, RN Phone Number: 01/02/2020, 10:47 AM   Clinical Narrative:     Patient and wife choice of SNF is [facility].  Verified bed is still available.  MD will discharge today.  Patient to transfer to [facility] via [facility] today.  Discharge packet complete and placed on chart.  Annabelle Harman, RN notified of dc and packet.  Lupita Leash to call report to [facility].  EMS will be called one DC order and summary are documented.  No further TOC needs at this time, please re-consult for new needs.     Final next level of care: Skilled Nursing Facility Barriers to Discharge: Barriers Resolved   Patient Goals and CMS Choice Patient states their goals for this hospitalization and ongoing recovery are:: To get stronger and return hoe with wife. CMS Medicare.gov Compare Post Acute Care list provided to:: Patient Choice offered to / list presented to : Patient, Spouse  Discharge Placement              Patient chooses bed at: [facility] Patient to be transferred to facility by: [facility] Name of family member notified: Spouse Jessica Priest Patient and family notified of of transfer: 01/02/20  Discharge Plan and Services   Discharge Planning Services: CM Consult Post Acute Care Choice: Skilled Nursing Facility                               Social Determinants of Health (SDOH) Interventions     Readmission Risk Interventions Readmission Risk Prevention Plan 02/10/2019  Transportation Screening Complete  Medication Review [employer]) Complete  PCP or Specialist appointment within 3-5 days of discharge Complete  HRI or Home Care Consult Complete  SW Recovery Care/Counseling Consult Complete  Palliative Care Screening Not Applicable  Skilled Nursing Facility Patient  Refused  Some recent data might be hidden

## 2020-05-01 ENCOUNTER — Other Ambulatory Visit: Payer: Self-pay

## 2020-05-01 ENCOUNTER — Inpatient Hospital Stay
Admission: EM | Admit: 2020-05-01 | Discharge: 2020-05-04 | DRG: 193 | Disposition: A | Discharge status: Home Health | Payer: Medicare Other | Attending: Internal Medicine | Admitting: Internal Medicine

## 2020-05-01 ENCOUNTER — Emergency Department: Payer: Medicare Other

## 2020-05-01 DIAGNOSIS — R0902 Hypoxemia: Secondary | ICD-10-CM

## 2020-05-01 DIAGNOSIS — G4730 Sleep apnea, unspecified: Secondary | ICD-10-CM | POA: Diagnosis present

## 2020-05-01 DIAGNOSIS — Z96651 Presence of right artificial knee joint: Secondary | ICD-10-CM | POA: Diagnosis present

## 2020-05-01 DIAGNOSIS — Z87891 Personal history of nicotine dependence: Secondary | ICD-10-CM | POA: Diagnosis not present

## 2020-05-01 DIAGNOSIS — I129 Hypertensive chronic kidney disease with stage 1 through stage 4 chronic kidney disease, or unspecified chronic kidney disease: Secondary | ICD-10-CM | POA: Diagnosis present

## 2020-05-01 DIAGNOSIS — I10 Essential (primary) hypertension: Secondary | ICD-10-CM

## 2020-05-01 DIAGNOSIS — Z86711 Personal history of pulmonary embolism: Secondary | ICD-10-CM | POA: Diagnosis not present

## 2020-05-01 DIAGNOSIS — G4734 Idiopathic sleep related nonobstructive alveolar hypoventilation: Secondary | ICD-10-CM | POA: Diagnosis not present

## 2020-05-01 DIAGNOSIS — E1122 Type 2 diabetes mellitus with diabetic chronic kidney disease: Secondary | ICD-10-CM | POA: Diagnosis present

## 2020-05-01 DIAGNOSIS — Z885 Allergy status to narcotic agent status: Secondary | ICD-10-CM | POA: Diagnosis not present

## 2020-05-01 DIAGNOSIS — K219 Gastro-esophageal reflux disease without esophagitis: Secondary | ICD-10-CM | POA: Diagnosis present

## 2020-05-01 DIAGNOSIS — J181 Lobar pneumonia, unspecified organism: Principal | ICD-10-CM | POA: Diagnosis present

## 2020-05-01 DIAGNOSIS — Z823 Family history of stroke: Secondary | ICD-10-CM

## 2020-05-01 DIAGNOSIS — E785 Hyperlipidemia, unspecified: Secondary | ICD-10-CM | POA: Diagnosis present

## 2020-05-01 DIAGNOSIS — N183 Chronic kidney disease, stage 3 unspecified: Secondary | ICD-10-CM | POA: Diagnosis present

## 2020-05-01 DIAGNOSIS — E1169 Type 2 diabetes mellitus with other specified complication: Secondary | ICD-10-CM | POA: Diagnosis present

## 2020-05-01 DIAGNOSIS — Z8249 Family history of ischemic heart disease and other diseases of the circulatory system: Secondary | ICD-10-CM

## 2020-05-01 DIAGNOSIS — Z20822 Contact with and (suspected) exposure to covid-19: Secondary | ICD-10-CM | POA: Diagnosis present

## 2020-05-01 DIAGNOSIS — N179 Acute kidney failure, unspecified: Secondary | ICD-10-CM | POA: Diagnosis present

## 2020-05-01 DIAGNOSIS — J440 Chronic obstructive pulmonary disease with acute lower respiratory infection: Secondary | ICD-10-CM | POA: Diagnosis present

## 2020-05-01 DIAGNOSIS — E875 Hyperkalemia: Secondary | ICD-10-CM | POA: Diagnosis present

## 2020-05-01 DIAGNOSIS — R413 Other amnesia: Secondary | ICD-10-CM | POA: Diagnosis present

## 2020-05-01 DIAGNOSIS — Z96641 Presence of right artificial hip joint: Secondary | ICD-10-CM | POA: Diagnosis present

## 2020-05-01 DIAGNOSIS — J9601 Acute respiratory failure with hypoxia: Secondary | ICD-10-CM | POA: Diagnosis present

## 2020-05-01 DIAGNOSIS — F329 Major depressive disorder, single episode, unspecified: Secondary | ICD-10-CM | POA: Diagnosis present

## 2020-05-01 DIAGNOSIS — R0602 Shortness of breath: Secondary | ICD-10-CM

## 2020-05-01 DIAGNOSIS — Z79899 Other long term (current) drug therapy: Secondary | ICD-10-CM | POA: Diagnosis not present

## 2020-05-01 DIAGNOSIS — Z8673 Personal history of transient ischemic attack (TIA), and cerebral infarction without residual deficits: Secondary | ICD-10-CM

## 2020-05-01 DIAGNOSIS — J189 Pneumonia, unspecified organism: Secondary | ICD-10-CM | POA: Diagnosis not present

## 2020-05-01 DIAGNOSIS — J441 Chronic obstructive pulmonary disease with (acute) exacerbation: Secondary | ICD-10-CM | POA: Diagnosis present

## 2020-05-01 DIAGNOSIS — N189 Chronic kidney disease, unspecified: Secondary | ICD-10-CM | POA: Diagnosis not present

## 2020-05-01 LAB — BASIC METABOLIC PANEL
Anion gap: 8 (ref 5–15)
BUN: 27 mg/dL — ABNORMAL HIGH (ref 8–23)
CO2: 26 mmol/L (ref 22–32)
Calcium: 9.5 mg/dL (ref 8.9–10.3)
Chloride: 106 mmol/L (ref 98–111)
Creatinine, Ser: 1.13 mg/dL (ref 0.61–1.24)
GFR calc Af Amer: >60 mL/min (ref >60)
GFR calc non Af Amer: 59 mL/min — ABNORMAL LOW (ref >60)
Glucose, Bld: 96 mg/dL (ref 70–99)
Potassium: 5.3 mmol/L — ABNORMAL HIGH (ref 3.5–5.1)
Sodium: 140 mmol/L (ref 135–145)

## 2020-05-01 LAB — CBC
HCT: 45 % (ref 39.0–52.0)
Hemoglobin: 14.1 g/dL (ref 13.0–17.0)
MCH: 29.3 pg (ref 26.0–34.0)
MCHC: 31.3 g/dL (ref 30.0–36.0)
MCV: 93.6 fL (ref 80.0–100.0)
Platelets: 173 10*3/uL (ref 150–400)
RBC: 4.81 MIL/uL (ref 4.22–5.81)
RDW: 12.7 % (ref 11.5–15.5)
WBC: 12.7 10*3/uL — ABNORMAL HIGH (ref 4.0–10.5)
nRBC: 0 % (ref 0.0–0.2)

## 2020-05-01 LAB — TROPONIN I (HIGH SENSITIVITY)
Troponin I (High Sensitivity): 16 ng/L (ref <18)
Troponin I (High Sensitivity): 13 ng/L (ref <18)

## 2020-05-01 LAB — SARS CORONAVIRUS 2 BY RT PCR (HOSPITAL ORDER, PERFORMED IN ~~LOC~~ HOSPITAL LAB): SARS Coronavirus 2: NEGATIVE

## 2020-05-01 LAB — GLUCOSE, CAPILLARY: Glucose-Capillary: 199 mg/dL — ABNORMAL HIGH (ref 70–99)

## 2020-05-01 MED ORDER — ACETAMINOPHEN 650 MG RE SUPP: 650.0000 mg | Freq: Four times a day (QID) | RECTAL | Status: DC | PRN

## 2020-05-01 MED ORDER — ONDANSETRON HCL 4 MG/2ML IJ SOLN: 4.0000 mg | Freq: Four times a day (QID) | INTRAMUSCULAR | Status: DC | PRN

## 2020-05-01 MED ORDER — GUAIFENESIN-DM 100-10 MG/5ML PO SYRP
5.0000 mL | ORAL_SOLUTION | ORAL | Status: DC | PRN
  Administered 2020-05-01: 5 mL via ORAL
  Filled 2020-05-01 (×2): qty 5

## 2020-05-01 MED ORDER — PANTOPRAZOLE SODIUM 40 MG PO TBEC
40.0000 mg | DELAYED_RELEASE_TABLET | Freq: Every day | ORAL | Status: DC
  Administered 2020-05-02 – 2020-05-04 (×3): 40 mg via ORAL
  Filled 2020-05-01 (×3): qty 1

## 2020-05-01 MED ORDER — NYSTATIN 100000 UNIT/GM EX POWD
Freq: Every day | CUTANEOUS | Status: DC | PRN
  Filled 2020-05-01: qty 15

## 2020-05-01 MED ORDER — ONDANSETRON HCL 4 MG PO TABS: 4.0000 mg | ORAL_TABLET | Freq: Four times a day (QID) | ORAL | Status: DC | PRN

## 2020-05-01 MED ORDER — SODIUM CHLORIDE 0.9 % IV SOLN
2.0000 g | INTRAVENOUS | Status: DC
  Administered 2020-05-01 – 2020-05-03 (×3): 2 g via INTRAVENOUS
  Filled 2020-05-01: qty 2
  Filled 2020-05-01 (×3): qty 20

## 2020-05-01 MED ORDER — AZITHROMYCIN 250 MG PO TABS
250.0000 mg | ORAL_TABLET | Freq: Every day | ORAL | Status: DC
  Administered 2020-05-02 – 2020-05-04 (×3): 250 mg via ORAL
  Filled 2020-05-01 (×3): qty 1

## 2020-05-01 MED ORDER — MIRTAZAPINE 15 MG PO TABS
15.0000 mg | ORAL_TABLET | Freq: Every day | ORAL | Status: DC
  Administered 2020-05-01 – 2020-05-03 (×3): 15 mg via ORAL
  Filled 2020-05-01 (×4): qty 1

## 2020-05-01 MED ORDER — IOHEXOL 350 MG/ML SOLN
75.0000 mL | Freq: Once | INTRAVENOUS | Status: AC | PRN
  Administered 2020-05-01: 75 mL via INTRAVENOUS

## 2020-05-01 MED ORDER — DONEPEZIL HCL 5 MG PO TABS
5.0000 mg | ORAL_TABLET | Freq: Every day | ORAL | Status: DC
  Administered 2020-05-01 – 2020-05-03 (×3): 5 mg via ORAL
  Filled 2020-05-01 (×4): qty 1

## 2020-05-01 MED ORDER — INSULIN ASPART 100 UNIT/ML ~~LOC~~ SOLN
0.0000 [IU] | Freq: Three times a day (TID) | SUBCUTANEOUS | Status: DC
  Administered 2020-05-02: 2 [IU] via SUBCUTANEOUS
  Administered 2020-05-02: 1 [IU] via SUBCUTANEOUS
  Administered 2020-05-03: 2 [IU] via SUBCUTANEOUS
  Filled 2020-05-01 (×3): qty 1

## 2020-05-01 MED ORDER — METOPROLOL SUCCINATE ER 50 MG PO TB24
100.0000 mg | ORAL_TABLET | Freq: Every day | ORAL | Status: DC
  Administered 2020-05-02 – 2020-05-04 (×3): 100 mg via ORAL
  Filled 2020-05-01 (×3): qty 2

## 2020-05-01 MED ORDER — BUDESONIDE 0.5 MG/2ML IN SUSP
0.5000 mg | Freq: Two times a day (BID) | RESPIRATORY_TRACT | Status: DC
  Administered 2020-05-01 – 2020-05-04 (×6): 0.5 mg via RESPIRATORY_TRACT
  Filled 2020-05-01 (×7): qty 2

## 2020-05-01 MED ORDER — AMLODIPINE BESYLATE 10 MG PO TABS
10.0000 mg | ORAL_TABLET | Freq: Every day | ORAL | Status: DC
  Administered 2020-05-02 – 2020-05-04 (×3): 10 mg via ORAL
  Filled 2020-05-01 (×3): qty 1

## 2020-05-01 MED ORDER — METHYLPREDNISOLONE SODIUM SUCC 40 MG IJ SOLR
40.0000 mg | Freq: Every day | INTRAMUSCULAR | Status: DC
  Administered 2020-05-02 – 2020-05-04 (×3): 40 mg via INTRAVENOUS
  Filled 2020-05-01 (×3): qty 1

## 2020-05-01 MED ORDER — INSULIN ASPART 100 UNIT/ML ~~LOC~~ SOLN: 0.0000 [IU] | Freq: Every day | SUBCUTANEOUS | Status: DC

## 2020-05-01 MED ORDER — BENZOCAINE 10 % MT GEL
Freq: Four times a day (QID) | OROMUCOSAL | Status: DC | PRN
  Filled 2020-05-01: qty 9

## 2020-05-01 MED ORDER — AZITHROMYCIN 500 MG PO TABS
500.0000 mg | ORAL_TABLET | Freq: Every day | ORAL | Status: AC
  Administered 2020-05-01: 500 mg via ORAL
  Filled 2020-05-01: qty 1

## 2020-05-01 MED ORDER — ACETAMINOPHEN 325 MG PO TABS
650.0000 mg | ORAL_TABLET | Freq: Four times a day (QID) | ORAL | Status: DC | PRN
  Administered 2020-05-03: 650 mg via ORAL
  Filled 2020-05-01: qty 2

## 2020-05-01 MED ORDER — ATORVASTATIN CALCIUM 20 MG PO TABS
20.0000 mg | ORAL_TABLET | Freq: Every day | ORAL | Status: DC
  Administered 2020-05-01 – 2020-05-03 (×3): 20 mg via ORAL
  Filled 2020-05-01 (×5): qty 1

## 2020-05-01 MED ORDER — ENOXAPARIN SODIUM 40 MG/0.4ML ~~LOC~~ SOLN
40.0000 mg | SUBCUTANEOUS | Status: DC
  Administered 2020-05-01 – 2020-05-03 (×3): 40 mg via SUBCUTANEOUS
  Filled 2020-05-01 (×4): qty 0.4

## 2020-05-01 MED ORDER — IPRATROPIUM-ALBUTEROL 0.5-2.5 (3) MG/3ML IN SOLN
3.0000 mL | Freq: Once | RESPIRATORY_TRACT | Status: AC
  Administered 2020-05-01: 3 mL via RESPIRATORY_TRACT
  Filled 2020-05-01: qty 3

## 2020-05-01 MED ORDER — IPRATROPIUM-ALBUTEROL 0.5-2.5 (3) MG/3ML IN SOLN
3.0000 mL | Freq: Four times a day (QID) | RESPIRATORY_TRACT | Status: DC
  Administered 2020-05-01 – 2020-05-03 (×8): 3 mL via RESPIRATORY_TRACT
  Filled 2020-05-01 (×8): qty 3

## 2020-05-01 MED ORDER — LOSARTAN POTASSIUM 25 MG PO TABS: 25.0000 mg | ORAL_TABLET | Freq: Every day | ORAL | Status: DC

## 2020-05-01 MED ORDER — HYDRALAZINE HCL 20 MG/ML IJ SOLN
10.0000 mg | Freq: Four times a day (QID) | INTRAMUSCULAR | Status: DC | PRN
  Administered 2020-05-01: 10 mg via INTRAVENOUS
  Filled 2020-05-01 (×2): qty 0.5

## 2020-05-01 MED ORDER — SODIUM ZIRCONIUM CYCLOSILICATE 10 G PO PACK
10.0000 g | PACK | Freq: Once | ORAL | Status: AC
  Administered 2020-05-01: 10 g via ORAL
  Filled 2020-05-01: qty 1

## 2020-05-01 MED ORDER — SERTRALINE HCL 50 MG PO TABS
100.0000 mg | ORAL_TABLET | Freq: Every day | ORAL | Status: DC
  Administered 2020-05-01 – 2020-05-03 (×3): 100 mg via ORAL
  Filled 2020-05-01 (×4): qty 2

## 2020-05-01 MED ORDER — METHYLPREDNISOLONE SODIUM SUCC 125 MG IJ SOLR
125.0000 mg | Freq: Once | INTRAMUSCULAR | Status: AC
  Administered 2020-05-01: 125 mg via INTRAVENOUS
  Filled 2020-05-01: qty 2

## 2020-05-01 MED ORDER — OCUVITE-LUTEIN PO CAPS
1.0000 | ORAL_CAPSULE | Freq: Every day | ORAL | Status: DC
  Administered 2020-05-01 – 2020-05-04 (×3): 1 via ORAL
  Filled 2020-05-01 (×4): qty 1

## 2020-05-01 NOTE — ED Provider Notes (Signed)
[facility] Emergency Department Provider Note   ____________________________________________    I have reviewed the triage vital signs and the nursing notes.   HISTORY  Chief Complaint Shortness of Breath     HPI Gary Thomas is a 86 y.o. male with history of diabetes, PE, pneumonia, COPD who presents with shortness of breath.  Patient reports symptoms started last night, seem to be worse this morning.  Denies fevers.  Does have a history of pneumonia, does not appear to be on blood thinners.  No chest pain nausea or vomiting.  Denies pleurisy.  Quit smoking when he was 86 years old.  No recent travel.  Has had both Covid vaccines.  Past Medical History:  Diagnosis Date  . Depression   . Diabetes mellitus without complication (HCC)   . Dyspnea   . Elevated lipids   . Femur fracture (HCC) 08/22/2018  . GERD (gastroesophageal reflux disease)   . Hypertension   . Pulmonary emboli (HCC)   . Renal insufficiency    only has 1 kidney  . Sleep apnea   . Stroke (HCC)    Rt side weakness    Patient Active Problem List   Diagnosis Date Noted  . Hyperkalemia   . COPD exacerbation (HCC)   . Hypokalemia   . HCAP (healthcare-associated pneumonia) 12/30/2019  . Pneumonia 12/29/2019  . Community acquired pneumonia 11/17/2019  . Acute hypoxemic respiratory failure (HCC) 11/17/2019  . GI bleed 02/07/2019  . Postoperative wound infection 10/17/2018  . Supracondylar fracture of distal end of femur without intracondylar extension (HCC) 10/17/2018  . Sepsis (HCC) 10/06/2018  . Chronic pulmonary embolism (HCC) 09/06/2018  . Primary osteoarthritis of right knee 09/06/2018  . Status post right knee replacement 09/06/2018  . Dyslipidemia associated with type 2 diabetes mellitus (HCC) 09/06/2018  . CKD stage 3 due to type 2 diabetes mellitus (HCC) 09/06/2018  . Controlled type 2 diabetes mellitus with stage 3 chronic kidney disease, with long-term current  use of insulin (HCC) 09/06/2018  . Chronic cerebrovascular accident (CVA) 09/06/2018  . GERD without esophagitis 09/06/2018  . Closed nondisplaced supracondylar fracture of distal end of femur without intracondylar extension (HCC) 09/06/2018  . Acute on chronic anemia 09/06/2018  . Depression, major, single episode, in partial remission (HCC) 09/06/2018  . Bilateral lower extremity edema 09/06/2018  . Chronic gout due to renal impairment 09/06/2018  . COPD with respiratory failure, acute (HCC) 08/28/2018  . Spondylolisthesis of lumbar region 02/15/2018  . PAD (peripheral artery disease) (HCC) 08/19/2017  . Hyperlipidemia 07/05/2017  . Essential hypertension 07/05/2017  . Bilateral carotid artery stenosis 07/05/2017  . Primary localized osteoarthritis of right hip 03/02/2017    Past Surgical History:  Procedure Laterality Date  . 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 AORTA SURGERY    . CAROTID ARTERY - SUBCLAVIAN ARTERY BYPASS GRAFT Bilateral   . CATARACT EXTRACTION W/ INTRAOCULAR LENS  IMPLANT, BILATERAL    . CHOLECYSTECTOMY    . EYE SURGERY    . FEMUR FRACTURE SURGERY Right   . HIP FRACTURE SURGERY Bilateral   . I & D EXTREMITY Right 10/18/2018   Procedure: IRRIGATION AND DEBRIDEMENT EXTREMITY, POSSIBLE HARDWARE REMOVAL OF SUPERCONDYLAR PLATE;  Surgeon: Christena FlakePoggi, John J, MD;  Location: [facility] ORS;  Service: Orthopedics;  Laterality: Right;  . JOINT REPLACEMENT    . ORIF FEMUR FRACTURE Right 08/24/2018   Procedure: OPEN REDUCTION INTERNAL FIXATION (ORIF) SUPRACONDYLAR FEMUR FRACTURE;  Surgeon: Christena Flake, MD;  Location: [facility] ORS;  Service: Orthopedics;  Laterality: Right;  . ORIF FEMUR FRACTURE Right 10/18/2018   Procedure: HARDWARE REMOVAL- DISTAL FEMUR FRACTURE FIXATION;  Surgeon: Christena Flake, MD;  Location: [facility] ORS;  Service: Orthopedics;  Laterality: Right;  . PROSTATE SURGERY    . REPLACEMENT TOTAL KNEE Right   . TOTAL HIP ARTHROPLASTY  Right 03/02/2017   Procedure: TOTAL HIP ARTHROPLASTY ANTERIOR APPROACH;  Surgeon: Kennedy Bucker, MD;  Location: [facility] ORS;  Service: Orthopedics;  Laterality: Right;  Marland Kitchen VASCULAR SURGERY      Prior to Admission medications   Medication Sig Start Date End Date Taking? Authorizing Provider  acetaminophen (TYLENOL) 325 MG tablet Take 2 tablets (650 mg total) by mouth every 6 (six) hours as needed for mild pain (or Fever >/= 101). 08/30/18   Gouru, Deanna Artis, MD  albuterol (PROVENTIL) (2.5 MG/3ML) 0.083% nebulizer solution Take 3 mLs (2.5 mg total) by nebulization every 4 (four) hours as needed for wheezing or shortness of breath. 09/14/18   Sharee Holster, NP  amLODipine (NORVASC) 10 MG tablet Take 1 tablet (10 mg total) by mouth daily. 01/03/20   Lynn Ito, MD  atorvastatin (LIPITOR) 20 MG tablet Take 1 tablet (20 mg total) by mouth at bedtime. 09/14/18   Sharee Holster, NP  donepezil (ARICEPT) 5 MG tablet Take 5 mg by mouth at bedtime. 03/07/20   [provider]  losartan (COZAAR) 25 MG tablet Take 25 mg by mouth daily. 04/01/20   [provider]  megestrol (MEGACE) 40 MG/ML suspension Take 400 mg by mouth daily. 12/07/19   [provider]  metoprolol succinate (TOPROL-XL) 100 MG 24 hr tablet Take 100 mg by mouth daily. 03/12/20   [provider]  mirtazapine (REMERON) 15 MG tablet Take 15 mg by mouth at bedtime.    [provider]  multivitamin-lutein (OCUVITE-LUTEIN) CAPS capsule Take 1 capsule by mouth daily.    [provider]  nystatin (MYCOSTATIN/NYSTOP) powder Apply topically as directed. Apply with each ostomy change as instructed    [provider]  omeprazole (PRILOSEC) 40 MG capsule Take 1 capsule (40 mg total) by mouth daily. 01/02/20   Lynn Ito, MD  sertraline (ZOLOFT) 100 MG tablet Take 1 tablet (100 mg total) by mouth at bedtime. 09/14/18   Sharee Holster, NP  tiotropium (SPIRIVA) 18 MCG inhalation capsule Place 18 mcg into  inhaler and inhale daily. 08/30/18   [provider]  Vitamin D, Ergocalciferol, (DRISDOL) 1.25 MG (50000 UNIT) CAPS capsule Take 50,000 Units by mouth once a week. 11/28/19   [provider]     Allergies Codeine, Morphine, Gabapentin, Stadol [butorphanol], Lyrica [pregabalin], Tapentadol, and Tramadol  Family History  Problem Relation Age of Onset  . Heart attack Father   . Stroke Father   . Heart disease Father   . Heart attack Mother     Social History Social History   Tobacco Use  . Smoking status: Former Smoker    Packs/day: 1.00    Years: 7.00    Pack years: 7.00    Types: Cigarettes    Quit date: 02/18/1952    Years since quitting: 68.2  . Smokeless tobacco: Former Clinical biochemist  . Vaping Use: Never used  Substance Use Topics  . Alcohol use: No  . Drug use: No    Review of Systems  Constitutional: No fever/chills Eyes: No visual changes.  ENT: No sore throat. Cardiovascular: Denies chest  pain. Respiratory: As above Gastrointestinal: No abdominal pain.    Genitourinary: Negative for dysuria. Musculoskeletal: Negative for back pain. Skin: Negative for rash. Neurological: Negative for headaches    ____________________________________________   PHYSICAL EXAM:  VITAL SIGNS: ED Triage Vitals  Enc Vitals Group     BP 05/01/20 1117 (!) 142/95     Pulse Rate 05/01/20 1117 78     Resp 05/01/20 1117 18     Temp 05/01/20 1117 97.9 F (36.6 C)     Temp Source 05/01/20 1117 Oral     SpO2 05/01/20 1117 (!) 84 %     Weight 05/01/20 1119 108.9 kg (240 lb)     Height 05/01/20 1119 1.753 m (5\' 9" )     Head Circumference --      Peak Flow --      Pain Score 05/01/20 1119 5     Pain Loc --      Pain Edu? --      Excl. in GC? --     Constitutional: Alert and oriented.   Nose: No congestion/rhinnorhea. Mouth/Throat: Mucous membranes are moist.    Cardiovascular: Normal rate, regular rhythm.  Good peripheral circulation. Respiratory:  Mild tachypnea.  No retractions.  Scattered wheezing Gastrointestinal: Soft and nontender. No distention.  No CVA tenderness.  Musculoskeletal:   Warm and well perfused Neurologic:  Normal speech and language. No gross focal neurologic deficits are appreciated.  Skin:  Skin is warm, dry and intact. No rash noted. Psychiatric: Mood and affect are normal. Speech and behavior are normal.  ____________________________________________   LABS (all labs ordered are listed, but only abnormal results are displayed)  Labs Reviewed  BASIC METABOLIC PANEL - Abnormal; Notable for the following components:      Result Value   Potassium 5.3 (*)    BUN 27 (*)    GFR calc non Af Amer 59 (*)    All other components within normal limits  CBC - Abnormal; Notable for the following components:   WBC 12.7 (*)    All other components within normal limits  TROPONIN I (HIGH SENSITIVITY)  TROPONIN I (HIGH SENSITIVITY)   ____________________________________________  EKG  ED ECG REPORT I, 05/03/20, the attending physician, personally viewed and interpreted this ECG.  Date: 05/01/2020  Rhythm: normal sinus rhythm QRS Axis: normal Intervals: Abnormal ST/T Wave abnormalities: normal Narrative Interpretation: no evidence of acute ischemia  ____________________________________________  RADIOLOGY  Chest x-ray reviewed by me, no infiltrate effusion or pneumothorax ____________________________________________   PROCEDURES  Procedure(s) performed: No  Procedures   Critical Care performed: yes  CRITICAL CARE Performed by: 05/03/2020   Total critical care time: 30 minutes  Critical care time was exclusive of separately billable procedures and treating other patients.  Critical care was necessary to treat or prevent imminent or life-threatening deterioration.  Critical care was time spent personally by me on the following activities: development of treatment plan with patient  and/or surrogate as well as nursing, discussions with consultants, evaluation of patient's response to treatment, examination of patient, obtaining history from patient or surrogate, ordering and performing treatments and interventions, ordering and review of laboratory studies, ordering and review of radiographic studies, pulse oximetry and re-evaluation of patient's condition.  ____________________________________________   INITIAL IMPRESSION / ASSESSMENT AND PLAN / ED COURSE  Pertinent labs & imaging results that were available during my care of the patient were reviewed by me and considered in my medical decision making (see chart for details).  Patient presents  with shortness of breath as described above.  He is requiring 2 L to keep his sats above 90%, this is a new oxygen requirement for him.  Differential includes COPD exacerbation, pneumonia, PE, edema  Lab work is notable for mildly elevated white blood cell count.  Chronically elevated troponin of 16, mild elevation of BUN.  Chest x-ray does not appear to demonstrate a acute pneumonia.  Will treat with Solu-Medrol, duo nebs, obtain CT chest to evaluate for pneumonia/PE      ____________________________________________   FINAL CLINICAL IMPRESSION(S) / ED DIAGNOSES  Final diagnoses:  SOB (shortness of breath)        Note:  This document was prepared using Dragon voice recognition software and may include unintentional dictation errors.   Jene Every, MD 05/01/20 1517

## 2020-05-01 NOTE — ED Triage Notes (Signed)
Pt comes into the ED via EMS from home with c/o SOB with O2 sats dropping 84% with home sensor, no home O2. Pt is in NAD. 94%RA for EMS. No fever or other sx. HX of COPD and PNE this year a couple times.

## 2020-05-01 NOTE — ED Provider Notes (Signed)
Patient CT is negative.  He does have a new oxygen requirement.  Likely this is a COPD exacerbation.  We will have to admit him for that.   Arnaldo Natal, MD 05/01/20 1606

## 2020-05-01 NOTE — ED Notes (Signed)
Pt states that he does not wear oxygen at home. Sats were in the 80's with ems. Pt in room on 2L [facility] with sats at 98%.

## 2020-05-01 NOTE — ED Triage Notes (Signed)
Pt states "my breathing gets low" states was in 70's at home and then climbed into 80's. Denies wearing oxygen at home but states "I think I need to." c/o of HA. Pt anywhere from 84-90% on RA in triage. Placed on 2 L [address].

## 2020-05-01 NOTE — H&P (Signed)
[facility] [place] [facility]   PATIENT NAME: Gary Thomas    MR#:  [id number]  DATE OF BIRTH:  May 16, 1932  DATE OF ADMISSION:  05/01/2020  PRIMARY CARE PHYSICIAN: Marguarite Arbour, MD   REQUESTING/REFERRING PHYSICIAN: Dr. Dorothea Glassman  CHIEF COMPLAINT:   Chief Complaint  Patient presents with  . Shortness of Breath    HISTORY OF PRESENT ILLNESS:  Gary Thomas  is a 86 y.o. male coming in with shortness of breath going on for few days.  He states he took his pulse ox at home it was in the 70s.  They called his primary care physician's office and they told him to go to the emergency room.  The patient feels much better once oxygen was placed on him.  He states he has had a headache for 2 to 3 days but it was gone after the oxygen was placed on him.  He has been coughing up clear phlegm.  He has been wheezing.  He has been short of breath.  Some chest discomfort with cough.  Patient's pulse ox was 84% on room air.  Hospitalist services were contacted for further evaluation.  CT scan of the chest was negative for pulmonary embolism but did show a pneumonia.  PAST MEDICAL HISTORY:   Past Medical History:  Diagnosis Date  . Depression   . Diabetes mellitus without complication (HCC)   . Dyspnea   . Elevated lipids   . Femur fracture (HCC) 08/22/2018  . GERD (gastroesophageal reflux disease)   . Hypertension   . Pulmonary emboli (HCC)   . Renal insufficiency    only has 1 kidney  . Sleep apnea   . Stroke (HCC)    Rt side weakness    PAST SURGICAL HISTORY:   Past Surgical History:  Procedure Laterality Date  . 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 AORTA SURGERY    . CAROTID ARTERY - SUBCLAVIAN ARTERY BYPASS GRAFT Bilateral   . CATARACT EXTRACTION W/ INTRAOCULAR LENS  IMPLANT, BILATERAL    . CHOLECYSTECTOMY    . EYE SURGERY    . FEMUR FRACTURE SURGERY Right   . HIP FRACTURE SURGERY Bilateral   . I & D  EXTREMITY Right 10/18/2018   Procedure: IRRIGATION AND DEBRIDEMENT EXTREMITY, POSSIBLE HARDWARE REMOVAL OF SUPERCONDYLAR PLATE;  Surgeon: Christena Flake, MD;  Location: [facility] ORS;  Service: Orthopedics;  Laterality: Right;  . JOINT REPLACEMENT    . ORIF FEMUR FRACTURE Right 08/24/2018   Procedure: OPEN REDUCTION INTERNAL FIXATION (ORIF) SUPRACONDYLAR FEMUR FRACTURE;  Surgeon: Christena Flake, MD;  Location: [facility] OR[facility];  Service: Orthopedics;  Laterality: Right;  . ORIF FEMUR FRACTURE Right 10/18/2018   Procedure: HARDWARE REMOVAL- DISTAL FEMUR FRACTURE FIXATION;  Surgeon: Christena Flake, MD;  Location: [facility] OR[facility];  Service: Orthopedics;  Laterality: Right;  . PROSTATE SURGERY    . REPLACEMENT TOTAL KNEE Right   . TOTAL HIP ARTHROPLASTY Right 03/02/2017   Procedure: TOTAL HIP ARTHROPLASTY ANTERIOR APPROACH;  Surgeon: Kennedy Bucker, MD;  Location: [facility] ORS;  Service: Orthopedics;  Laterality: Right;  Marland Kitchen VASCULAR SURGERY      SOCIAL HISTORY:   Social History   Tobacco Use  . Smoking status: Former Smoker    Packs/day: 1.00    Years: 7.00    Pack years: 7.00    Types: Cigarettes    Quit date: 02/18/1952    Years since quitting: 68.2  . Smokeless tobacco: Former Neurosurgeon  Substance Use Topics  . Alcohol use: No    FAMILY HISTORY:   Family History  Problem Relation Age of Onset  . Heart attack Father   . Stroke Father   . Heart disease Father   . Heart attack Mother     DRUG ALLERGIES:   Allergies  Allergen Reactions  . Codeine Anaphylaxis  . Morphine Anaphylaxis  . Gabapentin Other (See Comments)    Unsure of what the reaction was   . Stadol [Butorphanol]     Unknown  . Lyrica [Pregabalin] Rash  . Tapentadol Nausea Only    ONSET 01/04/2013  . Tramadol     Hallucinations "I see giant spiders on ceiling". Pt also believes he is in [facility], but he is in [place].    REVIEW OF SYSTEMS:  CONSTITUTIONAL: No fever, chills or sweats.  Positive for weakness.  EYES: Wears glasses. EARS, NOSE,  AND THROAT: No tinnitus or ear pain.  Positive for runny nose and sore throat RESPIRATORY: Positive for cough, shortness of breath, and wheezing.   CARDIOVASCULAR: No chest pain.  GASTROINTESTINAL: No nausea, vomiting, diarrhea or abdominal pain. No blood in bowel movements GENITOURINARY: Positive for hematuria.  He states he had a urology work-up as outpatient. ENDOCRINE: No polyuria, nocturia,  HEMATOLOGY: No anemia, easy bruising or bleeding SKIN: No rash or lesion. MUSCULOSKELETAL: No joint pain or arthritis.   NEUROLOGIC: No tingling, numbness, weakness.  PSYCHIATRY: History of depression.   MEDICATIONS AT HOME:   Prior to Admission medications   Medication Sig Start Date End Date Taking? Authorizing Provider  acetaminophen (TYLENOL) 325 MG tablet Take 2 tablets (650 mg total) by mouth every 6 (six) hours as needed for mild pain (or Fever >/= 101). 08/30/18   Gouru, Deanna Artis, MD  albuterol (PROVENTIL) (2.5 MG/3ML) 0.083% nebulizer solution Take 3 mLs (2.5 mg total) by nebulization every 4 (four) hours as needed for wheezing or shortness of breath. 09/14/18   Sharee Holster, NP  amLODipine (NORVASC) 10 MG tablet Take 1 tablet (10 mg total) by mouth daily. 01/03/20   Lynn Ito, MD  atorvastatin (LIPITOR) 20 MG tablet Take 1 tablet (20 mg total) by mouth at bedtime. 09/14/18   Sharee Holster, NP  donepezil (ARICEPT) 5 MG tablet Take 5 mg by mouth at bedtime. 03/07/20   [provider]  losartan (COZAAR) 25 MG tablet Take 25 mg by mouth daily. 04/01/20   [provider]  megestrol (MEGACE) 40 MG/ML suspension Take 400 mg by mouth daily. 12/07/19   [provider]  metoprolol succinate (TOPROL-XL) 100 MG 24 hr tablet Take 100 mg by mouth daily. 03/12/20   [provider]  mirtazapine (REMERON) 15 MG tablet Take 15 mg by mouth at bedtime.    [provider]  multivitamin-lutein (OCUVITE-LUTEIN) CAPS capsule Take 1 capsule by mouth daily.    [provider]  nystatin (MYCOSTATIN/NYSTOP) powder Apply topically as directed. Apply with each ostomy change as instructed    [provider]  omeprazole (PRILOSEC) 40 MG capsule Take 1 capsule (40 mg total) by mouth daily. 01/02/20   Lynn Ito, MD  sertraline (ZOLOFT) 100 MG tablet Take 1 tablet (100 mg total) by mouth at bedtime. 09/14/18   Sharee Holster, NP  tiotropium (SPIRIVA) 18 MCG inhalation capsule Place 18 mcg into inhaler and inhale daily. 08/30/18   [provider]  Vitamin D, Ergocalciferol, (DRISDOL) 1.25 MG (50000 UNIT) CAPS capsule Take 50,000 Units by mouth once a  week. 11/28/19   [provider]      VITAL SIGNS:  Blood pressure (!) 166/64, pulse 93, temperature 97.9 F (36.6 C), temperature source Oral, resp. rate (!) 23, height 5\' 9"  (1.753 m), weight 108.9 kg, SpO2 97 %.  PHYSICAL EXAMINATION:  GENERAL:  86 y.o.-year-old patient lying in the bed with no acute distress.  Audible wheeze heard. EYES: Pupils equal, round, reactive to light and accommodation. No scleral icterus. Extraocular muscles intact.  HEENT: Head atraumatic, normocephalic. Oropharynx and nasopharynx clear.  NECK:  Supple, no jugular venous distention. LUNGS: Normal breath sounds bilaterally, audible wheeze heard.  No rales,rhonchi or crepitation. No use of accessory muscles of respiration.  CARDIOVASCULAR: S1, S2 normal. No murmurs, rubs, or gallops.  ABDOMEN: Soft, nontender, nondistended. Bowel sounds present. No organomegaly or mass.  EXTREMITIES: No pedal edema, cyanosis, or clubbing.  NEUROLOGIC: Cranial nerves II through XII are intact. Muscle strength 5/5 in all extremities. Sensation intact. Gait not checked.  PSYCHIATRIC: The patient is alert and oriented x 3.  SKIN: No rash, lesion, or ulcer.   LABORATORY PANEL:   CBC Recent Labs  Lab 05/01/20 1129  WBC 12.7*  HGB 14.1  HCT 45.0  PLT 173    ------------------------------------------------------------------------------------------------------------------  Chemistries  Recent Labs  Lab 05/01/20 1129  NA 140  K 5.3*  CL 106  CO2 26  GLUCOSE 96  BUN 27*  CREATININE 1.13  CALCIUM 9.5   ------------------------------------------------------------------------------------------------------------------  Cardiac Enzymes 2 high-sensitivity troponins negative.  RADIOLOGY:  DG Chest 2 View  Result Date: 05/01/2020 CLINICAL DATA:  Hypoxia EXAM: CHEST - 2 VIEW COMPARISON:  12/29/2019 chest radiograph and CT chest. 01/31/2020 chest radiograph. FINDINGS: Hypoinflated lungs with chronic right hemidiaphragm elevation. Bibasilar opacities, likely atelectasis. Blunting of the right costophrenic sulcus likely reflects scarring versus trace pleural effusion. No pneumothorax. Partially obscured cardiomediastinal silhouette. Bilateral neck and medial right apex postsurgical sequela. Multilevel spondylosis. Left upper quadrant surgical clips. External radiopaque foreign body overlying the posterior body wall at the thoracolumbar junction. IMPRESSION: No acute airspace disease.  Bibasilar atelectasis. Chronic right hemidiaphragm elevation and right basilar scarring is unchanged. Electronically Signed   By: Stana Buntinghikanele  Emekauwa M.D.   On: 05/01/2020 12:35   CT Angio Chest PE W and/or Wo Contrast  Result Date: 05/01/2020 CLINICAL DATA:  Shortness of breath EXAM: CT ANGIOGRAPHY CHEST WITH CONTRAST TECHNIQUE: Multidetector CT imaging of the chest was performed using the standard protocol during bolus administration of intravenous contrast. Multiplanar CT image reconstructions and MIPs were obtained to evaluate the vascular anatomy. CONTRAST:  75mL OMNIPAQUE IOHEXOL 350 MG/ML SOLN COMPARISON:  CT chest 12/29/2019 FINDINGS: Cardiovascular: Satisfactory opacification of the pulmonary arteries to the segmental level. No evidence of pulmonary embolism.  Dilated main pulmonary artery reflecting pulmonary arterial hypertension. Normal heart size. No pericardial effusion. Coronary artery and thoracic aortic calcification. Mediastinum/Nodes: No new enlarged lymph nodes. Stable appearance of thyroid. Lungs/Pleura: Persistent elevation of the right hemidiaphragm. Increased right basilar patchy density. Persistent patchy left lower lobe ground-glass density. No pleural effusion or pneumothorax. Upper Abdomen: No acute abnormality. Multiple hepatic cysts again seen. Surgical clips at the expected location of the left adrenal. Partially imaged chronic severe left hydronephrosis with cortical thinning. Musculoskeletal: No acute osseous abnormality. Review of the MIP images confirms the above findings. IMPRESSION: No evidence of acute pulmonary embolism. Persistent elevation of the right hemidiaphragm. Increased density at the lung bases favored to reflect atelectasis. Persistent patchy ground-glass density in the superior segment of the left lower lobe.  Electronically Signed   By: Guadlupe Spanish M.D.   On: 05/01/2020 16:41    EKG:   Normal sinus rhythm 77 bpm, left axis deviation, Q waves inferiorly and anteriorly  IMPRESSION AND PLAN:   1.  Acute hypoxic respiratory failure with a pulse ox of 84% on room air.  Continue oxygen supplementation and try to taper off. 2.  Left lower lobe pneumonia with asthmatic bronchitis versus COPD exacerbation.  Continue Solu-Medrol 40 mg IV daily and nebulizer treatments budesonide and DuoNeb.  Start Rocephin and Zithromax. 3.  Previous nonhemorrhagic stroke 4.  Hyperkalemia we will give a dose of Lokelma and recheck potassium tomorrow. 5.  Essential hypertension on metoprolol Norvasc Cozaar 6.  Type 2 diabetes mellitus with hyperlipidemia on atorvastatin now on diet control. 7.  Mild memory loss on Aricept 8.  GERD on Prilosec 9.  Depression on Zoloft   All the records, laboratory and radiological data are reviewed and  case discussed with ED provider. Management plans discussed with the patient, family and they are in agreement.  The patient meets inpatient status criteria because of acute hypoxic respiratory failure with pneumonia seen on CT scan of the chest.  Patient also has asthmatic bronchitis.  CODE STATUS: Full code  TOTAL TIME TAKING CARE OF THIS PATIENT: 50 minutes.    Alford Highland M.D on 05/01/2020 at 4:54 PM  Between 7am to 6pm - Pager - [phone]  After 6pm call admission pager [phone]  [facility]  CC: Primary care physician; Marguarite Arbour, MD

## 2020-05-02 DIAGNOSIS — N179 Acute kidney failure, unspecified: Secondary | ICD-10-CM

## 2020-05-02 DIAGNOSIS — N189 Chronic kidney disease, unspecified: Secondary | ICD-10-CM

## 2020-05-02 LAB — POTASSIUM: Potassium: 5.3 mmol/L — ABNORMAL HIGH (ref 3.5–5.1)

## 2020-05-02 LAB — CBC
HCT: 40 % (ref 39.0–52.0)
Hemoglobin: 12.9 g/dL — ABNORMAL LOW (ref 13.0–17.0)
MCH: 29.5 pg (ref 26.0–34.0)
MCHC: 32.3 g/dL (ref 30.0–36.0)
MCV: 91.3 fL (ref 80.0–100.0)
Platelets: 141 10*3/uL — ABNORMAL LOW (ref 150–400)
RBC: 4.38 MIL/uL (ref 4.22–5.81)
RDW: 12.8 % (ref 11.5–15.5)
WBC: 10.2 10*3/uL (ref 4.0–10.5)
nRBC: 0 % (ref 0.0–0.2)

## 2020-05-02 LAB — GLUCOSE, CAPILLARY
Glucose-Capillary: 134 mg/dL — ABNORMAL HIGH (ref 70–99)
Glucose-Capillary: 172 mg/dL — ABNORMAL HIGH (ref 70–99)
Glucose-Capillary: 141 mg/dL — ABNORMAL HIGH (ref 70–99)

## 2020-05-02 LAB — BASIC METABOLIC PANEL
Anion gap: 6 (ref 5–15)
BUN: 30 mg/dL — ABNORMAL HIGH (ref 8–23)
CO2: 26 mmol/L (ref 22–32)
Calcium: 8.9 mg/dL (ref 8.9–10.3)
Chloride: 108 mmol/L (ref 98–111)
Creatinine, Ser: 1.56 mg/dL — ABNORMAL HIGH (ref 0.61–1.24)
GFR calc Af Amer: 46 mL/min — ABNORMAL LOW (ref >60)
GFR calc non Af Amer: 40 mL/min — ABNORMAL LOW (ref >60)
Glucose, Bld: 135 mg/dL — ABNORMAL HIGH (ref 70–99)
Potassium: 5.7 mmol/L — ABNORMAL HIGH (ref 3.5–5.1)
Sodium: 140 mmol/L (ref 135–145)

## 2020-05-02 LAB — HEMOGLOBIN A1C
Hgb A1c MFr Bld: 5.8 % — ABNORMAL HIGH (ref 4.8–5.6)
Mean Plasma Glucose: 119.76 mg/dL

## 2020-05-02 MED ORDER — INSULIN ASPART 100 UNIT/ML IV SOLN
10.0000 [IU] | Freq: Once | INTRAVENOUS | Status: AC
  Administered 2020-05-02: 10 [IU] via INTRAVENOUS
  Filled 2020-05-02: qty 0.1

## 2020-05-02 MED ORDER — SODIUM ZIRCONIUM CYCLOSILICATE 10 G PO PACK
10.0000 g | PACK | Freq: Two times a day (BID) | ORAL | Status: DC
  Administered 2020-05-02 – 2020-05-04 (×5): 10 g via ORAL
  Filled 2020-05-02 (×7): qty 1

## 2020-05-02 MED ORDER — FLUTICASONE PROPIONATE 50 MCG/ACT NA SUSP
2.0000 | Freq: Every day | NASAL | Status: DC
  Administered 2020-05-03 – 2020-05-04 (×2): 2 via NASAL
  Filled 2020-05-02: qty 16

## 2020-05-02 MED ORDER — CALCIUM GLUCONATE-NACL 1-0.675 GM/50ML-% IV SOLN
1.0000 g | Freq: Once | INTRAVENOUS | Status: AC
  Administered 2020-05-02: 1000 mg via INTRAVENOUS
  Filled 2020-05-02: qty 50

## 2020-05-02 MED ORDER — SODIUM BICARBONATE 8.4 % IV SOLN
50.0000 meq | Freq: Once | INTRAVENOUS | Status: AC
  Administered 2020-05-02: 50 meq via INTRAVENOUS
  Filled 2020-05-02: qty 50

## 2020-05-02 MED ORDER — SODIUM CHLORIDE 0.9 % IV SOLN: INTRAVENOUS | Status: DC

## 2020-05-02 MED ORDER — DEXTROSE 50 % IV SOLN
1.0000 | Freq: Once | INTRAVENOUS | Status: AC
  Administered 2020-05-02: 50 mL via INTRAVENOUS
  Filled 2020-05-02: qty 50

## 2020-05-02 NOTE — Plan of Care (Signed)
  Problem: Education: Goal: Knowledge of General Education information will improve Description: Including pain rating scale, medication(s)/side effects and non-pharmacologic comfort measures Outcome: Progressing   Problem: Health Behavior/Discharge Planning: Goal: Ability to manage health-related needs will improve Outcome: Progressing   Problem: Clinical Measurements: Goal: Ability to maintain clinical measurements within normal limits will improve Outcome: Progressing Goal: Will remain free from infection Outcome: Progressing Goal: Diagnostic test results will improve Outcome: Progressing Goal: Respiratory complications will improve Outcome: Progressing Goal: Cardiovascular complication will be avoided Outcome: Progressing   Problem: Activity: Goal: Risk for activity intolerance will decrease Outcome: Progressing   Problem: Elimination: Goal: Will not experience complications related to bowel motility Outcome: Progressing   Problem: Pain Managment: Goal: General experience of comfort will improve Outcome: Progressing   Problem: Safety: Goal: Ability to remain free from injury will improve Outcome: Progressing   

## 2020-05-02 NOTE — Evaluation (Signed)
Physical Therapy Evaluation Patient Details Name: Gary Thomas MRN: [id number] DOB: September 07, 1932 Today's Date: 05/02/2020   History of Present Illness  86 y.o. male coming in with shortness of breath going on for few days.  He states he took his pulse ox at home it was in the 70s.  They called his primary care physician's office and they told him to go to the emergency room.  The patient feels much better once oxygen was placed on him.  He states he has had a headache for 2 to 3 days but it was gone after the oxygen was placed on him.  C/o weezing and shortness of breath, imaging negative for PE, admitted with pneumonia and COPD exacerbation.  Clinical Impression  Pt reports that he is feeling weak from being inactive the last 24 hours, but was eager to participate with PT and do what he could to show that he is able to go home (adamant about not going to rehab.)  He did well with bed mobility, though he did require heavy UE use on rails (has hospital bed at home, this is close to baseline).  He reports he does not necessarily stand with walker to pivot a lot but can/does do it some times.  He was unable to rise to full standing this date despite much assist, elevated surface and multiple attempts.  He did, however, transfer to/from recliner with slide board and only light assist to place slide board under bottom.  He states he has a Pensions consultant board at home that is showing it's age, hopes to be able to get an official one at discharge.  Overall pt did well, though he is weaker than his baseline, eager to the idea of having PT come to the home to work with him, fortunately he did well enough that home is a safe option as he unequivocally refused to consider rehab.    Follow Up Recommendations Home health PT;Supervision - Intermittent (Pt adamant that he is not going to rehab)    Equipment Recommendations   (long slide board (+250 lbs))    Recommendations for Other Services       Precautions  / Restrictions Precautions Precautions: Fall Restrictions Weight Bearing Restrictions: No      Mobility  Bed Mobility Overal bed mobility: Modified Independent             General bed mobility comments: With heavy use of rails pt was able to get to sitting from supine w/o assist.  Able to get LEs back up into bed and then scoot up in bed w/o assist  Transfers Overall transfer level: Needs assistance Equipment used: Rolling walker (2 wheeled) Transfers: Sit to/from Stand;Lateral/Scoot Transfers Sit to Stand: Max assist        Lateral/Scoot Transfers: Min guard (with slide board, to recliner and back to bed) General transfer comment: 3 attempts at standing EOB with elevating surface and increased assist each time.  Unable to fully attain standing despite great effort.   Ambulation/Gait                Stairs            Wheelchair Mobility    Modified Rankin (Stroke Patients Only)       Balance Overall balance assessment: Needs assistance Sitting-balance support: No upper extremity supported Sitting balance-Leahy Scale: Good     Standing balance support: Bilateral upper extremity supported Standing balance-Leahy Scale: Poor Standing balance comment: pt unable to attain/maintain standing even with  very heavy assist                             Pertinent Vitals/Pain Pain Assessment: No/denies pain    Home Living Family/patient expects to be discharged to:: Private residence Living Arrangements: Spouse/significant other Available Help at Discharge: Family Type of Home: House Home Access: Ramped entrance     Home Layout: One level Home Equipment: Environmental consultant - 2 wheels;Cane - single point;Shower seat;Bedside commode;Hospital bed;Wheelchair - IT trainer      Prior Function Level of Independence: Independent with assistive device(s)         Comments: Pt reports that he uses a homemade slide board and can get to/from wc/bed  w/o assist.  Wife assists with bathing/toileting clean up, some ADLs but he reports relative indepence with mobility, etc     Hand Dominance        Extremity/Trunk Assessment   Upper Extremity Assessment Upper Extremity Assessment: Overall WFL for tasks assessed;Generalized weakness    Lower Extremity Assessment Lower Extremity Assessment: Overall WFL for tasks assessed;Generalized weakness       Communication   Communication: No difficulties  Cognition Arousal/Alertness: Awake/alert Behavior During Therapy: WFL for tasks assessed/performed Overall Cognitive Status: Within Functional Limits for tasks assessed                                        General Comments General comments (skin integrity, edema, etc.): Pt reports that he probably has only stood with his walker 3x in the last month or so.  But is confident with transfers to/from wheel chair.    Exercises     Assessment/Plan    PT Assessment Patient needs continued PT services  PT Problem List Decreased strength;Decreased range of motion;Decreased activity tolerance;Decreased balance;Decreased mobility;Decreased coordination;Decreased knowledge of use of DME;Decreased safety awareness       PT Treatment Interventions DME instruction;Gait training;Functional mobility training;Therapeutic activities;Therapeutic exercise;Balance training;Neuromuscular re-education;Patient/family education;Cognitive remediation    PT Goals (Current goals can be found in the Care Plan section)  Acute Rehab PT Goals Patient Stated Goal: Go home, "no way" he would consider rehab PT Goal Formulation: With patient Time For Goal Achievement: 05/16/20 Potential to Achieve Goals: Fair    Frequency Min 2X/week   Barriers to discharge        Co-evaluation               AM-PAC PT "6 Clicks" Mobility  Outcome Measure Help needed turning from your back to your side while in a flat bed without using bedrails?:  None Help needed moving from lying on your back to sitting on the side of a flat bed without using bedrails?: None Help needed moving to and from a bed to a chair (including a wheelchair)?: A Little Help needed standing up from a chair using your arms (e.g., wheelchair or bedside chair)?: A Lot Help needed to walk in hospital room?: Total Help needed climbing 3-5 steps with a railing? : Total 6 Click Score: 15    End of Session Equipment Utilized During Treatment: Gait belt Activity Tolerance: Patient tolerated treatment well;Patient limited by fatigue Patient left: with bed alarm set;with call bell/phone within reach Nurse Communication: Mobility status PT Visit Diagnosis: Muscle weakness (generalized) (M62.81);Difficulty in walking, not elsewhere classified (R26.2)    Time: 3329-5188 PT Time Calculation (min) (ACUTE ONLY): 40 min  Charges:   PT Evaluation $PT Eval Low Complexity: 1 Low PT Treatments $Therapeutic Activity: 8-22 mins        Malachi Pro, DPT 05/02/2020, 5:14 PM

## 2020-05-02 NOTE — Progress Notes (Signed)
OT Cancellation Note  Patient Details Name: Gary Thomas MRN: [id number] DOB: 20-Apr-1932   Cancelled Treatment:    Reason Eval/Treat Not Completed: Medical issues which prohibited therapy. Consult received, chart reviewed. Pt noted with K+ of 5.7, falling outside guidelines for participation in therapy. Will re-attempt at later date/time as pt is medically appropriate.   Richrd Prime, MPH, MS, OTR/L ascom [phone] 05/02/20, 8:08 AM

## 2020-05-02 NOTE — Progress Notes (Signed)
Patient ID: Gary Thomas, male   DOB: 31-May-1932, 86 y.o.   MRN: [id number] [facility] PROGRESS NOTE  Gary DukeDanford R Redel HYQ:[id number] DOB: 31-May-1932 DOA: 05/01/2020 PCP: Marguarite ArbourSparks, [name], MD  HPI/Subjective: Patient coming in with shortness of breath.  He stated he felt better once the oxygen was placed.  Cough better after starting some cough drops last night.  Has some postnasal drip.  CT scan showing pneumonia.  Objective: Vitals:   05/02/20 0823 05/02/20 1011  BP:  (!) 136/55  Pulse:  89  Resp:  18  Temp:  (!) 97.5 F (36.4 C)  SpO2: 98% 98%    Intake/Output Summary (Last 24 hours) at 05/02/2020 1325 Last data filed at 05/02/2020 0940 Gross per 24 hour  Intake 120 ml  Output 500 ml  Net -380 ml   Filed Weights   05/01/20 1119  Weight: 108.9 kg    ROS: Review of Systems  Respiratory: Positive for cough and shortness of breath.   Cardiovascular: Negative for chest pain.  Gastrointestinal: Negative for abdominal pain.   Exam: Physical Exam HENT:     Head: Normocephalic.     Nose: No mucosal edema.     Mouth/Throat:     Pharynx: No oropharyngeal exudate.  Eyes:     General: Lids are normal.     Conjunctiva/sclera: Conjunctivae normal.     Pupils: Pupils are equal, round, and reactive to light.  Cardiovascular:     Rate and Rhythm: Normal rate and regular rhythm.     Heart sounds: Normal heart sounds, S1 normal and S2 normal.  Pulmonary:     Breath sounds: Examination of the right-lower field reveals decreased breath sounds. Examination of the left-lower field reveals decreased breath sounds. Decreased breath sounds present. No wheezing or rhonchi.  Abdominal:     Palpations: Abdomen is soft.     Tenderness: There is no abdominal tenderness.  Musculoskeletal:     Right lower leg: No edema.     Left lower leg: No edema.  Skin:    General: Skin is warm.     Findings: No rash.  Neurological:     Mental Status: He is alert and oriented to person,  place, and time.       Data Reviewed: Basic Metabolic Panel: Recent Labs  Lab 05/01/20 1129 05/02/20 0516  NA 140 140  K 5.3* 5.7*  CL 106 108  CO2 26 26  GLUCOSE 96 135*  BUN 27* 30*  CREATININE 1.13 1.56*  CALCIUM 9.5 8.9   CBC: Recent Labs  Lab 05/01/20 1129 05/02/20 0516  WBC 12.7* 10.2  HGB 14.1 12.9*  HCT 45.0 40.0  MCV 93.6 91.3  PLT 173 141*   BNP (last 3 results) Recent Labs    12/29/19 1303  BNP 104.0*    CBG: Recent Labs  Lab 05/01/20 2225 05/02/20 1139  GLUCAP 199* 134*    Recent Results (from the past 240 hour(s))  SARS Coronavirus 2 by RT PCR (hospital order, [facility]) Nasopharyngeal Nasopharyngeal Swab     Status: None   Collection Time: 05/01/20  4:54 PM   Specimen: Nasopharyngeal Swab  Result Value Ref Range Status   SARS Coronavirus 2 NEGATIVE NEGATIVE Final    Comment: (NOTE) SARS-CoV-2 target nucleic acids are NOT DETECTED.  The SARS-CoV-2 RNA is generally detectable in upper and lower respiratory specimens during the acute phase of infection. The lowest concentration of SARS-CoV-2 viral copies this assay can detect is  250 copies / mL. A negative result does not preclude SARS-CoV-2 infection and should not be used as the sole basis for treatment or other patient management decisions.  A negative result may occur with improper specimen collection / handling, submission of specimen other than nasopharyngeal swab, presence of viral mutation(s) within the areas targeted by this assay, and inadequate number of viral copies (<250 copies / mL). A negative result must be combined with clinical observations, patient history, and epidemiological information.  Fact Sheet for Patients:   BoilerBrush.com.cy  Fact Sheet for Healthcare Providers: https://pope.com/  This test is not yet approved or  cleared by the [place] FDA and has been authorized for  detection and/or diagnosis of SARS-CoV-2 by FDA under an Emergency Use Authorization (EUA).  This EUA will remain in effect (meaning this test can be used) for the duration of the COVID-19 declaration under Section 564(b)(1) of the Act, 21 U.S.C. section 360bbb-3(b)(1), unless the authorization is terminated or revoked sooner.  Performed at [facility], [address] [place], [place] [address]      Studies: DG Chest 2 View  Result Date: 05/01/2020 CLINICAL DATA:  Hypoxia EXAM: CHEST - 2 VIEW COMPARISON:  12/29/2019 chest radiograph and CT chest. 01/31/2020 chest radiograph. FINDINGS: Hypoinflated lungs with chronic right hemidiaphragm elevation. Bibasilar opacities, likely atelectasis. Blunting of the right costophrenic sulcus likely reflects scarring versus trace pleural effusion. No pneumothorax. Partially obscured cardiomediastinal silhouette. Bilateral neck and medial right apex postsurgical sequela. Multilevel spondylosis. Left upper quadrant surgical clips. External radiopaque foreign body overlying the posterior body wall at the thoracolumbar junction. IMPRESSION: No acute airspace disease.  Bibasilar atelectasis. Chronic right hemidiaphragm elevation and right basilar scarring is unchanged. Electronically Signed   By: Stana Bunting M.D.   On: 05/01/2020 12:35   CT Angio Chest PE W and/or Wo Contrast  Result Date: 05/01/2020 CLINICAL DATA:  Shortness of breath EXAM: CT ANGIOGRAPHY CHEST WITH CONTRAST TECHNIQUE: Multidetector CT imaging of the chest was performed using the standard protocol during bolus administration of intravenous contrast. Multiplanar CT image reconstructions and MIPs were obtained to evaluate the vascular anatomy. CONTRAST:  74mL OMNIPAQUE IOHEXOL 350 MG/ML SOLN COMPARISON:  CT chest 12/29/2019 FINDINGS: Cardiovascular: Satisfactory opacification of the pulmonary arteries to the segmental level. No evidence of pulmonary embolism. Dilated main pulmonary  artery reflecting pulmonary arterial hypertension. Normal heart size. No pericardial effusion. Coronary artery and thoracic aortic calcification. Mediastinum/Nodes: No new enlarged lymph nodes. Stable appearance of thyroid. Lungs/Pleura: Persistent elevation of the right hemidiaphragm. Increased right basilar patchy density. Persistent patchy left lower lobe ground-glass density. No pleural effusion or pneumothorax. Upper Abdomen: No acute abnormality. Multiple hepatic cysts again seen. Surgical clips at the expected location of the left adrenal. Partially imaged chronic severe left hydronephrosis with cortical thinning. Musculoskeletal: No acute osseous abnormality. Review of the MIP images confirms the above findings. IMPRESSION: No evidence of acute pulmonary embolism. Persistent elevation of the right hemidiaphragm. Increased density at the lung bases favored to reflect atelectasis. Persistent patchy ground-glass density in the superior segment of the left lower lobe. Electronically Signed   By: Guadlupe Spanish M.D.   On: 05/01/2020 16:41    Scheduled Meds: . amLODipine  10 mg Oral Daily  . atorvastatin  20 mg Oral QHS  . azithromycin  250 mg Oral Daily  . budesonide (PULMICORT) nebulizer solution  0.5 mg Nebulization BID  . donepezil  5 mg Oral QHS  . enoxaparin (LOVENOX) injection  40 mg Subcutaneous Q24H  .  fluticasone  2 spray Each Nare Daily  . insulin aspart  0-5 Units Subcutaneous QHS  . insulin aspart  0-9 Units Subcutaneous TID WC  . ipratropium-albuterol  3 mL Nebulization Q6H  . methylPREDNISolone (SOLU-MEDROL) injection  40 mg Intravenous Daily  . metoprolol succinate  100 mg Oral Daily  . mirtazapine  15 mg Oral QHS  . multivitamin-lutein  1 capsule Oral Daily  . pantoprazole  40 mg Oral Daily  . sertraline  100 mg Oral QHS  . sodium zirconium cyclosilicate  10 g Oral BID   Continuous Infusions: . sodium chloride 50 mL/hr at 05/02/20 0821  . cefTRIAXone (ROCEPHIN)  IV Stopped  (05/01/20 1803)    Assessment/Plan:  1. Hyperkalemia with acute kidney injury on chronic kidney disease stage III.  Continue IV fluid hydration.  We will give Lokelma 10 mg twice a day.  Calcium gluconate, sodium bicarb, insulin and D50 ordered.  Recheck potassium today and again tomorrow morning.  Hold Cozaar. 2. Acute hypoxic respiratory failure.  Will check oxygenation on room air tomorrow morning to see if he qualifies for oxygen at home. 3. Pneumonia left lower lobe with COPD exacerbation.  Continue Solu-Medrol once a day continue nebulizer treatments continue Rocephin and Zithromax 4. Previous nonhemorrhagic stroke.  Patient is nonambulatory. 5. Essential hypertension on metoprolol, Norvasc.  Hold Cozaar with hyperkalemia. 6. Type 2 diabetes mellitus with hyperlipidemia on atorvastatin and diet controlled.  Hemoglobin A1c actually very good at 5.8. 7. Memory loss on Aricept 8. GERD on Prilosec 9. Depression on Zoloft  Code Status:     Code Status Orders  (From admission, onward)         Start     Ordered   05/01/20 1650  Full code  Continuous        05/01/20 1650        Code Status History    Date Active Date Inactive Code Status Order ID Comments User Context   12/29/2019 1519 01/03/2020 0204 Full Code [id number]  Lynn Ito, MD ED   11/17/2019 1659 11/20/2019 2019 Full Code [id number]  Lurene Shadow, MD ED   02/08/2019 0010 02/10/2019 1801 Full Code [id number]  Mayo, Allyn Kenner, MD Inpatient   10/19/2018 1055 10/21/2018 2322 Full Code [id number]  Milagros Loll, MD Inpatient   10/18/2018 2058 10/19/2018 1055 DNR [id number]  Milagros Loll, MD Inpatient   10/18/2018 1925 10/18/2018 2057 Full Code [id number]  Poggi, Excell Seltzer, MD Inpatient   10/17/2018 1423 10/18/2018 1826 Full Code [id number]  Poggi, Excell Seltzer, MD Inpatient   10/06/2018 1640 10/10/2018 1849 Full Code [id number]  Katha Hamming, MD ED   08/28/2018 1334 08/30/2018 1711 Full Code [id number]  Houston Siren, MD Inpatient   08/23/2018  0125 08/26/2018 2201 Full Code [id number]  Cammy Copa, MD Inpatient   02/15/2018 2012 02/21/2018 1700 Full Code [id number]  Maeola Harman, MD Inpatient   03/02/2017 1938 03/05/2017 1439 Full Code [id number]  Kennedy Bucker, MD Inpatient   Advance Care Planning Activity    Advance Directive Documentation     Most Recent Value  Type of Advance Directive Living will  Pre-existing out of facility DNR order (yellow form or pink MOST form) --  "MOST" Form in Place? --     Family Communication: Tried to reach the patient's wife on her cell phone without luck.  Unable to leave a message.  Left a message on the home phone. Disposition Plan: Status is: Inpatient   Dispo: The patient is from: Home  Anticipated d/c is to: Home              Anticipated d/c date is: Potentially 05/03/2020 versus 05/04/2020 depending on clinical status and potassium              Patient currently being treated for hyperkalemia and acute on chronic kidney disease.  Patient also being treated for acute respiratory failure with pneumonia and COPD exacerbation  Antibiotics:  Rocephin  Zithromax  Time spent: 27 minutes  [employer]

## 2020-05-03 LAB — BASIC METABOLIC PANEL
Anion gap: 10 (ref 5–15)
BUN: 41 mg/dL — ABNORMAL HIGH (ref 8–23)
CO2: 28 mmol/L (ref 22–32)
Calcium: 9.2 mg/dL (ref 8.9–10.3)
Chloride: 105 mmol/L (ref 98–111)
Creatinine, Ser: 1.51 mg/dL — ABNORMAL HIGH (ref 0.61–1.24)
GFR calc Af Amer: 48 mL/min — ABNORMAL LOW (ref >60)
GFR calc non Af Amer: 41 mL/min — ABNORMAL LOW (ref >60)
Glucose, Bld: 115 mg/dL — ABNORMAL HIGH (ref 70–99)
Potassium: 5.4 mmol/L — ABNORMAL HIGH (ref 3.5–5.1)
Sodium: 143 mmol/L (ref 135–145)

## 2020-05-03 LAB — GLUCOSE, CAPILLARY
Glucose-Capillary: 87 mg/dL (ref 70–99)
Glucose-Capillary: 98 mg/dL (ref 70–99)
Glucose-Capillary: 100 mg/dL — ABNORMAL HIGH (ref 70–99)
Glucose-Capillary: 198 mg/dL — ABNORMAL HIGH (ref 70–99)
Glucose-Capillary: 165 mg/dL — ABNORMAL HIGH (ref 70–99)

## 2020-05-03 LAB — MRSA PCR SCREENING: MRSA by PCR: NEGATIVE

## 2020-05-03 MED ORDER — SODIUM BICARBONATE 8.4 % IV SOLN
50.0000 meq | Freq: Once | INTRAVENOUS | Status: AC
  Administered 2020-05-03: 50 meq via INTRAVENOUS
  Filled 2020-05-03: qty 50

## 2020-05-03 MED ORDER — CALCIUM GLUCONATE-NACL 1-0.675 GM/50ML-% IV SOLN
1.0000 g | Freq: Once | INTRAVENOUS | Status: AC
  Administered 2020-05-03: 1000 mg via INTRAVENOUS
  Filled 2020-05-03: qty 50

## 2020-05-03 MED ORDER — IPRATROPIUM-ALBUTEROL 0.5-2.5 (3) MG/3ML IN SOLN
3.0000 mL | Freq: Three times a day (TID) | RESPIRATORY_TRACT | Status: DC
  Administered 2020-05-03 – 2020-05-04 (×2): 3 mL via RESPIRATORY_TRACT
  Filled 2020-05-03 (×3): qty 3

## 2020-05-03 MED ORDER — DEXTROSE 50 % IV SOLN
1.0000 | Freq: Once | INTRAVENOUS | Status: AC
  Administered 2020-05-03: 50 mL via INTRAVENOUS
  Filled 2020-05-03: qty 50

## 2020-05-03 MED ORDER — INSULIN ASPART 100 UNIT/ML IV SOLN
10.0000 [IU] | Freq: Once | INTRAVENOUS | Status: AC
  Administered 2020-05-03: 10 [IU] via INTRAVENOUS
  Filled 2020-05-03: qty 0.1

## 2020-05-03 NOTE — Care Management Important Message (Signed)
Important Message  Patient Details  Name: Gary Thomas MRN: [id number] Date of Birth: Jun 17, 1932   Medicare Important Message Given:  Yes     [name] A [name] 05/03/2020, 10:48 AM

## 2020-05-03 NOTE — Evaluation (Signed)
Occupational Therapy Evaluation Patient Details Name: HARON BEILKE MRN: [id number] DOB: [birth year] Today's Date: 05/03/2020    History of Present Illness 86 y.o. male coming in with shortness of breath going on for few days.  He states he took his pulse ox at home it was in the 70s.  They called his primary care physician's office and they told him to go to the emergency room.  The patient feels much better once oxygen was placed on him.  He states he has had a headache for 2 to 3 days but it was gone after the oxygen was placed on him.  C/o weezing and shortness of breath, imaging negative for PE, admitted with pneumonia and COPD exacerbation.   Clinical Impression   Mr Ciavarella was seen for OT evaluation this date. Prior to hospital admission, pt was MOD I for mobility using slide board t/fs and required assist from wife for bathing at bed level, pericare in depends, and LBD. Pt lives in handicap accessible home c hospital bed, ramp, w/c, and electric scooter. Pt presents to acute OT demonstrating impaired ADL performance and functional mobility 2/2 decreased activity tolerance and functional balance deficits. Pt currently requires MOD I c rails for rolling at bed level. MAX A for LBD at bed level. Deferred OOB mobility 2/2 elevated K+ (5.4) this date. Pt would benefit from skilled OT to address noted impairments and functional limitations (see below for any additional details) in order to maximize safety and independence while minimizing falls risk and caregiver burden. Upon hospital discharge, recommend HHOT to maximize pt safety and return to functional independence during meaningful occupations of daily life.     Follow Up Recommendations  Home health OT;Supervision - Intermittent    Equipment Recommendations       Recommendations for Other Services       Precautions / Restrictions Precautions Precautions: Fall Restrictions Weight Bearing Restrictions: No      Mobility Bed  Mobility Overal bed mobility: Modified Independent             General bed mobility comments: MOD I c rails for rolling at bed level   Transfers                 General transfer comment: Deferred 2/2 K+ 5.4, per PT pt is CGA for slide board t/f     Balance                                           ADL either performed or assessed with clinical judgement   ADL Overall ADL's : Needs assistance/impaired                                       General ADL Comments: MAX A for LBD at bed level. Anticipate MAX A pericare at bed level c L+R rolling     Vision         Perception     Praxis      Pertinent Vitals/Pain Pain Assessment: No/denies pain     Hand Dominance     Extremity/Trunk Assessment Upper Extremity Assessment Upper Extremity Assessment: Overall WFL for tasks assessed (BUE 5/5 grossly )   Lower Extremity Assessment Lower Extremity Assessment: Generalized weakness       Communication Communication Communication: No difficulties  Cognition Arousal/Alertness: Awake/alert Behavior During Therapy: WFL for tasks assessed/performed Overall Cognitive Status: Within Functional Limits for tasks assessed                                     General Comments  SpO2 91% on RA     Exercises Exercises: Other exercises Other Exercises Other Exercises: Pt educated re: OT role, DME recs, d/c recs, falls prevention, ECS Other Exercises: Rolling bed level, simulated UBD   Shoulder Instructions      Home Living Family/patient expects to be discharged to:: Private residence Living Arrangements: Spouse/significant other Available Help at Discharge: Family Type of Home: House Home Access: Ramped entrance     Home Layout: One level     Bathroom Shower/Tub: Producer, television/film/video: Standard Bathroom Accessibility: Yes   Home Equipment: Environmental consultant - 2 wheels;Cane - single point;Shower seat;Bedside  commode;Hospital bed;Wheelchair - IT trainer          Prior Functioning/Environment Level of Independence: Needs assistance  Gait / Transfers Assistance Needed: Pt reports that he uses a homemade slide board and can get to/from wc/bed w/o assist.   ADL's / Homemaking Assistance Needed: Wife assists with bathing, pericare (wears depends), and LBD            OT Problem List: Decreased activity tolerance;Impaired balance (sitting and/or standing)      OT Treatment/Interventions: Self-care/ADL training;Therapeutic exercise;Energy conservation;DME and/or AE instruction;Therapeutic activities;Patient/family education;Balance training    OT Goals(Current goals can be found in the care plan section) Acute Rehab OT Goals Patient Stated Goal: Go home, "no way" he would consider rehab OT Goal Formulation: With patient Time For Goal Achievement: 05/17/20 Potential to Achieve Goals: Good ADL Goals Pt Will Perform Grooming: with modified independence;sitting Pt Will Perform Upper Body Bathing: bed level;with set-up Pt Will Transfer to Toilet: with transfer board;with modified independence (to/from bed for pericare bed level)  OT Frequency: Min 1X/week   Barriers to D/C: Decreased caregiver support          Co-evaluation              AM-PAC OT "6 Clicks" Daily Activity     Outcome Measure Help from another person eating meals?: None Help from another person taking care of personal grooming?: None Help from another person toileting, which includes using toliet, bedpan, or urinal?: A Lot Help from another person bathing (including washing, rinsing, drying)?: A Lot Help from another person to put on and taking off regular upper body clothing?: None Help from another person to put on and taking off regular lower body clothing?: A Lot 6 Click Score: 18   End of Session    Activity Tolerance: Patient tolerated treatment well Patient left: in bed;with call bell/phone  within reach  OT Visit Diagnosis: Other abnormalities of gait and mobility (R26.89);Muscle weakness (generalized) (M62.81)                Time: 3785-8850 OT Time Calculation (min): 12 min Charges:  OT General Charges $OT Visit: 1 Visit OT Evaluation $OT Eval Low Complexity: 1 Low  Kathie Dike, M.S. OTR/L  05/03/20, 3:58 PM

## 2020-05-03 NOTE — Progress Notes (Signed)
Patient ID: Gary Thomas, male   DOB: [birth year], 86 y.o.   MRN: [id number] [facility] PROGRESS NOTE  Gary Thomas WUJ:[id number] DOB: [birth year] DOA: 05/01/2020 PCP: Marguarite ArbourSparks, [name], MD  HPI/Subjective: The patient is feeling better.  Breathing better.  Was able to come off the oxygen at rest.  He states that he used to be on CPAP with oxygen at night previously but this was taken away.  He states he sleeps better off the CPAP.  Patient came in with shortness of breath and found to have pneumonia.  Objective: Vitals:   05/03/20 0743 05/03/20 1007  BP: (!) 160/74 124/67  Pulse: 72 78  Resp: 18   Temp: 97.9 F (36.6 C)   SpO2: 97%     Intake/Output Summary (Last 24 hours) at 05/03/2020 1126 Last data filed at 05/03/2020 1007 Gross per 24 hour  Intake 472.91 ml  Output 620 ml  Net -147.09 ml   Filed Weights   05/01/20 1119  Weight: 108.9 kg    ROS: Review of Systems  Respiratory: Positive for cough. Negative for shortness of breath.   Cardiovascular: Negative for chest pain.  Gastrointestinal: Negative for abdominal pain.   Exam: Physical Exam HENT:     Head: Normocephalic.     Nose: No mucosal edema.     Mouth/Throat:     Pharynx: No oropharyngeal exudate.  Eyes:     General: Lids are normal.     Conjunctiva/sclera: Conjunctivae normal.     Pupils: Pupils are equal, round, and reactive to light.  Cardiovascular:     Rate and Rhythm: Normal rate and regular rhythm.     Heart sounds: Normal heart sounds, S1 normal and S2 normal.  Pulmonary:     Breath sounds: No decreased breath sounds, wheezing, rhonchi or rales.  Abdominal:     Palpations: Abdomen is soft.     Tenderness: There is no abdominal tenderness.  Musculoskeletal:     Right ankle: Swelling present.     Left ankle: Swelling present.  Skin:    General: Skin is warm.     Findings: No rash.  Neurological:     Mental Status: He is alert and oriented to person, place, and time.        Data Reviewed: Basic Metabolic Panel: Recent Labs  Lab 05/01/20 1129 05/02/20 0516 05/02/20 1345 05/03/20 0449  NA 140 140  --  143  K 5.3* 5.7* 5.3* 5.4*  CL 106 108  --  105  CO2 26 26  --  28  GLUCOSE 96 135*  --  115*  BUN 27* 30*  --  41*  CREATININE 1.13 1.56*  --  1.51*  CALCIUM 9.5 8.9  --  9.2   CBC: Recent Labs  Lab 05/01/20 1129 05/02/20 0516  WBC 12.7* 10.2  HGB 14.1 12.9*  HCT 45.0 40.0  MCV 93.6 91.3  PLT 173 141*   BNP (last 3 results) Recent Labs    12/29/19 1303  BNP 104.0*    CBG: Recent Labs  Lab 05/02/20 1139 05/02/20 1630 05/02/20 2059 05/03/20 0752 05/03/20 1006  GLUCAP 134* 172* 141* 87 98    Recent Results (from the past 240 hour(s))  SARS Coronavirus 2 by RT PCR (hospital order, [facility]) Nasopharyngeal Nasopharyngeal Swab     Status: None   Collection Time: 05/01/20  4:54 PM   Specimen: Nasopharyngeal Swab  Result Value Ref Range Status   SARS Coronavirus 2 NEGATIVE  NEGATIVE Final    Comment: (NOTE) SARS-CoV-2 target nucleic acids are NOT DETECTED.  The SARS-CoV-2 RNA is generally detectable in upper and lower respiratory specimens during the acute phase of infection. The lowest concentration of SARS-CoV-2 viral copies this assay can detect is 250 copies / mL. A negative result does not preclude SARS-CoV-2 infection and should not be used as the sole basis for treatment or other patient management decisions.  A negative result may occur with improper specimen collection / handling, submission of specimen other than nasopharyngeal swab, presence of viral mutation(s) within the areas targeted by this assay, and inadequate number of viral copies (<250 copies / mL). A negative result must be combined with clinical observations, patient history, and epidemiological information.  Fact Sheet for Patients:   BoilerBrush.com.cy  Fact Sheet for Healthcare  Providers: https://pope.com/  This test is not yet approved or  cleared by the [place] FDA and has been authorized for detection and/or diagnosis of SARS-CoV-2 by FDA under an Emergency Use Authorization (EUA).  This EUA will remain in effect (meaning this test can be used) for the duration of the COVID-19 declaration under Section 564(b)(1) of the Act, 21 U.S.C. section 360bbb-3(b)(1), unless the authorization is terminated or revoked sooner.  Performed at [facility], [address] [place], [place] [address]      Studies: DG Chest 2 View  Result Date: 05/01/2020 CLINICAL DATA:  Hypoxia EXAM: CHEST - 2 VIEW COMPARISON:  12/29/2019 chest radiograph and CT chest. 01/31/2020 chest radiograph. FINDINGS: Hypoinflated lungs with chronic right hemidiaphragm elevation. Bibasilar opacities, likely atelectasis. Blunting of the right costophrenic sulcus likely reflects scarring versus trace pleural effusion. No pneumothorax. Partially obscured cardiomediastinal silhouette. Bilateral neck and medial right apex postsurgical sequela. Multilevel spondylosis. Left upper quadrant surgical clips. External radiopaque foreign body overlying the posterior body wall at the thoracolumbar junction. IMPRESSION: No acute airspace disease.  Bibasilar atelectasis. Chronic right hemidiaphragm elevation and right basilar scarring is unchanged. Electronically Signed   By: Stana Bunting M.D.   On: 05/01/2020 12:35   CT Angio Chest PE W and/or Wo Contrast  Result Date: 05/01/2020 CLINICAL DATA:  Shortness of breath EXAM: CT ANGIOGRAPHY CHEST WITH CONTRAST TECHNIQUE: Multidetector CT imaging of the chest was performed using the standard protocol during bolus administration of intravenous contrast. Multiplanar CT image reconstructions and MIPs were obtained to evaluate the vascular anatomy. CONTRAST:  70mL OMNIPAQUE IOHEXOL 350 MG/ML SOLN COMPARISON:  CT chest 12/29/2019  FINDINGS: Cardiovascular: Satisfactory opacification of the pulmonary arteries to the segmental level. No evidence of pulmonary embolism. Dilated main pulmonary artery reflecting pulmonary arterial hypertension. Normal heart size. No pericardial effusion. Coronary artery and thoracic aortic calcification. Mediastinum/Nodes: No new enlarged lymph nodes. Stable appearance of thyroid. Lungs/Pleura: Persistent elevation of the right hemidiaphragm. Increased right basilar patchy density. Persistent patchy left lower lobe ground-glass density. No pleural effusion or pneumothorax. Upper Abdomen: No acute abnormality. Multiple hepatic cysts again seen. Surgical clips at the expected location of the left adrenal. Partially imaged chronic severe left hydronephrosis with cortical thinning. Musculoskeletal: No acute osseous abnormality. Review of the MIP images confirms the above findings. IMPRESSION: No evidence of acute pulmonary embolism. Persistent elevation of the right hemidiaphragm. Increased density at the lung bases favored to reflect atelectasis. Persistent patchy ground-glass density in the superior segment of the left lower lobe. Electronically Signed   By: Guadlupe Spanish M.D.   On: 05/01/2020 16:41    Scheduled Meds: . amLODipine  10 mg Oral Daily  .  atorvastatin  20 mg Oral QHS  . azithromycin  250 mg Oral Daily  . budesonide (PULMICORT) nebulizer solution  0.5 mg Nebulization BID  . donepezil  5 mg Oral QHS  . enoxaparin (LOVENOX) injection  40 mg Subcutaneous Q24H  . fluticasone  2 spray Each Nare Daily  . insulin aspart  0-5 Units Subcutaneous QHS  . insulin aspart  0-9 Units Subcutaneous TID WC  . ipratropium-albuterol  3 mL Nebulization Q6H  . methylPREDNISolone (SOLU-MEDROL) injection  40 mg Intravenous Daily  . metoprolol succinate  100 mg Oral Daily  . mirtazapine  15 mg Oral QHS  . multivitamin-lutein  1 capsule Oral Daily  . pantoprazole  40 mg Oral Daily  . sertraline  100 mg Oral QHS   . sodium zirconium cyclosilicate  10 g Oral BID   Continuous Infusions: . sodium chloride 50 mL/hr at 05/02/20 0821  . calcium gluconate 1,000 mg (05/03/20 1028)  . cefTRIAXone (ROCEPHIN)  IV 2 g (05/02/20 1702)    Assessment/Plan:  1. Hyperkalemia with acute kidney injury on chronic kidney disease stage III.  Continue IV fluid hydration.  Continue Lokelma.  I also ordered calcium, sodium bicarb, insulin and D50.  Holding Cozaar.  2. Acute hypoxic respiratory failure.  Patient was able to come off oxygen at rest.  Since the patient does have a history of being on CPAP with oxygen I will get an overnight oximetry to see if he qualifies for oxygen at night.  3. Pneumonia left lower lobe with COPD exacerbation.  Change Solu-Medrol over to prednisone for tomorrow.  Continue Rocephin and Zithromax. 4. Previous nonhemorrhagic stroke.  Patient is nonambulatory  5. Essential hypertension on metoprolol and Norvasc.  Continue to hold Cozaar with hyperkalemia. 6. Type 2 diabetes mellitus with hyperlipidemia on atorvastatin.  Diet control for diabetes.  Hemoglobin A1c on the lower side with 5.8. 7. Memory loss on Aricept.  8. GERD on Prilosec.   9. Depression on Zoloft.    Code Status:     Code Status Orders  (From admission, onward)         Start     Ordered   05/01/20 1650  Full code  Continuous        05/01/20 1650        Code Status History    Date Active Date Inactive Code Status Order ID Comments User Context   12/29/2019 1519 01/03/2020 0204 Full Code [id number]  Lynn Ito, MD ED   11/17/2019 1659 11/20/2019 2019 Full Code [id number]  Lurene Shadow, MD ED   02/08/2019 0010 02/10/2019 1801 Full Code [id number]  Mayo, Allyn Kenner, MD Inpatient   10/19/2018 1055 10/21/2018 2322 Full Code [id number]  Milagros Loll, MD Inpatient   10/18/2018 2058 10/19/2018 1055 DNR [id number]  Milagros Loll, MD Inpatient   10/18/2018 1925 10/18/2018 2057 Full Code [id number]  Poggi, Excell Seltzer, MD Inpatient   10/17/2018 1423  10/18/2018 1826 Full Code [id number]  Poggi, Excell Seltzer, MD Inpatient   10/06/2018 1640 10/10/2018 1849 Full Code [id number]  Katha Hamming, MD ED   08/28/2018 1334 08/30/2018 1711 Full Code [id number]  Houston Siren, MD Inpatient   08/23/2018 0125 08/26/2018 2201 Full Code [id number]  Cammy Copa, MD Inpatient   02/15/2018 2012 02/21/2018 1700 Full Code [id number]  Maeola Harman, MD Inpatient   03/02/2017 1938 03/05/2017 1439 Full Code [id number]  Kennedy Bucker, MD Inpatient   Advance Care Planning Activity    Advance Directive Documentation     Most Recent  Value  Type of Advance Directive Living will  Pre-existing out of facility DNR order (yellow form or pink MOST form) --  "MOST" Form in Place? --     Family Communication: Spoke with the patient's wife at the bedside Disposition Plan: Status is: Inpatient   Dispo: The patient is from: Home              Anticipated d/c is to: Home with home health              Anticipated d/c date is: Potentially 05/04/2020 depending on potassium              Patient currently being treated for hyperkalemia and acute on chronic kidney disease.  Antibiotics:  Rocephin  Zithromax  Time spent: 28 minutes  Amiah Frohlich [employer]

## 2020-05-03 NOTE — TOC Progression Note (Signed)
Transition of Care [facility]) - Progression Note    Patient Details  Name: Gary Thomas MRN: [id number] Date of Birth: 28-Apr-1932  Transition of Care [facility]) CM/SW [place], RN Phone Number: 05/03/2020, 9:57 AM  Clinical Narrative:   Met with the patient to discuss DC plan and needs  He lives at home with his wife, He has a hospital bed, a electric scooter, RW, BSC, shower seat, cane, He needs a sliding board long, I let Zack with [employer] know, He would like to have [facility] with [facility], I notified Corene Cornea, He is up to date with his PCP and cam afford his medication       Expected Discharge Plan: [place] Barriers to Discharge: Continued Medical Work up  Expected Discharge Plan and Services Expected Discharge Plan: [place] arrangements for the past 2 months: Single Family Home                 DME Arranged: Other see comment (sliding board) DME Agency: [employer] Date DME Agency Contacted: 05/03/20 Time DME Agency Contacted: [phone] Representative spoke with at DME Agency: [place]: PT Trainer: [place] ([place]) Date [place]: 05/03/20 Time [place]: [phone] Representative spoke with at [place]: [place] ([place]) Interventions    Readmission Risk Interventions Readmission Risk Prevention Plan 02/10/2019  Transportation Screening Complete  Medication Review Press photographer) Complete  PCP or Specialist appointment within 3-5 days of discharge Complete  HRI or [place] Complete  SW Recovery Care/Counseling Consult Complete  [place] Patient Refused  Some recent data might be hidden

## 2020-05-04 DIAGNOSIS — G4734 Idiopathic sleep related nonobstructive alveolar hypoventilation: Secondary | ICD-10-CM

## 2020-05-04 LAB — BASIC METABOLIC PANEL
Anion gap: 7 (ref 5–15)
BUN: 42 mg/dL — ABNORMAL HIGH (ref 8–23)
CO2: 27 mmol/L (ref 22–32)
Calcium: 8.4 mg/dL — ABNORMAL LOW (ref 8.9–10.3)
Chloride: 109 mmol/L (ref 98–111)
Creatinine, Ser: 1.48 mg/dL — ABNORMAL HIGH (ref 0.61–1.24)
GFR calc Af Amer: 49 mL/min — ABNORMAL LOW (ref >60)
GFR calc non Af Amer: 42 mL/min — ABNORMAL LOW (ref >60)
Glucose, Bld: 144 mg/dL — ABNORMAL HIGH (ref 70–99)
Potassium: 4.4 mmol/L (ref 3.5–5.1)
Sodium: 143 mmol/L (ref 135–145)

## 2020-05-04 LAB — GLUCOSE, CAPILLARY: Glucose-Capillary: 102 mg/dL — ABNORMAL HIGH (ref 70–99)

## 2020-05-04 MED ORDER — AZITHROMYCIN 250 MG PO TABS: ORAL_TABLET | ORAL | 0 refills | Status: DC

## 2020-05-04 MED ORDER — FLUTICASONE PROPIONATE 50 MCG/ACT NA SUSP: 2.0000 | Freq: Every day | NASAL | 2 refills | Status: AC

## 2020-05-04 MED ORDER — GUAIFENESIN-DM 100-10 MG/5ML PO SYRP: 5.0000 mL | ORAL_SOLUTION | ORAL | 0 refills | Status: DC | PRN

## 2020-05-04 MED ORDER — CEFDINIR 300 MG PO CAPS
300.0000 mg | ORAL_CAPSULE | Freq: Two times a day (BID) | ORAL | 0 refills | Status: AC
Start: 2020-05-04 — End: 2020-05-07

## 2020-05-04 NOTE — Discharge Summary (Signed)
[facility] [place] at [facility]   PATIENT NAME: Gary Thomas    MR#:  [id number]  DATE OF BIRTH:  [birth year]  DATE OF ADMISSION:  05/01/2020 ADMITTING PHYSICIAN: Alford Highland, MD  DATE OF DISCHARGE: 05/04/2020  3:55 PM  PRIMARY CARE PHYSICIAN: Marguarite Arbour, MD    ADMISSION DIAGNOSIS:  Pneumonia [J18.9] SOB (shortness of breath) [R06.02] Hypoxia [R09.02] COPD exacerbation (HCC) [J44.1]  DISCHARGE DIAGNOSIS:  Active Problems:   Acute respiratory failure with hypoxia (HCC)   Type 2 diabetes mellitus with hyperlipidemia (HCC)   Lobar pneumonia (HCC)   COPD exacerbation (HCC)   Acute kidney injury superimposed on CKD (HCC)   SECONDARY DIAGNOSIS:   Past Medical History:  Diagnosis Date  . Depression   . Diabetes mellitus without complication (HCC)   . Dyspnea   . Elevated lipids   . Femur fracture (HCC) 08/22/2018  . GERD (gastroesophageal reflux disease)   . Hypertension   . Pulmonary emboli (HCC)   . Renal insufficiency    only has 1 kidney  . Sleep apnea   . Stroke (HCC)    Rt side weakness    HOSPITAL COURSE:   1.  Acute hypoxic respiratory failure.  The patient was hypoxic on presentation to the emergency room.  The patient was able to come off oxygen at rest.  The patient did say that he has a history of sleep apnea being on CPAP with oxygen in the past.  I did get an overnight oximetry and he does desaturate quite a bit overnight.  He qualifies for nocturnal oxygen.  Transitional care team to set up. 2.  Pneumonia left lower lobe with COPD exacerbation.  The patient was given Solu-Medrol during the hospital course once a day.  Patient was given Rocephin and Zithromax.  Will have 1 more dose of Zithromax tomorrow orally upon discharge.  We will switch Rocephin over to [facility] for 5 more doses.  Since patient did not sleep last night we will get him home and stop steroids since lungs are clear. 3.  Hyperkalemia with acute kidney injury  on chronic kidney disease stage III.  The patient was given IV fluid during the entire hospital course.  I had to give Lokelma twice a day.  I had to order short acting medications including calcium gluconate, sodium bicarb insulin and D50.  I had to hold Cozaar.  Creatinine had improved down to 1.48 upon discharge.  Potassium better at 4.4.  Recommend checking a BMP as outpatient.  The patient's creatinine peaked at 1.56 during hospital course. 4.  Previous nonhemorrhagic stroke.  Patient is nonambulatory 5.  Essential hypertension on metoprolol and Norvasc.  Continue to hold Cozaar with hyperkalemia during the hospital course. 6.  Type 2 diabetes mellitus with hyperlipidemia on atorvastatin.  Patient is on diet control for diabetes.  Hemoglobin A1c actually on the lower side at 5.8. 7.  Memory loss on Aricept 8.  GERD on Prilosec 9.  Depression on Zoloft  DISCHARGE CONDITIONS:   Satisfactory  CONSULTS OBTAINED:  None  DRUG ALLERGIES:   Allergies  Allergen Reactions  . Butorphanol Anaphylaxis    Unknown Other reaction(s): Unknown Unknown  . Codeine Anaphylaxis  . Morphine Anaphylaxis  . Gabapentin Other (See Comments)    Unsure of what the reaction was   . Lyrica [Pregabalin] Rash  . Tapentadol Nausea Only    ONSET 01/04/2013  . Tramadol     Hallucinations "I see giant spiders on ceiling". Pt also  believes he is in [facility], but he is in [place].    DISCHARGE MEDICATIONS:   Allergies as of 05/04/2020      Reactions   Butorphanol Anaphylaxis   Unknown Other reaction(s): Unknown Unknown   Codeine Anaphylaxis   Morphine Anaphylaxis   Gabapentin Other (See Comments)   Unsure of what the reaction was   Lyrica [pregabalin] Rash   Tapentadol Nausea Only   ONSET 01/04/2013   Tramadol    Hallucinations "I see giant spiders on ceiling". Pt also believes he is in [facility], but he is in [place].      Medication List    STOP taking these medications   losartan 25 MG tablet Commonly  known as: COZAAR   megestrol 40 MG/ML suspension Commonly known as: MEGACE     TAKE these medications   acetaminophen 325 MG tablet Commonly known as: TYLENOL Take 2 tablets (650 mg total) by mouth every 6 (six) hours as needed for mild pain (or Fever >/= 101).   albuterol (2.5 MG/3ML) 0.083% nebulizer solution Commonly known as: PROVENTIL Take 3 mLs (2.5 mg total) by nebulization every 4 (four) hours as needed for wheezing or shortness of breath. Notes to patient: Not given this hospital visit   amLODipine 10 MG tablet Commonly known as: NORVASC Take 1 tablet (10 mg total) by mouth daily.   atorvastatin 20 MG tablet Commonly known as: LIPITOR Take 1 tablet (20 mg total) by mouth at bedtime.   azithromycin 250 MG tablet Commonly known as: ZITHROMAX One tab po daily for one day Start taking on: May 05, 2020   cefdinir 300 MG capsule Commonly known as: OMNICEF Take 1 capsule (300 mg total) by mouth 2 (two) times daily for 5 doses.   donepezil 5 MG tablet Commonly known as: ARICEPT Take 5 mg by mouth at bedtime.   fluticasone 50 MCG/ACT nasal spray Commonly known as: FLONASE Place 2 sprays into both nostrils daily.   guaiFENesin-dextromethorphan 100-10 MG/5ML syrup Commonly known as: ROBITUSSIN DM Take 5 mLs by mouth every 4 (four) hours as needed for cough.   metoprolol succinate 100 MG 24 hr tablet Commonly known as: TOPROL-XL Take 100 mg by mouth daily.   mirtazapine 15 MG tablet Commonly known as: REMERON Take 15 mg by mouth at bedtime.   multivitamin-lutein Caps capsule Take 1 capsule by mouth daily.   nystatin powder Commonly known as: MYCOSTATIN/NYSTOP Apply topically as directed. Apply with each ostomy change as instructed   omeprazole 40 MG capsule Commonly known as: PRILOSEC Take 1 capsule (40 mg total) by mouth daily. Notes to patient: Not given this hospital stay   sertraline 100 MG tablet Commonly known as: ZOLOFT Take 1 tablet (100 mg  total) by mouth at bedtime.   tiotropium 18 MCG inhalation capsule Commonly known as: SPIRIVA Place 18 mcg into inhaler and inhale daily. Notes to patient: Not given this hospital stay   Vitamin D (Ergocalciferol) 1.25 MG (50000 UNIT) Caps capsule Commonly known as: DRISDOL Take 50,000 Units by mouth once a week.            Durable Medical Equipment  (From admission, onward)         Start     Ordered   05/04/20 0757  For home use only DME oxygen  Once       Question Answer Comment  Length of Need Lifetime   Mode or (Route) Nasal cannula   Liters per Minute 2   Frequency Only  at night (stationary unit needed)   Oxygen conserving device Yes   Oxygen delivery system Gas      05/04/20 0756   05/03/20 0938  For home use only DME Other see comment  Once       Comments: Long slide board more than 250 lbs  Question:  Length of Need  Answer:  Lifetime   05/03/20 0938           DISCHARGE INSTRUCTIONS:   Follow-up PMD 5 days  If you experience worsening of your admission symptoms, develop shortness of breath, life threatening emergency, suicidal or homicidal thoughts you must seek medical attention immediately by calling 911 or calling your MD immediately  if symptoms less severe.  You Must read complete instructions/literature along with all the possible adverse reactions/side effects for all the Medicines you take and that have been prescribed to you. Take any new Medicines after you have completely understood and accept all the possible adverse reactions/side effects.   Please note  You were cared for by a hospitalist during your hospital stay. If you have any questions about your discharge medications or the care you received while you were in the hospital after you are discharged, you can call the unit and asked to speak with the hospitalist on call if the hospitalist that took care of you is not available. Once you are discharged, your primary care physician will handle  any further medical issues. Please note that NO REFILLS for any discharge medications will be authorized once you are discharged, as it is imperative that you return to your primary care physician (or establish a relationship with a primary care physician if you do not have one) for your aftercare needs so that they can reassess your need for medications and monitor your lab values.    Today   CHIEF COMPLAINT:   Chief Complaint  Patient presents with  . Shortness of Breath    HISTORY OF PRESENT ILLNESS:  Leroy KennedyDanford Rundell  is a 86 y.o. male came in with shortness of breath.   VITAL SIGNS:  Blood pressure (!) 154/71, pulse 69, temperature 98.1 F (36.7 C), temperature source Oral, resp. rate 17, height 5\' 9"  (1.753 m), weight 108.9 kg, SpO2 94 %.  I/O:    Intake/Output Summary (Last 24 hours) at 05/04/2020 1622 Last data filed at 05/04/2020 1027 Gross per 24 hour  Intake 120 ml  Output 520 ml  Net -400 ml    PHYSICAL EXAMINATION:  GENERAL:  86 y.o.-year-old patient lying in the bed with no acute distress.  EYES: Pupils equal, round, reactive to light and accommodation. No scleral icterus.  HEENT: Head atraumatic, normocephalic. Oropharynx and nasopharynx clear.  LUNGS: Normal breath sounds bilaterally, no wheezing, rales,rhonchi or crepitation. No use of accessory muscles of respiration.  CARDIOVASCULAR: S1, S2 normal. No murmurs, rubs, or gallops.  ABDOMEN: Soft, non-tender, non-distended. Bowel sounds present. No organomegaly or mass.  EXTREMITIES: No pedal edema, cyanosis, or clubbing.  NEUROLOGIC: Cranial nerves II through XII are intact.  PSYCHIATRIC: The patient is alert and oriented x 3.  SKIN: No obvious rash, lesion, or ulcer.   DATA REVIEW:   CBC Recent Labs  Lab 05/02/20 0516  WBC 10.2  HGB 12.9*  HCT 40.0  PLT 141*    Chemistries  Recent Labs  Lab 05/04/20 0402  NA 143  K 4.4  CL 109  CO2 27  GLUCOSE 144*  BUN 42*  CREATININE 1.48*  CALCIUM  8.4*  Microbiology Results  Results for orders placed or performed during the hospital encounter of 05/01/20  SARS Coronavirus 2 by RT PCR (hospital order, performed in [facility] hospital lab) Nasopharyngeal Nasopharyngeal Swab     Status: None   Collection Time: 05/01/20  4:54 PM   Specimen: Nasopharyngeal Swab  Result Value Ref Range Status   SARS Coronavirus 2 NEGATIVE NEGATIVE Final    Comment: (NOTE) SARS-CoV-2 target nucleic acids are NOT DETECTED.  The SARS-CoV-2 RNA is generally detectable in upper and lower respiratory specimens during the acute phase of infection. The lowest concentration of SARS-CoV-2 viral copies this assay can detect is 250 copies / mL. A negative result does not preclude SARS-CoV-2 infection and should not be used as the sole basis for treatment or other patient management decisions.  A negative result may occur with improper specimen collection / handling, submission of specimen other than nasopharyngeal swab, presence of viral mutation(s) within the areas targeted by this assay, and inadequate number of viral copies (<250 copies / mL). A negative result must be combined with clinical observations, patient history, and epidemiological information.  Fact Sheet for Patients:   BoilerBrush.com.cy  Fact Sheet for Healthcare Providers: https://pope.com/  This test is not yet approved or  cleared by the [place] FDA and has been authorized for detection and/or diagnosis of SARS-CoV-2 by FDA under an Emergency Use Authorization (EUA).  This EUA will remain in effect (meaning this test can be used) for the duration of the COVID-19 declaration under Section 564(b)(1) of the Act, 21 U.S.C. section 360bbb-3(b)(1), unless the authorization is terminated or revoked sooner.  Performed at [facility], [address] [place], [place] [address]   MRSA PCR Screening     Status: None    Collection Time: 05/03/20  7:00 PM   Specimen: Nasopharyngeal  Result Value Ref Range Status   MRSA by PCR NEGATIVE NEGATIVE Final    Comment:        The GeneXpert MRSA Assay (FDA approved for NASAL specimens only), is one component of a comprehensive MRSA colonization surveillance program. It is not intended to diagnose MRSA infection nor to guide or monitor treatment for MRSA infections. Performed at [facility], [address] [place], [place] [address]      Management plans discussed with the patient, family and they are in agreement.  CODE STATUS:     Code Status Orders  (From admission, onward)         Start     Ordered   05/01/20 1650  Full code  Continuous        05/01/20 1650        Code Status History    Date Active Date Inactive Code Status Order ID Comments User Context   12/29/2019 1519 01/03/2020 0204 Full Code [id number]  Lynn Ito, MD ED   11/17/2019 1659 11/20/2019 2019 Full Code [id number]  Lurene Shadow, MD ED   02/08/2019 0010 02/10/2019 1801 Full Code [id number]  Mayo, Allyn Kenner, MD Inpatient   10/19/2018 1055 10/21/2018 2322 Full Code [id number]  Milagros Loll, MD Inpatient   10/18/2018 2058 10/19/2018 1055 DNR [id number]  Milagros Loll, MD Inpatient   10/18/2018 1925 10/18/2018 2057 Full Code [id number]  Poggi, Excell Seltzer, MD Inpatient   10/17/2018 1423 10/18/2018 1826 Full Code [id number]  Christena Flake, MD Inpatient   10/06/2018 1640 10/10/2018 1849 Full Code [id number]  Katha Hamming, MD ED   08/28/2018 1334 08/30/2018 1711 Full Code [id number]  Sainani,  Rolly Pancake, MD Inpatient   08/23/2018 0125 08/26/2018 2201 Full Code [id number]  Cammy Copa, MD Inpatient   02/15/2018 2012 02/21/2018 1700 Full Code [id number]  Maeola Harman, MD Inpatient   03/02/2017 1938 03/05/2017 1439 Full Code [id number]  Kennedy Bucker, MD Inpatient   Advance Care Planning Activity    Advance Directive Documentation     Most Recent Value  Type of Advance Directive Living will   Pre-existing out of facility DNR order (yellow form or pink MOST form) --  "MOST" Form in Place? --      TOTAL TIME TAKING CARE OF THIS PATIENT: 34 minutes.    Alford Highland M.D on 05/04/2020 at 4:22 PM  Between 7am to 6pm - Pager - [phone]  After 6pm go to [email or web address] - password [email or web address]  [facility]  CC: Primary care physician; Marguarite Arbour, MD

## 2020-05-04 NOTE — TOC Transition Note (Signed)
Transition of Care [facility]) - CM/SW Discharge Note   Patient Details  Name: Gary Thomas MRN: [id number] Date of Birth: [birth year]  [facility] CM/SW Contact:  Maud Deed, LCSW Phone Number: 05/04/2020, 10:42 AM   Clinical Narrative:    Pt medically stable to for discharge per MD. Pt will be transported home by his wife. CSW was made aware the pt needs home O2. and orders are done. CSW contcated [place] with [employer] for oxygen delivery.Oxygen will be delivered to pt's room before discharge.    Final next level of care: Home w Home Health Services Barriers to Discharge: No Barriers Identified   Patient Goals and CMS Choice Patient states their goals for this hospitalization and ongoing recovery are:: go home      Discharge Placement                  Name of family member notified: [name] Patient and family notified of of transfer: 05/04/20  Discharge Plan and Services                DME Arranged: Oxygen DME Agency: [facility] Date DME Agency Contacted: 05/04/20 Time DME Agency Contacted: 1031 Representative spoke with at DME Agency: Ledell Noss HH Arranged: RN HH Agency: [facility] Date HH Agency Contacted: 05/03/20 Time HH Agency Contacted: [phone] Representative spoke with at [facility] Agency: Barbara Cower  Social Determinants of Health (SDOH) Interventions     Readmission Risk Interventions Readmission Risk Prevention Plan 02/10/2019  Transportation Screening Complete  Medication Review Oceanographer) Complete  PCP or Specialist appointment within 3-5 days of discharge Complete  HRI or Home Care Consult Complete  SW Recovery Care/Counseling Consult Complete  Palliative Care Screening Not Applicable  Skilled Nursing Facility Patient Refused  Some recent data might be hidden

## 2020-05-06 LAB — GLUCOSE, CAPILLARY: Glucose-Capillary: 80 mg/dL (ref 70–99)

## 2020-05-10 ENCOUNTER — Ambulatory Visit
Admission: RE | Admit: 2020-05-10 | Discharge: 2020-05-10 | Disposition: A | Discharge status: Home / Self Care | Payer: Medicare Other | Source: Ambulatory Visit | Attending: Surgery | Admitting: Surgery

## 2020-05-10 ENCOUNTER — Other Ambulatory Visit: Payer: Self-pay | Admitting: Surgery

## 2020-05-10 ENCOUNTER — Other Ambulatory Visit: Payer: Self-pay

## 2020-05-10 ENCOUNTER — Other Ambulatory Visit
Admission: RE | Admit: 2020-05-10 | Discharge: 2020-05-10 | Disposition: A | Discharge status: Home / Self Care | Payer: Medicare Other | Source: Ambulatory Visit | Attending: Sports Medicine | Admitting: Sports Medicine

## 2020-05-10 DIAGNOSIS — S72454S Nondisplaced supracondylar fracture without intracondylar extension of lower end of right femur, sequela: Secondary | ICD-10-CM

## 2020-05-10 LAB — SYNOVIAL CELL COUNT + DIFF, W/ CRYSTALS
Crystals, Fluid: NONE SEEN
Eosinophils-Synovial: 0 %
Lymphocytes-Synovial Fld: 4 %
Monocyte-Macrophage-Synovial Fluid: 12 %
Neutrophil, Synovial: 84 %
WBC, Synovial: UNDETERMINED /mm3 (ref 0–200)

## 2020-05-13 ENCOUNTER — Other Ambulatory Visit: Payer: Self-pay | Admitting: Surgery

## 2020-05-13 ENCOUNTER — Other Ambulatory Visit: Payer: Self-pay

## 2020-05-13 ENCOUNTER — Other Ambulatory Visit
Admission: RE | Admit: 2020-05-13 | Discharge: 2020-05-13 | Disposition: A | Discharge status: Home / Self Care | Payer: Medicare Other | Source: Ambulatory Visit | Attending: Surgery | Admitting: Surgery

## 2020-05-13 DIAGNOSIS — Z20822 Contact with and (suspected) exposure to covid-19: Secondary | ICD-10-CM | POA: Insufficient documentation

## 2020-05-13 DIAGNOSIS — Z01812 Encounter for preprocedural laboratory examination: Secondary | ICD-10-CM | POA: Insufficient documentation

## 2020-05-13 LAB — SARS CORONAVIRUS 2 (TAT 6-24 HRS): SARS Coronavirus 2: NEGATIVE

## 2020-05-13 MED ORDER — CHLORHEXIDINE GLUCONATE 0.12 % MT SOLN: 15.0000 mL | Freq: Once | OROMUCOSAL | Status: AC

## 2020-05-13 MED ORDER — SODIUM CHLORIDE 0.9 % IV SOLN: INTRAVENOUS | Status: DC

## 2020-05-13 MED ORDER — ORAL CARE MOUTH RINSE: 15.0000 mL | Freq: Once | OROMUCOSAL | Status: AC

## 2020-05-14 ENCOUNTER — Other Ambulatory Visit: Payer: Self-pay

## 2020-05-14 ENCOUNTER — Ambulatory Visit: Payer: Medicare Other | Admitting: Anesthesiology

## 2020-05-14 ENCOUNTER — Inpatient Hospital Stay
Admission: RE | Admit: 2020-05-14 | Discharge: 2020-05-17 | DRG: 983 | Disposition: A | Discharge status: Home / Self Care | Payer: Medicare Other | Attending: Surgery | Admitting: Surgery

## 2020-05-14 ENCOUNTER — Encounter: Payer: Self-pay | Admitting: Surgery

## 2020-05-14 ENCOUNTER — Encounter
Admission: RE | Disposition: A | Discharge status: Home / Self Care | Payer: Self-pay | Source: Home / Self Care | Attending: Surgery

## 2020-05-14 DIAGNOSIS — Z87891 Personal history of nicotine dependence: Secondary | ICD-10-CM

## 2020-05-14 DIAGNOSIS — Z8673 Personal history of transient ischemic attack (TIA), and cerebral infarction without residual deficits: Secondary | ICD-10-CM

## 2020-05-14 DIAGNOSIS — Z888 Allergy status to other drugs, medicaments and biological substances status: Secondary | ICD-10-CM | POA: Diagnosis not present

## 2020-05-14 DIAGNOSIS — Z86711 Personal history of pulmonary embolism: Secondary | ICD-10-CM

## 2020-05-14 DIAGNOSIS — Z8546 Personal history of malignant neoplasm of prostate: Secondary | ICD-10-CM | POA: Diagnosis not present

## 2020-05-14 DIAGNOSIS — Z79899 Other long term (current) drug therapy: Secondary | ICD-10-CM

## 2020-05-14 DIAGNOSIS — E1151 Type 2 diabetes mellitus with diabetic peripheral angiopathy without gangrene: Secondary | ICD-10-CM | POA: Diagnosis present

## 2020-05-14 DIAGNOSIS — Z9841 Cataract extraction status, right eye: Secondary | ICD-10-CM

## 2020-05-14 DIAGNOSIS — B957 Other staphylococcus as the cause of diseases classified elsewhere: Secondary | ICD-10-CM | POA: Diagnosis present

## 2020-05-14 DIAGNOSIS — Z7951 Long term (current) use of inhaled steroids: Secondary | ICD-10-CM | POA: Diagnosis not present

## 2020-05-14 DIAGNOSIS — Z9049 Acquired absence of other specified parts of digestive tract: Secondary | ICD-10-CM

## 2020-05-14 DIAGNOSIS — Z8249 Family history of ischemic heart disease and other diseases of the circulatory system: Secondary | ICD-10-CM

## 2020-05-14 DIAGNOSIS — L02415 Cutaneous abscess of right lower limb: Secondary | ICD-10-CM | POA: Diagnosis present

## 2020-05-14 DIAGNOSIS — E875 Hyperkalemia: Secondary | ICD-10-CM | POA: Diagnosis present

## 2020-05-14 DIAGNOSIS — Z981 Arthrodesis status: Secondary | ICD-10-CM | POA: Diagnosis not present

## 2020-05-14 DIAGNOSIS — I700 Atherosclerosis of aorta: Secondary | ICD-10-CM | POA: Diagnosis present

## 2020-05-14 DIAGNOSIS — Z8701 Personal history of pneumonia (recurrent): Secondary | ICD-10-CM

## 2020-05-14 DIAGNOSIS — Z20822 Contact with and (suspected) exposure to covid-19: Secondary | ICD-10-CM | POA: Diagnosis present

## 2020-05-14 DIAGNOSIS — Z823 Family history of stroke: Secondary | ICD-10-CM | POA: Diagnosis not present

## 2020-05-14 DIAGNOSIS — N1831 Chronic kidney disease, stage 3a: Secondary | ICD-10-CM | POA: Diagnosis present

## 2020-05-14 DIAGNOSIS — G4730 Sleep apnea, unspecified: Secondary | ICD-10-CM | POA: Diagnosis present

## 2020-05-14 DIAGNOSIS — Z96641 Presence of right artificial hip joint: Secondary | ICD-10-CM | POA: Diagnosis present

## 2020-05-14 DIAGNOSIS — F329 Major depressive disorder, single episode, unspecified: Secondary | ICD-10-CM | POA: Diagnosis present

## 2020-05-14 DIAGNOSIS — F419 Anxiety disorder, unspecified: Secondary | ICD-10-CM | POA: Diagnosis present

## 2020-05-14 DIAGNOSIS — R197 Diarrhea, unspecified: Secondary | ICD-10-CM | POA: Diagnosis not present

## 2020-05-14 DIAGNOSIS — Z794 Long term (current) use of insulin: Secondary | ICD-10-CM

## 2020-05-14 DIAGNOSIS — Z885 Allergy status to narcotic agent status: Secondary | ICD-10-CM

## 2020-05-14 DIAGNOSIS — I739 Peripheral vascular disease, unspecified: Secondary | ICD-10-CM | POA: Diagnosis present

## 2020-05-14 DIAGNOSIS — Z96651 Presence of right artificial knee joint: Secondary | ICD-10-CM | POA: Diagnosis present

## 2020-05-14 DIAGNOSIS — B9562 Methicillin resistant Staphylococcus aureus infection as the cause of diseases classified elsewhere: Secondary | ICD-10-CM | POA: Diagnosis not present

## 2020-05-14 DIAGNOSIS — I129 Hypertensive chronic kidney disease with stage 1 through stage 4 chronic kidney disease, or unspecified chronic kidney disease: Secondary | ICD-10-CM | POA: Diagnosis present

## 2020-05-14 DIAGNOSIS — J449 Chronic obstructive pulmonary disease, unspecified: Secondary | ICD-10-CM | POA: Diagnosis present

## 2020-05-14 DIAGNOSIS — M21371 Foot drop, right foot: Secondary | ICD-10-CM | POA: Diagnosis present

## 2020-05-14 DIAGNOSIS — E1122 Type 2 diabetes mellitus with diabetic chronic kidney disease: Secondary | ICD-10-CM | POA: Diagnosis present

## 2020-05-14 DIAGNOSIS — Z9842 Cataract extraction status, left eye: Secondary | ICD-10-CM

## 2020-05-14 DIAGNOSIS — E785 Hyperlipidemia, unspecified: Secondary | ICD-10-CM | POA: Diagnosis present

## 2020-05-14 DIAGNOSIS — K219 Gastro-esophageal reflux disease without esophagitis: Secondary | ICD-10-CM | POA: Diagnosis present

## 2020-05-14 DIAGNOSIS — L0889 Other specified local infections of the skin and subcutaneous tissue: Secondary | ICD-10-CM | POA: Diagnosis not present

## 2020-05-14 HISTORY — PX: I & D EXTREMITY: SHX5045

## 2020-05-14 LAB — GLUCOSE, CAPILLARY
Glucose-Capillary: 115 mg/dL — ABNORMAL HIGH (ref 70–99)
Glucose-Capillary: 141 mg/dL — ABNORMAL HIGH (ref 70–99)

## 2020-05-14 SURGERY — IRRIGATION AND DEBRIDEMENT EXTREMITY
Anesthesia: General | Site: Thigh | Laterality: Right

## 2020-05-14 MED ORDER — FENTANYL CITRATE (PF) 100 MCG/2ML IJ SOLN
INTRAMUSCULAR | Status: AC
  Filled 2020-05-14: qty 2

## 2020-05-14 MED ORDER — MAGNESIUM HYDROXIDE 400 MG/5ML PO SUSP: 30.0000 mL | Freq: Every day | ORAL | Status: DC | PRN

## 2020-05-14 MED ORDER — AMLODIPINE BESYLATE 5 MG PO TABS
5.0000 mg | ORAL_TABLET | Freq: Every day | ORAL | Status: DC
  Administered 2020-05-15 – 2020-05-17 (×3): 5 mg via ORAL
  Filled 2020-05-14 (×3): qty 1

## 2020-05-14 MED ORDER — ONDANSETRON HCL 4 MG/2ML IJ SOLN: 4.0000 mg | Freq: Four times a day (QID) | INTRAMUSCULAR | Status: DC | PRN

## 2020-05-14 MED ORDER — NYSTATIN 100000 UNIT/GM EX POWD
1.0000 "application " | Freq: Every day | CUTANEOUS | Status: DC | PRN
  Filled 2020-05-14: qty 15

## 2020-05-14 MED ORDER — IPRATROPIUM-ALBUTEROL 0.5-2.5 (3) MG/3ML IN SOLN
3.0000 mL | Freq: Once | RESPIRATORY_TRACT | Status: AC
  Administered 2020-05-14: 3 mL via RESPIRATORY_TRACT

## 2020-05-14 MED ORDER — IPRATROPIUM-ALBUTEROL 0.5-2.5 (3) MG/3ML IN SOLN
RESPIRATORY_TRACT | Status: AC
  Filled 2020-05-14: qty 3

## 2020-05-14 MED ORDER — SUCCINYLCHOLINE CHLORIDE 20 MG/ML IJ SOLN
INTRAMUSCULAR | Status: DC | PRN
  Administered 2020-05-14: 100 mg via INTRAVENOUS

## 2020-05-14 MED ORDER — CEFAZOLIN SODIUM-DEXTROSE 2-4 GM/100ML-% IV SOLN
INTRAVENOUS | Status: AC
  Filled 2020-05-14: qty 100

## 2020-05-14 MED ORDER — KETOROLAC TROMETHAMINE 15 MG/ML IJ SOLN
INTRAMUSCULAR | Status: AC
  Administered 2020-05-14: 15 mg via INTRAVENOUS
  Filled 2020-05-14: qty 1

## 2020-05-14 MED ORDER — DONEPEZIL HCL 5 MG PO TABS
5.0000 mg | ORAL_TABLET | Freq: Every day | ORAL | Status: DC
  Administered 2020-05-14 – 2020-05-16 (×3): 5 mg via ORAL
  Filled 2020-05-14 (×3): qty 1

## 2020-05-14 MED ORDER — KETOROLAC TROMETHAMINE 15 MG/ML IJ SOLN: 15.0000 mg | Freq: Once | INTRAMUSCULAR | Status: AC

## 2020-05-14 MED ORDER — ONDANSETRON HCL 4 MG PO TABS: 4.0000 mg | ORAL_TABLET | Freq: Four times a day (QID) | ORAL | Status: DC | PRN

## 2020-05-14 MED ORDER — FENTANYL CITRATE (PF) 100 MCG/2ML IJ SOLN
INTRAMUSCULAR | Status: DC | PRN
  Administered 2020-05-14 (×2): 50 ug via INTRAVENOUS

## 2020-05-14 MED ORDER — MIRTAZAPINE 15 MG PO TABS
15.0000 mg | ORAL_TABLET | Freq: Every day | ORAL | Status: DC
  Administered 2020-05-14 – 2020-05-16 (×3): 15 mg via ORAL
  Filled 2020-05-14 (×3): qty 1

## 2020-05-14 MED ORDER — FLEET ENEMA 7-19 GM/118ML RE ENEM: 1.0000 | ENEMA | Freq: Once | RECTAL | Status: DC | PRN

## 2020-05-14 MED ORDER — OCUVITE-LUTEIN PO CAPS
1.0000 | ORAL_CAPSULE | Freq: Every day | ORAL | Status: DC
  Administered 2020-05-15 – 2020-05-17 (×3): 1 via ORAL
  Filled 2020-05-14 (×3): qty 1

## 2020-05-14 MED ORDER — DEXAMETHASONE SODIUM PHOSPHATE 10 MG/ML IJ SOLN
INTRAMUSCULAR | Status: DC | PRN
  Administered 2020-05-14: 5 mg via INTRAVENOUS

## 2020-05-14 MED ORDER — FLUTICASONE PROPIONATE 50 MCG/ACT NA SUSP
2.0000 | Freq: Every day | NASAL | Status: DC
  Administered 2020-05-15 – 2020-05-17 (×3): 2 via NASAL
  Filled 2020-05-14 (×2): qty 16

## 2020-05-14 MED ORDER — ACETAMINOPHEN 10 MG/ML IV SOLN
INTRAVENOUS | Status: DC | PRN
  Administered 2020-05-14: 1000 mg via INTRAVENOUS

## 2020-05-14 MED ORDER — METOCLOPRAMIDE HCL 10 MG PO TABS: 5.0000 mg | ORAL_TABLET | Freq: Three times a day (TID) | ORAL | Status: DC | PRN

## 2020-05-14 MED ORDER — VANCOMYCIN HCL 2000 MG/400ML IV SOLN
2000.0000 mg | Freq: Once | INTRAVENOUS | Status: AC
  Administered 2020-05-14: 2000 mg via INTRAVENOUS
  Filled 2020-05-14: qty 400

## 2020-05-14 MED ORDER — PHENYLEPHRINE HCL (PRESSORS) 10 MG/ML IV SOLN
INTRAVENOUS | Status: DC | PRN
  Administered 2020-05-14: 100 ug via INTRAVENOUS
  Administered 2020-05-14 (×2): 200 ug via INTRAVENOUS

## 2020-05-14 MED ORDER — ONDANSETRON HCL 4 MG/2ML IJ SOLN: 4.0000 mg | Freq: Once | INTRAMUSCULAR | Status: DC | PRN

## 2020-05-14 MED ORDER — ACETAMINOPHEN 500 MG PO TABS
1000.0000 mg | ORAL_TABLET | Freq: Four times a day (QID) | ORAL | Status: AC
  Administered 2020-05-14: 1000 mg via ORAL
  Filled 2020-05-14 (×3): qty 2

## 2020-05-14 MED ORDER — KETOROLAC TROMETHAMINE 15 MG/ML IJ SOLN
7.5000 mg | Freq: Four times a day (QID) | INTRAMUSCULAR | Status: AC
  Administered 2020-05-14 – 2020-05-15 (×4): 7.5 mg via INTRAVENOUS
  Filled 2020-05-14 (×3): qty 1

## 2020-05-14 MED ORDER — SODIUM CHLORIDE 0.9 % IV SOLN: INTRAVENOUS | Status: DC

## 2020-05-14 MED ORDER — VANCOMYCIN HCL IN DEXTROSE 1-5 GM/200ML-% IV SOLN
1000.0000 mg | Freq: Two times a day (BID) | INTRAVENOUS | Status: DC
  Filled 2020-05-14 (×2): qty 200

## 2020-05-14 MED ORDER — ACETAMINOPHEN 10 MG/ML IV SOLN
INTRAVENOUS | Status: AC
  Filled 2020-05-14: qty 100

## 2020-05-14 MED ORDER — GUAIFENESIN-DM 100-10 MG/5ML PO SYRP: 5.0000 mL | ORAL_SOLUTION | ORAL | Status: DC | PRN

## 2020-05-14 MED ORDER — VANCOMYCIN HCL IN DEXTROSE 1-5 GM/200ML-% IV SOLN: 1000.0000 mg | Freq: Once | INTRAVENOUS | Status: DC

## 2020-05-14 MED ORDER — FUROSEMIDE 40 MG PO TABS
40.0000 mg | ORAL_TABLET | ORAL | Status: DC
  Administered 2020-05-15 – 2020-05-17 (×3): 40 mg via ORAL
  Filled 2020-05-14 (×3): qty 1

## 2020-05-14 MED ORDER — PROPOFOL 10 MG/ML IV BOLUS
INTRAVENOUS | Status: DC | PRN
  Administered 2020-05-14: 40 mg via INTRAVENOUS
  Administered 2020-05-14: 120 mg via INTRAVENOUS
  Administered 2020-05-14 (×2): 40 mg via INTRAVENOUS

## 2020-05-14 MED ORDER — FENTANYL CITRATE (PF) 100 MCG/2ML IJ SOLN
25.0000 ug | INTRAMUSCULAR | Status: DC | PRN
  Administered 2020-05-14 (×3): 25 ug via INTRAVENOUS

## 2020-05-14 MED ORDER — VANCOMYCIN HCL 1000 MG IV SOLR
INTRAVENOUS | Status: AC
  Filled 2020-05-14: qty 1000

## 2020-05-14 MED ORDER — DOCUSATE SODIUM 100 MG PO CAPS
100.0000 mg | ORAL_CAPSULE | Freq: Two times a day (BID) | ORAL | Status: DC
  Administered 2020-05-14 – 2020-05-17 (×6): 100 mg via ORAL
  Filled 2020-05-14 (×6): qty 1

## 2020-05-14 MED ORDER — ZINC OXIDE 40 % EX OINT
1.0000 "application " | TOPICAL_OINTMENT | Freq: Every day | CUTANEOUS | Status: DC | PRN
  Filled 2020-05-14: qty 113

## 2020-05-14 MED ORDER — VANCOMYCIN HCL 1250 MG/250ML IV SOLN
1250.0000 mg | INTRAVENOUS | Status: DC
  Administered 2020-05-15 – 2020-05-16 (×2): 1250 mg via INTRAVENOUS
  Filled 2020-05-14 (×3): qty 250

## 2020-05-14 MED ORDER — ATORVASTATIN CALCIUM 20 MG PO TABS
20.0000 mg | ORAL_TABLET | Freq: Every day | ORAL | Status: DC
  Administered 2020-05-14 – 2020-05-16 (×3): 20 mg via ORAL
  Filled 2020-05-14 (×4): qty 1

## 2020-05-14 MED ORDER — FENTANYL CITRATE (PF) 100 MCG/2ML IJ SOLN
INTRAMUSCULAR | Status: AC
  Administered 2020-05-14: 25 ug via INTRAVENOUS
  Filled 2020-05-14: qty 2

## 2020-05-14 MED ORDER — CHLORHEXIDINE GLUCONATE 0.12 % MT SOLN
OROMUCOSAL | Status: AC
  Administered 2020-05-14: 15 mL via OROMUCOSAL
  Filled 2020-05-14: qty 15

## 2020-05-14 MED ORDER — VANCOMYCIN HCL 1000 MG IV SOLR
INTRAVENOUS | Status: DC | PRN
  Administered 2020-05-14: 1000 mg

## 2020-05-14 MED ORDER — OXYCODONE-ACETAMINOPHEN 5-325 MG PO TABS
1.0000 | ORAL_TABLET | Freq: Once | ORAL | Status: AC
  Administered 2020-05-14: 1 via ORAL

## 2020-05-14 MED ORDER — OXYCODONE-ACETAMINOPHEN 5-325 MG PO TABS
ORAL_TABLET | ORAL | Status: AC
  Filled 2020-05-14: qty 1

## 2020-05-14 MED ORDER — METOCLOPRAMIDE HCL 5 MG/ML IJ SOLN: 5.0000 mg | Freq: Three times a day (TID) | INTRAMUSCULAR | Status: DC | PRN

## 2020-05-14 MED ORDER — BISACODYL 10 MG RE SUPP: 10.0000 mg | Freq: Every day | RECTAL | Status: DC | PRN

## 2020-05-14 MED ORDER — ACETAMINOPHEN 325 MG PO TABS
325.0000 mg | ORAL_TABLET | Freq: Four times a day (QID) | ORAL | Status: DC | PRN
  Administered 2020-05-15 – 2020-05-17 (×2): 650 mg via ORAL
  Filled 2020-05-14: qty 2

## 2020-05-14 MED ORDER — TIOTROPIUM BROMIDE MONOHYDRATE 18 MCG IN CAPS
18.0000 ug | ORAL_CAPSULE | Freq: Every morning | RESPIRATORY_TRACT | Status: DC
  Administered 2020-05-15 – 2020-05-17 (×3): 18 ug via RESPIRATORY_TRACT
  Filled 2020-05-14 (×2): qty 5

## 2020-05-14 MED ORDER — VITAMIN D (ERGOCALCIFEROL) 1.25 MG (50000 UNIT) PO CAPS: 50000.0000 [IU] | ORAL_CAPSULE | ORAL | Status: DC

## 2020-05-14 MED ORDER — EPHEDRINE SULFATE 50 MG/ML IJ SOLN
INTRAMUSCULAR | Status: DC | PRN
  Administered 2020-05-14: 10 mg via INTRAVENOUS
  Administered 2020-05-14 (×2): 5 mg via INTRAVENOUS

## 2020-05-14 MED ORDER — SERTRALINE HCL 100 MG PO TABS
100.0000 mg | ORAL_TABLET | Freq: Every day | ORAL | Status: DC
  Administered 2020-05-14 – 2020-05-16 (×3): 100 mg via ORAL
  Filled 2020-05-14 (×3): qty 1
  Filled 2020-05-14 (×2): qty 2
  Filled 2020-05-14: qty 1
  Filled 2020-05-14: qty 2

## 2020-05-14 MED ORDER — CEFAZOLIN SODIUM-DEXTROSE 2-4 GM/100ML-% IV SOLN
2.0000 g | INTRAVENOUS | Status: AC
  Administered 2020-05-14: 2 g via INTRAVENOUS

## 2020-05-14 MED ORDER — DIPHENHYDRAMINE HCL 12.5 MG/5ML PO ELIX: 12.5000 mg | ORAL_SOLUTION | ORAL | Status: DC | PRN

## 2020-05-14 MED ORDER — ONDANSETRON HCL 4 MG/2ML IJ SOLN
INTRAMUSCULAR | Status: DC | PRN
  Administered 2020-05-14: 4 mg via INTRAVENOUS

## 2020-05-14 MED ORDER — ENOXAPARIN SODIUM 40 MG/0.4ML ~~LOC~~ SOLN
40.0000 mg | SUBCUTANEOUS | Status: DC
  Administered 2020-05-15 – 2020-05-17 (×3): 40 mg via SUBCUTANEOUS
  Filled 2020-05-14 (×3): qty 0.4

## 2020-05-14 MED ORDER — METOPROLOL SUCCINATE ER 50 MG PO TB24
100.0000 mg | ORAL_TABLET | Freq: Every day | ORAL | Status: DC
  Administered 2020-05-15 – 2020-05-17 (×3): 100 mg via ORAL
  Filled 2020-05-14 (×3): qty 2

## 2020-05-14 MED ORDER — LIDOCAINE HCL (CARDIAC) PF 100 MG/5ML IV SOSY
PREFILLED_SYRINGE | INTRAVENOUS | Status: DC | PRN
  Administered 2020-05-14: 100 mg via INTRAVENOUS

## 2020-05-14 SURGICAL SUPPLY — 49 items
BLADE SURG SZ10 CARB STEEL (BLADE) ×3 IMPLANT
BNDG COHESIVE 4X5 TAN STRL (GAUZE/BANDAGES/DRESSINGS) ×3 IMPLANT
BNDG ELASTIC 6X5.8 VLCR STR LF (GAUZE/BANDAGES/DRESSINGS) ×3 IMPLANT
BNDG ESMARK 4X12 TAN STRL LF (GAUZE/BANDAGES/DRESSINGS) ×3 IMPLANT
CANISTER SUCT 1200ML W/VALVE (MISCELLANEOUS) ×3 IMPLANT
CHLORAPREP W/TINT 26 (MISCELLANEOUS) ×3 IMPLANT
COVER WAND RF STERILE (DRAPES) ×3 IMPLANT
CUFF TOURN SGL QUICK 18X4 (TOURNIQUET CUFF) IMPLANT
CUFF TOURN SGL QUICK 24 (TOURNIQUET CUFF)
CUFF TOURN SGL QUICK 30 (TOURNIQUET CUFF) ×2
CUFF TRNQT CYL 24X4X16.5-23 (TOURNIQUET CUFF) IMPLANT
CUFF TRNQT CYL 30X4X21-28X (TOURNIQUET CUFF) ×1 IMPLANT
ELECT REM PT RETURN 9FT ADLT (ELECTROSURGICAL) ×3
ELECTRODE REM PT RTRN 9FT ADLT (ELECTROSURGICAL) ×1 IMPLANT
GAUZE SPONGE 4X4 12PLY STRL (GAUZE/BANDAGES/DRESSINGS) ×3 IMPLANT
GAUZE XEROFORM 1X8 LF (GAUZE/BANDAGES/DRESSINGS) ×3 IMPLANT
GLOVE BIO SURGEON STRL SZ8 (GLOVE) ×3 IMPLANT
GLOVE INDICATOR 8.0 STRL GRN (GLOVE) ×3 IMPLANT
GLOVE SURG ORTHO 8.5 STRL (GLOVE) ×3 IMPLANT
GOWN STRL REUS W/ TWL LRG LVL3 (GOWN DISPOSABLE) ×1 IMPLANT
GOWN STRL REUS W/ TWL XL LVL3 (GOWN DISPOSABLE) ×1 IMPLANT
GOWN STRL REUS W/TWL LRG LVL3 (GOWN DISPOSABLE) ×2
GOWN STRL REUS W/TWL XL LVL3 (GOWN DISPOSABLE) ×2
HANDLE YANKAUER SUCT BULB TIP (MISCELLANEOUS) ×3 IMPLANT
HEMOVAC 400CC 10FR (MISCELLANEOUS) ×3 IMPLANT
KIT STIMULAN RAPID CURE 5CC (Orthopedic Implant) ×3 IMPLANT
KIT TURNOVER KIT A (KITS) ×3 IMPLANT
LABEL OR SOLS (LABEL) ×3 IMPLANT
NDL SAFETY ECLIPSE 18X1.5 (NEEDLE) ×1 IMPLANT
NEEDLE HYPO 18GX1.5 SHARP (NEEDLE) ×2
NS IRRIG 1000ML POUR BTL (IV SOLUTION) ×3 IMPLANT
PACK EXTREMITY (MISCELLANEOUS) ×3 IMPLANT
PAD ABD DERMACEA PRESS 5X9 (GAUZE/BANDAGES/DRESSINGS) ×6 IMPLANT
PAD CAST CTTN 4X4 STRL (SOFTGOODS) ×1 IMPLANT
PADDING CAST COTTON 4X4 STRL (SOFTGOODS) ×2
SPLINT CAST 1 STEP 3X12 (MISCELLANEOUS) ×3 IMPLANT
SPONGE LAP 18X18 RF (DISPOSABLE) ×3 IMPLANT
STAPLER SKIN PROX 35W (STAPLE) ×3 IMPLANT
STOCKINETTE BIAS CUT 4 980044 (GAUZE/BANDAGES/DRESSINGS) ×3 IMPLANT
STOCKINETTE IMPERVIOUS 9X36 MD (GAUZE/BANDAGES/DRESSINGS) ×3 IMPLANT
SUT ETHILON 4-0 (SUTURE) ×2
SUT ETHILON 4-0 FS2 18XMFL BLK (SUTURE) ×1
SUT PROLENE 1 CT (SUTURE) ×3 IMPLANT
SUT VIC AB 2-0 CT1 36 (SUTURE) ×3 IMPLANT
SUT VIC AB 4-0 SH 27 (SUTURE) ×2
SUT VIC AB 4-0 SH 27XANBCTRL (SUTURE) ×1 IMPLANT
SUT VICRYL+ 3-0 36IN CT-1 (SUTURE) ×3 IMPLANT
SUTURE ETHLN 4-0 FS2 18XMF BLK (SUTURE) ×1 IMPLANT
SYR 10ML LL (SYRINGE) ×3 IMPLANT

## 2020-05-14 NOTE — Anesthesia Procedure Notes (Signed)
Procedure Name: Intubation Date/Time: 05/14/2020 4:17 PM Performed by: Rosanne Gutting, CRNA Pre-anesthesia Checklist: Patient identified, Patient being monitored, Timeout performed, Emergency Drugs available and Suction available Patient Re-evaluated:Patient Re-evaluated prior to induction Oxygen Delivery Method: Circle system utilized Preoxygenation: Pre-oxygenation with 100% oxygen Induction Type: IV induction Ventilation: Mask ventilation without difficulty Laryngoscope Size: McGraph and 4 Grade View: Grade I Tube type: Oral Tube size: 7.5 mm Number of attempts: 1 Airway Equipment and Method: Stylet Placement Confirmation: ETT inserted through vocal cords under direct vision,  positive ETCO2 and breath sounds checked- equal and bilateral Secured at: 21 cm Tube secured with: Tape Dental Injury: Teeth and Oropharynx as per pre-operative assessment  Comments: LMA continued to leak and unable to deliver adequate ventilation. LMA changed for ETT

## 2020-05-14 NOTE — Op Note (Signed)
05/14/2020  5:03 PM  Patient:   Gary Thomas  Pre-Op Diagnosis:   Deep right thigh abscess.  Post-Op Diagnosis:   Same  Procedure:   Irrigation and debridement of deep right thigh abscess with excision of 0.8 x 5 cm bone shard  Surgeon:   Maryagnes Amos, MD  Assistant:   Volanda Napoleon, PA-S  Anesthesia:   General LMA --> GET  Findings:   As above.  Complications:   None  Fluids:   700 cc crystalloid  EBL:   100 cc  UOP:   None  TT:   None  Drains:   Hemovac x1  Closure:   #1 Prolene interrupted sutures  Brief Clinical Note:   The patient is an 86 year old male who is now 20 months status post an open reduction and internal fixation of a right supracondylar femur fracture proximal to a total knee arthroplasty.  The patient developed a postoperative wound infection, necessitating formal irrigation and debridement with hardware removal in January, 2020.  The patient went on to do well and showed no evidence for recurrent infection as of July, 2020, so the patient was discharged.  The patient did well for 1 year but then presented to the office 4 days ago complaining of a 1 to 2-week history of right thigh pain and swelling with erythema developing along the incision.  Subsequent work-up including a CT of the thigh and an aspiration under ultrasound guidance demonstrated the presence of a deep thigh abscess around what appears to be a piece of dead bone in the soft tissues lateral to the femur.  The aspiration grew out MRSA.  The patient presents at this time for formal irrigation and debridement with removal of the retained shard of bone.  Procedure:   The patient was brought into the operating room and lain in the supine position.  After adequate general laryngeal mask anesthesia was obtained, the patient's right lower extremity was prepped with ChloraPrep solution before being draped sterilely.  Preoperative antibiotics were administered.  About this time, the  anesthesiologist felt that the LMA was not providing an adequate seal, so the patient was converted to endotracheal intubation and anesthesia.  Utilizing the previous incision, an approximately 7 to 8 cm incision was made along the lateral aspect of the thigh centered over the area of erythema.  The incision was carried down through the subcutaneous tissues to expose the IT band.  This is split the length of the incision and purulent material was immediately encountered.  Cultures were obtained.  The shard of bone was readily identified and removed.  The walls of the abscess itself were scraped with a large curette before the wound was copiously irrigated with 3 L of sterile saline solution using the pulse lavage system.  A single batch of vancomycin laden dissolvable beads was mixed on the back table and inserted into the abscess around the bone.  A single limb to a Hemovac drain was placed before the wound was closed using #1 Prolene interrupted sutures.  A large sterile bulky dressing was applied to the wound before the patient was awakened, extubated, and returned to the recovery room in satisfactory condition after tolerating the procedure well.

## 2020-05-14 NOTE — Anesthesia Preprocedure Evaluation (Signed)
Anesthesia Evaluation  Patient identified by MRN, date of birth, ID band Patient awake    Reviewed: Allergy & Precautions, NPO status , Patient's Chart, lab work & pertinent test results  History of Anesthesia Complications Negative for: history of anesthetic complications  Airway Mallampati: III       Dental  (+) Loose, Missing, Chipped   Pulmonary shortness of breath, with exertion and Long-Term Oxygen Therapy, sleep apnea , neg COPD, neg recent URI, former smoker,     + decreased breath sounds      Cardiovascular hypertension, Pt. on medications (-) angina+ Peripheral Vascular Disease  (-) Past MI and (-) CHF Normal cardiovascular exam(-) dysrhythmias (-) Valvular Problems/Murmurs     Neuro/Psych neg Seizures PSYCHIATRIC DISORDERS Depression CVA (memory difficulties)    GI/Hepatic GERD  Medicated,  Endo/Other  diabetes, Type 2, Insulin Dependent  Renal/GU Renal InsufficiencyRenal disease     Musculoskeletal   Abdominal   Peds  Hematology   Anesthesia Other Findings Past Medical History: No date: Depression No date: Diabetes mellitus without complication (HCC) No date: Dyspnea No date: Elevated lipids 08/22/2018: Femur fracture (HCC) No date: GERD (gastroesophageal reflux disease) No date: Hypertension No date: Pulmonary emboli (HCC) No date: Renal insufficiency     Comment:  only has 1 kidney No date: Sleep apnea No date: Stroke (HCC)     Comment:  Rt side weakness   Reproductive/Obstetrics                             Anesthesia Physical  Anesthesia Plan  ASA: III  Anesthesia Plan: General   Post-op Pain Management:    Induction: Intravenous  PONV Risk Score and Plan: 2 and Dexamethasone and Ondansetron  Airway Management Planned: LMA  Additional Equipment:   Intra-op Plan:   Post-operative Plan: Extubation in OR  Informed Consent: I have reviewed the patients  History and Physical, chart, labs and discussed the procedure including the risks, benefits and alternatives for the proposed anesthesia with the patient or authorized representative who has indicated his/her understanding and acceptance.       Plan Discussed with:   Anesthesia Plan Comments:         Anesthesia Quick Evaluation

## 2020-05-14 NOTE — Transfer of Care (Signed)
Immediate Anesthesia Transfer of Care Note  Patient: Gary Thomas  Procedure(s) Performed: IRRIGATION AND DEBRIDEMENT OF RIGHT THIGH ABCESS (Right Thigh)  Patient Location: PACU  Anesthesia Type:General  Level of Consciousness: drowsy  Airway & Oxygen Therapy: Patient Spontanous Breathing and Patient connected to face mask oxygen  Post-op Assessment: Report given to RN and Post -op Vital signs reviewed and stable  Post vital signs: Reviewed and stable  Last Vitals:  Vitals Value Taken Time  BP 193/73 05/14/20 1722  Temp 36.1 C 05/14/20 1722  Pulse 95 05/14/20 1725  Resp 17 05/14/20 1725  SpO2 96 % 05/14/20 1725  Vitals shown include unvalidated device data.  Last Pain:  Vitals:   05/14/20 1321  TempSrc: Oral  PainSc: 5          Complications: No complications documented.

## 2020-05-14 NOTE — Anesthesia Procedure Notes (Signed)
Procedure Name: LMA Insertion Date/Time: 05/14/2020 3:55 PM Performed by: Rosanne Gutting, CRNA Pre-anesthesia Checklist: Patient identified, Patient being monitored, Timeout performed, Emergency Drugs available and Suction available Patient Re-evaluated:Patient Re-evaluated prior to induction Oxygen Delivery Method: Circle system utilized Preoxygenation: Pre-oxygenation with 100% oxygen Induction Type: IV induction Ventilation: Mask ventilation without difficulty LMA: LMA inserted LMA Size: 4.5 Tube type: Oral Number of attempts: 2 Placement Confirmation: positive ETCO2 and breath sounds checked- equal and bilateral Tube secured with: Tape Dental Injury: Teeth and Oropharynx as per pre-operative assessment  Comments: 4.0 LMA attempted. Would not seat. Changed to 4.5 LMA

## 2020-05-14 NOTE — Consult Note (Addendum)
Pharmacy Antibiotic Note  Gary Thomas is a 86 y.o. male admitted on 05/14/2020 with right thigh abscess.  Pharmacy has been consulted for vancomycin dosing. Recent aspiration of wound cultured MRSA. Patient is status post irrigation and debridement on 8/3.   Per op note: "A single batch of vancomycin laden dissolvable beads was mixed on the back table and inserted into the abscess around the bone". Confirmed with surgeon no IV vancomycin administered peri-operatively.   Plan:  Vancomycin 2 g IV loading dose x 1   Followed by vancomycin 1.25 g IV q24h per nomogram (based on patient information using last Scr of 1.2)  Last Scr was 1.2 on 05/09/20. Will follow-up Scr tomorrow AM per protocol.    Temp (24hrs), Avg:97.5 F (36.4 C), Min:97 F (36.1 C), Max:98.1 F (36.7 C)  No results for input(s): WBC, CREATININE, LATICACIDVEN, VANCOTROUGH, VANCOPEAK, VANCORANDOM, GENTTROUGH, GENTPEAK, GENTRANDOM, TOBRATROUGH, TOBRAPEAK, TOBRARND, AMIKACINPEAK, AMIKACINTROU, AMIKACIN in the last 168 hours.  Estimated Creatinine Clearance: 43.6 mL/min (A) (by C-G formula based on SCr of 1.48 mg/dL (H)).    Allergies  Allergen Reactions  . Butorphanol Anaphylaxis  . Codeine Anaphylaxis  . Morphine Anaphylaxis  . Gabapentin Other (See Comments)    Unsure of what the reaction was   . Lyrica [Pregabalin] Rash  . Tapentadol Nausea Only    ONSET 01/04/2013  . Tramadol     Hallucinations "I see giant spiders on ceiling". Pt also believes he is in [facility], but he is in [place].    Antimicrobials this admission: Cefazolin 2 g in OR on 8/3  Vancomycin 8/3 >>   Dose adjustments this admission: n/a  Microbiology results: 8/3 Wound Cx: pending  Thank you for allowing pharmacy to be a part of this patient's care.  Tressie Ellis 05/14/2020 9:35 PM

## 2020-05-14 NOTE — H&P (Signed)
History of Present Illness:  Gary Thomas is a 86 y.o. male who presents for follow-up now 1.5 years status post a irrigation and debridement with hardware removal of his postoperative wound infection following an open reduction and internal fixation of a right supracondylar femur fracture just above a total knee arthroplasty. Over the past few weeks, he has began to notice some increased discomfort, swelling, and erythema over the lateral aspect of his right thigh. The patient has been admitted several times over the past 6 months for pneumonia, the most recent of which was on 05/01/2020. He was discharged on 05/06/2020, but continues to note worsening of his right thigh pain, swelling, and erythema, prompting him to request to be seen today. According to his wife, he has not had any fevers over the past several days. He did have a fall yesterday trying to get into his vehicle, but otherwise denies any recent injuries to the leg.  Current Outpatient Medications: . acetaminophen (TYLENOL) 325 MG tablet Take by mouth Take 2 tablets (650 mg total) by mouth every 6 (six) hours as needed for mild pain (or Fever >/= 101).  Marland Kitchen amLODIPine (NORVASC) 5 MG tablet Take 1 tablet (5 mg total) by mouth once daily for 180 days 90 tablet 1  . atorvastatin (LIPITOR) 20 MG tablet TAKE 1 TABLET BY MOUTH EVERY DAY 90 tablet 3  . blood glucose diagnostic (ONETOUCH ULTRA TEST) test strip Use 2 (two) times daily One touch ultra test strips e11.9 (Patient not taking: Reported on 03/14/2020 ) 200 each 3  . budesonide (PULMICORT) 0.5 mg/2 mL nebulizer solution Take 2 mLs (0.5 mg total) by nebulization once daily Take 2 mLs (0.5 mg total) by nebulization 2 (two) times daily. (Patient not taking: Reported on 05/09/2020 ) 60 mL 5  . donepeziL (ARICEPT) 5 MG tablet Take 1 tablet (5 mg total) by mouth nightly 30 tablet 11  . ergocalciferol, vitamin D2, 1,250 mcg (50,000 unit) capsule Take 1 capsule (50,000 Units total) by mouth once a  week for 30 days 4 capsule 11  . lancets (ONETOUCH ULTRASOFT) Use 1 each 3 (three) times daily (Patient not taking: Reported on 01/31/2020 ) 100 each 0  . metoprolol succinate (TOPROL-XL) 100 MG XL tablet TAKE 1 TABLET BY MOUTH EVERY DAY 90 tablet 3  . mirtazapine (REMERON) 15 MG tablet TAKE 1 TABLET BY MOUTH EVERY DAY AT NIGHT 90 tablet 1  . nystatin (MYCOSTATIN) 100,000 unit/gram powder Apply powder with each ostomy change as instructed.  Letta Pate DELICA LANCETS 33 gauge Misc USE 1 EACH 3 (THREE) TIMES A DAY (Patient not taking: Reported on 03/14/2020) 300 each 0  . ONETOUCH ULTRA BLUE TEST STRIP test strip USE 3 (THREE) TIMES DAILY (Patient not taking: Reported on 03/14/2020) 300 strip 0  . ostomy supplies (PREMIER UROSTOMY CONVEX POUCH) 1 1/4 " Misc APPLY TO AFFECTED AREA TWICE DAILY  . pen needle, diabetic (BD ULTRA-FINE SHORT PEN NEEDLE) 31 gauge x 5/16" needle as directed (Patient not taking: Reported on 05/09/2020 ) 200 each 5  . saliva stimulant comb. no.2 (BIOTENE ORALBALANCE, ENZYMES,) Liqd 10 mLs by Mucous Membrane route 3 (three) times daily (Patient not taking: Reported on 05/09/2020 ) 900 mL 5  . sertraline (ZOLOFT) 100 MG tablet TAKE 1 TABLET BY MOUTH EVERY DAY 90 tablet 3  . SPIRIVA WITH HANDIHALER 18 mcg inhalation capsule PLACE 1 CAPSULE (18 MCG TOTAL) INTO INHALER AND INHALE ONCE DAILY 30 capsule 3  . VIT C/VIT E ACETATE/LUTEIN/MIN (OCUVITE  LUTEIN ORAL) Take 1 tablet by mouth once daily.    No current Epic-ordered facility-administered medications on file.   Allergies:  . Codeine Anaphylaxis  . Morphine Anaphylaxis  . Stadol [Butorphanol Tartrate] Anaphylaxis  . Butorphanol Unknown  . Neurontin [Gabapentin] Unknown and Other (See Comments)  Unsure of what the reaction was  . Nucynta [Tapentadol] Nausea  ONSET 01/04/2013  . Lyrica [Pregabalin] Rash   Past Medical History:  . Anxiety  . Aortic atherosclerosis (CMS-HCC)  on CT imaging 07/2019  . Cervical DDD 02/21/2014  .  Chronic renal insufficiency  Dr. Thedore Mins  . Depression  . Femur fracture, left (CMS-HCC) 08/2018  . Gout  . Hip fracture (CMS-HCC)  BI LATERAL  . Hyperlipidemia  . Hypertension  . Low testosterone  . Polycythemia 05/2003  . Positive PPD  . Prostate cancer (CMS-HCC)  . Pulmonary embolus (CMS-HCC)  . Sleep apnea  . Stroke (CMS-HCC)  . Type 2 diabetes mellitus (CMS-HCC)   Past Surgical History:  . ADRENALECTOMY  . APPENDECTOMY  . BACK SURGERY  . Bilateral carotid endarterectomies with redo  . CHOLECYSTECTOMY  . FRACTURE SURGERY  . Irrigation and debridement with hardware removal status oist prior ORIF right supracondylar femur fracture Right 10/18/2018  Dr.Gurveer Colucci  . LAPAROSCOPIC PROSTATECTOMY  . Lumbar Three-Four,Lumbar Four-Five, Lumbar Five-Sacral One Posterior lumbar fusion. (N/A) decompression, pedicle crew fixation and posterolateral arthrodesis with local autograft 02/2018  Dr Maeola Harman  . MASTECTOMY PARTIAL / LUMPECTOMY  . Open reduction and internal fixation of periprosthetic right supracondylar femur fracture Right 08/24/2018  Dr.Vernette Moise  . PHEO surgery  . Total hip arthroplasty anterior approach Right 03/02/2017  Dr.Menz  . UPPP and nasel/tounge plexy   Family History  . Heart disease Father  . Stroke Father  . Pneumonia Mother  . No Known Problems Sister  . No Known Problems Maternal Grandmother  . No Known Problems Maternal Grandfather  . No Known Problems Paternal Grandmother  . No Known Problems Paternal Grandfather  . No Known Problems Son  . No Known Problems Son  . No Known Problems Daughter  . Diabetes Neg Hx   Social History   Socioeconomic History  . Marital status: Married  Spouse name: Not on file  . Number of children: 3  . Years of education: 56  . Highest education level: Not on file  Occupational History  . Not on file  Tobacco Use  . Smoking status: Former Smoker  Types: Cigarettes  Quit date: 02/21/1954  Years since quitting: 66.2   . Smokeless tobacco: Never Used  Substance and Sexual Activity  . Alcohol use: No  Alcohol/week: 0.0 standard drinks  . Drug use: No  . Sexual activity: Defer  Other Topics Concern  . Not on file  Social History Narrative  . Not on file   Social Determinants of Health   Financial Resource Strain:  . Difficulty of Paying Living Expenses:  Food Insecurity:  . Worried About Programme researcher, broadcasting/film/video in the Last Year:  . Barista in the Last Year:  Transportation Needs:  . Freight forwarder (Medical):  Marland Kitchen Lack of Transportation (Non-Medical):   Review of Systems:  A comprehensive 14 point ROS was performed, reviewed, and the pertinent orthopaedic findings are documented in the HPI.  Physical Exam: Vitals:  05/10/20 1324  BP: 132/80  Weight: 97.5 kg (215 lb)  Height: 175.3 cm (5\' 9" )  PainSc: 8  PainLoc: Leg   General/Constitutional: The patient appears to be well-nourished,  well-developed, and in no acute distress. Neuro/Psych: Normal mood and affect, oriented to person, place and time.  Right knee exam: The patient's gait is not assessed on today's visit ashepresents in a wheelchair.On inspection,his surgical incision remainswell-healed. Although there is no drainage, there does appear to be some swelling along the anterolateral aspect of the thigh with some erythema along the midportion of the thigh, including the proximal portion of his surgical incision. He has moderate tenderness to palpation along the surgical incision. There is no knee effusion and there isno medial or lateral joint line tendernessaroundthe knee. He has difficulty with any active range of motion of his knee due to weakness. Passively, his knee is able to be extended tonear15 degrees and flexed to90degrees.He hasno pain with knee range of motion, but does have hamstring tightness in extension. He isneurovascularly intact to the right lower extremity and foot.  X-rays/MRI/Lab data:   AP and lateral x-rays of the right femur are obtained. These films demonstrate excellent healing of the distal femoral fracture. No evidence for a sequestration or lytic process of the bone is noted to suggest a chronic osteomyelitis. The screw holes appear to be filling in well. No new acute bony abnormalities are identified.  Assessment: . Closed nondisplaced supracondylar fracture of distal end of right femur without intracondylar extension, sequela Yes   Plan: The treatment options were discussed with the patient and his wife. In addition, patient educational materials were provided regarding the diagnosis and treatment options. Although the patient's x-rays look good, his physical examination findings are concerning for a recurrent deep infection. Therefore, I have asked Dr. Landry Mellow to evaluate the patient's thigh under ultrasound to look for a fluid collection. If found, that he would proceed with aspirating it to look for evidence of recurrent infection. Dr. Landry Mellow did this procedure and was able to aspirate approximately 5 cc of purulent looking material, concerning for recurrent infection. Therefore, I have recommended an urgent surgical procedure to include a formal irrigation and debridement of the lateral right thigh wound. This will be arranged to be done in the next 3 to 4 days to provide time to perform a CT scan of the thigh looking for evidence of bone involvement. The procedure was discussed with the patient and his wife, as were the potential risks (including bleeding, persistent or recurrent infection, nerve and/or blood vessel injury, persistent or recurrent pain, need for further surgery, blood clots, strokes, heart attacks and/or arhythmias, pneumonia, etc.) and benefits. The patient states his understanding and wishes to proceed. He also understands that he will be required to stay in the hospital 3 to 4 days for IV antibiotics. All of the patient's questions and concerns were  answered. He can call any time with further concerns. He will follow up post-surgery, routine.   H&P reviewed and patient re-examined. No changes.

## 2020-05-14 NOTE — Plan of Care (Signed)
  Problem: Education: Goal: Knowledge of General Education information will improve Description Including pain rating scale, medication(s)/side effects and non-pharmacologic comfort measures Outcome: Progressing   

## 2020-05-15 ENCOUNTER — Encounter: Payer: Self-pay | Admitting: Surgery

## 2020-05-15 DIAGNOSIS — Z8701 Personal history of pneumonia (recurrent): Secondary | ICD-10-CM

## 2020-05-15 DIAGNOSIS — B9562 Methicillin resistant Staphylococcus aureus infection as the cause of diseases classified elsewhere: Secondary | ICD-10-CM

## 2020-05-15 DIAGNOSIS — R197 Diarrhea, unspecified: Secondary | ICD-10-CM

## 2020-05-15 DIAGNOSIS — L0889 Other specified local infections of the skin and subcutaneous tissue: Secondary | ICD-10-CM

## 2020-05-15 LAB — BASIC METABOLIC PANEL
Anion gap: 9 (ref 5–15)
BUN: 26 mg/dL — ABNORMAL HIGH (ref 8–23)
CO2: 26 mmol/L (ref 22–32)
Calcium: 8.4 mg/dL — ABNORMAL LOW (ref 8.9–10.3)
Chloride: 107 mmol/L (ref 98–111)
Creatinine, Ser: 1.41 mg/dL — ABNORMAL HIGH (ref 0.61–1.24)
GFR calc Af Amer: 52 mL/min — ABNORMAL LOW (ref >60)
GFR calc non Af Amer: 45 mL/min — ABNORMAL LOW (ref >60)
Glucose, Bld: 185 mg/dL — ABNORMAL HIGH (ref 70–99)
Potassium: 5.9 mmol/L — ABNORMAL HIGH (ref 3.5–5.1)
Sodium: 142 mmol/L (ref 135–145)

## 2020-05-15 LAB — CBC
HCT: 36.2 % — ABNORMAL LOW (ref 39.0–52.0)
Hemoglobin: 11.7 g/dL — ABNORMAL LOW (ref 13.0–17.0)
MCH: 29.6 pg (ref 26.0–34.0)
MCHC: 32.3 g/dL (ref 30.0–36.0)
MCV: 91.6 fL (ref 80.0–100.0)
Platelets: 148 10*3/uL — ABNORMAL LOW (ref 150–400)
RBC: 3.95 MIL/uL — ABNORMAL LOW (ref 4.22–5.81)
RDW: 12.8 % (ref 11.5–15.5)
WBC: 12.3 10*3/uL — ABNORMAL HIGH (ref 4.0–10.5)
nRBC: 0 % (ref 0.0–0.2)

## 2020-05-15 LAB — POTASSIUM: Potassium: 5.5 mmol/L — ABNORMAL HIGH (ref 3.5–5.1)

## 2020-05-15 MED ORDER — OXYCODONE-ACETAMINOPHEN 5-325 MG PO TABS
1.0000 | ORAL_TABLET | Freq: Four times a day (QID) | ORAL | Status: DC | PRN
  Administered 2020-05-15 (×2): 1 via ORAL
  Filled 2020-05-15 (×2): qty 1

## 2020-05-15 MED ORDER — RISAQUAD PO CAPS
2.0000 | ORAL_CAPSULE | Freq: Three times a day (TID) | ORAL | Status: DC
  Administered 2020-05-15 – 2020-05-17 (×8): 2 via ORAL
  Filled 2020-05-15 (×9): qty 2

## 2020-05-15 MED ORDER — SODIUM POLYSTYRENE SULFONATE 15 GM/60ML PO SUSP
30.0000 g | Freq: Once | ORAL | Status: AC
  Administered 2020-05-15: 30 g via ORAL
  Filled 2020-05-15: qty 120

## 2020-05-15 NOTE — Progress Notes (Signed)
PT Cancellation Note  Patient Details Name: Gary Thomas MRN: [id number] DOB: [birth year]   Cancelled Treatment:    Reason Eval/Treat Not Completed: Medical issues which prohibited therapy; Pt's Ka 5.5 falling outside guidelines for participation with PT services.  Will attempt to see pt at a future date/time as medically appropriate.     Ovidio Hanger PT, DPT 05/15/20, 8:48 AM

## 2020-05-15 NOTE — Consult Note (Signed)
NAME: Gary Thomas  DOB: [birth year]  MRN: [id number]  Date/Time: 05/15/2020 2:05 PM  REQUESTING PROVIDER: Dr. Joice LoftsPoggi Subjective:  REASON FOR CONSULT: Right thigh infection ?Chart reviewed spoke to patient as well Gary Thomas is a 86 y.o. male with a history of hypertension, renal insufficiency, sleep apnea, diabetes mellitus, right THA, right TKA, lumbar fusion surgery, right femur fracture status post ORIF is admitted for surgery because of a painful swelling of the right thigh Patient had hip surgery in 2018 and right knee replacement in 2014.  In May 2019 he had extensive lumbar surgery.  He presented to the ED in August 23, 2018 with fall and fracturing the right femur just proximal to the femoral component of his total knee arthroplasty.  It was a nondisplaced supracondylar fracture.  He underwent open reduction and internal fixation of the periprosthetic right supracondylar femur fracture on 08/24/2018.Marland Kitchen. He was followed by Dr. Joice LoftsPOGGI  as outpatient and on 10/18/2018 he underwent debridement of infection at the site of the surgery with removal of the hardware and the culture was positive for MRSA.  He was given a prolonged course of oral Bactrim for nearly 4 weeks.Marland Kitchen.  He also had a wound VAC.  The wound healed  discharged from the orthopedic clinic mid July 2020. Patient has been in the hospital for a few times with pneumonia following that.Marland Kitchen.  He returned to Ortho on 05/10/2020 complaining of right thigh pain swelling erythema over the past few weeks.  He did not have any fever.  He had a recent fall. Ortho ordered an x-ray on 730 which demonstrated excellent healing of the distal femoral fracture with no evidence for sequestration or lytic process of the bone to suggest chronic osteomyelitis.  But clinically there was concern for recurrent deep infection and he had an ultrasound which showed fluid collection in which was aspirated and 5 cc of purulent fluid was sent for culture.  It grew MRSA.   Patient was admitted to the hospital yesterday and was taken back to surgery and underwent debridement And removal of a shard of bone that was loose at the site of the previous ORIF.  It was sent for culture. I am seeing the patient for antibiotic recommendation.    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   AORTA SURGERY     CAROTID ARTERY - SUBCLAVIAN ARTERY BYPASS GRAFT Bilateral    CATARACT EXTRACTION W/ INTRAOCULAR LENS  IMPLANT, BILATERAL     CHOLECYSTECTOMY     EYE SURGERY     FEMUR FRACTURE SURGERY Right    HIP FRACTURE SURGERY Bilateral    I & D EXTREMITY Right 10/18/2018   Procedure: IRRIGATION AND DEBRIDEMENT EXTREMITY, POSSIBLE HARDWARE REMOVAL OF SUPERCONDYLAR PLATE;  Surgeon: Christena FlakePoggi, John J, MD;  Location: [facility];  Service: Orthopedics;  Laterality: Right;   I & D EXTREMITY Right 05/14/2020   Procedure: IRRIGATION AND DEBRIDEMENT OF RIGHT THIGH ABCESS;  Surgeon: Leron CroakPoggi, John  J, MD;  Location: [facility];  Service: Orthopedics;  Laterality: Right;   JOINT REPLACEMENT     ORIF FEMUR FRACTURE Right 08/24/2018   Procedure: OPEN REDUCTION INTERNAL FIXATION (ORIF) SUPRACONDYLAR FEMUR FRACTURE;  Surgeon: Christena Flake, MD;  Location: [facility];  Service: Orthopedics;  Laterality: Right;   ORIF FEMUR FRACTURE Right 10/18/2018   Procedure: HARDWARE REMOVAL- DISTAL FEMUR FRACTURE FIXATION;  Surgeon: Christena Flake, MD;  Location: [facility];  Service: Orthopedics;  Laterality: Right;   PROSTATE SURGERY     REPLACEMENT TOTAL KNEE Right      TOTAL HIP ARTHROPLASTY Right 03/02/2017   Procedure: TOTAL HIP ARTHROPLASTY ANTERIOR APPROACH;  Surgeon: Kennedy Bucker, MD;  Location: [facility];  Service: Orthopedics;  Laterality: Right;   VASCULAR SURGERY      Social History   Socioeconomic History   Marital status: Married    Spouse name: Not on file   Number of children: Not on file   Years of education: Not on file   Highest education level: Not on file  Occupational History   Not on file  Tobacco Use   Smoking status: Former Smoker    Packs/day: 1.00    Years: 7.00    Pack years: 7.00    Types: Cigarettes    Quit date: 02/18/1952    Years since quitting: 68.2   Smokeless tobacco: Former Forensic psychologist Use: Never used  Substance and Sexual Activity   Alcohol use: No   Drug use: No   Sexual activity: Not Currently  Other Topics Concern   Not on file  Social History Narrative   Lives at home with wife. Has PT and OT.   Social Determinants of Health   Financial Resource Strain:    Difficulty of Paying Living Expenses:   Food Insecurity:    Worried About Programme researcher, broadcasting/film/video in the Last Year:    Barista in the Last Year:   Transportation Needs:    Freight forwarder (Medical):    Lack of Transportation (Non-Medical):   Physical Activity:    Days of Exercise per Week:    Minutes of Exercise per Session:   Stress:    Feeling of Stress :   Social Connections:    Frequency of Communication with Friends and Family:    Frequency of Social Gatherings with Friends and Family:    Attends Religious Services:    Active Member of Clubs or Organizations:    Attends Engineer, structural:    Marital Status:   Intimate Partner Violence:    Fear of Current or Ex-Partner:    Emotionally Abused:    Physically Abused:    Sexually Abused:     Family History  Problem Relation Age of Onset   Heart attack Father    Stroke Father    Heart disease Father     Heart attack Mother    Allergies  Allergen Reactions   Butorphanol Anaphylaxis   Codeine Anaphylaxis   Morphine Anaphylaxis   Gabapentin Other (See Comments)    Unsure of what the reaction was    Lyrica [Pregabalin] Rash   Tapentadol Nausea Only    ONSET 01/04/2013   Tramadol     Hallucinations "I see giant spiders on ceiling". Pt also believes he is in [facility], but he is in [place].    ? Current Facility-Administered Medications  Medication Dose Route Frequency Provider Last Rate Last Admin   0.9 %  sodium chloride infusion   Intravenous Continuous Poggi, Excell Seltzer, MD 75 mL/hr at 05/14/20 2050 New Bag at 05/14/20 2050   acetaminophen (TYLENOL) tablet 1,000 mg  1,000 mg Oral Q6H Poggi, Excell Seltzer, MD   1,000 mg at 05/14/20 2142   acetaminophen (TYLENOL) tablet 325-650 mg  325-650 mg Oral Q6H PRN Poggi, Excell Seltzer, MD       acidophilus (RISAQUAD) capsule 2 capsule  2 capsule Oral TID Poggi, Excell Seltzer, MD   2 capsule at 05/15/20 1020   amLODipine (NORVASC) tablet 5 mg  5 mg Oral Daily Poggi, Excell Seltzer, MD   5 mg at 05/15/20 0914   atorvastatin (LIPITOR) tablet 20 mg  20 mg Oral QHS Poggi, Excell Seltzer, MD   20 mg at 05/14/20 2151   bisacodyl (DULCOLAX) suppository 10 mg  10 mg Rectal Daily PRN Poggi, Excell Seltzer, MD       diphenhydrAMINE (BENADRYL) 12.5 MG/5ML elixir 12.5-25 mg  12.5-25 mg Oral Q4H PRN Poggi, Excell Seltzer, MD       docusate sodium (COLACE) capsule 100 mg  100 mg Oral BID Poggi, Excell Seltzer, MD   100 mg at 05/15/20 0914   donepezil (ARICEPT) tablet 5 mg  5 mg Oral QHS Poggi, Excell Seltzer, MD   5 mg at 05/14/20 2142   enoxaparin (LOVENOX) injection 40 mg  40 mg Subcutaneous Q24H Poggi, Excell Seltzer, MD   40 mg at 05/15/20 0913   fluticasone (FLONASE) 50 MCG/ACT nasal spray 2 spray  2 spray Each Nare Daily Poggi, Excell Seltzer, MD   2 spray at 05/15/20 1018   furosemide (LASIX) tablet 40 mg  40 mg Oral Q M,W,F Poggi, Excell Seltzer, MD   40 mg at 05/15/20 0914   guaiFENesin-dextromethorphan (ROBITUSSIN DM) 100-10  MG/5ML syrup 5 mL  5 mL Oral Q4H PRN Poggi, Excell Seltzer, MD       liver oil-zinc oxide (DESITIN) 40 % ointment 1 application  1 application Topical Daily PRN Poggi, Excell Seltzer, MD       magnesium hydroxide (MILK OF MAGNESIA) suspension 30 mL  30 mL Oral Daily PRN Poggi, Excell Seltzer, MD       metoCLOPramide (REGLAN) tablet 5-10 mg  5-10 mg Oral Q8H PRN Poggi, Excell Seltzer, MD       Or   metoCLOPramide (REGLAN) injection 5-10 mg  5-10 mg Intravenous Q8H PRN Poggi, Excell Seltzer, MD       metoprolol succinate (TOPROL-XL) 24 hr tablet 100 mg  100 mg Oral Daily Poggi, Excell Seltzer, MD   100 mg at 05/15/20 0914   mirtazapine (REMERON) tablet 15 mg  15 mg Oral QHS Christena Flake, MD   15 mg at 05/14/20 2144   multivitamin-lutein (OCUVITE-LUTEIN) capsule 1 capsule  1 capsule Oral Daily Poggi, Excell Seltzer, MD   1 capsule at 05/15/20 0913   nystatin (MYCOSTATIN/NYSTOP) topical powder 1 application  1 application Topical Daily PRN Poggi, Excell Seltzer, MD       ondansetron [facility]) tablet 4 mg  4 mg Oral Q6H PRN Poggi, Excell Seltzer, MD       Or   ondansetron (ZOFRAN) injection 4 mg  4 mg Intravenous Q6H PRN Poggi, Excell Seltzer, MD       oxyCODONE-acetaminophen (PERCOCET/ROXICET) 5-325 MG per tablet 1 tablet  1 tablet Oral Q6H PRN Anson [place], PA-C       sertraline (ZOLOFT) tablet 100 mg  100 mg Oral QHS Poggi, Excell Seltzer, MD   100  mg at 05/14/20 2148   sodium phosphate (FLEET) 7-19 GM/118ML enema 1 enema  1 enema Rectal Once PRN Poggi, Excell Seltzer, MD       tiotropium [facility]) inhalation capsule [facility] use ONLY) 18 mcg  18 mcg Inhalation q morning - 10a Poggi, Excell Seltzer, MD   18 mcg at 05/15/20 1019   vancomycin (VANCOREADY) IVPB 1250 mg/250 mL  1,250 mg Intravenous Q24H Tressie Ellis, [place]       [START ON 05/17/2020] Vitamin D (Ergocalciferol) (DRISDOL) capsule 50,000 Units  50,000 Units Oral Q Fri Poggi, Excell Seltzer, MD         Abtx:  Anti-infectives (From admission, onward)   Start     Dose/Rate Route Frequency Ordered Stop   05/15/20 2200   vancomycin (VANCOREADY) IVPB 1250 mg/250 mL     Discontinue     1,250 mg 166.7 mL/hr over 90 Minutes Intravenous Every 24 hours 05/14/20 2201     05/15/20 0600  ceFAZolin (ANCEF) IVPB 2g/100 mL premix        2 g 200 mL/hr over 30 Minutes Intravenous On call to O.R. 05/14/20 1332 05/14/20 1627   05/14/20 2200  vancomycin (VANCOCIN) IVPB 1000 mg/200 mL premix  Status:  Discontinued        1,000 mg 200 mL/hr over 60 Minutes Intravenous Every 12 hours 05/14/20 1949 05/14/20 2152   05/14/20 2200  vancomycin (VANCOREADY) IVPB 2000 mg/400 mL        2,000 mg 200 mL/hr over 120 Minutes Intravenous  Once 05/14/20 2201 05/15/20 0056   05/14/20 1643  vancomycin (VANCOCIN) powder  Status:  Discontinued          As needed 05/14/20 1706 05/14/20 1718   05/14/20 1600  vancomycin (VANCOCIN) IVPB 1000 mg/200 mL premix  Status:  Discontinued        1,000 mg 200 mL/hr over 60 Minutes Intravenous  Once 05/14/20 1555 05/14/20 2142   05/14/20 1413  ceFAZolin (ANCEF) 2-4 GM/100ML-% IVPB       Note to Pharmacy: Agnes Lawrence  : cabinet override      05/14/20 1413 05/14/20 1601      REVIEW OF SYSTEMS:  Const: negative fever, negative chills, negative weight loss Eyes: negative diplopia or visual changes, negative eye pain ENT: negative coryza, negative sore throat Resp: Has intermittent cough and shortness of breath Also has trouble swallowing at times and can choke on food He says this started after he had surgery for sleep apnea many years ago. Cards: negative for chest pain, palpitations, lower extremity edema GU: negative for frequency, dysuria and hematuria GI: Negative for abdominal pain, diarrhea, bleeding, constipation Skin: negative for rash and pruritus Heme: negative for easy bruising and gum/nose bleeding MS: Patient is unable to walk because of weakness of legs and needs cueing and physical therapy to take a few steps.  He uses a wheelchair.  Patient says that he takes 2 BC powder every  day which she has been told not to take.  Neurolo patient says he has some memory problems and has been diagnosed with dementia. Psych: negative for feelings of anxiety, depression  Endocrine: Patient says he used to take insulin in the past but not anymore Allergy/Immunology-allergy to opioids ?  Objective:  VITALS:  BP (!) 145/64 (BP Location: Right Arm)    Pulse 85    Temp 97.8 F (36.6 C) (Oral)    Resp 17    Ht  (1.753 m)    Wt 102.6  kg    SpO2 90%    BMI 33.39 kg/m  PHYSICAL EXAM:  General: Alert, cooperative, no distress, appears stated age.  Head: Normocephalic, without obvious abnormality, atraumatic. Eyes: Conjunctivae clear, anicteric sclerae. Pupils are equal ENT Nares normal. No drainage or sinus tenderness. Lips, mucosa, and tongue normal. No Thrush Dentition poor Neck: Supple, symmetrical, no adenopathy, thyroid: non tender no carotid bruit and no JVD.  Lungs: Bilateral air entry.  Decreased air entry in the bases.Marland Kitchen Heart: Regular rate and rhythm, no murmur, rub or gallop. Abdomen: Soft, non-tender,not distended. Bowel sounds normal. No masses Extremities: Right leg surgical dressing, drain present Edema legs Skin: No rashes or lesions. Or bruising Lymph: Cervical, supraclavicular normal. Neurologic: Did not examine in detail. Pertinent Labs Lab Results CBC    Component Value Date/Time   WBC 12.3 (H) 05/15/2020 0544   RBC 3.95 (L) 05/15/2020 0544   HGB 11.7 (L) 05/15/2020 0544   HGB 10.5 (L) 12/12/2012 0422   HCT 36.2 (L) 05/15/2020 0544   HCT 31.1 (L) 12/12/2012 0422   PLT 148 (L) 05/15/2020 0544   PLT 161 12/12/2012 0422   MCV 91.6 05/15/2020 0544   MCV 91 12/12/2012 0422   MCH 29.6 05/15/2020 0544   MCHC 32.3 05/15/2020 0544   RDW 12.8 05/15/2020 0544   RDW 14.4 12/12/2012 0422   LYMPHSABS 0.4 (L) 11/17/2019 1113   LYMPHSABS 0.9 (L) 12/12/2012 0422   MONOABS 0.1 11/17/2019 1113   MONOABS 1.1 (H) 12/12/2012 0422   EOSABS 0.0 11/17/2019 1113     EOSABS 0.1 12/12/2012 0422   BASOSABS 0.0 11/17/2019 1113   BASOSABS 0.0 12/12/2012 0422   BASOSABS 0 05/10/2012 1012    CMP Latest Ref Rng & Units 05/15/2020 05/15/2020 05/04/2020  Glucose 70 - 99 mg/dL - 308(M) 578(I)  BUN 8 - 23 mg/dL - 69(G) 29(B)  Creatinine 0.61 - 1.24 mg/dL - 2.84(X) 3.24(M)  Sodium 135 - 145 mmol/L - 142 143  Potassium 3.5 - 5.1 mmol/L 5.5(H) 5.9(H) 4.4  Chloride 98 - 111 mmol/L - 107 109  CO2 22 - 32 mmol/L - 26 27  Calcium 8.9 - 10.3 mg/dL - 0.1(U) 2.7(O)  Total Protein 6.5 - 8.1 g/dL - - -  Total Bilirubin 0.3 - 1.2 mg/dL - - -  Alkaline Phos 38 - 126 U/L - - -  AST 15 - 41 U/L - - -  ALT 0 - 44 U/L - - -      Microbiology: Recent Results (from the past 240 hour(s))  SARS CORONAVIRUS 2 (TAT 6-24 HRS) Nasopharyngeal Nasopharyngeal Swab     Status: None   Collection Time: 05/13/20 11:43 AM   Specimen: Nasopharyngeal Swab  Result Value Ref Range Status   SARS Coronavirus 2 NEGATIVE NEGATIVE Final    Comment: (NOTE) SARS-CoV-2 target nucleic acids are NOT DETECTED.  The SARS-CoV-2 RNA is generally detectable in upper and lower respiratory specimens during the acute phase of infection. Negative results do not preclude SARS-CoV-2 infection, do not rule out co-infections with other pathogens, and should not be used as the sole basis for treatment or other patient management decisions. Negative results must be combined with clinical observations, patient history, and epidemiological information. The expected result is Negative.  Fact Sheet for Patients: HairSlick.no  Fact Sheet for Healthcare Providers: quierodirigir.com  This test is not yet approved or cleared by the [place] FDA and  has been authorized for detection and/or diagnosis of SARS-CoV-2 by FDA under an Emergency Use Authorization (  EUA). This EUA will remain  in effect (meaning this test can be used) for the duration of  the COVID-19 declaration under Se ction 564(b)(1) of the Act, 21 U.S.C. section 360bbb-3(b)(1), unless the authorization is terminated or revoked sooner.  Performed at [facility], [address] [place], [place] [address]   Aerobic/Anaerobic Culture (surgical/deep wound)     Status: None (Preliminary result)   Collection Time: 05/14/20  4:22 PM   Specimen: Wound  Result Value Ref Range Status   Specimen Description   Final    WOUND RIGHT THIGH Performed at [facility], [address] [place], [place] [address]    Special Requests   Final    NONE Performed at [facility], [address] [place], [place] [address]    Gram Stain   Final    FEW WBC PRESENT, PREDOMINANTLY PMN RARE GRAM POSITIVE COCCI    Culture   Final    CULTURE REINCUBATED FOR BETTER GROWTH Performed at [facility], [address], [place], [place] [address]    Report Status PENDING  Incomplete    IMAGING RESULTS:  I have personally reviewed the films ? Impression/Recommendation ?86 year old male with history of right hip replacement, right knee replacement, fracture of the supracondylar femur in 2019 followed by ORIF complicated by infection with MRSA leading to removal of the hardware in January 2020.  Was treated with Bactrim for nearly 4 weeks.  The wound healed only to present after nearly 18 months with swelling erythema and pain in the right leg.  Right thigh deep infection with MRSA. He underwent debridement and a shard of loose bone was removed.  Need to discuss with Ortho to figure out whether he has osteomyelitis or not or whether any bone was sent for biopsy. That would help guide antibiotics whether he would need IV versus oral antibiotic. Is currently on vancomycin.  Because of CKD need to monitor vancomycin levels closely.  I doubt he would be able to take long-term vancomycin.  Diarrhea following Kayexalate consumption for hyperkalemia.  CKD would stay away  from Toradol or other NSAIDs as he is also on vancomycin.  History of recurrent pneumonia.  This patient very likely  is aspirating because of difficulty in swallowing secondary to previous sleep apnea surgery.  Early neurocognitive impairment.  Patient is on Aricept mirtazapine and sertraline.  ___________________________________________________ Discussed with patient in detail We will discuss with Dr. Joice Lofts Note:  This document was prepared using Dragon voice recognition software and may include unintentional dictation errors.

## 2020-05-15 NOTE — Progress Notes (Signed)
Subjective: 1 Day Post-Op Procedure(s) (LRB): IRRIGATION AND DEBRIDEMENT OF RIGHT THIGH ABCESS (Right) Patient reports pain as 7 on 0-10 scale.   Patient is well, and has had no acute complaints or problems PT and Care management to assist with discharge planning. Negative for chest pain and shortness of breath Fever: no Gastrointestinal:Negative for nausea and vomiting K+ 5.5 this AM  Objective: Vital signs in last 24 hours: Temp:  [97 F (36.1 C)-98.1 F (36.7 C)] 97.8 F (36.6 C) (08/04 1134) Pulse Rate:  [72-95] 85 (08/04 1156) Resp:  [14-26] 17 (08/04 1134) BP: (132-200)/(60-76) 145/64 (08/04 1134) SpO2:  [87 %-98 %] 90 % (08/04 1156) Weight:  [102.6 kg] 102.6 kg (08/03 2033)  Intake/Output from previous day:  Intake/Output Summary (Last 24 hours) at 05/15/2020 1302 Last data filed at 05/15/2020 0952 Gross per 24 hour  Intake 2507.16 ml  Output 750 ml  Net 1757.16 ml    Intake/Output this shift: Total I/O In: 240 [P.O.:240] Out: 200 [Urine:200]  Labs: Recent Labs    05/15/20 0544  HGB 11.7*   Recent Labs    05/15/20 0544  WBC 12.3*  RBC 3.95*  HCT 36.2*  PLT 148*   Recent Labs    05/15/20 0544 05/15/20 0739  NA 142  --   K 5.9* 5.5*  CL 107  --   CO2 26  --   BUN 26*  --   CREATININE 1.41*  --   GLUCOSE 185*  --   CALCIUM 8.4*  --    No results for input(s): LABPT, INR in the last 72 hours.   EXAM General - Patient is Alert, Appropriate and Oriented Extremity - ABD soft Sensation intact distally Intact pulses distally  Bulky dressing is applied to the right right leg.  Hemovac intact with moderate bloody drainage. Motor Function - Moving toes well on exam.  Does have some evidence of drop foot, when the foot is passively dorsiflexed he is able to push down against my hand.  This is chronic for the patient.  Past Medical History:  Diagnosis Date   Depression    Diabetes mellitus without complication (HCC)    Dyspnea    Elevated  lipids    Femur fracture (HCC) 08/22/2018   GERD (gastroesophageal reflux disease)    Hypertension    Pulmonary emboli (HCC)    Renal insufficiency    only has 1 kidney   Sleep apnea    Stroke (HCC)    Rt side weakness    Assessment/Plan: 1 Day Post-Op Procedure(s) (LRB): IRRIGATION AND DEBRIDEMENT OF RIGHT THIGH ABCESS (Right) Active Problems:   Abscess of right thigh  Estimated body mass index is 33.39 kg/m as calculated from the following:   Height as of this encounter: 5\' 9"  (1.753 m).   Weight as of this encounter: 102.6 kg.  Advance diet as tolerated. Up with therapy when able. Patient with chronic right foot weakness. Labs reviewed, WBC 12.3, K+ 5.5 this AM.  Started on Kayexalate. Monitor K+ tomorrow morning, glucose 185.  Possibly start sliding scale which would also help with the patient's elevated K+ Will remove drain tomorrow Appreciate infectious disease consult.  DVT Prophylaxis - Lovenox Weight-Bearing as tolerated to right leg  J. , PA-C [facility] Orthopaedic Surgery 05/15/2020, 1:02 PM

## 2020-05-16 LAB — BASIC METABOLIC PANEL
Anion gap: 6 (ref 5–15)
BUN: 27 mg/dL — ABNORMAL HIGH (ref 8–23)
CO2: 24 mmol/L (ref 22–32)
Calcium: 8.3 mg/dL — ABNORMAL LOW (ref 8.9–10.3)
Chloride: 111 mmol/L (ref 98–111)
Creatinine, Ser: 1.47 mg/dL — ABNORMAL HIGH (ref 0.61–1.24)
GFR calc Af Amer: 49 mL/min — ABNORMAL LOW (ref >60)
GFR calc non Af Amer: 43 mL/min — ABNORMAL LOW (ref >60)
Glucose, Bld: 93 mg/dL (ref 70–99)
Potassium: 4.5 mmol/L (ref 3.5–5.1)
Sodium: 141 mmol/L (ref 135–145)

## 2020-05-16 LAB — CBC
HCT: 33.2 % — ABNORMAL LOW (ref 39.0–52.0)
Hemoglobin: 10.1 g/dL — ABNORMAL LOW (ref 13.0–17.0)
MCH: 28.8 pg (ref 26.0–34.0)
MCHC: 30.4 g/dL (ref 30.0–36.0)
MCV: 94.6 fL (ref 80.0–100.0)
Platelets: 142 10*3/uL — ABNORMAL LOW (ref 150–400)
RBC: 3.51 MIL/uL — ABNORMAL LOW (ref 4.22–5.81)
RDW: 13.3 % (ref 11.5–15.5)
WBC: 10.9 10*3/uL — ABNORMAL HIGH (ref 4.0–10.5)
nRBC: 0 % (ref 0.0–0.2)

## 2020-05-16 MED ORDER — OXYCODONE-ACETAMINOPHEN 5-325 MG PO TABS: 1.0000 | ORAL_TABLET | Freq: Four times a day (QID) | ORAL | Status: DC | PRN

## 2020-05-16 MED ORDER — OXYCODONE-ACETAMINOPHEN 5-325 MG PO TABS: 1.0000 | ORAL_TABLET | Freq: Four times a day (QID) | ORAL | 0 refills | Status: AC | PRN

## 2020-05-16 MED ORDER — OXYCODONE-ACETAMINOPHEN 5-325 MG PO TABS
1.0000 | ORAL_TABLET | Freq: Four times a day (QID) | ORAL | Status: DC | PRN
  Administered 2020-05-16 (×2): 2 via ORAL
  Filled 2020-05-16 (×2): qty 2

## 2020-05-16 NOTE — Evaluation (Signed)
Clinical/Bedside Swallow Evaluation Patient Details  Name: Gary Thomas MRN: [id number] Date of Birth: 12-25-1931  Today's Date: 05/16/2020 Time: SLP Start Time (ACUTE ONLY): 1450 SLP Stop Time (ACUTE ONLY): 1550 SLP Time Calculation (min) (ACUTE ONLY): 60 min  Past Medical History:  Past Medical History:  Diagnosis Date  . Depression   . Diabetes mellitus without complication (HCC)   . Dyspnea   . Elevated lipids   . Femur fracture (HCC) 08/22/2018  . GERD (gastroesophageal reflux disease)   . Hypertension   . Pulmonary emboli (HCC)   . Renal insufficiency    only has 1 kidney  . Sleep apnea   . Stroke (HCC)    Rt side weakness   Past Surgical History:  Past Surgical History:  Procedure Laterality Date  . 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 AORTA SURGERY    . CAROTID ARTERY - SUBCLAVIAN ARTERY BYPASS GRAFT Bilateral   . CATARACT EXTRACTION W/ INTRAOCULAR LENS  IMPLANT, BILATERAL    . CHOLECYSTECTOMY    . EYE SURGERY    . FEMUR FRACTURE SURGERY Right   . HIP FRACTURE SURGERY Bilateral   . I & D EXTREMITY Right 10/18/2018   Procedure: IRRIGATION AND DEBRIDEMENT EXTREMITY, POSSIBLE HARDWARE REMOVAL OF SUPERCONDYLAR PLATE;  Surgeon: Christena Flake, MD;  Location: [facility];  Service: Orthopedics;  Laterality: Right;  . I & D EXTREMITY Right 05/14/2020   Procedure: IRRIGATION AND DEBRIDEMENT OF RIGHT THIGH ABCESS;  Surgeon: Christena Flake, MD;  Location: [facility];  Service: Orthopedics;  Laterality: Right;  . JOINT REPLACEMENT    . ORIF FEMUR FRACTURE Right 08/24/2018   Procedure: OPEN REDUCTION INTERNAL FIXATION (ORIF) SUPRACONDYLAR FEMUR FRACTURE;  Surgeon: Christena Flake, MD;  Location: [facility];  Service: Orthopedics;  Laterality: Right;  . ORIF FEMUR FRACTURE Right 10/18/2018   Procedure: HARDWARE REMOVAL- DISTAL FEMUR FRACTURE FIXATION;  Surgeon: Christena Flake, MD;  Location: [facility];  Service: Orthopedics;  Laterality: Right;  .  PROSTATE SURGERY    . REPLACEMENT TOTAL KNEE Right   . TOTAL HIP ARTHROPLASTY Right 03/02/2017   Procedure: TOTAL HIP ARTHROPLASTY ANTERIOR APPROACH;  Surgeon: Kennedy Bucker, MD;  Location: [facility];  Service: Orthopedics;  Laterality: Right;  Marland Kitchen VASCULAR SURGERY     HPI:  Pt is a 86 y.o. male w/ a PMH including anxiety/depression, GERD (not on a PPI?), HTN, DM, stroke, and surgical including UPPP and nasel/tounge plexy who presents for follow-up now 1.5 years status post a irrigation and debridement with hardware removal of his postoperative wound infection following an open reduction and internal fixation of a right supracondylar femur fracture just above a total knee arthroplasty. Over the past few weeks, he has began to notice some increased discomfort, swelling, and erythema over the lateral aspect of his right thigh. The patient has been admitted several times over the past 6 months for pneumonia, the most recent of which was on 05/01/2020. He was discharged on 05/06/2020, but continues to note worsening of his right thigh pain, swelling, and erythema, prompting him to request to be seen today. According to his wife, he has not had any fevers over the past several days. He did have a fall yesterday trying to get into his vehicle, but otherwise denies any recent injuries to the leg. Pt has a h/o pneumonia per chart notes. Unsure of degree of GERD. Pt had episodes of severe vomiting prior to admission per chart notes in  Feb. 2021. Unsure of pt's Baseline Cognitive status but MRI in 2017 per chart indicated: "Moderate Atrophy and advanced chronic ischemic change" then -- currently on Aricept per Med list now.  Assessment / Plan / Recommendation Clinical Impression  Pt appears to present w/ a grossly functional oropharyngeal phase swallow function w/ po trials this evaluation today. No gross oropharyngeal phase dysphagia noted w/ No immediate, overt clinical s/s of aspiration noted. However, pt appears to  present w/ min Cognitive decline, easily distracted and needed few verbal cues to redirect to task intermittently - pt is on Aricept per chart Meds. Pt wears a Top Denture plate, missing bottom dentition(molars) -- this can impact mastication of solids(meats). In addition, pt has had his Soft Palate removed (UPPP and nasal/tounge plexy ~10+ yrs ago) w/ residual impact on his swallowing if he is not attending to how he eats/drinks. Pt reported this has been ongoing since the surgery ~10 years ago but maybe "a little worse since I am older now". Pt also has GERD and endorsed s/s of REFLUX and Retrograde activity w/ meals. He is NOT on a PPI per chart notes and does not take one at home. He has had recent dxs of pneumonia per MD note; previous admission w/ pulmonary decline s/p Vomiting episodes. W/ any REFLUX or Retrograde activity, there is increased risk for aspiration of REFLUX material thus pulmonary impact. Pt endorses he has foods "come back up on me". W/ pt's involved Medical and Cognitive history, there are multiple factors that could increase risk for aspiration and pneumonia.   For this eval, pt was assisted in sitting upright for po trials - pt stated it was "really high up". He exhibited no immediate, overt coughing during/post trials of ice chips, thin liquids via Cup, then purees/soft solids. No decline in vocal quality or respiratory status during/post trials. A mild throat clearing was noted x1 post multiple sips of liquids but this did not reoccur when pt took Single Sips as instructed. Pt also instructed to use a f/u, Dry swallow post each bite and sip to clear any potential residue. During the oral phase, pt exhibited adequate bolus control w/ trials of liquids and foods; timely bolus management and oral clearing w/ trials w/ Smaller, moistened bites. OM exam revealed no unilateral lingual/labial weakness; surgical changes of soft palate noted. Pt was able to feed self w/ setup. Pt was  educated/encouraged on taking Smaller Bites/Sips Slowly for Best Control when swallowing. Recommend a Dysphagia level 3 (moistened foods, meats cut) diet w/ thin liquids via CUP; aspiration precautions; Pills in puree IF needed for safer swallowing; monitoring to assure an upright position w/ head forward during any po intake. Discussed aspiration precautions w/ pt; dysphagia and its impact on the Pulmonary status. Pt does appear at his Baseline w/ re: to swallowing in light of UPPP and nasel/tounge plexy -- as pt stated, he must "be careful" eating/drinking. Also discussed the impact of REFLUX -- suspect some s/s of Esophageal Dysmotility per pt's description at home. NSG updated and to reconsult ST services if any further education needs while admitted. Pt would be recommended to f/u w/ an objective swallow study(MBSS) Outpatient for further assessment if desired; pt stated felt he "didn't need that right now". Suggested following aspiration precautions and monitor pulmonary status next 4-6 weeks w/ his PCP. Pt agreed. SLP Visit Diagnosis: Dysphagia, pharyngoesophageal phase (R13.14) (Baseline)    Aspiration Risk  Mild aspiration risk (reduced when following precautions)    Diet Recommendation  Mech Soft diet (meats cut well, gravies added to moisten foods), Thin liquids VIA CUP -- No Straws. General aspiration precautions. REFLUX Precautions.   Medication Administration: Whole meds with puree (IF needed for safer swallowing)    Other  Recommendations Recommended Consults: Consider GI evaluation;Consider esophageal assessment Oral Care Recommendations: Oral care BID;Oral care before and after PO;Staff/trained caregiver to provide oral care (support) Other Recommendations:  (n/a)   Follow up Recommendations None (currently)      Frequency and Duration  (n/a)   (n/a)       Prognosis Prognosis for Safe Diet Advancement: Fair Barriers to Reach Goals: Cognitive deficits;Time post onset;Severity  of deficits;Behavior      Swallow Study   General Date of Onset: 05/14/20 HPI: Pt is a 86 y.o. male w/ a PMH including anxiety/depression, GERD (not on a PPI?), HTN, DM, stroke, and surgical including UPPP and nasel/tounge plexy who presents for follow-up now 1.5 years status post a irrigation and debridement with hardware removal of his postoperative wound infection following an open reduction and internal fixation of a right supracondylar femur fracture just above a total knee arthroplasty. Over the past few weeks, he has began to notice some increased discomfort, swelling, and erythema over the lateral aspect of his right thigh. The patient has been admitted several times over the past 6 months for pneumonia, the most recent of which was on 05/01/2020. He was discharged on 05/06/2020, but continues to note worsening of his right thigh pain, swelling, and erythema, prompting him to request to be seen today. According to his wife, he has not had any fevers over the past several days. He did have a fall yesterday trying to get into his vehicle, but otherwise denies any recent injuries to the leg. Pt has a h/o pneumonia per chart notes. Pt has a Baseline of Atrophy and Ischemic changes per previous MRI; on Aricept per Med list.  Type of Study: Bedside Swallow Evaluation Previous Swallow Assessment: 2020, 2021 Diet Prior to this Study: Regular;Thin liquids Temperature Spikes Noted: No (wbc 10.9) Respiratory Status: Nasal cannula History of Recent Intubation: No Behavior/Cognition: Alert;Cooperative;Pleasant mood;Distractible;Requires cueing (baseline cognitive decline(?)) Oral Cavity Assessment: Within Functional Limits Oral Care Completed by SLP: Yes Oral Cavity - Dentition: Dentures, top;Missing dentition (few on bottom) Vision: Functional for self-feeding Self-Feeding Abilities: Able to feed self;Needs assist;Needs set up Patient Positioning: Upright in bed (assisted fully for upright  position) Baseline Vocal Quality: Normal Volitional Cough: Strong Volitional Swallow: Able to elicit    Oral/Motor/Sensory Function Overall Oral Motor/Sensory Function: Within functional limits (grossly )   Ice Chips Ice chips: Within functional limits Presentation: Spoon (fed; 3 trials)   Thin Liquid Thin Liquid: Within functional limits (grossly) Presentation: Cup;Self Fed (10 trials, then sips when swallowing pills w/ NSG) Other Comments: encouraged pt to take Single sips Slowly; use 2 swallows to help clear    Nectar Thick Nectar Thick Liquid: Not tested   Honey Thick Honey Thick Liquid: Not tested   Puree Puree: Within functional limits Presentation: Self Fed;Spoon (6 trials) Other Comments: similar to thin liquids   Solid     Solid: Within functional limits Presentation: Self Fed;Spoon (5 trials) Other Comments: similar to thin liquids       Jerilynn Som, MS, CCC-SLP Jacquis Paxton 05/16/2020,4:31 PM

## 2020-05-16 NOTE — Progress Notes (Signed)
Subjective: 2 Days Post-Op Procedure(s) (LRB): IRRIGATION AND DEBRIDEMENT OF RIGHT THIGH ABCESS (Right) Patient reports pain as mild this morning, pain has improved. Patient is well, and has had no acute complaints or problems PT and Care management to assist with discharge planning. Negative for chest pain and shortness of breath Fever: no Gastrointestinal:Negative for nausea and vomiting Hyperkalemia has resolved.  Objective: Vital signs in last 24 hours: Temp:  [97.6 F (36.4 C)-97.8 F (36.6 C)] 97.8 F (36.6 C) (08/05 0717) Pulse Rate:  [74-83] 74 (08/05 0717) Resp:  [16-18] 18 (08/05 0717) BP: (141-153)/(55-69) 141/55 (08/05 0717) SpO2:  [95 %-98 %] 95 % (08/05 0717)  Intake/Output from previous day:  Intake/Output Summary (Last 24 hours) at 05/16/2020 1200 Last data filed at 05/16/2020 0805 Gross per 24 hour  Intake 240 ml  Output 410 ml  Net -170 ml    Intake/Output this shift: Total I/O In: -  Out: 200 [Urine:200]  Labs: Recent Labs    05/15/20 0544 05/16/20 0650  HGB 11.7* 10.1*   Recent Labs    05/15/20 0544 05/16/20 0650  WBC 12.3* 10.9*  RBC 3.95* 3.51*  HCT 36.2* 33.2*  PLT 148* 142*   Recent Labs    05/15/20 0544 05/15/20 0544 05/15/20 0739 05/16/20 0650  NA 142  --   --  141  K 5.9*   < > 5.5* 4.5  CL 107  --   --  111  CO2 26  --   --  24  BUN 26*  --   --  27*  CREATININE 1.41*  --   --  1.47*  GLUCOSE 185*  --   --  93  CALCIUM 8.4*  --   --  8.3*   < > = values in this interval not displayed.   No results for input(s): LABPT, INR in the last 72 hours.   EXAM General - Patient is Alert, Appropriate and Oriented Extremity - ABD soft Sensation intact distally Intact pulses distally  Bulky dressing is applied to the right leg.  Dressing removed, and hemovac removed without complication. Incision is healing well without any erythema or purulent drainage. Honeycomb dressings were applied to the right leg. Intact to light touch  to the right leg, evidence of chronic foot drop. Able to perform a straight leg raise. Motor Function - Moving toes well on exam.  Does have some evidence of drop foot, when the foot is passively dorsiflexed he is able to push down against my hand.  This is chronic for the patient.  Past Medical History:  Diagnosis Date  . Depression   . Diabetes mellitus without complication (HCC)   . Dyspnea   . Elevated lipids   . Femur fracture (HCC) 08/22/2018  . GERD (gastroesophageal reflux disease)   . Hypertension   . Pulmonary emboli (HCC)   . Renal insufficiency    only has 1 kidney  . Sleep apnea   . Stroke (HCC)    Rt side weakness    Assessment/Plan: 2 Days Post-Op Procedure(s) (LRB): IRRIGATION AND DEBRIDEMENT OF RIGHT THIGH ABCESS (Right) Active Problems:   Abscess of right thigh  Estimated body mass index is 33.39 kg/m as calculated from the following:   Height as of this encounter: 5\' 9"  (1.753 m).   Weight as of this encounter: 102.6 kg.  Advance diet as tolerated. Up with therapy when able, WBAT to the right leg. Patient with chronic right foot weakness. Labs reviewed, WBC 10.9, no recent fevers.  Kayexalate given yesterday which caused diarrhea, Hyperkalemia has resolved. Currently on IV Vanc, will change antibiotics based on infectious disease recommendation. CT scan prior to the surgery did not show any evidence for osteomyelitis and I don't believe there was any evidence during the surgery. Drain removed today. Patient was started on oxycodone yesterday for pain, use sparingly today.  DVT Prophylaxis - Lovenox Weight-Bearing as tolerated to right leg  J. Horris Latino, PA-C [facility] 05/16/2020, 12:00 PM

## 2020-05-16 NOTE — Progress Notes (Signed)
Pt sleeping. Call bell within reach  

## 2020-05-16 NOTE — Progress Notes (Signed)
Pt laying in bed, Pt denies any pain. Pain denies any needs at this time. Call bell within reach and RN number on pt's board.

## 2020-05-16 NOTE — Progress Notes (Signed)
Pt in bed, denies any needs at this time. Pain 2/10. Call bell within reach

## 2020-05-16 NOTE — Plan of Care (Signed)

## 2020-05-16 NOTE — Anesthesia Postprocedure Evaluation (Signed)
Anesthesia Post Note  Patient: Surveyor, minerals  Procedure(s) Performed: IRRIGATION AND DEBRIDEMENT OF RIGHT THIGH ABCESS (Right Thigh)  Patient location during evaluation: PACU Anesthesia Type: General Level of consciousness: awake and alert Pain management: pain level controlled Vital Signs Assessment: post-procedure vital signs reviewed and stable Respiratory status: spontaneous breathing, nonlabored ventilation, respiratory function stable and patient connected to nasal cannula oxygen Cardiovascular status: blood pressure returned to baseline and stable Postop Assessment: no apparent nausea or vomiting Anesthetic complications: no   No complications documented.   Last Vitals:  Vitals:   05/15/20 2346 05/16/20 0717  BP: (!) 153/69 (!) 141/55  Pulse: 78 74  Resp: 16 18  Temp: 36.4 C 36.6 C  SpO2: 98% 95%    Last Pain:  Vitals:   05/16/20 0924  TempSrc:   PainSc: 2                  Lenard Simmer

## 2020-05-16 NOTE — Evaluation (Signed)
Physical Therapy Evaluation Patient Details Name: Gary Thomas MRN: [id number] DOB: 25-Oct-1931 Today's Date: 05/16/2020   History of Present Illness  Pt is an 86 y.o. male w/ PMH including: anxiety, aortic atherosclerosis, cervical DDD, chronic renal insufficiency, depression, L femur fx 2019, gout, bilateral hip fx, HLD, HTN, prostate cancer, pulmonary embolus, sleep apnea, CVA, DM, back surgery, R TKR, R THR and ORIF. Per MD assessment, pt is currently s/p irrigation and debridement of R thigh abscess.  Clinical Impression  Pt was pleasant and motivated to participate during the session. Pt stated moderate RLE pain this afternoon and seemed somewhat confused regarding what has been done to his leg over the past few days. At baseline this session, SpO2 94% and HR 69bpm. Pt stated that he had been receiving HHPT at home and they had gotten him to walk 5-6 steps w/ assistance; however, he is primarily non-ambulatory and uses WC or scooter. Pt required mod-A from therapist for SLR and hip abduction today 2/2 weakness and pain. Pt generally mod-I w/ bed mobility w/ heavy use of rails and overhead trapeze; min-A required for sit-to-supine for BLE management. Pt stated he has a similar bed setup at home, so this is likely his baseline. Pt appeared somewhat unsteady in sitting w/ notable sway and required at least one UE support. Pt attempted sit-to-stand but unable even w/ +2 max-A. Pt mod-I w/ scooting: able to scoot up to [facility] w/ significant effort and extra time. SpO2 periodically dropped to 89% following exertional efforts throughout session. While pt uses a sliding board to transfer at baseline, pt reported a number of falls in the past year (see prior function) and likely unsafe to return home at current level of function. Pt will benefit from PT services in a SNF setting upon discharge to safely address deficits listed in patient problem list for decreased caregiver assistance and eventual return to  PLOF.     Follow Up Recommendations SNF;Supervision for mobility/OOB    Equipment Recommendations  None recommended by PT    Recommendations for Other Services       Precautions / Restrictions Precautions Precautions: Fall Restrictions Weight Bearing Restrictions: Yes RLE Weight Bearing: Weight bearing as tolerated      Mobility  Bed Mobility Overal bed mobility: Needs Assistance Bed Mobility: Rolling;Supine to Sit;Sit to Supine Rolling: Modified independent (Device/Increase time)   Supine to sit: Modified independent (Device/Increase time) Sit to supine: Min assist   General bed mobility comments: generally extra time and effort w/ significant use of rails and trapeze; min-A for BLE management w/ sit-to-supine  Transfers Overall transfer level: Needs assistance              Lateral/Scoot Transfers: Min guard (up to [facility]; effortful and extra time required) General transfer comment: Sit-to-stand attempted; pt unable to perform w/ +2 max-A  Ambulation/Gait             General Gait Details: unable to ambulate at this time; could not stand up from bed  Stairs            Wheelchair Mobility    Modified Rankin (Stroke Patients Only)       Balance Overall balance assessment: Needs assistance Sitting-balance support: Feet unsupported;Feet supported;Single extremity supported Sitting balance-Leahy Scale: Fair Sitting balance - Comments: Pt appeared to have some instability / postural sway in sitting; required single UE support       Standing balance comment: pt unable to attain/maintain standing even with very heavy assist  Pertinent Vitals/Pain Pain Assessment: 0-10 Pain Score: 5  Pain Location: R leg Pain Descriptors / Indicators: Aching;Sore Pain Intervention(s): Limited activity within patient's tolerance;Monitored during session;Repositioned;Ice applied    Home Living Family/patient expects to be  discharged to:: Private residence Living Arrangements: Spouse/significant other (wife) Available Help at Discharge: Family Type of Home: House Home Access: Ramped entrance     Home Layout: One level Home Equipment: Environmental consultant - 2 wheels;Cane - single point;Shower seat;Bedside commode;Hospital bed;Wheelchair - IT trainer;Other (comment) Additional Comments: trapeze above bed, multiple sliding boards    Prior Function Level of Independence: Needs assistance   Gait / Transfers Assistance Needed: Pt reports that he uses a homemade slide board and can get to/from wc/bed w/o assist. Pt recieved a medical grade sliding board his last hospital visit, but pt states he still uses his homemade one. Pt states wife is around 24/7 but cannot help much as, per pt, wife had spine surgery last year to correct scoliosis.  ADL's / Homemaking Assistance Needed: Wife assists with bathing, pericare (wears depends), and LBD  Comments: Pt reports that he uses a homemade slide board and can get to/from wc/bed w/o assist; wife helps sometimes. Pt states sponge baths and hasnt showered/bathed in years. Wife assists with bathing/toileting clean up, some ADLs but he reports relative indepence with mobility, etc. Pt states currently working w/ HHPT and can walk 5-6 steps. Pt reports occasional falls w/ sliding board transfer to car; falls into crack and states EMS has to help him up when this happens.     Hand Dominance   Dominant Hand: Right    Extremity/Trunk Assessment   Upper Extremity Assessment Upper Extremity Assessment: Generalized weakness    Lower Extremity Assessment Lower Extremity Assessment: Generalized weakness       Communication   Communication: No difficulties  Cognition Arousal/Alertness: Awake/alert Behavior During Therapy: WFL for tasks assessed/performed Overall Cognitive Status: No family/caregiver present to determine baseline cognitive functioning                                  General Comments: A&Ox4 today; some confusion noted w/ pt history. Pt also states he was dx w/ dementia 39mo ago.      General Comments      Exercises Total Joint Exercises Ankle Circles/Pumps: AROM;Both;10 reps Hip ABduction/ADduction: AAROM;Both;10 reps Straight Leg Raises: AAROM;Both;10 reps Long Arc Quad: AROM;Both;10 reps   Assessment/Plan    PT Assessment Patient needs continued PT services  PT Problem List Decreased strength;Decreased range of motion;Decreased activity tolerance;Decreased balance;Decreased mobility;Decreased knowledge of use of DME;Decreased safety awareness;Pain       PT Treatment Interventions DME instruction;Gait training;Functional mobility training;Therapeutic activities;Therapeutic exercise;Balance training;Patient/family education    PT Goals (Current goals can be found in the Care Plan section)  Acute Rehab PT Goals Patient Stated Goal: get stronger PT Goal Formulation: With patient Time For Goal Achievement: 05/29/20 Potential to Achieve Goals: Fair    Frequency Min 2X/week   Barriers to discharge        Co-evaluation               AM-PAC PT "6 Clicks" Mobility  Outcome Measure Help needed turning from your back to your side while in a flat bed without using bedrails?: None Help needed moving from lying on your back to sitting on the side of a flat bed without using bedrails?: None Help needed moving to and from a bed  to a chair (including a wheelchair)?: A Little Help needed standing up from a chair using your arms (e.g., wheelchair or bedside chair)?: Total Help needed to walk in hospital room?: Total Help needed climbing 3-5 steps with a railing? : Total 6 Click Score: 14    End of Session Equipment Utilized During Treatment: Gait belt;Oxygen Activity Tolerance: Patient tolerated treatment well Patient left: with bed alarm set;with call bell/phone within reach;in bed;with SCD's reapplied Nurse  Communication: Mobility status PT Visit Diagnosis: Muscle weakness (generalized) (M62.81);Difficulty in walking, not elsewhere classified (R26.2);Repeated falls (R29.6);Unsteadiness on feet (R26.81);Pain Pain - Right/Left: Right Pain - part of body: Leg    Time: 0258-5277 PT Time Calculation (min) (ACUTE ONLY): 41 min   Charges:              Thereasa Distance SPT 05/16/20, 4:54 PM

## 2020-05-17 LAB — CBC
HCT: 34.1 % — ABNORMAL LOW (ref 39.0–52.0)
Hemoglobin: 10.9 g/dL — ABNORMAL LOW (ref 13.0–17.0)
MCH: 29.2 pg (ref 26.0–34.0)
MCHC: 32 g/dL (ref 30.0–36.0)
MCV: 91.4 fL (ref 80.0–100.0)
Platelets: 128 10*3/uL — ABNORMAL LOW (ref 150–400)
RBC: 3.73 MIL/uL — ABNORMAL LOW (ref 4.22–5.81)
RDW: 13.2 % (ref 11.5–15.5)
WBC: 7.1 10*3/uL (ref 4.0–10.5)
nRBC: 0 % (ref 0.0–0.2)

## 2020-05-17 LAB — BASIC METABOLIC PANEL
Anion gap: 7 (ref 5–15)
BUN: 22 mg/dL (ref 8–23)
CO2: 26 mmol/L (ref 22–32)
Calcium: 8.4 mg/dL — ABNORMAL LOW (ref 8.9–10.3)
Chloride: 109 mmol/L (ref 98–111)
Creatinine, Ser: 1.23 mg/dL (ref 0.61–1.24)
GFR calc Af Amer: >60 mL/min (ref >60)
GFR calc non Af Amer: 53 mL/min — ABNORMAL LOW (ref >60)
Glucose, Bld: 81 mg/dL (ref 70–99)
Potassium: 4.6 mmol/L (ref 3.5–5.1)
Sodium: 142 mmol/L (ref 135–145)

## 2020-05-17 MED ORDER — LINEZOLID 600 MG PO TABS
600.0000 mg | ORAL_TABLET | Freq: Two times a day (BID) | ORAL | Status: DC
  Administered 2020-05-17: 600 mg via ORAL
  Filled 2020-05-17 (×2): qty 1

## 2020-05-17 MED ORDER — LINEZOLID 600 MG PO TABS: 600.0000 mg | ORAL_TABLET | Freq: Two times a day (BID) | ORAL | 0 refills | Status: AC

## 2020-05-17 MED ORDER — HYDRALAZINE HCL 20 MG/ML IJ SOLN: 10.0000 mg | Freq: Four times a day (QID) | INTRAMUSCULAR | Status: DC | PRN

## 2020-05-17 NOTE — Plan of Care (Signed)

## 2020-05-17 NOTE — Care Management Important Message (Signed)
Important Message  Patient Details  Name: JERAME HEDDING MRN: [id number] Date of Birth: 1932/07/07   Medicare Important Message Given:  Yes     Barrie Dunker, RN 05/17/2020, 11:05 AM

## 2020-05-17 NOTE — Progress Notes (Signed)
Pt wet but it doesn't appear urostomy bag is leaking, ice pack seemed to be leaking. RN cleaned pt and changed bed sheets. Pt denies any further needs at this time. Call bell within reach

## 2020-05-17 NOTE — Progress Notes (Signed)
Subjective: 3 Days Post-Op Procedure(s) (LRB): IRRIGATION AND DEBRIDEMENT OF RIGHT THIGH ABCESS (Right) Patient reports pain as mild this morning, pain has improved. Patient is well, and has had no acute complaints or problems Continue with PT today. Negative for chest pain and shortness of breath Fever: no Gastrointestinal:Negative for nausea and vomiting Hyperkalemia has resolved.  Has not had any additional diarrhea after first day when given dose of kayexalate.  Objective: Vital signs in last 24 hours: Temp:  [97.7 F (36.5 C)-98.1 F (36.7 C)] 98.1 F (36.7 C) (08/06 0717) Pulse Rate:  [67-83] 83 (08/06 0717) Resp:  [16-20] 20 (08/06 0717) BP: (146-188)/(68-72) 188/72 (08/06 0717) SpO2:  [93 %-98 %] 98 % (08/06 0717)  Intake/Output from previous day:  Intake/Output Summary (Last 24 hours) at 05/17/2020 0836 Last data filed at 05/17/2020 0720 Gross per 24 hour  Intake 360 ml  Output 1900 ml  Net -1540 ml    Intake/Output this shift: Total I/O In: -  Out: 400 [Urine:400]  Labs: Recent Labs    05/15/20 0544 05/16/20 0650 05/17/20 0516  HGB 11.7* 10.1* 10.9*   Recent Labs    05/16/20 0650 05/17/20 0516  WBC 10.9* 7.1  RBC 3.51* 3.73*  HCT 33.2* 34.1*  PLT 142* 128*   Recent Labs    05/16/20 0650 05/17/20 0516  NA 141 142  K 4.5 4.6  CL 111 109  CO2 24 26  BUN 27* 22  CREATININE 1.47* 1.23  GLUCOSE 93 81  CALCIUM 8.3* 8.4*   No results for input(s): LABPT, INR in the last 72 hours.   EXAM General - Patient is Alert, Appropriate and Oriented Extremity - ABD soft Sensation intact distally Intact pulses distally  Incision intact without any drainage to the right leg. No erythema or purulent drainage. Intact to light touch to the right leg, evidence of chronic foot drop. Able to perform a straight leg raise. Motor Function - Moving toes well on exam.  Does have some evidence of drop foot, when the foot is passively dorsiflexed he is able to push  down against my hand.  This is chronic for the patient.  Past Medical History:  Diagnosis Date  . Depression   . Diabetes mellitus without complication (HCC)   . Dyspnea   . Elevated lipids   . Femur fracture (HCC) 08/22/2018  . GERD (gastroesophageal reflux disease)   . Hypertension   . Pulmonary emboli (HCC)   . Renal insufficiency    only has 1 kidney  . Sleep apnea   . Stroke (HCC)    Rt side weakness    Assessment/Plan: 3 Days Post-Op Procedure(s) (LRB): IRRIGATION AND DEBRIDEMENT OF RIGHT THIGH ABCESS (Right) Active Problems:   Abscess of right thigh  Estimated body mass index is 33.39 kg/m as calculated from the following:   Height as of this encounter: 5\' 9"  (1.753 m).   Weight as of this encounter: 102.6 kg.  Advance diet as tolerated. Up with therapy when able, WBAT to the right leg. Patient at baseline does have difficulty moving around, plan will be for discharge home. Patient with chronic right foot weakness. CBC ordered for today. Hyperkalemia has resolved. Culture from surgery demonstrated rare staph aureus, sensitivities not available yet, aspiration from the office demonstrated sensitivities to Gentamicin, Linezolid, Rifampin, Bactrim and Vancomycin. Currently on IV Vanc, will change antibiotics based on infectious disease recommendation. CT scan prior to the surgery did not show any evidence for osteomyelitis and I don't believe there was  any evidence during the surgery. If able to determine appropriate antibiotics today will plan on discharge home this afternoon.  DVT Prophylaxis - Lovenox Weight-Bearing as tolerated to right leg  J. Horris Latino, PA-C [facility] 05/17/2020, 8:36 AM

## 2020-05-17 NOTE — Progress Notes (Signed)
Pt sitting in bed eating breakfast. Pt denies any pain right now. Pt's BP high, RN gave scheduled BP medication and notified provider. Urostomy emptied, output, bag has a good seal. Pt denies any further needs at this time. Call bell within reach, RN and Tech numbers on pt board.

## 2020-05-17 NOTE — Progress Notes (Signed)
Pt urostomy bag leaking, RN changed bag and cleaned up pt. Pt dressed. RN removed IV, site clean, dry and intact. RN and pt and pt's wife reviewed discharge summary and education. All questions answered, pt denies any further needs at this time. Call bell in reach. Waiting on wheelchair to discharge.

## 2020-05-17 NOTE — Plan of Care (Signed)
°  Problem: Education: Goal: Knowledge of General Education information will improve Description: Including pain rating scale, medication(s)/side effects and non-pharmacologic comfort measures 05/17/2020 1426 by Meyer Cory, RN Outcome: Adequate for Discharge 05/17/2020 1157 by Meyer Cory, RN Outcome: Progressing   Problem: Health Behavior/Discharge Planning: Goal: Ability to manage health-related needs will improve 05/17/2020 1426 by Meyer Cory, RN Outcome: Adequate for Discharge 05/17/2020 1157 by Meyer Cory, RN Outcome: Progressing   Problem: Clinical Measurements: Goal: Ability to maintain clinical measurements within normal limits will improve 05/17/2020 1426 by Meyer Cory, RN Outcome: Adequate for Discharge 05/17/2020 1157 by Meyer Cory, RN Outcome: Progressing Goal: Will remain free from infection 05/17/2020 1426 by Meyer Cory, RN Outcome: Adequate for Discharge 05/17/2020 1157 by Meyer Cory, RN Outcome: Progressing Goal: Diagnostic test results will improve 05/17/2020 1426 by Meyer Cory, RN Outcome: Adequate for Discharge 05/17/2020 1157 by Meyer Cory, RN Outcome: Progressing Goal: Respiratory complications will improve 05/17/2020 1426 by Meyer Cory, RN Outcome: Adequate for Discharge 05/17/2020 1157 by Meyer Cory, RN Outcome: Progressing Goal: Cardiovascular complication will be avoided 05/17/2020 1426 by Meyer Cory, RN Outcome: Adequate for Discharge 05/17/2020 1157 by Meyer Cory, RN Outcome: Progressing   Problem: Activity: Goal: Risk for activity intolerance will decrease 05/17/2020 1426 by Meyer Cory, RN Outcome: Adequate for Discharge 05/17/2020 1157 by Meyer Cory, RN Outcome: Progressing   Problem: Nutrition: Goal: Adequate nutrition will be maintained 05/17/2020 1426 by Meyer Cory, RN Outcome: Adequate for Discharge 05/17/2020 1157 by Meyer Cory, RN Outcome: Progressing   Problem: Coping: Goal: Level of anxiety  will decrease 05/17/2020 1426 by Meyer Cory, RN Outcome: Adequate for Discharge 05/17/2020 1157 by Meyer Cory, RN Outcome: Progressing   Problem: Elimination: Goal: Will not experience complications related to bowel motility 05/17/2020 1426 by Meyer Cory, RN Outcome: Adequate for Discharge 05/17/2020 1157 by Meyer Cory, RN Outcome: Progressing Goal: Will not experience complications related to urinary retention 05/17/2020 1426 by Meyer Cory, RN Outcome: Adequate for Discharge 05/17/2020 1157 by Meyer Cory, RN Outcome: Progressing   Problem: Pain Managment: Goal: General experience of comfort will improve 05/17/2020 1426 by Meyer Cory, RN Outcome: Adequate for Discharge 05/17/2020 1157 by Meyer Cory, RN Outcome: Progressing   Problem: Safety: Goal: Ability to remain free from injury will improve 05/17/2020 1426 by Meyer Cory, RN Outcome: Adequate for Discharge 05/17/2020 1157 by Meyer Cory, RN Outcome: Progressing   Problem: Skin Integrity: Goal: Risk for impaired skin integrity will decrease 05/17/2020 1426 by Meyer Cory, RN Outcome: Adequate for Discharge 05/17/2020 1157 by Meyer Cory, RN Outcome: Progressing

## 2020-05-17 NOTE — Discharge Instructions (Signed)
Diet: As you were doing prior to hospitalization   Shower:  May shower but keep the wounds dry.  Dressing:  Leave dressing on the right leg until skin check next week.  Activity:  Increase activity slowly as tolerated, but follow the weight bearing instructions below.  No lifting or driving for 6 weeks.  Weight Bearing:   Weight bearing as tolerated to right lower extremity  Take antibiotic as directed.  To prevent constipation: you may use a stool softener such as -  Colace (over the counter) 100 mg by mouth twice a day  Drink plenty of fluids (prune juice may be helpful) and high fiber foods Miralax (over the counter) for constipation as needed.    Itching:  If you experience itching with your medications, try taking only a single pain pill, or even half a pain pill at a time.  You may take up to 10 pain pills per day, and you can also use benadryl over the counter for itching or also to help with sleep.   Precautions:  If you experience chest pain or shortness of breath - call 911 immediately for transfer to the hospital emergency department!!  If you develop a fever greater that 101 F, purulent drainage from wound, increased redness or drainage from wound, or calf pain-Call [facility]                                              Follow- Up Appointment:  Please call for an appointment to be seen in 2 weeks at [facility]

## 2020-05-17 NOTE — Discharge Summary (Signed)
Physician Discharge Summary  Patient ID: LEIBY PIGEON MRN: [id number] DOB/AGE: 11/17/1931 86 y.o.  Admit date: 05/14/2020 Discharge date: 05/17/2020  Admission Diagnoses:  Abscess of right thigh [L02.415]  Discharge Diagnoses: Patient Active Problem List   Diagnosis Date Noted  . Abscess of right thigh 05/14/2020  . Nocturnal hypoxia   . Acute kidney injury superimposed on CKD (HCC)   . Hyperkalemia   . COPD exacerbation (HCC)   . Hypokalemia   . HCAP (healthcare-associated pneumonia) 12/30/2019  . Lobar pneumonia (HCC) 12/29/2019  . Community acquired pneumonia 11/17/2019  . Acute hypoxemic respiratory failure (HCC) 11/17/2019  . GI bleed 02/07/2019  . Postoperative wound infection 10/17/2018  . Supracondylar fracture of distal end of femur without intracondylar extension (HCC) 10/17/2018  . Sepsis (HCC) 10/06/2018  . Chronic pulmonary embolism (HCC) 09/06/2018  . Primary osteoarthritis of right knee 09/06/2018  . Status post right knee replacement 09/06/2018  . Dyslipidemia associated with type 2 diabetes mellitus (HCC) 09/06/2018  . CKD stage 3 due to type 2 diabetes mellitus (HCC) 09/06/2018  . Type 2 diabetes mellitus with hyperlipidemia (HCC) 09/06/2018  . Chronic cerebrovascular accident (CVA) 09/06/2018  . GERD without esophagitis 09/06/2018  . Closed nondisplaced supracondylar fracture of distal end of femur without intracondylar extension (HCC) 09/06/2018  . Acute on chronic anemia 09/06/2018  . Depression, major, single episode, in partial remission (HCC) 09/06/2018  . Bilateral lower extremity edema 09/06/2018  . Chronic gout due to renal impairment 09/06/2018  . Acute respiratory failure with hypoxia (HCC) 08/28/2018  . Spondylolisthesis of lumbar region 02/15/2018  . PAD (peripheral artery disease) (HCC) 08/19/2017  . Hyperlipidemia 07/05/2017  . Essential hypertension 07/05/2017  . Bilateral carotid artery stenosis 07/05/2017  . Primary localized  osteoarthritis of right hip 03/02/2017    Past Medical History:  Diagnosis Date  . Depression   . Diabetes mellitus without complication (HCC)   . Dyspnea   . Elevated lipids   . Femur fracture (HCC) 08/22/2018  . GERD (gastroesophageal reflux disease)   . Hypertension   . Pulmonary emboli (HCC)   . Renal insufficiency    only has 1 kidney  . Sleep apnea   . Stroke (HCC)    Rt side weakness     Transfusion: None.   Consultants (if any):   Discharged Condition: Improved  Hospital Course: ARNETT DUDDY is an 86 y.o. male who was admitted 05/14/2020 with a diagnosis of a deep abscess of the right thigh and went to the operating room on 05/14/2020 and underwent the above named procedures.    Surgeries: Procedure(s): IRRIGATION AND DEBRIDEMENT OF RIGHT THIGH ABCESS on 05/14/2020 Patient tolerated the surgery well. Taken to PACU where she was stabilized and then transferred to the orthopedic floor.  Started on Lovenox 40mg  q 24 hrs. Foot pumps applied bilaterally at 80 mm. Heels elevated on bed with rolled towels. No evidence of DVT. Negative Homan.  On POD1, patient developed hyperkalemia, K+ 5.5, treated with kayexalate.  Hyperkalemia resolved.  Started with therapy on POD2, patient requires assistance at baseline, wife is caregiver.  Plan for discharge home today.  Culture from surgery returned growing rare MRSA, treated with IV Vancomycin while admitted.  Per ID will transition to oral Linezolid.  Will treat first oral dose this afternoon prior to discharge home.  Patient's IV was removed on POD3.  Hemovac was removed on POD2.  Implants: None.  He was given perioperative antibiotics:  Anti-infectives (From admission, onward)   Start  Dose/Rate Route Frequency Ordered Stop   05/17/20 1300  linezolid (ZYVOX) tablet 600 mg     Discontinue     600 mg Oral Every 12 hours 05/17/20 1155     05/17/20 0000  linezolid (ZYVOX) 600 MG tablet     Discontinue     600 mg Oral 2 times  daily 05/17/20 1036 05/31/20 2359   05/15/20 2200  vancomycin (VANCOREADY) IVPB 1250 mg/250 mL  Status:  Discontinued        1,250 mg 166.7 mL/hr over 90 Minutes Intravenous Every 24 hours 05/14/20 2201 05/17/20 1155   05/15/20 0600  ceFAZolin (ANCEF) IVPB 2g/100 mL premix        2 g 200 mL/hr over 30 Minutes Intravenous On call to O.R. 05/14/20 1332 05/14/20 1627   05/14/20 2200  vancomycin (VANCOCIN) IVPB 1000 mg/200 mL premix  Status:  Discontinued        1,000 mg 200 mL/hr over 60 Minutes Intravenous Every 12 hours 05/14/20 1949 05/14/20 2152   05/14/20 2200  vancomycin (VANCOREADY) IVPB 2000 mg/400 mL        2,000 mg 200 mL/hr over 120 Minutes Intravenous  Once 05/14/20 2201 05/15/20 0056   05/14/20 1643  vancomycin (VANCOCIN) powder  Status:  Discontinued          As needed 05/14/20 1706 05/14/20 1718   05/14/20 1600  vancomycin (VANCOCIN) IVPB 1000 mg/200 mL premix  Status:  Discontinued        1,000 mg 200 mL/hr over 60 Minutes Intravenous  Once 05/14/20 1555 05/14/20 2142   05/14/20 1413  ceFAZolin (ANCEF) 2-4 GM/100ML-% IVPB       Note to Pharmacy: Agnes Lawrence  : cabinet override      05/14/20 1413 05/14/20 1601    .  He was given sequential compression devices, early ambulation, and Lovenox for DVT prophylaxis.  He benefited maximally from the hospital stay and there were no complications.    Recent vital signs:  Vitals:   05/16/20 2331 05/17/20 0717  BP: (!) 187/68 (!) 188/72  Pulse: 70 83  Resp: 18 20  Temp: 97.8 F (36.6 C) 98.1 F (36.7 C)  SpO2: 95% 98%    Recent laboratory studies:  Lab Results  Component Value Date   HGB 10.9 (L) 05/17/2020   HGB 10.1 (L) 05/16/2020   HGB 11.7 (L) 05/15/2020   Lab Results  Component Value Date   WBC 7.1 05/17/2020   PLT 128 (L) 05/17/2020   Lab Results  Component Value Date   INR 1.0 11/17/2019   Lab Results  Component Value Date   NA 142 05/17/2020   K 4.6 05/17/2020   CL 109 05/17/2020   CO2 26  05/17/2020   BUN 22 05/17/2020   CREATININE 1.23 05/17/2020   GLUCOSE 81 05/17/2020   Discharge Medications:   Allergies as of 05/17/2020      Reactions   Butorphanol Anaphylaxis   Codeine Anaphylaxis   Morphine Anaphylaxis   Gabapentin Other (See Comments)   Unsure of what the reaction was   Lyrica [pregabalin] Rash   Tapentadol Nausea Only   ONSET 01/04/2013   Tramadol    Hallucinations "I see giant spiders on ceiling". Pt also believes he is in [facility], but he is in [place].      Medication List    STOP taking these medications   azithromycin 250 MG tablet Commonly known as: ZITHROMAX     TAKE these medications   acetaminophen 500  MG tablet Commonly known as: TYLENOL Take 1,000 mg by mouth every 6 (six) hours as needed for moderate pain or headache.   albuterol (2.5 MG/3ML) 0.083% nebulizer solution Commonly known as: PROVENTIL Take 3 mLs (2.5 mg total) by nebulization every 4 (four) hours as needed for wheezing or shortness of breath.   amLODipine 5 MG tablet Commonly known as: NORVASC Take 5 mg by mouth daily. What changed: Another medication with the same name was removed. Continue taking this medication, and follow the directions you see here.   atorvastatin 20 MG tablet Commonly known as: LIPITOR Take 1 tablet (20 mg total) by mouth at bedtime.   DESITIN EX Apply 1 application topically daily as needed (irritation).   donepezil 5 MG tablet Commonly known as: ARICEPT Take 5 mg by mouth at bedtime.   fluticasone 50 MCG/ACT nasal spray Commonly known as: FLONASE Place 2 sprays into both nostrils daily.   furosemide 40 MG tablet Commonly known as: LASIX Take 40 mg by mouth every Monday, Wednesday, and Friday.   guaiFENesin-dextromethorphan 100-10 MG/5ML syrup Commonly known as: ROBITUSSIN DM Take 5 mLs by mouth every 4 (four) hours as needed for cough.   linezolid 600 MG tablet Commonly known as: Zyvox Take 1 tablet (600 mg total) by mouth 2 (two) times  daily for 14 days.   metoprolol succinate 100 MG 24 hr tablet Commonly known as: TOPROL-XL Take 100 mg by mouth daily.   mirtazapine 15 MG tablet Commonly known as: REMERON Take 15 mg by mouth at bedtime.   multivitamin-lutein Caps capsule Take 1 capsule by mouth daily.   nystatin powder Commonly known as: MYCOSTATIN/NYSTOP Apply 1 application topically daily as needed (irritation).   omeprazole 40 MG capsule Commonly known as: PRILOSEC Take 1 capsule (40 mg total) by mouth daily.   oxyCODONE-acetaminophen 5-325 MG tablet Commonly known as: PERCOCET/ROXICET Take 1 tablet by mouth every 6 (six) hours as needed for severe pain.   sertraline 100 MG tablet Commonly known as: ZOLOFT Take 1 tablet (100 mg total) by mouth at bedtime.   tiotropium 18 MCG inhalation capsule Commonly known as: SPIRIVA Place 18 mcg into inhaler and inhale daily.   Vitamin D (Ergocalciferol) 1.25 MG (50000 UNIT) Caps capsule Commonly known as: DRISDOL Take 50,000 Units by mouth every Friday.      Diagnostic Studies: DG Chest 2 View  Result Date: 05/01/2020 CLINICAL DATA:  Hypoxia EXAM: CHEST - 2 VIEW COMPARISON:  12/29/2019 chest radiograph and CT chest. 01/31/2020 chest radiograph. FINDINGS: Hypoinflated lungs with chronic right hemidiaphragm elevation. Bibasilar opacities, likely atelectasis. Blunting of the right costophrenic sulcus likely reflects scarring versus trace pleural effusion. No pneumothorax. Partially obscured cardiomediastinal silhouette. Bilateral neck and medial right apex postsurgical sequela. Multilevel spondylosis. Left upper quadrant surgical clips. External radiopaque foreign body overlying the posterior body wall at the thoracolumbar junction. IMPRESSION: No acute airspace disease.  Bibasilar atelectasis. Chronic right hemidiaphragm elevation and right basilar scarring is unchanged. Electronically Signed   By: Stana Bunting M.D.   On: 05/01/2020 12:35   CT Angio Chest PE  W and/or Wo Contrast  Result Date: 05/01/2020 CLINICAL DATA:  Shortness of breath EXAM: CT ANGIOGRAPHY CHEST WITH CONTRAST TECHNIQUE: Multidetector CT imaging of the chest was performed using the standard protocol during bolus administration of intravenous contrast. Multiplanar CT image reconstructions and MIPs were obtained to evaluate the vascular anatomy. CONTRAST:  53mL OMNIPAQUE IOHEXOL 350 MG/ML SOLN COMPARISON:  CT chest 12/29/2019 FINDINGS: Cardiovascular: Satisfactory opacification of the pulmonary  arteries to the segmental level. No evidence of pulmonary embolism. Dilated main pulmonary artery reflecting pulmonary arterial hypertension. Normal heart size. No pericardial effusion. Coronary artery and thoracic aortic calcification. Mediastinum/Nodes: No new enlarged lymph nodes. Stable appearance of thyroid. Lungs/Pleura: Persistent elevation of the right hemidiaphragm. Increased right basilar patchy density. Persistent patchy left lower lobe ground-glass density. No pleural effusion or pneumothorax. Upper Abdomen: No acute abnormality. Multiple hepatic cysts again seen. Surgical clips at the expected location of the left adrenal. Partially imaged chronic severe left hydronephrosis with cortical thinning. Musculoskeletal: No acute osseous abnormality. Review of the MIP images confirms the above findings. IMPRESSION: No evidence of acute pulmonary embolism. Persistent elevation of the right hemidiaphragm. Increased density at the lung bases favored to reflect atelectasis. Persistent patchy ground-glass density in the superior segment of the left lower lobe. Electronically Signed   By: Guadlupe Spanish M.D.   On: 05/01/2020 16:41   CT FEMUR RIGHT WO CONTRAST  Result Date: 05/10/2020 CLINICAL DATA:  Right femur pain. Right hip and knee arthroplasties with history of periprosthetic fracture. EXAM: CT OF THE LOWER RIGHT EXTREMITY WITHOUT CONTRAST TECHNIQUE: Multidetector CT imaging of the right lower  extremity was performed according to the standard protocol. COMPARISON:  08/23/2018 x-ray FINDINGS: Bones/Joint/Cartilage Patient is status post right total hip arthroplasty. Arthroplasty components appear intact and appropriately aligned. No evidence of periprosthetic fracture related to the femoral or acetabular components of the right hip prosthesis. No periprosthetic lucency to suggest hardware loosening. Posttraumatic changes related to a well-healed mid femoral diaphyseal fracture with robust callus formation. Status post right total knee arthroplasty. Arthroplasty components are in their expected alignment without dislocation. There is a nondisplaced obliquely oriented periprosthetic fracture extending obliquely along the lateral cortex of the distal femur from the level of the femoral component of the knee hardware (series 4, images 300-315; series 8, image 56). No evidence of periprosthetic lucency to suggest hardware loosening. Multiple ghost screw tracks of the distal femur related to prior hardware removal. There is a 6.0 x 0.6 cm long osseous fragment along the anterolateral aspect of the mid to distal femoral diaphysis with a surrounding fluid collection measuring approximately 8.4 x 1.7 x 3.6 cm (series 7, image 84; series 5, image 232). This fragment was present on prior exams from 2019. Ligaments Suboptimally assessed by CT. Muscles and Tendons Muscle atrophy within the anterior compartment of the thigh. Soft tissues Nonspecific soft tissue edema over the lateral aspect of the thigh. Prominent vascular calcifications. IMPRESSION: 1. Nondisplaced obliquely oriented periprosthetic fracture of the lateral aspect of the distal right femoral metaphysis from the level of the femoral component of the knee hardware. Fracture line appears acute to subacute. 2. Status post right total hip arthroplasty without evidence of periprosthetic fracture or hardware loosening. 3. Chronic posttraumatic changes of the  mid femoral diaphyseal fracture. 4. There is a 6.0 x 1.7 x 3.6 cm osseous fragment along the anterolateral aspect of the mid to distal femoral diaphysis with a surrounding fluid collection measuring approximately 8.4 x 1.7 x 3.6 cm. This fragment was present on prior exams from 2019. Fluid collection may reflect sequela of postoperative seroma or possibly an adventitial bursa. These results will be called to the ordering clinician or representative by the Radiologist Assistant, and communication documented in the PACS or [employer]. Electronically Signed   By: Duanne Guess D.O.   On: 05/10/2020 16:50    Disposition:  There are no questions and answers to display.  Follow-up Information    Anson OregonMcGhee, Chijioke Lasser Lance, PA-C. Call.   Specialty: Physician Assistant Why: Call office on Monday, 05/20/20, to schedule an appointment for a skin check. Contact information: [address] [facility] [place] [id number] [phone]              Signed: Meriel PicaJames L Benigna Delisi PA-C 05/17/2020, 12:24 PM

## 2020-05-17 NOTE — Progress Notes (Signed)
ID Pt ahs already been discharged So did not talk to him Pharmacist spoke to him and his wife about linezolid and side effects Pt with h/o MRSA rt thigh infection due to shard of bone floating after a ORIF hard ware removal in Jan 2020. Discussed with Dr.Poggi yesterday who did not find the underlying femur to be involved  or underlying hardware of the knee or hip involved . Hence we decided to treat him with oral antibiotics for 2 weeks  He will take linezolid 600mg  PO Q 12 for 2 weeks I will follow him as OP in 1 week and get labs cbc/CMP

## 2020-05-17 NOTE — Progress Notes (Signed)
Pharmacy - Antimicrobial Stewardship (Medication procurement and cost)  Plan for patient to be sent out on linezolid per ID recommendation.  [facility] [address] [facility].  Cost per [facility] is $3.76 for 14-day supply.  They have in-stock.    Reviewed directions for how to take medication and possible side effects with patient (hand-out provided)  Juliette Alcide, PharmD, BCPS.   Work Cell: [phone] 05/17/2020 11:26 AM

## 2020-05-19 LAB — AEROBIC/ANAEROBIC CULTURE W GRAM STAIN (SURGICAL/DEEP WOUND)

## 2020-05-24 ENCOUNTER — Telehealth: Payer: Self-pay | Admitting: Infectious Diseases

## 2020-05-24 NOTE — Telephone Encounter (Signed)
Called him, spoke to wife to find how he is doing- pt on linezolid for MRSA infection rt thigh. I realize he also takes SSRI  HE had seen Dr.Sparks yesterday because of weakness and Dr.Sparks did labs. I went to Dr.Sparks office to get the labs and I spoke to him His vitals yesterday were normal  Rt thigh wound healing well- as Per Dr.Sparks, soft no eryhtema or swelling WBC9.5, HB 12.4, PLT 161, Na 140, cr 1.2, K 4.5, Co2 -31.9, AST 31, ALT 15  No evidence of marrow suppression, lactic acidosis or serotonin syndrome PT will complete the medication Discussed with wife

## 2020-05-27 ENCOUNTER — Other Ambulatory Visit: Payer: Self-pay

## 2020-05-27 ENCOUNTER — Emergency Department: Payer: Medicare Other

## 2020-05-27 DIAGNOSIS — Z87891 Personal history of nicotine dependence: Secondary | ICD-10-CM

## 2020-05-27 DIAGNOSIS — I69351 Hemiplegia and hemiparesis following cerebral infarction affecting right dominant side: Secondary | ICD-10-CM

## 2020-05-27 DIAGNOSIS — K219 Gastro-esophageal reflux disease without esophagitis: Secondary | ICD-10-CM | POA: Diagnosis present

## 2020-05-27 DIAGNOSIS — J440 Chronic obstructive pulmonary disease with acute lower respiratory infection: Secondary | ICD-10-CM | POA: Diagnosis present

## 2020-05-27 DIAGNOSIS — G4733 Obstructive sleep apnea (adult) (pediatric): Secondary | ICD-10-CM | POA: Diagnosis present

## 2020-05-27 DIAGNOSIS — I129 Hypertensive chronic kidney disease with stage 1 through stage 4 chronic kidney disease, or unspecified chronic kidney disease: Secondary | ICD-10-CM | POA: Diagnosis present

## 2020-05-27 DIAGNOSIS — E785 Hyperlipidemia, unspecified: Secondary | ICD-10-CM | POA: Diagnosis present

## 2020-05-27 DIAGNOSIS — J189 Pneumonia, unspecified organism: Principal | ICD-10-CM | POA: Diagnosis present

## 2020-05-27 DIAGNOSIS — I248 Other forms of acute ischemic heart disease: Secondary | ICD-10-CM | POA: Diagnosis present

## 2020-05-27 DIAGNOSIS — Z8614 Personal history of Methicillin resistant Staphylococcus aureus infection: Secondary | ICD-10-CM

## 2020-05-27 DIAGNOSIS — E1122 Type 2 diabetes mellitus with diabetic chronic kidney disease: Secondary | ICD-10-CM | POA: Diagnosis present

## 2020-05-27 DIAGNOSIS — Z66 Do not resuscitate: Secondary | ICD-10-CM | POA: Diagnosis present

## 2020-05-27 DIAGNOSIS — F329 Major depressive disorder, single episode, unspecified: Secondary | ICD-10-CM | POA: Diagnosis present

## 2020-05-27 DIAGNOSIS — Z515 Encounter for palliative care: Secondary | ICD-10-CM | POA: Diagnosis not present

## 2020-05-27 DIAGNOSIS — G459 Transient cerebral ischemic attack, unspecified: Secondary | ICD-10-CM | POA: Diagnosis not present

## 2020-05-27 DIAGNOSIS — Z96651 Presence of right artificial knee joint: Secondary | ICD-10-CM | POA: Diagnosis present

## 2020-05-27 DIAGNOSIS — Z79899 Other long term (current) drug therapy: Secondary | ICD-10-CM

## 2020-05-27 DIAGNOSIS — Z9842 Cataract extraction status, left eye: Secondary | ICD-10-CM

## 2020-05-27 DIAGNOSIS — Z885 Allergy status to narcotic agent status: Secondary | ICD-10-CM

## 2020-05-27 DIAGNOSIS — Z823 Family history of stroke: Secondary | ICD-10-CM

## 2020-05-27 DIAGNOSIS — Z888 Allergy status to other drugs, medicaments and biological substances status: Secondary | ICD-10-CM

## 2020-05-27 DIAGNOSIS — Z86711 Personal history of pulmonary embolism: Secondary | ICD-10-CM

## 2020-05-27 DIAGNOSIS — D696 Thrombocytopenia, unspecified: Secondary | ICD-10-CM | POA: Diagnosis present

## 2020-05-27 DIAGNOSIS — Z9841 Cataract extraction status, right eye: Secondary | ICD-10-CM

## 2020-05-27 DIAGNOSIS — N183 Chronic kidney disease, stage 3 unspecified: Secondary | ICD-10-CM | POA: Diagnosis present

## 2020-05-27 DIAGNOSIS — Z961 Presence of intraocular lens: Secondary | ICD-10-CM | POA: Diagnosis present

## 2020-05-27 DIAGNOSIS — I69328 Other speech and language deficits following cerebral infarction: Secondary | ICD-10-CM

## 2020-05-27 DIAGNOSIS — Z20822 Contact with and (suspected) exposure to covid-19: Secondary | ICD-10-CM | POA: Diagnosis present

## 2020-05-27 DIAGNOSIS — Z8701 Personal history of pneumonia (recurrent): Secondary | ICD-10-CM

## 2020-05-27 DIAGNOSIS — Y95 Nosocomial condition: Secondary | ICD-10-CM | POA: Diagnosis present

## 2020-05-27 DIAGNOSIS — Z8249 Family history of ischemic heart disease and other diseases of the circulatory system: Secondary | ICD-10-CM

## 2020-05-27 DIAGNOSIS — F0390 Unspecified dementia without behavioral disturbance: Secondary | ICD-10-CM | POA: Diagnosis present

## 2020-05-27 DIAGNOSIS — Z96641 Presence of right artificial hip joint: Secondary | ICD-10-CM | POA: Diagnosis present

## 2020-05-27 DIAGNOSIS — G9341 Metabolic encephalopathy: Secondary | ICD-10-CM | POA: Diagnosis present

## 2020-05-27 DIAGNOSIS — J9601 Acute respiratory failure with hypoxia: Secondary | ICD-10-CM | POA: Diagnosis present

## 2020-05-27 LAB — COMPREHENSIVE METABOLIC PANEL
ALT: 25 U/L (ref 0–44)
AST: 29 U/L (ref 15–41)
Albumin: 3.1 g/dL — ABNORMAL LOW (ref 3.5–5.0)
Alkaline Phosphatase: 74 U/L (ref 38–126)
Anion gap: 11 (ref 5–15)
BUN: 24 mg/dL — ABNORMAL HIGH (ref 8–23)
CO2: 28 mmol/L (ref 22–32)
Calcium: 9.1 mg/dL (ref 8.9–10.3)
Chloride: 103 mmol/L (ref 98–111)
Creatinine, Ser: 1.1 mg/dL (ref 0.61–1.24)
GFR calc Af Amer: >60 mL/min (ref >60)
GFR calc non Af Amer: >60 mL/min (ref >60)
Glucose, Bld: 139 mg/dL — ABNORMAL HIGH (ref 70–99)
Potassium: 4.2 mmol/L (ref 3.5–5.1)
Sodium: 142 mmol/L (ref 135–145)
Total Bilirubin: 0.9 mg/dL (ref 0.3–1.2)
Total Protein: 6.5 g/dL (ref 6.5–8.1)

## 2020-05-27 LAB — CBC
HCT: 39.7 % (ref 39.0–52.0)
Hemoglobin: 12.6 g/dL — ABNORMAL LOW (ref 13.0–17.0)
MCH: 29 pg (ref 26.0–34.0)
MCHC: 31.7 g/dL (ref 30.0–36.0)
MCV: 91.5 fL (ref 80.0–100.0)
Platelets: 127 10*3/uL — ABNORMAL LOW (ref 150–400)
RBC: 4.34 MIL/uL (ref 4.22–5.81)
RDW: 13.7 % (ref 11.5–15.5)
WBC: 11.1 10*3/uL — ABNORMAL HIGH (ref 4.0–10.5)
nRBC: 0 % (ref 0.0–0.2)

## 2020-05-27 LAB — URINALYSIS, COMPLETE (UACMP) WITH MICROSCOPIC
Bacteria, UA: NONE SEEN
Bilirubin Urine: NEGATIVE
Glucose, UA: NEGATIVE mg/dL
Ketones, ur: NEGATIVE mg/dL
Leukocytes,Ua: NEGATIVE
Nitrite: NEGATIVE
Protein, ur: 100 mg/dL — AB
Specific Gravity, Urine: 1.017 (ref 1.005–1.030)
Squamous Epithelial / HPF: NONE SEEN (ref 0–5)
WBC, UA: NONE SEEN WBC/hpf (ref 0–5)
pH: 5 (ref 5.0–8.0)

## 2020-05-27 NOTE — ED Triage Notes (Signed)
See first nurse note. Pt is a poor historian stating he does not know why he is here. This RN called pt's wife to get more information and she states pt started having slurred speech, difficulty moving hands to his mouth and face and acting "incoherent". Pt is alert but not oriented at this time and speech is slurred. Pt face is symmetrical, hand grips even and present. Pt in NAD, skin warm and dry

## 2020-05-27 NOTE — ED Triage Notes (Signed)
First Nurse Note:  Pt in via [facility] from home; per EMS report, pt with episode of slurred speech and altered mental status on Saturday.  Advised per PCP to be evaluated in ED for possible stroke.    Pt on 2L nasal cannula upon arrival; this is baseline oxygen therapy.  CBG 134.

## 2020-05-28 ENCOUNTER — Inpatient Hospital Stay: Admit: 2020-05-28 | Payer: Medicare Other

## 2020-05-28 ENCOUNTER — Emergency Department: Payer: Medicare Other

## 2020-05-28 ENCOUNTER — Inpatient Hospital Stay
Admission: EM | Admit: 2020-05-28 | Discharge: 2020-06-12 | DRG: 193 | Disposition: E | Payer: Medicare Other | Attending: Internal Medicine | Admitting: Internal Medicine

## 2020-05-28 ENCOUNTER — Inpatient Hospital Stay (HOSPITAL_COMMUNITY)
Admit: 2020-05-28 | Discharge: 2020-05-28 | Disposition: A | Discharge status: Home / Self Care | Payer: Medicare Other | Attending: Family Medicine | Admitting: Family Medicine

## 2020-05-28 ENCOUNTER — Inpatient Hospital Stay: Payer: Medicare Other

## 2020-05-28 DIAGNOSIS — J189 Pneumonia, unspecified organism: Secondary | ICD-10-CM | POA: Diagnosis present

## 2020-05-28 DIAGNOSIS — R410 Disorientation, unspecified: Secondary | ICD-10-CM

## 2020-05-28 DIAGNOSIS — I129 Hypertensive chronic kidney disease with stage 1 through stage 4 chronic kidney disease, or unspecified chronic kidney disease: Secondary | ICD-10-CM | POA: Diagnosis present

## 2020-05-28 DIAGNOSIS — E785 Hyperlipidemia, unspecified: Secondary | ICD-10-CM

## 2020-05-28 DIAGNOSIS — R4781 Slurred speech: Secondary | ICD-10-CM

## 2020-05-28 DIAGNOSIS — I69351 Hemiplegia and hemiparesis following cerebral infarction affecting right dominant side: Secondary | ICD-10-CM | POA: Diagnosis not present

## 2020-05-28 DIAGNOSIS — T43205A Adverse effect of unspecified antidepressants, initial encounter: Secondary | ICD-10-CM | POA: Diagnosis not present

## 2020-05-28 DIAGNOSIS — Z87891 Personal history of nicotine dependence: Secondary | ICD-10-CM | POA: Diagnosis not present

## 2020-05-28 DIAGNOSIS — G9340 Encephalopathy, unspecified: Secondary | ICD-10-CM

## 2020-05-28 DIAGNOSIS — J440 Chronic obstructive pulmonary disease with acute lower respiratory infection: Secondary | ICD-10-CM | POA: Diagnosis present

## 2020-05-28 DIAGNOSIS — I10 Essential (primary) hypertension: Secondary | ICD-10-CM

## 2020-05-28 DIAGNOSIS — Z8614 Personal history of Methicillin resistant Staphylococcus aureus infection: Secondary | ICD-10-CM | POA: Diagnosis not present

## 2020-05-28 DIAGNOSIS — Z96651 Presence of right artificial knee joint: Secondary | ICD-10-CM | POA: Diagnosis present

## 2020-05-28 DIAGNOSIS — Z66 Do not resuscitate: Secondary | ICD-10-CM | POA: Diagnosis present

## 2020-05-28 DIAGNOSIS — Z20822 Contact with and (suspected) exposure to covid-19: Secondary | ICD-10-CM | POA: Diagnosis present

## 2020-05-28 DIAGNOSIS — G459 Transient cerebral ischemic attack, unspecified: Secondary | ICD-10-CM | POA: Diagnosis present

## 2020-05-28 DIAGNOSIS — F329 Major depressive disorder, single episode, unspecified: Secondary | ICD-10-CM | POA: Diagnosis present

## 2020-05-28 DIAGNOSIS — Z515 Encounter for palliative care: Secondary | ICD-10-CM | POA: Diagnosis not present

## 2020-05-28 DIAGNOSIS — D696 Thrombocytopenia, unspecified: Secondary | ICD-10-CM

## 2020-05-28 DIAGNOSIS — R531 Weakness: Secondary | ICD-10-CM | POA: Diagnosis not present

## 2020-05-28 DIAGNOSIS — G4734 Idiopathic sleep related nonobstructive alveolar hypoventilation: Secondary | ICD-10-CM | POA: Diagnosis not present

## 2020-05-28 DIAGNOSIS — N183 Chronic kidney disease, stage 3 unspecified: Secondary | ICD-10-CM | POA: Diagnosis present

## 2020-05-28 DIAGNOSIS — Z9842 Cataract extraction status, left eye: Secondary | ICD-10-CM | POA: Diagnosis not present

## 2020-05-28 DIAGNOSIS — G4733 Obstructive sleep apnea (adult) (pediatric): Secondary | ICD-10-CM | POA: Diagnosis present

## 2020-05-28 DIAGNOSIS — Z961 Presence of intraocular lens: Secondary | ICD-10-CM | POA: Diagnosis present

## 2020-05-28 DIAGNOSIS — A4902 Methicillin resistant Staphylococcus aureus infection, unspecified site: Secondary | ICD-10-CM

## 2020-05-28 DIAGNOSIS — Z9841 Cataract extraction status, right eye: Secondary | ICD-10-CM | POA: Diagnosis not present

## 2020-05-28 DIAGNOSIS — D72829 Elevated white blood cell count, unspecified: Secondary | ICD-10-CM

## 2020-05-28 DIAGNOSIS — E1122 Type 2 diabetes mellitus with diabetic chronic kidney disease: Secondary | ICD-10-CM | POA: Diagnosis present

## 2020-05-28 DIAGNOSIS — Y95 Nosocomial condition: Secondary | ICD-10-CM | POA: Diagnosis present

## 2020-05-28 DIAGNOSIS — I248 Other forms of acute ischemic heart disease: Secondary | ICD-10-CM

## 2020-05-28 DIAGNOSIS — R609 Edema, unspecified: Secondary | ICD-10-CM

## 2020-05-28 DIAGNOSIS — J9601 Acute respiratory failure with hypoxia: Secondary | ICD-10-CM

## 2020-05-28 DIAGNOSIS — I69328 Other speech and language deficits following cerebral infarction: Secondary | ICD-10-CM | POA: Diagnosis not present

## 2020-05-28 DIAGNOSIS — G9341 Metabolic encephalopathy: Secondary | ICD-10-CM

## 2020-05-28 DIAGNOSIS — Z96641 Presence of right artificial hip joint: Secondary | ICD-10-CM | POA: Diagnosis present

## 2020-05-28 DIAGNOSIS — Z8701 Personal history of pneumonia (recurrent): Secondary | ICD-10-CM | POA: Diagnosis not present

## 2020-05-28 LAB — SARS CORONAVIRUS 2 BY RT PCR (HOSPITAL ORDER, PERFORMED IN ~~LOC~~ HOSPITAL LAB): SARS Coronavirus 2: NEGATIVE

## 2020-05-28 LAB — ECHOCARDIOGRAM COMPLETE: S' Lateral: 2.1 cm

## 2020-05-28 LAB — STREP PNEUMONIAE URINARY ANTIGEN: Strep Pneumo Urinary Antigen: NEGATIVE

## 2020-05-28 LAB — PROCALCITONIN: Procalcitonin: <0.1 ng/mL

## 2020-05-28 LAB — TSH: TSH: 1.639 u[IU]/mL (ref 0.350–4.500)

## 2020-05-28 LAB — LACTIC ACID, PLASMA: Lactic Acid, Venous: 0.8 mmol/L (ref 0.5–1.9)

## 2020-05-28 LAB — TROPONIN I (HIGH SENSITIVITY): Troponin I (High Sensitivity): 23 ng/L — ABNORMAL HIGH (ref <18)

## 2020-05-28 MED ORDER — ONDANSETRON HCL 4 MG/2ML IJ SOLN: 4.0000 mg | Freq: Four times a day (QID) | INTRAMUSCULAR | Status: DC | PRN

## 2020-05-28 MED ORDER — MEGESTROL ACETATE 400 MG/10ML PO SUSP
400.0000 mg | Freq: Every day | ORAL | Status: DC
  Administered 2020-05-30 – 2020-05-31 (×2): 400 mg via ORAL
  Filled 2020-05-28 (×6): qty 10

## 2020-05-28 MED ORDER — ATORVASTATIN CALCIUM 20 MG PO TABS
20.0000 mg | ORAL_TABLET | Freq: Every day | ORAL | Status: DC
  Administered 2020-05-30: 18:00:00 20 mg via ORAL
  Filled 2020-05-28: qty 1

## 2020-05-28 MED ORDER — TIOTROPIUM BROMIDE MONOHYDRATE 18 MCG IN CAPS
18.0000 ug | ORAL_CAPSULE | Freq: Every day | RESPIRATORY_TRACT | Status: DC
  Administered 2020-06-01 – 2020-06-02 (×2): 18 ug via RESPIRATORY_TRACT
  Filled 2020-05-28 (×2): qty 5

## 2020-05-28 MED ORDER — TRAZODONE HCL 50 MG PO TABS: 25.0000 mg | ORAL_TABLET | Freq: Every evening | ORAL | Status: DC | PRN

## 2020-05-28 MED ORDER — ACETAMINOPHEN 650 MG RE SUPP: 650.0000 mg | Freq: Four times a day (QID) | RECTAL | Status: DC | PRN

## 2020-05-28 MED ORDER — LINEZOLID 600 MG PO TABS
600.0000 mg | ORAL_TABLET | Freq: Two times a day (BID) | ORAL | Status: DC
  Administered 2020-05-29 – 2020-05-30 (×3): 600 mg via ORAL
  Filled 2020-05-28 (×7): qty 1

## 2020-05-28 MED ORDER — MAGNESIUM HYDROXIDE 400 MG/5ML PO SUSP
30.0000 mL | Freq: Every day | ORAL | Status: DC | PRN
  Filled 2020-05-28: qty 30

## 2020-05-28 MED ORDER — ENOXAPARIN SODIUM 40 MG/0.4ML ~~LOC~~ SOLN
40.0000 mg | SUBCUTANEOUS | Status: DC
  Administered 2020-05-28 – 2020-05-30 (×3): 40 mg via SUBCUTANEOUS
  Filled 2020-05-28 (×3): qty 0.4

## 2020-05-28 MED ORDER — VITAMIN D (ERGOCALCIFEROL) 1.25 MG (50000 UNIT) PO CAPS
50000.0000 [IU] | ORAL_CAPSULE | ORAL | Status: DC
  Filled 2020-05-28: qty 1

## 2020-05-28 MED ORDER — MIRTAZAPINE 15 MG PO TABS
15.0000 mg | ORAL_TABLET | Freq: Every day | ORAL | Status: DC
  Administered 2020-05-29 – 2020-05-30 (×2): 15 mg via ORAL
  Filled 2020-05-28 (×2): qty 1

## 2020-05-28 MED ORDER — AMLODIPINE BESYLATE 5 MG PO TABS: 5.0000 mg | ORAL_TABLET | Freq: Every day | ORAL | Status: DC

## 2020-05-28 MED ORDER — VANCOMYCIN HCL IN DEXTROSE 1-5 GM/200ML-% IV SOLN
1000.0000 mg | Freq: Once | INTRAVENOUS | Status: AC
  Administered 2020-05-28: 1000 mg via INTRAVENOUS
  Filled 2020-05-28: qty 200

## 2020-05-28 MED ORDER — SODIUM CHLORIDE 0.9 % IV SOLN
1.0000 g | Freq: Once | INTRAVENOUS | Status: AC
  Administered 2020-05-28: 1 g via INTRAVENOUS
  Filled 2020-05-28: qty 1

## 2020-05-28 MED ORDER — STROKE: EARLY STAGES OF RECOVERY BOOK: Freq: Once | Status: DC

## 2020-05-28 MED ORDER — FLUTICASONE PROPIONATE 50 MCG/ACT NA SUSP
2.0000 | Freq: Every day | NASAL | Status: DC
  Administered 2020-06-01 – 2020-06-02 (×2): 2 via NASAL
  Filled 2020-05-28 (×2): qty 16

## 2020-05-28 MED ORDER — SODIUM CHLORIDE 0.9 % IV SOLN
500.0000 mg | INTRAVENOUS | Status: DC
  Administered 2020-05-28 – 2020-05-29 (×2): 500 mg via INTRAVENOUS
  Filled 2020-05-28 (×2): qty 500

## 2020-05-28 MED ORDER — FUROSEMIDE 40 MG PO TABS
40.0000 mg | ORAL_TABLET | ORAL | Status: DC
  Administered 2020-05-31: 40 mg via ORAL
  Filled 2020-05-28 (×2): qty 1

## 2020-05-28 MED ORDER — SODIUM CHLORIDE 0.9 % IV SOLN
2.0000 g | INTRAVENOUS | Status: DC
  Administered 2020-05-28 – 2020-05-29 (×2): 2 g via INTRAVENOUS
  Filled 2020-05-28 (×2): qty 20

## 2020-05-28 MED ORDER — OCUVITE-LUTEIN PO CAPS
1.0000 | ORAL_CAPSULE | Freq: Every day | ORAL | Status: DC
  Administered 2020-05-30 – 2020-06-01 (×3): 1 via ORAL
  Filled 2020-05-28 (×5): qty 1

## 2020-05-28 MED ORDER — DONEPEZIL HCL 5 MG PO TABS
5.0000 mg | ORAL_TABLET | Freq: Every day | ORAL | Status: DC
  Administered 2020-05-29 – 2020-05-31 (×3): 5 mg via ORAL
  Filled 2020-05-28 (×3): qty 1

## 2020-05-28 MED ORDER — SODIUM CHLORIDE 0.9 % IV SOLN: INTRAVENOUS | Status: DC

## 2020-05-28 MED ORDER — LACTATED RINGERS IV BOLUS
1000.0000 mL | Freq: Once | INTRAVENOUS | Status: AC
  Administered 2020-05-28: 1000 mL via INTRAVENOUS

## 2020-05-28 MED ORDER — SERTRALINE HCL 50 MG PO TABS
100.0000 mg | ORAL_TABLET | Freq: Every day | ORAL | Status: DC
  Administered 2020-05-29 – 2020-05-30 (×2): 100 mg via ORAL
  Filled 2020-05-28 (×2): qty 2

## 2020-05-28 MED ORDER — METOPROLOL SUCCINATE ER 50 MG PO TB24
100.0000 mg | ORAL_TABLET | Freq: Every day | ORAL | Status: DC
  Administered 2020-05-30 – 2020-06-01 (×3): 100 mg via ORAL
  Filled 2020-05-28 (×3): qty 2

## 2020-05-28 MED ORDER — ONDANSETRON HCL 4 MG PO TABS: 4.0000 mg | ORAL_TABLET | Freq: Four times a day (QID) | ORAL | Status: DC | PRN

## 2020-05-28 MED ORDER — ACETAMINOPHEN 325 MG PO TABS: 650.0000 mg | ORAL_TABLET | Freq: Four times a day (QID) | ORAL | Status: DC | PRN

## 2020-05-28 MED ORDER — LABETALOL HCL 5 MG/ML IV SOLN
20.0000 mg | INTRAVENOUS | Status: DC | PRN
  Administered 2020-05-28 – 2020-05-29 (×5): 20 mg via INTRAVENOUS
  Filled 2020-05-28 (×6): qty 4

## 2020-05-28 NOTE — ED Notes (Signed)
Pt sleeping at this time, bed in lowest position with call bell and sitter at bedside.

## 2020-05-28 NOTE — ED Notes (Addendum)
This RN rounded on pt at this time, Pt trying to get out of bed stating he has to brush his teeth and go to work. This RN tried to redirect pt back in the bed at this time. Pt still trying to get out of bed. Charge notified a sitter is needed at this time. Pt states, "the hell I can't, I'm getting up."

## 2020-05-28 NOTE — Progress Notes (Signed)
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  His wife as well as cough with associated mild dyspnea.  He is postop day 14 after an I&D of deep right thigh abscess by Dr. Joice Lofts.  He has been declining over the last week with associated altered mental status with confusion.  He has been on Zyvox orally for his infection.  No reported fever or chills per his wife.  No paresthesias or worsening muscle weakness.  No nausea vomiting or diarrhea.  No chest pain or palpitations.  No reported dysuria, oliguria or hematuria or flank pain.  In the ED he was found to have bibasilar pneumonia, likely HCAP from his recent hospitalization. CT head and MRI brain were negative for acute infarct. He is being roomed in the ED pending placement in a bed upstairs.  The patient is resting comfortably with his wife at bedside. No new complaints. He is awake, alert, and oriented x 3. No acute distress. Heart sounds are within normal limits. There are rales at the bases of his lungs bilaterally. Abdomen is soft, non-tender, non-distended. No cyanosis, clubbing or edema of lower extremities.  He is receiving IV Rocephin and Zyvox.   The patient was admitted earlier today by my colleague, Dr. Arville Care.

## 2020-05-28 NOTE — Progress Notes (Signed)
*  PRELIMINARY RESULTS* Echocardiogram 2D Echocardiogram has been performed.  Cristela Blue 05/27/2020, 2:03 PM

## 2020-05-28 NOTE — ED Notes (Signed)
IV team at bedside to attempt more access

## 2020-05-28 NOTE — Evaluation (Signed)
Occupational Therapy Evaluation Patient Details Name: Gary Thomas MRN: [id number] DOB: August 23, 1932 Today's Date: 05/23/2020    History of Present Illness Pt is an 86 y/o M with PMH including: anxiety, depression, DM, HTN, OSA, CVA with residual R side weakness and slurred speech. Pt is 14 days post op I&D of deep right thigh abscess by Dr. Joice Lofts. Pt presented with cough and dyspnea. ED w/u included WBC elevated and chest x-ray showed some increasing medial basilar opacities bilaterally. Pt adm for bi-basilar PNA.   Clinical Impression   Pt was seen for OT evaluation this date. Prior to hospital admission, pt was requiring some assist for bathing, LBDsg, and was MOD I for ADL transfers using lateral scoot with slideboard. Pt lives with spouse who has had back sx and is largely unable to physically assist pt. Currently pt demonstrates impairments as described below (See OT problem list) which functionally limit his ability to perform ADL/self-care tasks. Pt currently requires MAX +2 for sup to long sitting in bed, MOD A for hand to mouth at bed level with HOB elevated, and MAX to TOTAL A with LB ADLs bed level. Pt fatigued and further mobility assessment unable to be completed at this time.  Pt would benefit from skilled OT to address noted impairments and functional limitations (see below for any additional details) in order to maximize safety and independence while minimizing falls risk and caregiver burden. Upon hospital discharge, recommend STR to maximize pt safety and return to PLOF.     Follow Up Recommendations  SNF    Equipment Recommendations  Other (comment) (defer to next level of care)    Recommendations for Other Services       Precautions / Restrictions Precautions Precautions: Fall Restrictions Other Position/Activity Restrictions: WBAT RLE after I&D per PT eval from 05/16/20      Mobility Bed Mobility Overal bed mobility: Needs Assistance Bed Mobility:  Rolling Rolling: Mod assist         General bed mobility comments: Attempted to have pt participate in coming to long sitting while in bed so that old sheets could be removed from behin pt's back. Pt requires MAX +2 to come to long sitting and only tolerates ~x15 seconds  Transfers                 General transfer comment: not attempted this date. Pt reports being too tired after 3 person transfer from stretcher to hospital bed, long sitting for sheet removal, and oral care.    Balance       Sitting balance - Comments: NT       Standing balance comment: NT                           ADL either performed or assessed with clinical judgement   ADL Overall ADL's : Needs assistance/impaired                                       General ADL Comments: MOD A with bed level UB ADLs including oral care, MAX to TOTAL A with bed level LB ADLs     Vision Patient Visual Report: No change from baseline       Perception     Praxis      Pertinent Vitals/Pain Pain Assessment: Faces Faces Pain Scale: Hurts a little bit Pain Location: R leg  Pain Descriptors / Indicators: Guarding;Sore Pain Intervention(s): Limited activity within patient's tolerance;Monitored during session;Repositioned     Hand Dominance Right   Extremity/Trunk Assessment Upper Extremity Assessment Upper Extremity Assessment: Generalized weakness (UEs tremulous with attempts to participate in hand to mouth with oral care. Pt unable to I'ly bring hand to mouth on R or L, requires more support on R.)   Lower Extremity Assessment Lower Extremity Assessment: Generalized weakness       Communication Communication Communication: No difficulties   Cognition Arousal/Alertness: Awake/alert Behavior During Therapy: WFL for tasks assessed/performed Overall Cognitive Status: No family/caregiver present to determine baseline cognitive functioning                                  General Comments: Pt oriented to place and self. Some confusion noted with description of PLOF. Pt reported a recent dementia dx on last hospitalization.   General Comments       Exercises Other Exercises Other Exercises: OT faciliates ed re: importance of OOB activity, importance of core strengthening to assist with more productive cough/increasd expulsion of secretions to limit recurring PNA. Pt with some ackowledgement, but poor carryover. Will continue to educate.   Shoulder Instructions      Home Living Family/patient expects to be discharged to:: Private residence Living Arrangements: Spouse/significant other Available Help at Discharge: Family Type of Home: House Home Access: Ramped entrance     Home Layout: One level     Bathroom Shower/Tub: Producer, television/film/video: Standard Bathroom Accessibility: Yes How Accessible: Accessible via walker Home Equipment: Walker - 2 wheels;Cane - single point;Shower seat;Bedside commode;Hospital bed;Wheelchair - IT trainer;Other (comment)   Additional Comments: trapeze above bed, multiple sliding boards      Prior Functioning/Environment Level of Independence: Needs assistance  Gait / Transfers Assistance Needed: Pt reports that he uses a homemade slide board and can get to/from wc/bed w/o assist. Pt recieved a medical grade sliding boardduring july hospitalization, but pt states he still uses his homemade one. Pt states wife is around 24/7 but cannot help much as, per pt, wife had spine surgery last year to correct scoliosis. ADL's / Homemaking Assistance Needed: Wife assists with bathing, pericare (wears depends), and LBD. Pt able to perform light meal prep. Pt reports that they have a girl come "sometimes" for cooking/cleaning IADLs.   Comments: Pt somewhat confused this date, reports walking around pretty often and easily himself, reports being able to dress and bathe himself although recent/previous  therapy evaluations indicate otherwise and pt's presentation would suggest he was not strong enough to do these activities I'ly in the interim between hospitalizations.        OT Problem List: Decreased strength;Decreased range of motion;Decreased activity tolerance;Impaired balance (sitting and/or standing);Decreased coordination;Decreased safety awareness      OT Treatment/Interventions: Self-care/ADL training;Therapeutic exercise;Energy conservation;DME and/or AE instruction;Therapeutic activities;Patient/family education;Balance training    OT Goals(Current goals can be found in the care plan section) Acute Rehab OT Goals Patient Stated Goal: get stronger OT Goal Formulation: With patient Time For Goal Achievement: 06/11/20 Potential to Achieve Goals: Good  OT Frequency: Min 1X/week   Barriers to D/C:            Co-evaluation PT/OT/SLP Co-Evaluation/Treatment: Yes Reason for Co-Treatment: Complexity of the patient's impairments (multi-system involvement);Necessary to address cognition/behavior during functional activity   OT goals addressed during session: ADL's and self-care SLP goals addressed during session: Swallowing;Communication  AM-PAC OT "6 Clicks" Daily Activity     Outcome Measure Help from another person eating meals?: A Lot Help from another person taking care of personal grooming?: A Lot Help from another person toileting, which includes using toliet, bedpan, or urinal?: A Lot Help from another person bathing (including washing, rinsing, drying)?: A Lot Help from another person to put on and taking off regular upper body clothing?: A Lot Help from another person to put on and taking off regular lower body clothing?: A Lot 6 Click Score: 12   End of Session Nurse Communication: Mobility status  Activity Tolerance: Patient limited by fatigue Patient left: in bed;with call bell/phone within reach;with bed alarm set  OT Visit Diagnosis: Unsteadiness on feet  (R26.81);Other abnormalities of gait and mobility (R26.89);Muscle weakness (generalized) (M62.81);History of falling (Z91.81)                Time: 4917-9150 OT Time Calculation (min): 38 min Charges:  OT General Charges $OT Visit: 1 Visit OT Evaluation $OT Eval Moderate Complexity: 1 Mod OT Treatments $Self Care/Home Management : 8-22 mins  Rejeana Brock, MS, OTR/L ascom [phone] 06-27-20, 10:29 AM

## 2020-05-28 NOTE — H&P (Signed)
[place]   PATIENT NAME: Gary Thomas    MR#:  [id number]  DATE OF BIRTH:  [birth year]  DATE OF ADMISSION:  05-29-20  PRIMARY CARE PHYSICIAN: Marguarite Arbour, MD   REQUESTING/REFERRING PHYSICIAN: Cecil Cobbs, MD  CHIEF COMPLAINT:   Chief Complaint  Patient presents with  . Altered Mental Status    HISTORY OF PRESENT ILLNESS:  Gary Thomas  is a 86 y.o. Caucasian male with a known history of type diabetes mellitus, depression, hypertension, obstructive sleep apnea, CVA with residual right-sided weakness and slurred speech, presented to the emergency room with likely worsening slurred speech.  His wife as well as cough with associated mild dyspnea.  He is postop day 14 after an I&D of deep right thigh abscess by Dr. Joice Lofts.  He has been declining over the last week with associated altered mental status with confusion.  He has been on Zyvox orally for his infection.  No reported fever or chills per his wife.  No paresthesias or worsening muscle weakness.  No nausea vomiting or diarrhea.  No chest pain or palpitations.  No reported dysuria, oliguria or hematuria or flank pain.  Upon presentation to the emergency room, blood pressure was 180/82 with otherwise normal vital signs.  Labs revealed Albumin of 3.1 with otherwise unremarkable CMP.  CBC showed WBC of 11.1 and UA was negative.  Portable chest x-ray showed some increasing medial basilar opacities bilaterally that may reflect worsening atelectasis or developing infection and chronic elevation of the right hemidiaphragm with some adjacent scarring and/or atelectasis. WBC of 11.1 EKG showed normal sinus rhythm with a rate of 85 with Q waves anteroseptally.  The patient was given IV cefepime and Comycin and 1 L bolus of IV lactated Ringer.  He will be admitted to a medically monitored bed for further evaluation and management PAST MEDICAL HISTORY:   Past Medical History:  Diagnosis Date  . Depression   . Diabetes  mellitus without complication (HCC)   . Dyspnea   . Elevated lipids   . Femur fracture (HCC) 08/22/2018  . GERD (gastroesophageal reflux disease)   . Hypertension   . Pulmonary emboli (HCC)   . Renal insufficiency    only has 1 kidney  . Sleep apnea   . Stroke (HCC)    Rt side weakness    PAST SURGICAL HISTORY:   Past Surgical History:  Procedure Laterality Date  . 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 AORTA SURGERY    . CAROTID ARTERY - SUBCLAVIAN ARTERY BYPASS GRAFT Bilateral   . CATARACT EXTRACTION W/ INTRAOCULAR LENS  IMPLANT, BILATERAL    . CHOLECYSTECTOMY    . EYE SURGERY    . FEMUR FRACTURE SURGERY Right   . HIP FRACTURE SURGERY Bilateral   . I & D EXTREMITY Right 10/18/2018   Procedure: IRRIGATION AND DEBRIDEMENT EXTREMITY, POSSIBLE HARDWARE REMOVAL OF SUPERCONDYLAR PLATE;  Surgeon: Christena Flake, MD;  Location: [facility];  Service: Orthopedics;  Laterality: Right;  . I & D EXTREMITY Right 05/14/2020   Procedure: IRRIGATION AND DEBRIDEMENT OF RIGHT THIGH ABCESS;  Surgeon: Christena Flake, MD;  Location: [facility];  Service: Orthopedics;  Laterality: Right;  . JOINT REPLACEMENT    . ORIF FEMUR FRACTURE Right 08/24/2018   Procedure: OPEN REDUCTION INTERNAL FIXATION (ORIF) SUPRACONDYLAR FEMUR FRACTURE;  Surgeon: Christena Flake, MD;  Location: [facility];  Service: Orthopedics;  Laterality: Right;  . ORIF FEMUR FRACTURE Right  10/18/2018   Procedure: HARDWARE REMOVAL- DISTAL FEMUR FRACTURE FIXATION;  Surgeon: Christena FlakePoggi, John J, MD;  Location: [facility];  Service: Orthopedics;  Laterality: Right;  . PROSTATE SURGERY    . REPLACEMENT TOTAL KNEE Right   . TOTAL HIP ARTHROPLASTY Right 03/02/2017   Procedure: TOTAL HIP ARTHROPLASTY ANTERIOR APPROACH;  Surgeon: Kennedy BuckerMenz, Michael, MD;  Location: [facility];  Service: Orthopedics;  Laterality: Right;  Marland Kitchen. VASCULAR SURGERY      SOCIAL HISTORY:   Social History   Tobacco Use  . Smoking status: Former Smoker     Packs/day: 1.00    Years: 7.00    Pack years: 7.00    Types: Cigarettes    Quit date: 02/18/1952    Years since quitting: 68.3  . Smokeless tobacco: Former Engineer, waterUser  Substance Use Topics  . Alcohol use: No    FAMILY HISTORY:   Family History  Problem Relation Age of Onset  . Heart attack Father   . Stroke Father   . Heart disease Father   . Heart attack Mother     DRUG ALLERGIES:   Allergies  Allergen Reactions  . Butorphanol Anaphylaxis  . Codeine Anaphylaxis  . Morphine Anaphylaxis  . Gabapentin Other (See Comments)    Unsure of what the reaction was   . Lyrica [Pregabalin] Rash  . Tapentadol Nausea Only    ONSET 01/04/2013  . Tramadol     Hallucinations "I see giant spiders on ceiling". [place] [facility] [place], but he [place].    REVIEW OF SYSTEMS:   ROS As per history of present illness. All pertinent systems were reviewed above. Constitutional, HEENT, cardiovascular, respiratory, GI, GU, musculoskeletal, neuro, psychiatric, endocrine, integumentary and hematologic systems were reviewed and are otherwise negative/unremarkable except for positive findings mentioned above in the HPI.   MEDICATIONS AT HOME:   Prior to Admission medications   Medication Sig Start Date End Date Taking? Authorizing Provider  amLODipine (NORVASC) 5 MG tablet Take 5 mg by mouth daily.   Yes [provider]  fluticasone (FLONASE) 50 MCG/ACT nasal spray Place 2 sprays into both nostrils daily. 05/04/20  Yes Alford HighlandWieting, Richard, MD  furosemide (LASIX) 40 MG tablet Take 40 mg by mouth every Monday, Wednesday, and Friday.   Yes [provider]  linezolid (ZYVOX) 600 MG tablet Take 1 tablet (600 mg total) by mouth 2 (two) times daily for 14 days. 05/17/20 05/31/20 Yes Lynn Itoavishankar, Jayashree, MD  megestrol (MEGACE) 40 MG/ML suspension Take 10 mLs by mouth daily. 05/23/20  Yes [provider]  metoprolol succinate (TOPROL-XL) 100 MG 24 hr tablet Take 100 mg by mouth  daily. 03/12/20  Yes [provider]  multivitamin-lutein (OCUVITE-LUTEIN) CAPS capsule Take 1 capsule by mouth daily.   Yes [provider]  tiotropium (SPIRIVA) 18 MCG inhalation capsule Place 18 mcg into inhaler and inhale daily. 08/30/18  Yes [provider]  acetaminophen (TYLENOL) 500 MG tablet Take 1,000 mg by mouth every 6 (six) hours as needed for moderate pain or headache.    [provider]  atorvastatin (LIPITOR) 20 MG tablet Take 1 tablet (20 mg total) by mouth at bedtime. 09/14/18   Sharee HolsterGreen, Deborah S, NP  donepezil (ARICEPT) 5 MG tablet Take 5 mg by mouth at bedtime. 03/07/20   [provider]  mirtazapine (REMERON) 15 MG tablet Take 15 mg by mouth at bedtime.    [provider]  omeprazole (PRILOSEC) 40 MG capsule Take 1 capsule (40 mg  total) by mouth daily. Patient not taking: Reported on 05/13/2020 01/02/20   Lynn Ito, MD  oxyCODONE-acetaminophen (PERCOCET/ROXICET) 5-325 MG tablet Take 1 tablet by mouth every 6 (six) hours as needed for severe pain. Patient not taking: Reported on 2020-06-08 05/16/20   Anson Oregon, PA-C  sertraline (ZOLOFT) 100 MG tablet Take 1 tablet (100 mg total) by mouth at bedtime. 09/14/18   Sharee Holster, NP  Vitamin D, Ergocalciferol, (DRISDOL) 1.25 MG (50000 UNIT) CAPS capsule Take 50,000 Units by mouth every Friday.  11/28/19   [provider]      VITAL SIGNS:  Blood pressure (!) 159/85, pulse 96, temperature (!) 97.5 F (36.4 C), temperature source Oral, resp. rate (!) 26, weight 99.8 kg, SpO2 98 %.  PHYSICAL EXAMINATION:  Physical Exam  GENERAL:  86 y.o.-year-old Caucasian male patient lying in the bed with mild conversational dyspnea.  He had mildly slurred speech. EYES: Pupils equal, round, reactive to light and accommodation. No scleral icterus. Extraocular muscles intact.  HEENT: Head atraumatic, normocephalic. Oropharynx and nasopharynx clear.  NECK:  Supple, no jugular  venous distention. No thyroid enlargement, no tenderness.  LUNGS: Normal breath sounds bilaterally, no wheezing, rales,rhonchi or crepitation. No use of accessory muscles of respiration.  CARDIOVASCULAR: Regular rate and rhythm, S1, S2 normal. No murmurs, rubs, or gallops.  ABDOMEN: Soft, nondistended, nontender. Bowel sounds present. No organomegaly or mass.  EXTREMITIES: No pedal edema, cyanosis, or clubbing. Cranial nerves II through XII are intact except for mildly slurred speech. Muscle strength 5/5 in all extremities. Sensation intact. Gait not checked.  PSYCHIATRIC: The patient is alert and oriented x 3.  Normal affect and good eye contact. NEUROLOGIC:  SKIN: No obvious rash, lesion, or ulcer.   LABORATORY PANEL:   CBC Recent Labs  Lab 05/27/20 1722  WBC 11.1*  HGB 12.6*  HCT 39.7  PLT 127*   ------------------------------------------------------------------------------------------------------------------  Chemistries  Recent Labs  Lab 05/27/20 1722  NA 142  K 4.2  CL 103  CO2 28  GLUCOSE 139*  BUN 24*  CREATININE 1.10  CALCIUM 9.1  AST 29  ALT 25  ALKPHOS 74  BILITOT 0.9   ------------------------------------------------------------------------------------------------------------------  Cardiac Enzymes No results for input(s): TROPONINI in the last 168 hours. ------------------------------------------------------------------------------------------------------------------  RADIOLOGY:  CT Head Wo Contrast  Result Date: 05/27/2020 CLINICAL DATA:  Altered level of consciousness, slurred speech, delirium EXAM: CT HEAD WITHOUT CONTRAST TECHNIQUE: Contiguous axial images were obtained from the base of the skull through the vertex without intravenous contrast. COMPARISON:  04/02/2007 FINDINGS: Brain: There progressive confluent hypodensities throughout the periventricular and subcortical white matter, consistent with age-indeterminate small vessel ischemic changes.  Extensive hypodensities are also seen within the bilateral basal ganglia, more pronounced than previous. Area of chronic right frontal cortical infarct with associated encephalomalacia is seen. No acute hemorrhage. Lateral ventricles are unremarkable. No acute extra-axial fluid collections. No mass effect. Vascular: No hyperdense vessel or unexpected calcification. Skull: Normal. Negative for fracture or focal lesion. Sinuses/Orbits: No acute finding. Other: None. IMPRESSION: 1. Progressive age-indeterminate small vessel ischemic changes throughout the white matter and basal ganglia. 2. Chronic right frontal cortical infarct. 3. No acute hemorrhage. Electronically Signed   By: Sharlet Salina M.D.   On: 05/27/2020 17:40   DG Chest Portable 1 View  Result Date: 2020/06/08 CLINICAL DATA:  Cough, altered mental status, possible stroke EXAM: PORTABLE CHEST 1 VIEW COMPARISON:  Radiograph 05/01/2020, CT 05/01/2020 FINDINGS: Chronic elevation of the right hemidiaphragm with some adjacent scarring and or  atelectasis. Some increasing medial basilar opacities bilaterally may reflect worsening atelectasis or developing infection. No visible pneumothorax or effusion. Stable surgical material along the right brachiocephalic vasculature. Additional surgical clips in the left upper quadrant cardiomediastinal contours are unchanged from prior. No acute osseous or soft tissue abnormality. Degenerative changes are present in the imaged spine and shoulders. Telemetry leads overlie the chest. IMPRESSION: 1. Some increasing medial basilar opacities bilaterally may reflect worsening atelectasis or developing infection. 2. Chronic elevation of the right hemidiaphragm with some adjacent scarring and or atelectasis. Electronically Signed   By: Kreg Shropshire M.D.   On: 06-06-20 03:09      IMPRESSION AND PLAN:   1.  Bibasal pneumonia, suspect community-acquired despite recent hospitalizations. -The patient will be admitted to a  medical monitored bed. -Continue antibiotic therapy with IV Rocephin and Zithromax. -Mucolytic therapy will be provided. -We will obtain sputum culture and urine pneumonia antigens. -Duo nebs will be provided on a scheduled and as needed basis.  2.  Slurred speech, rule out TIA. -Noncontrasted head CT scan revealed no acute intracranial normalities. -Brain MRI without contrast revealed severe chronic small vessel ischemia and remote right anterior frontal cortical infarct, brain atrophy since 2017 and no acute findings. -We will follow neuro checks every 4 hours for 24 hours. -We will place the patient on baby aspirin. -We will check 2D echo on bilateral carotid Doppler. -PT ST and OT consults will be obtained.  3.  Hypertension. -We will continue Toprol-XL and amlodipine allowing permissive hypertension.  4.  Dyslipidemia. -We will continue statin therapy.  5.  Dementia. -We will continue Aricept.  6.  Recent deep thigh abscess. -We will continue Zyvox.  7.  DVT prophylaxis. -Subcutaneous Lovenox   All the records are reviewed and case discussed with ED provider. The plan of care was discussed in details with the patient (and family). I answered all questions. The patient agreed to proceed with the above mentioned plan. Further management will depend upon hospital course.   CODE STATUS: Full code  Status is: Inpatient  Remains inpatient appropriate because:Ongoing diagnostic testing needed not appropriate for outpatient work up, Unsafe d/c plan, IV treatments appropriate due to intensity of illness or inability to take PO and Inpatient level of care appropriate due to severity of illness   Dispo: The patient is from: Home              Anticipated d/c is to: Home              Anticipated d/c date is: 2 days              Patient currently is not medically stable to d/c.   TOTAL TIME TAKING CARE OF THIS PATIENT: 55 minutes.    Hannah Beat M.D on 2020-06-06 at 4:18  AM  [facility]   From 7 PM-7 AM, contact night-coverage [email or web address]  CC: Primary care physician; Marguarite Arbour, MD   Note: This dictation was prepared with Dragon dictation along with smaller phrase technology. Any transcriptional typo errors that result from this process are unintentional.

## 2020-05-28 NOTE — Evaluation (Signed)
Speech Language Pathology Evaluation Patient Details Name: JEREMAIH KLIMA MRN: [id number] DOB: Dec 10, 1931 Today's Date: 2020/05/31 Time: 1157-2620 SLP Time Calculation (min) (ACUTE ONLY): 10 min  Problem List:  Patient Active Problem List   Diagnosis Date Noted  . CAP (community acquired pneumonia) 2020/05/31  . Abscess of right thigh 05/14/2020  . Nocturnal hypoxia   . Acute kidney injury superimposed on CKD (HCC)   . Hyperkalemia   . COPD exacerbation (HCC)   . Hypokalemia   . HCAP (healthcare-associated pneumonia) 12/30/2019  . Lobar pneumonia (HCC) 12/29/2019  . Community acquired pneumonia 11/17/2019  . Acute hypoxemic respiratory failure (HCC) 11/17/2019  . GI bleed 02/07/2019  . Postoperative wound infection 10/17/2018  . Supracondylar fracture of distal end of femur without intracondylar extension (HCC) 10/17/2018  . Sepsis (HCC) 10/06/2018  . Chronic pulmonary embolism (HCC) 09/06/2018  . Primary osteoarthritis of right knee 09/06/2018  . Status post right knee replacement 09/06/2018  . Dyslipidemia associated with type 2 diabetes mellitus (HCC) 09/06/2018  . CKD stage 3 due to type 2 diabetes mellitus (HCC) 09/06/2018  . Type 2 diabetes mellitus with hyperlipidemia (HCC) 09/06/2018  . Chronic cerebrovascular accident (CVA) 09/06/2018  . GERD without esophagitis 09/06/2018  . Closed nondisplaced supracondylar fracture of distal end of femur without intracondylar extension (HCC) 09/06/2018  . Acute on chronic anemia 09/06/2018  . Depression, major, single episode, in partial remission (HCC) 09/06/2018  . Bilateral lower extremity edema 09/06/2018  . Chronic gout due to renal impairment 09/06/2018  . Acute respiratory failure with hypoxia (HCC) 08/28/2018  . Spondylolisthesis of lumbar region 02/15/2018  . PAD (peripheral artery disease) (HCC) 08/19/2017  . Hyperlipidemia 07/05/2017  . Essential hypertension 07/05/2017  . Bilateral carotid artery stenosis 07/05/2017   . Primary localized osteoarthritis of right hip 03/02/2017   Past Medical History:  Past Medical History:  Diagnosis Date  . Depression   . Diabetes mellitus without complication (HCC)   . Dyspnea   . Elevated lipids   . Femur fracture (HCC) 08/22/2018  . GERD (gastroesophageal reflux disease)   . Hypertension   . Pulmonary emboli (HCC)   . Renal insufficiency    only has 1 kidney  . Sleep apnea   . Stroke (HCC)    Rt side weakness   Past Surgical History:  Past Surgical History:  Procedure Laterality Date  . 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 AORTA SURGERY    . CAROTID ARTERY - SUBCLAVIAN ARTERY BYPASS GRAFT Bilateral   . CATARACT EXTRACTION W/ INTRAOCULAR LENS  IMPLANT, BILATERAL    . CHOLECYSTECTOMY    . EYE SURGERY    . FEMUR FRACTURE SURGERY Right   . HIP FRACTURE SURGERY Bilateral   . I & D EXTREMITY Right 10/18/2018   Procedure: IRRIGATION AND DEBRIDEMENT EXTREMITY, POSSIBLE HARDWARE REMOVAL OF SUPERCONDYLAR PLATE;  Surgeon: Christena Flake, MD;  Location: [facility];  Service: Orthopedics;  Laterality: Right;  . I & D EXTREMITY Right 05/14/2020   Procedure: IRRIGATION AND DEBRIDEMENT OF RIGHT THIGH ABCESS;  Surgeon: Christena Flake, MD;  Location: [facility];  Service: Orthopedics;  Laterality: Right;  . JOINT REPLACEMENT    . ORIF FEMUR FRACTURE Right 08/24/2018   Procedure: OPEN REDUCTION INTERNAL FIXATION (ORIF) SUPRACONDYLAR FEMUR FRACTURE;  Surgeon: Christena Flake, MD;  Location: [facility];  Service: Orthopedics;  Laterality: Right;  . ORIF FEMUR FRACTURE Right 10/18/2018   Procedure: HARDWARE REMOVAL- DISTAL FEMUR FRACTURE FIXATION;  Surgeon: Christena Flake, MD;  Location: [facility];  Service: Orthopedics;  Laterality: Right;  . PROSTATE SURGERY    . REPLACEMENT TOTAL KNEE Right   . TOTAL HIP ARTHROPLASTY Right 03/02/2017   Procedure: TOTAL HIP ARTHROPLASTY ANTERIOR APPROACH;  Surgeon: Kennedy Bucker, MD;  Location: [facility];  Service:  Orthopedics;  Laterality: Right;  Marland Kitchen VASCULAR SURGERY     HPI:  Pt is an 86 y/o M with PMH including: anxiety, depression, DM, HTN, OSA, CVA with residual R side weakness and slurred speech. 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 Pt is 14 days post op I&D of deep right thigh abscess by Dr. Joice Lofts. Pt presented with cough and dyspnea. ED w/u included WBC elevated and chest x-ray showed some increasing medial basilar opacities bilaterally. Pt adm for bi-basilar PNA.   Assessment / Plan / Recommendation Clinical Impression  Pt with mild confusion, uncertain if related to neurological function or current medical situation/illness. Most recent MRI revealed no acute findings, severe chronic small vessel ischemia, remote right anterior frontal cortex infarct and brain atrophy since 2017. Pt exhibits decreased verbal initiation, he is oriented to person and place, no physical attempts to follow 1 step directions, decreased sustained attention to task but good overall awareness of current medical state. ST will follow to help with differential diagnosis of cognitive deficits.     SLP Assessment  SLP Recommendation/Assessment: Patient needs continued Speech Lanaguage Pathology Services SLP Visit Diagnosis: Cognitive communication deficit (R41.841)    Follow Up Recommendations  Skilled Nursing facility    Frequency and Duration min 2x/week  2 weeks      SLP Evaluation Cognition  Overall Cognitive Status: No family/caregiver present to determine baseline cognitive functioning Arousal/Alertness: Lethargic Orientation Level: Oriented to person;Oriented to place Attention: Sustained Sustained Attention: Impaired Sustained Attention Impairment: Verbal basic;Functional basic Memory: Impaired Memory Impairment: Decreased recall of new information Awareness:  (general awareness of medical situation) Behaviors: Lability       Comprehension  Auditory Comprehension Overall  Auditory Comprehension: Appears within functional limits for tasks assessed Visual Recognition/Discrimination Discrimination: Not tested Reading Comprehension Reading Status: Not tested    Expression Expression Primary Mode of Expression: Verbal Verbal Expression Overall Verbal Expression: Appears within functional limits for tasks assessed Written Expression Dominant Hand: Right Written Expression: Not tested   Oral / Motor  Oral Motor/Sensory Function Overall Oral Motor/Sensory Function: Generalized oral weakness Motor Speech Overall Motor Speech: Appears within functional limits for tasks assessed Respiration: Impaired Phonation: Breathy;Low vocal intensity Intelligibility: Intelligibility reduced (d/t poor respiratory support) Sentence: 25-49% accurate Motor Planning: Witnin functional limits Motor Speech Errors: Not applicable   GO                   Nel Stoneking B. Dreama Saa M.S., CCC-SLP, [facility] Speech-Language Pathologist Rehabilitation Services Office [phone]  Reuel Derby 05/19/2020, 5:40 PM

## 2020-05-28 NOTE — ED Notes (Signed)
Patient transported to MRI, will complete labs and give meds upon return

## 2020-05-28 NOTE — ED Notes (Signed)
Ultrasound at bedside

## 2020-05-28 NOTE — ED Notes (Signed)
Pt placed in hospital bed with assistance of PT and SLP.

## 2020-05-28 NOTE — Evaluation (Addendum)
Clinical/Bedside Swallow Evaluation Patient Details  Name: Gary Thomas MRN: [id number] Date of Birth: 10-Dec-1931  Today's Date: 05/17/2020 Time: SLP Start Time (ACUTE ONLY): 0903 SLP Stop Time (ACUTE ONLY): 0913 SLP Time Calculation (min) (ACUTE ONLY): 10 min  Past Medical History:  Past Medical History:  Diagnosis Date  . Depression   . Diabetes mellitus without complication (HCC)   . Dyspnea   . Elevated lipids   . Femur fracture (HCC) 08/22/2018  . GERD (gastroesophageal reflux disease)   . Hypertension   . Pulmonary emboli (HCC)   . Renal insufficiency    only has 1 kidney  . Sleep apnea   . Stroke (HCC)    Rt side weakness   Past Surgical History:  Past Surgical History:  Procedure Laterality Date  . 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 AORTA SURGERY    . CAROTID ARTERY - SUBCLAVIAN ARTERY BYPASS GRAFT Bilateral   . CATARACT EXTRACTION W/ INTRAOCULAR LENS  IMPLANT, BILATERAL    . CHOLECYSTECTOMY    . EYE SURGERY    . FEMUR FRACTURE SURGERY Right   . HIP FRACTURE SURGERY Bilateral   . I & D EXTREMITY Right 10/18/2018   Procedure: IRRIGATION AND DEBRIDEMENT EXTREMITY, POSSIBLE HARDWARE REMOVAL OF SUPERCONDYLAR PLATE;  Surgeon: Christena Flake, MD;  Location: [facility];  Service: Orthopedics;  Laterality: Right;  . I & D EXTREMITY Right 05/14/2020   Procedure: IRRIGATION AND DEBRIDEMENT OF RIGHT THIGH ABCESS;  Surgeon: Christena Flake, MD;  Location: [facility];  Service: Orthopedics;  Laterality: Right;  . JOINT REPLACEMENT    . ORIF FEMUR FRACTURE Right 08/24/2018   Procedure: OPEN REDUCTION INTERNAL FIXATION (ORIF) SUPRACONDYLAR FEMUR FRACTURE;  Surgeon: Christena Flake, MD;  Location: [facility];  Service: Orthopedics;  Laterality: Right;  . ORIF FEMUR FRACTURE Right 10/18/2018   Procedure: HARDWARE REMOVAL- DISTAL FEMUR FRACTURE FIXATION;  Surgeon: Christena Flake, MD;  Location: [facility];  Service: Orthopedics;  Laterality: Right;  .  PROSTATE SURGERY    . REPLACEMENT TOTAL KNEE Right   . TOTAL HIP ARTHROPLASTY Right 03/02/2017   Procedure: TOTAL HIP ARTHROPLASTY ANTERIOR APPROACH;  Surgeon: Kennedy Bucker, MD;  Location: [facility];  Service: Orthopedics;  Laterality: Right;  Marland Kitchen VASCULAR SURGERY     HPI:  Pt is an 86 y/o M with PMH including: anxiety, depression, DM, HTN, OSA, CVA with residual R side weakness and slurred speech. 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 Pt is 14 days post op I&D of deep right thigh abscess by Dr. Joice Lofts. Pt presented with cough and dyspnea. ED w/u included WBC elevated and chest x-ray showed some increasing medial basilar opacities bilaterally. Pt adm for bi-basilar PNA.   Assessment / Plan / Recommendation Clinical Impression  Pt is at SEVERE risk of aspirating d/t multiple medical comorbidities. Pt is extremely weak overall and has great difficulty with any bed mobility as well as bringing his hand to his mouth. As such, pt's cough is almost non-existent d/t weakness. At rest, pt's respirations sound wet. After being re-positioned in bed and oral care, pt was given 2 trials of ice chips. Pt exhibited a lengthy oral phase with no hyoid excursion visualized or palpated at the neck. At this time, recommend pt continue NPO status with aggressive oral care. ST will continue to follow for PO appropriateness.  SLP Visit Diagnosis: Dysphagia, oropharyngeal phase (R13.12);Dysphagia, pharyngoesophageal phase (R13.14)    Aspiration  Risk  Severe aspiration risk;Risk for inadequate nutrition/hydration    Diet Recommendation   NPO  Medication Administration: Via alternative means    Other  Recommendations Oral Care Recommendations: Oral care QID Other Recommendations: Have oral suction available   Follow up Recommendations Skilled Nursing facility      Frequency and Duration min 2x/week  2 weeks       Prognosis Prognosis for Safe Diet Advancement: Guarded Barriers to  Reach Goals: Cognitive deficits;Time post onset;Severity of deficits      Swallow Study   General Date of Onset: 06-12-20 HPI: Pt is an 86 y/o M with PMH including: anxiety, depression, DM, HTN, OSA, CVA with residual R side weakness and slurred speech. 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 Pt is 14 days post op I&D of deep right thigh abscess by Dr. Joice Lofts. Pt presented with cough and dyspnea. ED w/u included WBC elevated and chest x-ray showed some increasing medial basilar opacities bilaterally. Pt adm for bi-basilar PNA. Type of Study: Bedside Swallow Evaluation Previous Swallow Assessment: 2020, 2021 - most recent 05/16/2020 Diet Prior to this Study: NPO Temperature Spikes Noted: No Respiratory Status: Nasal cannula History of Recent Intubation: No Behavior/Cognition: Alert Oral Cavity Assessment: Dried secretions Oral Care Completed by SLP: Yes Oral Cavity - Dentition: Dentures, top;Missing dentition Self-Feeding Abilities: Total assist Patient Positioning: Upright in bed Baseline Vocal Quality: Low vocal intensity Volitional Cough: Weak Volitional Swallow: Unable to elicit    Oral/Motor/Sensory Function Overall Oral Motor/Sensory Function: Generalized oral weakness   Ice Chips Ice chips: Impaired Presentation: Spoon Pharyngeal Phase Impairments: Decreased hyoid-laryngeal movement   Thin Liquid Thin Liquid: Not tested    Nectar Thick Nectar Thick Liquid: Not tested   Honey Thick Honey Thick Liquid: Not tested   Puree Puree: Not tested   Solid     Solid: Not tested     Addalie Calles B. Dreama Saa M.S., CCC-SLP, [facility] Speech-Language Pathologist Rehabilitation Services Office [phone]  Nikos Anglemyer Jun 12, 2020,1:51 PM

## 2020-05-28 NOTE — Progress Notes (Signed)
PT Cancellation Note  Patient Details Name: Gary Thomas MRN: [id number] DOB: 22-Feb-1932   Cancelled Treatment:    Reason Eval/Treat Not Completed: Other (comment) (Pt able to open eyes mostly consistently on command and squeeze hands, but does not speak to PT. Attempted LE movement/exercises, pt unable to participate due to lethargy at this time. PT to re-attempt as able.)   Olga Coaster PT, DPT 10:54 AM,05/19/2020

## 2020-05-28 NOTE — ED Provider Notes (Signed)
[facility] Emergency Department Provider Note  ____________________________________________  Time seen: Approximately 3:13 AM  I have reviewed the triage vital signs and the nursing notes.   HISTORY  Chief Complaint Altered Mental Status   HPI Gary Thomas is a 86 y.o. male history of diabetes, hypertension, CVA with right-sided weakness, COPD, pulmonary embolism, chronic kidney disease, hypertension, hyperlipidemia , and postop day 14 of I&D of deep right thigh abscess by Dr.Poggi presents accompanied by his wife for altered mental status.  According to the wife patient's mental status has been declining for the last week.  He has been unable to use his hands, has been unable to follow commands, has had intermittent slurred speech.  Patient is currently on linezolid for his infection.  He has had no fever, no vomiting or diarrhea.  He has not complained of chest pain or shortness of breath or abdominal pain.  Patient is wife reports that he has been very hard to take care of him at home.  He has had a productive cough for about a week.  He denies chest pain or shortness of breath, he denies abdominal pain, dysuria.   Past Medical History:  Diagnosis Date  . Depression   . Diabetes mellitus without complication (HCC)   . Dyspnea   . Elevated lipids   . Femur fracture (HCC) 08/22/2018  . GERD (gastroesophageal reflux disease)   . Hypertension   . Pulmonary emboli (HCC)   . Renal insufficiency    only has 1 kidney  . Sleep apnea   . Stroke (HCC)    Rt side weakness    Patient Active Problem List   Diagnosis Date Noted  . Abscess of right thigh 05/14/2020  . Nocturnal hypoxia   . Acute kidney injury superimposed on CKD (HCC)   . Hyperkalemia   . COPD exacerbation (HCC)   . Hypokalemia   . HCAP (healthcare-associated pneumonia) 12/30/2019  . Lobar pneumonia (HCC) 12/29/2019  . Community acquired pneumonia 11/17/2019  . Acute hypoxemic  respiratory failure (HCC) 11/17/2019  . GI bleed 02/07/2019  . Postoperative wound infection 10/17/2018  . Supracondylar fracture of distal end of femur without intracondylar extension (HCC) 10/17/2018  . Sepsis (HCC) 10/06/2018  . Chronic pulmonary embolism (HCC) 09/06/2018  . Primary osteoarthritis of right knee 09/06/2018  . Status post right knee replacement 09/06/2018  . Dyslipidemia associated with type 2 diabetes mellitus (HCC) 09/06/2018  . CKD stage 3 due to type 2 diabetes mellitus (HCC) 09/06/2018  . Type 2 diabetes mellitus with hyperlipidemia (HCC) 09/06/2018  . Chronic cerebrovascular accident (CVA) 09/06/2018  . GERD without esophagitis 09/06/2018  . Closed nondisplaced supracondylar fracture of distal end of femur without intracondylar extension (HCC) 09/06/2018  . Acute on chronic anemia 09/06/2018  . Depression, major, single episode, in partial remission (HCC) 09/06/2018  . Bilateral lower extremity edema 09/06/2018  . Chronic gout due to renal impairment 09/06/2018  . Acute respiratory failure with hypoxia (HCC) 08/28/2018  . Spondylolisthesis of lumbar region 02/15/2018  . PAD (peripheral artery disease) (HCC) 08/19/2017  . Hyperlipidemia 07/05/2017  . Essential hypertension 07/05/2017  . Bilateral carotid artery stenosis 07/05/2017  . Primary localized osteoarthritis of right hip 03/02/2017    Past Surgical History:  Procedure Laterality Date  . 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 AORTA SURGERY    . CAROTID ARTERY - SUBCLAVIAN ARTERY BYPASS GRAFT Bilateral   . CATARACT EXTRACTION W/ INTRAOCULAR LENS  IMPLANT, BILATERAL    . CHOLECYSTECTOMY    . EYE SURGERY    . FEMUR FRACTURE SURGERY Right   . HIP FRACTURE SURGERY Bilateral   . I & D EXTREMITY Right 10/18/2018   Procedure: IRRIGATION AND DEBRIDEMENT EXTREMITY, POSSIBLE HARDWARE REMOVAL OF SUPERCONDYLAR PLATE;  Surgeon: Christena Flake, MD;  Location: [facility];  Service:  Orthopedics;  Laterality: Right;  . I & D EXTREMITY Right 05/14/2020   Procedure: IRRIGATION AND DEBRIDEMENT OF RIGHT THIGH ABCESS;  Surgeon: Christena Flake, MD;  Location: [facility];  Service: Orthopedics;  Laterality: Right;  . JOINT REPLACEMENT    . ORIF FEMUR FRACTURE Right 08/24/2018   Procedure: OPEN REDUCTION INTERNAL FIXATION (ORIF) SUPRACONDYLAR FEMUR FRACTURE;  Surgeon: Christena Flake, MD;  Location: [facility];  Service: Orthopedics;  Laterality: Right;  . ORIF FEMUR FRACTURE Right 10/18/2018   Procedure: HARDWARE REMOVAL- DISTAL FEMUR FRACTURE FIXATION;  Surgeon: Christena Flake, MD;  Location: [facility];  Service: Orthopedics;  Laterality: Right;  . PROSTATE SURGERY    . REPLACEMENT TOTAL KNEE Right   . TOTAL HIP ARTHROPLASTY Right 03/02/2017   Procedure: TOTAL HIP ARTHROPLASTY ANTERIOR APPROACH;  Surgeon: Kennedy Bucker, MD;  Location: [facility];  Service: Orthopedics;  Laterality: Right;  Marland Kitchen VASCULAR SURGERY      Prior to Admission medications   Medication Sig Start Date End Date Taking? Authorizing Provider  amLODipine (NORVASC) 5 MG tablet Take 5 mg by mouth daily.   Yes [provider]  fluticasone (FLONASE) 50 MCG/ACT nasal spray Place 2 sprays into both nostrils daily. 05/04/20  Yes Alford Highland, MD  furosemide (LASIX) 40 MG tablet Take 40 mg by mouth every Monday, Wednesday, and Friday.   Yes [provider]  linezolid (ZYVOX) 600 MG tablet Take 1 tablet (600 mg total) by mouth 2 (two) times daily for 14 days. 05/17/20 05/31/20 Yes Lynn Ito, MD  megestrol (MEGACE) 40 MG/ML suspension Take 10 mLs by mouth daily. 05/23/20  Yes [provider]  metoprolol succinate (TOPROL-XL) 100 MG 24 hr tablet Take 100 mg by mouth daily. 03/12/20  Yes [provider]  multivitamin-lutein (OCUVITE-LUTEIN) CAPS capsule Take 1 capsule by mouth daily.   Yes [provider]  tiotropium (SPIRIVA) 18 MCG inhalation capsule Place 18 mcg into inhaler and  inhale daily. 08/30/18  Yes [provider]  acetaminophen (TYLENOL) 500 MG tablet Take 1,000 mg by mouth every 6 (six) hours as needed for moderate pain or headache.    [provider]  atorvastatin (LIPITOR) 20 MG tablet Take 1 tablet (20 mg total) by mouth at bedtime. 09/14/18   Sharee Holster, NP  donepezil (ARICEPT) 5 MG tablet Take 5 mg by mouth at bedtime. 03/07/20   [provider]  mirtazapine (REMERON) 15 MG tablet Take 15 mg by mouth at bedtime.    [provider]  omeprazole (PRILOSEC) 40 MG capsule Take 1 capsule (40 mg total) by mouth daily. Patient not taking: Reported on 05/13/2020 01/02/20   Lynn Ito, MD  oxyCODONE-acetaminophen (PERCOCET/ROXICET) 5-325 MG tablet Take 1 tablet by mouth every 6 (six) hours as needed for severe pain. Patient not taking: Reported on Jun 11, 2020 05/16/20   Anson Oregon, PA-C  sertraline (ZOLOFT) 100 MG tablet Take 1 tablet (100 mg total) by mouth at bedtime. 09/14/18   Sharee Holster, NP  Vitamin D, Ergocalciferol, (DRISDOL) 1.25 MG (50000 UNIT) CAPS capsule Take 50,000 Units by mouth every Friday.  11/28/19   [provider]    Allergies Butorphanol, Codeine, Morphine, Gabapentin, Lyrica [pregabalin], Tapentadol, and Tramadol  Family History  Problem Relation Age of Onset  . Heart attack Father   . Stroke Father   . Heart disease Father   . Heart attack Mother     Social History Social History   Tobacco Use  . Smoking status: Former Smoker    Packs/day: 1.00    Years: 7.00    Pack years: 7.00    Types: Cigarettes    Quit date: 02/18/1952    Years since quitting: 68.3  . Smokeless tobacco: Former Clinical biochemist  . Vaping Use: Never used  Substance Use Topics  . Alcohol use: No  . Drug use: No    Review of Systems  Constitutional: Negative for fever. + AMS Eyes: Negative for visual changes. ENT: Negative for sore throat. Neck: No neck pain  Cardiovascular: Negative for chest  pain. Respiratory: Negative for shortness of breath. + cough Gastrointestinal: Negative for abdominal pain, vomiting or diarrhea. Genitourinary: Negative for dysuria. Musculoskeletal: Negative for back pain. Skin: Negative for rash. Neurological: Negative for headaches, weakness or numbness. + slurred speech Psych: No SI or HI  ____________________________________________   PHYSICAL EXAM:  VITAL SIGNS: ED Triage Vitals [05/27/20 1648]  Enc Vitals Group     BP (!) 162/74     Pulse Rate 89     Resp 18     Temp 98 F (36.7 C)     Temp Source Oral     SpO2 97 %     Weight 220 lb (99.8 kg)     Height      Head Circumference      Peak Flow      Pain Score      Pain Loc      Pain Edu?      Excl. in GC?     Constitutional: Alert and oriented x2. Well appearing and in no apparent distress. HEENT:      Head: Normocephalic and atraumatic.         Eyes: Conjunctivae are normal. Sclera is non-icteric.       Mouth/Throat: Mucous membranes are moist.       Neck: Supple with no signs of meningismus. Cardiovascular: Regular rate and rhythm. No murmurs, gallops, or rubs.  Respiratory: Normal respiratory effort. Lungs are clear to auscultation bilaterally.  Gastrointestinal: Soft, non tender. Musculoskeletal: Surgical site on the right lateral proximal femoral area with stitches in place with no warmth, no erythema, no tenderness, no purulent discharge, no bulla, no crepitus.  Looks well-healing.. No edema, cyanosis, or erythema of extremities. Neurologic: Slurred speech, intact strength and sensation x4, face is symmetric, extraocular movements are intact, pupils are equal round and reactive.  Patient is following commands and is able to push me away and pull me towards him.  But he is unable to lift both his hands for pronator drift evaluation.  He develops a pretty significant tremor when trying to do that.  . Skin: Skin is warm, dry and intact. No rash noted. Psychiatric: Mood and  affect are normal. Speech and behavior are normal.  ____________________________________________   LABS (all labs ordered are listed, but only abnormal results are displayed)  Labs Reviewed  COMPREHENSIVE METABOLIC PANEL - Abnormal; Notable for the following components:      Result Value   Glucose, Bld 139 (*)    BUN 24 (*)    Albumin 3.1 (*)    All other  components within normal limits  CBC - Abnormal; Notable for the following components:   WBC 11.1 (*)    Hemoglobin 12.6 (*)    Platelets 127 (*)    All other components within normal limits  URINALYSIS, COMPLETE (UACMP) WITH MICROSCOPIC - Abnormal; Notable for the following components:   Color, Urine YELLOW (*)    APPearance HAZY (*)    Hgb urine dipstick LARGE (*)    Protein, ur 100 (*)    All other components within normal limits  TROPONIN I (HIGH SENSITIVITY) - Abnormal; Notable for the following components:   Troponin I (High Sensitivity) 23 (*)    All other components within normal limits  SARS CORONAVIRUS 2 BY RT PCR (HOSPITAL ORDER, PERFORMED IN [facility])  LACTIC ACID, PLASMA  LACTIC ACID, PLASMA  PROCALCITONIN  CBG MONITORING, ED   ____________________________________________  EKG  ED ECG REPORT I, Nita Sickle, the attending physician, personally viewed and interpreted this ECG.  Normal sinus rhythm, rate of 85, normal intervals, normal axis, anterior Q waves, no ST elevations or depressions.  No significant changes when compared to prior. ____________________________________________  RADIOLOGY  I have personally reviewed the images performed during this visit and I agree with the Radiologist's read.   Interpretation by Radiologist:  CT Head Wo Contrast  Result Date: 05/27/2020 CLINICAL DATA:  Altered level of consciousness, slurred speech, delirium EXAM: CT HEAD WITHOUT CONTRAST TECHNIQUE: Contiguous axial images were obtained from the base of the skull through the vertex without  intravenous contrast. COMPARISON:  04/02/2007 FINDINGS: Brain: There progressive confluent hypodensities throughout the periventricular and subcortical white matter, consistent with age-indeterminate small vessel ischemic changes. Extensive hypodensities are also seen within the bilateral basal ganglia, more pronounced than previous. Area of chronic right frontal cortical infarct with associated encephalomalacia is seen. No acute hemorrhage. Lateral ventricles are unremarkable. No acute extra-axial fluid collections. No mass effect. Vascular: No hyperdense vessel or unexpected calcification. Skull: Normal. Negative for fracture or focal lesion. Sinuses/Orbits: No acute finding. Other: None. IMPRESSION: 1. Progressive age-indeterminate small vessel ischemic changes throughout the white matter and basal ganglia. 2. Chronic right frontal cortical infarct. 3. No acute hemorrhage. Electronically Signed   By: Sharlet Salina M.D.   On: 05/27/2020 17:40   DG Chest Portable 1 View  Result Date: 06/05/2020 CLINICAL DATA:  Cough, altered mental status, possible stroke EXAM: PORTABLE CHEST 1 VIEW COMPARISON:  Radiograph 05/01/2020, CT 05/01/2020 FINDINGS: Chronic elevation of the right hemidiaphragm with some adjacent scarring and or atelectasis. Some increasing medial basilar opacities bilaterally may reflect worsening atelectasis or developing infection. No visible pneumothorax or effusion. Stable surgical material along the right brachiocephalic vasculature. Additional surgical clips in the left upper quadrant cardiomediastinal contours are unchanged from prior. No acute osseous or soft tissue abnormality. Degenerative changes are present in the imaged spine and shoulders. Telemetry leads overlie the chest. IMPRESSION: 1. Some increasing medial basilar opacities bilaterally may reflect worsening atelectasis or developing infection. 2. Chronic elevation of the right hemidiaphragm with some adjacent scarring and or  atelectasis. Electronically Signed   By: Kreg Shropshire M.D.   On: 05/29/2020 03:09     ____________________________________________   PROCEDURES  Procedure(s) performed:yes .1-3 Lead EKG Interpretation Performed by: Nita Sickle, MD Authorized by: Nita Sickle, MD     Interpretation: non-specific     ECG rate assessment: normal     Rhythm: sinus rhythm     Ectopy: none     Critical Care performed: yes  CRITICAL  CARE Performed by: Nita Sicklearolina Aava Deland  ?  Total critical care time: 40 min  Critical care time was exclusive of separately billable procedures and treating other patients.  Critical care was necessary to treat or prevent imminent or life-threatening deterioration.  Critical care was time spent personally by me on the following activities: development of treatment plan with patient and/or surrogate as well as nursing, discussions with consultants, evaluation of patient's response to treatment, examination of patient, obtaining history from patient or surrogate, ordering and performing treatments and interventions, ordering and review of laboratory studies, ordering and review of radiographic studies, pulse oximetry and re-evaluation of patient's condition.  ____________________________________________   INITIAL IMPRESSION / ASSESSMENT AND PLAN / ED COURSE  86 y.o. male history of diabetes, hypertension, CVA with right-sided weakness, COPD, pulmonary embolism, chronic kidney disease, hypertension, hyperlipidemia , and postop day 14 of I&D of deep right thigh abscess by Dr.Poggi presents accompanied by his wife for altered mental status and cough x 7 days.  Patient does not seem in any distress, is alert and oriented x2.  Does have a tremor and has some difficulty following moving his arms up and down independently.  He is able although to squeeze my hands and push me and pull me.  There is no neurological deficit appreciated.  Surgical bed looks well-healing with  no signs of infection or crepitus.  Vitals with no fever or signs of sepsis.  He does have a very productive cough and a x-ray concerning for pneumonia, confirmed by radiology.  No oxygen requirement and no signs of sepsis however since patient was intubated 2 weeks ago for his I&D in the OR will cover for HCAP with cefepime and vancomycin.  CT head is negative for any acute findings.  Low suspicion for stroke however due to slurred speech we will send patient for an MRI.  UA negative for UTI.  Labs with no significant electrolyte derangements.  Will discuss with hospitalist for admission.  History gathered from patient and his wife the bedside, plan discussed with both.  Old medical records reviewed.  Patient placed on telemetry for close monitoring.  _________________________ 4:10 AM on 05/29/2020 -----------------------------------------  Troponin slightly elevated at 23 most likely due to demand ischemia in the setting of infection.  Patient satting 92% on room air and was put on 2 L nasal cannula.  Discussed with the hospitalist for admission      _____________________________________________ Please note:  Patient was evaluated in Emergency Department today for the symptoms described in the history of present illness. Patient was evaluated in the context of the global COVID-19 pandemic, which necessitated consideration that the patient might be at risk for infection with the SARS-CoV-2 virus that causes COVID-19. Institutional protocols and algorithms that pertain to the evaluation of patients at risk for COVID-19 are in a state of rapid change based on information released by regulatory bodies including the CDC and federal and state organizations. These policies and algorithms were followed during the patient's care in the ED.  Some ED evaluations and interventions may be delayed as a result of limited staffing during the pandemic.   [place] Controlled Substance Database was reviewed by  me. ____________________________________________   FINAL CLINICAL IMPRESSION(S) / ED DIAGNOSES   Final diagnoses:  Encephalopathy acute  HCAP (healthcare-associated pneumonia)  Acute respiratory failure with hypoxia (HCC)  Demand ischemia (HCC)      NEW MEDICATIONS STARTED DURING THIS VISIT:  ED Discharge Orders    None  Note:  This document was prepared using Dragon voice recognition software and may include unintentional dictation errors.    Don Perking, [place], MD 06/11/20 [phone]

## 2020-05-28 NOTE — ED Notes (Signed)
Pt refusing swallow screen at this time 

## 2020-05-29 ENCOUNTER — Other Ambulatory Visit: Payer: Self-pay

## 2020-05-29 ENCOUNTER — Inpatient Hospital Stay: Payer: Medicare Other

## 2020-05-29 ENCOUNTER — Encounter: Payer: Self-pay | Admitting: Family Medicine

## 2020-05-29 DIAGNOSIS — A4902 Methicillin resistant Staphylococcus aureus infection, unspecified site: Secondary | ICD-10-CM

## 2020-05-29 DIAGNOSIS — R531 Weakness: Secondary | ICD-10-CM

## 2020-05-29 DIAGNOSIS — G9341 Metabolic encephalopathy: Secondary | ICD-10-CM

## 2020-05-29 LAB — HIV ANTIBODY (ROUTINE TESTING W REFLEX): HIV Screen 4th Generation wRfx: NONREACTIVE

## 2020-05-29 LAB — CBC
HCT: 34 % — ABNORMAL LOW (ref 39.0–52.0)
Hemoglobin: 10.7 g/dL — ABNORMAL LOW (ref 13.0–17.0)
MCH: 29.3 pg (ref 26.0–34.0)
MCHC: 31.5 g/dL (ref 30.0–36.0)
MCV: 93.2 fL (ref 80.0–100.0)
Platelets: 98 10*3/uL — ABNORMAL LOW (ref 150–400)
RBC: 3.65 MIL/uL — ABNORMAL LOW (ref 4.22–5.81)
RDW: 13.8 % (ref 11.5–15.5)
WBC: 8.5 10*3/uL (ref 4.0–10.5)
nRBC: 0 % (ref 0.0–0.2)

## 2020-05-29 LAB — HEMOGLOBIN A1C
Hgb A1c MFr Bld: 5.8 % — ABNORMAL HIGH (ref 4.8–5.6)
Mean Plasma Glucose: 119.76 mg/dL

## 2020-05-29 LAB — LIPID PANEL
Cholesterol: 132 mg/dL (ref 0–200)
HDL: 28 mg/dL — ABNORMAL LOW (ref >40)
LDL Cholesterol: 86 mg/dL (ref 0–99)
Total CHOL/HDL Ratio: 4.7 RATIO
Triglycerides: 92 mg/dL (ref <150)
VLDL: 18 mg/dL (ref 0–40)

## 2020-05-29 LAB — BASIC METABOLIC PANEL
Anion gap: 8 (ref 5–15)
BUN: 19 mg/dL (ref 8–23)
CO2: 29 mmol/L (ref 22–32)
Calcium: 9.2 mg/dL (ref 8.9–10.3)
Chloride: 107 mmol/L (ref 98–111)
Creatinine, Ser: 1 mg/dL (ref 0.61–1.24)
GFR calc Af Amer: >60 mL/min (ref >60)
GFR calc non Af Amer: >60 mL/min (ref >60)
Glucose, Bld: 106 mg/dL — ABNORMAL HIGH (ref 70–99)
Potassium: 4 mmol/L (ref 3.5–5.1)
Sodium: 144 mmol/L (ref 135–145)

## 2020-05-29 LAB — LEGIONELLA PNEUMOPHILA SEROGP 1 UR AG: L. pneumophila Serogp 1 Ur Ag: NEGATIVE

## 2020-05-29 MED ORDER — CLONIDINE HCL 0.2 MG/24HR TD PTWK
0.2000 mg | MEDICATED_PATCH | TRANSDERMAL | Status: DC
  Administered 2020-05-29: 0.2 mg via TRANSDERMAL
  Filled 2020-05-29: qty 1

## 2020-05-29 MED ORDER — LABETALOL HCL 5 MG/ML IV SOLN
10.0000 mg | INTRAVENOUS | Status: DC | PRN
  Administered 2020-05-29 – 2020-05-31 (×3): 10 mg via INTRAVENOUS
  Filled 2020-05-29 (×4): qty 4

## 2020-05-29 MED ORDER — SODIUM CHLORIDE 0.9 % IV SOLN: INTRAVENOUS | Status: DC

## 2020-05-29 MED ORDER — AZITHROMYCIN 500 MG PO TABS
250.0000 mg | ORAL_TABLET | Freq: Every day | ORAL | Status: DC
  Administered 2020-05-30 – 2020-05-31 (×2): 250 mg via ORAL
  Filled 2020-05-29 (×2): qty 1

## 2020-05-29 MED ORDER — SODIUM CHLORIDE 0.9 % IV SOLN
2.0000 g | Freq: Three times a day (TID) | INTRAVENOUS | Status: DC
  Administered 2020-05-29 – 2020-05-31 (×6): 2 g via INTRAVENOUS
  Filled 2020-05-29 (×9): qty 2

## 2020-05-29 MED ORDER — AMLODIPINE BESYLATE 5 MG PO TABS
5.0000 mg | ORAL_TABLET | Freq: Every day | ORAL | Status: DC
  Administered 2020-05-29 – 2020-06-01 (×4): 5 mg via ORAL
  Filled 2020-05-29 (×4): qty 1

## 2020-05-29 MED ORDER — AMLODIPINE BESYLATE 5 MG PO TABS: 5.0000 mg | ORAL_TABLET | ORAL | Status: DC

## 2020-05-29 MED ORDER — IOHEXOL 350 MG/ML SOLN
75.0000 mL | Freq: Once | INTRAVENOUS | Status: AC | PRN
  Administered 2020-05-29: 75 mL via INTRAVENOUS

## 2020-05-29 NOTE — Progress Notes (Signed)
PT Cancellation Note  Patient Details Name: SHYHEEM WHITHAM MRN: [id number] DOB: [birth year]   Cancelled Treatment:    Reason Eval/Treat Not Completed: Other (comment) (Pt out of his room at this time per chart to assess for LE DVT. PT to follow up as able.)   Olga Coaster PT, DPT 3:05 PM,05/29/20

## 2020-05-29 NOTE — Progress Notes (Signed)
Report called to 1C 

## 2020-05-29 NOTE — Progress Notes (Signed)
Pt off unit for CT scan in stable condition

## 2020-05-29 NOTE — Progress Notes (Signed)
  Speech Language Pathology Treatment: Dysphagia  Patient Details Name: Gary Thomas MRN: [id number] DOB: 06/07/1932 Today's Date: 05/29/2020 Time: 1000-1035 SLP Time Calculation (min) (ACUTE ONLY): 35 min  Assessment / Plan / Recommendation Clinical Impression  Skilled treatment session targeted pt's dysphagia and cognition goals. SLP facilitiated session by providing skilled observation of pt consuming puree, dysphagia 2, dysphagia 3 and thin liquids via straw. Pt's mentation was improved over yesterday. He was alert throughout session, joked with this therapist and nurse, spoke in sentences and was able to communicate basic wants and needs regarding food preferences. During this session, he consumed puree and dysphagia 2 items as well as thin liquids via straw without any overt s/s of aspiration or dysphagia. He ability to consume more advanced items within dysphagia 3 were limited by inability to wear upper dentures. This resulted in prolonged mastication. Pt was aware of this difficulty and independently commented on it prior to attempt. At this time, given limitations by dentures, recommend upgrading diet to dysphagia 2 with thin liquids via single straw sips, medicine whole in puree (or crushed in puree if needed). Pt's wife trained on how to follow aspiration precautions.   While pt was much more appropriate during today's session, he continues to experience confusion such as speaking of the past as present day. In the absence of any acute neurological findings, this is probably related to acute hospitalization (possible hospital delirium), acute metabolic encephalopathy and newly diagnosed mild dementia. At this time, pt's confusion is best targeted at next venue of care thru routines, stable familiarity with staff. ST intervention is not indicated during this acute chaotic phase of recovery. He is responsive to his wife's redirection.    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 Pt is 14 days post op I&D of deep right thigh abscess by Dr. Roland Rack. Pt presented with cough and dyspnea. ED w/u included WBC elevated and chest x-ray showed some increasing medial basilar opacities bilaterally. Pt adm for bi-basilar PNA.      SLP Plan  All goals met       Recommendations  Diet recommendations: Dysphagia 2 (fine chop);Thin liquid Liquids provided via: Straw Medication Administration: Whole meds with puree (maybe crushed in puree if needed) Supervision: Staff to assist with self feeding;Trained caregiver to feed patient Compensations: Minimize environmental distractions;Slow rate;Small sips/bites Postural Changes and/or Swallow Maneuvers: Seated upright 90 degrees;Upright 30-60 min after meal                Oral Care Recommendations: Oral care BID Follow up Recommendations: Skilled Nursing facility SLP Visit Diagnosis: Dysphagia, unspecified (R13.10);Cognitive communication deficit (R41.841) Plan: All goals met       GO                B. Rutherford Nail M.S., CCC-SLP, [place] Office [phone]  [name] 05/29/2020, 7:49 PM

## 2020-05-29 NOTE — Progress Notes (Signed)
Pt returned to unit from CT.

## 2020-05-29 NOTE — Progress Notes (Signed)
Patient ID: Gary Thomas, male   DOB: [birth year], 86 y.o.   MRN: [id number] [facility] PROGRESS NOTE  JEWETT MCGANN UKG:[id number] DOB: 1932-08-01 DOA: 2020-05-29 PCP: Marguarite Arbour, MD  HPI/Subjective: Patient did not realize why he came in.  Family states that he was not eating or drinking very well and was very confused.  As per family mental status better today than yesterday.  Also had a recent I&D on his right leg.  Had a recent hospitalization for pneumonia.  Objective: Vitals:   05/29/20 1316 05/29/20 1317  BP: (!) 155/75   Pulse: 61   Resp: 20   Temp:    SpO2: 98% 96%    Intake/Output Summary (Last 24 hours) at 05/29/2020 1403 Last data filed at 05/29/2020 0919 Gross per 24 hour  Intake --  Output 250 ml  Net -250 ml   Filed Weights   05/27/20 1648  Weight: 99.8 kg    ROS: Review of Systems  Respiratory: Negative for shortness of breath.   Cardiovascular: Negative for chest pain.  Gastrointestinal: Negative for abdominal pain.   Exam: Physical Exam HENT:     Nose: No mucosal edema.     Mouth/Throat:     Pharynx: No oropharyngeal exudate.  Eyes:     General: Lids are normal.     Extraocular Movements: Extraocular movements intact.     Conjunctiva/sclera: Conjunctivae normal.     Pupils: Pupils are equal, round, and reactive to light.  Cardiovascular:     Rate and Rhythm: Normal rate and regular rhythm.     Heart sounds: Normal heart sounds, S1 normal and S2 normal.  Pulmonary:     Breath sounds: Examination of the right-lower field reveals rhonchi. Examination of the left-lower field reveals rhonchi. Rhonchi present. No decreased breath sounds, wheezing or rales.  Abdominal:     Palpations: Abdomen is soft.     Tenderness: There is no abdominal tenderness.  Musculoskeletal:     Right ankle: Swelling present.     Left ankle: Swelling present.  Skin:    General: Skin is warm.     Findings: No rash.  Neurological:     Mental Status: He  is alert and oriented to person, place, and time.     Comments: Unable to lift right leg up off bed.  Barely able to lift left leg up off the bed       Data Reviewed: Basic Metabolic Panel: Recent Labs  Lab 05/27/20 1722 05/29/20 0434  NA 142 144  K 4.2 4.0  CL 103 107  CO2 28 29  GLUCOSE 139* 106*  BUN 24* 19  CREATININE 1.10 1.00  CALCIUM 9.1 9.2   Liver Function Tests: Recent Labs  Lab 05/27/20 1722  AST 29  ALT 25  ALKPHOS 74  BILITOT 0.9  PROT 6.5  ALBUMIN 3.1*   CBC: Recent Labs  Lab 05/27/20 1722 05/29/20 0434  WBC 11.1* 8.5  HGB 12.6* 10.7*  HCT 39.7 34.0*  MCV 91.5 93.2  PLT 127* 98*   BNP (last 3 results) Recent Labs    12/29/19 1303  BNP 104.0*      Recent Results (from the past 240 hour(s))  SARS Coronavirus 2 by RT PCR (hospital order, performed in [facility] hospital lab) Nasopharyngeal Nasopharyngeal Swab     Status: None   Collection Time: 2020/05/29  4:49 AM   Specimen: Nasopharyngeal Swab  Result Value Ref Range Status   SARS Coronavirus 2 NEGATIVE NEGATIVE Final  Comment: (NOTE) SARS-CoV-2 target nucleic acids are NOT DETECTED.  The SARS-CoV-2 RNA is generally detectable in upper and lower respiratory specimens during the acute phase of infection. The lowest concentration of SARS-CoV-2 viral copies this assay can detect is 250 copies / mL. A negative result does not preclude SARS-CoV-2 infection and should not be used as the sole basis for treatment or other patient management decisions.  A negative result may occur with improper specimen collection / handling, submission of specimen other than nasopharyngeal swab, presence of viral mutation(s) within the areas targeted by this assay, and inadequate number of viral copies (<250 copies / mL). A negative result must be combined with clinical observations, patient history, and epidemiological information.  Fact Sheet for Patients:    BoilerBrush.com.cy  Fact Sheet for Healthcare Providers: https://pope.com/  This test is not yet approved or  cleared by the [place] FDA and has been authorized for detection and/or diagnosis of SARS-CoV-2 by FDA under an Emergency Use Authorization (EUA).  This EUA will remain in effect (meaning this test can be used) for the duration of the COVID-19 declaration under Section 564(b)(1) of the Act, 21 U.S.C. section 360bbb-3(b)(1), unless the authorization is terminated or revoked sooner.  Performed at [facility], [address] [place], [place] [address]      Studies: CT Head Wo Contrast  Result Date: 05/27/2020 CLINICAL DATA:  Altered level of consciousness, slurred speech, delirium EXAM: CT HEAD WITHOUT CONTRAST TECHNIQUE: Contiguous axial images were obtained from the base of the skull through the vertex without intravenous contrast. COMPARISON:  04/02/2007 FINDINGS: Brain: There progressive confluent hypodensities throughout the periventricular and subcortical white matter, consistent with age-indeterminate small vessel ischemic changes. Extensive hypodensities are also seen within the bilateral basal ganglia, more pronounced than previous. Area of chronic right frontal cortical infarct with associated encephalomalacia is seen. No acute hemorrhage. Lateral ventricles are unremarkable. No acute extra-axial fluid collections. No mass effect. Vascular: No hyperdense vessel or unexpected calcification. Skull: Normal. Negative for fracture or focal lesion. Sinuses/Orbits: No acute finding. Other: None. IMPRESSION: 1. Progressive age-indeterminate small vessel ischemic changes throughout the white matter and basal ganglia. 2. Chronic right frontal cortical infarct. 3. No acute hemorrhage. Electronically Signed   By: Sharlet Salina M.D.   On: 05/27/2020 17:40   MR BRAIN WO CONTRAST  Result Date: 2020-06-07 CLINICAL DATA:   Episode of slurred speech and altered mental status on Saturday EXAM: MRI HEAD WITHOUT CONTRAST TECHNIQUE: Multiplanar, multiecho pulse sequences of the brain and surrounding structures were obtained without intravenous contrast. COMPARISON:  Head CT from yesterday.  Brain MRI 01/16/2016 FINDINGS: Brain: No acute infarction, hemorrhage, hydrocephalus, extra-axial collection or mass lesion. Confluent FLAIR hyperintensity in the cerebral white matter, pons, and deep gray nuclei. Given presence since at least 2017 this is attributed to severe chronic small vessel ischemia. Chronic lacunes are seen at the left thalamus and bilateral caudate. There has been generalized cerebral volume loss since prior. Remote anterior right frontal infarct affecting cortex. No acute hemorrhage, obstructive hydrocephalus, mass, or collection. Vascular: Preserved flow voids. Skull and upper cervical spine: No focal marrow lesion. Sinuses/Orbits: Bilateral cataract resection. Other: Motion degraded. IMPRESSION: 1. No acute finding. 2. Severe chronic small vessel ischemia. Remote right anterior frontal cortex infarct. 3. Brain atrophy since 2017. Electronically Signed   By: Marnee Spring M.D.   On: 2020-06-07 04:59   US Carotid Bilateral (at [facility] and [facility] only)  Result Date: Jun 07, 2020 CLINICAL DATA:  TIA. Previous left carotid  surgery. Hypertension, history of tobacco abuse EXAM: BILATERAL CAROTID DUPLEX ULTRASOUND TECHNIQUE: Wallace Cullens scale imaging, color Doppler and duplex ultrasound were performed of bilateral carotid and vertebral arteries in the neck. Technologist describes technically difficult study secondary to patient uncooperative and shaking. COMPARISON:  07/02/2004 by report only FINDINGS: Criteria: Quantification of carotid stenosis is based on velocity parameters that correlate the residual internal carotid diameter with NASCET-based stenosis levels, using the diameter of the distal internal carotid lumen as the denominator for  stenosis measurement. The following velocity measurements were obtained: RIGHT ICA: 183/20 cm/sec CCA: 36/7 cm/sec SYSTOLIC ICA/CCA RATIO:  5.1 ECA: Not identified LEFT ICA: 125/27 cm/sec CCA: 43/13 cm/sec SYSTOLIC ICA/CCA RATIO:  2.9 ECA: 46 cm/sec RIGHT CAROTID ARTERY: Mild plaque at the ICA origin without high-grade stenosis. Elevated peak systolic velocities in the mid ICA. Normal waveforms and color Doppler signal. RIGHT VERTEBRAL ARTERY:  Normal flow direction and waveform. LEFT CAROTID ARTERY: Left ICA graft or stent. Eccentric calcified plaque in the bulb and proximal ICA with mild stenosis. Normal waveforms and color Doppler signal. LEFT VERTEBRAL ARTERY:  Not identified IMPRESSION: 1. Left ICA postop changes without residual or recurrent high-grade stenosis. 2. Mild proximal right ICA plaque resulting in less than 50-69% diameter stenosis. 3. Flow signal not seen in the left vertebral artery. 4. Antegrade flow in the right vertebral artery. Electronically Signed   By: Corlis Leak M.D.   On: 05/23/2020 17:22   DG Chest Portable 1 View  Result Date: 06/07/2020 CLINICAL DATA:  Cough, altered mental status, possible stroke EXAM: PORTABLE CHEST 1 VIEW COMPARISON:  Radiograph 05/01/2020, CT 05/01/2020 FINDINGS: Chronic elevation of the right hemidiaphragm with some adjacent scarring and or atelectasis. Some increasing medial basilar opacities bilaterally may reflect worsening atelectasis or developing infection. No visible pneumothorax or effusion. Stable surgical material along the right brachiocephalic vasculature. Additional surgical clips in the left upper quadrant cardiomediastinal contours are unchanged from prior. No acute osseous or soft tissue abnormality. Degenerative changes are present in the imaged spine and shoulders. Telemetry leads overlie the chest. IMPRESSION: 1. Some increasing medial basilar opacities bilaterally may reflect worsening atelectasis or developing infection. 2. Chronic  elevation of the right hemidiaphragm with some adjacent scarring and or atelectasis. Electronically Signed   By: Kreg Shropshire M.D.   On: 05/20/2020 03:09   ECHOCARDIOGRAM COMPLETE  Result Date: 05/18/2020    ECHOCARDIOGRAM REPORT   Patient Name:   TRAMAR BRUECKNER Besson Date of Exam: 05/26/2020 Medical Rec #:  [id number]        Height:       69.0 in Accession #:    [id number]       Weight:       220.0 lb Date of Birth:  Dec 17, 1931        BSA:          2.151 m Patient Age:    88 years         BP:           162/73 mmHg Patient Gender: M                HR:           75 bpm. Exam Location:  [facility] Procedure: 2D Echo, Cardiac Doppler and Color Doppler Indications:     TIA 435.9  History:         Patient has no prior history of Echocardiogram examinations.  Stroke; Risk Factors:Hypertension and Diabetes.  Sonographer:     Cristela BlueJerry Hege RDCS (AE) Referring Phys:  [id number] Vernetta HoneyJAN A MANSY Diagnosing Phys: Cristal Deerhristopher End MD  Sonographer Comments: Technically difficult study due to poor echo windows, no apical window and suboptimal parasternal window. Image acquisition challenging due to respiratory motion. IMPRESSIONS  1. Left ventricular ejection fraction, by estimation, is >55%. The left ventricle has normal function. Left ventricular endocardial border not optimally defined to evaluate regional wall motion. There is mild left ventricular hypertrophy. Left ventricular diastolic function could not be evaluated.  2. Right ventricular systolic function was not well visualized. The right ventricular size is not well visualized.  3. The mitral valve is grossly normal. Unable to accurately assess mitral valve regurgitation.  4. Tricuspid valve regurgitation not well assessed.  5. The aortic valve was not well visualized. Aortic valve regurgitation not well assessed.  6. Pulmonic valve regurgitation not well assessed. FINDINGS  Left Ventricle: Left ventricular ejection fraction, by estimation, is >55%. The left ventricle has  normal function. Left ventricular endocardial border not optimally defined to evaluate regional wall motion. The left ventricular internal cavity size was  normal in size. There is mild left ventricular hypertrophy. Left ventricular diastolic function could not be evaluated. Right Ventricle: The right ventricular size is not well visualized. Right vetricular wall thickness was not assessed. Right ventricular systolic function was not well visualized. Left Atrium: Left atrial size was not well visualized. Right Atrium: Right atrial size was not well visualized. Pericardium: The pericardium was not well visualized. Mitral Valve: The mitral valve is grossly normal. Unable to accurately assess mitral valve regurgitation. Tricuspid Valve: The tricuspid valve is not well visualized. Tricuspid valve regurgitation not well assessed. Aortic Valve: The aortic valve was not well visualized. Aortic valve regurgitation not well assessed. Pulmonic Valve: The pulmonic valve was not well visualized. Pulmonic valve regurgitation not well assessed. Aorta: The aortic root is normal in size and structure. Venous: The inferior vena cava was not well visualized. IAS/Shunts: The interatrial septum was not well visualized.  LEFT VENTRICLE PLAX 2D LVIDd:         3.74 cm LVIDs:         2.10 cm LV PW:         1.31 cm LV IVS:        1.34 cm LVOT diam:     2.10 cm LVOT Area:     3.46 cm  LEFT ATRIUM         Index LA diam:    2.90 cm 1.35 cm/m   AORTA Ao Root diam: 3.50 cm  SHUNTS Systemic Diam: 2.10 cm Yvonne Kendallhristopher End MD Electronically signed by Yvonne Kendallhristopher End MD Signature Date/Time: 05/24/2020/3:22:04 PM    Final     Scheduled Meds: .  stroke: mapping our early stages of recovery book   Does not apply Once  . amLODipine  5 mg Oral Daily  . atorvastatin  20 mg Oral q1800  . cloNIDine  0.2 mg Transdermal Weekly  . donepezil  5 mg Oral QHS  . enoxaparin (LOVENOX) injection  40 mg Subcutaneous Q24H  . fluticasone  2 spray Each Nare  Daily  . furosemide  40 mg Oral Q M,W,F  . linezolid  600 mg Oral BID  . megestrol  400 mg Oral Daily  . metoprolol succinate  100 mg Oral Daily  . mirtazapine  15 mg Oral QHS  . multivitamin-lutein  1 capsule Oral Daily  . sertraline  100 mg  Oral QHS  . tiotropium  18 mcg Inhalation Daily  . [START ON 05/31/2020] Vitamin D (Ergocalciferol)  50,000 Units Oral Q Fri   Continuous Infusions: . sodium chloride    . azithromycin 500 mg (05/29/20 1112)    Assessment/Plan:  1. Bibasilar pneumonia.  Patient had a recent hospitalization for pneumonia.  We will get more aggressive with antibiotics and use Maxipime and oral Zithromax. 2. Acute metabolic encephalopathy.  MRI does not show any acute strokes.  Mild dementia on Aricept. 3. Accelerated hypertension.  Initially did not pass swallow evaluation now passed swallow evaluation can give oral medications.  I ordered clonidine patch earlier. 4. Recent right leg infection status post I&D by Dr. Joice Lofts on oral Zyvox. 5. Hyperlipidemia on atorvastatin 6. Weakness physical therapy evaluation 7.   We will get ultrasound of the lower extremity 8.   Nocturnal hypoxia.  On oxygen at night    Code Status:     Code Status Orders  (From admission, onward)         Start     Ordered   06-23-2020 0552  Full code  Continuous        2020/06/23 0557        Code Status History    Date Active Date Inactive Code Status Order ID Comments User Context   05/14/2020 1949 05/17/2020 1954 Full Code [id number]  Christena Flake, MD Inpatient   05/01/2020 1650 05/04/2020 2102 Full Code [id number]  Alford Highland, MD ED   12/29/2019 1519 01/03/2020 0204 Full Code [id number]  Lynn Ito, MD ED   11/17/2019 1659 11/20/2019 2019 Full Code [id number]  Lurene Shadow, MD ED   02/08/2019 0010 02/10/2019 1801 Full Code [id number]  Mayo, Allyn Kenner, MD Inpatient   10/19/2018 1055 10/21/2018 2322 Full Code [id number]  Milagros Loll, MD Inpatient   10/18/2018 2058 10/19/2018 1055 DNR [id number]   Milagros Loll, MD Inpatient   10/18/2018 1925 10/18/2018 2057 Full Code [id number]  Poggi, Excell Seltzer, MD Inpatient   10/17/2018 1423 10/18/2018 1826 Full Code [id number]  Poggi, Excell Seltzer, MD Inpatient   10/06/2018 1640 10/10/2018 1849 Full Code [id number]  Katha Hamming, MD ED   08/28/2018 1334 08/30/2018 1711 Full Code [id number]  Houston Siren, MD Inpatient   08/23/2018 0125 08/26/2018 2201 Full Code [id number]  Cammy Copa, MD Inpatient   02/15/2018 2012 02/21/2018 1700 Full Code [id number]  Maeola Harman, MD Inpatient   03/02/2017 1938 03/05/2017 1439 Full Code [id number]  Kennedy Bucker, MD Inpatient   Advance Care Planning Activity    Advance Directive Documentation     Most Recent Value  Type of Advance Directive Healthcare Power of Attorney, Living will  Pre-existing out of facility DNR order (yellow form or pink MOST form) --  "MOST" Form in Place? --     Family Communication: Family at bedside Disposition Plan: Status is: Inpatient  Dispo: The patient is from: Home              Anticipated d/c is to: Home              Anticipated d/c date is: Likely will need another day or so of IV antibiotics.              Patient currently being treated for pneumonia with altered mental status  Antibiotics:  Change antibiotics to Maxipime  Oral Zithromax  Oral Zyvox  Time spent: 28 minutes  [employer]

## 2020-05-29 NOTE — Consult Note (Signed)
Pharmacy Antibiotic Note  Gary Thomas is a 86 y.o. male admitted on 05/27/2020 with pneumonia.  Pharmacy has been consulted for cefepime dosing. Patient is also on linezolid (prescribed 8/6 x 14d) for MRSA right thigh abscess s/p I&D on 05/14/20.   Plan: Cefepime 2 g IV q8h  Patient is borderline renal dose adjustment. Scr today of 1 appears better than baseline of ~1.2. Will monitor close and adjust as indicated.   Weight: 99.8 kg (220 lb)  Temp (24hrs), Avg:98.2 F (36.8 C), Min:97.6 F (36.4 C), Max:98.8 F (37.1 C)  Recent Labs  Lab 05/27/20 1722 05/24/2020 0449 05/29/20 0434  WBC 11.1*  --  8.5  CREATININE 1.10  --  1.00  LATICACIDVEN  --  0.8  --     Estimated Creatinine Clearance: 61.7 mL/min (by C-G formula based on SCr of 1 mg/dL).    Allergies  Allergen Reactions  . Butorphanol Anaphylaxis  . Codeine Anaphylaxis  . Morphine Anaphylaxis  . Gabapentin Other (See Comments)    Unsure of what the reaction was   . Lyrica [Pregabalin] Rash  . Tapentadol Nausea Only    ONSET 01/04/2013  . Tramadol     Hallucinations "I see giant spiders on ceiling". Pt also believes he is in [facility], but he is in [place].    Antimicrobials this admission: Vancomycin 8/17 x 1 Azithromycin 8/17 >>  Ceftriaxone 8/17 >> 8/18 Cefepime 8/17 >> 8/17, 8/18 >> Linezolid 8/6 >>   Dose adjustments this admission: n/a  Microbiology results: Cultures pending  Thank you for allowing pharmacy to be a part of this patient's care.  Tressie Ellis 05/29/2020 2:29 PM

## 2020-05-29 NOTE — Progress Notes (Signed)
OT Cancellation Note  Patient Details Name: Gary Thomas MRN: [id number] DOB: 1932-10-08   Cancelled Treatment:    Reason Eval/Treat Not Completed: Patient at procedure or test/ unavailable  Pt out of his room at this time per chart to assess for LE DVT. OT to follow up for treatment as pt becomes available/as able. Thank you.  Rejeana Brock, MS, OTR/L ascom [phone] 05/29/20, 3:39 PM

## 2020-05-30 DIAGNOSIS — G4734 Idiopathic sleep related nonobstructive alveolar hypoventilation: Secondary | ICD-10-CM

## 2020-05-30 DIAGNOSIS — T43205A Adverse effect of unspecified antidepressants, initial encounter: Secondary | ICD-10-CM

## 2020-05-30 DIAGNOSIS — R410 Disorientation, unspecified: Secondary | ICD-10-CM

## 2020-05-30 MED ORDER — HALOPERIDOL LACTATE 5 MG/ML IJ SOLN
1.0000 mg | Freq: Four times a day (QID) | INTRAMUSCULAR | Status: DC | PRN
  Administered 2020-05-31 – 2020-06-02 (×3): 1 mg via INTRAVENOUS
  Filled 2020-05-30 (×3): qty 1

## 2020-05-30 MED ORDER — QUETIAPINE FUMARATE 25 MG PO TABS
25.0000 mg | ORAL_TABLET | Freq: Every day | ORAL | Status: DC
  Administered 2020-05-30: 22:00:00 25 mg via ORAL
  Filled 2020-05-30: qty 1

## 2020-05-30 MED ORDER — CHLORHEXIDINE GLUCONATE CLOTH 2 % EX PADS
6.0000 | MEDICATED_PAD | Freq: Every day | CUTANEOUS | Status: DC
  Administered 2020-05-31 – 2020-06-02 (×3): 6 via TOPICAL

## 2020-05-30 NOTE — [place] FL2 (Signed)
[place] MEDICAID FL2 LEVEL OF CARE SCREENING TOOL     IDENTIFICATION  Patient Name: Gary Thomas Birthdate: April 24, 1932 Sex: male Admission Date (Current Location): 12-Jun-2020  [facility] and [place] Number:  Chiropodist and Address:  [facility], [address], [place], [place] [address]      Provider Number: [id number]  Attending Physician Name and Address:  Alford Highland, MD  Relative Name and Phone Number:  Ritesh Opara (wife) [phone]    Current Level of Care: Hospital Recommended Level of Care: Skilled Nursing Facility Prior Approval Number:    Date Approved/Denied:   PASRR Number: [id number]  Discharge Plan: SNF    Current Diagnoses: Patient Active Problem List   Diagnosis Date Noted   Acute delirium    Acute metabolic encephalopathy    MRSA infection    Weakness    CAP (community acquired pneumonia) June 12, 2020   Abscess of right thigh 05/14/2020   Nocturnal hypoxia    Acute kidney injury superimposed on CKD (HCC)    Hyperkalemia    COPD exacerbation (HCC)    Hypokalemia    Pneumonia of both lower lobes due to infectious organism 12/30/2019   Lobar pneumonia (HCC) 12/29/2019   Community acquired pneumonia 11/17/2019   Acute hypoxemic respiratory failure (HCC) 11/17/2019   GI bleed 02/07/2019   Postoperative wound infection 10/17/2018   Supracondylar fracture of distal end of femur without intracondylar extension (HCC) 10/17/2018   Sepsis (HCC) 10/06/2018   Chronic pulmonary embolism (HCC) 09/06/2018   Primary osteoarthritis of right knee 09/06/2018   Status post right knee replacement 09/06/2018   Dyslipidemia associated with type 2 diabetes mellitus (HCC) 09/06/2018   CKD stage 3 due to type 2 diabetes mellitus (HCC) 09/06/2018   Type 2 diabetes mellitus with hyperlipidemia (HCC) 09/06/2018   Chronic cerebrovascular accident (CVA) 09/06/2018   GERD without esophagitis 09/06/2018    Closed nondisplaced supracondylar fracture of distal end of femur without intracondylar extension (HCC) 09/06/2018   Acute on chronic anemia 09/06/2018   Depression, major, single episode, in partial remission (HCC) 09/06/2018   Bilateral lower extremity edema 09/06/2018   Chronic gout due to renal impairment 09/06/2018   Acute respiratory failure with hypoxia (HCC) 08/28/2018   Spondylolisthesis of lumbar region 02/15/2018   PAD (peripheral artery disease) (HCC) 08/19/2017   Hyperlipidemia 07/05/2017   Accelerated hypertension 07/05/2017   Bilateral carotid artery stenosis 07/05/2017   Primary localized osteoarthritis of right hip 03/02/2017    Orientation RESPIRATION BLADDER Height & Weight     Self  O2 Urostomy Weight: 99.8 kg Height:     BEHAVIORAL SYMPTOMS/MOOD NEUROLOGICAL BOWEL NUTRITION STATUS      Incontinent Diet (see discharge summary)  AMBULATORY STATUS COMMUNICATION OF NEEDS Skin   Extensive Assist Verbally Surgical wounds, Other (Comment) (right thigh incision)                       Personal Care Assistance Level of Assistance  Total care, Bathing, Feeding, Dressing Bathing Assistance: Maximum assistance Feeding assistance: Maximum assistance Dressing Assistance: Maximum assistance Total Care Assistance: Maximum assistance   Functional Limitations Info             SPECIAL CARE FACTORS FREQUENCY  PT (By licensed PT), OT (By licensed OT)     PT Frequency: 5 times per week OT Frequency: 5 times per week            Contractures Contractures Info: Not present    Additional  Factors Info  Code Status, Allergies Code Status Info: Full Allergies Info: Butorphanol, codeine, morphine, gabapentin, lyrica, tapentadol, tramadol           Current Medications (05/30/2020):  This is the current hospital active medication list Current Facility-Administered Medications  Medication Dose Route Frequency Provider Last Rate Last Admin     stroke: mapping our early stages of recovery book   Does not apply Once Mansy, Jan A, MD       0.9 %  sodium chloride infusion   Intravenous Continuous Alford Highland, MD   Paused at 05/30/20 1520   acetaminophen (TYLENOL) tablet 650 mg  650 mg Oral Q6H PRN Mansy, Jan A, MD       Or   acetaminophen (TYLENOL) suppository 650 mg  650 mg Rectal Q6H PRN Mansy, Jan A, MD       amLODipine (NORVASC) tablet 5 mg  5 mg Oral Daily Wieting, Richard, MD   5 mg at 05/30/20 1105   atorvastatin (LIPITOR) tablet 20 mg  20 mg Oral q1800 Mansy, Jan A, MD       azithromycin (ZITHROMAX) tablet 250 mg  250 mg Oral Daily Wieting, Richard, MD   250 mg at 05/30/20 1105   ceFEPIme (MAXIPIME) 2 g in sodium chloride 0.9 % 100 mL IVPB  2 g Intravenous Q8H Dorothea Ogle B, RPH 200 mL/hr at 05/30/20 1520 2 g at 05/30/20 1520   [START ON 05/31/2020] Chlorhexidine Gluconate Cloth 2 % PADS 6 each  6 each Topical Q0600 Wieting, Richard, MD       cloNIDine (CATAPRES - Dosed in mg/24 hr) patch 0.2 mg  0.2 mg Transdermal Weekly Wieting, Richard, MD   0.2 mg at 05/29/20 0945   donepezil (ARICEPT) tablet 5 mg  5 mg Oral QHS Mansy, Jan A, MD   5 mg at 05/29/20 2047   enoxaparin (LOVENOX) injection 40 mg  40 mg Subcutaneous Q24H Mansy, Jan A, MD   40 mg at 05/30/20 1105   fluticasone (FLONASE) 50 MCG/ACT nasal spray 2 spray  2 spray Each Nare Daily Mansy, Jan A, MD       furosemide (LASIX) tablet 40 mg  40 mg Oral Q M,W,F Mansy, Jan A, MD       haloperidol lactate (HALDOL) injection 1 mg  1 mg Intravenous Q6H PRN Wieting, Richard, MD       labetalol (NORMODYNE) injection 10 mg  10 mg Intravenous Q3H PRN Alford Highland, MD   10 mg at 05/29/20 1037   linezolid (ZYVOX) tablet 600 mg  600 mg Oral BID Alford Highland, MD   600 mg at 05/30/20 1106   magnesium hydroxide (MILK OF MAGNESIA) suspension 30 mL  30 mL Oral Daily PRN Mansy, Jan A, MD       megestrol (MEGACE) 400 MG/10ML suspension 400 mg  400 mg Oral Daily  Mansy, Jan A, MD   400 mg at 05/30/20 1106   metoprolol succinate (TOPROL-XL) 24 hr tablet 100 mg  100 mg Oral Daily Mansy, Jan A, MD   100 mg at 05/30/20 1104   mirtazapine (REMERON) tablet 15 mg  15 mg Oral QHS Mansy, Jan A, MD   15 mg at 05/29/20 2048   multivitamin-lutein (OCUVITE-LUTEIN) capsule 1 capsule  1 capsule Oral Daily Mansy, Jan A, MD   1 capsule at 05/30/20 1107   ondansetron (ZOFRAN) tablet 4 mg  4 mg Oral Q6H PRN Mansy, Vernetta Honey, MD       Or  ondansetron (ZOFRAN) injection 4 mg  4 mg Intravenous Q6H PRN Mansy, Jan A, MD       QUEtiapine (SEROQUEL) tablet 25 mg  25 mg Oral QHS Wieting, Richard, MD       sertraline (ZOLOFT) tablet 100 mg  100 mg Oral QHS Mansy, Jan A, MD   100 mg at 05/29/20 2047   tiotropium [facility]) inhalation capsule ([facility] use ONLY) 18 mcg  18 mcg Inhalation Daily Mansy, Jan A, MD       traZODone (DESYREL) tablet 25 mg  25 mg Oral QHS PRN Mansy, Vernetta Honey, MD       [START ON 05/31/2020] Vitamin D (Ergocalciferol) (DRISDOL) capsule 50,000 Units  50,000 Units Oral Q Fri Mansy, Vernetta Honey, MD         Discharge Medications: Please see discharge summary for a list of discharge medications.  Relevant Imaging Results:  Relevant Lab Results:   Additional Information SS# [id number]  Allayne Butcher, RN

## 2020-05-30 NOTE — TOC Initial Note (Signed)
Transition of Care [facility]) - Initial/Assessment Note    Patient Details  Name: Gary Thomas MRN: [id number] Date of Birth: [birth year]  [facility] CM/SW Contact:    Allayne Butcher, RN Phone Number: 05/30/2020, 4:27 PM  Clinical Narrative:                 Patient is admitted to the hospital with community acquired pneumonia.  Patient is from home where he lives with his wife, [name].  [name] reports that patient is declining and unless he gets stronger she can't care for him at home.  [name] agrees to short term rehab, the patient is confused and unable to make decisions at this time.  Patient has equipment at home, hospital bed, walker, wheelchair, oxygen that he wears at night.   Wife prefers [employer] for SNF, bed search has been started. Once bed offers have been received, TOC team will review them with the wife.    Expected Discharge Plan: Skilled Nursing Facility Barriers to Discharge: Continued Medical Work up   Patient Goals and CMS Choice Patient states their goals for this hospitalization and ongoing recovery are:: wife would like for the patient to go for short term reahab CMS Medicare.gov Compare Post Acute Care list provided to:: Patient Represenative (must comment) Choice offered to / list presented to : Spouse  Expected Discharge Plan and Services Expected Discharge Plan: Skilled Nursing Facility   Discharge Planning Services: CM Consult Post Acute Care Choice: Durable Medical Equipment (hospital bed, wheelchair, walker, oxygen) Living arrangements for the past 2 months: Single Family Home                                      Prior Living Arrangements/Services Living arrangements for the past 2 months: Single Family Home Lives with:: Spouse Patient language and need for interpreter reviewed:: Yes Do you feel safe going back to the place where you live?: Yes      Need for Family Participation in Patient Care: Yes (Comment) (progressive  weakness and debility) Care giver support system in place?: Yes (comment) (wife) Current home services: DME Criminal Activity/Legal Involvement Pertinent to Current Situation/Hospitalization: No - Comment as needed  Activities of Daily Living Home Assistive Devices/Equipment: Eyeglasses, Dentures (specify type), Oxygen, Ostomy supplies ADL Screening (condition at time of admission) Patient's cognitive ability adequate to safely complete daily activities?: Yes Is the patient deaf or have difficulty hearing?: No Does the patient have difficulty seeing, even when wearing glasses/contacts?: No Does the patient have difficulty concentrating, remembering, or making decisions?: No Patient able to express need for assistance with ADLs?: Yes Does the patient have difficulty dressing or bathing?: Yes Independently performs ADLs?: No Communication: Independent Dressing (OT): Needs assistance Is this a change from baseline?: Pre-admission baseline Grooming: Needs assistance Is this a change from baseline?: Pre-admission baseline Feeding: Independent Bathing: Needs assistance Is this a change from baseline?: Pre-admission baseline Toileting: Needs assistance Is this a change from baseline?: Pre-admission baseline In/Out Bed: Needs assistance Is this a change from baseline?: Pre-admission baseline Walks in Home: Dependent Is this a change from baseline?: Pre-admission baseline Does the patient have difficulty walking or climbing stairs?: Yes Weakness of Legs: Both Weakness of Arms/Hands: None  Permission Sought/Granted Permission sought to share information with : Case Manager, Magazine features editor, Family Supports Permission granted to share information with : Yes, Verbal Permission Granted  Share Information with  NAME: [name]  Permission granted to share info w AGENCY: SNF  Permission granted to share info w Relationship: wife     Emotional Assessment Appearance:: Appears  stated age Attitude/Demeanor/Rapport: Unable to Assess Affect (typically observed): Unable to Assess Orientation: : Oriented to Self Alcohol / Substance Use: Not Applicable Psych Involvement: No (comment)  Admission diagnosis:  TIA (transient ischemic attack) [G45.9] Encephalopathy acute [G93.40] Demand ischemia (HCC) [I24.8] CAP (community acquired pneumonia) [J18.9] Acute respiratory failure with hypoxia (HCC) [J96.01] HCAP (healthcare-associated pneumonia) [J18.9] Patient Active Problem List   Diagnosis Date Noted  . Acute delirium   . Acute metabolic encephalopathy   . MRSA infection   . Weakness   . CAP (community acquired pneumonia) 05/26/2020  . Abscess of right thigh 05/14/2020  . Nocturnal hypoxia   . Acute kidney injury superimposed on CKD (HCC)   . Hyperkalemia   . COPD exacerbation (HCC)   . Hypokalemia   . Pneumonia of both lower lobes due to infectious organism 12/30/2019  . Lobar pneumonia (HCC) 12/29/2019  . Community acquired pneumonia 11/17/2019  . Acute hypoxemic respiratory failure (HCC) 11/17/2019  . GI bleed 02/07/2019  . Postoperative wound infection 10/17/2018  . Supracondylar fracture of distal end of femur without intracondylar extension (HCC) 10/17/2018  . Sepsis (HCC) 10/06/2018  . Chronic pulmonary embolism (HCC) 09/06/2018  . Primary osteoarthritis of right knee 09/06/2018  . Status post right knee replacement 09/06/2018  . Dyslipidemia associated with type 2 diabetes mellitus (HCC) 09/06/2018  . CKD stage 3 due to type 2 diabetes mellitus (HCC) 09/06/2018  . Type 2 diabetes mellitus with hyperlipidemia (HCC) 09/06/2018  . Chronic cerebrovascular accident (CVA) 09/06/2018  . GERD without esophagitis 09/06/2018  . Closed nondisplaced supracondylar fracture of distal end of femur without intracondylar extension (HCC) 09/06/2018  . Acute on chronic anemia 09/06/2018  . Depression, major, single episode, in partial remission (HCC) 09/06/2018  .  Bilateral lower extremity edema 09/06/2018  . Chronic gout due to renal impairment 09/06/2018  . Acute respiratory failure with hypoxia (HCC) 08/28/2018  . Spondylolisthesis of lumbar region 02/15/2018  . PAD (peripheral artery disease) (HCC) 08/19/2017  . Hyperlipidemia 07/05/2017  . Accelerated hypertension 07/05/2017  . Bilateral carotid artery stenosis 07/05/2017  . Primary localized osteoarthritis of right hip 03/02/2017   PCP:  Marguarite Arbour, MD Pharmacy:   [facility] [phone] [place], [place] - [address] [place] [address] Phone: [phone] Fax: [phone]  [facility] - [address] [place], [place] - [address] [place] [address] Phone: [phone] Fax: [phone]     Social Determinants of Health (SDOH) Interventions    Readmission Risk Interventions Readmission Risk Prevention Plan 02/10/2019  Transportation Screening Complete  Medication Review (RN Care Manager) Complete  PCP or Specialist appointment within 3-5 days of discharge Complete  HRI or Home Care Consult Complete  SW Recovery Care/Counseling Consult Complete  Palliative Care Screening Not Applicable  Skilled Nursing Facility Patient Refused  Some recent data might be hidden

## 2020-05-30 NOTE — Progress Notes (Signed)
Patient ID: Gary Thomas, male   DOB: 06/10/1932, 86 y.o.   MRN: [id number] [facility] PROGRESS NOTE  Gary Thomas:[id number] DOB: July 26, 1932 DOA: 2020-05-30 PCP: Gary Arbour, MD  HPI/Subjective: Patient does have some periods of confusion but answered questions when I was with him appropriately.  The patient's wife said they are having more difficulty taking care of him at home.  Patient also being fixated on certain things at home.  Objective: Vitals:   05/30/20 0957 05/30/20 1328  BP: (!) 151/81 (!) 160/92  Pulse: 94 85  Resp: (!) 25 19  Temp: 98 F (36.7 C) 97.7 F (36.5 C)  SpO2: 97% 93%    Intake/Output Summary (Last 24 hours) at 05/30/2020 1551 Last data filed at 05/30/2020 1520 Gross per 24 hour  Intake 1134.84 ml  Output 1030 ml  Net 104.84 ml   Filed Weights   05/27/20 1648  Weight: 99.8 kg    ROS: Review of Systems  Respiratory: Negative for shortness of breath.   Cardiovascular: Negative for chest pain.  Gastrointestinal: Negative for abdominal pain.   Exam: Physical Exam HENT:     Nose: No mucosal edema.     Mouth/Throat:     Pharynx: No oropharyngeal exudate.  Eyes:     General: Lids are normal.     Extraocular Movements: Extraocular movements intact.     Conjunctiva/sclera: Conjunctivae normal.     Pupils: Pupils are equal, round, and reactive to light.  Cardiovascular:     Rate and Rhythm: Normal rate and regular rhythm.     Heart sounds: Normal heart sounds, S1 normal and S2 normal.  Pulmonary:     Breath sounds: Examination of the right-lower field reveals decreased breath sounds. Examination of the left-lower field reveals decreased breath sounds. Decreased breath sounds present. No wheezing, rhonchi or rales.  Abdominal:     Palpations: Abdomen is soft.     Tenderness: There is no abdominal tenderness.  Musculoskeletal:     Right lower leg: Swelling present.     Left lower leg: Swelling present.  Skin:    General:  Skin is warm.     Findings: No rash.  Neurological:     Mental Status: He is alert.     Comments: Follow some simple commands.       Data Reviewed: Basic Metabolic Panel: Recent Labs  Lab 05/27/20 1722 05/29/20 0434  NA 142 144  K 4.2 4.0  CL 103 107  CO2 28 29  GLUCOSE 139* 106*  BUN 24* 19  CREATININE 1.10 1.00  CALCIUM 9.1 9.2   Liver Function Tests: Recent Labs  Lab 05/27/20 1722  AST 29  ALT 25  ALKPHOS 74  BILITOT 0.9  PROT 6.5  ALBUMIN 3.1*   CBC: Recent Labs  Lab 05/27/20 1722 05/29/20 0434  WBC 11.1* 8.5  HGB 12.6* 10.7*  HCT 39.7 34.0*  MCV 91.5 93.2  PLT 127* 98*   BNP (last 3 results) Recent Labs    12/29/19 1303  BNP 104.0*     Recent Results (from the past 240 hour(s))  SARS Coronavirus 2 by RT PCR (hospital order, performed in [facility] hospital lab) Nasopharyngeal Nasopharyngeal Swab     Status: None   Collection Time: 30-May-2020  4:49 AM   Specimen: Nasopharyngeal Swab  Result Value Ref Range Status   SARS Coronavirus 2 NEGATIVE NEGATIVE Final    Comment: (NOTE) SARS-CoV-2 target nucleic acids are NOT DETECTED.  The SARS-CoV-2 RNA is  generally detectable in upper and lower respiratory specimens during the acute phase of infection. The lowest concentration of SARS-CoV-2 viral copies this assay can detect is 250 copies / mL. A negative result does not preclude SARS-CoV-2 infection and should not be used as the sole basis for treatment or other patient management decisions.  A negative result may occur with improper specimen collection / handling, submission of specimen other than nasopharyngeal swab, presence of viral mutation(s) within the areas targeted by this assay, and inadequate number of viral copies (<250 copies / mL). A negative result must be combined with clinical observations, patient history, and epidemiological information.  Fact Sheet for Patients:   BoilerBrush.com.cy  Fact Sheet for  Healthcare Providers: https://pope.com/  This test is not yet approved or  cleared by the [place] FDA and has been authorized for detection and/or diagnosis of SARS-CoV-2 by FDA under an Emergency Use Authorization (EUA).  This EUA will remain in effect (meaning this test can be used) for the duration of the COVID-19 declaration under Section 564(b)(1) of the Act, 21 U.S.C. section 360bbb-3(b)(1), unless the authorization is terminated or revoked sooner.  Performed at [facility], [address] [place], [place] [address]   CULTURE, BLOOD (ROUTINE X 2) w Reflex to ID Panel     Status: None (Preliminary result)   Collection Time: 05/29/20  4:15 PM   Specimen: BLOOD  Result Value Ref Range Status   Specimen Description BLOOD BLOOD RIGHT HAND  Final   Special Requests   Final    BOTTLES DRAWN AEROBIC AND ANAEROBIC Blood Culture adequate volume   Culture   Final    NO GROWTH < 24 HOURS Performed at [facility], [address] [place], [place] [address]    Report Status PENDING  Incomplete  CULTURE, BLOOD (ROUTINE X 2) w Reflex to ID Panel     Status: None (Preliminary result)   Collection Time: 05/29/20  4:29 PM   Specimen: BLOOD  Result Value Ref Range Status   Specimen Description BLOOD BLOOD LEFT HAND  Final   Special Requests   Final    BOTTLES DRAWN AEROBIC AND ANAEROBIC Blood Culture adequate volume   Culture   Final    NO GROWTH < 24 HOURS Performed at [facility], [address], [place], [place] [address]    Report Status PENDING  Incomplete     Studies: CT ANGIO NECK W OR WO CONTRAST  Result Date: 05/29/2020 CLINICAL DATA:  Carotid stenosis screening. EXAM: CT ANGIOGRAPHY NECK TECHNIQUE: Multidetector CT imaging of the neck was performed using the standard protocol during bolus administration of intravenous contrast. Multiplanar CT image reconstructions and MIPs were obtained to evaluate the vascular  anatomy. Carotid stenosis measurements (when applicable) are obtained utilizing NASCET criteria, using the distal internal carotid diameter as the denominator. CONTRAST:  53mL OMNIPAQUE IOHEXOL 350 MG/ML SOLN COMPARISON:  Carotid Doppler 05/18/2020 FINDINGS: Aortic arch: Atherosclerotic calcification aortic arch without aneurysm. Standard branching of the arch. Proximal great vessels patent without significant stenosis. Right carotid system: Right common carotid artery widely patent. Surgical clips near the carotid bifurcation presumably from carotid endarterectomy. Approximately 25% diameter stenosis proximal right internal carotid artery. Origin of the right external carotid artery obscured by artifact from clips however the right external artery branches are small suggesting decreased flow. Left carotid system: Left common carotid artery widely patent. Surgical clips around the left carotid bifurcation presumably from carotid endarterectomy. Soft tissue thickening in the proximal left internal  carotid artery narrowing the lumen by 30% diameter stenosis. There is a severe stenosis of the proximal external carotid artery which is patent. Vertebral arteries: Right vertebral artery dominant and patent to the basilar without stenosis. 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 The cervical vertebral artery is diffusely disease with multiple areas of Ca severe stenosis. There is reconstitution at C2 and then occlusion at C1. There is reconstitution at C1. There is a moderate calcific stenosis at the skull base. PICA patent bilaterally. Skeleton: Cervical spondylosis and kyphosis. No acute skeletal abnormality. Other neck: 9 mm nodule right thyroid with small calcifications. 12 mm left lower pole thyroid nodule. No further imaging necessary based on nodule size. No adenopathy in the neck. Upper chest: Patchy airspace disease superior segment left lower lobe. Mild airspace disease right  superior segment lower lobe which may be atelectasis. These findings are unchanged from prior chest CT of 12/29/2019. IMPRESSION: 1. Right carotid endarterectomy with 25% diameter stenosis proximal right internal carotid artery. There is artifact obscuring the right external carotid artery which appears to be stenotic with decreased flow. 2. Left carotid endarterectomy with 30% diameter stenosis proximal left internal carotid artery. Severe stenosis proximal left external carotid artery. 3. Right vertebral artery is dominant and patent to the basilar without stenosis 4. 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 There is reconstitution at C1 which is continuous to the basilar however there is a moderate stenosis at the skull base. 5. Bilateral thyroid nodules.  No further imaging necessary. 6. Patchy airspace disease in the superior segment of the lower lobe bilaterally. These findings are only partially evaluated on the current study. No change from 12/29/2019. Recommend follow-up chest CT in 3 months for further recommendations. Electronically Signed   By: Marlan Palau M.D.   On: 05/29/2020 15:28   US Venous Img Lower Bilateral (DVT)  Result Date: 05/29/2020 CLINICAL DATA:  Bilateral lower extremity edema for 1 week EXAM: BILATERAL LOWER EXTREMITY VENOUS DOPPLER ULTRASOUND TECHNIQUE: Gray-scale sonography with graded compression, as well as color Doppler and duplex ultrasound were performed to evaluate the lower extremity deep venous systems from the level of the common femoral vein and including the common femoral, femoral, profunda femoral, popliteal and calf veins including the posterior tibial, peroneal and gastrocnemius veins when visible. The superficial great saphenous vein was also interrogated. Spectral Doppler was utilized to evaluate flow at rest and with distal augmentation maneuvers in the common femoral, femoral and popliteal veins. COMPARISON:   None. FINDINGS: RIGHT LOWER EXTREMITY Common Femoral Vein: No evidence of thrombus. Normal compressibility, respiratory phasicity and response to augmentation. Saphenofemoral Junction: No evidence of thrombus. Normal compressibility and flow on color Doppler imaging. Profunda Femoral Vein: No evidence of thrombus. Normal compressibility and flow on color Doppler imaging. Femoral Vein: No evidence of thrombus. Normal compressibility, respiratory phasicity and response to augmentation. Popliteal Vein: No evidence of thrombus. Normal compressibility, respiratory phasicity and response to augmentation. Calf Veins: Limited assessment of the calf veins. Posterior tibial vein appears patent. Peroneal vein not well visualized. No gross acute calf DVT demonstrated. LEFT LOWER EXTREMITY Common Femoral Vein: No evidence of thrombus. Normal compressibility, respiratory phasicity and response to augmentation. Saphenofemoral Junction: No evidence of thrombus. Normal compressibility and flow on color Doppler imaging. Profunda Femoral Vein: No evidence of thrombus. Normal compressibility and flow on color Doppler imaging. Femoral Vein: No evidence of thrombus. Normal compressibility, respiratory phasicity and response to augmentation. Popliteal  Vein: No evidence of thrombus. Normal compressibility, respiratory phasicity and response to augmentation. Calf Veins: Limited assessment of the calf veins. Posterior tibial vein appears patent. Peroneal vein not well visualized. No gross acute occlusive thrombus. IMPRESSION: No evidence of significant deep venous thrombosis in either lower extremity. Limited assessment of the calf veins as above. Electronically Signed   By: Judie PetitM.  Shick M.D.   On: 05/29/2020 19:43    Scheduled Meds: .  stroke: mapping our early stages of recovery book   Does not apply Once  . amLODipine  5 mg Oral Daily  . atorvastatin  20 mg Oral q1800  . azithromycin  250 mg Oral Daily  . [START ON 05/31/2020]  Chlorhexidine Gluconate Cloth  6 each Topical Q0600  . cloNIDine  0.2 mg Transdermal Weekly  . donepezil  5 mg Oral QHS  . enoxaparin (LOVENOX) injection  40 mg Subcutaneous Q24H  . fluticasone  2 spray Each Nare Daily  . furosemide  40 mg Oral Q M,W,F  . linezolid  600 mg Oral BID  . megestrol  400 mg Oral Daily  . metoprolol succinate  100 mg Oral Daily  . mirtazapine  15 mg Oral QHS  . multivitamin-lutein  1 capsule Oral Daily  . QUEtiapine  25 mg Oral QHS  . sertraline  100 mg Oral QHS  . tiotropium  18 mcg Inhalation Daily  . [START ON 05/31/2020] Vitamin D (Ergocalciferol)  50,000 Units Oral Q Fri   Continuous Infusions: . sodium chloride Stopped (05/30/20 1520)  . ceFEPime (MAXIPIME) IV 2 g (05/30/20 1520)    Assessment/Plan:  1. Acute delirium.  As needed Haldol and will give Seroquel at night.  MRI did not show any acute strokes.  Patient does have mild dementia on Aricept. 2. Accelerated hypertension.  On clonidine patch, Norvasc and metoprolol. 3. Recent right leg infection status post I&D by Dr. Joice LoftsPoggi.  Finishing up oral Zyvox for MRSA infection tonight. 4. Pneumonia.  Patient had a recent hospitalization with pneumonia.  Placed on antibiotics but I am not sure if pneumonia is the actual reason of his acute delirium.  Procalcitonin was negative yesterday we will check again tomorrow morning if negative may end up stopping antibiotics altogether. 5. Hyperlipidemia on atorvastatin 6. Weakness.  Physical therapy recommends rehab 7. Nocturnal hypoxia on oxygen at night 8. Previous stroke with right-sided weakness      Code Status:     Code Status Orders  (From admission, onward)         Start     Ordered   06/02/2020 0552  Full code  Continuous        05/16/2020 0557        Code Status History    Date Active Date Inactive Code Status Order ID Comments User Context   05/14/2020 1949 05/17/2020 1954 Full Code [id number]  Christena FlakePoggi, John J, MD Inpatient   05/01/2020 1650  05/04/2020 2102 Full Code [id number]  Alford HighlandWieting, Fayez Sturgell, MD ED   12/29/2019 1519 01/03/2020 0204 Full Code [id number]  Lynn ItoAmery, Sahar, MD ED   11/17/2019 1659 11/20/2019 2019 Full Code [id number]  Lurene ShadowAyiku, Bernard, MD ED   02/08/2019 0010 02/10/2019 1801 Full Code [id number]  Mayo, Allyn KennerKaty Dodd, MD Inpatient   10/19/2018 1055 10/21/2018 2322 Full Code [id number]  Milagros LollSudini, Srikar, MD Inpatient   10/18/2018 2058 10/19/2018 1055 DNR [id number]  Milagros LollSudini, Srikar, MD Inpatient   10/18/2018 1925 10/18/2018 2057 Full Code [id number]  Poggi, Excell SeltzerJohn J, MD Inpatient   10/17/2018 1423  10/18/2018 1826 Full Code [id number]  Christena Flake, MD Inpatient   10/06/2018 1640 10/10/2018 1849 Full Code [id number]  Katha Hamming, MD ED   08/28/2018 1334 08/30/2018 1711 Full Code [id number]  Houston Siren, MD Inpatient   08/23/2018 0125 08/26/2018 2201 Full Code [id number]  Cammy Copa, MD Inpatient   02/15/2018 2012 02/21/2018 1700 Full Code [id number]  Maeola Harman, MD Inpatient   03/02/2017 1938 03/05/2017 1439 Full Code [id number]  Kennedy Bucker, MD Inpatient   Advance Care Planning Activity    Advance Directive Documentation     Most Recent Value  Type of Advance Directive Healthcare Power of Attorney, Living will  Pre-existing out of facility DNR order (yellow form or pink MOST form) --  "MOST" Form in Place? --     Family Communication: Wife at bedside Disposition Plan: Status is: Inpatient  Dispo: The patient is from: Home              Anticipated d/c is to: Rehab              Anticipated d/c date is: Acute delirium must clear prior to any disposition to rehab              Patient currently being treated for acute delirium.  Time spent: 28 minutes  [employer]

## 2020-05-30 NOTE — Evaluation (Signed)
Physical Therapy Evaluation Patient Details Name: Gary Thomas MRN: [id number] DOB: 1931-12-29 Today's Date: 05/30/2020   History of Present Illness  Pt is an 86 y/o M with PMH including: anxiety, depression, DM, HTN, OSA, CVA with residual R side weakness and slurred speech. Pt is 14 days post op I&D of deep right thigh abscess by Dr. Joice Lofts. Pt presented with cough and dyspnea. ED w/u included WBC elevated and chest x-ray showed some increasing medial basilar opacities bilaterally. Pt adm for bi-basilar PNA.    Clinical Impression  Pt alert, oriented to name and location as a hospital but not town/state. Disoriented to time and situation, often with tangential speech/innappropriate speech with perseveration noted (including with movements this session.). Wife at bedside confirmed pt uses slide board to transfer to [facility] at baseline, but in the last several weeks has had increased difficulty with this task, needs assistance with all ADLs.   The patient was able to move extremities against gravities (unable to lift RLE without assist, family reported this was baseline). Assisted to sit EOB with maxA. Pt fearful and expressed he did not want to sit or fall, despite repositioning and education pt actively resisting movement and attempting to return to supine. Repositioned maxAx2 for comfort in bed.  Overall the patient demonstrated deficits (see "PT Problem List") that impede the patient's functional abilities, safety, and mobility and would benefit from skilled PT intervention. Recommendation is SNF due to acute decline in functional status pending pts ability to participate in therapy.      Follow Up Recommendations SNF    Equipment Recommendations  Other (comment) (TBD at next venue of care)    Recommendations for Other Services       Precautions / Restrictions Precautions Precautions: Fall Restrictions Weight Bearing Restrictions: Yes RLE Weight Bearing: Weight bearing as  tolerated Other Position/Activity Restrictions: WBAT RLE after I&D per PT eval from 05/16/20      Mobility  Bed Mobility Overal bed mobility: Needs Assistance Bed Mobility: Supine to Sit;Sit to Supine     Supine to sit: Max assist;HOB elevated Sit to supine: Max assist;HOB elevated   General bed mobility comments: repositioned with two person assist  Transfers                 General transfer comment: deferred  Ambulation/Gait             General Gait Details: per family pt had been ambulating short distances with PT And close chair follow but has not maintained this progress.  Stairs            Wheelchair Mobility    Modified Rankin (Stroke Patients Only)       Balance Overall balance assessment: Needs assistance Sitting-balance support: Feet unsupported;Bilateral upper extremity supported Sitting balance-Leahy Scale: Zero Sitting balance - Comments: pt did not want to sit EOB, difficult to assess true balance in sitting                                     Pertinent Vitals/Pain Pain Assessment: Faces Faces Pain Scale: No hurt Pain Intervention(s): Limited activity within patient's tolerance;Monitored during session;Repositioned    Home Living Family/patient expects to be discharged to:: Private residence Living Arrangements: Spouse/significant other Available Help at Discharge: Family Type of Home: House Home Access: Ramped entrance     Home Layout: One level Home Equipment: Environmental consultant - 2 wheels;Cane - single point;Shower seat;Bedside  commode;Hospital bed;Wheelchair - IT trainer;Other (comment) Additional Comments: trapeze above bed, multiple sliding boards    Prior Function Level of Independence: Needs assistance   Gait / Transfers Assistance Needed: Pt unable to report PLOF due to confusion. Family at bedside confirms pt previously used sliding board to transfer to [facility]. Has been unable to perform for a few  weeks.  ADL's / Homemaking Assistance Needed: Wife reported assists with bathing, pericare (wears depends), and LBD. Pt able to perform light meal prep. Unclear whether there is an aide to assist with cooking/cleaning.  Comments: Pt with tangential conversation, perseveration with movements, and unable to recall situation or PLOF. Wife at bedside stated he has had increased difficulty with mobility and mentation lately.     Hand Dominance   Dominant Hand: Right    Extremity/Trunk Assessment   Upper Extremity Assessment Upper Extremity Assessment: Generalized weakness;Difficult to assess due to impaired cognition    Lower Extremity Assessment Lower Extremity Assessment: Generalized weakness;Difficult to assess due to impaired cognition (Per family pt has been unable to lift RLE independently for some time)    Cervical / Trunk Assessment Cervical / Trunk Assessment: Kyphotic  Communication   Communication: No difficulties  Cognition Arousal/Alertness: Awake/alert Behavior During Therapy: Anxious;Impulsive Overall Cognitive Status: Impaired/Different from baseline                                 General Comments: pt oriented to name and hospital (able to recall most of hospital name mid session). disoriented to time, situation. Some perservation in speech/movement noted.      General Comments      Exercises     Assessment/Plan    PT Assessment Patient needs continued PT services  PT Problem List Decreased strength;Decreased range of motion;Decreased activity tolerance;Decreased balance;Decreased mobility;Decreased knowledge of use of DME;Decreased safety awareness;Pain       PT Treatment Interventions DME instruction;Gait training;Functional mobility training;Therapeutic activities;Therapeutic exercise;Balance training;Patient/family education    PT Goals (Current goals can be found in the Care Plan section)  Acute Rehab PT Goals Patient Stated Goal: to  get stronger PT Goal Formulation: With family Time For Goal Achievement: 06/13/20 Potential to Achieve Goals: Fair    Frequency Min 2X/week   Barriers to discharge        Co-evaluation               AM-PAC PT "6 Clicks" Mobility  Outcome Measure Help needed turning from your back to your side while in a flat bed without using bedrails?: Total Help needed moving from lying on your back to sitting on the side of a flat bed without using bedrails?: Total Help needed moving to and from a bed to a chair (including a wheelchair)?: Total Help needed standing up from a chair using your arms (e.g., wheelchair or bedside chair)?: Total Help needed to walk in hospital room?: Total Help needed climbing 3-5 steps with a railing? : Total 6 Click Score: 6    End of Session Equipment Utilized During Treatment: Oxygen Activity Tolerance: Patient tolerated treatment well Patient left: with bed alarm set;with call bell/phone within reach;in bed;with SCD's reapplied;with nursing/sitter in room;with family/visitor present Nurse Communication: Mobility status PT Visit Diagnosis: Muscle weakness (generalized) (M62.81);Difficulty in walking, not elsewhere classified (R26.2);Repeated falls (R29.6);Unsteadiness on feet (R26.81);Pain Pain - Right/Left: Right Pain - part of body: Leg    Time: 1010-1039 PT Time Calculation (min) (ACUTE ONLY): 29 min  Charges:   PT Evaluation $PT Eval Moderate Complexity: 1 Mod PT Treatments $Therapeutic Exercise: 8-22 mins      Olga Coaster PT, DPT 12:43 PM,05/30/20

## 2020-05-30 NOTE — Progress Notes (Signed)
Ortho  Pa lance in to see pt and changed dressing to rt  Upper thigh using honeycomb dressing, site intact with stitches. No reddness. Site clean ,dry and intact

## 2020-05-31 DIAGNOSIS — J9601 Acute respiratory failure with hypoxia: Secondary | ICD-10-CM

## 2020-05-31 LAB — COMPREHENSIVE METABOLIC PANEL
ALT: 20 U/L (ref 0–44)
AST: 28 U/L (ref 15–41)
Albumin: 2.8 g/dL — ABNORMAL LOW (ref 3.5–5.0)
Alkaline Phosphatase: 64 U/L (ref 38–126)
Anion gap: 5 (ref 5–15)
BUN: 11 mg/dL (ref 8–23)
CO2: 27 mmol/L (ref 22–32)
Calcium: 8.8 mg/dL — ABNORMAL LOW (ref 8.9–10.3)
Chloride: 109 mmol/L (ref 98–111)
Creatinine, Ser: 0.99 mg/dL (ref 0.61–1.24)
GFR calc Af Amer: >60 mL/min (ref >60)
GFR calc non Af Amer: >60 mL/min (ref >60)
Glucose, Bld: 99 mg/dL (ref 70–99)
Potassium: 3.7 mmol/L (ref 3.5–5.1)
Sodium: 141 mmol/L (ref 135–145)
Total Bilirubin: 0.9 mg/dL (ref 0.3–1.2)
Total Protein: 6 g/dL — ABNORMAL LOW (ref 6.5–8.1)

## 2020-05-31 LAB — CBC
HCT: 33.5 % — ABNORMAL LOW (ref 39.0–52.0)
Hemoglobin: 10.7 g/dL — ABNORMAL LOW (ref 13.0–17.0)
MCH: 28.9 pg (ref 26.0–34.0)
MCHC: 31.9 g/dL (ref 30.0–36.0)
MCV: 90.5 fL (ref 80.0–100.0)
Platelets: 81 10*3/uL — ABNORMAL LOW (ref 150–400)
RBC: 3.7 MIL/uL — ABNORMAL LOW (ref 4.22–5.81)
RDW: 13.9 % (ref 11.5–15.5)
WBC: 8.5 10*3/uL (ref 4.0–10.5)
nRBC: 0 % (ref 0.0–0.2)

## 2020-05-31 LAB — PROCALCITONIN: Procalcitonin: <0.1 ng/mL

## 2020-05-31 MED ORDER — CYPROHEPTADINE HCL 4 MG PO TABS
12.0000 mg | ORAL_TABLET | Freq: Once | ORAL | Status: AC
  Administered 2020-06-01: 12 mg via ORAL
  Filled 2020-05-31 (×2): qty 3

## 2020-05-31 MED ORDER — TRAZODONE HCL 50 MG PO TABS
50.0000 mg | ORAL_TABLET | Freq: Every day | ORAL | Status: DC
  Administered 2020-05-31 – 2020-06-01 (×2): 50 mg via ORAL
  Filled 2020-05-31 (×2): qty 1

## 2020-05-31 MED ORDER — LORAZEPAM 2 MG/ML IJ SOLN
0.5000 mg | Freq: Four times a day (QID) | INTRAMUSCULAR | Status: DC | PRN
  Administered 2020-05-31: 0.5 mg via INTRAVENOUS
  Filled 2020-05-31: qty 1

## 2020-05-31 NOTE — Care Management Important Message (Signed)
Important Message  Patient Details  Name: Gary Thomas MRN: [id number] Date of Birth: 04-17-1932   Medicare Important Message Given:  Yes  Wife returned call.  Aware of Medicare IM right.     Johnell Comings 05/31/2020, 2:54 PM

## 2020-05-31 NOTE — Care Management Important Message (Signed)
Important Message  Patient Details  Name: Gary Thomas MRN: [id number] Date of Birth: [birth year]   Medicare Important Message Given:  Other (see comment)  Left message with wife to review Medicare IM.  Encouraged callback. Will attempt at later time.      Johnell Comings 05/31/2020, 2:45 PM

## 2020-05-31 NOTE — Progress Notes (Signed)
Patient ID: Gary Thomas, male   DOB: [birth year], 86 y.o.   MRN: [id number] [facility] PROGRESS NOTE  CALEB DECOCK ZDG:[id number] DOB: December 25, 1931 DOA: 06-27-20 PCP: Marguarite Arbour, MD  HPI/Subjective: Patient seen this morning with agitation and fidgeting in the bed. Tries to mouth some words but unable to get them out. As per nursing staff did not sleep much last night.  Objective: Vitals:   05/31/20 1248 05/31/20 1303  BP: (!) 148/113 (!) 183/87  Pulse: 88   Resp: (!) 21   Temp: (!) 97.5 F (36.4 C)   SpO2: 100%     Intake/Output Summary (Last 24 hours) at 05/31/2020 1358 Last data filed at 05/31/2020 1250 Gross per 24 hour  Intake 1014.84 ml  Output 2050 ml  Net -1035.16 ml   Filed Weights   05/27/20 1648  Weight: 99.8 kg    ROS: Review of Systems  Unable to perform ROS: Acuity of condition   Exam: Physical Exam HENT:     Nose: No mucosal edema.     Mouth/Throat:     Pharynx: No oropharyngeal exudate.  Eyes:     General: Lids are normal.     Conjunctiva/sclera: Conjunctivae normal.     Pupils: Pupils are equal, round, and reactive to light.  Cardiovascular:     Rate and Rhythm: Normal rate and regular rhythm.     Heart sounds: Normal heart sounds, S1 normal and S2 normal.  Pulmonary:     Breath sounds: Examination of the right-lower field reveals decreased breath sounds. Examination of the left-lower field reveals decreased breath sounds. Decreased breath sounds present. No wheezing, rhonchi or rales.  Abdominal:     Palpations: Abdomen is soft.     Tenderness: There is no abdominal tenderness.  Musculoskeletal:     Right ankle: No swelling.     Left ankle: No swelling.  Skin:    General: Skin is warm.     Findings: No lesion.  Neurological:     Mental Status: He is disoriented.     Comments: Moving his arms on his own. Response to pain on his feet. Tried to talk with him but he is delirious       Data Reviewed: Basic Metabolic  Panel: Recent Labs  Lab 05/27/20 1722 05/29/20 0434 05/31/20 0451  NA 142 144 141  K 4.2 4.0 3.7  CL 103 107 109  CO2 GLUCOSE 139* 106* 99  BUN 24* 19 11  CREATININE 1.10 1.00 0.99  CALCIUM 9.1 9.2 8.8*   Liver Function Tests: Recent Labs  Lab 05/27/20 1722 05/31/20 0451  AST 29 28  ALT 25 20  ALKPHOS 74 64  BILITOT 0.9 0.9  PROT 6.5 6.0*  ALBUMIN 3.1* 2.8*   CBC: Recent Labs  Lab 05/27/20 1722 05/29/20 0434 05/31/20 0451  WBC 11.1* 8.5 8.5  HGB 12.6* 10.7* 10.7*  HCT 39.7 34.0* 33.5*  MCV 91.5 93.2 90.5  PLT 127* 98* 81*   BNP (last 3 results) Recent Labs    12/29/19 1303  BNP 104.0*     Recent Results (from the past 240 hour(s))  SARS Coronavirus 2 by RT PCR (hospital order, performed in [facility] hospital lab) Nasopharyngeal Nasopharyngeal Swab     Status: None   Collection Time: Jun 27, 2020  4:49 AM   Specimen: Nasopharyngeal Swab  Result Value Ref Range Status   SARS Coronavirus 2 NEGATIVE NEGATIVE Final    Comment: (NOTE) SARS-CoV-2 target nucleic acids are  NOT DETECTED.  The SARS-CoV-2 RNA is generally detectable in upper and lower respiratory specimens during the acute phase of infection. The lowest concentration of SARS-CoV-2 viral copies this assay can detect is 250 copies / mL. A negative result does not preclude SARS-CoV-2 infection and should not be used as the sole basis for treatment or other patient management decisions.  A negative result may occur with improper specimen collection / handling, submission of specimen other than nasopharyngeal swab, presence of viral mutation(s) within the areas targeted by this assay, and inadequate number of viral copies (<250 copies / mL). A negative result must be combined with clinical observations, patient history, and epidemiological information.  Fact Sheet for Patients:   BoilerBrush.com.cy  Fact Sheet for Healthcare  Providers: https://pope.com/  This test is not yet approved or  cleared by the [place] FDA and has been authorized for detection and/or diagnosis of SARS-CoV-2 by FDA under an Emergency Use Authorization (EUA).  This EUA will remain in effect (meaning this test can be used) for the duration of the COVID-19 declaration under Section 564(b)(1) of the Act, 21 U.S.C. section 360bbb-3(b)(1), unless the authorization is terminated or revoked sooner.  Performed at [facility], [address] [place], [place] [address]   CULTURE, BLOOD (ROUTINE X 2) w Reflex to ID Panel     Status: None (Preliminary result)   Collection Time: 05/29/20  4:15 PM   Specimen: BLOOD  Result Value Ref Range Status   Specimen Description BLOOD BLOOD RIGHT HAND  Final   Special Requests   Final    BOTTLES DRAWN AEROBIC AND ANAEROBIC Blood Culture adequate volume   Culture   Final    NO GROWTH 2 DAYS Performed at [facility], [address], [place], [place] [address]    Report Status PENDING  Incomplete  CULTURE, BLOOD (ROUTINE X 2) w Reflex to ID Panel     Status: None (Preliminary result)   Collection Time: 05/29/20  4:29 PM   Specimen: BLOOD  Result Value Ref Range Status   Specimen Description BLOOD BLOOD LEFT HAND  Final   Special Requests   Final    BOTTLES DRAWN AEROBIC AND ANAEROBIC Blood Culture adequate volume   Culture   Final    NO GROWTH 2 DAYS Performed at [facility], [address] [place], [place] [address]    Report Status PENDING  Incomplete     Studies: CT ANGIO NECK W OR WO CONTRAST  Result Date: 05/29/2020 CLINICAL DATA:  Carotid stenosis screening. EXAM: CT ANGIOGRAPHY NECK TECHNIQUE: Multidetector CT imaging of the neck was performed using the standard protocol during bolus administration of intravenous contrast. Multiplanar CT image reconstructions and MIPs were obtained to evaluate the vascular anatomy. Carotid  stenosis measurements (when applicable) are obtained utilizing NASCET criteria, using the distal internal carotid diameter as the denominator. CONTRAST:  19mL OMNIPAQUE IOHEXOL 350 MG/ML SOLN COMPARISON:  Carotid Doppler 06/23/20 FINDINGS: Aortic arch: Atherosclerotic calcification aortic arch without aneurysm. Standard branching of the arch. Proximal great vessels patent without significant stenosis. Right carotid system: Right common carotid artery widely patent. Surgical clips near the carotid bifurcation presumably from carotid endarterectomy. Approximately 25% diameter stenosis proximal right internal carotid artery. Origin of the right external carotid artery obscured by artifact from clips however the right external artery branches are small suggesting decreased flow. Left carotid system: Left common carotid artery widely patent. Surgical clips around the left carotid bifurcation presumably from carotid endarterectomy. Soft tissue thickening  in the proximal left internal carotid artery narrowing the lumen by 30% diameter stenosis. There is a severe stenosis of the proximal external carotid artery which is patent. Vertebral arteries: Right vertebral artery dominant and patent to the basilar without stenosis. 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 The cervical vertebral artery is diffusely disease with multiple areas of Ca severe stenosis. There is reconstitution at C2 and then occlusion at C1. There is reconstitution at C1. There is a moderate calcific stenosis at the skull base. PICA patent bilaterally. Skeleton: Cervical spondylosis and kyphosis. No acute skeletal abnormality. Other neck: 9 mm nodule right thyroid with small calcifications. 12 mm left lower pole thyroid nodule. No further imaging necessary based on nodule size. No adenopathy in the neck. Upper chest: Patchy airspace disease superior segment left lower lobe. Mild airspace disease right superior segment  lower lobe which may be atelectasis. These findings are unchanged from prior chest CT of 12/29/2019. IMPRESSION: 1. Right carotid endarterectomy with 25% diameter stenosis proximal right internal carotid artery. There is artifact obscuring the right external carotid artery which appears to be stenotic with decreased flow. 2. Left carotid endarterectomy with 30% diameter stenosis proximal left internal carotid artery. Severe stenosis proximal left external carotid artery. 3. Right vertebral artery is dominant and patent to the basilar without stenosis 4. 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 There is reconstitution at C1 which is continuous to the basilar however there is a moderate stenosis at the skull base. 5. Bilateral thyroid nodules.  No further imaging necessary. 6. Patchy airspace disease in the superior segment of the lower lobe bilaterally. These findings are only partially evaluated on the current study. No change from 12/29/2019. Recommend follow-up chest CT in 3 months for further recommendations. Electronically Signed   By: Marlan Palau M.D.   On: 05/29/2020 15:28   US Venous Img Lower Bilateral (DVT)  Result Date: 05/29/2020 CLINICAL DATA:  Bilateral lower extremity edema for 1 week EXAM: BILATERAL LOWER EXTREMITY VENOUS DOPPLER ULTRASOUND TECHNIQUE: Gray-scale sonography with graded compression, as well as color Doppler and duplex ultrasound were performed to evaluate the lower extremity deep venous systems from the level of the common femoral vein and including the common femoral, femoral, profunda femoral, popliteal and calf veins including the posterior tibial, peroneal and gastrocnemius veins when visible. The superficial great saphenous vein was also interrogated. Spectral Doppler was utilized to evaluate flow at rest and with distal augmentation maneuvers in the common femoral, femoral and popliteal veins. COMPARISON:  None. FINDINGS:  RIGHT LOWER EXTREMITY Common Femoral Vein: No evidence of thrombus. Normal compressibility, respiratory phasicity and response to augmentation. Saphenofemoral Junction: No evidence of thrombus. Normal compressibility and flow on color Doppler imaging. Profunda Femoral Vein: No evidence of thrombus. Normal compressibility and flow on color Doppler imaging. Femoral Vein: No evidence of thrombus. Normal compressibility, respiratory phasicity and response to augmentation. Popliteal Vein: No evidence of thrombus. Normal compressibility, respiratory phasicity and response to augmentation. Calf Veins: Limited assessment of the calf veins. Posterior tibial vein appears patent. Peroneal vein not well visualized. No gross acute calf DVT demonstrated. LEFT LOWER EXTREMITY Common Femoral Vein: No evidence of thrombus. Normal compressibility, respiratory phasicity and response to augmentation. Saphenofemoral Junction: No evidence of thrombus. Normal compressibility and flow on color Doppler imaging. Profunda Femoral Vein: No evidence of thrombus. Normal compressibility and flow on color Doppler imaging. Femoral Vein: No evidence of thrombus. Normal compressibility, respiratory phasicity  and response to augmentation. Popliteal Vein: No evidence of thrombus. Normal compressibility, respiratory phasicity and response to augmentation. Calf Veins: Limited assessment of the calf veins. Posterior tibial vein appears patent. Peroneal vein not well visualized. No gross acute occlusive thrombus. IMPRESSION: No evidence of significant deep venous thrombosis in either lower extremity. Limited assessment of the calf veins as above. Electronically Signed   By: Judie PetitM.  Shick M.D.   On: 05/29/2020 19:43    Scheduled Meds: .  stroke: mapping our early stages of recovery book   Does not apply Once  . amLODipine  5 mg Oral Daily  . atorvastatin  20 mg Oral q1800  . Chlorhexidine Gluconate Cloth  6 each Topical Q0600  . cloNIDine  0.2 mg  Transdermal Weekly  . cyproheptadine  12 mg Oral Once  . donepezil  5 mg Oral QHS  . fluticasone  2 spray Each Nare Daily  . furosemide  40 mg Oral Q M,W,F  . megestrol  400 mg Oral Daily  . metoprolol succinate  100 mg Oral Daily  . multivitamin-lutein  1 capsule Oral Daily  . tiotropium  18 mcg Inhalation Daily  . traZODone  50 mg Oral QHS  . Vitamin D (Ergocalciferol)  50,000 Units Oral Q Fri   Continuous Infusions: . sodium chloride Stopped (05/30/20 1520)    Assessment/Plan:  1. Acute delirium. Worse today. Potential serotonin syndrome hold Remeron and Zoloft. We will give IV Ativan and cyproheptadine. Since Seroquel did not help will discontinue that also. 2. Accelerated hypertension on clonidine patch. Oral meds if able to take otherwise IV labetalol. 3. Recent right leg infection status post I&D. Finished up Zyvox for MRSA infection. 4. Procalcitonin negative so we will stop antibiotics. Potentially seeing pneumonia from previous hospitalization. 5. Hyperlipidemia unspecified. Hold atorvastatin 6. Nocturnal hypoxia on oxygen at night currently here on oxygen during the day also 7. Previous stroke with right-sided weakness    Code Status:     Code Status Orders  (From admission, onward)         Start     Ordered   05/31/20 1035  Do not attempt resuscitation (DNR)  Continuous       Question Answer Comment  In the event of cardiac or respiratory ARREST Do not call a "code blue"   In the event of cardiac or respiratory ARREST Do not perform Intubation, CPR, defibrillation or ACLS   In the event of cardiac or respiratory ARREST Use medication by any route, position, wound care, and other measures to relive pain and suffering. May use oxygen, suction and manual treatment of airway obstruction as needed for comfort.   Comments nurse may pronounce      05/31/20 1034        Code Status History    Date Active Date Inactive Code Status Order ID Comments User Context    06/07/2020 0557 05/31/2020 1034 Full Code [id number]  Hannah BeatMansy, Jan A, MD ED   05/14/2020 1949 05/17/2020 1954 Full Code [id number]  Christena FlakePoggi, John J, MD Inpatient   05/01/2020 1650 05/04/2020 2102 Full Code [id number]  Alford HighlandWieting, Sylus Stgermain, MD ED   12/29/2019 1519 01/03/2020 0204 Full Code [id number]  Lynn ItoAmery, Sahar, MD ED   11/17/2019 1659 11/20/2019 2019 Full Code [id number]  Lurene ShadowAyiku, Bernard, MD ED   02/08/2019 0010 02/10/2019 1801 Full Code [id number]  Mayo, Allyn KennerKaty Dodd, MD Inpatient   10/19/2018 1055 10/21/2018 2322 Full Code [id number]  Milagros LollSudini, Srikar, MD Inpatient   10/18/2018 2058 10/19/2018 1055 DNR [id number]  Milagros Loll, MD Inpatient   10/18/2018 1925 10/18/2018 2057 Full Code [id number]  Christena Flake, MD Inpatient   10/17/2018 1423 10/18/2018 1826 Full Code [id number]  Christena Flake, MD Inpatient   10/06/2018 1640 10/10/2018 1849 Full Code [id number]  Katha Hamming, MD ED   08/28/2018 1334 08/30/2018 1711 Full Code [id number]  Houston Siren, MD Inpatient   08/23/2018 0125 08/26/2018 2201 Full Code [id number]  Cammy Copa, MD Inpatient   02/15/2018 2012 02/21/2018 1700 Full Code [id number]  Maeola Harman, MD Inpatient   03/02/2017 1938 03/05/2017 1439 Full Code [id number]  Kennedy Bucker, MD Inpatient   Advance Care Planning Activity    Advance Directive Documentation     Most Recent Value  Type of Advance Directive Healthcare Power of Attorney, Living will  Pre-existing out of facility DNR order (yellow form or pink MOST form) --  "MOST" Form in Place? --     Family Communication: wife at bedside Disposition Plan: Status is: Inpatient  Dispo: The patient is from: Home              Anticipated d/c is to: Rehab once delirium clears              Anticipated d/c date is: Next week once delirium clears.              Patient currently being treated for acute delirium  Time spent: 28 minutes  [employer]

## 2020-05-31 NOTE — Progress Notes (Signed)
Notified Dr Renae Gloss of wife's concern for change in patient status.  Pt more restless, nonverbal, refuses breakfast. MD stated he would "be here shortly"

## 2020-05-31 NOTE — Progress Notes (Signed)
Attempted to give pt ordered meds at this time.  Pt was given applesauce initially and pt was not able to follow direction to swallow safely at this time.  Po meds unable to be administered.  To notify MD at this time

## 2020-05-31 NOTE — Plan of Care (Signed)

## 2020-06-01 DIAGNOSIS — T43205A Adverse effect of unspecified antidepressants, initial encounter: Secondary | ICD-10-CM

## 2020-06-01 LAB — CK: Total CK: 57 U/L (ref 49–397)

## 2020-06-01 LAB — CBC
HCT: 36.1 % — ABNORMAL LOW (ref 39.0–52.0)
Hemoglobin: 12 g/dL — ABNORMAL LOW (ref 13.0–17.0)
MCH: 28.9 pg (ref 26.0–34.0)
MCHC: 33.2 g/dL (ref 30.0–36.0)
MCV: 87 fL (ref 80.0–100.0)
Platelets: 77 10*3/uL — ABNORMAL LOW (ref 150–400)
RBC: 4.15 MIL/uL — ABNORMAL LOW (ref 4.22–5.81)
RDW: 14.3 % (ref 11.5–15.5)
WBC: 10 10*3/uL (ref 4.0–10.5)
nRBC: 0 % (ref 0.0–0.2)

## 2020-06-01 LAB — BASIC METABOLIC PANEL
Anion gap: 12 (ref 5–15)
BUN: 11 mg/dL (ref 8–23)
CO2: 25 mmol/L (ref 22–32)
Calcium: 8.9 mg/dL (ref 8.9–10.3)
Chloride: 105 mmol/L (ref 98–111)
Creatinine, Ser: 1.1 mg/dL (ref 0.61–1.24)
GFR calc Af Amer: >60 mL/min (ref >60)
GFR calc non Af Amer: >60 mL/min (ref >60)
Glucose, Bld: 93 mg/dL (ref 70–99)
Potassium: 3.5 mmol/L (ref 3.5–5.1)
Sodium: 142 mmol/L (ref 135–145)

## 2020-06-01 MED ORDER — LORAZEPAM 2 MG/ML IJ SOLN: 0.5000 mg | Freq: Three times a day (TID) | INTRAMUSCULAR | Status: DC

## 2020-06-01 MED ORDER — LABETALOL HCL 5 MG/ML IV SOLN
10.0000 mg | INTRAVENOUS | Status: DC | PRN
  Administered 2020-06-01 – 2020-06-02 (×2): 10 mg via INTRAVENOUS
  Filled 2020-06-01: qty 4

## 2020-06-01 MED ORDER — CYPROHEPTADINE HCL 4 MG PO TABS
4.0000 mg | ORAL_TABLET | Freq: Two times a day (BID) | ORAL | Status: DC
  Administered 2020-06-01: 4 mg via ORAL
  Filled 2020-06-01 (×3): qty 1

## 2020-06-01 MED ORDER — LORAZEPAM 2 MG/ML IJ SOLN
0.5000 mg | Freq: Four times a day (QID) | INTRAMUSCULAR | Status: DC | PRN
  Administered 2020-06-01: 0.5 mg via INTRAVENOUS
  Filled 2020-06-01: qty 1

## 2020-06-01 NOTE — Plan of Care (Signed)
  Problem: Education: Goal: Knowledge of General Education information will improve Description: Including pain rating scale, medication(s)/side effects and non-pharmacologic comfort measures 06/01/2020 0436 by Rowe Pavy, RN Outcome: Progressing 06/01/2020 0340 by Rowe Pavy, RN Outcome: Progressing 06/01/2020 0339 by Rowe Pavy, RN Outcome: Progressing   Problem: Health Behavior/Discharge Planning: Goal: Ability to manage health-related needs will improve 06/01/2020 0436 by Rowe Pavy, RN Outcome: Progressing 06/01/2020 0340 by Rowe Pavy, RN Outcome: Progressing 06/01/2020 0339 by Rowe Pavy, RN Outcome: Progressing   Problem: Clinical Measurements: Goal: Ability to maintain clinical measurements within normal limits will improve 06/01/2020 0436 by Rowe Pavy, RN Outcome: Progressing 06/01/2020 0340 by Rowe Pavy, RN Outcome: Progressing 06/01/2020 0339 by Rowe Pavy, RN Outcome: Progressing Goal: Will remain free from infection 06/01/2020 0436 by Rowe Pavy, RN Outcome: Progressing 06/01/2020 0340 by Rowe Pavy, RN Outcome: Progressing 06/01/2020 0339 by Rowe Pavy, RN Outcome: Progressing Goal: Diagnostic test results will improve 06/01/2020 0436 by Rowe Pavy, RN Outcome: Progressing 06/01/2020 0340 by Rowe Pavy, RN Outcome: Progressing 06/01/2020 0339 by Rowe Pavy, RN Outcome: Progressing Goal: Respiratory complications will improve 06/01/2020 0436 by Rowe Pavy, RN Outcome: Progressing 06/01/2020 0340 by Rowe Pavy, RN Outcome: Progressing 06/01/2020 0339 by Rowe Pavy, RN Outcome: Progressing Goal: Cardiovascular complication will be avoided 06/01/2020 0436 by Rowe Pavy, RN Outcome: Progressing 06/01/2020 0340 by Rowe Pavy, RN Outcome: Progressing 06/01/2020 0339 by Rowe Pavy,  RN Outcome: Progressing   Problem: Activity: Goal: Risk for activity intolerance will decrease 06/01/2020 0436 by Rowe Pavy, RN Outcome: Progressing 06/01/2020 0340 by Rowe Pavy, RN Outcome: Progressing 06/01/2020 0339 by Rowe Pavy, RN Outcome: Progressing   Problem: Nutrition: Goal: Adequate nutrition will be maintained 06/01/2020 0436 by Rowe Pavy, RN Outcome: Progressing 06/01/2020 0340 by Rowe Pavy, RN Outcome: Progressing 06/01/2020 0339 by Rowe Pavy, RN Outcome: Progressing   Problem: Coping: Goal: Level of anxiety will decrease 06/01/2020 0436 by Rowe Pavy, RN Outcome: Progressing 06/01/2020 0340 by Rowe Pavy, RN Outcome: Progressing 06/01/2020 0339 by Rowe Pavy, RN Outcome: Progressing   Problem: Elimination: Goal: Will not experience complications related to bowel motility 06/01/2020 0436 by Rowe Pavy, RN Outcome: Progressing 06/01/2020 0340 by Rowe Pavy, RN Outcome: Progressing 06/01/2020 0339 by Rowe Pavy, RN Outcome: Progressing Goal: Will not experience complications related to urinary retention 06/01/2020 0436 by Rowe Pavy, RN Outcome: Progressing 06/01/2020 0340 by Rowe Pavy, RN Outcome: Progressing 06/01/2020 0339 by Rowe Pavy, RN Outcome: Progressing   Problem: Pain Managment: Goal: General experience of comfort will improve 06/01/2020 0436 by Rowe Pavy, RN Outcome: Progressing 06/01/2020 0340 by Rowe Pavy, RN Outcome: Progressing 06/01/2020 0339 by Rowe Pavy, RN Outcome: Progressing   Problem: Safety: Goal: Ability to remain free from injury will improve 06/01/2020 0436 by Rowe Pavy, RN Outcome: Progressing 06/01/2020 0340 by Rowe Pavy, RN Outcome: Progressing 06/01/2020 0339 by Rowe Pavy, RN Outcome: Progressing   Problem: Skin Integrity: Goal:  Risk for impaired skin integrity will decrease 06/01/2020 0436 by Rowe Pavy, RN Outcome: Progressing 06/01/2020 0340 by Rowe Pavy, RN Outcome: Progressing 06/01/2020 0339 by Rowe Pavy, RN Outcome: Progressing

## 2020-06-01 NOTE — Progress Notes (Signed)
Patient ID: Gary Thomas, male   DOB: 09-07-1932, 86 y.o.   MRN: [id number] [facility] PROGRESS NOTE  LJ MIYAMOTO SNK:[id number] DOB: 02/20/1932 DOA: 06/24/20 PCP: Marguarite Arbour, MD  HPI/Subjective: Patient able to track me walking around the bed from side to side. Unable to talk at this point.  Still agitated with his arms.  Objective: Vitals:   06/01/20 0919 06/01/20 1145  BP: (!) 199/121 (!) 143/65  Pulse: 98   Resp: 19 18  Temp:    SpO2: 95%     Intake/Output Summary (Last 24 hours) at 06/01/2020 1451 Last data filed at 06/01/2020 1104 Gross per 24 hour  Intake 1566.57 ml  Output 300 ml  Net 1266.57 ml   Filed Weights   05/27/20 1648  Weight: 99.8 kg    ROS: Review of Systems  Unable to perform ROS: Acuity of condition   Exam: Physical Exam HENT:     Nose: No mucosal edema.     Mouth/Throat:     Pharynx: No oropharyngeal exudate.  Eyes:     General: Lids are normal.     Conjunctiva/sclera: Conjunctivae normal.     Pupils: Pupils are equal, round, and reactive to light.  Cardiovascular:     Rate and Rhythm: Normal rate and regular rhythm.     Heart sounds: Normal heart sounds, S1 normal and S2 normal.  Pulmonary:     Breath sounds: Examination of the right-lower field reveals decreased breath sounds. Examination of the left-lower field reveals decreased breath sounds. Decreased breath sounds present. No wheezing, rhonchi or rales.     Comments: Upper airway congestion Abdominal:     Palpations: Abdomen is soft.     Tenderness: There is no abdominal tenderness.  Musculoskeletal:     Right lower leg: Swelling present.     Left lower leg: Swelling present.  Skin:    General: Skin is warm.     Findings: No rash.  Neurological:     Mental Status: He is lethargic.  Psychiatric:     Comments: Agitated       Data Reviewed: Basic Metabolic Panel: Recent Labs  Lab 05/27/20 1722 05/29/20 0434 05/31/20 0451 06/01/20 0546  NA 142 144  141 142  K 4.2 4.0 3.7 3.5  CL 103 107 109 105  CO2 28 29 27 25   GLUCOSE 139* 106* 99 93  BUN 24* 19 11 11   CREATININE 1.10 1.00 0.99 1.10  CALCIUM 9.1 9.2 8.8* 8.9   Liver Function Tests: Recent Labs  Lab 05/27/20 1722 05/31/20 0451  AST 29 28  ALT 25 20  ALKPHOS 74 64  BILITOT 0.9 0.9  PROT 6.5 6.0*  ALBUMIN 3.1* 2.8*   CBC: Recent Labs  Lab 05/27/20 1722 05/29/20 0434 05/31/20 0451 06/01/20 0546  WBC 11.1* 8.5 8.5 10.0  HGB 12.6* 10.7* 10.7* 12.0*  HCT 39.7 34.0* 33.5* 36.1*  MCV 91.5 93.2 90.5 87.0  PLT 127* 98* 81* 77*   BNP (last 3 results) Recent Labs    12/29/19 1303  BNP 104.0*     Recent Results (from the past 240 hour(s))  SARS Coronavirus 2 by RT PCR (hospital order, performed in [facility] hospital lab) Nasopharyngeal Nasopharyngeal Swab     Status: None   Collection Time: 06-24-2020  4:49 AM   Specimen: Nasopharyngeal Swab  Result Value Ref Range Status   SARS Coronavirus 2 NEGATIVE NEGATIVE Final    Comment: (NOTE) SARS-CoV-2 target nucleic acids are NOT DETECTED.  The  SARS-CoV-2 RNA is generally detectable in upper and lower respiratory specimens during the acute phase of infection. The lowest concentration of SARS-CoV-2 viral copies this assay can detect is 250 copies / mL. A negative result does not preclude SARS-CoV-2 infection and should not be used as the sole basis for treatment or other patient management decisions.  A negative result may occur with improper specimen collection / handling, submission of specimen other than nasopharyngeal swab, presence of viral mutation(s) within the areas targeted by this assay, and inadequate number of viral copies (<250 copies / mL). A negative result must be combined with clinical observations, patient history, and epidemiological information.  Fact Sheet for Patients:   BoilerBrush.com.cyhttps://www.fda.gov/media/136312/download  Fact Sheet for Healthcare  Providers: https://pope.com/https://www.fda.gov/media/136313/download  This test is not yet approved or  cleared by the [place] FDA and has been authorized for detection and/or diagnosis of SARS-CoV-2 by FDA under an Emergency Use Authorization (EUA).  This EUA will remain in effect (meaning this test can be used) for the duration of the COVID-19 declaration under Section 564(b)(1) of the Act, 21 U.S.C. section 360bbb-3(b)(1), unless the authorization is terminated or revoked sooner.  Performed at [facility], [address] [place], [place] [address]   CULTURE, BLOOD (ROUTINE X 2) w Reflex to ID Panel     Status: None (Preliminary result)   Collection Time: 05/29/20  4:15 PM   Specimen: BLOOD  Result Value Ref Range Status   Specimen Description BLOOD BLOOD RIGHT HAND  Final   Special Requests   Final    BOTTLES DRAWN AEROBIC AND ANAEROBIC Blood Culture adequate volume   Culture   Final    NO GROWTH 3 DAYS Performed at [facility], [address] [place], [place] [address]    Report Status PENDING  Incomplete  CULTURE, BLOOD (ROUTINE X 2) w Reflex to ID Panel     Status: None (Preliminary result)   Collection Time: 05/29/20  4:29 PM   Specimen: BLOOD  Result Value Ref Range Status   Specimen Description BLOOD BLOOD LEFT HAND  Final   Special Requests   Final    BOTTLES DRAWN AEROBIC AND ANAEROBIC Blood Culture adequate volume   Culture   Final    NO GROWTH 3 DAYS Performed at [facility], [address] [place], [place] [address]    Report Status PENDING  Incomplete      Scheduled Meds: .  stroke: mapping our early stages of recovery book   Does not apply Once  . amLODipine  5 mg Oral Daily  . Chlorhexidine Gluconate Cloth  6 each Topical Q0600  . cloNIDine  0.2 mg Transdermal Weekly  . cyproheptadine  4 mg Oral BID  . fluticasone  2 spray Each Nare Daily  . furosemide  40 mg Oral Q M,W,F  . metoprolol succinate  100 mg Oral Daily  .  tiotropium  18 mcg Inhalation Daily  . traZODone  50 mg Oral QHS   Continuous Infusions: . sodium chloride 40 mL/hr at 06/01/20 1104    Assessment/Plan:  1. Acute delirium.  Patient still very agitated with some upper extremity tremors.  Potential serotonin syndrome.  (Was on Zyvox and Zoloft). Continue to hold Remeron and Zoloft.  Nursing staff able to give him the cyproheptadine today we will continue that twice daily.  We will schedule IV Ativan. 2. Accelerated hypertension on clonidine patch.  Oral medications as able to take.  As needed IV labetalol.  Blood pressure  likely be higher if he is agitated. 3. Right MRSA leg infection.  Patient finished up Zyvox. 4. Procalcitonin negative so I stopped antibiotics for pneumonia. I think this was likely from previous pneumonia. 5. Hyperlipidemia unspecified hold atorvastatin 6. Nocturnal hypoxia.  Currently on oxygen during the day also 7. Previous stroke with right-sided weakness 8. Thrombocytopenia.  Platelet counts down at 77.  DVT prophylaxis remove the other day.  Looking back at old labs he has had some low platelet counts in the past.    Code Status:     Code Status Orders  (From admission, onward)         Start     Ordered   05/31/20 1035  Do not attempt resuscitation (DNR)  Continuous       Question Answer Comment  In the event of cardiac or respiratory ARREST Do not call a "code blue"   In the event of cardiac or respiratory ARREST Do not perform Intubation, CPR, defibrillation or ACLS   In the event of cardiac or respiratory ARREST Use medication by any route, position, wound care, and other measures to relive pain and suffering. May use oxygen, suction and manual treatment of airway obstruction as needed for comfort.   Comments nurse may pronounce      05/31/20 1034        Code Status History    Date Active Date Inactive Code Status Order ID Comments User Context   06/27/2020 0557 05/31/2020 1034 Full Code [id number]   Mansy, Vernetta Honey, MD ED   05/14/2020 1949 05/17/2020 1954 Full Code [id number]  Christena Flake, MD Inpatient   05/01/2020 1650 05/04/2020 2102 Full Code [id number]  Alford Highland, MD ED   12/29/2019 1519 01/03/2020 0204 Full Code [id number]  Lynn Ito, MD ED   11/17/2019 1659 11/20/2019 2019 Full Code [id number]  Lurene Shadow, MD ED   02/08/2019 0010 02/10/2019 1801 Full Code [id number]  Mayo, Allyn Kenner, MD Inpatient   10/19/2018 1055 10/21/2018 2322 Full Code [id number]  Milagros Loll, MD Inpatient   10/18/2018 2058 10/19/2018 1055 DNR [id number]  Milagros Loll, MD Inpatient   10/18/2018 1925 10/18/2018 2057 Full Code [id number]  Poggi, Excell Seltzer, MD Inpatient   10/17/2018 1423 10/18/2018 1826 Full Code [id number]  Poggi, Excell Seltzer, MD Inpatient   10/06/2018 1640 10/10/2018 1849 Full Code [id number]  Katha Hamming, MD ED   08/28/2018 1334 08/30/2018 1711 Full Code [id number]  Houston Siren, MD Inpatient   08/23/2018 0125 08/26/2018 2201 Full Code [id number]  Cammy Copa, MD Inpatient   02/15/2018 2012 02/21/2018 1700 Full Code [id number]  Maeola Harman, MD Inpatient   03/02/2017 1938 03/05/2017 1439 Full Code [id number]  Kennedy Bucker, MD Inpatient   Advance Care Planning Activity    Advance Directive Documentation     Most Recent Value  Type of Advance Directive Healthcare Power of Attorney, Living will  Pre-existing out of facility DNR order (yellow form or pink MOST form) --  "MOST" Form in Place? --     Family Communication: Wife at the bedside Disposition Plan: Status is: Inpatient  Dispo: The patient is from: Home              Anticipated d/c is to: Rehab              Anticipated d/c date is: Acute delirium will need to clear before we can even think about discharge              Patient currently agitated  and being treated for suspected serotonin syndrome.  Time spent: 28 minutes  [employer]

## 2020-06-01 NOTE — Plan of Care (Signed)
°  Problem: Education: Goal: Knowledge of General Education information will improve Description: Including pain rating scale, medication(s)/side effects and non-pharmacologic comfort measures 06/01/2020 0340 by Rowe Pavy, RN Outcome: Progressing 06/01/2020 0339 by Rowe Pavy, RN Outcome: Progressing   Problem: Health Behavior/Discharge Planning: Goal: Ability to manage health-related needs will improve 06/01/2020 0340 by Rowe Pavy, RN Outcome: Progressing 06/01/2020 0339 by Rowe Pavy, RN Outcome: Progressing   Problem: Clinical Measurements: Goal: Ability to maintain clinical measurements within normal limits will improve 06/01/2020 0340 by Rowe Pavy, RN Outcome: Progressing 06/01/2020 0339 by Rowe Pavy, RN Outcome: Progressing Goal: Will remain free from infection 06/01/2020 0340 by Rowe Pavy, RN Outcome: Progressing 06/01/2020 0339 by Rowe Pavy, RN Outcome: Progressing Goal: Diagnostic test results will improve 06/01/2020 0340 by Rowe Pavy, RN Outcome: Progressing 06/01/2020 0339 by Rowe Pavy, RN Outcome: Progressing Goal: Respiratory complications will improve 06/01/2020 0340 by Rowe Pavy, RN Outcome: Progressing 06/01/2020 0339 by Rowe Pavy, RN Outcome: Progressing Goal: Cardiovascular complication will be avoided 06/01/2020 0340 by Rowe Pavy, RN Outcome: Progressing 06/01/2020 0339 by Rowe Pavy, RN Outcome: Progressing   Problem: Activity: Goal: Risk for activity intolerance will decrease 06/01/2020 0340 by Rowe Pavy, RN Outcome: Progressing 06/01/2020 0339 by Rowe Pavy, RN Outcome: Progressing   Problem: Nutrition: Goal: Adequate nutrition will be maintained 06/01/2020 0340 by Rowe Pavy, RN Outcome: Progressing 06/01/2020 0339 by Rowe Pavy, RN Outcome: Progressing   Problem: Coping: Goal: Level  of anxiety will decrease 06/01/2020 0340 by Rowe Pavy, RN Outcome: Progressing 06/01/2020 0339 by Rowe Pavy, RN Outcome: Progressing   Problem: Elimination: Goal: Will not experience complications related to bowel motility 06/01/2020 0340 by Rowe Pavy, RN Outcome: Progressing 06/01/2020 0339 by Rowe Pavy, RN Outcome: Progressing Goal: Will not experience complications related to urinary retention 06/01/2020 0340 by Rowe Pavy, RN Outcome: Progressing 06/01/2020 0339 by Rowe Pavy, RN Outcome: Progressing   Problem: Pain Managment: Goal: General experience of comfort will improve 06/01/2020 0340 by Rowe Pavy, RN Outcome: Progressing 06/01/2020 0339 by Rowe Pavy, RN Outcome: Progressing   Problem: Safety: Goal: Ability to remain free from injury will improve 06/01/2020 0340 by Rowe Pavy, RN Outcome: Progressing 06/01/2020 0339 by Rowe Pavy, RN Outcome: Progressing   Problem: Skin Integrity: Goal: Risk for impaired skin integrity will decrease 06/01/2020 0340 by Rowe Pavy, RN Outcome: Progressing 06/01/2020 0339 by Rowe Pavy, RN Outcome: Progressing

## 2020-06-01 NOTE — Plan of Care (Signed)

## 2020-06-02 ENCOUNTER — Inpatient Hospital Stay: Payer: Medicare Other

## 2020-06-02 DIAGNOSIS — D696 Thrombocytopenia, unspecified: Secondary | ICD-10-CM

## 2020-06-02 LAB — BASIC METABOLIC PANEL
Anion gap: 15 (ref 5–15)
BUN: 17 mg/dL (ref 8–23)
CO2: 22 mmol/L (ref 22–32)
Calcium: 8.9 mg/dL (ref 8.9–10.3)
Chloride: 106 mmol/L (ref 98–111)
Creatinine, Ser: 1.31 mg/dL — ABNORMAL HIGH (ref 0.61–1.24)
GFR calc Af Amer: 57 mL/min — ABNORMAL LOW (ref >60)
GFR calc non Af Amer: 49 mL/min — ABNORMAL LOW (ref >60)
Glucose, Bld: 105 mg/dL — ABNORMAL HIGH (ref 70–99)
Potassium: 3.7 mmol/L (ref 3.5–5.1)
Sodium: 143 mmol/L (ref 135–145)

## 2020-06-02 LAB — CBC
HCT: 36.3 % — ABNORMAL LOW (ref 39.0–52.0)
Hemoglobin: 11.5 g/dL — ABNORMAL LOW (ref 13.0–17.0)
MCH: 28.9 pg (ref 26.0–34.0)
MCHC: 31.7 g/dL (ref 30.0–36.0)
MCV: 91.2 fL (ref 80.0–100.0)
Platelets: 77 10*3/uL — ABNORMAL LOW (ref 150–400)
RBC: 3.98 MIL/uL — ABNORMAL LOW (ref 4.22–5.81)
RDW: 14.7 % (ref 11.5–15.5)
WBC: 17.2 10*3/uL — ABNORMAL HIGH (ref 4.0–10.5)
nRBC: 0 % (ref 0.0–0.2)

## 2020-06-02 MED ORDER — SODIUM CHLORIDE 0.9 % IV SOLN
3.0000 g | Freq: Four times a day (QID) | INTRAVENOUS | Status: DC
  Administered 2020-06-02: 11:00:00 3 g via INTRAVENOUS
  Filled 2020-06-02: qty 8
  Filled 2020-06-02: qty 0.86
  Filled 2020-06-02: qty 8

## 2020-06-02 MED ORDER — DIPHENHYDRAMINE HCL 50 MG/ML IJ SOLN: 12.5000 mg | Freq: Four times a day (QID) | INTRAMUSCULAR | Status: DC | PRN

## 2020-06-02 MED ORDER — LORAZEPAM 2 MG/ML IJ SOLN
0.5000 mg | Freq: Three times a day (TID) | INTRAMUSCULAR | Status: DC
  Administered 2020-06-02: 0.5 mg via INTRAVENOUS
  Filled 2020-06-02: qty 1

## 2020-06-02 MED ORDER — BIOTENE DRY MOUTH MT LIQD: 15.0000 mL | OROMUCOSAL | Status: DC | PRN

## 2020-06-02 MED ORDER — IPRATROPIUM-ALBUTEROL 0.5-2.5 (3) MG/3ML IN SOLN: 3.0000 mL | Freq: Four times a day (QID) | RESPIRATORY_TRACT | Status: DC

## 2020-06-02 MED ORDER — GLYCOPYRROLATE 0.2 MG/ML IJ SOLN
0.2000 mg | INTRAMUSCULAR | Status: DC | PRN
  Administered 2020-06-02: 0.2 mg via INTRAVENOUS
  Filled 2020-06-02: qty 1

## 2020-06-02 MED ORDER — LORAZEPAM 2 MG/ML IJ SOLN: 0.5000 mg | Freq: Four times a day (QID) | INTRAMUSCULAR | Status: DC | PRN

## 2020-06-02 MED ORDER — GLYCOPYRROLATE 0.2 MG/ML IJ SOLN: 0.2000 mg | INTRAMUSCULAR | Status: DC | PRN

## 2020-06-02 MED ORDER — HYDROMORPHONE HCL 1 MG/ML IJ SOLN
0.5000 mg | Freq: Once | INTRAMUSCULAR | Status: AC
  Administered 2020-06-02: 12:00:00 0.5 mg via INTRAVENOUS
  Filled 2020-06-02: qty 1

## 2020-06-02 MED ORDER — GLYCOPYRROLATE 1 MG PO TABS
1.0000 mg | ORAL_TABLET | ORAL | Status: DC | PRN
  Filled 2020-06-02: qty 1

## 2020-06-02 MED ORDER — POLYVINYL ALCOHOL 1.4 % OP SOLN
1.0000 [drp] | Freq: Four times a day (QID) | OPHTHALMIC | Status: DC | PRN
  Filled 2020-06-02: qty 15

## 2020-06-02 MED ORDER — SCOPOLAMINE 1 MG/3DAYS TD PT72
1.0000 | MEDICATED_PATCH | TRANSDERMAL | Status: DC
  Administered 2020-06-02: 12:00:00 1.5 mg via TRANSDERMAL
  Filled 2020-06-02: qty 1

## 2020-06-02 MED ORDER — SODIUM CHLORIDE 0.9 % IV SOLN
2.0000 mg/h | INTRAVENOUS | Status: DC
  Administered 2020-06-02 (×2): 2 mg/h via INTRAVENOUS
  Filled 2020-06-02 (×2): qty 2.5

## 2020-06-02 NOTE — Progress Notes (Signed)
Ch visited with Pt's wife [name]. Ch was paged to be with family after having put Pt in comfort-care. Ch prayed with [name]. [name] shared how hard it is going to be after having been together for 63 years. [name] let Ch know about sons traveling from [place] and [place], later today. Ch let her know about anytime chaplain availablility for support.

## 2020-06-02 NOTE — Progress Notes (Signed)
   06/02/20 1127  Provider Notification  Provider Name/Title Wieting  Date Provider Notified 06/02/20  Time Provider Notified 1127  Notification Type Face-to-face  Response See new orders (Comfort Measures initiated by Dr Renae Gloss)  Dr Renae Gloss upon completion of pt rounds entered orders for Comfort Measures; pt's spouse at bedside

## 2020-06-02 NOTE — Progress Notes (Signed)
Pt reassessed at 20:18, pt MEWs score green. Pt X   06/01/20 2015  Assess: MEWS Score  BP (!) 192/89  ECG Heart Rate (!) 118  Resp (!) 22  Assess: MEWS Score  MEWS Temp 0  MEWS Systolic 0  MEWS Pulse 2  MEWS RR 1  MEWS LOC 1  MEWS Score 4  MEWS Score Color Red  Assess: if the MEWS score is Yellow or Red  Were vital signs taken at a resting state? Yes  Focused Assessment No change from prior assessment  Early Detection of Sepsis Score *See Row Information* Medium  MEWS guidelines implemented *See Row Information* No, vital signs rechecked (Vitals recheck at 20;18)  2 vitals are inacuurate due to patient mental status patient AMS, contuse, restless, and fidgety when X2 is taking VS.

## 2020-06-02 NOTE — Progress Notes (Signed)
OT Cancellation Note  Patient Details Name: Gary Thomas MRN: [id number] DOB: 28-Jul-1932   Cancelled Treatment:    Reason Eval/Treat Not Completed: Other (comment)  Pt status changed to comfort care. Will complete OT order and sign off at this time. Thank you.  Rejeana Brock, MS, OTR/L ascom [phone] 06/02/20, 6:09 PM

## 2020-06-02 NOTE — Progress Notes (Signed)
PT Cancellation Note  Patient Details Name: Gary Thomas MRN: [id number] DOB: 10-May-1932   Cancelled Treatment:    Reason Eval/Treat Not Completed: Fatigue/lethargy limiting ability to participate. Nurse reports that he is going to be put on comfort care.    Ezekiel Ina, [place] DPT 06/02/2020, 11:55 AM

## 2020-06-02 NOTE — Progress Notes (Signed)
This note also relates to the following rows which could not be included: ECG Heart Rate - Cannot attach notes to unvalidated device data    06/02/20 0827  Provider Notification  Provider Name/Title Wieting  Date Provider Notified 06/02/20  Time Provider Notified 0827  Notification Type  (secure chat)  Notification Reason Other (Comment) (advised of WBC results and early sepsis dectection score)  Response Other (Comment);No new orders (will assess upon rounds)  WBC 17.2; early Sepsis detection score increasing; Dr Renae Gloss made aware via secure chat, will continue to monitor pt

## 2020-06-02 NOTE — Progress Notes (Signed)
Patient ID: Gary Thomas Holck, male   DOB: September 14, 1932, 86 y.o.   MRN: [id number] [facility] PROGRESS NOTE  Gary Thomas Rinkenberger YQM:[id number] DOB: September 14, 1932 DOA: 05/22/2020 PCP: Marguarite ArbourSparks, [name], MD  HPI/Subjective: Able to open his eyes and look at me but unable to speak.  Now with a lot of upper airway congestion and gurgling.  Less agitation after medications were given.  Objective: Vitals:   06/02/20 0644 06/02/20 0944  BP: (!) 141/92 (!) 137/124  Pulse: 94 (!) 103  Resp:  (!) 24  Temp: 99.9 F (37.7 C) 98.7 F (37.1 C)  SpO2: 94% 94%    Intake/Output Summary (Last 24 hours) at 06/02/2020 1314 Last data filed at 06/02/2020 1152 Gross per 24 hour  Intake 1069.67 ml  Output 775 ml  Net 294.67 ml   Filed Weights   05/27/20 1648  Weight: 99.8 kg    ROS: Review of Systems  Unable to perform ROS: Acuity of condition   Exam: Physical Exam HENT:     Mouth/Throat:     Mouth: Mucous membranes are dry.     Pharynx: No oropharyngeal exudate.  Eyes:     General: Lids are normal.     Conjunctiva/sclera: Conjunctivae normal.     Pupils: Pupils are equal, round, and reactive to light.  Cardiovascular:     Rate and Rhythm: Normal rate and regular rhythm.     Heart sounds: Normal heart sounds, S1 normal and S2 normal.  Pulmonary:     Breath sounds: Transmitted upper airway sounds present. Examination of the right-upper field reveals wheezing. Examination of the left-upper field reveals wheezing. Examination of the right-lower field reveals decreased breath sounds and rhonchi. Examination of the left-lower field reveals decreased breath sounds and rhonchi. Decreased breath sounds, wheezing and rhonchi present. No rales.     Comments: Gurgling and upper airway Abdominal:     Palpations: Abdomen is soft.     Tenderness: There is no abdominal tenderness.  Musculoskeletal:     Right ankle: Swelling present.     Left ankle: Swelling present.  Skin:    General: Skin is warm.      Findings: No rash.  Neurological:     Mental Status: He is lethargic.     Comments: Able to open eyes when stimulated. Less agitation than previous days       Data Reviewed: Basic Metabolic Panel: Recent Labs  Lab 05/27/20 1722 05/29/20 0434 05/31/20 0451 06/01/20 0546 06/02/20 0703  NA 142 144 141 142 143  K 4.2 4.0 3.7 3.5 3.7  CL 103 107 109 105 106  CO2 28 29 27 25 22   GLUCOSE 139* 106* 99 93 105*  BUN 24* 19 11 11 17   CREATININE 1.10 1.00 0.99 1.10 1.31*  CALCIUM 9.1 9.2 8.8* 8.9 8.9   Liver Function Tests: Recent Labs  Lab 05/27/20 1722 05/31/20 0451  AST 29 28  ALT 25 20  ALKPHOS 74 64  BILITOT 0.9 0.9  PROT 6.5 6.0*  ALBUMIN 3.1* 2.8*   CBC: Recent Labs  Lab 05/27/20 1722 05/29/20 0434 05/31/20 0451 06/01/20 0546 06/02/20 0703  WBC 11.1* 8.5 8.5 10.0 17.2*  HGB 12.6* 10.7* 10.7* 12.0* 11.5*  HCT 39.7 34.0* 33.5* 36.1* 36.3*  MCV 91.5 93.2 90.5 87.0 91.2  PLT 127* 98* 81* 77* 77*   Cardiac Enzymes: Recent Labs  Lab 06/01/20 0546  CKTOTAL 57   BNP (last 3 results) Recent Labs    12/29/19 1303  BNP 104.0*  Recent Results (from the past 240 hour(s))  SARS Coronavirus 2 by RT PCR (hospital order, performed in [facility] hospital lab) Nasopharyngeal Nasopharyngeal Swab     Status: None   Collection Time: 2020-06-05  4:49 AM   Specimen: Nasopharyngeal Swab  Result Value Ref Range Status   SARS Coronavirus 2 NEGATIVE NEGATIVE Final    Comment: (NOTE) SARS-CoV-2 target nucleic acids are NOT DETECTED.  The SARS-CoV-2 RNA is generally detectable in upper and lower respiratory specimens during the acute phase of infection. The lowest concentration of SARS-CoV-2 viral copies this assay can detect is 250 copies / mL. A negative result does not preclude SARS-CoV-2 infection and should not be used as the sole basis for treatment or other patient management decisions.  A negative result may occur with improper specimen collection /  handling, submission of specimen other than nasopharyngeal swab, presence of viral mutation(s) within the areas targeted by this assay, and inadequate number of viral copies (<250 copies / mL). A negative result must be combined with clinical observations, patient history, and epidemiological information.  Fact Sheet for Patients:   BoilerBrush.com.cy  Fact Sheet for Healthcare Providers: https://pope.com/  This test is not yet approved or  cleared by the [place] FDA and has been authorized for detection and/or diagnosis of SARS-CoV-2 by FDA under an Emergency Use Authorization (EUA).  This EUA will remain in effect (meaning this test can be used) for the duration of the COVID-19 declaration under Section 564(b)(1) of the Act, 21 U.S.C. section 360bbb-3(b)(1), unless the authorization is terminated or revoked sooner.  Performed at [facility], [address] [place], [place] [address]   CULTURE, BLOOD (ROUTINE X 2) w Reflex to ID Panel     Status: None (Preliminary result)   Collection Time: 05/29/20  4:15 PM   Specimen: BLOOD  Result Value Ref Range Status   Specimen Description BLOOD BLOOD RIGHT HAND  Final   Special Requests   Final    BOTTLES DRAWN AEROBIC AND ANAEROBIC Blood Culture adequate volume   Culture   Final    NO GROWTH 4 DAYS Performed at [facility], [address], [place], [place] [address]    Report Status PENDING  Incomplete  CULTURE, BLOOD (ROUTINE X 2) w Reflex to ID Panel     Status: None (Preliminary result)   Collection Time: 05/29/20  4:29 PM   Specimen: BLOOD  Result Value Ref Range Status   Specimen Description BLOOD BLOOD LEFT HAND  Final   Special Requests   Final    BOTTLES DRAWN AEROBIC AND ANAEROBIC Blood Culture adequate volume   Culture   Final    NO GROWTH 4 DAYS Performed at [facility], [address], [place], [place] [address]    Report Status  PENDING  Incomplete     Studies: DG Chest Port 1 View  Result Date: 06/02/2020 CLINICAL DATA:  Leukocytosis. EXAM: PORTABLE CHEST 1 VIEW COMPARISON:  06/05/2020 FINDINGS: The elevated right hemidiaphragm with adjacent atelectasis is stable. No pneumothorax. No new focal infiltrates. The cardiomediastinal silhouette is stable. IMPRESSION: No interval changes. Mild bibasilar atelectasis. Stable elevated right hemidiaphragm. Electronically Signed   By: Gerome Sam III M.D   On: 06/02/2020 11:01    Scheduled Meds: .  stroke: mapping our early stages of recovery book   Does not apply Once  . cloNIDine  0.2 mg Transdermal Weekly  . scopolamine  1 patch Transdermal Q72H   Continuous Infusions: .  HYDROmorphone 2 mg/hr (06/02/20 1309)    Assessment/Plan:  1. Acute hypoxic respiratory failure with gurgling on upper airway secretions.  At this point I do not think he will safely be able to handle even his own saliva.  Patient already a DNR.  Case discussed with the patient's wife at the bedside and we will go towards comfort care measures at this point.  I gave a test dose of Dilaudid and had no anaphylactic reaction so I will start Dilaudid drip.  Scopolamine patch.  As needed medications for comfort. 2. Acute delirium secondary to serotonin syndrome.  Despite giving cyproheptadine and Ativan not much improvement at all. 3. Accelerated hypertension.  At this point unable to give any oral medications 4. Right leg MRSA leg infection finished antibiotics 5. Hyperlipidemia unspecified 6. Nocturnal hypoxia 7. Previous stroke with right-sided weakness 8. Thrombocytopenia    Code Status:     Code Status Orders  (From admission, onward)         Start     Ordered   06/02/20 1127  Do not attempt resuscitation (DNR)  Continuous       Question Answer Comment  In the event of cardiac or respiratory ARREST Do not call a "code blue"   In the event of cardiac or respiratory ARREST Do not  perform Intubation, CPR, defibrillation or ACLS   In the event of cardiac or respiratory ARREST Use medication by any route, position, wound care, and other measures to relive pain and suffering. May use oxygen, suction and manual treatment of airway obstruction as needed for comfort.   Comments nurse may pronounce      06/02/20 1126        Code Status History    Date Active Date Inactive Code Status Order ID Comments User Context   05/31/2020 1034 06/02/2020 1126 DNR [id number]  Alford Highland, MD Inpatient   June 06, 2020 0557 05/31/2020 1034 Full Code [id number]  Hannah Beat, MD ED   05/14/2020 1949 05/17/2020 1954 Full Code [id number]  Christena Flake, MD Inpatient   05/01/2020 1650 05/04/2020 2102 Full Code [id number]  Alford Highland, MD ED   12/29/2019 1519 01/03/2020 0204 Full Code [id number]  Lynn Ito, MD ED   11/17/2019 1659 11/20/2019 2019 Full Code [id number]  Lurene Shadow, MD ED   02/08/2019 0010 02/10/2019 1801 Full Code [id number]  Mayo, Allyn Kenner, MD Inpatient   10/19/2018 1055 10/21/2018 2322 Full Code [id number]  Milagros Loll, MD Inpatient   10/18/2018 2058 10/19/2018 1055 DNR [id number]  Milagros Loll, MD Inpatient   10/18/2018 1925 10/18/2018 2057 Full Code [id number]  Poggi, Excell Seltzer, MD Inpatient   10/17/2018 1423 10/18/2018 1826 Full Code [id number]  Poggi, Excell Seltzer, MD Inpatient   10/06/2018 1640 10/10/2018 1849 Full Code [id number]  Katha Hamming, MD ED   08/28/2018 1334 08/30/2018 1711 Full Code [id number]  Houston Siren, MD Inpatient   08/23/2018 0125 08/26/2018 2201 Full Code [id number]  Cammy Copa, MD Inpatient   02/15/2018 2012 02/21/2018 1700 Full Code [id number]  Maeola Harman, MD Inpatient   03/02/2017 1938 03/05/2017 1439 Full Code [id number]  Kennedy Bucker, MD Inpatient   Advance Care Planning Activity    Advance Directive Documentation     Most Recent Value  Type of Advance Directive Healthcare Power of Attorney, Living will  Pre-existing out of facility DNR order (yellow form or pink  MOST form) --  "MOST" Form in Place? --     Family Communication: Spoke with patient's wife at the  bedside Disposition Plan: Status is: Inpatient  Dispo: The patient is from: Home              Anticipated d/c is to: Comfort care measures here in the hospital              Anticipated d/c date is: Comfort care measures here in the hospital              Patient currently switched to comfort care measures since he is having trouble handling his own secretions and saliva.  Time spent: 30 minutes  [employer]

## 2020-06-02 NOTE — Progress Notes (Signed)
   06/02/20 0644  Vitals  Temp 99.9 F (37.7 C)  Temp Source Oral  BP (!) 141/92  MAP (mmHg) 104  BP Method Automatic  Pulse Rate 94  Pulse Rate Source Monitor  MEWS COLOR  MEWS Score Color Yellow  Oxygen Therapy  SpO2 94 %  MEWS Score  MEWS Temp 0  MEWS Systolic 0  MEWS Pulse 0  MEWS RR 2  MEWS LOC 0  MEWS Score 2

## 2020-06-02 NOTE — Progress Notes (Signed)
Pharmacy Antibiotic Note  Gary Thomas is a 86 y.o. male admitted on 06-01-20 with aspiration pneumonia.  Pharmacy has been consulted for Unasyn dosing.  Plan: Unasyn 3 g IV q6h   Weight: 99.8 kg (220 lb)  Temp (24hrs), Avg:98.6 F (37 C), Min:97.6 F (36.4 C), Max:99.9 F (37.7 C)  Recent Labs  Lab 05/27/20 1722 01-Jun-2020 0449 05/29/20 0434 05/31/20 0451 06/01/20 0546 06/02/20 0703  WBC 11.1*  --  8.5 8.5 10.0 17.2*  CREATININE 1.10  --  1.00 0.99 1.10 1.31*  LATICACIDVEN  --  0.8  --   --   --   --     Estimated Creatinine Clearance: 47.1 mL/min (A) (by C-G formula based on SCr of 1.31 mg/dL (H)).    Allergies  Allergen Reactions  . Butorphanol Anaphylaxis  . Codeine Anaphylaxis  . Morphine Anaphylaxis  . Gabapentin Other (See Comments)    Unsure of what the reaction was   . Lyrica [Pregabalin] Rash  . Tapentadol Nausea Only    ONSET 01/04/2013  . Tramadol     Hallucinations "I see giant spiders on ceiling". Pt also believes he is in [facility], but he is in [place].    Antimicrobials this admission: Unasyn 8/22 >>  Dose adjustments this admission:   Microbiology results: 8/18 BCx: NGTD  Thank you for allowing pharmacy to be a part of this patient's care.  Marty Heck 06/02/2020 8:51 AM

## 2020-06-02 NOTE — Progress Notes (Signed)
   06/02/20 0944  Assess: MEWS Score  Temp 98.7 F (37.1 C)  BP (!) 137/124  Pulse Rate (!) 103  Resp (!) 24  Level of Consciousness Responds to Voice  SpO2 94 %  O2 Device Nasal Cannula  Patient Activity (if Appropriate) In bed  O2 Flow Rate (L/min) 3 L/min  Assess: MEWS Score  MEWS Temp 0  MEWS Systolic 0  MEWS Pulse 1  MEWS RR 1  MEWS LOC 1  MEWS Score 3  MEWS Score Color Comfort Care Only  to continue to assess pt

## 2020-06-02 NOTE — Progress Notes (Signed)
Pt reassessed at 20:18, pt MEWs score green. Pt X2 vitals are inacuurate due to patient mental status patient AMS, contuse, restless, and fidgety when X2 is taking VS.

## 2020-06-02 NOTE — Progress Notes (Signed)
Pt. V/s inaccurate due to tremors, restlessness, and agitation during time when X2 is assessing v/s.    06/02/20 0644  Vitals  Temp 99.9 F (37.7 C)  Temp Source Oral  BP (!) 141/92  MAP (mmHg) 104  BP Method Automatic  Pulse Rate 94  Pulse Rate Source Monitor  MEWS COLOR  MEWS Score Color Yellow  Oxygen Therapy  SpO2 94 %  MEWS Score  MEWS Temp 0  MEWS Systolic 0  MEWS Pulse 0  MEWS RR 2  MEWS LOC 0  MEWS Score 2

## 2020-06-03 LAB — CULTURE, BLOOD (ROUTINE X 2)
Culture: NO GROWTH
Culture: NO GROWTH
Special Requests: ADEQUATE
Special Requests: ADEQUATE

## 2020-06-12 NOTE — Plan of Care (Signed)

## 2020-06-12 NOTE — Care Management Important Message (Signed)
Important Message  Patient Details  Name: Gary Thomas MRN: [id number] Date of Birth: 1932/01/14   Medicare Important Message Given:  Other (see comment)  Patient expired.   Johnell Comings 06/11/2020, 9:48 AM

## 2020-06-12 NOTE — Progress Notes (Addendum)
Pt.family at bedside at beginning of shift, pt care has been changed to comfort care, and on Dilaudid 2mg /hr rate 4mg /hr. Pt had a change in respiration at 3:00am nurse was at bedside with patient, patient breathing pattern changed Cheyne-Stokes to not breathing at all, and no heart beat. Two nurses pronounce death at 3:20am, family was notified at 3:25am, and [facility] was notified, pt isn't candidate for organ donor. , MD, and [place] notified. Family requesting to contact [employer]. Family requested not to come and see pt one last time.

## 2020-06-12 NOTE — Death Summary Note (Signed)
DEATH SUMMARY   Patient Details  Name: Gary Thomas MRN: [id number] DOB: [birth year]  Admission/Discharge Information   Admit Date:  June 06, 2020  Date of Death: Date of Death: 06-12-20  Time of Death: Time of Death: 0320  Length of Stay: 6  Referring Physician: Marguarite Arbour, MD   Reason(s) for Hospitalization  Was admitted with altered mental status and suspected pneumonia  Diagnoses  Preliminary cause of death:  Secondary Diagnoses (including complications and co-morbidities):  Acute hypoxic respiratory failure Serotonin syndrome with acute delirium Accelerated hypertension Recent right MRSA leg infection Previous pneumonia  Brief Hospital Course (including significant findings, care, treatment, and services provided and events leading to death)  [name] is a 86 y.o. year old male who had a recent hospitalization for pneumonia and had a recent hospitalization for MRSA infection of the leg.  He came in with altered mental status and started on antibiotics for pneumonia.  Stroke work-up was negative.  His procalcitonin was negative.  Less likely pneumonia.  Patient became more delirious during the hospital course.  Despite stopping Zoloft and Zyvox course finishing, he continued to get worse.  I treated with Ativan and cyproheptadine with not much improvement.  Accelerated hypertension was treated with IV, oral and transdermal medications.  The patient continued to get worse and on 06/02/2020 he was gurgling on his secretions and unable to handle even his own saliva.  At that point a decision was made to make him comfort care and he was started on Dilaudid drip.  Patient passed away at 3:20 AM.    Pertinent Labs and Studies  Significant Diagnostic Studies CT Head Wo Contrast  Result Date: 05/27/2020 CLINICAL DATA:  Altered level of consciousness, slurred speech, delirium EXAM: CT HEAD WITHOUT CONTRAST TECHNIQUE: Contiguous axial images were obtained from the base of  the skull through the vertex without intravenous contrast. COMPARISON:  04/02/2007 FINDINGS: Brain: There progressive confluent hypodensities throughout the periventricular and subcortical white matter, consistent with age-indeterminate small vessel ischemic changes. Extensive hypodensities are also seen within the bilateral basal ganglia, more pronounced than previous. Area of chronic right frontal cortical infarct with associated encephalomalacia is seen. No acute hemorrhage. Lateral ventricles are unremarkable. No acute extra-axial fluid collections. No mass effect. Vascular: No hyperdense vessel or unexpected calcification. Skull: Normal. Negative for fracture or focal lesion. Sinuses/Orbits: No acute finding. Other: None. IMPRESSION: 1. Progressive age-indeterminate small vessel ischemic changes throughout the white matter and basal ganglia. 2. Chronic right frontal cortical infarct. 3. No acute hemorrhage. Electronically Signed   By: Sharlet Salina M.D.   On: 05/27/2020 17:40   CT ANGIO NECK W OR WO CONTRAST  Result Date: 05/29/2020 CLINICAL DATA:  Carotid stenosis screening. EXAM: CT ANGIOGRAPHY NECK TECHNIQUE: Multidetector CT imaging of the neck was performed using the standard protocol during bolus administration of intravenous contrast. Multiplanar CT image reconstructions and MIPs were obtained to evaluate the vascular anatomy. Carotid stenosis measurements (when applicable) are obtained utilizing NASCET criteria, using the distal internal carotid diameter as the denominator. CONTRAST:  75mL OMNIPAQUE IOHEXOL 350 MG/ML SOLN COMPARISON:  Carotid Doppler June 06, 2020 FINDINGS: Aortic arch: Atherosclerotic calcification aortic arch without aneurysm. Standard branching of the arch. Proximal great vessels patent without significant stenosis. Right carotid system: Right common carotid artery widely patent. Surgical clips near the carotid bifurcation presumably from carotid endarterectomy. Approximately 25%  diameter stenosis proximal right internal carotid artery. Origin of the right external carotid artery obscured by artifact from clips however the right external artery  branches are small suggesting decreased flow. Left carotid system: Left common carotid artery widely patent. Surgical clips around the left carotid bifurcation presumably from carotid endarterectomy. Soft tissue thickening in the proximal left internal carotid artery narrowing the lumen by 30% diameter stenosis. There is a severe stenosis of the proximal external carotid artery which is patent. Vertebral arteries: Right vertebral artery dominant and patent to the basilar without stenosis. 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 The cervical vertebral artery is diffusely disease with multiple areas of Ca severe stenosis. There is reconstitution at C2 and then occlusion at C1. There is reconstitution at C1. There is a moderate calcific stenosis at the skull base. PICA patent bilaterally. Skeleton: Cervical spondylosis and kyphosis. No acute skeletal abnormality. Other neck: 9 mm nodule right thyroid with small calcifications. 12 mm left lower pole thyroid nodule. No further imaging necessary based on nodule size. No adenopathy in the neck. Upper chest: Patchy airspace disease superior segment left lower lobe. Mild airspace disease right superior segment lower lobe which may be atelectasis. These findings are unchanged from prior chest CT of 12/29/2019. IMPRESSION: 1. Right carotid endarterectomy with 25% diameter stenosis proximal right internal carotid artery. There is artifact obscuring the right external carotid artery which appears to be stenotic with decreased flow. 2. Left carotid endarterectomy with 30% diameter stenosis proximal left internal carotid artery. Severe stenosis proximal left external carotid artery. 3. Right vertebral artery is dominant and patent to the basilar without stenosis 4. 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 There is reconstitution at C1 which is continuous to the basilar however there is a moderate stenosis at the skull base. 5. Bilateral thyroid nodules.  No further imaging necessary. 6. Patchy airspace disease in the superior segment of the lower lobe bilaterally. These findings are only partially evaluated on the current study. No change from 12/29/2019. Recommend follow-up chest CT in 3 months for further recommendations. Electronically Signed   By: Marlan Palauharles  Clark M.D.   On: 05/29/2020 15:28   MR BRAIN WO CONTRAST  Result Date: 2020/05/10 CLINICAL DATA:  Episode of slurred speech and altered mental status on Saturday EXAM: MRI HEAD WITHOUT CONTRAST TECHNIQUE: Multiplanar, multiecho pulse sequences of the brain and surrounding structures were obtained without intravenous contrast. COMPARISON:  Head CT from yesterday.  Brain MRI 01/16/2016 FINDINGS: Brain: No acute infarction, hemorrhage, hydrocephalus, extra-axial collection or mass lesion. Confluent FLAIR hyperintensity in the cerebral white matter, pons, and deep gray nuclei. Given presence since at least 2017 this is attributed to severe chronic small vessel ischemia. Chronic lacunes are seen at the left thalamus and bilateral caudate. There has been generalized cerebral volume loss since prior. Remote anterior right frontal infarct affecting cortex. No acute hemorrhage, obstructive hydrocephalus, mass, or collection. Vascular: Preserved flow voids. Skull and upper cervical spine: No focal marrow lesion. Sinuses/Orbits: Bilateral cataract resection. Other: Motion degraded. IMPRESSION: 1. No acute finding. 2. Severe chronic small vessel ischemia. Remote right anterior frontal cortex infarct. 3. Brain atrophy since 2017. Electronically Signed   By: Marnee SpringJonathon  Watts M.D.   On: 02021/07/30 04:59   CT FEMUR RIGHT WO CONTRAST  Result Date: 05/10/2020 CLINICAL DATA:  Right femur pain.  Right hip and knee arthroplasties with history of periprosthetic fracture. EXAM: CT OF THE LOWER RIGHT EXTREMITY WITHOUT CONTRAST TECHNIQUE: Multidetector CT imaging of the right lower extremity was performed according to the standard protocol. COMPARISON:  08/23/2018 x-ray FINDINGS: Bones/Joint/Cartilage Patient  is status post right total hip arthroplasty. Arthroplasty components appear intact and appropriately aligned. No evidence of periprosthetic fracture related to the femoral or acetabular components of the right hip prosthesis. No periprosthetic lucency to suggest hardware loosening. Posttraumatic changes related to a well-healed mid femoral diaphyseal fracture with robust callus formation. Status post right total knee arthroplasty. Arthroplasty components are in their expected alignment without dislocation. There is a nondisplaced obliquely oriented periprosthetic fracture extending obliquely along the lateral cortex of the distal femur from the level of the femoral component of the knee hardware (series 4, images 300-315; series 8, image 56). No evidence of periprosthetic lucency to suggest hardware loosening. Multiple ghost screw tracks of the distal femur related to prior hardware removal. There is a 6.0 x 0.6 cm long osseous fragment along the anterolateral aspect of the mid to distal femoral diaphysis with a surrounding fluid collection measuring approximately 8.4 x 1.7 x 3.6 cm (series 7, image 84; series 5, image 232). This fragment was present on prior exams from 2019. Ligaments Suboptimally assessed by CT. Muscles and Tendons Muscle atrophy within the anterior compartment of the thigh. Soft tissues Nonspecific soft tissue edema over the lateral aspect of the thigh. Prominent vascular calcifications. IMPRESSION: 1. Nondisplaced obliquely oriented periprosthetic fracture of the lateral aspect of the distal right femoral metaphysis from the level of the femoral component of the knee hardware. Fracture  line appears acute to subacute. 2. Status post right total hip arthroplasty without evidence of periprosthetic fracture or hardware loosening. 3. Chronic posttraumatic changes of the mid femoral diaphyseal fracture. 4. There is a 6.0 x 1.7 x 3.6 cm osseous fragment along the anterolateral aspect of the mid to distal femoral diaphysis with a surrounding fluid collection measuring approximately 8.4 x 1.7 x 3.6 cm. This fragment was present on prior exams from 2019. Fluid collection may reflect sequela of postoperative seroma or possibly an adventitial bursa. These results will be called to the ordering clinician or representative by the Radiologist Assistant, and communication documented in the PACS or [employer]. Electronically Signed   By: Duanne Guess D.O.   On: 05/10/2020 16:50   US Carotid Bilateral (at [facility] and [facility] only)  Result Date: June 03, 2020 CLINICAL DATA:  TIA. Previous left carotid surgery. Hypertension, history of tobacco abuse EXAM: BILATERAL CAROTID DUPLEX ULTRASOUND TECHNIQUE: Wallace Cullens scale imaging, color Doppler and duplex ultrasound were performed of bilateral carotid and vertebral arteries in the neck. Technologist describes technically difficult study secondary to patient uncooperative and shaking. COMPARISON:  07/02/2004 by report only FINDINGS: Criteria: Quantification of carotid stenosis is based on velocity parameters that correlate the residual internal carotid diameter with NASCET-based stenosis levels, using the diameter of the distal internal carotid lumen as the denominator for stenosis measurement. The following velocity measurements were obtained: RIGHT ICA: 183/20 cm/sec CCA: 36/7 cm/sec SYSTOLIC ICA/CCA RATIO:  5.1 ECA: Not identified LEFT ICA: 125/27 cm/sec CCA: 43/13 cm/sec SYSTOLIC ICA/CCA RATIO:  2.9 ECA: 46 cm/sec RIGHT CAROTID ARTERY: Mild plaque at the ICA origin without high-grade stenosis. Elevated peak systolic velocities in the mid ICA. Normal waveforms and color  Doppler signal. RIGHT VERTEBRAL ARTERY:  Normal flow direction and waveform. LEFT CAROTID ARTERY: Left ICA graft or stent. Eccentric calcified plaque in the bulb and proximal ICA with mild stenosis. Normal waveforms and color Doppler signal. LEFT VERTEBRAL ARTERY:  Not identified IMPRESSION: 1. Left ICA postop changes without residual or recurrent high-grade stenosis. 2. Mild proximal right ICA plaque resulting in less than 50-69% diameter  stenosis. 3. Flow signal not seen in the left vertebral artery. 4. Antegrade flow in the right vertebral artery. Electronically Signed   By: Corlis Leak M.D.   On: 05/27/2020 17:22   US Venous Img Lower Bilateral (DVT)  Result Date: 05/29/2020 CLINICAL DATA:  Bilateral lower extremity edema for 1 week EXAM: BILATERAL LOWER EXTREMITY VENOUS DOPPLER ULTRASOUND TECHNIQUE: Gray-scale sonography with graded compression, as well as color Doppler and duplex ultrasound were performed to evaluate the lower extremity deep venous systems from the level of the common femoral vein and including the common femoral, femoral, profunda femoral, popliteal and calf veins including the posterior tibial, peroneal and gastrocnemius veins when visible. The superficial great saphenous vein was also interrogated. Spectral Doppler was utilized to evaluate flow at rest and with distal augmentation maneuvers in the common femoral, femoral and popliteal veins. COMPARISON:  None. FINDINGS: RIGHT LOWER EXTREMITY Common Femoral Vein: No evidence of thrombus. Normal compressibility, respiratory phasicity and response to augmentation. Saphenofemoral Junction: No evidence of thrombus. Normal compressibility and flow on color Doppler imaging. Profunda Femoral Vein: No evidence of thrombus. Normal compressibility and flow on color Doppler imaging. Femoral Vein: No evidence of thrombus. Normal compressibility, respiratory phasicity and response to augmentation. Popliteal Vein: No evidence of thrombus. Normal  compressibility, respiratory phasicity and response to augmentation. Calf Veins: Limited assessment of the calf veins. Posterior tibial vein appears patent. Peroneal vein not well visualized. No gross acute calf DVT demonstrated. LEFT LOWER EXTREMITY Common Femoral Vein: No evidence of thrombus. Normal compressibility, respiratory phasicity and response to augmentation. Saphenofemoral Junction: No evidence of thrombus. Normal compressibility and flow on color Doppler imaging. Profunda Femoral Vein: No evidence of thrombus. Normal compressibility and flow on color Doppler imaging. Femoral Vein: No evidence of thrombus. Normal compressibility, respiratory phasicity and response to augmentation. Popliteal Vein: No evidence of thrombus. Normal compressibility, respiratory phasicity and response to augmentation. Calf Veins: Limited assessment of the calf veins. Posterior tibial vein appears patent. Peroneal vein not well visualized. No gross acute occlusive thrombus. IMPRESSION: No evidence of significant deep venous thrombosis in either lower extremity. Limited assessment of the calf veins as above. Electronically Signed   By: Judie Petit.  Shick M.D.   On: 05/29/2020 19:43   DG Chest Port 1 View  Result Date: 06/02/2020 CLINICAL DATA:  Leukocytosis. EXAM: PORTABLE CHEST 1 VIEW COMPARISON:  May 28, 2020 FINDINGS: The elevated right hemidiaphragm with adjacent atelectasis is stable. No pneumothorax. No new focal infiltrates. The cardiomediastinal silhouette is stable. IMPRESSION: No interval changes. Mild bibasilar atelectasis. Stable elevated right hemidiaphragm. Electronically Signed   By: Gerome Sam III M.D   On: 06/02/2020 11:01   DG Chest Portable 1 View  Result Date: 06/02/2020 CLINICAL DATA:  Cough, altered mental status, possible stroke EXAM: PORTABLE CHEST 1 VIEW COMPARISON:  Radiograph 05/01/2020, CT 05/01/2020 FINDINGS: Chronic elevation of the right hemidiaphragm with some adjacent scarring and or  atelectasis. Some increasing medial basilar opacities bilaterally may reflect worsening atelectasis or developing infection. No visible pneumothorax or effusion. Stable surgical material along the right brachiocephalic vasculature. Additional surgical clips in the left upper quadrant cardiomediastinal contours are unchanged from prior. No acute osseous or soft tissue abnormality. Degenerative changes are present in the imaged spine and shoulders. Telemetry leads overlie the chest. IMPRESSION: 1. Some increasing medial basilar opacities bilaterally may reflect worsening atelectasis or developing infection. 2. Chronic elevation of the right hemidiaphragm with some adjacent scarring and or atelectasis. Electronically Signed   By: Coralie Keens.D.  On: 05/12/2020 03:09   ECHOCARDIOGRAM COMPLETE  Result Date: 05/14/2020    ECHOCARDIOGRAM REPORT   Patient Name:   CORLEONE BIEGLER Voong Date of Exam: 05/19/2020 Medical Rec #:  [id number]        Height:       69.0 in Accession #:    [id number]       Weight:       220.0 lb Date of Birth:  17-Jul-1932        BSA:          2.151 m Patient Age:    86 years         BP:           162/73 mmHg Patient Gender: M                HR:           75 bpm. Exam Location:  [facility] Procedure: 2D Echo, Cardiac Doppler and Color Doppler Indications:     TIA 435.9  History:         Patient has no prior history of Echocardiogram examinations.                  Stroke; Risk Factors:Hypertension and Diabetes.  Sonographer:     Cristela Blue RDCS (AE) Referring Phys:  [id number] Vernetta Honey MANSY Diagnosing Phys: Cristal Deer End MD  Sonographer Comments: Technically difficult study due to poor echo windows, no apical window and suboptimal parasternal window. Image acquisition challenging due to respiratory motion. IMPRESSIONS  1. Left ventricular ejection fraction, by estimation, is >55%. The left ventricle has normal function. Left ventricular endocardial border not optimally defined to evaluate regional wall motion.  There is mild left ventricular hypertrophy. Left ventricular diastolic function could not be evaluated.  2. Right ventricular systolic function was not well visualized. The right ventricular size is not well visualized.  3. The mitral valve is grossly normal. Unable to accurately assess mitral valve regurgitation.  4. Tricuspid valve regurgitation not well assessed.  5. The aortic valve was not well visualized. Aortic valve regurgitation not well assessed.  6. Pulmonic valve regurgitation not well assessed. FINDINGS  Left Ventricle: Left ventricular ejection fraction, by estimation, is >55%. The left ventricle has normal function. Left ventricular endocardial border not optimally defined to evaluate regional wall motion. The left ventricular internal cavity size was  normal in size. There is mild left ventricular hypertrophy. Left ventricular diastolic function could not be evaluated. Right Ventricle: The right ventricular size is not well visualized. Right vetricular wall thickness was not assessed. Right ventricular systolic function was not well visualized. Left Atrium: Left atrial size was not well visualized. Right Atrium: Right atrial size was not well visualized. Pericardium: The pericardium was not well visualized. Mitral Valve: The mitral valve is grossly normal. Unable to accurately assess mitral valve regurgitation. Tricuspid Valve: The tricuspid valve is not well visualized. Tricuspid valve regurgitation not well assessed. Aortic Valve: The aortic valve was not well visualized. Aortic valve regurgitation not well assessed. Pulmonic Valve: The pulmonic valve was not well visualized. Pulmonic valve regurgitation not well assessed. Aorta: The aortic root is normal in size and structure. Venous: The inferior vena cava was not well visualized. IAS/Shunts: The interatrial septum was not well visualized.  LEFT VENTRICLE PLAX 2D LVIDd:         3.74 cm LVIDs:         2.10 cm LV PW:         1.31 cm LV  IVS:         1.34 cm LVOT diam:     2.10 cm LVOT Area:     3.46 cm  LEFT ATRIUM         Index LA diam:    2.90 cm 1.35 cm/m   AORTA Ao Root diam: 3.50 cm  SHUNTS Systemic Diam: 2.10 cm Yvonne Kendall MD Electronically signed by Yvonne Kendall MD Signature Date/Time: 05/30/2020/3:22:04 PM    Final     Microbiology Recent Results (from the past 240 hour(s))  SARS Coronavirus 2 by RT PCR (hospital order, performed in [facility] hospital lab) Nasopharyngeal Nasopharyngeal Swab     Status: None   Collection Time: 06/06/2020  4:49 AM   Specimen: Nasopharyngeal Swab  Result Value Ref Range Status   SARS Coronavirus 2 NEGATIVE NEGATIVE Final    Comment: (NOTE) SARS-CoV-2 target nucleic acids are NOT DETECTED.  The SARS-CoV-2 RNA is generally detectable in upper and lower respiratory specimens during the acute phase of infection. The lowest concentration of SARS-CoV-2 viral copies this assay can detect is 250 copies / mL. A negative result does not preclude SARS-CoV-2 infection and should not be used as the sole basis for treatment or other patient management decisions.  A negative result may occur with improper specimen collection / handling, submission of specimen other than nasopharyngeal swab, presence of viral mutation(s) within the areas targeted by this assay, and inadequate number of viral copies (<250 copies / mL). A negative result must be combined with clinical observations, patient history, and epidemiological information.  Fact Sheet for Patients:   BoilerBrush.com.cy  Fact Sheet for Healthcare Providers: https://pope.com/  This test is not yet approved or  cleared by the [place] FDA and has been authorized for detection and/or diagnosis of SARS-CoV-2 by FDA under an Emergency Use Authorization (EUA).  This EUA will remain in effect (meaning this test can be used) for the duration of the COVID-19 declaration under Section 564(b)(1) of  the Act, 21 U.S.C. section 360bbb-3(b)(1), unless the authorization is terminated or revoked sooner.  Performed at [facility], [address] [place], [place] [address]   CULTURE, BLOOD (ROUTINE X 2) w Reflex to ID Panel     Status: None   Collection Time: 05/29/20  4:15 PM   Specimen: BLOOD  Result Value Ref Range Status   Specimen Description BLOOD BLOOD RIGHT HAND  Final   Special Requests   Final    BOTTLES DRAWN AEROBIC AND ANAEROBIC Blood Culture adequate volume   Culture   Final    NO GROWTH 5 DAYS Performed at [facility], [address] [place], [place] [address]    Report Status 06/12/2020 FINAL  Final  CULTURE, BLOOD (ROUTINE X 2) w Reflex to ID Panel     Status: None   Collection Time: 05/29/20  4:29 PM   Specimen: BLOOD  Result Value Ref Range Status   Specimen Description BLOOD BLOOD LEFT HAND  Final   Special Requests   Final    BOTTLES DRAWN AEROBIC AND ANAEROBIC Blood Culture adequate volume   Culture   Final    NO GROWTH 5 DAYS Performed at [facility], [address], [place], [place] [address]    Report Status 06/12/2020 FINAL  Final    Lab Basic Metabolic Panel: Recent Labs  Lab 05/27/20 1722 05/29/20 0434 05/31/20 0451 06/01/20 0546 06/02/20 0703  NA 142 144 141 142 143  K 4.2 4.0 3.7 3.5 3.7  CL 103 107 109 105 106  CO2 GLUCOSE 139* 106* 99 93 105*  BUN 24* CREATININE 1.10 1.00 0.99 1.10 1.31*  CALCIUM 9.1 9.2 8.8* 8.9 8.9   Liver Function Tests: Recent Labs  Lab 05/27/20 1722 05/31/20 0451  AST 29 28  ALT 25 20  ALKPHOS 74 64  BILITOT 0.9 0.9  PROT 6.5 6.0*  ALBUMIN 3.1* 2.8*   CBC: Recent Labs  Lab 05/27/20 1722 05/29/20 0434 05/31/20 0451 06/01/20 0546 06/02/20 0703  WBC 11.1* 8.5 8.5 10.0 17.2*  HGB 12.6* 10.7* 10.7* 12.0* 11.5*  HCT 39.7 34.0* 33.5* 36.1* 36.3*  MCV 91.5 93.2 90.5 87.0 91.2  PLT 127* 98* 81* 77* 77*   Cardiac Enzymes: Recent Labs   Lab 06/01/20 0546  CKTOTAL 57   Sepsis Labs: Recent Labs  Lab 05/27/20 1722 06/02/20 0316 02-Jun-2020 0449 05/29/20 0434 05/31/20 0451 06/01/20 0546 06/02/20 0703  PROCALCITON  --  <0.10  --   --  <0.10  --   --   WBC   < >  --   --  8.5 8.5 10.0 17.2*  LATICACIDVEN  --   --  0.8  --   --   --   --    < > = values in this interval not displayed.     Zyliah Schier [employer] 05/14/2020, 3:19 PM

## 2020-06-12 NOTE — Progress Notes (Signed)
CH paged after Pt's passing. RN let Ch know that family is not around currently. Ch requested to be paged after family arrives.

## 2020-06-12 DEATH — deceased
